# Patient Record
Sex: Male | Born: 1940 | Race: Black or African American | Hispanic: No | Marital: Single | Smoking: Former smoker
Health system: Southern US, Community
[De-identification: ages and names within clinical notes are randomized; demographics above are authoritative.]

## PROBLEM LIST (undated history)

## (undated) DIAGNOSIS — K219 Gastro-esophageal reflux disease without esophagitis: Secondary | ICD-10-CM

## (undated) DIAGNOSIS — G459 Transient cerebral ischemic attack, unspecified: Secondary | ICD-10-CM

## (undated) DIAGNOSIS — R519 Headache, unspecified: Secondary | ICD-10-CM

## (undated) DIAGNOSIS — R531 Weakness: Secondary | ICD-10-CM

## (undated) DIAGNOSIS — R413 Other amnesia: Secondary | ICD-10-CM

## (undated) DIAGNOSIS — C20 Malignant neoplasm of rectum: Secondary | ICD-10-CM

## (undated) DIAGNOSIS — R51 Headache: Secondary | ICD-10-CM

## (undated) DIAGNOSIS — I10 Essential (primary) hypertension: Secondary | ICD-10-CM

## (undated) DIAGNOSIS — I2510 Atherosclerotic heart disease of native coronary artery without angina pectoris: Secondary | ICD-10-CM

## (undated) DIAGNOSIS — M1990 Unspecified osteoarthritis, unspecified site: Secondary | ICD-10-CM

## (undated) HISTORY — DX: Malignant neoplasm of rectum: C20

## (undated) HISTORY — DX: Weakness: R53.1

## (undated) HISTORY — DX: Other amnesia: R41.3

## (undated) HISTORY — DX: Headache: R51

## (undated) HISTORY — PX: OTHER SURGICAL HISTORY: SHX169

## (undated) HISTORY — DX: Headache, unspecified: R51.9

## (undated) HISTORY — DX: Atherosclerotic heart disease of native coronary artery without angina pectoris: I25.10

---

## 1998-07-13 ENCOUNTER — Emergency Department (HOSPITAL_COMMUNITY): Admission: EM | Admit: 1998-07-13 | Discharge: 1998-07-13 | Payer: Self-pay | Admitting: Emergency Medicine

## 1998-07-13 ENCOUNTER — Encounter: Payer: Self-pay | Admitting: Emergency Medicine

## 1998-07-18 ENCOUNTER — Emergency Department (HOSPITAL_COMMUNITY): Admission: EM | Admit: 1998-07-18 | Discharge: 1998-07-18 | Payer: Self-pay | Admitting: Emergency Medicine

## 1998-07-18 ENCOUNTER — Encounter: Payer: Self-pay | Admitting: Emergency Medicine

## 2007-07-31 ENCOUNTER — Encounter: Admission: RE | Admit: 2007-07-31 | Discharge: 2007-07-31 | Payer: Self-pay | Admitting: Gastroenterology

## 2007-08-23 ENCOUNTER — Ambulatory Visit (HOSPITAL_COMMUNITY): Admission: RE | Admit: 2007-08-23 | Discharge: 2007-08-23 | Payer: Self-pay | Admitting: General Surgery

## 2007-08-23 ENCOUNTER — Encounter (INDEPENDENT_AMBULATORY_CARE_PROVIDER_SITE_OTHER): Payer: Self-pay | Admitting: General Surgery

## 2008-04-03 ENCOUNTER — Emergency Department (HOSPITAL_COMMUNITY): Admission: EM | Admit: 2008-04-03 | Discharge: 2008-04-04 | Payer: Self-pay | Admitting: Emergency Medicine

## 2008-11-04 ENCOUNTER — Encounter: Admission: RE | Admit: 2008-11-04 | Discharge: 2008-11-04 | Payer: Self-pay | Admitting: Family Medicine

## 2010-03-06 ENCOUNTER — Emergency Department (HOSPITAL_COMMUNITY): Admission: EM | Admit: 2010-03-06 | Discharge: 2010-03-06 | Payer: Self-pay | Admitting: Emergency Medicine

## 2010-07-03 ENCOUNTER — Encounter: Payer: Self-pay | Admitting: Gastroenterology

## 2010-08-25 LAB — URINALYSIS, ROUTINE W REFLEX MICROSCOPIC
Bilirubin Urine: NEGATIVE
Hgb urine dipstick: NEGATIVE
Ketones, ur: NEGATIVE mg/dL
Nitrite: NEGATIVE
Urobilinogen, UA: 0.2 mg/dL (ref 0.0–1.0)

## 2010-08-25 LAB — CBC
HCT: 41.1 % (ref 39.0–52.0)
MCH: 29 pg (ref 26.0–34.0)
MCHC: 32.5 g/dL (ref 30.0–36.0)
MCV: 89.1 fL (ref 78.0–100.0)
RDW: 15.4 % (ref 11.5–15.5)

## 2010-08-25 LAB — DIFFERENTIAL
Basophils Absolute: 0 10*3/uL (ref 0.0–0.1)
Basophils Relative: 0 % (ref 0–1)
Eosinophils Relative: 4 % (ref 0–5)
Monocytes Absolute: 1 10*3/uL (ref 0.1–1.0)
Monocytes Relative: 15 % — ABNORMAL HIGH (ref 3–12)

## 2010-08-25 LAB — BASIC METABOLIC PANEL
BUN: 5 mg/dL — ABNORMAL LOW (ref 6–23)
CO2: 28 mEq/L (ref 19–32)
Chloride: 105 mEq/L (ref 96–112)
Glucose, Bld: 120 mg/dL — ABNORMAL HIGH (ref 70–99)
Potassium: 4 mEq/L (ref 3.5–5.1)

## 2010-08-25 LAB — POCT CARDIAC MARKERS: CKMB, poc: 1.8 ng/mL (ref 1.0–8.0)

## 2010-10-25 NOTE — Op Note (Signed)
NAME:  Andre [name], Andre [name]                 ACCOUNT NO.:  [id number]   MEDICAL RECORD NO.:  [id number]          PATIENT TYPE:  AMB   LOCATION:  SDS                          FACILITY:  [facility]   PHYSICIAN:  Cherylynn Ridges, M.D.    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   PROCEDURE:  1. Transanal excision of polypectomy site and retained polyp.  2. Rigid sigmoidoscopy.   SURGEON:  Dr. Lindie Spruce.   ANESTHESIA:  General endotracheal was done in the jackknife prone  position.   COMPLICATIONS:  None.   CONDITION:  Stable.   INDICATIONS FOR OPERATION:  The patient is a 70 year old gentleman who  on routine colonoscopy was found to have an anorectal polyp which was  excised and subsequent pathology demonstrated adenocarcinoma, who comes  in now for re-excision of that area.   FINDINGS:  The patient had slightly pigmented mucosa anteriorly and a  small 3 mm polyp just beyond or proximal to the area of pigmentation,  likely retained polyp from previous excision.   OPERATION:  The patient was taken to the operating room, placed on the  table in supine position.  After adequate endotracheal anesthetic was  administered, he was placed in the jackknife prone position.  The  gluteal cheeks were spread apart and he was prepped and draped in usual  sterile manner using Betadine.   An anal speculum was inserted into the anus and we inspected the area  circumferentially, where the anterior mucosal polyp was noted and also  the pigmentation from previous injection.  We grabbed the area of the  polyp with a Allis clamp and then just beyond it placed a 3-0 chromic  Vicryl stitch which  was used to reapproximate the mucosa after we  excised the area with electrocautery.  Hemostasis was obtained with  electrocautery and also a running locking stitch of 3-0 Vicryl and also  intermittent figure-of-eight simple stitches of 3-0 chromic.  Once the  mucosa was removed and the polyp was noted, we marked the proximal  margin using a Vicryl stitch.  We inspected  for adequate hemostasis.  There was good hemostasis noted.  We placed  dibucaine ointment soaked Gelfoam into the anorectal area with 4x4 gauze  and an ABD pad.  All counts were correct.  The patient was taken to  recovery room in stable condition.      Cherylynn Ridges, M.D.  Electronically Signed     JOW/MEDQ  D:  08/23/2007  T:  08/24/2007  Job:  [id number]

## 2011-02-26 ENCOUNTER — Emergency Department (INDEPENDENT_AMBULATORY_CARE_PROVIDER_SITE_OTHER): Payer: Medicare Other

## 2011-02-26 ENCOUNTER — Emergency Department (HOSPITAL_BASED_OUTPATIENT_CLINIC_OR_DEPARTMENT_OTHER): Payer: Medicare Other

## 2011-02-26 ENCOUNTER — Emergency Department (HOSPITAL_BASED_OUTPATIENT_CLINIC_OR_DEPARTMENT_OTHER)
Admission: EM | Admit: 2011-02-26 | Discharge: 2011-02-26 | Disposition: A | Discharge status: Home / Self Care | Payer: Medicare Other | Attending: Emergency Medicine | Admitting: Emergency Medicine

## 2011-02-26 ENCOUNTER — Encounter: Payer: Self-pay | Admitting: *Deleted

## 2011-02-26 DIAGNOSIS — IMO0002 Reserved for concepts with insufficient information to code with codable children: Secondary | ICD-10-CM

## 2011-02-26 DIAGNOSIS — Z9181 History of falling: Secondary | ICD-10-CM

## 2011-02-26 DIAGNOSIS — W19XXXA Unspecified fall, initial encounter: Secondary | ICD-10-CM

## 2011-02-26 DIAGNOSIS — E119 Type 2 diabetes mellitus without complications: Secondary | ICD-10-CM | POA: Insufficient documentation

## 2011-02-26 DIAGNOSIS — M25569 Pain in unspecified knee: Secondary | ICD-10-CM

## 2011-02-26 DIAGNOSIS — S0003XA Contusion of scalp, initial encounter: Secondary | ICD-10-CM | POA: Insufficient documentation

## 2011-02-26 DIAGNOSIS — F17200 Nicotine dependence, unspecified, uncomplicated: Secondary | ICD-10-CM | POA: Insufficient documentation

## 2011-02-26 DIAGNOSIS — T07XXXA Unspecified multiple injuries, initial encounter: Secondary | ICD-10-CM

## 2011-02-26 DIAGNOSIS — Z8739 Personal history of other diseases of the musculoskeletal system and connective tissue: Secondary | ICD-10-CM | POA: Insufficient documentation

## 2011-02-26 DIAGNOSIS — I679 Cerebrovascular disease, unspecified: Secondary | ICD-10-CM

## 2011-02-26 DIAGNOSIS — G319 Degenerative disease of nervous system, unspecified: Secondary | ICD-10-CM | POA: Insufficient documentation

## 2011-02-26 DIAGNOSIS — I10 Essential (primary) hypertension: Secondary | ICD-10-CM | POA: Insufficient documentation

## 2011-02-26 DIAGNOSIS — S1093XA Contusion of unspecified part of neck, initial encounter: Secondary | ICD-10-CM | POA: Insufficient documentation

## 2011-02-26 DIAGNOSIS — J329 Chronic sinusitis, unspecified: Secondary | ICD-10-CM

## 2011-02-26 HISTORY — DX: Unspecified osteoarthritis, unspecified site: M19.90

## 2011-02-26 HISTORY — DX: Essential (primary) hypertension: I10

## 2011-02-26 MED ORDER — OXYCODONE-ACETAMINOPHEN 5-325 MG PO TABS
2.0000 | ORAL_TABLET | Freq: Once | ORAL | Status: AC
  Administered 2011-02-26: 2 via ORAL
  Filled 2011-02-26: qty 2

## 2011-02-26 MED ORDER — CEPHALEXIN 500 MG PO CAPS: 500.0000 mg | ORAL_CAPSULE | Freq: Four times a day (QID) | ORAL | Status: AC

## 2011-02-26 NOTE — ED Notes (Signed)
Patient fell down a hill yesterday, no loc, hit mouth & R knee, lip swollen, forehead pain

## 2011-02-26 NOTE — ED Provider Notes (Signed)
History     CSN: [id number] Arrival date & time: 02/26/2011  7:33 AM   Chief Complaint  Patient presents with  . Fall     (Include location/radiation/quality/duration/timing/severity/associated sxs/prior treatment) HPI  Andre [name] arises a 70 year old man who fell outside yesterday. He has abrasion and contusion to his nasal bridge and his upper lip. He is also still complaining of some pain in his right knee. He denies loss of consciousness but was drinking alcohol does prior to this event. He complains chiefly of pain in his upper lip. He denies any loss of teeth. He states there was some bleeding of his upper lip yesterday and through the night. His last tetanus shot was approximately one year ago. He currently does not have a headache or lateralized deficits. He has been walking on his knees. Past Medical History  Diagnosis Date  . Arthritis   . Hypertension   . Diabetes mellitus     Reviewed History reviewed. No pertinent past surgical history.  No family history on file.  History  Substance Use Topics  . Smoking status: Current Everyday Smoker  . Smokeless tobacco: Not on file  . Alcohol Use: Yes     the patient was recently released from police custody. He had not used alcohol for 18 months prior to this event and began smoking cigarettes again yesterday.  Review of Systems  All other systems reviewed and are negative.    Allergies  Review of patient's allergies indicates no known allergies.  Home Medications  No current outpatient prescriptions on file.  Physical Exam    BP 139/72  Pulse 91  Temp(Src) 98.1 F (36.7 C) (Oral)  Resp 19  SpO2 96%  Physical Exam Well-developed well-nourished male sitting on the bed who does not appear to be in any acute distress the above vital signs were reviewed and are within normal limits HEENT there is no trauma noted to the scalp or deformity of the skull noted. There is an abrasion over the bridge of the nose. The  external nares are patent. The nasal mucosa is examined and there is no evidence of nasal bone fracture and no septal hematoma. The upper lip is diffusely swollen and tender with palpation. There are 2-3 abrasions which appear to have been consistent with teeth marks on the mucosa. There is no laceration noted underlying these times I attempted to explore it with a Q-tip. The patient has very poor dentition but no appearance of recently lost teeth. The patient has an abrasion is somewhat tender over the right forehead. Eyes pupils are equal round react to light with extraocular movements intact Neck is nontender over the cervical spine he has full active range of movement trachea is midline and carotid pulses are 2+ bilaterally. Chest wall no signs of trauma no crepitus palpated Lungs are clear to auscultation Abdomen is soft nontender no signs of trauma are noted Back exam there is no tenderness to palpation over the cervical thoracic or lumbar spine Extremities there is an abrasion over the right knee with mild tenderness. He is full active range of motion of the right knee. No ligamentous laxity is noted. Dorsal patellas pulses are present and equal bilaterally Neurologically patient is alert and oriented x3 with equal strength throughout. ED Course  Procedures  Results for orders placed during the hospital encounter of 03/06/10  BASIC METABOLIC PANEL      Component Value Range   Sodium 138  135 - 145 (mEq/L)   Potassium 4.0  3.5 - 5.1 (mEq/L)   Chloride 105  96 - 112 (mEq/L)   CO2 28  19 - 32 (mEq/L)   Glucose, Bld 120 (*) 70 - 99 (mg/dL)   BUN 5 (*) 6 - 23 (mg/dL)   Creatinine, Ser 0.45  0.4 - 1.5 (mg/dL)   Calcium 9.5  8.4 - 40.9 (mg/dL)   GFR calc non Af Amer >60  >60 (mL/min)   GFR calc Af Amer    >60 (mL/min)   Value: >60            The eGFR has been calculated     using the MDRD equation.     This calculation has not been     validated in all clinical     situations.      eGFR's persistently     <60 mL/min signify     possible Chronic Kidney Disease.  CBC      Component Value Range   WBC 6.3  4.0 - 10.5 (K/uL)   RBC 4.62  4.22 - 5.81 (MIL/uL)   Hemoglobin 13.4  13.0 - 17.0 (g/dL)   HCT 81.1  91.4 - 78.2 (%)   MCV 89.1  78.0 - 100.0 (fL)   MCH 29.0  26.0 - 34.0 (pg)   MCHC 32.5  30.0 - 36.0 (g/dL)   RDW 95.6  21.3 - 08.6 (%)   Platelets 236  150 - 400 (K/uL)  DIFFERENTIAL      Component Value Range   Neutrophils Relative 38 (*) 43 - 77 (%)   Neutro Abs 2.4  1.7 - 7.7 (K/uL)   Lymphocytes Relative 42  12 - 46 (%)   Lymphs Abs 2.7  0.7 - 4.0 (K/uL)   Monocytes Relative 15 (*) 3 - 12 (%)   Monocytes Absolute 1.0  0.1 - 1.0 (K/uL)   Eosinophils Relative 4  0 - 5 (%)   Eosinophils Absolute 0.3  0.0 - 0.7 (K/uL)   Basophils Relative 0  0 - 1 (%)   Basophils Absolute 0.0  0.0 - 0.1 (K/uL)  URINALYSIS, ROUTINE W REFLEX MICROSCOPIC      Component Value Range   Color, Urine YELLOW  YELLOW    Appearance CLEAR  CLEAR    Specific Gravity, Urine >1.030 (*) 1.005 - 1.030    pH 5.5  5.0 - 8.0    Glucose, UA NEGATIVE  NEGATIVE (mg/dL)   Hgb urine dipstick NEGATIVE  NEGATIVE    Bilirubin Urine NEGATIVE  NEGATIVE    Ketones, ur NEGATIVE  NEGATIVE (mg/dL)   Protein, ur NEGATIVE  NEGATIVE (mg/dL)   Urobilinogen, UA 0.2  0.0 - 1.0 (mg/dL)   Nitrite NEGATIVE  NEGATIVE    Leukocytes, UA    NEGATIVE    Value: NEGATIVE MICROSCOPIC NOT DONE ON URINES WITH NEGATIVE PROTEIN, BLOOD, LEUKOCYTES, NITRITE, OR GLUCOSE <1000 mg/dL.  POCT CARDIAC MARKERS      Component Value Range   Myoglobin, poc 75.0  12 - 200 (ng/mL)   CKMB, poc 1.8  1.0 - 8.0 (ng/mL)   Troponin i, poc <0.05  0.00 - 0.09 (ng/mL)   Comment       Value:            TROPONIN VALUES IN THE RANGE     OF 0.00-0.09 ng/mL SHOW     NO INDICATION OF     MYOCARDIAL INJURY.                PERSISTENTLY INCREASED TROPONIN  VALUES IN THE RANGE OF 0.10-0.24     ng/mL CAN BE SEEN IN:           -UNSTABLE ANGINA            -CONGESTIVE HEART FAILURE           -MYOCARDITIS           -CHEST TRAUMA           -ARRYHTHMIAS           -LATE PRESENTING MI           -COPD       CLINICAL FOLLOW-UP RECOMMENDED.                TROPONIN VALUES >=0.25 ng/mL     INDICATE POSSIBLE MYOCARDIAL     ISCHEMIA. SERIAL TESTING     RECOMMENDED.   No results found.   No diagnosis found.   MDM  Patient is having a CT of the head and maxillofacial bones x-Lavere Shinsky of the right knee.    Ct Head Wo Contrast  02/26/2011  *RADIOLOGY REPORT*  Clinical Data:  The patient fell last night, striking face and head on ground.  No loss of consciousness.  Abrasions, lacerations of nose, face, forehead.  History of hypertension.  CT HEAD WITHOUT CONTRAST CT MAXILLOFACIAL WITHOUT CONTRAST  Technique:  Multidetector CT imaging of the head and maxillofacial structures were performed using the standard protocol without intravenous contrast. Multiplanar CT image reconstructions of the maxillofacial structures were also generated.  Comparison:  04/04/2008  CT HEAD  Findings: There is mild central cortical atrophy.  Periventricular white matter changes are consistent with small vessel disease. Small lacunar infarct is identified within the pons, consistent with chronic process. There is no evidence for hemorrhage, mass lesion, or acute infarction.  Bone windows are unremarkable.  IMPRESSION:  1.  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 3.  No evidence for acute intracranial abnormality.  CT MAXILLOFACIAL  Findings:   The nasal bones, zygomatic arches, temporal mandibular joints are intact.  The mandible, orbits, globes, pterygoid plates are intact.  The visualized portion of the cervical spine is intact but does show significant degenerative change. There is mild mucoperiosteal thickening of the ethmoid sinuses.  IMPRESSION:  1.  No evidence for acute facial fracture. 2.  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 3.   Mild, chronic sinusitis.  Original Report Authenticated By: Patterson Hammersmith, M.D.   Dg Knee Complete 4 Views Right  02/26/2011  *RADIOLOGY REPORT*  Clinical Data: Larey Seat down yesterday, hit right knee  RIGHT KNEE - COMPLETE 4+ VIEW  Comparison: None.  Findings: No fracture or dislocation.  Mild degenerative change primarily involving the medial compartment and patellofemoral joint with joint space loss, subchondral sclerosis and osteophytosis.  No evidence of chondrocalcinosis.  No definite joint effusion. Enthesopathic change of the quadriceps tendon insertion site. Vascular calcifications.  IMPRESSION: 1.  No fracture or dislocation. 2.  Mild degenerative change of the knee.  Original Report Authenticated By: Waynard Reeds, M.D.   Ct Maxillofacial Wo Cm  02/26/2011  *RADIOLOGY REPORT*  Clinical Data:  The patient fell last night, striking face and head on ground.  No loss of consciousness.  Abrasions, lacerations of nose, face, forehead.  History of hypertension.  CT HEAD WITHOUT CONTRAST CT MAXILLOFACIAL WITHOUT CONTRAST  Technique:  Multidetector CT imaging of the head and maxillofacial structures were performed using the standard protocol without intravenous contrast. Multiplanar  CT image reconstructions of the maxillofacial structures were also generated.  Comparison:  04/04/2008  CT HEAD  Findings: There is mild central cortical atrophy.  Periventricular white matter changes are consistent with small vessel disease. Small lacunar infarct is identified within the pons, consistent with chronic process. There is no evidence for hemorrhage, mass lesion, or acute infarction.  Bone windows are unremarkable.  IMPRESSION:  1.  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 3.  No evidence for acute intracranial abnormality.  CT MAXILLOFACIAL  Findings:   The nasal bones, zygomatic arches, temporal mandibular joints are intact.  The mandible, orbits, globes, pterygoid plates are intact.  The  visualized portion of the cervical spine is intact but does show significant degenerative change. There is mild mucoperiosteal thickening of the ethmoid sinuses.  IMPRESSION:  1.  No evidence for acute facial fracture. 2.  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 3.  Mild, chronic sinusitis.  Original Report Authenticated By: Patterson Hammersmith, M.D.     Hilario Quarry, MD 02/26/11 [phone]

## 2011-03-06 LAB — COMPREHENSIVE METABOLIC PANEL
ALT: 50
AST: 47 — ABNORMAL HIGH
Alkaline Phosphatase: 65
CO2: 31
Calcium: 9.5
Chloride: 99
GFR calc Af Amer: >60
GFR calc non Af Amer: >60
Glucose, Bld: 103 — ABNORMAL HIGH
Sodium: 138
Total Bilirubin: 0.8

## 2011-03-06 LAB — CBC
Hemoglobin: 14.9
MCHC: 34.2
RBC: 4.5
WBC: 6.4

## 2011-03-06 LAB — DIFFERENTIAL
Basophils Absolute: 0.1
Basophils Relative: 1
Eosinophils Absolute: 0.1
Eosinophils Relative: 2
Lymphs Abs: 2.2
Neutrophils Relative %: 46

## 2011-03-16 ENCOUNTER — Encounter (HOSPITAL_BASED_OUTPATIENT_CLINIC_OR_DEPARTMENT_OTHER): Payer: Self-pay | Admitting: Family Medicine

## 2011-03-16 ENCOUNTER — Emergency Department (HOSPITAL_BASED_OUTPATIENT_CLINIC_OR_DEPARTMENT_OTHER)
Admission: EM | Admit: 2011-03-16 | Discharge: 2011-03-16 | Disposition: A | Discharge status: Home / Self Care | Payer: Self-pay | Attending: Emergency Medicine | Admitting: Emergency Medicine

## 2011-03-16 DIAGNOSIS — Z79899 Other long term (current) drug therapy: Secondary | ICD-10-CM | POA: Insufficient documentation

## 2011-03-16 DIAGNOSIS — I10 Essential (primary) hypertension: Secondary | ICD-10-CM | POA: Insufficient documentation

## 2011-03-16 DIAGNOSIS — Z8739 Personal history of other diseases of the musculoskeletal system and connective tissue: Secondary | ICD-10-CM | POA: Insufficient documentation

## 2011-03-16 DIAGNOSIS — M542 Cervicalgia: Secondary | ICD-10-CM | POA: Insufficient documentation

## 2011-03-16 DIAGNOSIS — E119 Type 2 diabetes mellitus without complications: Secondary | ICD-10-CM | POA: Insufficient documentation

## 2011-03-16 MED ORDER — HYDROCODONE-ACETAMINOPHEN 5-325 MG PO TABS
1.0000 | ORAL_TABLET | Freq: Once | ORAL | Status: AC
  Administered 2011-03-16: 1 via ORAL
  Filled 2011-03-16: qty 1

## 2011-03-16 MED ORDER — IBUPROFEN 800 MG PO TABS
800.0000 mg | ORAL_TABLET | Freq: Once | ORAL | Status: AC
  Administered 2011-03-16: 800 mg via ORAL
  Filled 2011-03-16: qty 1

## 2011-03-16 MED ORDER — HYDROCODONE-ACETAMINOPHEN 5-325 MG PO TABS: 1.0000 | ORAL_TABLET | Freq: Three times a day (TID) | ORAL | Status: AC | PRN

## 2011-03-16 MED ORDER — DIAZEPAM 5 MG PO TABS
5.0000 mg | ORAL_TABLET | Freq: Once | ORAL | Status: AC
  Administered 2011-03-16: 5 mg via ORAL
  Filled 2011-03-16: qty 1

## 2011-03-16 MED ORDER — DIAZEPAM 5 MG PO TABS: 5.0000 mg | ORAL_TABLET | Freq: Every evening | ORAL | Status: AC | PRN

## 2011-03-16 MED ORDER — IBUPROFEN 800 MG PO TABS: 800.0000 mg | ORAL_TABLET | Freq: Three times a day (TID) | ORAL | Status: AC

## 2011-03-16 NOTE — ED Notes (Addendum)
Pt sts he fell over a stump 2 wks ago and was evaluated afterwards. [name] bilateral neck and shoulder "soreness that won't go away". Pt also c/o bilateral eye discomfort.

## 2011-03-16 NOTE — ED Provider Notes (Signed)
History     CSN: [id number] Arrival date & time: 03/16/2011  8:13 AM  Chief Complaint  Patient presents with  . Neck Pain    (Consider location/radiation/quality/duration/timing/severity/associated sxs/prior treatment) HPI  The patient presents with neck discomfort. If symptoms began approximately 2 weeks ago after a mechanical fall for which he was evaluated in this emergency department with negative imaging. He notes that since the fall he has had persistent discomfort about his entire posterior neck. Discomfort described as a soreness and tightness, not relieved by anything, seemingly worse at night. He denies lightheadedness, nausea, chest pain, shortness of breath, visual changes, extremity numbness weakness or tingling, or any gait changes. He notes that the discomfort is most prominent at night, interfering with his capacity to sleep. Past Medical History  Diagnosis Date  . Arthritis   . Hypertension   . Diabetes mellitus     History reviewed. No pertinent past surgical history.  No family history on file.  History  Substance Use Topics  . Smoking status: Current Everyday Smoker    Types: Cigarettes  . Smokeless tobacco: Not on file  . Alcohol Use: Yes      Review of Systems  Constitutional: Negative for fever and chills.  HENT: Positive for neck stiffness. Negative for sore throat.   Eyes: Negative for visual disturbance.  Respiratory: Negative for shortness of breath.   Cardiovascular: Negative for chest pain.  Gastrointestinal: Negative for nausea and abdominal pain.  Genitourinary: Negative for dysuria.  Musculoskeletal: Negative for myalgias.  Neurological: Negative for headaches.  Psychiatric/Behavioral: Negative.     Allergies  Review of patient's allergies indicates no known allergies.  Home Medications   Current Outpatient Rx  Name Route Sig Dispense Refill  . CARBAMAZEPINE 200 MG PO TABS Oral Take 200 mg by mouth 3 (three) times daily.      .  CHLORTHALIDONE 25 MG PO TABS Oral Take 25 mg by mouth daily.      Marland Kitchen DOCUSATE SODIUM 100 MG PO CAPS Oral Take 100 mg by mouth 2 (two) times daily.      Marland Kitchen GLIPIZIDE 10 MG PO TABS Oral Take 10 mg by mouth 2 (two) times daily before a meal.      . GLUCOSAMINE HCL 750 MG PO TABS Oral Take by mouth.      Marland Kitchen LISINOPRIL 10 MG PO TABS Oral Take 10 mg by mouth daily.      Marland Kitchen METFORMIN HCL 500 MG PO TABS Oral Take 500 mg by mouth 2 (two) times daily with a meal.      . METOPROLOL SUCCINATE 25 MG PO TB24 Oral Take 25 mg by mouth daily.        BP 146/68  Pulse 58  Temp(Src) 97.7 F (36.5 C) (Oral)  Resp 16  Ht 5\' 11"  (1.803 m)  Wt 212 lb (96.163 kg)  BMI 29.57 kg/m2  SpO2 100%  Physical Exam  Constitutional: He is oriented to person, place, and time. He appears well-developed and well-nourished.  HENT:  Head: Normocephalic and atraumatic.  Eyes: Conjunctivae are normal. Pupils are equal, round, and reactive to light.  Neck: Normal range of motion. Neck supple. No thyromegaly present.       No appreciable deformity. Range of motion is appropriate. No lymphadenopathy.  Cardiovascular: Normal rate and regular rhythm.   Pulmonary/Chest: No respiratory distress.  Abdominal: Soft. There is no tenderness.  Musculoskeletal: He exhibits no edema.  Neurological: He is alert and oriented to person, place, and time.  Skin: Skin is warm and dry.  Psychiatric: He has a normal mood and affect.    ED Course  Procedures (including critical care time)  Labs Reviewed - No data to display No results found.   No diagnosis found.    MDM  This elderly gentleman presents with ongoing neck discomfort, which seemingly began following a fall. The patient's physical exam and complaints are most consistent with a musculoskeletal etiology. Absent focal findings on his tire CAT scans, or any restriction to range of motion, or any palpable defect, muscle strain is the most likely cause of his symptoms. He will  receive oral medications and be reassessed. Follow up care was discussed with the patient, and he will be provided information for local clinics.   The patient notes that he is feeling appreciably better. He'll be discharged home with prescriptions for analgesia.     Gerhard Munch, MD 03/16/11 [phone]

## 2011-04-17 ENCOUNTER — Emergency Department (HOSPITAL_COMMUNITY): Payer: Medicare Other

## 2011-04-17 ENCOUNTER — Encounter (HOSPITAL_COMMUNITY): Payer: Self-pay | Admitting: *Deleted

## 2011-04-17 ENCOUNTER — Encounter (HOSPITAL_COMMUNITY): Payer: Self-pay | Admitting: Emergency Medicine

## 2011-04-17 ENCOUNTER — Emergency Department (HOSPITAL_COMMUNITY)
Admission: EM | Admit: 2011-04-17 | Discharge: 2011-04-17 | Disposition: A | Discharge status: Home / Self Care | Payer: Medicare Other | Attending: Emergency Medicine | Admitting: Emergency Medicine

## 2011-04-17 ENCOUNTER — Emergency Department (INDEPENDENT_AMBULATORY_CARE_PROVIDER_SITE_OTHER)
Admission: EM | Admit: 2011-04-17 | Discharge: 2011-04-17 | Disposition: A | Discharge status: Hospice | Payer: Medicare Other | Source: Home / Self Care | Attending: Emergency Medicine | Admitting: Emergency Medicine

## 2011-04-17 ENCOUNTER — Other Ambulatory Visit: Payer: Self-pay

## 2011-04-17 DIAGNOSIS — R079 Chest pain, unspecified: Secondary | ICD-10-CM | POA: Insufficient documentation

## 2011-04-17 DIAGNOSIS — R05 Cough: Secondary | ICD-10-CM | POA: Insufficient documentation

## 2011-04-17 DIAGNOSIS — I10 Essential (primary) hypertension: Secondary | ICD-10-CM | POA: Insufficient documentation

## 2011-04-17 DIAGNOSIS — R059 Cough, unspecified: Secondary | ICD-10-CM | POA: Insufficient documentation

## 2011-04-17 DIAGNOSIS — M542 Cervicalgia: Secondary | ICD-10-CM

## 2011-04-17 DIAGNOSIS — E119 Type 2 diabetes mellitus without complications: Secondary | ICD-10-CM | POA: Insufficient documentation

## 2011-04-17 MED ORDER — CYCLOBENZAPRINE HCL 10 MG PO TABS: 10.0000 mg | ORAL_TABLET | Freq: Three times a day (TID) | ORAL | Status: DC | PRN

## 2011-04-17 MED ORDER — IBUPROFEN 800 MG PO TABS: 800.0000 mg | ORAL_TABLET | Freq: Three times a day (TID) | ORAL | Status: AC

## 2011-04-17 MED ORDER — CYCLOBENZAPRINE HCL 10 MG PO TABS: 10.0000 mg | ORAL_TABLET | Freq: Two times a day (BID) | ORAL | Status: AC | PRN

## 2011-04-17 MED ORDER — IBUPROFEN 800 MG PO TABS: 800.0000 mg | ORAL_TABLET | Freq: Three times a day (TID) | ORAL | Status: DC

## 2011-04-17 NOTE — ED Notes (Signed)
Pt on stretcher, nad noted, abc intact, resp e/u on stretcher

## 2011-04-17 NOTE — ED Provider Notes (Addendum)
History     CSN: [id number] Arrival date & time: 04/17/2011  8:21 AM   First MD Initiated Contact with Patient 04/17/11 [phone]      Chief Complaint  Patient presents with  . Chest Pain    (Consider location/radiation/quality/duration/timing/severity/associated sxs/prior treatment) Patient is a 70 y.o. male presenting with chest pain. The history is provided by the patient. History Limited By: POOR HISTORIAN-  Chest Pain The chest pain began more  than 1 month ago. Chest pain occurs constantly. The pain is associated with coughing. At its most intense, the pain is at 5/10. The pain is currently at 5/10. The severity of the pain is moderate. The quality of the pain is described as aching and dull. The pain does not radiate. Primary symptoms include cough and palpitations. Pertinent negatives for primary symptoms include no fever, no shortness of breath, no nausea, no vomiting and no dizziness.  The palpitations did not occur with dizziness or shortness of breath.  Pertinent negatives for associated symptoms include no numbness and no weakness. He tried nothing for the symptoms. Risk factors include smoking/tobacco exposure, alcohol intake and lack of exercise.  His family medical history is significant for CAD in family.     Past Medical History  Diagnosis Date  . Arthritis   . Hypertension   . Diabetes mellitus     History reviewed. No pertinent past surgical history.  Family History  Problem Relation Age of Onset  . Hypertension Other     History  Substance Use Topics  . Smoking status: Current Everyday Smoker    Types: Cigarettes  . Smokeless tobacco: Not on file  . Alcohol Use: Yes     heavy drinker, last alcohol was yesterday am      Review of Systems  Constitutional: Negative for fever and unexpected weight change.  Respiratory: Positive for cough. Negative for shortness of breath.   Cardiovascular: Positive for chest pain and palpitations. Negative for leg  swelling.  Gastrointestinal: Negative for nausea and vomiting.  Skin: Negative for pallor.  Neurological: Negative for dizziness, weakness and numbness.    Allergies  Review of patient's allergies indicates no known allergies.  Home Medications   Current Outpatient Rx  Name Route Sig Dispense Refill  . GLUCOSAMINE HCL 750 MG PO TABS Oral Take by mouth.      Marland Kitchen LISINOPRIL 10 MG PO TABS Oral Take 10 mg by mouth daily.      Marland Kitchen METOPROLOL SUCCINATE 25 MG PO TB24 Oral Take 25 mg by mouth daily.      Marland Kitchen CARBAMAZEPINE 200 MG PO TABS Oral Take 200 mg by mouth 3 (three) times daily.      . CHLORTHALIDONE 25 MG PO TABS Oral Take 25 mg by mouth daily.      Marland Kitchen DOCUSATE SODIUM 100 MG PO CAPS Oral Take 100 mg by mouth 2 (two) times daily.      Marland Kitchen GLIPIZIDE 10 MG PO TABS Oral Take 10 mg by mouth 2 (two) times daily before a meal.      . METFORMIN HCL 500 MG PO TABS Oral Take 500 mg by mouth 2 (two) times daily with a meal.        BP 146/88  Pulse 80  Temp(Src) 97 F (36.1 C) (Oral)  Resp 18  SpO2 98%  Physical Exam  Constitutional: He appears well-developed. No distress.  Neck: Normal range of motion. No JVD present. No tracheal deviation present. No thyromegaly present.  Cardiovascular: Intact distal pulses.  An irregular rhythm present. Exam reveals no gallop and no friction rub.   No murmur heard.      IRREGULAR RHYTHM  Pulmonary/Chest: Effort normal. No respiratory distress. He has no wheezes. He has no rales.  Lymphadenopathy:    He has no cervical adenopathy.  Skin: Skin is warm. He is not diaphoretic.    ED Course  Procedures (including critical care time)  Labs Reviewed - No data to display No results found.   1. Chest pain       MDM  Patient with clinical and electrocardiographic PVC's- and symptomatic- needs further evaluation in the ED        [facility] Tibor Lemmons 04/17/11 0905  Freada Bergeron Rossie Scarfone 04/17/11 1456

## 2011-04-17 NOTE — ED Notes (Signed)
Patient is resting comfortably. 

## 2011-04-17 NOTE — ED Provider Notes (Signed)
History     CSN: [id number] Arrival date & time: 04/17/2011 10:26 AM   First MD Initiated Contact with Patient 04/17/11 1307      Chief Complaint  Patient presents with  . Chest Pain    (Consider location/radiation/quality/duration/timing/severity/associated sxs/prior treatment) Chest Pain Primary symptoms include cough. Pertinent negatives for primary symptoms include no fever, no shortness of breath, no wheezing, no palpitations, no abdominal pain, no nausea and no vomiting.  Pertinent negatives for associated symptoms include no diaphoresis, no numbness and no weakness.   Patient is a poor historian. He states that he was at a party outside several weeks ago, and had had several drinks. He fell, striking his right shoulder and right neck and suffering abrasions to his face. He states that since this time, he has had some pain in his right lateral neck as well as in his chest. The pain is not constant, and is described as an aching sensation. He has not noticed any particular aggravating or alleviating factors. He does state that he has had some coughing recently, and has been coughing up a large amount of sputum. He denies any fevers at home, nausea, vomiting, diarrhea, abdominal pain. He additionally denies any associated diaphoresis or dyspnea. He is a smoker. He denies any illicit drug use. He does have a history of hypertension, but denies any history of cardiac or pulmonary medical problems.   Seen at urgent care this morning, and directed to come to the ED for further evaluation and treatment. An EKG conducted there showed sinus rhythm with possible PACs.  Past Medical History  Diagnosis Date  . Arthritis   . Hypertension   . Diabetes mellitus     History reviewed. No pertinent past surgical history.  Family History  Problem Relation Age of Onset  . Hypertension Other     History  Substance Use Topics  . Smoking status: Current Everyday Smoker    Types: Cigarettes  .  Smokeless tobacco: Not on file  . Alcohol Use: Yes     heavy drinker, last alcohol was yesterday am   is a    Review of Systems  Constitutional: Negative for fever, chills, diaphoresis and activity change.  HENT: Positive for neck pain. Negative for ear pain, nosebleeds, rhinorrhea, sneezing, neck stiffness, sinus pressure and tinnitus.   Eyes: Negative for discharge and visual disturbance.  Respiratory: Positive for cough. Negative for chest tightness, shortness of breath, wheezing and stridor.   Cardiovascular: Positive for chest pain. Negative for palpitations and leg swelling.  Gastrointestinal: Negative for nausea, vomiting, abdominal pain and diarrhea.  Musculoskeletal: Negative for myalgias.  Skin: Negative for rash.  Neurological: Negative for speech difficulty, weakness and numbness.  Hematological: Negative.   Psychiatric/Behavioral: Negative.     Allergies  Review of patient's allergies indicates no known allergies.  Home Medications   Current Outpatient Rx  Name Route Sig Dispense Refill  . CHLORTHALIDONE 25 MG PO TABS Oral Take 25 mg by mouth daily.     Marland Kitchen DOCUSATE SODIUM 100 MG PO CAPS Oral Take 100 mg by mouth 2 (two) times daily.     Marland Kitchen GLUCOSAMINE HCL 750 MG PO TABS Oral Take 2 tablets by mouth 2 (two) times daily.     Marland Kitchen LISINOPRIL 10 MG PO TABS Oral Take 10 mg by mouth at bedtime.     Marland Kitchen METOPROLOL SUCCINATE 25 MG PO TB24 Oral Take 25 mg by mouth daily.     Marland Kitchen CARBAMAZEPINE 200 MG PO TABS Oral  Take 200 mg by mouth 2 (two) times daily.      Marland Kitchen GLIPIZIDE 10 MG PO TABS Oral Take 10 mg by mouth 2 (two) times daily before a meal.     . METFORMIN HCL 500 MG PO TABS Oral Take 500 mg by mouth 2 (two) times daily with a meal.       BP 134/81  Pulse 75  Temp(Src) 97.4 F (36.3 C) (Oral)  Resp 18  SpO2 96%  Physical Exam  Constitutional: He is oriented to person, place, and time. He appears well-developed and well-nourished. No distress.  HENT:  Head: Normocephalic  and atraumatic.  Right Ear: External ear normal.  Left Ear: External ear normal.  Nose: Nose normal.  Mouth/Throat: Oropharynx is clear and moist. No oropharyngeal exudate.  Eyes: Conjunctivae are normal. Pupils are equal, round, and reactive to light.  Neck: Normal range of motion. Neck supple. No tracheal deviation present. No thyromegaly present.       Soft tissue tenderness over the lateral neck bilaterally. Tenderness to palpation over the midline of C-spine. No palpable crepitus, step-off, deformity. The pain is moderate.  Cardiovascular: Normal rate and regular rhythm.  Exam reveals no gallop and no friction rub.   No murmur heard. Pulmonary/Chest: Effort normal and breath sounds normal. No respiratory distress. He has no wheezes. He exhibits no tenderness.  Abdominal: Soft. Bowel sounds are normal. He exhibits no distension. There is no tenderness.  Musculoskeletal: Normal range of motion.  Neurological: He is alert and oriented to person, place, and time. No cranial nerve deficit.  Skin: Skin is warm and dry. No rash noted. He is not diaphoretic. No erythema.  Psychiatric: He has a normal mood and affect.    ED Course  Procedures (including critical care time)   Dg Chest 2 View  04/17/2011  *RADIOLOGY REPORT*  Clinical Data: Chest pain, cough, recent fall  CHEST - 2 VIEW  Comparison: 03/06/2010  Findings: Borderline enlargement of cardiac silhouette. Mildly tortuous thoracic aorta. Pulmonary vascularity normal. Minimal subsegmental atelectasis versus scarring left base. Lungs clear. No pleural effusion or pneumothorax. Multilevel endplate spur formation thoracic spine.  IMPRESSION: Minimal left base atelectasis versus scarring. Borderline enlargement of cardiac silhouette. Otherwise negative exam.  Original Report Authenticated By: Lollie Marrow, M.D.   Dg Cervical Spine Complete  04/17/2011  *RADIOLOGY REPORT*  Clinical Data: Neck pain, fell several weeks ago  CERVICAL SPINE -  COMPLETE 4+ VIEW  Comparison: None Correlation:  CT cervical spine 04/04/2008  Findings: Prevertebral soft tissues normal thickness. Disc space narrowing with predominately anterior spur formation C4- C5, C5-C6, C6-C7. Multilevel facet degenerative changes. Uncovertebral spurs encroach upon left C3-C4, C4-C5, and C5-C6 neural foramina as well as right C6-C7 neural foramen. Vertebral body heights maintained without fracture or subluxation. C1-C2 alignment grossly normal for mild head turn. Odontoid process intact.  IMPRESSION: Degenerative disc and facet disease changes of the cervical spine as above. No definite acute bony abnormalities.  Original Report Authenticated By: Lollie Marrow, M.D.     1. Neck pain   2. Chest pain       MDM  Dr. Effie Shy also saw and assessed the patient. Imaging does not show any acute findings such as fracture; it is possible that he has a ligamentous strain that is causing him to continue to have pain in his neck. He will be discharged with analgesics and muscle relaxers for this. His ECG did not appear worrisome for acute findings such as ACS but  did show a few PVCs. He denies feeling any palpitations with this. He was advised and given resources to obtain a PCP for further evaluation and management. He verbalized understanding and agreed to plan.      Grant Fontana, [place] 04/17/11 2236

## 2011-04-17 NOTE — ED Notes (Signed)
Pt sent here from [facility] c/o neck pain and CP x 4-5 weeks; pt sts fell approx 4 weeks ago; pain worse with movment; pt sts recent URI; pt recently released from prison

## 2011-04-17 NOTE — ED Notes (Signed)
Pt here for neck pain, chest pain onset 4-5 weeks ago. Pt is a poor historian.  Fell 4-5 weeks ago while intoxicated, scraped his face and was seen by a provider at another facility.  Pt has continued to have pain in his neck with movement.  States that he has intermittent heaviness in his chest.  He keeps stating he thinks it is from having a cold.  Pt was in prison up to 6 weeks ago.  Has only been taking his blood pressure medicine and arthritis med.  Was supposed to be on diabetes med, but is not taking them.

## 2011-04-17 NOTE — ED Provider Notes (Signed)
Medical screening examination/treatment/procedure(s) were conducted as a shared visit with non-physician practitioner(s) and myself.  I personally evaluated the patient during the encounter. Patient alert and calm. He has right-sided neck pain radiating to the right subclavicular area, persistent for 2 weeks and worse with some movements. He has no radicular symptoms in his arms or legs. He ambulates easily. He injured his back in the fall 4 weeks ago. He has no substernal chest pain, dyspnea, or weakness. On exam, he has near normal range of motion of the neck and upper back. Neurologic exam is nonfocal.  Flint Melter, MD 04/17/11 1506

## 2011-04-17 NOTE — ED Notes (Signed)
nad ntoed, abc itnact pt denies needs, catherine pa has seen pt and awaiting furthur ordrs.

## 2011-04-17 NOTE — ED Notes (Signed)
EKG performed on pt per the request of RN Michail Jewels.

## 2011-04-18 NOTE — ED Provider Notes (Signed)
Medical screening examination/treatment/procedure(s) were conducted as a shared visit with non-physician practitioner(s) and myself.  I personally evaluated the patient during the encounter. He has tenderness to right lateral neck and somewhat decreased range of motion due to pain in this region. Neurologic exam is grossly nonfocal.  Flint Melter, MD 04/18/11 1001

## 2011-05-15 ENCOUNTER — Encounter (HOSPITAL_COMMUNITY): Payer: Self-pay

## 2011-05-15 ENCOUNTER — Emergency Department (HOSPITAL_COMMUNITY): Payer: Medicare Other

## 2011-05-15 ENCOUNTER — Emergency Department (HOSPITAL_COMMUNITY)
Admission: EM | Admit: 2011-05-15 | Discharge: 2011-05-15 | Disposition: A | Discharge status: Home / Self Care | Payer: Medicare Other | Attending: Emergency Medicine | Admitting: Emergency Medicine

## 2011-05-15 ENCOUNTER — Emergency Department (INDEPENDENT_AMBULATORY_CARE_PROVIDER_SITE_OTHER)
Admission: EM | Admit: 2011-05-15 | Discharge: 2011-05-15 | Disposition: A | Discharge status: Nursing Home (Includes ICF) | Payer: Medicare Other | Source: Home / Self Care | Attending: Family Medicine | Admitting: Family Medicine

## 2011-05-15 ENCOUNTER — Other Ambulatory Visit: Payer: Self-pay

## 2011-05-15 DIAGNOSIS — I10 Essential (primary) hypertension: Secondary | ICD-10-CM | POA: Insufficient documentation

## 2011-05-15 DIAGNOSIS — R05 Cough: Secondary | ICD-10-CM | POA: Insufficient documentation

## 2011-05-15 DIAGNOSIS — R059 Cough, unspecified: Secondary | ICD-10-CM | POA: Insufficient documentation

## 2011-05-15 DIAGNOSIS — F17200 Nicotine dependence, unspecified, uncomplicated: Secondary | ICD-10-CM | POA: Insufficient documentation

## 2011-05-15 DIAGNOSIS — R079 Chest pain, unspecified: Secondary | ICD-10-CM

## 2011-05-15 DIAGNOSIS — R072 Precordial pain: Secondary | ICD-10-CM | POA: Insufficient documentation

## 2011-05-15 DIAGNOSIS — E119 Type 2 diabetes mellitus without complications: Secondary | ICD-10-CM | POA: Insufficient documentation

## 2011-05-15 DIAGNOSIS — J189 Pneumonia, unspecified organism: Secondary | ICD-10-CM | POA: Insufficient documentation

## 2011-05-15 LAB — BASIC METABOLIC PANEL WITH GFR
BUN: 9 mg/dL (ref 6–23)
CO2: 32 meq/L (ref 19–32)
Calcium: 9.9 mg/dL (ref 8.4–10.5)
Chloride: 94 meq/L — ABNORMAL LOW (ref 96–112)
Creatinine, Ser: 0.84 mg/dL (ref 0.50–1.35)
GFR calc Af Amer: >90 mL/min
GFR calc non Af Amer: 87 mL/min — ABNORMAL LOW
Glucose, Bld: 137 mg/dL — ABNORMAL HIGH (ref 70–99)
Potassium: 3.5 meq/L (ref 3.5–5.1)
Sodium: 137 meq/L (ref 135–145)

## 2011-05-15 LAB — CBC
Hemoglobin: 17.3 g/dL — ABNORMAL HIGH (ref 13.0–17.0)
MCH: 29.7 pg (ref 26.0–34.0)
MCHC: 33.5 g/dL (ref 30.0–36.0)
Platelets: 251 10*3/uL (ref 150–400)

## 2011-05-15 LAB — CARDIAC PANEL(CRET KIN+CKTOT+MB+TROPI)
CK, MB: 3.4 ng/mL (ref 0.3–4.0)
Relative Index: 1.5 (ref 0.0–2.5)
Total CK: 225 U/L (ref 7–232)
Troponin I: <0.3 ng/mL

## 2011-05-15 LAB — DIFFERENTIAL
Basophils Relative: 0 % (ref 0–1)
Eosinophils Absolute: 0.1 10*3/uL (ref 0.0–0.7)
Monocytes Relative: 10 % (ref 3–12)
Neutrophils Relative %: 41 % — ABNORMAL LOW (ref 43–77)

## 2011-05-15 LAB — POCT I-STAT TROPONIN I: Troponin i, poc: 0 ng/mL (ref 0.00–0.08)

## 2011-05-15 MED ORDER — LISINOPRIL 10 MG PO TABS: 10.0000 mg | ORAL_TABLET | Freq: Every day | ORAL | Status: DC

## 2011-05-15 MED ORDER — CHLORTHALIDONE 25 MG PO TABS: 25.0000 mg | ORAL_TABLET | Freq: Every day | ORAL | Status: DC

## 2011-05-15 MED ORDER — MOXIFLOXACIN HCL 400 MG PO TABS: 400.0000 mg | ORAL_TABLET | Freq: Every day | ORAL | Status: DC

## 2011-05-15 MED ORDER — SODIUM CHLORIDE 0.9 % IV SOLN
Freq: Once | INTRAVENOUS | Status: AC
  Administered 2011-05-15: 250 mL via INTRAVENOUS

## 2011-05-15 MED ORDER — MOXIFLOXACIN HCL IN NACL 400 MG/250ML IV SOLN
400.0000 mg | Freq: Once | INTRAVENOUS | Status: AC
  Administered 2011-05-15: 400 mg via INTRAVENOUS
  Filled 2011-05-15: qty 250

## 2011-05-15 NOTE — ED Notes (Signed)
Pt ambulated to and from restroom without difficulty.   

## 2011-05-15 NOTE — Consult Note (Signed)
Andre [name] is an 70 y.o. male.    PCP: [facility] for acute issues (has seen 3 different providers, last visit April 2011) Chief Complaint:  Substernal chest pain HPI:   70 year old gentleman with a past medical history significant for hypertension  and tobacco use presents with 4 days of  Intermittent substernal  chest pain.  He describes his chest pain as non radiating,  moderate in severity,  exacerbated by nothing relieved by nothing. Associated symptoms include intermittent frontal headache, and cough productive of yellow sputum. He denies associated  shortness of breath, diaphoresis, palpitations, nausea, pain in jaw or left arm.  He admits to sick contacts in the form of 3 grandchildren with cough, congestion, and headache.    He initially presented to urgent care where an EKG was obtained and was significant for T-wave inversions in the inferior leads V1 to V4,  prolonged QTC 491 and no ST elevation. He was then sent to the ED where a chest x-ray was obtained  revealed questionable left lower lobe atelectasis versus infiltrate. The patient was treated with one dose of IV Avelox for possible pneumonia. The ED physician called the family medicine inpatient team for consultation and to set up outpatient follow up.   Past Medical History  Diagnosis Date  . Arthritis   . Hypertension     History reviewed. No pertinent past surgical history.  Family History  Problem Relation Age of Onset  . Hypertension Other    Social History:  reports that he has quit smoking. His smoking use included Cigarettes. He has never used smokeless tobacco. He reports that he drinks alcohol. He reports that he does not use illicit drugs.  Allergies: No Known Allergies  Medications Prior to Admission  Medication Dose Route Frequency Provider Last Rate Last Dose  . 0.9 %  sodium chloride infusion   Intravenous Once Dayton Bailiff, MD 20 mL/hr at 05/15/11 1519 250 mL at 05/15/11 1519  . moxifloxacin  (AVELOX) IVPB 400 mg  400 mg Intravenous Once Fayrene Helper, PA   400 mg at 05/15/11 1520   No current outpatient prescriptions on file as of 05/15/2011.    Results for orders placed during the hospital encounter of 05/15/11 (from the past 48 hour(s))  CARDIAC PANEL(CRET KIN+CKTOT+MB+TROPI)     Status: Normal   Collection Time   05/15/11  1:29 PM      Component Value Range Comment   Total CK 225  7 - 232 (U/L)    CK, MB 3.4  0.3 - 4.0 (ng/mL)    Troponin I <0.30  <0.30 (ng/mL)    Relative Index 1.5  0.0 - 2.5    CBC     Status: Abnormal   Collection Time   05/15/11  1:32 PM      Component Value Range Comment   WBC 7.5  4.0 - 10.5 (K/uL)    RBC 5.83 (*) 4.22 - 5.81 (MIL/uL)    Hemoglobin 17.3 (*) 13.0 - 17.0 (g/dL)    HCT 11.9  14.7 - 82.9 (%)    MCV 88.5  78.0 - 100.0 (fL)    MCH 29.7  26.0 - 34.0 (pg)    MCHC 33.5  30.0 - 36.0 (g/dL)    RDW 56.2  13.0 - 86.5 (%)    Platelets 251  150 - 400 (K/uL)   DIFFERENTIAL     Status: Abnormal   Collection Time   05/15/11  1:32 PM      Component  Value Range Comment   Neutrophils Relative 41 (*) 43 - 77 (%)    Neutro Abs 3.1  1.7 - 7.7 (K/uL)    Lymphocytes Relative 48 (*) 12 - 46 (%)    Lymphs Abs 3.6  0.7 - 4.0 (K/uL)    Monocytes Relative 10  3 - 12 (%)    Monocytes Absolute 0.8  0.1 - 1.0 (K/uL)    Eosinophils Relative 1  0 - 5 (%)    Eosinophils Absolute 0.1  0.0 - 0.7 (K/uL)    Basophils Relative 0  0 - 1 (%)    Basophils Absolute 0.0  0.0 - 0.1 (K/uL)   BASIC METABOLIC PANEL     Status: Abnormal   Collection Time   05/15/11  1:32 PM      Component Value Range Comment   Sodium 137  135 - 145 (mEq/L)    Potassium 3.5  3.5 - 5.1 (mEq/L)    Chloride 94 (*) 96 - 112 (mEq/L)    CO2 32  19 - 32 (mEq/L)    Glucose, Bld 137 (*) 70 - 99 (mg/dL)    BUN 9  6 - 23 (mg/dL)    Creatinine, Ser 2.13  0.50 - 1.35 (mg/dL)    Calcium 9.9  8.4 - 10.5 (mg/dL)    GFR calc non Af Amer 87 (*) >90 (mL/min)    GFR calc Af Amer >90  >90 (mL/min)   POCT  I-STAT TROPONIN I     Status: Normal   Collection Time   05/15/11  3:07 PM      Component Value Range Comment   Troponin i, poc 0.00  0.00 - 0.08 (ng/mL)    Comment 3             Chest x-ray  2 View 05/15/2011 :  Compared to the chest x-ray to obtain one month prior there is no new infiltrate are peribronchial thickening. The left heart border is well-defined. Left hemidiaphragm hemidiaphragm is sharp.  EKG 05/15/11:  Sinus rhythm with rate of 88. Normal axis. T wave inversions in anterior leads V1 through V2. No ST elevation. Slightly prolonged QTC of 491 (up from 467).  Relatively unchanged compared to EKG obtained on 04/17/2011.Marland Kitchen  Review of Systems  Constitutional: Negative for fever, chills, weight loss, malaise/fatigue and diaphoresis.  HENT: Positive for congestion and sore throat. Negative for ear pain.   Eyes: Positive for blurred vision and redness.  Respiratory: Positive for cough and sputum production. Negative for hemoptysis, shortness of breath and wheezing.   Neurological: Positive for headaches. Negative for weakness.    Blood pressure 145/91, pulse 90, temperature 98.2 F (36.8 C), temperature source Oral, resp. rate 12, height 5\' 11"  (1.803 m), weight 212 lb (96.163 kg), SpO2 95.00%. Physical Exam  General appearance: alert, cooperative and no distress Head: Normocephalic, without obvious abnormality, atraumatic Eyes: positive findings: sclera icterus. PERRLA, EOMI. Normal fundus.  Ears: normal TM's and external ear canals both ears Nose: Nares normal. Septum midline. Mucosa normal. No drainage or sinus tenderness., turbinates swollen Throat: lips, mucosa, and tongue normal; teeth and gums normal Neck: no adenopathy, no carotid bruit, no JVD and supple, symmetrical, trachea midline Lungs: diminished breath sounds diffuse. no wheezing, rhonchi, or rales.  Heart: regular rate and rhythm, S1, S2 normal, no murmur, click, rub or gallop Abdomen: soft, non-tender; bowel sounds  normal; no masses,  no organomegaly and abdomen full.  Extremities: extremities normal, atraumatic, no cyanosis or edema Pulses: 2+ and  symmetric Skin:  tinea versicoolor on chest and back.Otherwise skin is intact without lesions  except for a small (5x5 mm)  laceration on his  upper lip.   Assessment/Plan  70 year old male presents with pleuritic chest pain.   1. Chest pain A:    History and physical exam findings consistent with viral illness. Patient has risk factors for ACS but negative troponins and stable EKG findings compared to previous EKGs.  The patient's  chest x-ray is also stable compared to one month ago. P: - discharge to home. - discontinue Avelox. - follow up with [facility] in one to 2 days.  2.  Hypertension A:  Hypertensive with  normal creatinine.  He is out of his home blood pressure medications for 2 months now.  P:  Restart lisinopril and chlorthalidone.  Sam Wunschel 05/15/2011, 5:01 PM

## 2011-05-15 NOTE — ED Notes (Signed)
Patient is resting comfortably. 

## 2011-05-15 NOTE — ED Notes (Signed)
Patient denies pain and is resting comfortably.  

## 2011-05-15 NOTE — ED Notes (Signed)
4 days ago. Generalized chest pain intermittent pressure, occasional dizziness,  Productive yellow cough. Skin is w/d, resp. E/u

## 2011-05-15 NOTE — ED Notes (Signed)
Pt presented to the ED with c/o midsternal chest pain that started 4 days ago off and on. Denies SOB. States that he does not have a primary MD and has run out of his BP meds 12days.

## 2011-05-15 NOTE — ED Provider Notes (Signed)
History     CSN: [id number] Arrival date & time: 05/15/2011  9:02 AM   First MD Initiated Contact with Patient 05/15/11 0919      Chief Complaint  Patient presents with  . Chest Pain    4-5 day hx of substernal chest pain.  Denies diaphoresis, n/v.  Pain goes and comes.     (Consider location/radiation/quality/duration/timing/severity/associated sxs/prior treatment) HPI Comments: Andre [name] presents for evaluation of 4 day hx of central, substernal chest pressure, sometimes with exertion. He also reports blurred vision, and shortness of breath. He has hx of HTN, without known CAD, and has not had his antihypertensive medication in 10 days. He also has reported hx of "borderline diabetes." He smokes, consumes alcohol, and has + family hx of CAD. He was recently released from prison in September.   Patient is a 70 y.o. male presenting with chest pain. The history is provided by the patient.  Chest Pain The chest pain began 3 - 5 days ago. Chest pain occurs intermittently. The chest pain is unchanged. The pain is associated with exertion. The quality of the pain is described as aching, dull, pressure-like and heavy. The pain does not radiate. Chest pain is worsened by exertion. Primary symptoms include shortness of breath and dizziness. Pertinent negatives for primary symptoms include no syncope, no cough, no palpitations, no nausea and no vomiting.  Dizziness does not occur with nausea, vomiting or weakness.  Pertinent negatives for associated symptoms include no numbness and no weakness. Risk factors include alcohol intake, being elderly, lack of exercise, smoking/tobacco exposure and male gender.  His past medical history is significant for hypertension.  His family medical history is significant for CAD in family.  Procedure history is negative for cardiac catheterization.     Past Medical History  Diagnosis Date  . Arthritis   . Hypertension   . Diabetes mellitus     History  reviewed. No pertinent past surgical history.  Family History  Problem Relation Age of Onset  . Hypertension Other     History  Substance Use Topics  . Smoking status: Current Everyday Smoker    Types: Cigarettes  . Smokeless tobacco: Not on file  . Alcohol Use: Yes     heavy drinker, last alcohol was yesterday am      Review of Systems  Constitutional: Negative.   HENT: Negative.   Eyes: Positive for visual disturbance.  Respiratory: Positive for shortness of breath. Negative for cough.   Cardiovascular: Positive for chest pain. Negative for palpitations and syncope.  Gastrointestinal: Negative.  Negative for nausea and vomiting.  Genitourinary: Negative.   Musculoskeletal: Negative.   Skin: Negative.   Neurological: Positive for dizziness. Negative for weakness and numbness.    Allergies  Review of patient's allergies indicates no known allergies.  Home Medications   Current Outpatient Rx  Name Route Sig Dispense Refill  . CARBAMAZEPINE 200 MG PO TABS Oral Take 200 mg by mouth 2 (two) times daily.      . CHLORTHALIDONE 25 MG PO TABS Oral Take 25 mg by mouth daily.     Marland Kitchen DOCUSATE SODIUM 100 MG PO CAPS Oral Take 100 mg by mouth 2 (two) times daily.     Marland Kitchen GLIPIZIDE 10 MG PO TABS Oral Take 10 mg by mouth 2 (two) times daily before a meal.     . GLUCOSAMINE HCL 750 MG PO TABS Oral Take 2 tablets by mouth 2 (two) times daily.     Marland Kitchen LISINOPRIL 10 MG  PO TABS Oral Take 10 mg by mouth at bedtime.     Marland Kitchen METFORMIN HCL 500 MG PO TABS Oral Take 500 mg by mouth 2 (two) times daily with a meal.     . METOPROLOL SUCCINATE ER 25 MG PO TB24 Oral Take 25 mg by mouth daily.       BP 132/90  Pulse 72  Temp(Src) 98.1 F (36.7 C) (Oral)  Resp 18  SpO2 100%  Physical Exam  Constitutional: He is oriented to person, place, and time. He appears well-developed and well-nourished.  HENT:  Head: Normocephalic and atraumatic.  Eyes: EOM are normal.  Neck: Normal range of motion.    Cardiovascular: Normal rate and normal heart sounds.  An irregular rhythm present.  Pulmonary/Chest: Effort normal.  Neurological: He is alert and oriented to person, place, and time.  Skin: Skin is warm and dry.    ED Course  Procedures (including critical care time)  Labs Reviewed - No data to display No results found.   No diagnosis found.    MDM  ECG: sinus rhythm, rate 83, TWI in lateral leads, prolonged QT; transfer to ED        Richardo Priest, MD 05/15/11 [id number]

## 2011-05-15 NOTE — ED Provider Notes (Signed)
History    this is a 70 year old gentleman is presenting to the ED with chief complaints of intermittent chest pain for the past 4 days. Patient states he has been having sharp, substernal chest discomfort lasting several minutes intermittently. He denies exertional component. He denies headache, fever, shortness of breath, nausea, vomiting, diaphoresis. He has been having productive cough but does admits to smoking cigarettes which he recent resume in the past few months. Does admit to consuming alcohol, and has family history of heart problems. He was initially seen at the urgent care for this complaint and was sent to ED for further evaluation.  Pt has no prior cardiac workup.    CSN: [id number] Arrival date & time: 05/15/2011 10:07 AM   First MD Initiated Contact with Patient 05/15/11 1122      Chief Complaint  Patient presents with  . Chest Pain    chest pain for 4 days, sent from Urgent Care    (Consider location/radiation/quality/duration/timing/severity/associated sxs/prior treatment) HPI  Past Medical History  Diagnosis Date  . Arthritis   . Hypertension   . Diabetes mellitus     History reviewed. No pertinent past surgical history.  Family History  Problem Relation Age of Onset  . Hypertension Other     History  Substance Use Topics  . Smoking status: Former Smoker    Types: Cigarettes  . Smokeless tobacco: Never Used  . Alcohol Use: Yes     heavy drinker, last alcohol was yesterday am      Review of Systems  All other systems reviewed and are negative.    Allergies  Review of patient's allergies indicates no known allergies.  Home Medications   Current Outpatient Rx  Name Route Sig Dispense Refill  . ATENOLOL 25 MG PO TABS Oral Take 25 mg by mouth daily.        BP 158/83  Pulse 83  Temp(Src) 98.2 F (36.8 C) (Oral)  Resp 14  Ht 5\' 11"  (1.803 m)  Wt 212 lb (96.163 kg)  BMI 29.57 kg/m2  SpO2 99%  Physical Exam  Nursing note and vitals  reviewed. Constitutional: He is oriented to person, place, and time.       Awake, alert, nontoxic appearance  HENT:  Head: Atraumatic.  Eyes: Right eye exhibits no discharge. Left eye exhibits no discharge.  Neck: Neck supple.  Cardiovascular: Normal heart sounds.  An irregular rhythm present. Exam reveals no gallop and no friction rub.   No murmur heard. Pulmonary/Chest: Effort normal. No respiratory distress. He has no wheezes. He has no rales. He exhibits no tenderness.  Abdominal: Soft. Bowel sounds are normal. There is no tenderness. There is no rebound.  Musculoskeletal: He exhibits no tenderness.       Baseline ROM, no obvious new focal weakness  Neurological: He is alert and oriented to person, place, and time.       Mental status and motor strength appears baseline for patient and situation  Skin: No rash noted.  Psychiatric: He has a normal mood and affect.    ED Course  Procedures (including critical care time)  Labs Reviewed - No data to display No results found.   No diagnosis found.   Date: 05/15/2011  Rate: 88  Rhythm: sinus arrhythmia  QRS Axis: normal  Intervals: QT prolonged  ST/T Wave abnormalities: nonspecific T wave changes  Conduction Disutrbances:PAC  Narrative Interpretation: nonspecific ST and T wave abnormality.  Sinus rhythm w/ PAC  Old EKG Reviewed: unchanged  MDM  Elderly gentleman with chief complaints of atypical chest pain with several cardiac risk factors. I didn't appreciate a significant changes in his EKG. However, he has no prior cardiac workup and would likely benefit from further evaluation. Chest and workup initiated. Patient is currently chest pain-free.     2:17 PM X-ray demonstrate an area of opacity concerning for pneumonia. This is consistent with the location of his chest pain, and he admits to having productive sputum.  Avelox IV given. I will obtain a delta troponin, and will call for admission.  Is a hemoglobin of 17.3  and hematocrit of 51.6.  He is a smoker   3:17 PM Status with family practice in a request for them to see the patient in ED. Patient will be further evaluated by Dr. Juanna Cao as pt may need further management outpatient for his pna and further cardiac work up.  Discuss care with my attending, who agrees with plan.   Fayrene Helper, PA 05/15/11 [phone]

## 2011-05-15 NOTE — ED Notes (Signed)
Noted pt. To have irregular heart beat on exam.  Denies chest pain at present.  States pain comes and goes.  Pt. Warm and dry no SOB .

## 2011-05-15 NOTE — ED Notes (Signed)
4-5 day hx of substernal chest pain.  Denies diaphoresis, n/v.  Pain goes and comes. No radiation of pain.  Feels SOB at times.  Has coughing episodes at times with white production.  No cardiac hx.

## 2011-05-15 NOTE — ED Provider Notes (Signed)
Medical screening examination/treatment/procedure(s) were conducted as a shared visit with non-physician practitioner(s) and myself.  I personally evaluated the patient during the encounter  70 year old male history of hypertension borderline diabetes presents with intermittent chest pain for the past 4 days. Has intermittent sharp chest pain with associated productive cough. Seen at urgent care and sent here for further evaluation. He recently left a prison and has no primary care physician  Slightly reproducible pain on palpation of the left parasternal region. Lungs are clear. Heart was regular rate and rhythm.  Possible pneumonia on his chest x-ray. This may be the cause of his chest pain however given the lack of primary care physician and no recent cardiac workup I feel we should discuss the case with the unassigned team who will evaluate the patient. They feel he is safe for discharge is fine as long as they have close followup.  Dayton Bailiff, MD 05/15/11 [phone]

## 2011-05-18 ENCOUNTER — Ambulatory Visit (INDEPENDENT_AMBULATORY_CARE_PROVIDER_SITE_OTHER): Payer: Medicare Other

## 2011-05-18 DIAGNOSIS — R059 Cough, unspecified: Secondary | ICD-10-CM

## 2011-05-18 DIAGNOSIS — I491 Atrial premature depolarization: Secondary | ICD-10-CM

## 2011-05-18 DIAGNOSIS — B360 Pityriasis versicolor: Secondary | ICD-10-CM

## 2011-05-18 DIAGNOSIS — R05 Cough: Secondary | ICD-10-CM

## 2011-05-23 ENCOUNTER — Ambulatory Visit (INDEPENDENT_AMBULATORY_CARE_PROVIDER_SITE_OTHER): Payer: Medicare Other

## 2011-05-23 DIAGNOSIS — J40 Bronchitis, not specified as acute or chronic: Secondary | ICD-10-CM

## 2011-05-23 DIAGNOSIS — Z79899 Other long term (current) drug therapy: Secondary | ICD-10-CM

## 2011-05-23 DIAGNOSIS — R079 Chest pain, unspecified: Secondary | ICD-10-CM

## 2011-05-23 DIAGNOSIS — M542 Cervicalgia: Secondary | ICD-10-CM

## 2011-05-29 ENCOUNTER — Ambulatory Visit (INDEPENDENT_AMBULATORY_CARE_PROVIDER_SITE_OTHER): Payer: Medicare Other

## 2011-05-29 DIAGNOSIS — R079 Chest pain, unspecified: Secondary | ICD-10-CM

## 2011-06-02 ENCOUNTER — Ambulatory Visit (INDEPENDENT_AMBULATORY_CARE_PROVIDER_SITE_OTHER): Payer: Medicare Other | Admitting: Cardiovascular Disease

## 2011-06-02 ENCOUNTER — Encounter: Payer: Self-pay | Admitting: Cardiovascular Disease

## 2011-06-02 DIAGNOSIS — R079 Chest pain, unspecified: Secondary | ICD-10-CM | POA: Insufficient documentation

## 2011-06-02 DIAGNOSIS — R002 Palpitations: Secondary | ICD-10-CM | POA: Insufficient documentation

## 2011-06-02 NOTE — Assessment & Plan Note (Signed)
The patient has symptoms consistent with abrupt onset of palpitations. He may be having transient dysrhythmias. He has very frequent premature supraventricular beats on his 12-lead EKG. Recommend a Holter monitor for further evaluation. Note he feels very poorly after taking atenolol and have asked him to discontinue this medication.

## 2011-06-02 NOTE — Progress Notes (Signed)
HPI:  This is a 70 year old gentleman presenting for initial evaluation of chest pain.  The patient has no history of cardiac disease. Over the past 3 months he has been experiencing feelings of a "rolling sensation" in his chest. These come on abruptly and generally occur at rest. He complains of associated shortness of breath and lightheadedness. Episodes last about 2 minutes and then resolve on their own. He denies any exertional symptoms. He was evaluated in the emergency department and those records were reviewed. He ruled out for myocardial infarction. He was also seen at [facility]. The patient was started on atenolol 25 mg daily. He's been taking this for about 3 weeks and states that he feels that after he takes the medicine. He feels very weak and tired. He generally feels better after a few hours. He denies syncope, orthopnea, or PND. He complains of occasional leg swelling at nighttime.  Outpatient Encounter Prescriptions as of 06/02/2011  Medication Sig Dispense Refill  . atenolol (TENORMIN) 25 MG tablet Take 25 mg by mouth daily.        Marland Kitchen lisinopril (PRINIVIL) 10 MG tablet Take 1 tablet (10 mg total) by mouth daily.  30 tablet  0  . DISCONTD: chlorthalidone (HYGROTON) 25 MG tablet Take 1 tablet (25 mg total) by mouth daily.  30 tablet  0    Review of patient's allergies indicates no known allergies.  Past Medical History  Diagnosis Date  . Arthritis   . Hypertension   . Diabetes mellitus     No past surgical history on file.  History   Social History  . Marital Status: Single    Spouse Name: N/A    Number of Children: N/A  . Years of Education: N/A   Occupational History  . Not on file.   Social History Main Topics  . Smoking status: Former Smoker    Types: Cigarettes  . Smokeless tobacco: Never Used  . Alcohol Use: Yes     heavy drinker, last alcohol was yesterday am  . Drug Use: No  . Sexually Active:    Other Topics Concern  . Not on file     Social History Narrative  . No narrative on file    Family History  Problem Relation Age of Onset  . Hypertension Other     ROS: General: no fevers/chills/night sweats Eyes: no blurry vision, diplopia, or amaurosis ENT: no sore throat or hearing loss Resp: no cough, wheezing, or hemoptysis CV: no edema or palpitations GI: no abdominal pain, nausea, vomiting, diarrhea, or constipation GU: no dysuria, frequency, or hematuria Skin: no rash Neuro: no headache, numbness, tingling, or weakness of extremities Musculoskeletal: no joint pain or swelling Heme: no bleeding, DVT, or easy bruising Endo: no polydipsia or polyuria  BP 139/84  Pulse 66  Ht 5\' 11"  (1.803 m)  Wt 93.895 kg (207 lb)  BMI 28.87 kg/m2  PHYSICAL EXAM: Pt is alert and oriented, WD, WN, in no distress. HEENT: normal Neck: JVP normal. Carotid upstrokes normal without bruits. No thyromegaly. Lungs: equal expansion, clear bilaterally CV: Apex is discrete and nondisplaced, RRR without murmur or gallop Abd: soft, NT, +BS, no bruit, no hepatosplenomegaly Back: no CVA tenderness Ext: no C/C/E        Femoral pulses 2+= without bruits        DP/PT pulses intact and = Skin: warm and dry without rash Neuro: CNII-XII intact  Strength intact = bilaterally  EKG:  Sinus rhythm with marked sinus arrhythmia at 79 beats per minute, frequent PACs. No significant ST abnormality but there are nonspecific changes noted.  ASSESSMENT AND PLAN:

## 2011-06-02 NOTE — Patient Instructions (Signed)
Your physician recommends that you schedule a follow-up appointment in: 4-6 weeks.  Your physician has recommended you make the following change in your medication: Stop atenolol.   Your physician has recommended that you wear a holter monitor. Holter monitors are medical devices that record the heart's electrical activity. Doctors most often use these monitors to diagnose arrhythmias. Arrhythmias are problems with the speed or rhythm of the heartbeat. The monitor is a small, portable device. You can wear one while you do your normal daily activities. This is usually used to diagnose what is causing palpitations/syncope (passing out).   Your physician has requested that you have a stress echocardiogram. For further information please visit [email or web address] Please follow instruction sheet as given.

## 2011-06-02 NOTE — Assessment & Plan Note (Signed)
The patient has chest pain with typical and atypical features. He has known cardiac risk factors of hypertension and diabetes. I have recommended that we proceed with an exercise stress echocardiogram to rule out significant ischemic heart disease.

## 2011-06-04 ENCOUNTER — Emergency Department (INDEPENDENT_AMBULATORY_CARE_PROVIDER_SITE_OTHER): Payer: Medicare Other

## 2011-06-04 ENCOUNTER — Emergency Department (INDEPENDENT_AMBULATORY_CARE_PROVIDER_SITE_OTHER)
Admission: EM | Admit: 2011-06-04 | Discharge: 2011-06-04 | Disposition: A | Discharge status: Nursing Home (Includes ICF) | Payer: Medicare Other | Source: Home / Self Care

## 2011-06-04 ENCOUNTER — Other Ambulatory Visit: Payer: Self-pay

## 2011-06-04 ENCOUNTER — Emergency Department (HOSPITAL_COMMUNITY)
Admission: EM | Admit: 2011-06-04 | Discharge: 2011-06-04 | Disposition: A | Discharge status: Home / Self Care | Payer: Medicare Other | Attending: Emergency Medicine | Admitting: Emergency Medicine

## 2011-06-04 ENCOUNTER — Encounter (HOSPITAL_COMMUNITY): Payer: Self-pay | Admitting: *Deleted

## 2011-06-04 ENCOUNTER — Emergency Department (HOSPITAL_COMMUNITY): Payer: Medicare Other

## 2011-06-04 DIAGNOSIS — E119 Type 2 diabetes mellitus without complications: Secondary | ICD-10-CM | POA: Insufficient documentation

## 2011-06-04 DIAGNOSIS — I10 Essential (primary) hypertension: Secondary | ICD-10-CM | POA: Insufficient documentation

## 2011-06-04 DIAGNOSIS — K5900 Constipation, unspecified: Secondary | ICD-10-CM | POA: Insufficient documentation

## 2011-06-04 DIAGNOSIS — R079 Chest pain, unspecified: Secondary | ICD-10-CM | POA: Insufficient documentation

## 2011-06-04 DIAGNOSIS — R109 Unspecified abdominal pain: Secondary | ICD-10-CM | POA: Insufficient documentation

## 2011-06-04 DIAGNOSIS — Z79899 Other long term (current) drug therapy: Secondary | ICD-10-CM | POA: Insufficient documentation

## 2011-06-04 DIAGNOSIS — R160 Hepatomegaly, not elsewhere classified: Secondary | ICD-10-CM

## 2011-06-04 DIAGNOSIS — R011 Cardiac murmur, unspecified: Secondary | ICD-10-CM | POA: Insufficient documentation

## 2011-06-04 DIAGNOSIS — Z9889 Other specified postprocedural states: Secondary | ICD-10-CM | POA: Insufficient documentation

## 2011-06-04 DIAGNOSIS — R1011 Right upper quadrant pain: Secondary | ICD-10-CM

## 2011-06-04 DIAGNOSIS — R0789 Other chest pain: Secondary | ICD-10-CM

## 2011-06-04 DIAGNOSIS — M129 Arthropathy, unspecified: Secondary | ICD-10-CM | POA: Insufficient documentation

## 2011-06-04 DIAGNOSIS — Z85048 Personal history of other malignant neoplasm of rectum, rectosigmoid junction, and anus: Secondary | ICD-10-CM | POA: Insufficient documentation

## 2011-06-04 DIAGNOSIS — Z7901 Long term (current) use of anticoagulants: Secondary | ICD-10-CM | POA: Insufficient documentation

## 2011-06-04 LAB — CBC
Hemoglobin: 16.9 g/dL (ref 13.0–17.0)
MCHC: 34.6 g/dL (ref 30.0–36.0)
WBC: 7.2 10*3/uL (ref 4.0–10.5)

## 2011-06-04 LAB — URINALYSIS, ROUTINE W REFLEX MICROSCOPIC
Glucose, UA: NEGATIVE mg/dL
Hgb urine dipstick: NEGATIVE
Ketones, ur: NEGATIVE mg/dL
Protein, ur: NEGATIVE mg/dL

## 2011-06-04 LAB — COMPREHENSIVE METABOLIC PANEL
AST: 51 U/L — ABNORMAL HIGH (ref 0–37)
Albumin: 4.5 g/dL (ref 3.5–5.2)
Alkaline Phosphatase: 119 U/L — ABNORMAL HIGH (ref 39–117)
BUN: 11 mg/dL (ref 6–23)
Chloride: 92 mEq/L — ABNORMAL LOW (ref 96–112)
Potassium: 4.1 mEq/L (ref 3.5–5.1)
Total Bilirubin: 0.5 mg/dL (ref 0.3–1.2)

## 2011-06-04 LAB — DIFFERENTIAL
Basophils Absolute: 0 10*3/uL (ref 0.0–0.1)
Basophils Relative: 0 % (ref 0–1)
Monocytes Relative: 8 % (ref 3–12)
Neutro Abs: 2.7 10*3/uL (ref 1.7–7.7)
Neutrophils Relative %: 38 % — ABNORMAL LOW (ref 43–77)

## 2011-06-04 LAB — URINE MICROSCOPIC-ADD ON

## 2011-06-04 MED ORDER — IOHEXOL 300 MG/ML  SOLN
112.0000 mL | Freq: Once | INTRAMUSCULAR | Status: AC | PRN
  Administered 2011-06-04: 112 mL via INTRAVENOUS

## 2011-06-04 MED ORDER — SODIUM CHLORIDE 0.9 % IV SOLN
INTRAVENOUS | Status: DC
  Administered 2011-06-04: 15:00:00 via INTRAVENOUS

## 2011-06-04 NOTE — ED Provider Notes (Signed)
Medical screening examination/treatment/procedure(s) were performed by non-physician practitioner and as supervising physician I was immediately available for consultation/collaboration.  Hillery Hunter, MD 06/04/11 [phone]

## 2011-06-04 NOTE — ED Provider Notes (Signed)
History     CSN: [id number]  Arrival date & time 06/04/11  4696   None     Chief Complaint  Patient presents with  . Chest Pain    (Consider location/radiation/quality/duration/timing/severity/associated sxs/prior treatment) HPI Comments: Andre [name] presents with c/o Rt sided chest pain x 2 months. He states the pain is intermittent but occurs daily and he has the pain currently. It is achey pain sometimes sharp. Pain is always Rt chest w/o radiation, dyspnea or diaphoresis. No symptoms with exertion - occurs at rest.  He was seen in the ED on 05-15-11 for same. CXR showed patchy density at Lt base, possible pneumonia and pt was treated for pneumonia. He also saw cardiologist on 06-02-11 and has Stress Echo and Holter Monitor scheduled. He states the pain that he has today is the same pain he has been getting the last 2 months - no change. He denies fever, congestion, cough, dyspnea, N/V or indigestion. He states he does have some pain Rt upper abdomen also and admits to chronic constipation. Last "good" BM was yesterday. He has felt bloated for one month.   Patient is a 70 y.o. male presenting with chest pain.  Chest Pain Primary symptoms include abdominal pain. Pertinent negatives for primary symptoms include no fever, no shortness of breath, no cough, no nausea and no vomiting.     Past Medical History  Diagnosis Date  . Arthritis   . Hypertension   . Diabetes mellitus   . Rectal cancer     Past Surgical History  Procedure Date  . Surgical excision of rectal cancer     Family History  Problem Relation Age of Onset  . Hypertension Other     History  Substance Use Topics  . Smoking status: Former Smoker    Types: Cigarettes  . Smokeless tobacco: Never Used  . Alcohol Use: Yes     heavy drinker, last alcohol was yesterday       Review of Systems  Constitutional: Negative for fever and chills.  HENT: Negative for ear pain, congestion, sore throat and rhinorrhea.     Respiratory: Negative for cough, chest tightness and shortness of breath.   Cardiovascular: Positive for chest pain. Negative for leg swelling.  Gastrointestinal: Positive for abdominal pain, constipation and abdominal distention. Negative for nausea, vomiting and diarrhea.    Allergies  Review of patient's allergies indicates no known allergies.  Home Medications   Current Outpatient Rx  Name Route Sig Dispense Refill  . LISINOPRIL 10 MG PO TABS Oral Take 1 tablet (10 mg total) by mouth daily. 30 tablet 0  . METHOCARBAMOL 750 MG PO TABS Oral Take 750 mg by mouth 3 (three) times daily as needed.      . OXAPROZIN 600 MG PO TABS Oral Take 600 mg by mouth 2 times daily at 12 noon and 4 pm.      . TRAMADOL HCL 50 MG PO TABS Oral Take 50 mg by mouth every 6 (six) hours as needed. Maximum dose= 8 tablets per day       BP 151/84  Pulse 75  Temp(Src) 98.4 F (36.9 C) (Oral)  Resp 16  SpO2 96%  Physical Exam  Nursing note and vitals reviewed. Constitutional: He appears well-developed and well-nourished. No distress.  HENT:  Head: Normocephalic and atraumatic.  Right Ear: Tympanic membrane, external ear and ear canal normal.  Left Ear: Tympanic membrane, external ear and ear canal normal.  Nose: Nose normal.  Mouth/Throat: Uvula is  midline, oropharynx is clear and moist and mucous membranes are normal. No oropharyngeal exudate, posterior oropharyngeal edema or posterior oropharyngeal erythema.  Neck: Neck supple.  Cardiovascular: Normal rate, regular rhythm and normal heart sounds.   Pulmonary/Chest: Effort normal and breath sounds normal. No respiratory distress. He exhibits no tenderness.  Abdominal: Soft. Bowel sounds are normal. He exhibits distension. He exhibits no mass. There is hepatomegaly. There is tenderness in the right lower quadrant. There is guarding. There is no rebound and negative Murphy's sign.  Lymphadenopathy:    He has no cervical adenopathy.  Neurological: He is  alert.  Skin: Skin is warm and dry.  Psychiatric: He has a normal mood and affect.    ED Course  Procedures (including critical care time)  Labs Reviewed - No data to display Dg Chest 2 View  06/04/2011  *RADIOLOGY REPORT*  Clinical Data: Pneumonia, chest pain  CHEST - 2 VIEW  Comparison: 05/15/2011  Findings: Stable minimal linear scarring or atelectasis at the left lung base.  Lungs otherwise clear.  Heart size upper limits normal. Mildly tortuous thoracic aorta.  No effusion.  Spondylitic changes in the thoracic spine.  IMPRESSION:  1.  No acute disease  Original Report Authenticated By: Thora Lance III, M.D.     1. Abdominal pain, RUQ   2. Hepatomegaly   3. Atypical chest pain       MDM  EKG NSR, rate 70, no changes compared to 05-15-11. CXR neg. Patchy density seen 05-15-11 resolved. Pt transferred to [facility] for evaluation of RUQ abd pain with hepatomegaly.         Melody Comas, [place] 06/04/11 1051

## 2011-06-04 NOTE — ED Provider Notes (Signed)
History     CSN: [id number]  Arrival date & time 06/04/11  1102   First MD Initiated Contact with Patient 06/04/11 1202      Chief Complaint  Patient presents with  . Chest Pain    right  . Abdominal Pain    cramping/distention    (Consider location/radiation/quality/duration/timing/severity/associated sxs/prior treatment) The history is provided by the patient.   patient is a 70 year old male seen in [facility]  urgent care and referred here for chest pain and abdominal pain. Patient reports right-sided chest pain on and off for several days. More on than off. He is also reporting left lower quadrant and right upper quadrant abdominal pain. Pain is described as dull and sharp and a questionable mass in the left lower quad. She rates the pain currently a 2/10. The chest pain is currently rated as a 3/10. Patient admits to drinking 4 or 5 days a week . The abdominal discomfort  has been present for 2 months.  Patient denies nausea vomiting or diarrhea but admits to some constipation at times. Past medical history is significant for history of hypertension diabetes mellitus and rectal cancer.  Past Medical History  Diagnosis Date  . Arthritis   . Hypertension   . Diabetes mellitus   . Rectal cancer     Past Surgical History  Procedure Date  . Surgical excision of rectal cancer     Family History  Problem Relation Age of Onset  . Hypertension Other     History  Substance Use Topics  . Smoking status: Former Smoker    Types: Cigarettes  . Smokeless tobacco: Never Used  . Alcohol Use: Yes     heavy drinker, last alcohol was yesterday       Review of Systems  Constitutional: Negative for fever.  HENT: Negative for congestion and neck pain.   Eyes: Negative for pain.  Respiratory: Negative for cough and shortness of breath.   Cardiovascular: Positive for chest pain.  Gastrointestinal: Positive for abdominal pain and constipation. Negative for nausea, vomiting and  diarrhea.  Genitourinary: Negative for dysuria.  Musculoskeletal: Negative for back pain.  Skin: Negative for rash.  Neurological: Negative for headaches.  Hematological: Does not bruise/bleed easily.  Psychiatric/Behavioral: Negative for confusion.    Allergies  Review of patient's allergies indicates no known allergies.  Home Medications   Current Outpatient Rx  Name Route Sig Dispense Refill  . ASPIRIN EC 81 MG PO TBEC Oral Take 81 mg by mouth daily.      Marland Kitchen CARBOXYMETHYLCELLULOSE SODIUM 0.5 % OP SOLN Both Eyes Place 1 drop into both eyes 2 (two) times daily as needed. For dry eyes     . IBUPROFEN 200 MG PO TABS Oral Take 200 mg by mouth every 6 (six) hours as needed. For pain     . LISINOPRIL 10 MG PO TABS Oral Take 1 tablet (10 mg total) by mouth daily. 30 tablet 0  . METHOCARBAMOL 750 MG PO TABS Oral Take 750 mg by mouth 3 (three) times daily as needed. For neck pain/spasms    . OXAPROZIN 600 MG PO TABS Oral Take 600 mg by mouth 2 times daily at 12 noon and 4 pm.      . SYSTANE BALANCE OP Both Eyes Place 1 drop into both eyes 2 (two) times daily.      . TRAMADOL HCL 50 MG PO TABS Oral Take 50 mg by mouth every 6 (six) hours as needed. For pain. Maximum dose= 8  tablets per day      BP 138/88  Pulse 69  Temp(Src) 97.6 F (36.4 C) (Oral)  Resp 18  SpO2 98%  Physical Exam  Nursing note and vitals reviewed. Constitutional: He is oriented to person, place, and time. He appears well-developed and well-nourished.  HENT:  Head: Normocephalic and atraumatic.  Mouth/Throat: Oropharynx is clear and moist.  Eyes: Conjunctivae and EOM are normal. Pupils are equal, round, and reactive to light.  Neck: Normal range of motion. Neck supple.  Cardiovascular: Normal rate and regular rhythm.   Murmur heard. Pulmonary/Chest: Effort normal and breath sounds normal.  Abdominal: Soft. Bowel sounds are normal. There is no tenderness.  Musculoskeletal: Normal range of motion. He exhibits no  edema and no tenderness.  Neurological: He is alert and oriented to person, place, and time. No cranial nerve deficit. He exhibits normal muscle tone. Coordination normal.  Skin: Skin is warm. No rash noted.    ED Course  Procedures (including critical care time)  Labs Reviewed  DIFFERENTIAL - Abnormal; Notable for the following:    Neutrophils Relative 38 (*)    Lymphocytes Relative 52 (*)    All other components within normal limits  COMPREHENSIVE METABOLIC PANEL - Abnormal; Notable for the following:    Sodium 133 (*)    Chloride 92 (*)    Glucose, Bld 100 (*)    Total Protein 9.2 (*)    AST 51 (*)    Alkaline Phosphatase 119 (*)    GFR calc non Af Amer 86 (*)    All other components within normal limits  URINALYSIS, ROUTINE W REFLEX MICROSCOPIC - Abnormal; Notable for the following:    Leukocytes, UA TRACE (*)    All other components within normal limits  CBC  APTT  PROTIME-INR  URINE MICROSCOPIC-ADD ON  POCT I-STAT TROPONIN I  I-STAT TROPONIN I   Dg Chest 2 View  06/04/2011  *RADIOLOGY REPORT*  Clinical Data: Pneumonia, chest pain  CHEST - 2 VIEW  Comparison: 05/15/2011  Findings: Stable minimal linear scarring or atelectasis at the left lung base.  Lungs otherwise clear.  Heart size upper limits normal. Mildly tortuous thoracic aorta.  No effusion.  Spondylitic changes in the thoracic spine.  IMPRESSION:  1.  No acute disease  Original Report Authenticated By: Osa Craver, M.D.   Ct Abdomen Pelvis W Contrast  06/04/2011  *RADIOLOGY REPORT*  Clinical Data: Abdominal pain on the left. History of rectal carcinoma 2 years ago.  Palpable abnormality.  CT ABDOMEN AND PELVIS WITH CONTRAST  Technique:  Multidetector CT imaging of the abdomen and pelvis was performed following the standard protocol during bolus administration of intravenous contrast.  Contrast: OMNIPAQUE IOHEXOL 300 MG/ML IV SOLN  Comparison: CT abdomen and pelvis 11/04/2008.  Findings: The lung  bases are clear.  No pleural or pericardial effusion.  Cardiomegaly is noted.  The liver is diffusely low attenuating consistent with fatty infiltration. A small hyperattenuating focus in the left lobe on image 22 measuring 0.8 cm is unchanged and compatible with a vascular anomaly.  No other focal liver lesion.  The gallbladder, adrenal glands, spleen, pancreas and kidneys appear normal. No lymphadenopathy or fluid is identified.  The stomach, small and large bowel and appendix are unremarkable.  Bones demonstrate an unchanged small sclerotic lesion in the right ilium.  Lower lumbar degenerative change is also identified.  IMPRESSION:  1.  No acute finding or finding to explain the patient's symptoms. 2.  Diffuse fatty  infiltration of the liver.  Original Report Authenticated By: Bernadene Bell. Maricela Curet, M.D.   Results for orders placed during the hospital encounter of 06/04/11  CBC      Component Value Range   WBC 7.2  4.0 - 10.5 (K/uL)   RBC 5.57  4.22 - 5.81 (MIL/uL)   Hemoglobin 16.9  13.0 - 17.0 (g/dL)   HCT 16.1  09.6 - 04.5 (%)   MCV 87.6  78.0 - 100.0 (fL)   MCH 30.3  26.0 - 34.0 (pg)   MCHC 34.6  30.0 - 36.0 (g/dL)   RDW 40.9  81.1 - 91.4 (%)   Platelets 273  150 - 400 (K/uL)  DIFFERENTIAL      Component Value Range   Neutrophils Relative 38 (*) 43 - 77 (%)   Neutro Abs 2.7  1.7 - 7.7 (K/uL)   Lymphocytes Relative 52 (*) 12 - 46 (%)   Lymphs Abs 3.8  0.7 - 4.0 (K/uL)   Monocytes Relative 8  3 - 12 (%)   Monocytes Absolute 0.6  0.1 - 1.0 (K/uL)   Eosinophils Relative 1  0 - 5 (%)   Eosinophils Absolute 0.1  0.0 - 0.7 (K/uL)   Basophils Relative 0  0 - 1 (%)   Basophils Absolute 0.0  0.0 - 0.1 (K/uL)  COMPREHENSIVE METABOLIC PANEL      Component Value Range   Sodium 133 (*) 135 - 145 (mEq/L)   Potassium 4.1  3.5 - 5.1 (mEq/L)   Chloride 92 (*) 96 - 112 (mEq/L)   CO2 29  19 - 32 (mEq/L)   Glucose, Bld 100 (*) 70 - 99 (mg/dL)   BUN 11  6 - 23 (mg/dL)   Creatinine, Ser 7.82  0.50 -  1.35 (mg/dL)   Calcium 95.6  8.4 - 10.5 (mg/dL)   Total Protein 9.2 (*) 6.0 - 8.3 (g/dL)   Albumin 4.5  3.5 - 5.2 (g/dL)   AST 51 (*) 0 - 37 (U/L)   ALT 44  0 - 53 (U/L)   Alkaline Phosphatase 119 (*) 39 - 117 (U/L)   Total Bilirubin 0.5  0.3 - 1.2 (mg/dL)   GFR calc non Af Amer 86 (*) >90 (mL/min)   GFR calc Af Amer >90  >90 (mL/min)  URINALYSIS, ROUTINE W REFLEX MICROSCOPIC      Component Value Range   Color, Urine YELLOW  YELLOW    APPearance CLEAR  CLEAR    Specific Gravity, Urine 1.019  1.005 - 1.030    pH 6.0  5.0 - 8.0    Glucose, UA NEGATIVE  NEGATIVE (mg/dL)   Hgb urine dipstick NEGATIVE  NEGATIVE    Bilirubin Urine NEGATIVE  NEGATIVE    Ketones, ur NEGATIVE  NEGATIVE (mg/dL)   Protein, ur NEGATIVE  NEGATIVE (mg/dL)   Urobilinogen, UA 0.2  0.0 - 1.0 (mg/dL)   Nitrite NEGATIVE  NEGATIVE    Leukocytes, UA TRACE (*) NEGATIVE   APTT      Component Value Range   aPTT 30  24 - 37 (seconds)  PROTIME-INR      Component Value Range   Prothrombin Time 13.2  11.6 - 15.2 (seconds)   INR 0.98  0.00 - 1.49   URINE MICROSCOPIC-ADD ON      Component Value Range   WBC, UA 0-2  <3 (WBC/hpf)   Urine-Other MUCOUS PRESENT    POCT I-STAT TROPONIN I      Component Value Range   Troponin i, poc 0.00  0.00 - 0.08 (ng/mL)   Comment 3             Date: 06/04/2011  Rate: 77  Rhythm: normal sinus rhythm and sinus arrhythmia  QRS Axis: normal  Intervals: normal  ST/T Wave abnormalities: nonspecific ST/T changes  Conduction Disutrbances:none  Narrative Interpretation:   Old EKG Reviewed: unchanged No change from 05/15/2011   1. Chest pain   2. Abdominal pain       MDM    workup negative for acute cardiac event no acute changes on the EKG troponin was negative. CT scan of abdomen without any significant abnormalities. Patient is okay with the results and feels better does not want any medication. He can followup with primary care DrMarland Kitchen        Shelda Jakes,  MD 06/04/11 [phone]

## 2011-06-04 NOTE — ED Notes (Signed)
Pt is here with right sided chest pain and tenderness over right upper quadrant.  Pt sts that he drinks 4-5 days a week.  Pt states abdominal distention for 2 months.  Pt just started having severe left sided cramping to lateral umbilicus area and mass is palpable .  Pt sts he has never had that before.  Pt reports constipation.  Bowel sounds are present

## 2011-06-04 NOTE — ED Notes (Signed)
Pt with c/o right sided chest pain /upper abd pain / intermittently x 2 months / radiates to right side of neck and head - pt seen by  cardiologist 12/21 has stress echo and holter monitor scheduled - per pt pain onset after taking his medications approx after taking meds "grumbling" feeling occurred 630 this morning - remains with mild discomfort  - denies sob -

## 2011-06-04 NOTE — ED Notes (Signed)
Pt is in CT

## 2011-06-04 NOTE — ED Notes (Signed)
PO contrast finished  CT aware

## 2011-06-07 ENCOUNTER — Ambulatory Visit: Payer: Medicare Other | Admitting: Cardiovascular Disease

## 2011-06-07 ENCOUNTER — Ambulatory Visit (INDEPENDENT_AMBULATORY_CARE_PROVIDER_SITE_OTHER): Payer: Medicare Other

## 2011-06-07 DIAGNOSIS — K5732 Diverticulitis of large intestine without perforation or abscess without bleeding: Secondary | ICD-10-CM

## 2011-06-07 DIAGNOSIS — R1084 Generalized abdominal pain: Secondary | ICD-10-CM

## 2011-06-07 DIAGNOSIS — K5900 Constipation, unspecified: Secondary | ICD-10-CM

## 2011-06-08 ENCOUNTER — Other Ambulatory Visit (HOSPITAL_COMMUNITY): Payer: Medicare Other | Admitting: Radiology

## 2011-06-12 ENCOUNTER — Encounter (INDEPENDENT_AMBULATORY_CARE_PROVIDER_SITE_OTHER): Payer: Medicare Other

## 2011-06-12 DIAGNOSIS — R002 Palpitations: Secondary | ICD-10-CM

## 2011-06-13 HISTORY — PX: CARDIAC CATHETERIZATION: SHX172

## 2011-06-14 ENCOUNTER — Other Ambulatory Visit (HOSPITAL_COMMUNITY): Payer: Medicare Other | Admitting: Radiology

## 2011-06-14 ENCOUNTER — Encounter: Payer: Self-pay | Admitting: Cardiovascular Disease

## 2011-06-15 ENCOUNTER — Ambulatory Visit (HOSPITAL_COMMUNITY): Payer: Medicare Other | Attending: Cardiology | Admitting: Radiology

## 2011-06-15 DIAGNOSIS — R0989 Other specified symptoms and signs involving the circulatory and respiratory systems: Secondary | ICD-10-CM

## 2011-06-15 DIAGNOSIS — R079 Chest pain, unspecified: Secondary | ICD-10-CM | POA: Diagnosis not present

## 2011-06-15 DIAGNOSIS — I10 Essential (primary) hypertension: Secondary | ICD-10-CM | POA: Diagnosis not present

## 2011-06-15 DIAGNOSIS — R002 Palpitations: Secondary | ICD-10-CM | POA: Insufficient documentation

## 2011-06-15 DIAGNOSIS — R0609 Other forms of dyspnea: Secondary | ICD-10-CM | POA: Diagnosis not present

## 2011-06-15 DIAGNOSIS — E119 Type 2 diabetes mellitus without complications: Secondary | ICD-10-CM | POA: Diagnosis not present

## 2011-06-15 DIAGNOSIS — R072 Precordial pain: Secondary | ICD-10-CM | POA: Diagnosis not present

## 2011-06-23 ENCOUNTER — Emergency Department (HOSPITAL_COMMUNITY)
Admission: EM | Admit: 2011-06-23 | Discharge: 2011-06-23 | Disposition: A | Discharge status: Home / Self Care | Payer: Medicare Other | Attending: Emergency Medicine | Admitting: Emergency Medicine

## 2011-06-23 ENCOUNTER — Ambulatory Visit (INDEPENDENT_AMBULATORY_CARE_PROVIDER_SITE_OTHER): Payer: Medicare Other

## 2011-06-23 ENCOUNTER — Encounter (HOSPITAL_COMMUNITY): Payer: Self-pay | Admitting: *Deleted

## 2011-06-23 DIAGNOSIS — R079 Chest pain, unspecified: Secondary | ICD-10-CM

## 2011-06-23 DIAGNOSIS — S058X9A Other injuries of unspecified eye and orbit, initial encounter: Secondary | ICD-10-CM | POA: Diagnosis not present

## 2011-06-23 DIAGNOSIS — S0500XA Injury of conjunctiva and corneal abrasion without foreign body, unspecified eye, initial encounter: Secondary | ICD-10-CM

## 2011-06-23 DIAGNOSIS — H5710 Ocular pain, unspecified eye: Secondary | ICD-10-CM | POA: Insufficient documentation

## 2011-06-23 DIAGNOSIS — E119 Type 2 diabetes mellitus without complications: Secondary | ICD-10-CM | POA: Diagnosis not present

## 2011-06-23 DIAGNOSIS — K219 Gastro-esophageal reflux disease without esophagitis: Secondary | ICD-10-CM

## 2011-06-23 DIAGNOSIS — I10 Essential (primary) hypertension: Secondary | ICD-10-CM

## 2011-06-23 DIAGNOSIS — X58XXXA Exposure to other specified factors, initial encounter: Secondary | ICD-10-CM | POA: Insufficient documentation

## 2011-06-23 MED ORDER — FLUORESCEIN SODIUM 1 MG OP STRP
1.0000 | ORAL_STRIP | Freq: Once | OPHTHALMIC | Status: AC
  Administered 2011-06-23: 1 via OPHTHALMIC
  Filled 2011-06-23: qty 1

## 2011-06-23 MED ORDER — TETRACAINE HCL 0.5 % OP SOLN
2.0000 [drp] | Freq: Once | OPHTHALMIC | Status: AC
  Administered 2011-06-23: 2 [drp] via OPHTHALMIC
  Filled 2011-06-23: qty 2

## 2011-06-23 NOTE — ED Notes (Signed)
Left eye examined by Dr. Radford Pax and patch secured to eye by him.  Corneal abrasion found.

## 2011-06-23 NOTE — ED Notes (Signed)
The pt woke up with an irritation in his lt eye.  He feels like there is something innit.  It was not there when he went to bed.  pain

## 2011-06-23 NOTE — ED Provider Notes (Signed)
History     CSN: [id number]  Arrival date & time 06/23/11  0401   First MD Initiated Contact with Patient 06/23/11 0434      Chief Complaint  Patient presents with  . Foreign Body in Eye    (Consider location/radiation/quality/duration/timing/severity/associated sxs/prior treatment) HPI The pt woke up with an irritation in his lt eye. He feels like there is something in it. It was not there when he went to bed. pain  Past Medical History  Diagnosis Date  . Arthritis   . Hypertension   . Diabetes mellitus   . Rectal cancer     Past Surgical History  Procedure Date  . Surgical excision of rectal cancer     Family History  Problem Relation Age of Onset  . Hypertension Other     History  Substance Use Topics  . Smoking status: Former Smoker    Types: Cigarettes  . Smokeless tobacco: Never Used  . Alcohol Use: Yes     heavy drinker, last alcohol was yesterday       Review of Systems  All other systems reviewed and are negative.    Allergies  Review of patient's allergies indicates no known allergies.  Home Medications   Current Outpatient Rx  Name Route Sig Dispense Refill  . ASPIRIN EC 81 MG PO TBEC Oral Take 81 mg by mouth daily.      Marland Kitchen CARBOXYMETHYLCELLULOSE SODIUM 0.5 % OP SOLN Both Eyes Place 1 drop into both eyes 2 (two) times daily as needed. For dry eyes     . IBUPROFEN 200 MG PO TABS Oral Take 200 mg by mouth every 6 (six) hours as needed. For pain     . LISINOPRIL 10 MG PO TABS Oral Take 1 tablet (10 mg total) by mouth daily. 30 tablet 0  . METHOCARBAMOL 750 MG PO TABS Oral Take 750 mg by mouth 3 (three) times daily as needed. For neck pain/spasms    . OXAPROZIN 600 MG PO TABS Oral Take 600 mg by mouth 2 times daily at 12 noon and 4 pm.      . SYSTANE BALANCE OP Both Eyes Place 1 drop into both eyes 2 (two) times daily.      . TRAMADOL HCL 50 MG PO TABS Oral Take 50 mg by mouth every 6 (six) hours as needed. For pain. Maximum dose= 8 tablets per  day      BP 172/90  Pulse 76  Temp(Src) 97.4 F (36.3 C) (Oral)  Resp 20  SpO2 76%  Physical Exam  Nursing note and vitals reviewed. Constitutional: He is oriented to person, place, and time. He appears well-developed and well-nourished. No distress.  HENT:  Head: Normocephalic and atraumatic.  Eyes: Pupils are equal, round, and reactive to light. No foreign bodies found.  Slit lamp exam:      The left eye shows corneal abrasion.    Neck: Normal range of motion.  Cardiovascular: Normal rate and intact distal pulses.   Pulmonary/Chest: No respiratory distress.  Abdominal: Normal appearance. He exhibits no distension.  Musculoskeletal: Normal range of motion.  Neurological: He is alert and oriented to person, place, and time. No cranial nerve deficit.  Skin: Skin is warm and dry. No rash noted.  Psychiatric: He has a normal mood and affect. His behavior is normal.    ED Course  Procedures (including critical care  Eye patch was applied prior to discharge     Labs Reviewed - No data to display  No results found.   1. Corneal abrasion       MDM          Nelia Shi, MD 06/23/11 0500

## 2011-06-25 ENCOUNTER — Ambulatory Visit (INDEPENDENT_AMBULATORY_CARE_PROVIDER_SITE_OTHER): Payer: Medicare Other

## 2011-06-25 DIAGNOSIS — R079 Chest pain, unspecified: Secondary | ICD-10-CM | POA: Diagnosis not present

## 2011-06-25 DIAGNOSIS — I10 Essential (primary) hypertension: Secondary | ICD-10-CM | POA: Diagnosis not present

## 2011-06-25 DIAGNOSIS — R1084 Generalized abdominal pain: Secondary | ICD-10-CM | POA: Diagnosis not present

## 2011-06-26 ENCOUNTER — Emergency Department (HOSPITAL_COMMUNITY)
Admission: EM | Admit: 2011-06-26 | Discharge: 2011-06-26 | Disposition: A | Discharge status: Home / Self Care | Payer: Medicare Other | Attending: Emergency Medicine | Admitting: Emergency Medicine

## 2011-06-26 ENCOUNTER — Encounter (HOSPITAL_COMMUNITY): Payer: Self-pay

## 2011-06-26 ENCOUNTER — Emergency Department (HOSPITAL_COMMUNITY): Payer: Medicare Other

## 2011-06-26 ENCOUNTER — Other Ambulatory Visit: Payer: Self-pay

## 2011-06-26 DIAGNOSIS — E119 Type 2 diabetes mellitus without complications: Secondary | ICD-10-CM | POA: Insufficient documentation

## 2011-06-26 DIAGNOSIS — I10 Essential (primary) hypertension: Secondary | ICD-10-CM | POA: Insufficient documentation

## 2011-06-26 DIAGNOSIS — I517 Cardiomegaly: Secondary | ICD-10-CM | POA: Diagnosis not present

## 2011-06-26 DIAGNOSIS — Z7982 Long term (current) use of aspirin: Secondary | ICD-10-CM | POA: Insufficient documentation

## 2011-06-26 DIAGNOSIS — R079 Chest pain, unspecified: Secondary | ICD-10-CM | POA: Insufficient documentation

## 2011-06-26 DIAGNOSIS — Z85048 Personal history of other malignant neoplasm of rectum, rectosigmoid junction, and anus: Secondary | ICD-10-CM | POA: Insufficient documentation

## 2011-06-26 DIAGNOSIS — Z79899 Other long term (current) drug therapy: Secondary | ICD-10-CM | POA: Insufficient documentation

## 2011-06-26 DIAGNOSIS — M129 Arthropathy, unspecified: Secondary | ICD-10-CM | POA: Insufficient documentation

## 2011-06-26 DIAGNOSIS — R0602 Shortness of breath: Secondary | ICD-10-CM | POA: Insufficient documentation

## 2011-06-26 LAB — CBC
Hemoglobin: 14 g/dL (ref 13.0–17.0)
MCH: 29.1 pg (ref 26.0–34.0)
MCV: 88.6 fL (ref 78.0–100.0)
RBC: 4.81 MIL/uL (ref 4.22–5.81)

## 2011-06-26 LAB — COMPREHENSIVE METABOLIC PANEL
ALT: 23 U/L (ref 0–53)
CO2: 26 mEq/L (ref 19–32)
Calcium: 9.3 mg/dL (ref 8.4–10.5)
Creatinine, Ser: 0.82 mg/dL (ref 0.50–1.35)
GFR calc Af Amer: >90 mL/min (ref >90)
GFR calc non Af Amer: 88 mL/min — ABNORMAL LOW (ref >90)
Glucose, Bld: 107 mg/dL — ABNORMAL HIGH (ref 70–99)
Sodium: 136 mEq/L (ref 135–145)

## 2011-06-26 LAB — APTT: aPTT: 28 seconds (ref 24–37)

## 2011-06-26 LAB — POCT I-STAT TROPONIN I: Troponin i, poc: 0 ng/mL (ref 0.00–0.08)

## 2011-06-26 MED ORDER — SUCRALFATE 1 G PO TABS: 1.0000 g | ORAL_TABLET | Freq: Four times a day (QID) | ORAL | Status: DC

## 2011-06-26 MED ORDER — ASPIRIN 81 MG PO CHEW
324.0000 mg | CHEWABLE_TABLET | Freq: Once | ORAL | Status: AC
  Administered 2011-06-26: 243 mg via ORAL
  Filled 2011-06-26: qty 3

## 2011-06-26 MED ORDER — GI COCKTAIL ~~LOC~~
30.0000 mL | Freq: Once | ORAL | Status: AC
  Administered 2011-06-26: 30 mL via ORAL
  Filled 2011-06-26: qty 30

## 2011-06-26 MED ORDER — SODIUM CHLORIDE 0.9 % IV SOLN
20.0000 mL | INTRAVENOUS | Status: DC
  Administered 2011-06-26: 1000 mL via INTRAVENOUS

## 2011-06-26 NOTE — ED Notes (Signed)
Patient states that last night he had developed some substernal chest pain. The pain subsided enough for him to go to sleep. When he woke up this morning he stated that the pain had returned. He states that the pain is intermittent in nature and he describes it as "running around in his chest". Pt states that he was recently seen by his cardiologist but cannot remember his name at this time. He states that 2 weeks ago he had a stress test completed but states that he was never informed of the results. Pt states that he went to his pcp office yesterday and had an ekg obtained and was told that there were no changes. Pt presents for the persistent pain. Breath sounds are clear and bowel sounds are present. Pt denies any other s/s. Pain does not increase with deep breathing or palpation. Iv started and protocols initiated.

## 2011-06-26 NOTE — ED Provider Notes (Signed)
History     CSN: [id number]  Arrival date & time 06/26/11  0848   First MD Initiated Contact with Patient 06/26/11 [phone]      Chief Complaint  Patient presents with  . Chest Pain  . Shortness of Breath    (Consider location/radiation/quality/duration/timing/severity/associated sxs/prior treatment) Patient is a 71 y.o. male presenting with chest pain and shortness of breath. The history is provided by the patient.  Chest Pain Chest pain occurs intermittently (comes and goes.  Lasts for hours.). The chest pain is unchanged. The severity of the pain is moderate. The pain does not radiate. Primary symptoms include shortness of breath. Pertinent negatives for primary symptoms include no fever, no cough, no wheezing, no nausea and no vomiting.    Shortness of Breath  Associated symptoms include chest pain and shortness of breath. Pertinent negatives include no fever, no cough and no wheezing.   Pt started with chest pain on Saturday night.  He went to the urgent care on Sunday and was released.  He is not sure what they said was the cause.  He was given a prescription for tramadol.  Pt states he stomach and chest are "gunting".  No increase with movement.  Fried foods sometimes upset him.  Some burning in his chest.  Pt had a stress test two weeks ago for this problem.   He was told everything was oK. Past Medical History  Diagnosis Date  . Arthritis   . Hypertension   . Diabetes mellitus   . Rectal cancer     Past Surgical History  Procedure Date  . Surgical excision of rectal cancer     Family History  Problem Relation Age of Onset  . Hypertension Other     History  Substance Use Topics  . Smoking status: Former Smoker    Types: Cigarettes  . Smokeless tobacco: Never Used  . Alcohol Use: Yes     heavy drinker, last alcohol was yesterday       Review of Systems  Constitutional: Negative for fever.  Respiratory: Positive for shortness of breath. Negative for cough and  wheezing.   Cardiovascular: Positive for chest pain.  Gastrointestinal: Negative for nausea, vomiting and diarrhea.    Allergies  Review of patient's allergies indicates no known allergies.  Home Medications   Current Outpatient Rx  Name Route Sig Dispense Refill  . ASPIRIN EC 81 MG PO TBEC Oral Take 81 mg by mouth daily.      Marland Kitchen CIPROFLOXACIN HCL 0.3 % OP SOLN Left Eye Place 1 drop into the left eye See admin instructions. Administer 1 drop, every 2 hours, while awake, for 2 days (last day 06-26-11). Then 1 drop, every 4 hours, while awake, for the next 5 days.    Marland Kitchen LISINOPRIL 10 MG PO TABS Oral Take 1 tablet (10 mg total) by mouth daily. 30 tablet 0  . METHOCARBAMOL 750 MG PO TABS Oral Take 750 mg by mouth 3 (three) times daily as needed. For neck pain/spasms    . METOPROLOL TARTRATE 25 MG PO TABS Oral Take 12.5 mg by mouth 2 (two) times daily.    Marland Kitchen OMEPRAZOLE 40 MG PO CPDR Oral Take 40 mg by mouth daily.    Marland Kitchen OVER THE COUNTER MEDICATION Oral Take 5 mLs by mouth every morning. OTC Powder stool softner. Mixes with 5ml of water.    . TRAMADOL HCL 50 MG PO TABS Oral Take 50 mg by mouth every 4 (four) hours as needed. For  pain. Maximum dose= 8 tablets per day    . CARBOXYMETHYLCELLULOSE SODIUM 0.5 % OP SOLN Both Eyes Place 1 drop into both eyes 2 (two) times daily as needed. For dry eyes     . IBUPROFEN 200 MG PO TABS Oral Take 200 mg by mouth every 6 (six) hours as needed. For pain     . OXAPROZIN 600 MG PO TABS Oral Take 600 mg by mouth 2 times daily at 12 noon and 4 pm.      . SYSTANE BALANCE OP Both Eyes Place 1 drop into both eyes 2 (two) times daily.        BP 163/93  Pulse 58  Temp(Src) 97.4 F (36.3 C) (Oral)  Resp 20  SpO2 98%  Physical Exam  Nursing note and vitals reviewed. Constitutional: He appears well-developed and well-nourished. No distress.  HENT:  Head: Normocephalic and atraumatic.  Right Ear: External ear normal.  Left Ear: External ear normal.  Eyes:  Conjunctivae are normal. Right eye exhibits no discharge. Left eye exhibits no discharge. No scleral icterus.  Neck: Neck supple. No tracheal deviation present.  Cardiovascular: Normal rate, regular rhythm and intact distal pulses.   Pulmonary/Chest: Effort normal and breath sounds normal. No stridor. No respiratory distress. He has no wheezes. He has no rales.  Abdominal: Soft. Bowel sounds are normal. He exhibits no distension. There is no tenderness. There is no rebound and no guarding.  Musculoskeletal: He exhibits no edema and no tenderness.  Neurological: He is alert. He has normal strength. No sensory deficit. Cranial nerve deficit:  no gross defecits noted. He exhibits normal muscle tone. He displays no seizure activity. Coordination normal.  Skin: Skin is warm and dry. No rash noted.  Psychiatric: He has a normal mood and affect.    ED Course  Procedures (including critical care time)  Date: 06/26/2011  Rate: 57  Rhythm: sinus bradycardia  QRS Axis: normal  Intervals: normal  ST/T Wave abnormalities: normal  Conduction Disutrbances:none  Narrative Interpretation: lvh  Old EKG Reviewed: unchanged  Labs Reviewed  COMPREHENSIVE METABOLIC PANEL - Abnormal; Notable for the following:    Glucose, Bld 107 (*)    GFR calc non Af Amer 88 (*)    All other components within normal limits  CBC  PROTIME-INR  APTT  POCT I-STAT TROPONIN I  I-STAT TROPONIN I  I-STAT TROPONIN I  I-STAT TROPONIN I   Dg Chest Portable 1 View  06/26/2011  *RADIOLOGY REPORT*  Clinical Data: Chest pain, shortness of breath.  PORTABLE CHEST - 1 VIEW  Comparison: 06/04/2011.  Findings: There is cardiomegaly.  Minimal left basilar density, atelectasis versus developing infiltrate.  Recommend clinical correlation.  Right lung is clear.  No effusions.  No acute bony abnormality.  IMPRESSION: Stable cardiomegaly.  Left basilar atelectasis or developing infiltrate.  Recommend clinical correlation.  Original Report  Authenticated By: Cyndie Chime, M.D.     Diagnosis: Chest pain   MDM  Old records have been reviewed. Patient has been seen by cardiology. He had a stress echo test that was normal and did not show any signs of ischemia.  Although patient states he noticed the symptoms last night it has been ongoing for an extended period of time.  I doubt acute coronary syndrome. Symptoms are not suggestive of a pulmonary embolism. I doubt aortic dissection. Patient otherwise appears comfortable. He thinks it could be related to acid reflux. I will try adding additional antacids and have him followup with his  doctor this week.        Celene Kras, MD 06/26/11 1058

## 2011-06-26 NOTE — ED Notes (Signed)
Pt states that last night he developed some left sided/substernal chest pain. It had resolved enough for him to sleep but he woke up again with it this morning. Pt states that the pain is a steady ache that he describes as "crawling though him". He states that he feels slightly short of breath. No pain with inspiration or palpation. Alert and oriented. Pt took 81mg  asa pta.

## 2011-07-06 ENCOUNTER — Ambulatory Visit (INDEPENDENT_AMBULATORY_CARE_PROVIDER_SITE_OTHER): Payer: Medicare Other | Admitting: Cardiovascular Disease

## 2011-07-06 ENCOUNTER — Encounter: Payer: Self-pay | Admitting: Cardiovascular Disease

## 2011-07-06 ENCOUNTER — Encounter (INDEPENDENT_AMBULATORY_CARE_PROVIDER_SITE_OTHER): Payer: Medicare Other | Admitting: Physician Assistant

## 2011-07-06 VITALS — BP 160/100 | HR 64 | Ht 71.0 in | Wt 210.1 lb

## 2011-07-06 DIAGNOSIS — R1084 Generalized abdominal pain: Secondary | ICD-10-CM

## 2011-07-06 DIAGNOSIS — R072 Precordial pain: Secondary | ICD-10-CM | POA: Diagnosis not present

## 2011-07-06 DIAGNOSIS — I10 Essential (primary) hypertension: Secondary | ICD-10-CM

## 2011-07-06 DIAGNOSIS — R079 Chest pain, unspecified: Secondary | ICD-10-CM

## 2011-07-06 DIAGNOSIS — I749 Embolism and thrombosis of unspecified artery: Secondary | ICD-10-CM

## 2011-07-06 DIAGNOSIS — G44209 Tension-type headache, unspecified, not intractable: Secondary | ICD-10-CM | POA: Diagnosis not present

## 2011-07-06 LAB — CBC WITH DIFFERENTIAL/PLATELET
Eosinophils Relative: 1.1 % (ref 0.0–5.0)
HCT: 45.6 % (ref 39.0–52.0)
Lymphs Abs: 3.1 10*3/uL (ref 0.7–4.0)
Monocytes Relative: 9.1 % (ref 3.0–12.0)
Neutrophils Relative %: 44 % (ref 43.0–77.0)
Platelets: 233 10*3/uL (ref 150.0–400.0)
WBC: 7 10*3/uL (ref 4.5–10.5)

## 2011-07-06 LAB — PROTIME-INR
INR: 1 ratio (ref 0.8–1.0)
Prothrombin Time: 11.4 s (ref 10.2–12.4)

## 2011-07-06 MED ORDER — AMLODIPINE BESYLATE 5 MG PO TABS: 5.0000 mg | ORAL_TABLET | Freq: Every day | ORAL | Status: DC

## 2011-07-06 NOTE — Patient Instructions (Addendum)
Your physician has requested that you have a cardiac catheterization. Cardiac catheterization is used to diagnose and/or treat various heart conditions. Doctors may recommend this procedure for a number of different reasons. The most common reason is to evaluate chest pain. Chest pain can be a symptom of coronary artery disease (CAD), and cardiac catheterization can show whether plaque is narrowing or blocking your heart's arteries. This procedure is also used to evaluate the valves, as well as measure the blood flow and oxygen levels in different parts of your heart. For further information please visit [email or web address] Please follow instruction sheet, as given.  Your physician recommends that you schedule a follow-up appointment in: 3 WEEKS  Your physician has recommended you make the following change in your medication: START Amlodipine 5mg  take one by mouth daily

## 2011-07-06 NOTE — Progress Notes (Signed)
This encounter was created in error - please disregard.  This encounter was created in error - please disregard.

## 2011-07-06 NOTE — Progress Notes (Signed)
HPI:  71 year-old gentleman returns for follow-up evaluation. Please see my office note from 06/02/11 for details. The patient was evaluated for chest pain and had a stress echo showing poor exercise tolerance but no inducible wall motion abnormalities. Since that evaluation, he has had multiple visits to the Emergency Dept and Urgent Care for chest pain. He continues to have episodic central and left-sided chest pains, unrelated to exertion. The pain is both sharp and dull, nonradiating. He complains of associated dyspnea, headache. No nausea or vomiting. He also complains of abdominal discomfort, unrelated to PO intake.  No facility-administered encounter medications on file as of 07/06/2011.   Outpatient Encounter Prescriptions as of 07/06/2011  Medication Sig Dispense Refill  . Amoxicill-Clarithro-Lansopraz (PREVPAC PO) Take 4 tablets by mouth 2 (two) times daily. For 14 days. Patient started course 7 days ago.      . aspirin EC 81 MG tablet Take 81 mg by mouth daily.        . carboxymethylcellulose (REFRESH PLUS) 0.5 % SOLN Place 1 drop into both eyes 2 (two) times daily as needed. For dry eyes. Morning and afternoon.      . ciprofloxacin (CILOXAN) 0.3 % ophthalmic solution Place 1 drop into the left eye See admin instructions. Administer 1 drop, every 2 hours, while awake, for 2 days (last day 06-26-11). Then 1 drop, every 4 hours, while awake, for the next 5 days.      . ibuprofen (ADVIL,MOTRIN) 200 MG tablet Take 200 mg by mouth every 6 (six) hours as needed. For pain       . lisinopril (PRINIVIL) 10 MG tablet Take 1 tablet (10 mg total) by mouth daily.  30 tablet  0  . methocarbamol (ROBAXIN) 750 MG tablet Take 750 mg by mouth 3 (three) times daily as needed. For neck pain/spasms      . metoprolol tartrate (LOPRESSOR) 25 MG tablet Take 12.5 mg by mouth 2 (two) times daily.      . omeprazole (PRILOSEC) 40 MG capsule Take 40 mg by mouth daily.      . OVER THE COUNTER MEDICATION Take 5 mLs by mouth  every morning. OTC Powder stool softner. Mixes with 5ml of water.      . polyethylene glycol (MIRALAX / GLYCOLAX) packet Take 17 g by mouth daily. Afternoon.      . Propylene Glycol (SYSTANE BALANCE OP) Place 1 drop into the right eye every morning.       . sucralfate (CARAFATE) 1 G tablet Take 1 tablet (1 g total) by mouth 4 (four) times daily.  30 tablet  0  . traMADol (ULTRAM) 50 MG tablet Take 50 mg by mouth every 4 (four) hours as needed. For pain. Maximum dose= 8 tablets per day      . amLODipine (NORVASC) 5 MG tablet Take 1 tablet (5 mg total) by mouth daily.  30 tablet  6    No Known Allergies  Past Medical History  Diagnosis Date  . Arthritis   . Hypertension   . Diabetes mellitus   . Rectal cancer     ROS: Negative except as per HPI  BP 160/100  Pulse 64  Ht 5' 11" (1.803 m)  Wt 95.31 kg (210 lb 1.9 oz)  BMI 29.31 kg/m2  PHYSICAL EXAM: Pt is alert and oriented, NAD HEENT: normal Neck: JVP - normal, carotids 2+= without bruits Lungs: CTA bilaterally CV: RRR without murmur or gallop Abd: soft, NT, Positive BS, no hepatomegaly Ext: no C/C/E,   distal pulses intact and equal Skin: warm/dry no rash  ASSESSMENT AND PLAN:  

## 2011-07-07 ENCOUNTER — Ambulatory Visit: Payer: Medicare Other | Admitting: Cardiovascular Disease

## 2011-07-07 LAB — BASIC METABOLIC PANEL
BUN: 7 mg/dL (ref 6–23)
GFR: 126.73 mL/min (ref >60.00)
Potassium: 3.6 mEq/L (ref 3.5–5.1)

## 2011-07-10 ENCOUNTER — Inpatient Hospital Stay (HOSPITAL_BASED_OUTPATIENT_CLINIC_OR_DEPARTMENT_OTHER)
Admission: RE | Admit: 2011-07-10 | Discharge: 2011-07-10 | Disposition: A | Discharge status: Hospice | Payer: Medicare Other | Source: Ambulatory Visit | Attending: Cardiovascular Disease | Admitting: Cardiovascular Disease

## 2011-07-10 ENCOUNTER — Encounter (HOSPITAL_BASED_OUTPATIENT_CLINIC_OR_DEPARTMENT_OTHER)
Admission: RE | Disposition: A | Discharge status: Hospice | Payer: Self-pay | Source: Ambulatory Visit | Attending: Cardiovascular Disease

## 2011-07-10 DIAGNOSIS — R079 Chest pain, unspecified: Secondary | ICD-10-CM | POA: Diagnosis not present

## 2011-07-10 DIAGNOSIS — I2510 Atherosclerotic heart disease of native coronary artery without angina pectoris: Secondary | ICD-10-CM

## 2011-07-10 DIAGNOSIS — I10 Essential (primary) hypertension: Secondary | ICD-10-CM | POA: Insufficient documentation

## 2011-07-10 SURGERY — JV LEFT HEART CATHETERIZATION WITH CORONARY ANGIOGRAM
Anesthesia: Moderate Sedation

## 2011-07-10 MED ORDER — ONDANSETRON HCL 4 MG/2ML IJ SOLN: 4.0000 mg | Freq: Four times a day (QID) | INTRAMUSCULAR | Status: DC | PRN

## 2011-07-10 MED ORDER — ACETAMINOPHEN 325 MG PO TABS: 650.0000 mg | ORAL_TABLET | ORAL | Status: DC | PRN

## 2011-07-10 MED ORDER — SODIUM CHLORIDE 0.9 % IV SOLN: 1.0000 mL/kg/h | INTRAVENOUS | Status: DC

## 2011-07-10 MED ORDER — SODIUM CHLORIDE 0.9 % IV SOLN
INTRAVENOUS | Status: DC
  Administered 2011-07-10: 09:00:00 via INTRAVENOUS

## 2011-07-10 NOTE — Interval H&P Note (Signed)
History and Physical Interval Note:  07/10/2011 10:14 AM  Andre [name]  has presented today for surgery, with the diagnosis of chest pain  The various methods of treatment have been discussed with the patient and family. After consideration of risks, benefits and other options for treatment, the patient has consented to  Procedure(s): JV LEFT HEART CATHETERIZATION WITH CORONARY ANGIOGRAM as a surgical intervention .  The patients' history has been reviewed, patient examined, no change in status, stable for surgery.  I have reviewed the patients' chart and labs.  Questions were answered to the patient's satisfaction.     [name]

## 2011-07-10 NOTE — Progress Notes (Signed)
Bedrest begins @ 1050.  Dr. Excell Seltzer discussed results with patient and girlfriend.

## 2011-07-10 NOTE — Assessment & Plan Note (Signed)
The patient has continued chest pain with a background of multiple cardiac risk factors. I have recommended cardiac catheterization for definitive evaluation. I feel that considering his frequent medical visits for the same complaint, a definitive evaluation is most prudent and clearly indicated. The risks, indication, and alternatives to cardiac catheterization and possible PCI have been reviewed in detail with the patient.

## 2011-07-10 NOTE — Op Note (Signed)
Cardiac Catheterization Procedure Note  Name: Andre [name] MRN: [id number] DOB: August 03, 1940  Procedure: Left Heart Cath, Selective Coronary Angiography, LV angiography  Indication: 71 year old gentleman presenting for cardiac catheterization. He has multiple cardiac risk factors and has had recurrent chest pain. He was referred for cardiac cath for definitive evaluation.   Procedural details: The right groin was prepped, draped, and anesthetized with 1% lidocaine. Using modified Seldinger technique, a 4 French sheath was introduced into the right femoral artery. Standard Judkins catheters were used for coronary angiography and left ventriculography. Catheter exchanges were performed over a guidewire. There were no immediate procedural complications. The patient was transferred to the post catheterization recovery area for further monitoring.  Procedural Findings: Hemodynamics:  AO 127/68 with a mean of 90 LV 120/12   Coronary angiography: Coronary dominance: right  Left mainstem: The left main stem is widely patent. There is calcification along the superior aspect. The left main trifurcates into the LAD, intermediate branch, and left circumflex.  Left anterior descending (LAD): The LAD is widely patent throughout. The vessel wraps around the left ventricular apex. It supplies a very small first and second diagonal branch. There are diffuse luminal irregularities without significant stenosis present.  Left circumflex (LCx): The circumflex is patent throughout. There is a moderate caliber intermediate branch that divides into twin vessels. There are no significant stenoses. The AV groove circumflex supplies an obtuse marginal branch without significant stenosis.  Right coronary artery (RCA): The RCA has mild calcification. There is 30-40% proximal stenosis present. The vessel is dominant and supplies a right PDA branch without significant stenosis. There are minor luminal irregularities in the  mid and distal RCA.  Left ventriculography: There is mild global LV dysfunction. The left ventricular ejection fraction is estimated at 45-50%.  Final Conclusions:   1. Widely patent coronary arteries with minimal diffuse coronary artery disease 2. Mild global LV systolic dysfunction  Recommendations: Continue medical therapy and treatment of hypertension. The patient's echocardiogram was reviewed and is LV ejection fraction was normal by echo.  [name] 07/10/2011, 10:41 AM

## 2011-07-10 NOTE — Assessment & Plan Note (Signed)
The patient's HTN is not controlled. I have recommended starting amlodipine 5 mg daily. Will reassess when he presents for cath.

## 2011-07-10 NOTE — H&P (View-Only) (Signed)
HPI:  71 year-old gentleman returns for follow-up evaluation. Please see my office note from 06/02/11 for details. The patient was evaluated for chest pain and had a stress echo showing poor exercise tolerance but no inducible wall motion abnormalities. Since that evaluation, he has had multiple visits to the Emergency Dept and Urgent Care for chest pain. He continues to have episodic central and left-sided chest pains, unrelated to exertion. The pain is both sharp and dull, nonradiating. He complains of associated dyspnea, headache. No nausea or vomiting. He also complains of abdominal discomfort, unrelated to PO intake.  No facility-administered encounter medications on file as of 07/06/2011.   Outpatient Encounter Prescriptions as of 07/06/2011  Medication Sig Dispense Refill  . Amoxicill-Clarithro-Lansopraz (PREVPAC PO) Take 4 tablets by mouth 2 (two) times daily. For 14 days. Patient started course 7 days ago.      Marland Kitchen aspirin EC 81 MG tablet Take 81 mg by mouth daily.        . carboxymethylcellulose (REFRESH PLUS) 0.5 % SOLN Place 1 drop into both eyes 2 (two) times daily as needed. For dry eyes. Morning and afternoon.      . ciprofloxacin (CILOXAN) 0.3 % ophthalmic solution Place 1 drop into the left eye See admin instructions. Administer 1 drop, every 2 hours, while awake, for 2 days (last day 06-26-11). Then 1 drop, every 4 hours, while awake, for the next 5 days.      Marland Kitchen ibuprofen (ADVIL,MOTRIN) 200 MG tablet Take 200 mg by mouth every 6 (six) hours as needed. For pain       . lisinopril (PRINIVIL) 10 MG tablet Take 1 tablet (10 mg total) by mouth daily.  30 tablet  0  . methocarbamol (ROBAXIN) 750 MG tablet Take 750 mg by mouth 3 (three) times daily as needed. For neck pain/spasms      . metoprolol tartrate (LOPRESSOR) 25 MG tablet Take 12.5 mg by mouth 2 (two) times daily.      Marland Kitchen omeprazole (PRILOSEC) 40 MG capsule Take 40 mg by mouth daily.      Marland Kitchen OVER THE COUNTER MEDICATION Take 5 mLs by mouth  every morning. OTC Powder stool softner. Mixes with 5ml of water.      . polyethylene glycol (MIRALAX / GLYCOLAX) packet Take 17 g by mouth daily. Afternoon.      Marland Kitchen Propylene Glycol (SYSTANE BALANCE OP) Place 1 drop into the right eye every morning.       . sucralfate (CARAFATE) 1 G tablet Take 1 tablet (1 g total) by mouth 4 (four) times daily.  30 tablet  0  . traMADol (ULTRAM) 50 MG tablet Take 50 mg by mouth every 4 (four) hours as needed. For pain. Maximum dose= 8 tablets per day      . amLODipine (NORVASC) 5 MG tablet Take 1 tablet (5 mg total) by mouth daily.  30 tablet  6    No Known Allergies  Past Medical History  Diagnosis Date  . Arthritis   . Hypertension   . Diabetes mellitus   . Rectal cancer     ROS: Negative except as per HPI  BP 160/100  Pulse 64  Ht 5\' 11"  (1.803 m)  Wt 95.31 kg (210 lb 1.9 oz)  BMI 29.31 kg/m2  PHYSICAL EXAM: Pt is alert and oriented, NAD HEENT: normal Neck: JVP - normal, carotids 2+= without bruits Lungs: CTA bilaterally CV: RRR without murmur or gallop Abd: soft, NT, Positive BS, no hepatomegaly Ext: no C/C/E,  distal pulses intact and equal Skin: warm/dry no rash  ASSESSMENT AND PLAN:

## 2011-07-19 ENCOUNTER — Ambulatory Visit (INDEPENDENT_AMBULATORY_CARE_PROVIDER_SITE_OTHER): Payer: Medicare Other | Admitting: Emergency Medicine

## 2011-07-19 VITALS — BP 121/75 | HR 83 | Temp 98.0°F | Resp 18 | Ht 70.0 in | Wt 202.0 lb

## 2011-07-19 DIAGNOSIS — K219 Gastro-esophageal reflux disease without esophagitis: Secondary | ICD-10-CM

## 2011-07-19 DIAGNOSIS — K5900 Constipation, unspecified: Secondary | ICD-10-CM | POA: Diagnosis not present

## 2011-07-19 DIAGNOSIS — R51 Headache: Secondary | ICD-10-CM

## 2011-07-19 DIAGNOSIS — R1084 Generalized abdominal pain: Secondary | ICD-10-CM | POA: Diagnosis not present

## 2011-07-19 MED ORDER — TRAMADOL HCL 50 MG PO TABS: 50.0000 mg | ORAL_TABLET | ORAL | Status: DC | PRN

## 2011-07-19 NOTE — Progress Notes (Signed)
  Subjective:    Patient ID: Andre [name], male    DOB: 31-Dec-1940, 71 y.o.   MRN: [id number]  HPI patient enters with persistent headache. The m does give him some relief from his headaches but he has to take this for relief. History of trauma to his forehead approximately 5 months ago. His other problem has been persistent abdominal pain to try to get an appointment to see Dr. Audley Hose but was unable to do so. Dr. Haywood Pao office was called and appointment was made for him to be seen on Monday at 3:15.   Review of Systems overall done well no other specific complaints no chest pain at the present time. He did undergo cardiac catheterization because of his persistent dyspepsia and recurrent visits to the emergency room with chest. Dr. Excell Seltzer called me and said his cath was essentially normal     Objective:   Physical Exam physical exam reveals patient alert and cooperative in no distress. 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 His chest was clear. His neurological exam was without focal signs the        Assessment & Plan:  Due to the patient's persistent abdominal discomfort but he is going to be seen by Dr. Elnoria Howard for his evaluation. His Ultram was refilled and help with his headaches. Because it has a history of trauma to his head will schedule a CT of his head to rule out a subdural

## 2011-07-24 DIAGNOSIS — R198 Other specified symptoms and signs involving the digestive system and abdomen: Secondary | ICD-10-CM | POA: Diagnosis not present

## 2011-07-24 DIAGNOSIS — K5900 Constipation, unspecified: Secondary | ICD-10-CM | POA: Diagnosis not present

## 2011-07-24 DIAGNOSIS — R1084 Generalized abdominal pain: Secondary | ICD-10-CM | POA: Diagnosis not present

## 2011-07-25 ENCOUNTER — Ambulatory Visit
Admission: RE | Admit: 2011-07-25 | Discharge: 2011-07-25 | Disposition: A | Discharge status: Home / Self Care | Payer: Medicare Other | Source: Ambulatory Visit | Attending: Emergency Medicine | Admitting: Emergency Medicine

## 2011-07-25 ENCOUNTER — Other Ambulatory Visit: Payer: Self-pay | Admitting: Gastroenterology

## 2011-07-25 DIAGNOSIS — R1084 Generalized abdominal pain: Secondary | ICD-10-CM

## 2011-07-25 DIAGNOSIS — R51 Headache: Secondary | ICD-10-CM | POA: Diagnosis not present

## 2011-07-27 ENCOUNTER — Ambulatory Visit
Admission: RE | Admit: 2011-07-27 | Discharge: 2011-07-27 | Disposition: A | Discharge status: Home / Self Care | Payer: Medicare Other | Source: Ambulatory Visit | Attending: Gastroenterology | Admitting: Gastroenterology

## 2011-07-27 DIAGNOSIS — R1084 Generalized abdominal pain: Secondary | ICD-10-CM

## 2011-07-27 DIAGNOSIS — K7689 Other specified diseases of liver: Secondary | ICD-10-CM | POA: Diagnosis not present

## 2011-07-27 DIAGNOSIS — R109 Unspecified abdominal pain: Secondary | ICD-10-CM | POA: Diagnosis not present

## 2011-07-27 DIAGNOSIS — I517 Cardiomegaly: Secondary | ICD-10-CM | POA: Diagnosis not present

## 2011-07-27 MED ORDER — IOHEXOL 300 MG/ML  SOLN
100.0000 mL | Freq: Once | INTRAMUSCULAR | Status: AC | PRN
  Administered 2011-07-27: 100 mL via INTRAVENOUS

## 2011-07-31 ENCOUNTER — Ambulatory Visit: Payer: Medicare Other | Admitting: Cardiovascular Disease

## 2011-08-08 ENCOUNTER — Other Ambulatory Visit: Payer: Self-pay

## 2011-08-08 ENCOUNTER — Emergency Department (HOSPITAL_COMMUNITY): Payer: Medicare Other

## 2011-08-08 ENCOUNTER — Emergency Department (HOSPITAL_COMMUNITY)
Admission: EM | Admit: 2011-08-08 | Discharge: 2011-08-09 | Disposition: A | Discharge status: Home / Self Care | Payer: Medicare Other | Attending: Emergency Medicine | Admitting: Emergency Medicine

## 2011-08-08 ENCOUNTER — Encounter (HOSPITAL_COMMUNITY): Payer: Self-pay | Admitting: *Deleted

## 2011-08-08 DIAGNOSIS — K8689 Other specified diseases of pancreas: Secondary | ICD-10-CM | POA: Diagnosis not present

## 2011-08-08 DIAGNOSIS — K828 Other specified diseases of gallbladder: Secondary | ICD-10-CM | POA: Diagnosis not present

## 2011-08-08 DIAGNOSIS — R142 Eructation: Secondary | ICD-10-CM | POA: Insufficient documentation

## 2011-08-08 DIAGNOSIS — Z7982 Long term (current) use of aspirin: Secondary | ICD-10-CM | POA: Diagnosis not present

## 2011-08-08 DIAGNOSIS — R1084 Generalized abdominal pain: Secondary | ICD-10-CM | POA: Diagnosis not present

## 2011-08-08 DIAGNOSIS — J984 Other disorders of lung: Secondary | ICD-10-CM | POA: Diagnosis not present

## 2011-08-08 DIAGNOSIS — R141 Gas pain: Secondary | ICD-10-CM | POA: Diagnosis not present

## 2011-08-08 DIAGNOSIS — I10 Essential (primary) hypertension: Secondary | ICD-10-CM | POA: Diagnosis not present

## 2011-08-08 DIAGNOSIS — K5900 Constipation, unspecified: Secondary | ICD-10-CM | POA: Insufficient documentation

## 2011-08-08 DIAGNOSIS — C20 Malignant neoplasm of rectum: Secondary | ICD-10-CM | POA: Diagnosis not present

## 2011-08-08 DIAGNOSIS — R079 Chest pain, unspecified: Secondary | ICD-10-CM | POA: Insufficient documentation

## 2011-08-08 DIAGNOSIS — J9819 Other pulmonary collapse: Secondary | ICD-10-CM | POA: Diagnosis not present

## 2011-08-08 DIAGNOSIS — K5730 Diverticulosis of large intestine without perforation or abscess without bleeding: Secondary | ICD-10-CM | POA: Diagnosis not present

## 2011-08-08 DIAGNOSIS — R109 Unspecified abdominal pain: Secondary | ICD-10-CM | POA: Diagnosis not present

## 2011-08-08 DIAGNOSIS — M47812 Spondylosis without myelopathy or radiculopathy, cervical region: Secondary | ICD-10-CM | POA: Diagnosis not present

## 2011-08-08 DIAGNOSIS — E876 Hypokalemia: Secondary | ICD-10-CM | POA: Diagnosis not present

## 2011-08-08 DIAGNOSIS — K7689 Other specified diseases of liver: Secondary | ICD-10-CM | POA: Diagnosis not present

## 2011-08-08 DIAGNOSIS — I712 Thoracic aortic aneurysm, without rupture: Secondary | ICD-10-CM | POA: Diagnosis not present

## 2011-08-08 DIAGNOSIS — Z85048 Personal history of other malignant neoplasm of rectum, rectosigmoid junction, and anus: Secondary | ICD-10-CM

## 2011-08-08 DIAGNOSIS — R110 Nausea: Secondary | ICD-10-CM | POA: Diagnosis not present

## 2011-08-08 LAB — COMPREHENSIVE METABOLIC PANEL
AST: 31 U/L (ref 0–37)
Albumin: 3.7 g/dL (ref 3.5–5.2)
Chloride: 97 mEq/L (ref 96–112)
Creatinine, Ser: 0.77 mg/dL (ref 0.50–1.35)
Potassium: 3 mEq/L — ABNORMAL LOW (ref 3.5–5.1)
Total Bilirubin: 1.5 mg/dL — ABNORMAL HIGH (ref 0.3–1.2)
Total Protein: 7.3 g/dL (ref 6.0–8.3)

## 2011-08-08 LAB — LIPASE, BLOOD: Lipase: 15 U/L (ref 11–59)

## 2011-08-08 LAB — URINALYSIS, ROUTINE W REFLEX MICROSCOPIC
Ketones, ur: 15 mg/dL — AB
Nitrite: NEGATIVE
Specific Gravity, Urine: 1.023 (ref 1.005–1.030)
pH: 7.5 (ref 5.0–8.0)

## 2011-08-08 LAB — POCT I-STAT TROPONIN I: Troponin i, poc: 0 ng/mL (ref 0.00–0.08)

## 2011-08-08 LAB — CBC
HCT: 43.2 % (ref 39.0–52.0)
MCHC: 33.6 g/dL (ref 30.0–36.0)
MCV: 86.1 fL (ref 78.0–100.0)
RDW: 16 % — ABNORMAL HIGH (ref 11.5–15.5)

## 2011-08-08 LAB — D-DIMER, QUANTITATIVE: D-Dimer, Quant: 0.46 ug/mL-FEU (ref 0.00–0.48)

## 2011-08-08 LAB — URINE MICROSCOPIC-ADD ON

## 2011-08-08 LAB — LACTIC ACID, PLASMA: Lactic Acid, Venous: 1.4 mmol/L (ref 0.5–2.2)

## 2011-08-08 MED ORDER — ASPIRIN EC 325 MG PO TBEC
DELAYED_RELEASE_TABLET | ORAL | Status: AC
  Administered 2011-08-08: 20:00:00
  Filled 2011-08-08: qty 1

## 2011-08-08 MED ORDER — IOHEXOL 300 MG/ML  SOLN
100.0000 mL | Freq: Once | INTRAMUSCULAR | Status: AC | PRN
  Administered 2011-08-08: 100 mL via INTRAVENOUS

## 2011-08-08 MED ORDER — ASPIRIN 325 MG PO TABS: 325.0000 mg | ORAL_TABLET | ORAL | Status: DC

## 2011-08-08 MED ORDER — ASPIRIN EC 325 MG PO TBEC
DELAYED_RELEASE_TABLET | ORAL | Status: AC
  Filled 2011-08-08: qty 1

## 2011-08-08 NOTE — ED Provider Notes (Signed)
History     CSN: [id number]  Arrival date & time 08/08/11  6213   First MD Initiated Contact with Patient 08/08/11 2011      Chief Complaint  Patient presents with  . Chest Pain  . Abdominal Pain  . Constipation    (Consider location/radiation/quality/duration/timing/severity/associated sxs/prior treatment) HPI Comments: Patient reports that he has had generalized abdominal pain radiating into his chest for the past three days.  He reports that he did feel constipated.  Last BM was 45 minutes ago while in the ED.  He reports that his bowel movement was a normal bowel movement.  He denies any blood in his stool.  He denies any vomiting.  Denies any fever/chills.  He reports that he had been constipated, but was recently prescribed a stool softener by his PCP, which has been helping.   He reports that he has never had any abdominal surgeries.  However, according to chart he has had a surgical excision of rectal cancer.  Patient is a poor historian.  He reports that he was diagnosed with rectal cancer three years ago.  He reports that he has seen Dr. Elnoria Howard for his rectal cancer in the past.  The history is provided by the patient.    Past Medical History  Diagnosis Date  . Arthritis   . Hypertension   . Diabetes mellitus   . Rectal cancer     Past Surgical History  Procedure Date  . Surgical excision of rectal cancer     Family History  Problem Relation Age of Onset  . Hypertension Other     History  Substance Use Topics  . Smoking status: Former Smoker    Types: Cigarettes  . Smokeless tobacco: Never Used  . Alcohol Use: Yes     heavy drinker, last alcohol was yesterday       Review of Systems  Constitutional: Negative for fever and chills.  Respiratory: Negative for shortness of breath.   Cardiovascular: Positive for chest pain.  Gastrointestinal: Positive for constipation and abdominal distention. Negative for nausea, vomiting and blood in stool.  Skin: Negative  for rash.  Neurological: Negative for dizziness, syncope and light-headedness.    Allergies  Review of patient's allergies indicates no known allergies.  Home Medications   Current Outpatient Rx  Name Route Sig Dispense Refill  . AMLODIPINE BESYLATE 5 MG PO TABS Oral Take 5 mg by mouth every morning.    . ASPIRIN EC 81 MG PO TBEC Oral Take 81 mg by mouth daily.      Marland Kitchen CARBOXYMETHYLCELLULOSE SODIUM 0.5 % OP SOLN Both Eyes Place 1 drop into both eyes 2 (two) times daily as needed. For dry eyes. Morning and afternoon.    Marland Kitchen LISINOPRIL 10 MG PO TABS Oral Take 10 mg by mouth every morning.    Marland Kitchen SYSTANE BALANCE OP Both Eyes Place 1 drop into both eyes 2 (two) times daily.     . TRAMADOL HCL 50 MG PO TABS Oral Take 1 tablet (50 mg total) by mouth every 4 (four) hours as needed. For pain. Maximum dose= 8 tablets per day 30 tablet 2    BP 147/85  Pulse 79  Temp 97.5 F (36.4 C)  Resp 18  Ht 5\' 11"  (1.803 m)  Wt 200 lb (90.719 kg)  BMI 27.89 kg/m2  SpO2 95%  Physical Exam  Nursing note and vitals reviewed. Constitutional: He is oriented to person, place, and time. He appears well-developed and well-nourished. No distress.  HENT:  Head: Normocephalic and atraumatic.  Neck: Normal range of motion. Neck supple.  Cardiovascular: Normal rate, regular rhythm and normal heart sounds.   Pulmonary/Chest: Effort normal and breath sounds normal.  Abdominal: Soft. Bowel sounds are normal. He exhibits distension. He exhibits no mass. There is no tenderness. There is no rebound and no guarding.  Genitourinary: Rectum normal. Rectal exam shows no fissure, no mass, no tenderness and anal tone normal.  Musculoskeletal: Normal range of motion.  Neurological: He is alert and oriented to person, place, and time.  Skin: Skin is warm and dry. No rash noted. He is not diaphoretic.  Psychiatric: He has a normal mood and affect.    ED Course  Procedures (including critical care time)  Labs Reviewed  CBC  - Abnormal; Notable for the following:    RDW 16.0 (*)    All other components within normal limits  COMPREHENSIVE METABOLIC PANEL - Abnormal; Notable for the following:    Potassium 3.0 (*)    Glucose, Bld 139 (*)    Total Bilirubin 1.5 (*)    All other components within normal limits  PRO B NATRIURETIC PEPTIDE  LIPASE, BLOOD  LACTIC ACID, PLASMA  TROPONIN I  D-DIMER, QUANTITATIVE  POCT I-STAT TROPONIN I  BASIC METABOLIC PANEL  PROCALCITONIN  URINE CULTURE  URINALYSIS, ROUTINE W REFLEX MICROSCOPIC   Dg Chest 2 View  08/08/2011  *RADIOLOGY REPORT*  Clinical Data: Chest pain  CHEST - 2 VIEW  Comparison: 06/26/2011; 06/04/2011; 05/25/2011; 03/06/2010; 08/21/2007  Findings: Grossly unchanged enlarged cardiac silhouette and mediastinal contours.  Grossly unchanged left basilar heterogeneous opacities favored to represent atelectasis or scar.  No new focal airspace opacities.  No pleural effusion or pneumothorax.  Thoracic spine degenerative change.  No acute osseous abnormalities.  IMPRESSION:  Chronic left basilar atelectasis or scar.  No definite acute cardiopulmonary disease.  Original Report Authenticated By: Waynard Reeds, M.D.   Dg Abd Acute W/chest  08/08/2011  *RADIOLOGY REPORT*  Clinical Data: Evaluate for small bowel obstruction, pneumonia, free air  ACUTE ABDOMEN SERIES (ABDOMEN 2 VIEW & CHEST 1 VIEW)  Comparison: Chest radiograph - 08/08/2011; 05/15/2011; CT abdomen pelvis - 07/27/2011; 06/04/2011  Findings: Grossly unchanged enlarged cardiac silhouette and mediastinal contours.  Grossly unchanged left basilar heterogeneous opacities favored to represent atelectasis or scar.  There is mild gaseous distension of several loops of small bowel with index loop within the left upper abdominal quadrant measuring approximately 4 cm in diameter.  The upright radiograph demonstrates several air fluid levels with the mid hemiabdomen. There is a paucity of distal colonic gas.  No  pneumoperitoneum, pneumatosis or portal venous gas.  Multilevel thoracolumbar spine degenerative change.  IMPRESSION: 1.  Findings concerning for developing small bowel obstruction.  2.  Chronic left basilar atelectasis/scar without definite acute cardiopulmonary disease.  Original Report Authenticated By: Waynard Reeds, M.D.     No diagnosis found.  10:00 PM Reassessed patient.  Patient is not having any pain at this time.  1:22 AM Patient reports that he is not having any pain at this time.  Discussed patient with Dr. Wendelyn Breslow with [facility].  He reports that the patient does not meet admission criteria and can have additional imaging done outpatient.  2:00 AM  Discussed patient with Oncologist on call.  He recommends having the patient call [facility] in the morning and scheduling an appointment.  MDM  Patient comes in today with generalized abdominal pain and constipation.  He  did have a bowel movement while in the ED.  Initial acute abdominal series demonstrated findings concerning for SBO.  Patient not vomiting.  Therefore, CT abdomen and pelvis with contrast was ordered to rule out SBO.  CT results demonstrated liver nodules, which are concerning for liver mets.  Radiologist is recommending follow up with MRI.  Discussed with patient and daughter the option of being discharged and following up outpatient or being admitted for further evaluation.  Both patient and daughter report that they would rather have the patient admitted for further evaluation.  Discussed with Dr. Wendelyn Breslow with [facility].  He states that patient does not meet criteria for admission.  Discussed with Oncologist on call.  He states that patient can be followed up outpatient at [facility] for further evaluation.  Patient given instructions to call tomorrow morning and make an appointment.  Patient is in agreement with the plan.  Patient stable at time of discharge.        Pascal Lux [place],  PA-C 08/09/11 0231  Magnus Sinning, PA-C 08/09/11 [phone]

## 2011-08-08 NOTE — ED Provider Notes (Signed)
71yo M, confusing historian.  States he is here for right sided chest pain radiating into his right neck, assoc with SOB since yesterday.  Also states his abd is "swollen" and he has not had a BM in 2 days.  Feels nauseated.  Denies rectal pain.  States he went to his PMD 2 months ago for same, unclear what his dx was.  VSS, A&O, poor historian, CTA, RRR, +abd distended/tympanitic/decreased bowel sounds, neuro non-focal.  AXR, EKG, labs, UA/UC ordered.   I saw this patient for screening purposes.  Pt is stable, but further workup and evaluation is needed beyond triage capabilities. Lancelot Alyea Allison [place], DO 08/08/11 2131

## 2011-08-08 NOTE — ED Provider Notes (Signed)
Patient presents emergent complaints of some abdominal discomfort and constipation. He denies any acute symptoms at this time. His x-ray suggests the possibility of a bowel obstruction however he has not had any vomiting. He denies any prior surgeries. On exam his abdomen does appear to be distended but is not tender or tympanitic. He feels this is not any different than his usual appearance however. At this point we will proceed with abdominal pelvic CT to evaluate further  Medical screening examination/treatment/procedure(s) were conducted as a shared visit with non-physician practitioner(s) and myself.  I personally evaluated the patient during the encounter   Celene Kras, MD 08/10/11 1013

## 2011-08-08 NOTE — ED Notes (Signed)
Complaining of chest pain states it feels like indigestion. Rates the pain as 6/10. Radiates from chest to right side of neck to ear

## 2011-08-08 NOTE — ED Notes (Signed)
Pt. Resting on stretcher. Aware of need for urine sample. Urinal at bedside. Pt. Unable to go at this time. No needs voiced.

## 2011-08-08 NOTE — ED Notes (Signed)
Pt st's she has had chest pain, abd pain, shortness of breath and constipation.  St's had been taking a med for acid reflux but ran out of med.

## 2011-08-08 NOTE — ED Notes (Signed)
Pt here with c/o chest pain off and on and constipation x 3 months. States he has not had a good bowel movement  For three months but just had a bm on arrival to room.Skin warm and dry,resp even and unlabored. Color pink. sr on the monitor.

## 2011-08-08 NOTE — ED Notes (Signed)
CT notified patient finished drinking contrast. 

## 2011-08-08 NOTE — ED Notes (Signed)
Patient reports he has had chest pain, abd pain, shortness of breath and pain into his neck since yesterday.  He also reports being constipated.  Patient has noted swollen abd.  Patient reports intermittent nausea.

## 2011-08-09 ENCOUNTER — Encounter (HOSPITAL_COMMUNITY): Payer: Self-pay | Admitting: *Deleted

## 2011-08-09 ENCOUNTER — Emergency Department (HOSPITAL_COMMUNITY): Payer: Medicare Other

## 2011-08-09 ENCOUNTER — Other Ambulatory Visit: Payer: Self-pay | Admitting: Gastroenterology

## 2011-08-09 ENCOUNTER — Emergency Department (HOSPITAL_COMMUNITY)
Admission: EM | Admit: 2011-08-09 | Discharge: 2011-08-09 | Disposition: A | Discharge status: Home / Self Care | Payer: Medicare Other | Attending: Emergency Medicine | Admitting: Emergency Medicine

## 2011-08-09 DIAGNOSIS — I712 Thoracic aortic aneurysm, without rupture, unspecified: Secondary | ICD-10-CM | POA: Insufficient documentation

## 2011-08-09 DIAGNOSIS — R141 Gas pain: Secondary | ICD-10-CM | POA: Insufficient documentation

## 2011-08-09 DIAGNOSIS — R110 Nausea: Secondary | ICD-10-CM | POA: Insufficient documentation

## 2011-08-09 DIAGNOSIS — Z87891 Personal history of nicotine dependence: Secondary | ICD-10-CM | POA: Insufficient documentation

## 2011-08-09 DIAGNOSIS — E119 Type 2 diabetes mellitus without complications: Secondary | ICD-10-CM | POA: Insufficient documentation

## 2011-08-09 DIAGNOSIS — R142 Eructation: Secondary | ICD-10-CM | POA: Insufficient documentation

## 2011-08-09 DIAGNOSIS — E876 Hypokalemia: Secondary | ICD-10-CM | POA: Insufficient documentation

## 2011-08-09 DIAGNOSIS — Z7982 Long term (current) use of aspirin: Secondary | ICD-10-CM | POA: Insufficient documentation

## 2011-08-09 DIAGNOSIS — R1084 Generalized abdominal pain: Secondary | ICD-10-CM | POA: Diagnosis not present

## 2011-08-09 DIAGNOSIS — I10 Essential (primary) hypertension: Secondary | ICD-10-CM | POA: Insufficient documentation

## 2011-08-09 DIAGNOSIS — M129 Arthropathy, unspecified: Secondary | ICD-10-CM | POA: Insufficient documentation

## 2011-08-09 DIAGNOSIS — R109 Unspecified abdominal pain: Secondary | ICD-10-CM

## 2011-08-09 DIAGNOSIS — K5900 Constipation, unspecified: Secondary | ICD-10-CM | POA: Insufficient documentation

## 2011-08-09 DIAGNOSIS — M542 Cervicalgia: Secondary | ICD-10-CM | POA: Insufficient documentation

## 2011-08-09 DIAGNOSIS — R079 Chest pain, unspecified: Secondary | ICD-10-CM | POA: Insufficient documentation

## 2011-08-09 DIAGNOSIS — Z85048 Personal history of other malignant neoplasm of rectum, rectosigmoid junction, and anus: Secondary | ICD-10-CM

## 2011-08-09 DIAGNOSIS — M47812 Spondylosis without myelopathy or radiculopathy, cervical region: Secondary | ICD-10-CM | POA: Diagnosis not present

## 2011-08-09 DIAGNOSIS — J9819 Other pulmonary collapse: Secondary | ICD-10-CM | POA: Diagnosis not present

## 2011-08-09 DIAGNOSIS — F1010 Alcohol abuse, uncomplicated: Secondary | ICD-10-CM | POA: Insufficient documentation

## 2011-08-09 DIAGNOSIS — R198 Other specified symptoms and signs involving the digestive system and abdomen: Secondary | ICD-10-CM | POA: Diagnosis not present

## 2011-08-09 MED ORDER — POTASSIUM CHLORIDE 20 MEQ/15ML (10%) PO LIQD
40.0000 meq | Freq: Once | ORAL | Status: AC
  Administered 2011-08-09: 40 meq via ORAL
  Filled 2011-08-09: qty 30

## 2011-08-09 MED ORDER — METOCLOPRAMIDE HCL 10 MG PO TABS
10.0000 mg | ORAL_TABLET | Freq: Once | ORAL | Status: AC
  Administered 2011-08-09: 10 mg via ORAL
  Filled 2011-08-09: qty 1

## 2011-08-09 MED ORDER — METOCLOPRAMIDE HCL 10 MG PO TABS: 10.0000 mg | ORAL_TABLET | Freq: Three times a day (TID) | ORAL | Status: DC | PRN

## 2011-08-09 NOTE — Discharge Instructions (Signed)
Please follow-up as we discussed. Use the reglan as needed. Return to the ER for new concerns or worsening symptoms.     Thoracic Aortic Aneurysm An aneurysm is the enlargement (dilatation), bulging or ballooning out of part of the wall of a vein or artery. An Aortic Aneurysm is a bulging in the largest artery of the body. This artery supplies blood from the heart to the rest of the body. The first part of the aorta is called the thoracic aorta. It leaves the heart, ascends (rises), arches, and descends (goes down) through the chest until it reaches the diaphragm (the muscular partition between the chest and abdomen (belly). The second part of the aorta is then called the abdominal aorta after it has passed the diaphragm and continues down through the abdomen. The abdominal aorta ends where it splits to form the two iliac arteries that go to the legs. Aortic aneurysms can develop anywhere along the length of the aorta. A thoracic aortic aneurysm (TAA) occurs in the first part of the aorta, between the heart and the diaphragm. The major importance of an aneurysm is that it can rupture or tear (dissect), causing death unless diagnosed and treated promptly. CAUSES  Most thoracic aortic aneurysms are related to arteriosclerosis. The arteriosclerosis can weaken the aortic wall. The pressure of the blood being pumped through the aorta causes it to balloon out at the site of weakness. Therefore, elevated blood pressure (hypertension) is associated with aneurysm. Other risk factors include:  Age over 81.   Tobacco use.   Male sex.   Family history of aneurysm.  Additional causes of thoracic aortic aneurysms include:  Genetics (passed by birth).   Injury: After physical trauma to the aorta.   Inflammation of blood vessels.   Hardening of the arteries.   Infection.  SYMPTOMS  Many aneurysms do not cause problems. A small, unchanging or slowly changing aneurysm may produce no symptoms until it  suddenly ruptures or dissects (separation of the layers of the aortic wall) without warning. It may then cause death. The symptoms (problems) of a developing aneurysm will partly depend on its size and rate of growth. Thoracic aortic aneurysms may cause pain in the:  Chest.   Back.   Sides.   Abdomen.  The pain most often has a deep quality as if it is boring into the person. It may cause:  Heart failure.   Heart attack.   Hoarseness.   Cough.   Shortness of breath.   Swallowing problems.  DIAGNOSIS  A thoracic aortic aneurysm may be suspected based on your symptoms. It may also be detected by x-ray or CT studies done for unrelated reasons.  Several different imaging studies can be used to confirm a TAA:  An echocardiogram is an ultrasound test to examine the heart. It can also examine the first parts of the aorta. Sometimes, this test is done by putting you to sleep and inserting a flexible telescope through your mouth into your esophagus, which is next to the aorta; excellent pictures of the aorta can be obtained. This is called a transesophageal echocardiogram (TEE).   CT scanning of the chest is accurate at showing the exact size and shape of the aneurysm.   MRI scanning is accurate, and is used for certain types of TAA.   An aortic angiogram shows the source of the major blood vessels arising from the aorta. It reveals the size and extent of any aneurysm. It can also show a clot clinging to  the wall of the aneurysm (mural thrombus). The angiogram may give information about a tear of the aorta.  TREATMENT  Treatment for a thoracic aortic aneurysm depends on:  Location.   Size.   Other factors.   Rate of growth.   Underlying cause.  Medical treatment is used for smaller or complicated aneurysms, or those that do not cause symptoms. These include:  Stopping smoking.   Blood pressure control.   Control of cholesterol.  Surgical treatment is used for aneurysms  that cause symptoms, or for those that are large or growing in size. The surgical technique depends on the location of the aneurysm. HOME CARE INSTRUCTIONS   If you smoke, stop. If you do not, do not start.   Take all medications as prescribed.   Your caregiver will tell you when to have your aneurysm rechecked, either by echocardiogram or CT scan. Be sure to keep this and all follow-up appointments.  SEEK MEDICAL CARE IF:   You develop mild pain in your chest, upper back, sides, or abdomen.   You develop cough, hoarseness or trouble swallowing.  SEEK IMMEDIATE MEDICAL CARE IF:   You develop severe chest or abdominal pain, or severe pain moving (radiating) to your back.   You suddenly develop cold or blue toes or feet.   You suddenly develop lightheadedness or fainting spells.   You develop trouble breathing.  Document Released: 05/29/2005 Document Revised: 02/08/2011 Document Reviewed: 04/17/2007 [facility] Patient Information 2012 [place], [place].      Hypokalemia Hypokalemia means a low potassium level in the blood.Potassium is an electrolyte that helps regulate the amount of fluid in the body. It also stimulates muscle contraction and maintains a stable acid-base balance.Most of the body's potassium is inside of cells, and only a very small amount is in the blood. Because the amount in the blood is so small, minor changes can have big effects. PREPARATION FOR TEST Testing for potassium requires taking a blood sample taken by needle from a vein in the arm. The skin is cleaned thoroughly before the sample is drawn. There is no other special preparation needed. NORMAL VALUES Potassium levels below 3.5 mEq/L are abnormally low. Levels above 5.1 mEq/L are abnormally high. Ranges for normal findings may vary among different laboratories and hospitals. You should always check with your doctor after having lab work or other tests done to discuss the meaning of your test results and  whether your values are considered within normal limits. MEANING OF TEST  Your caregiver will go over the test results with you and discuss the importance and meaning of your results, as well as treatment options and the need for additional tests, if necessary. A potassium level is frequently part of a routine medical exam. It is usually included as part of a whole "panel" of tests for several blood salts (such as Sodium and Chloride). It may be done as part of follow-up when a low potassium level was found in the past or other blood salts are suspected of being out of balance. A low potassium level might be suspected if you have one or more of the following:  Symptoms of weakness.   Abnormal heart rhythms.   High blood pressure and are taking medication to control this, especially water pills (diuretics).   Kidney disease that can affect your potassium level .   Diabetes requiring the use of insulin. The potassium may fall after taking insulin, especially if the diabetes had been out of control for a while.  A condition requiring the use of cortisone-type medication or certain types of antibiotics.   Vomiting and/or diarrhea for more than a day or two.   A stomach or intestinal condition that may not permit appropriate absorption of potassium.   Fainting episodes.   Mental confusion.  OBTAINING TEST RESULTS It is your responsibility to obtain your test results. Ask the lab or department performing the test when and how you will get your results.  Please contact your caregiver directly if you have not received the results within one week. At that time, ask if there is anything different or new you should be doing in relation to the results. TREATMENT Hypokalemia can be treated with potassium supplements taken by mouth and/or adjustments in your current medications. A diet high in potassium is also helpful. Foods with high potassium content are:  Peas, lentils, lima beans, nuts, and  dried fruit.   Whole grain and bran cereals and breads.   Fresh fruit, vegetables (bananas, cantaloupe, grapefruit, oranges, tomatoes, honeydew melons, potatoes).   Orange and tomato juices.   Meats. If potassium supplement has been prescribed for you today or your medications have been adjusted, see your personal caregiver in time02 for a re-check.  SEEK MEDICAL CARE IF:  There is a feeling of worsening weakness.   You experience repeated chest palpitations.   You are diabetic and having difficulty keeping your blood sugars in the normal range.   You are experiencing vomiting and/or diarrhea.   You are having difficulty with any of your regular medications.  SEEK IMMEDIATE MEDICAL CARE IF:  You experience chest pain, shortness of breath, or episodes of dizziness.   You have been having vomiting or diarrhea for more than 2 days.   You have a fainting episode.  MAKE SURE YOU:   Understand these instructions.   Will watch your condition.   Will get help right away if you are not doing well or get worse.  Document Released: 05/29/2005 Document Revised: 02/08/2011 Document Reviewed: 05/09/2008 [facility] Patient Information 2012 [place], [place].       Abdominal Pain Abdominal pain can be caused by many things. Your caregiver decides the seriousness of your pain by an examination and possibly blood tests and X-rays. Many cases can be observed and treated at home. Most abdominal pain is not caused by a disease and will probably improve without treatment. However, in many cases, more time must pass before a clear cause of the pain can be found. Before that point, it may not be known if you need more testing, or if hospitalization or surgery is needed. HOME CARE INSTRUCTIONS   Do not take laxatives unless directed by your caregiver.   Take pain medicine only as directed by your caregiver.   Only take over-the-counter or prescription medicines for pain, discomfort, or fever as  directed by your caregiver.   Try a clear liquid diet (broth, tea, or water) for as long as directed by your caregiver. Slowly move to a bland diet as tolerated.  SEEK IMMEDIATE MEDICAL CARE IF:   The pain does not go away.   You have a fever.   You keep throwing up (vomiting).   The pain is felt only in portions of the abdomen. Pain in the right side could possibly be appendicitis. In an adult, pain in the left lower portion of the abdomen could be colitis or diverticulitis.   You pass bloody or black tarry stools.  MAKE SURE YOU:   Understand these instructions.  Will watch your condition.   Will get help right away if you are not doing well or get worse.  Document Released: 03/08/2005 Document Revised: 02/08/2011 Document Reviewed: 01/15/2008 [facility] Patient Information 2012 [place], [place].

## 2011-08-09 NOTE — ED Notes (Signed)
Was here last night for same, constipation and upper gi pain. Pain returned when he got home, has not taken any meds for it. No acute distress noted attriage.

## 2011-08-09 NOTE — Discharge Instructions (Signed)
Continue taking your stool softeners as prescribed by your primary care physician.  Return to the Emergency Department if symptoms change or worsen, you develop fevers, or blood in your stool. It is important for you to follow up with Dr. Elnoria Howard and the [facility].  Call the [facility] in the morning and make an appointment.  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  You need to be evaluated as soon as possible.  Abdominal Pain Abdominal pain can be caused by many things. Your caregiver decides the seriousness of your pain by an examination and possibly blood tests and X-rays. Many cases can be observed and treated at home. Most abdominal pain is not caused by a disease and will probably improve without treatment. However, in many cases, more time must pass before a clear cause of the pain can be found. Before that point, it may not be known if you need more testing, or if hospitalization or surgery is needed. HOME CARE INSTRUCTIONS   Do not take laxatives unless directed by your caregiver.   Take pain medicine only as directed by your caregiver.   Only take over-the-counter or prescription medicines for pain, discomfort, or fever as directed by your caregiver.   Try a clear liquid diet (broth, tea, or water) for as long as directed by your caregiver. Slowly move to a bland diet as tolerated.  SEEK IMMEDIATE MEDICAL CARE IF:   The pain does not go away.   You have a fever.   You keep throwing up (vomiting).   The pain is felt only in portions of the abdomen. Pain in the right side could possibly be appendicitis. In an adult, pain in the left lower portion of the abdomen could be colitis or diverticulitis.   You pass bloody or black tarry stools.  MAKE SURE YOU:   Understand these instructions.   Will watch your condition.   Will  get help right away if you are not doing well or get worse.  Document Released: 03/08/2005 Document Revised: 02/08/2011 Document Reviewed: 01/15/2008 [facility] Patient Information 2012 [place], [place].Abdominal Pain Abdominal pain can be caused by many things. Your caregiver decides the seriousness of your pain by an examination and possibly blood tests and X-rays. Many cases can be observed and treated at home. Most abdominal pain is not caused by a disease and will probably improve without treatment. However, in many cases, more time must pass before a clear cause of the pain can be found. Before that point, it may not be known if you need more testing, or if hospitalization or surgery is needed. HOME CARE INSTRUCTIONS   Do not take laxatives unless directed by your caregiver.   Take pain medicine only as directed by your caregiver.   Only take over-the-counter or prescription medicines for pain, discomfort, or fever as directed by your caregiver.   Try a clear liquid diet (broth, tea, or water) for as long as directed by your caregiver. Slowly move to a bland diet as tolerated.  SEEK IMMEDIATE MEDICAL CARE IF:   The pain does not go away.   You have a fever.   You keep throwing up (vomiting).   The pain is felt only in portions of the abdomen. Pain in the right side could possibly be appendicitis. In an adult, pain in the left lower portion  of the abdomen could be colitis or diverticulitis.   You pass bloody or black tarry stools.  MAKE SURE YOU:   Understand these instructions.   Will watch your condition.   Will get help right away if you are not doing well or get worse.  Document Released: 03/08/2005 Document Revised: 02/08/2011 Document Reviewed: 01/15/2008 [facility] Patient Information 2012 [place], [place].

## 2011-08-09 NOTE — ED Notes (Signed)
Pt denies CP & abd pain at this time, pt on cardiac monitor, vitals within normal limits, will continue to monitor, NAD

## 2011-08-09 NOTE — ED Provider Notes (Signed)
History     CSN: [id number]  Arrival date & time 08/09/11  1191   First MD Initiated Contact with Patient 08/09/11 1008      Chief Complaint  Patient presents with  . Abdominal Pain    (Consider location/radiation/quality/duration/timing/severity/associated sxs/prior treatment) Patient is a 71 y.o. male presenting with abdominal pain. The history is provided by the patient, a relative and medical records.  Abdominal Pain The primary symptoms of the illness include abdominal pain and nausea. The primary symptoms of the illness do not include fever, shortness of breath, vomiting, diarrhea or dysuria.  Additional symptoms associated with the illness include constipation. Symptoms associated with the illness do not include chills, hematuria or back pain.  Pt with hx rectal cancer with surgical excision approx 3 years ago presents to ED with a c/c of right-sided abdominal pain that radiates upward into his chest and right neck. Assoc constipation, though last BM was yesterday and normal for him. Has been taking a stool softener per PMD recommendations. There is assoc decreased appetite and intermittent nausea. There has been no fever, chills, HA, SOB, vomiting.  Was seen in ED for same last night, at which time he had a workup including CBC, CMP, lipase, BNP, D-Dimer, Lactic Acid, Procalcitonin, and urinalysis as well as a chest x-ray, abdominal plain films, and an abdominal CT scan with contrast. Possible metastatic liver lesions seen on abd CT scan, was d/c home with advice to f/u with GI for possible abd MRI. Daughter reports they spoke with Dr Elnoria Howard this morning, who recommended a flex sig on Monday with possible MRI afterward. Due to pt continued pain, they elected to return to ED for further evaluation.  Past Medical History  Diagnosis Date  . Arthritis   . Hypertension   . Diabetes mellitus   . Rectal cancer     Past Surgical History  Procedure Date  . Surgical excision of rectal  cancer     Family History  Problem Relation Age of Onset  . Hypertension Other     History  Substance Use Topics  . Smoking status: Former Smoker    Types: Cigarettes  . Smokeless tobacco: Never Used  . Alcohol Use: Yes     heavy drinker, last alcohol was yesterday       Review of Systems  Constitutional: Negative for fever and chills.  HENT: Positive for neck pain. Negative for congestion, sore throat and neck stiffness.   Eyes: Negative for pain and visual disturbance.  Respiratory: Negative for cough, chest tightness and shortness of breath.   Cardiovascular: Positive for chest pain.  Gastrointestinal: Positive for nausea, abdominal pain and constipation. Negative for vomiting, diarrhea and blood in stool.  Genitourinary: Negative for dysuria, hematuria and flank pain.  Musculoskeletal: Negative for back pain and gait problem.  Skin: Negative for rash and wound.  Neurological: Negative for dizziness, syncope, weakness and headaches.  Psychiatric/Behavioral: Negative for confusion.    Allergies  Review of patient's allergies indicates no known allergies.  Home Medications   Current Outpatient Rx  Name Route Sig Dispense Refill  . AMLODIPINE BESYLATE 5 MG PO TABS Oral Take 5 mg by mouth every morning.    . ASPIRIN EC 81 MG PO TBEC Oral Take 81 mg by mouth daily.      Marland Kitchen CARBOXYMETHYLCELLULOSE SODIUM 0.5 % OP SOLN Both Eyes Place 1 drop into both eyes 2 (two) times daily as needed. For dry eyes. Morning and afternoon.    Marland Kitchen LISINOPRIL 10  MG PO TABS Oral Take 10 mg by mouth every morning.    Marland Kitchen SYSTANE BALANCE OP Both Eyes Place 1 drop into both eyes 2 (two) times daily.     . TRAMADOL HCL 50 MG PO TABS Oral Take 1 tablet (50 mg total) by mouth every 4 (four) hours as needed. For pain. Maximum dose= 8 tablets per day 30 tablet 2    BP 127/75  Pulse 74  Temp(Src) 97.6 F (36.4 C) (Oral)  Resp 18  SpO2 98%  Physical Exam  Nursing note and vitals  reviewed. Constitutional: He is oriented to person, place, and time. He appears well-developed and well-nourished. No distress.  HENT:  Head: Normocephalic and atraumatic.  Right Ear: External ear normal.  Left Ear: External ear normal.  Mouth/Throat: Oropharynx is clear and moist.  Eyes: Pupils are equal, round, and reactive to light.  Neck: Normal range of motion. Neck supple.       MIld TTP posterior neck  Cardiovascular: Normal rate, regular rhythm, normal heart sounds and intact distal pulses.   Pulmonary/Chest: Effort normal and breath sounds normal. No respiratory distress. He has no wheezes. He exhibits no tenderness.  Abdominal: Soft. Bowel sounds are normal. He exhibits distension. He exhibits no mass. There is no tenderness. There is no rebound and no guarding.       Diastasis Recti present  Musculoskeletal: He exhibits no edema and no tenderness.  Neurological: He is alert and oriented to person, place, and time. No cranial nerve deficit.  Skin: Skin is warm and dry. No rash noted. He is not diaphoretic.  Psychiatric: He has a normal mood and affect.    ED Course  Procedures (including critical care time)  Labs Reviewed - No data to display Dg Chest 2 View  08/08/2011  *RADIOLOGY REPORT*  Clinical Data: Chest pain  CHEST - 2 VIEW  Comparison: 06/26/2011; 06/04/2011; 05/25/2011; 03/06/2010; 08/21/2007  Findings: Grossly unchanged enlarged cardiac silhouette and mediastinal contours.  Grossly unchanged left basilar heterogeneous opacities favored to represent atelectasis or scar.  No new focal airspace opacities.  No pleural effusion or pneumothorax.  Thoracic spine degenerative change.  No acute osseous abnormalities.  IMPRESSION:  Chronic left basilar atelectasis or scar.  No definite acute cardiopulmonary disease.  Original Report Authenticated By: Waynard Reeds, M.D.   Ct Chest Wo Contrast  08/09/2011  *RADIOLOGY REPORT*  Clinical Data: Neck, chest and abdominal pain with  recent worsening.  CT CHEST WITHOUT CONTRAST  Technique:  Multidetector CT imaging of the chest was performed following the standard protocol without IV contrast.  Comparison: Chest radiograph 08/08/2011.  CT chest 11/04/2008.  Findings: No pathologically enlarged mediastinal or axillary lymph nodes.  Hilar regions are difficult to definitively evaluate without IV contrast.  Ascending aorta measures 4.1 cm.  Pulmonary arteries are enlarged.  Coronary artery calcification.  Heart is at the upper limits of normal in size.  No pericardial effusion.  Mild paraseptal emphysema.  Mild dependent atelectasis in the lower lobes, right greater than left.  No pleural fluid.  Adherent debris is seen in the left mainstem bronchus.  Incidental imaging of the upper abdomen shows no acute findings. No worrisome lytic or sclerotic lesions.  Degenerative changes are seen in the spine.  IMPRESSION:  1.  No findings to explain the patient's given symptoms. 2.  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  Original Report Authenticated By: Reyes Ivan, M.D.   Ct Cervical Spine  Wo Contrast  08/09/2011  *RADIOLOGY REPORT*  Clinical Data: 71 year old male with pain radiating down the right to the chest.  CT CERVICAL SPINE WITHOUT CONTRAST  Technique:  Multidetector CT imaging of the cervical spine was performed. Multiplanar CT image reconstructions were also generated.  Comparison: 04/04/2008.  Findings: Calcified atherosclerosis at the skull base.  Stable visualized noncontrast brain parenchyma.  Dominant distal right vertebral artery. Visualized orbit soft tissues are within normal limits.  Visualized paranasal sinuses and mastoids are clear. Visualized paraspinal soft tissues are within normal limits. Stable visible scarring at the lung apices.  Overall stable cervical vertebral height and alignment. Visualized skull base is intact.  No atlanto-occipital dissociation.  Cervicothoracic junction alignment is within normal limits. Bilateral posterior element alignment is within normal limits. Chronic bulky cervical endplate spurring, maximal at C4-C5. Chronic cervical facet hypertrophy.  Chronic cervical disc space loss.  No acute cervical fracture.  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  IMPRESSION: 1. No acute fracture or listhesis identified in the cervical spine. Ligamentous injury is not excluded. 2.  Advanced chronic cervical degenerative changes including chronic mild to moderate spinal stenosis from C3-C4 to C6-C7.  Original Report Authenticated By: Harley Hallmark, M.D.   Ct Abdomen Pelvis W Contrast  08/09/2011  *RADIOLOGY REPORT*  Clinical Data: Chest pain and constipation.  CT ABDOMEN AND PELVIS WITH CONTRAST  Technique:  Multidetector CT imaging of the abdomen and pelvis was performed following the standard protocol during bolus administration of intravenous contrast.  Contrast:  100 ml Omnipaque-300  Comparison: 07/27/2011  Findings: Lung bases are clear.  No evidence of free air.  Decreased attenuation of the liver is consistent with hepatic steatosis.  There are scattered hyperdense nodular lesions throughout the liver.  These findings could be related to areas of fat sparing but there is also concern for underlying hepatic lesions.  There is mild distention of the gallbladder.  Portal venous system appears to be patent.  Mild dilatation of pancreatic duct which appears unchanged.  No gross abnormality to the spleen, adrenal glands or kidneys.  Normal appearance of the appendix.  Diverticula involving the left colon without acute inflammatory changes.  There is fluid throughout the colon. There is mild wall thickening at the junction of the rectum and sigmoid colon.  No gross abnormality to the prostate, seminal vesicles or urinary bladder.  Stable small sclerotic density in the right iliac bone. No acute  bony abnormality.  Small cyst in the right kidney lower pole.  IMPRESSION:  Scattered dense nodules throughout the liver.  The background liver is low density and related to hepatic steatosis. Reportedly, the patient has a history of rectal cancer and these findings raise concern for hepatic lesions.  Recommend further evaluation of the liver with MRI (with and without contrast) to differentiate liver lesions from fat sparing.  Cannot exclude wall thickening at the junction of the sigmoid colon and rectum.  This could be better evaluated with sigmoidoscopy.  These results were called by telephone on 08/09/2011   at  12:37 a.m. to  [facility], who verbally acknowledged these results.  Original Report Authenticated By: Richarda Overlie, M.D.   Dg Abd Acute W/chest  08/08/2011  *RADIOLOGY REPORT*  Clinical Data: Evaluate for small bowel obstruction, pneumonia, free air  ACUTE ABDOMEN SERIES (ABDOMEN 2 VIEW & CHEST 1 VIEW)  Comparison: Chest radiograph - 08/08/2011; 05/15/2011; CT abdomen pelvis - 07/27/2011; 06/04/2011  Findings: Grossly unchanged enlarged cardiac  silhouette and mediastinal contours.  Grossly unchanged left basilar heterogeneous opacities favored to represent atelectasis or scar.  There is mild gaseous distension of several loops of small bowel with index loop within the left upper abdominal quadrant measuring approximately 4 cm in diameter.  The upright radiograph demonstrates several air fluid levels with the mid hemiabdomen. There is a paucity of distal colonic gas.  No pneumoperitoneum, pneumatosis or portal venous gas.  Multilevel thoracolumbar spine degenerative change.  IMPRESSION: 1.  Findings concerning for developing small bowel obstruction.  2.  Chronic left basilar atelectasis/scar without definite acute cardiopulmonary disease.  Original Report Authenticated By: Waynard Reeds, M.D.     1. Ascending aortic aneurysm   2. Abdominal pain   3. Nausea   4. Hypokalemia       MDM   Clinical Course:  11:00 AM Continued abd pain in pt with questionable rectal Ca mets to liver. Hypokalemia on labs last night without supplementation, will order in ED. Reglan also ordered given hx DM as pain/nausea may be related to gastroparesis.   11:45 AM As pain is also in chest and neck (with labs overnight neg troponin, BNP, d-dimer), will further eval these with imaging without contrast, per discussion with attending MD.  3:29 PM Discussed imaging results with pt and daughter, who agree to continue f/u with Dr Elnoria Howard and will also see vascular surgeon regarding aortic aneurysm. Pt presentation and vital signs not c/w dissection at this time. Reglan improved nausea significantly, will d/c home with rx and precautions to be aware of possible sedation.     Medications given in ED: Reglan 10mg ; KCl     Labs Reviewed: From last night's visit- CBC, CMP, lipase, d-dimer, BNP, Troponin, Procalcitonin, Lactic Acid, urinalysis    Imaging Reviewed: CT scan abd/pelvis, CXR, Abd series from last night; CT C-spine and CT chest without contrast from today's visit with results as above    Consultant Called: none    Disposition: home    Prescriptions: Reglan 10mg  PO q8h prn nausea      Shaaron Adler, PA-C 08/09/11 1536

## 2011-08-09 NOTE — ED Notes (Signed)
Pt in CT.

## 2011-08-09 NOTE — ED Provider Notes (Addendum)
Patient relates for the past 3 months he's had abdominal pain that radiates up into his chest and into his neck. He states it's gotten worse over the past 3 days and is actually seeing the ER yesterday where he had a CT of his abdomen and pelvis which did not show any acute findings. He states his pain is worse in his chest and he also states he has pain in his neck and it hurts when he moves his head. He also sometimes gets numbness in his left arm. He also gets headaches. He was seen by Dr. home today and is scheduled to finish a GI evaluation. He states the pain is not related to anything he does nothing makes it feel better. Patient also had negative troponin, d-dimer, BNP yesterday  Patient has some mild tenderness of his neck. His abdomen is soft and nontender to palpation. Chest is nontender to palpation.  I discussed with Amedeo Gory his PA about further evaluation of his neck and chest discomfort since his abdominal complaints have been thoroughly addressed.  Medical screening examination/treatment/procedure(s) were conducted as a shared visit with non-physician practitioner(s) and myself.  I personally evaluated the patient during the encounter Devoria Albe, MD, Franz Dell, MD 08/09/11 1152  Ward Givens, MD 08/09/11 [phone]

## 2011-08-09 NOTE — ED Notes (Signed)
NAD noted at d/c. Pt and family member verbalized understanding of d/c inst.

## 2011-08-09 NOTE — ED Provider Notes (Signed)
See prior note   Ward Givens, MD 08/09/11 1610

## 2011-08-09 NOTE — ED Notes (Signed)
Pt was received to [facility] with c/o abdominal pain. Pt was seen here in the ED yesterday and was discharged this morning for the same problem but came back because pain is not better. Pt is A/A/Ox4, skin warm and dry, respiration is even and unlabored. Family is at the bedside

## 2011-08-10 ENCOUNTER — Encounter (HOSPITAL_COMMUNITY): Payer: Self-pay | Admitting: Emergency Medicine

## 2011-08-10 ENCOUNTER — Other Ambulatory Visit: Payer: Self-pay

## 2011-08-10 ENCOUNTER — Emergency Department (HOSPITAL_COMMUNITY)
Admission: EM | Admit: 2011-08-10 | Discharge: 2011-08-10 | Disposition: A | Discharge status: Home Health | Payer: Medicare Other | Attending: Emergency Medicine | Admitting: Emergency Medicine

## 2011-08-10 DIAGNOSIS — R109 Unspecified abdominal pain: Secondary | ICD-10-CM | POA: Insufficient documentation

## 2011-08-10 DIAGNOSIS — K7689 Other specified diseases of liver: Secondary | ICD-10-CM | POA: Diagnosis not present

## 2011-08-10 DIAGNOSIS — M542 Cervicalgia: Secondary | ICD-10-CM | POA: Diagnosis not present

## 2011-08-10 DIAGNOSIS — E119 Type 2 diabetes mellitus without complications: Secondary | ICD-10-CM | POA: Insufficient documentation

## 2011-08-10 DIAGNOSIS — I10 Essential (primary) hypertension: Secondary | ICD-10-CM | POA: Diagnosis not present

## 2011-08-10 DIAGNOSIS — I712 Thoracic aortic aneurysm, without rupture, unspecified: Secondary | ICD-10-CM | POA: Insufficient documentation

## 2011-08-10 DIAGNOSIS — R079 Chest pain, unspecified: Secondary | ICD-10-CM | POA: Insufficient documentation

## 2011-08-10 DIAGNOSIS — I2510 Atherosclerotic heart disease of native coronary artery without angina pectoris: Secondary | ICD-10-CM | POA: Insufficient documentation

## 2011-08-10 DIAGNOSIS — I2789 Other specified pulmonary heart diseases: Secondary | ICD-10-CM | POA: Diagnosis not present

## 2011-08-10 LAB — CBC
MCHC: 33.9 g/dL (ref 30.0–36.0)
RDW: 15.9 % — ABNORMAL HIGH (ref 11.5–15.5)

## 2011-08-10 LAB — POCT I-STAT TROPONIN I: Troponin i, poc: 0 ng/mL (ref 0.00–0.08)

## 2011-08-10 LAB — COMPREHENSIVE METABOLIC PANEL
ALT: 21 U/L (ref 0–53)
Albumin: 3.4 g/dL — ABNORMAL LOW (ref 3.5–5.2)
Alkaline Phosphatase: 104 U/L (ref 39–117)
Potassium: 3.1 mEq/L — ABNORMAL LOW (ref 3.5–5.1)
Sodium: 135 mEq/L (ref 135–145)
Total Protein: 7.3 g/dL (ref 6.0–8.3)

## 2011-08-10 LAB — URINALYSIS, ROUTINE W REFLEX MICROSCOPIC
Nitrite: NEGATIVE
Specific Gravity, Urine: 1.021 (ref 1.005–1.030)
pH: 6 (ref 5.0–8.0)

## 2011-08-10 LAB — URINE MICROSCOPIC-ADD ON

## 2011-08-10 LAB — URINE CULTURE

## 2011-08-10 LAB — LIPASE, BLOOD: Lipase: 18 U/L (ref 11–59)

## 2011-08-10 MED ORDER — OXYCODONE-ACETAMINOPHEN 5-325 MG PO TABS: 1.0000 | ORAL_TABLET | Freq: Four times a day (QID) | ORAL | Status: AC | PRN

## 2011-08-10 MED ORDER — OXYCODONE-ACETAMINOPHEN 5-325 MG PO TABS
1.0000 | ORAL_TABLET | Freq: Once | ORAL | Status: AC
  Administered 2011-08-10: 1 via ORAL
  Filled 2011-08-10: qty 1

## 2011-08-10 NOTE — ED Notes (Signed)
Pt was seen yesterday for same CP.  Pt states that a spot was found on lung and on liver but doesn't know which lung.  Daughter sitting with pt and talking for pt.  Pt stated pain was 8/10.  Daughter said it a 10/10.  "tell her its a 10, I dont want to sit here all day again and them not do anything for you".  I advised daughter that I had asked Pt and needed to record his answer.  Pt states the pain starts in rt chest and radiates up to rt eye also feels as if his left arm feels like it is numb.  Saw cardiologist 3 weeks ago and no cardiac issues were found. Pt has appt with cancer md on Monday.

## 2011-08-10 NOTE — ED Notes (Signed)
Was here yesteday for same cp  States has had it for 3 months worse the last few days has appointment on Monday cannot wait some sob found spots on liver  And lung

## 2011-08-10 NOTE — ED Provider Notes (Signed)
History     CSN: [id number]  Arrival date & time 08/10/11  0865   First MD Initiated Contact with Patient 08/10/11 [id number]      No chief complaint on file.   (Consider location/radiation/quality/duration/timing/severity/associated sxs/prior treatment) HPI Patient presents with ongoing complaints of abdominal pain radiating into his right chest and neck. He has been seen multiple times recently in the emergency department for similar complaints. He states this morning he felt that his stomach was "gurgling" more than it had been before. He denies any change in the pain. He states the chest pain and abdominal pain have been constant over the last several weeks. He did start taking GoLYTELY for her a bowel prep and is to have a colonoscopy performed by Dr. Elnoria Howard of gastroenterology in 3 days. He states that the increase in his stomach gurgling started after taking the first drink of GoLYTELY this morning. He has not had any blood in his stool and his last bowel movement was 2 days ago. He denies any vomiting or shortness of breath.  Past Medical History  Diagnosis Date  . Arthritis   . Hypertension   . Diabetes mellitus   . Rectal cancer     Past Surgical History  Procedure Date  . Surgical excision of rectal cancer     Family History  Problem Relation Age of Onset  . Hypertension Other     History  Substance Use Topics  . Smoking status: Former Smoker    Types: Cigarettes  . Smokeless tobacco: Never Used  . Alcohol Use: Yes     heavy drinker, last alcohol was yesterday       Review of Systems ROS reviewed and otherwise negative except for mentioned in HPI  Allergies  Review of patient's allergies indicates no known allergies.  Home Medications   Current Outpatient Rx  Name Route Sig Dispense Refill  . AMLODIPINE BESYLATE 5 MG PO TABS Oral Take 5 mg by mouth every morning.    . ASPIRIN EC 81 MG PO TBEC Oral Take 81 mg by mouth daily.      Marland Kitchen  CARBOXYMETHYLCELLUL-GLYCERIN 0.5-0.9 % OP SOLN Ophthalmic Apply to eye.    Marland Kitchen CARBOXYMETHYLCELLULOSE SODIUM 0.5 % OP SOLN Both Eyes Place 1 drop into both eyes 2 (two) times daily as needed. For dry eyes. Morning and afternoon.    . CHLORTHALIDONE 25 MG PO TABS Oral Take 25 mg by mouth daily.    Marland Kitchen CIPROFLOXACIN HCL 0.3 % OP SOLN Left Eye Place 1 drop into the left eye every 2 (two) hours. Administer 1 drop, every 2 hours, while awake, for 2 days. Then 1 drop, every 4 hours, while awake, for the next 5 days.    . DEXLANSOPRAZOLE 60 MG PO CPDR Oral Take 60 mg by mouth daily.    Marland Kitchen KETOCONAZOLE 2 % EX CREA Topical Apply 1 application topically daily.     Marland Kitchen LISINOPRIL 10 MG PO TABS Oral Take 10 mg by mouth every morning.    . LUBIPROSTONE 24 MCG PO CAPS Oral Take 24 mcg by mouth 2 (two) times daily with a meal.    . METOCLOPRAMIDE HCL 10 MG PO TABS Oral Take 1 tablet (10 mg total) by mouth every 8 (eight) hours as needed (for nausea). 12 tablet 0  . SYSTANE BALANCE OP Both Eyes Place 1 drop into both eyes 2 (two) times daily.     . TRAMADOL HCL 50 MG PO TABS Oral Take 50 mg by mouth  every 4 (four) hours as needed. For pain. Maximum dose= 8 tablets per day      BP 127/78  Pulse 70  Temp(Src) 98.5 F (36.9 C) (Oral)  Resp 12  SpO2 95% Vitals reviewed Physical Exam Physical Examination: General appearance - alert, well appearing, and in no distress Mental status - alert, oriented to person, place, and time Eyes - pupils equal and reactive, extraocular eye movements intact Mouth - mucous membranes moist, pharynx normal without lesions Chest - clear to auscultation, no wheezes, rales or rhonchi, symmetric air entry Heart - normal rate, regular rhythm, normal S1, S2, no murmurs, rubs, clicks or gallops Abdomen - soft, nontender, nondistended, no masses or organomegaly, NABS Neurological - alert, oriented, normal speech, no focal findings Musculoskeletal - no joint tenderness, deformity or  swelling Extremities - peripheral pulses normal, no pedal edema, no clubbing or cyanosis Skin - normal coloration and turgor, no rashes  ED Course  Procedures (including critical care time)  Date: 08/10/2011  Rate: 89  Rhythm: normal sinus rhythm, premature atrial contractions  QRS Axis: normal  Intervals: normal  ST/T Wave abnormalities: nonspecific st and t wave changes  Conduction Disutrbances:none  Narrative Interpretation:   Old EKG Reviewed: unchanged from prior of 08/08/11    Labs Reviewed  CBC - Abnormal; Notable for the following:    RDW 15.9 (*)    All other components within normal limits  COMPREHENSIVE METABOLIC PANEL - Abnormal; Notable for the following:    Potassium 3.1 (*)    Glucose, Bld 106 (*)    BUN 5 (*)    Albumin 3.4 (*)    Total Bilirubin 1.4 (*)    GFR calc non Af Amer 89 (*)    All other components within normal limits  URINALYSIS, ROUTINE W REFLEX MICROSCOPIC - Abnormal; Notable for the following:    Bilirubin Urine SMALL (*)    Ketones, ur 15 (*)    Leukocytes, UA SMALL (*)    All other components within normal limits  URINE MICROSCOPIC-ADD ON - Abnormal; Notable for the following:    Squamous Epithelial / LPF FEW (*)    All other components within normal limits  LIPASE, BLOOD  POCT I-STAT TROPONIN I   Dg Chest 2 View  08/08/2011  *RADIOLOGY REPORT*  Clinical Data: Chest pain  CHEST - 2 VIEW  Comparison: 06/26/2011; 06/04/2011; 05/25/2011; 03/06/2010; 08/21/2007  Findings: Grossly unchanged enlarged cardiac silhouette and mediastinal contours.  Grossly unchanged left basilar heterogeneous opacities favored to represent atelectasis or scar.  No new focal airspace opacities.  No pleural effusion or pneumothorax.  Thoracic spine degenerative change.  No acute osseous abnormalities.  IMPRESSION:  Chronic left basilar atelectasis or scar.  No definite acute cardiopulmonary disease.  Original Report Authenticated By: Waynard Reeds, M.D.   Ct Chest Wo  Contrast  08/09/2011  *RADIOLOGY REPORT*  Clinical Data: Neck, chest and abdominal pain with recent worsening.  CT CHEST WITHOUT CONTRAST  Technique:  Multidetector CT imaging of the chest was performed following the standard protocol without IV contrast.  Comparison: Chest radiograph 08/08/2011.  CT chest 11/04/2008.  Findings: No pathologically enlarged mediastinal or axillary lymph nodes.  Hilar regions are difficult to definitively evaluate without IV contrast.  Ascending aorta measures 4.1 cm.  Pulmonary arteries are enlarged.  Coronary artery calcification.  Heart is at the upper limits of normal in size.  No pericardial effusion.  Mild paraseptal emphysema.  Mild dependent atelectasis in the lower lobes, right greater than  left.  No pleural fluid.  Adherent debris is seen in the left mainstem bronchus.  Incidental imaging of the upper abdomen shows no acute findings. No worrisome lytic or sclerotic lesions.  Degenerative changes are seen in the spine.  IMPRESSION:  1.  No findings to explain the patient's given symptoms. 2.  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  Original Report Authenticated By: Reyes Ivan, M.D.   Ct Cervical Spine Wo Contrast  08/09/2011  *RADIOLOGY REPORT*  Clinical Data: 71 year old male with pain radiating down the right to the chest.  CT CERVICAL SPINE WITHOUT CONTRAST  Technique:  Multidetector CT imaging of the cervical spine was performed. Multiplanar CT image reconstructions were also generated.  Comparison: 04/04/2008.  Findings: Calcified atherosclerosis at the skull base.  Stable visualized noncontrast brain parenchyma.  Dominant distal right vertebral artery. Visualized orbit soft tissues are within normal limits.  Visualized paranasal sinuses and mastoids are clear. Visualized paraspinal soft tissues are within normal limits. Stable visible scarring at the lung apices.  Overall stable cervical vertebral  height and alignment. Visualized skull base is intact.  No atlanto-occipital dissociation. Cervicothoracic junction alignment is within normal limits. Bilateral posterior element alignment is within normal limits. Chronic bulky cervical endplate spurring, maximal at C4-C5. Chronic cervical facet hypertrophy.  Chronic cervical disc space loss.  No acute cervical fracture.  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  IMPRESSION: 1. No acute fracture or listhesis identified in the cervical spine. Ligamentous injury is not excluded. 2.  Advanced chronic cervical degenerative changes including chronic mild to moderate spinal stenosis from C3-C4 to C6-C7.  Original Report Authenticated By: Harley Hallmark, M.D.   Ct Abdomen Pelvis W Contrast  08/09/2011  *RADIOLOGY REPORT*  Clinical Data: Chest pain and constipation.  CT ABDOMEN AND PELVIS WITH CONTRAST  Technique:  Multidetector CT imaging of the abdomen and pelvis was performed following the standard protocol during bolus administration of intravenous contrast.  Contrast:  100 ml Omnipaque-300  Comparison: 07/27/2011  Findings: Lung bases are clear.  No evidence of free air.  Decreased attenuation of the liver is consistent with hepatic steatosis.  There are scattered hyperdense nodular lesions throughout the liver.  These findings could be related to areas of fat sparing but there is also concern for underlying hepatic lesions.  There is mild distention of the gallbladder.  Portal venous system appears to be patent.  Mild dilatation of pancreatic duct which appears unchanged.  No gross abnormality to the spleen, adrenal glands or kidneys.  Normal appearance of the appendix.  Diverticula involving the left colon without acute inflammatory changes.  There is fluid throughout the colon. There is mild wall thickening at the junction of the rectum and sigmoid colon.  No gross abnormality to the prostate, seminal  vesicles or urinary bladder.  Stable small sclerotic density in the right iliac bone. No acute bony abnormality.  Small cyst in the right kidney lower pole.  IMPRESSION:  Scattered dense nodules throughout the liver.  The background liver is low density and related to hepatic steatosis. Reportedly, the patient has a history of rectal cancer and these findings raise concern for hepatic lesions.  Recommend further evaluation of the liver with MRI (with and without contrast) to differentiate liver lesions from fat sparing.  Cannot exclude wall thickening at the junction of the sigmoid colon and rectum.  This could be better evaluated with sigmoidoscopy.  These results  were called by telephone on 08/09/2011   at  12:37 a.m. to  [facility], who verbally acknowledged these results.  Original Report Authenticated By: Richarda Overlie, M.D.   Dg Abd Acute W/chest  08/08/2011  *RADIOLOGY REPORT*  Clinical Data: Evaluate for small bowel obstruction, pneumonia, free air  ACUTE ABDOMEN SERIES (ABDOMEN 2 VIEW & CHEST 1 VIEW)  Comparison: Chest radiograph - 08/08/2011; 05/15/2011; CT abdomen pelvis - 07/27/2011; 06/04/2011  Findings: Grossly unchanged enlarged cardiac silhouette and mediastinal contours.  Grossly unchanged left basilar heterogeneous opacities favored to represent atelectasis or scar.  There is mild gaseous distension of several loops of small bowel with index loop within the left upper abdominal quadrant measuring approximately 4 cm in diameter.  The upright radiograph demonstrates several air fluid levels with the mid hemiabdomen. There is a paucity of distal colonic gas.  No pneumoperitoneum, pneumatosis or portal venous gas.  Multilevel thoracolumbar spine degenerative change.  IMPRESSION: 1.  Findings concerning for developing small bowel obstruction.  2.  Chronic left basilar atelectasis/scar without definite acute cardiopulmonary disease.  Original Report Authenticated By: Waynard Reeds, M.D.      1. Abdominal pain       MDM  Per chart review, pt has had workup including labs, CT scan and he is to f/u with Dr. Elnoria Howard on Monday.  His ekg is unchanged today as are his labs with only mild hypokalemia.  Abdominal exam is benign with active bowel sounds. Upon talking with patient and family it seems that his primary concern was increased stomach gurgling and this is likely due to his initiation of bowel prep today.  He will continue bowel prep and f/u as directed with Dr. Elnoria Howard as previously planned.  He and family at bedside are agreeable with this plan.         Ethelda Chick, MD 08/10/11 1158

## 2011-08-10 NOTE — ED Provider Notes (Signed)
Medical screening examination/treatment/procedure(s) were conducted as a shared visit with non-physician practitioner(s) and myself.  I personally evaluated the patient during the encounter   Celene Kras, MD 08/10/11 1021

## 2011-08-10 NOTE — Discharge Instructions (Signed)
Return to the ED with any concerns including worsening abdominal pain, vomiting and not able to keep down liquids, difficulty breathing, worsening chest pain, fainting, decreased level of alertness or lethargy, or any other alarming symptoms.

## 2011-08-14 DIAGNOSIS — R198 Other specified symptoms and signs involving the digestive system and abdomen: Secondary | ICD-10-CM | POA: Diagnosis not present

## 2011-08-14 DIAGNOSIS — K649 Unspecified hemorrhoids: Secondary | ICD-10-CM | POA: Diagnosis not present

## 2011-08-14 DIAGNOSIS — R933 Abnormal findings on diagnostic imaging of other parts of digestive tract: Secondary | ICD-10-CM | POA: Diagnosis not present

## 2011-08-16 ENCOUNTER — Ambulatory Visit
Admission: RE | Admit: 2011-08-16 | Discharge: 2011-08-16 | Disposition: A | Discharge status: Home / Self Care | Payer: Medicare Other | Source: Ambulatory Visit | Attending: Gastroenterology | Admitting: Gastroenterology

## 2011-08-16 DIAGNOSIS — Z85048 Personal history of other malignant neoplasm of rectum, rectosigmoid junction, and anus: Secondary | ICD-10-CM

## 2011-08-16 DIAGNOSIS — R109 Unspecified abdominal pain: Secondary | ICD-10-CM

## 2011-08-16 DIAGNOSIS — K7689 Other specified diseases of liver: Secondary | ICD-10-CM | POA: Diagnosis not present

## 2011-08-16 MED ORDER — GADOXETATE DISODIUM 0.25 MMOL/ML IV SOLN: 9.0000 mL | Freq: Once | INTRAVENOUS | Status: AC | PRN

## 2011-08-21 ENCOUNTER — Ambulatory Visit: Payer: Medicare Other

## 2011-08-22 ENCOUNTER — Ambulatory Visit
Admission: RE | Admit: 2011-08-22 | Discharge: 2011-08-22 | Disposition: A | Discharge status: Home / Self Care | Payer: Medicare Other | Source: Ambulatory Visit | Attending: Family Medicine | Admitting: Family Medicine

## 2011-08-22 ENCOUNTER — Ambulatory Visit (INDEPENDENT_AMBULATORY_CARE_PROVIDER_SITE_OTHER): Payer: Medicare Other | Admitting: Family Medicine

## 2011-08-22 VITALS — BP 154/87 | HR 86 | Temp 98.6°F | Resp 16 | Ht 69.75 in | Wt 199.4 lb

## 2011-08-22 DIAGNOSIS — R51 Headache: Secondary | ICD-10-CM

## 2011-08-22 DIAGNOSIS — R519 Headache, unspecified: Secondary | ICD-10-CM

## 2011-08-22 DIAGNOSIS — K219 Gastro-esophageal reflux disease without esophagitis: Secondary | ICD-10-CM

## 2011-08-22 DIAGNOSIS — Z043 Encounter for examination and observation following other accident: Secondary | ICD-10-CM | POA: Diagnosis not present

## 2011-08-22 MED ORDER — SUCRALFATE 1 GM/10ML PO SUSP: 1.0000 g | Freq: Four times a day (QID) | ORAL | Status: DC

## 2011-08-22 MED ORDER — TOPIRAMATE 50 MG PO TABS: ORAL_TABLET | ORAL | Status: DC

## 2011-08-22 NOTE — Patient Instructions (Signed)
Come back Friday.

## 2011-08-22 NOTE — Progress Notes (Signed)
71 yo retired man who fell about 5 months and hit head on the ground. Had loss of consciousness and abrasion on face.  No CT done. Sought medical care in [place], but was told he did not have a serious problem.  He has had intermittent headaches since but they've gotten worse in past couple weeks.  Some are sharp and some are dull, last 30 to 45 minutes.  No weakness.  Associated with blurry vision (eye doctor noted cataracts and prescribed eye drops).  No dizziness.    Also patient complains of stomach "grumbling" for a couple months.  Ran out of Dexilant.  Tried Pepcid AC which helped a little.  Notes some constipation.  Took MOM and hot coffee last night.  Colonoscopy done 2 weeks ago.  He told the GI doc about the epigastric problems and was given some sample medicine which didn't help.  (Endoscopy was not performed) -[facility]. Patient also notes significant reflux with inability to lie flat at night and waterbrash that wakes him up.    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  Recommend MRI.

## 2011-08-25 ENCOUNTER — Ambulatory Visit (INDEPENDENT_AMBULATORY_CARE_PROVIDER_SITE_OTHER): Payer: Medicare Other | Admitting: Family Medicine

## 2011-08-25 VITALS — BP 99/61 | HR 90 | Temp 98.2°F | Resp 16 | Ht 70.0 in | Wt 199.0 lb

## 2011-08-25 DIAGNOSIS — R51 Headache: Secondary | ICD-10-CM | POA: Diagnosis not present

## 2011-08-25 DIAGNOSIS — G44309 Post-traumatic headache, unspecified, not intractable: Secondary | ICD-10-CM

## 2011-08-25 NOTE — Progress Notes (Signed)
71 yo retired man who fell about 5 months and hit head on the ground. Had loss of consciousness and abrasion on face.  Sought medical care in [place], but was told he did not have a serious problem. He has had intermittent headaches since but they've gotten worse in past couple weeks. Some are sharp and some are dull, last 30 to 45 minutes. No weakness. Associated with blurry vision (eye doctor noted cataracts and prescribed eye drops). No dizziness.  CT scan done earlier this week showed small pontine scar.  Patient started on topamax and is sleeping well.  Headaches continue and are mild and dull, and are intermittent.  Overall, no new sx.  Pain when turning head to left.  O:  Alert and in no distress. Vision is appropriate and friendly.  Cranial nerves III through XII intact  Patient is strong grasps full range of motion both arms with good strength.  Negative Romberg with good strength in legs.  Chest clear heart regular with occasional skipped beat no murmur  Assessment: Stable, mild headaches continue I think it's reasonable to give the Topamax another week.  Plan recheck one week and continue the Topamax.

## 2011-08-30 ENCOUNTER — Ambulatory Visit (INDEPENDENT_AMBULATORY_CARE_PROVIDER_SITE_OTHER): Payer: Medicare Other | Admitting: Cardiovascular Disease

## 2011-08-30 ENCOUNTER — Encounter: Payer: Self-pay | Admitting: Cardiovascular Disease

## 2011-08-30 DIAGNOSIS — R079 Chest pain, unspecified: Secondary | ICD-10-CM | POA: Diagnosis not present

## 2011-08-30 DIAGNOSIS — I10 Essential (primary) hypertension: Secondary | ICD-10-CM | POA: Diagnosis not present

## 2011-08-30 NOTE — Progress Notes (Signed)
   HPI:  71 year old gentleman presenting for followup evaluation.  The patient has a history of chest pain with typical and atypical features. His pain has improved. He still has some "indigestion." He underwent cardiac catheterization in January demonstrating widely patent coronary arteries. He's been having difficulty with headaches recently and has undergone evaluation for this. His headaches are improved after starting Topamax. He denies orthopnea, PND, palpitations, or edema.  Outpatient Encounter Prescriptions as of 08/30/2011  Medication Sig Dispense Refill  . amLODipine (NORVASC) 5 MG tablet Take 5 mg by mouth every morning.      Marland Kitchen aspirin EC 81 MG tablet Take 81 mg by mouth daily.        . Carboxymethylcellul-Glycerin (OPTIVE) 0.5-0.9 % SOLN Apply to eye.      . carboxymethylcellulose (REFRESH PLUS) 0.5 % SOLN Place 1 drop into both eyes 2 (two) times daily as needed. For dry eyes. Morning and afternoon.      Marland Kitchen dexlansoprazole (DEXILANT) 60 MG capsule Take 60 mg by mouth daily.      Marland Kitchen ketoconazole (NIZORAL) 2 % cream Apply 1 application topically daily.       Marland Kitchen lisinopril (PRINIVIL,ZESTRIL) 10 MG tablet Take 10 mg by mouth every morning.      . lubiprostone (AMITIZA) 24 MCG capsule Take 24 mcg by mouth 2 (two) times daily with a meal.      . Propylene Glycol (SYSTANE BALANCE OP) Place 1 drop into both eyes 2 (two) times daily.       . sucralfate (CARAFATE) 1 GM/10ML suspension Take 10 mLs (1 g total) by mouth 4 (four) times daily.  420 mL  0  . topiramate (TOPAMAX) 50 MG tablet 1 qhs  30 tablet  0  . traMADol (ULTRAM) 50 MG tablet Take 50 mg by mouth every 4 (four) hours as needed. For pain. Maximum dose= 8 tablets per day      . DISCONTD: chlorthalidone (HYGROTON) 25 MG tablet Take 25 mg by mouth daily.      Marland Kitchen DISCONTD: ciprofloxacin (CILOXAN) 0.3 % ophthalmic solution Place 1 drop into the left eye every 2 (two) hours. Administer 1 drop, every 2 hours, while awake, for 2 days. Then 1  drop, every 4 hours, while awake, for the next 5 days.        No Known Allergies  Past Medical History  Diagnosis Date  . Arthritis   . Hypertension   . Diabetes mellitus   . Rectal cancer     ROS: Negative except as per HPI  BP 120/82  Pulse 84  Ht 5\' 11"  (1.803 m)  Wt 92.987 kg (205 lb)  BMI 28.59 kg/m2  PHYSICAL EXAM: Pt is alert and oriented, NAD HEENT: normal Neck: JVP - normal, carotids 2+= without bruits Lungs: CTA bilaterally CV: RRR without murmur or gallop Abd: soft, NT, Positive BS, no hepatomegaly Ext: no C/C/E, distal pulses intact and equal Skin: warm/dry no rash  ASSESSMENT AND PLAN:

## 2011-08-30 NOTE — Patient Instructions (Signed)
Your physician recommends that you schedule a follow-up appointment as needed.   Your physician recommends that you continue on your current medications as directed. Please refer to the Current Medication list given to you today.  

## 2011-08-30 NOTE — Assessment & Plan Note (Signed)
The patient has had noncardiac chest pain. He was again reassured about his coronary status. I will be happy to see him back in the future if problems arise.

## 2011-08-30 NOTE — Assessment & Plan Note (Signed)
Blood pressure is well-controlled on amlodipine and lisinopril. Continue the same.

## 2011-09-01 ENCOUNTER — Ambulatory Visit: Payer: Medicare Other

## 2011-09-04 ENCOUNTER — Encounter: Payer: Self-pay | Admitting: Family Medicine

## 2011-09-04 ENCOUNTER — Ambulatory Visit (INDEPENDENT_AMBULATORY_CARE_PROVIDER_SITE_OTHER): Payer: Medicare Other | Admitting: Family Medicine

## 2011-09-04 VITALS — BP 145/80 | HR 84 | Temp 98.2°F | Resp 16 | Ht 70.0 in | Wt 196.6 lb

## 2011-09-04 DIAGNOSIS — G44309 Post-traumatic headache, unspecified, not intractable: Secondary | ICD-10-CM | POA: Diagnosis not present

## 2011-09-04 DIAGNOSIS — S0990XS Unspecified injury of head, sequela: Secondary | ICD-10-CM

## 2011-09-04 DIAGNOSIS — IMO0001 Reserved for inherently not codable concepts without codable children: Secondary | ICD-10-CM | POA: Diagnosis not present

## 2011-09-04 MED ORDER — TRAMADOL HCL 50 MG PO TABS: 50.0000 mg | ORAL_TABLET | Freq: Three times a day (TID) | ORAL | Status: DC | PRN

## 2011-09-04 MED ORDER — TOPIRAMATE 100 MG PO TABS: 100.0000 mg | ORAL_TABLET | Freq: Every day | ORAL | Status: DC

## 2011-09-04 NOTE — Progress Notes (Signed)
71 year old man is here for recheck on his headaches. He's still having about 5/10 in severity headaches daily. He says the tramadol is controlling them when he needs to take them once or twice a day. He says overall the headaches are better on the Topamax but he still does have daily headaches. He's noticed no new weakness or numbness or unsteadiness of gait. No change in vision.  He says that his stomach problems are related by the sucralfate.  Objective: No acute distress  Cranial nerves III through XII are intact  Romberg is negative  Motor exam shows good symmetric strength of the legs and arms with good graft as well.  Assessment stable with chronic daily headaches plan plan: Recheck in 2 weeks  Increase Topamax to 100 mg daily and refill the tramadol.

## 2011-09-18 ENCOUNTER — Encounter: Payer: Medicare Other | Admitting: Physician Assistant

## 2011-09-19 ENCOUNTER — Ambulatory Visit (INDEPENDENT_AMBULATORY_CARE_PROVIDER_SITE_OTHER): Payer: Medicare Other | Admitting: Family Medicine

## 2011-09-19 VITALS — BP 143/84 | HR 73 | Temp 97.9°F | Resp 16 | Ht 70.0 in | Wt 188.0 lb

## 2011-09-19 DIAGNOSIS — G44309 Post-traumatic headache, unspecified, not intractable: Secondary | ICD-10-CM

## 2011-09-19 DIAGNOSIS — J329 Chronic sinusitis, unspecified: Secondary | ICD-10-CM | POA: Diagnosis not present

## 2011-09-19 DIAGNOSIS — S0990XS Unspecified injury of head, sequela: Secondary | ICD-10-CM

## 2011-09-19 DIAGNOSIS — IMO0001 Reserved for inherently not codable concepts without codable children: Secondary | ICD-10-CM

## 2011-09-19 MED ORDER — AMOXICILLIN 875 MG PO TABS: 875.0000 mg | ORAL_TABLET | Freq: Two times a day (BID) | ORAL | Status: AC

## 2011-09-19 NOTE — Patient Instructions (Signed)

## 2011-09-19 NOTE — Progress Notes (Signed)
71 year old man is here for recheck on his headaches. He's still having about 6/10 in severity headaches daily. He says the tramadol is controlling them when he needs to take them once or twice a day. He says overall the headaches are better on the Topamax but he still does have daily headaches. He's noticed no new weakness or numbness or unsteadiness of gait. No change in vision  He says that pain is tolerable.  He has developed some sinus congestion about 2 weeks.  O:  Alert  HEENT:  Mucopurulent discharge both nasal passages, ears normal, poor dentition but clear pharynx Neck:  Supple, no adenopathy Chest: clear  A:  Stable re: headaches, suspect sinusitis  P:  amoxiciliin headaches

## 2011-10-23 ENCOUNTER — Ambulatory Visit (INDEPENDENT_AMBULATORY_CARE_PROVIDER_SITE_OTHER): Payer: Medicare Other | Admitting: Internal Medicine

## 2011-10-23 VITALS — BP 131/79 | HR 78 | Temp 97.5°F | Resp 16 | Ht 70.0 in | Wt 193.0 lb

## 2011-10-23 DIAGNOSIS — K219 Gastro-esophageal reflux disease without esophagitis: Secondary | ICD-10-CM

## 2011-10-23 DIAGNOSIS — R51 Headache: Secondary | ICD-10-CM

## 2011-10-23 DIAGNOSIS — K760 Fatty (change of) liver, not elsewhere classified: Secondary | ICD-10-CM

## 2011-10-23 DIAGNOSIS — C20 Malignant neoplasm of rectum: Secondary | ICD-10-CM

## 2011-10-23 DIAGNOSIS — F17200 Nicotine dependence, unspecified, uncomplicated: Secondary | ICD-10-CM | POA: Insufficient documentation

## 2011-10-23 DIAGNOSIS — R109 Unspecified abdominal pain: Secondary | ICD-10-CM | POA: Insufficient documentation

## 2011-10-23 DIAGNOSIS — E876 Hypokalemia: Secondary | ICD-10-CM

## 2011-10-23 DIAGNOSIS — I10 Essential (primary) hypertension: Secondary | ICD-10-CM | POA: Diagnosis not present

## 2011-10-23 DIAGNOSIS — M4802 Spinal stenosis, cervical region: Secondary | ICD-10-CM | POA: Diagnosis not present

## 2011-10-23 DIAGNOSIS — F1011 Alcohol abuse, in remission: Secondary | ICD-10-CM

## 2011-10-23 DIAGNOSIS — A048 Other specified bacterial intestinal infections: Secondary | ICD-10-CM

## 2011-10-23 DIAGNOSIS — G44309 Post-traumatic headache, unspecified, not intractable: Secondary | ICD-10-CM

## 2011-10-23 LAB — POCT CBC
Granulocyte percent: 37.7 %G (ref 37–80)
HCT, POC: 47.3 % (ref 43.5–53.7)
MID (cbc): 0.5 (ref 0–0.9)
MPV: 6.3 fL (ref 0–99.8)
POC Granulocyte: 2 (ref 2–6.9)
POC LYMPH PERCENT: 53.6 %L — AB (ref 10–50)
POC MID %: 8.7 %M (ref 0–12)
Platelet Count, POC: 315 10*3/uL (ref 142–424)
RDW, POC: 16.1 %

## 2011-10-23 LAB — COMPREHENSIVE METABOLIC PANEL
ALT: 26 U/L (ref 0–53)
AST: 35 U/L (ref 0–37)
Albumin: 4.6 g/dL (ref 3.5–5.2)
Alkaline Phosphatase: 127 U/L — ABNORMAL HIGH (ref 39–117)
Potassium: 3.5 mEq/L (ref 3.5–5.3)
Sodium: 136 mEq/L (ref 135–145)
Total Bilirubin: 0.7 mg/dL (ref 0.3–1.2)
Total Protein: 7.6 g/dL (ref 6.0–8.3)

## 2011-10-23 LAB — LIPID PANEL
LDL Cholesterol: 74 mg/dL (ref 0–99)
Total CHOL/HDL Ratio: 3.1 Ratio
VLDL: 24 mg/dL (ref 0–40)

## 2011-10-23 MED ORDER — TRAMADOL HCL 50 MG PO TABS: 50.0000 mg | ORAL_TABLET | Freq: Three times a day (TID) | ORAL | Status: DC | PRN

## 2011-10-23 NOTE — Progress Notes (Signed)
Subjective:    Patient ID: Andre [name], male    DOB: 06/29/1940, 71 y.o.   MRN: [id number]  HPIPresents today for followup of headaches He has a daily right frontal headache that is on-and-off all day long and will occasionally wake him at night. There no specific vision changes noted with this. He does not complain of dizziness. He does not get nauseated. It is a sharp piercing pain that has not changed since he was last evaluated by Dr. Milus Glazier. In retrospect his daily headaches have not changed while on 100 mg of Topamax. He gets relief from Ultram and is taking at least 2 doses daily. These headaches started after a head injury around November 2012 which produced 4 hours of amnesia/confusion.  His last lab work in February of 2013 revealed a potassium of 3.0.  His problem list was updated today due to his multiple problems and multiple recent issues Patient Active Problem List  Diagnoses  . Chest pain-Dr. Cooper/full workup negative  . Palpitations  . Essential hypertension, benign  . Headache-As above  . Rectal cancer-dr hung-next colonoscopy fall 2013  . Abdominal pain, recurrent-Dr Leonia Reader  . GERD (gastroesophageal reflux disease)-Stable on meds  . Nicotine addiction  . Alcohol abuse, in remission-Drinking once a week  . Cervical stenosis of spinal canal-Continues with neck pain/wakes at night with neck pain/occasional paresthesias in hands but no constant symptoms and no change in grip strength  . Steatosis of liver-Past history of hepatitis B/no current therapy/no recent lipid profile  . H. pylori infection-Status post Prevpac December 2012       Review of Systems  Constitutional: Negative for appetite change, fatigue and unexpected weight change.  HENT: Positive for neck pain and neck stiffness.   Genitourinary: Negative for frequency and difficulty urinating.  Neurological: Negative for dizziness, tremors, syncope, speech difficulty, weakness and light-headedness.   Psychiatric/Behavioral: Negative for confusion and decreased concentration.       His sleep pattern is somewhat irregular as he right leg some of headaches or he wakes to urinate and he doesn't have a set bedtime       Objective:   Physical Exam Blood pressure 131/79 and Pupils equal round reactive to light and accommodation/extraocular movements conjugate/ Neck has restricted range of motion due to pain chiefly in the right paracervical area Cranial nerves II through XII are intact Upper extremity motor and sensory intact Gait intact Romberg negative DTRs symmetrical       Results for orders placed in visit on 10/23/11  POCT CBC      Component Value Range   WBC 5.2  4.6 - 10.2 (K/uL)   Lymph, poc 2.8  0.6 - 3.4    POC LYMPH PERCENT 53.6 (*) 10 - 50 (%L)   MID (cbc) 0.5  0 - 0.9    POC MID % 8.7  0 - 12 (%M)   POC Granulocyte 2.0  2 - 6.9    Granulocyte percent 37.7  37 - 80 (%G)   RBC 5.06  4.69 - 6.13 (M/uL)   Hemoglobin 15.5  14.1 - 18.1 (g/dL)   HCT, POC 78.2  95.6 - 53.7 (%)   MCV 93.5  80 - 97 (fL)   MCH, POC 30.6  27 - 31.2 (pg)   MCHC 32.8  31.8 - 35.4 (g/dL)   RDW, POC 21.3     Platelet Count, POC 315  142 - 424 (K/uL)   MPV 6.3  0 - 99.8 (fL)  Assessment & Plan:  Problem #1 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

## 2011-10-26 ENCOUNTER — Telehealth: Payer: Self-pay

## 2011-10-26 DIAGNOSIS — G44309 Post-traumatic headache, unspecified, not intractable: Secondary | ICD-10-CM

## 2011-10-26 NOTE — Telephone Encounter (Signed)
Pt requesting refill of his tramadol he says that is the only thing that helps his head [phone]

## 2011-10-27 NOTE — Telephone Encounter (Signed)
Patient was just given 60 tabs on the 13th, with a sig of max 8 tabs per day. Why is patient needing a refill already?

## 2011-10-28 NOTE — Telephone Encounter (Signed)
PT STATES HE HAS ABOUT 4 OR 5 MORE LEFT. STATES HE IS TAKING IT 4 TIMES A DAY.  TOLD HIM WE MAY NOT CALL IN ANY MORE FOR A COUPLE MORE DAYS.

## 2011-10-28 NOTE — Telephone Encounter (Signed)
Dr. Merla Riches, do you want to refill?

## 2011-10-29 MED ORDER — TRAMADOL HCL 50 MG PO TABS: 50.0000 mg | ORAL_TABLET | Freq: Three times a day (TID) | ORAL | Status: DC | PRN

## 2011-10-29 NOTE — Telephone Encounter (Signed)
Patient notified

## 2011-10-29 NOTE — Telephone Encounter (Signed)
Spoke to patient. He would like a refill called in May 20th that Dr. Merla Riches agreed to. Thanks.

## 2011-10-29 NOTE — Telephone Encounter (Signed)
rx sent

## 2011-10-29 NOTE — Telephone Encounter (Addendum)
He has been referred to neurology for evaluation of these headaches His medication was the last for 7 days We could give him one refill but not until May 20

## 2011-10-29 NOTE — Telephone Encounter (Signed)
Addended by: Fernande Bras on: 10/29/2011 11:04 AM   Modules accepted: Orders

## 2011-10-31 ENCOUNTER — Encounter: Payer: Self-pay | Admitting: Internal Medicine

## 2011-11-01 ENCOUNTER — Ambulatory Visit (INDEPENDENT_AMBULATORY_CARE_PROVIDER_SITE_OTHER): Payer: Medicare Other | Admitting: Family Medicine

## 2011-11-01 ENCOUNTER — Encounter: Payer: Self-pay | Admitting: Cardiovascular Disease

## 2011-11-01 ENCOUNTER — Encounter: Payer: Self-pay | Admitting: Family Medicine

## 2011-11-01 VITALS — BP 155/83 | HR 73 | Temp 98.2°F | Resp 16 | Ht 71.0 in | Wt 197.0 lb

## 2011-11-01 DIAGNOSIS — G44309 Post-traumatic headache, unspecified, not intractable: Secondary | ICD-10-CM

## 2011-11-01 DIAGNOSIS — G44209 Tension-type headache, unspecified, not intractable: Secondary | ICD-10-CM | POA: Diagnosis not present

## 2011-11-01 MED ORDER — TOPIRAMATE 100 MG PO TABS: ORAL_TABLET | ORAL | Status: DC

## 2011-11-01 NOTE — Progress Notes (Signed)
71 yo retired man who fell about 7 months and hit head on the ground. Had loss of consciousness and abrasion on face. Sought medical care in [place], but was told he did not have a serious problem. He has had intermittent headaches since but they've gotten worse in past couple weeks. They are not as sharp and most are dull, last 15 to 20 minutes. No weakness. Associated with blurry vision (eye doctor noted cataracts and prescribed eye drops). No dizziness.  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 Patient ran out of topamax and is sleeping well. Headaches continue and are mild and dull, and are intermittent. Overall, no new sx. Pain worse when turning head to left.   O: Alert and in no distress. Vision is appropriate and friendly.  Cranial nerves III through XII intact  Patient is strong grasps full range of motion both arms with good strength.  Negative Romberg with good strength in legs.  Chest clear heart regular with occasional skipped beat no murmur  Head is nontender.  Assessment: Stable, mild headaches continue I think it's reasonable to give the Topamax a refill  Plan refill the Topamax 100 qhs and recheck in a month.  Tramadol for acute pain that does not resolve promptly.

## 2011-11-01 NOTE — Patient Instructions (Signed)

## 2011-11-28 DIAGNOSIS — R5381 Other malaise: Secondary | ICD-10-CM | POA: Diagnosis not present

## 2011-11-28 DIAGNOSIS — R5383 Other fatigue: Secondary | ICD-10-CM | POA: Diagnosis not present

## 2011-11-28 DIAGNOSIS — R51 Headache: Secondary | ICD-10-CM | POA: Diagnosis not present

## 2011-12-04 ENCOUNTER — Other Ambulatory Visit: Payer: Self-pay | Admitting: Neurology

## 2011-12-04 DIAGNOSIS — R531 Weakness: Secondary | ICD-10-CM

## 2011-12-04 DIAGNOSIS — G44009 Cluster headache syndrome, unspecified, not intractable: Secondary | ICD-10-CM

## 2011-12-11 ENCOUNTER — Ambulatory Visit: Payer: Medicare Other

## 2011-12-11 ENCOUNTER — Ambulatory Visit (INDEPENDENT_AMBULATORY_CARE_PROVIDER_SITE_OTHER): Payer: Medicare Other | Admitting: Family Medicine

## 2011-12-11 VITALS — HR 74 | Temp 98.2°F | Resp 17 | Ht 71.0 in | Wt 187.0 lb

## 2011-12-11 DIAGNOSIS — K219 Gastro-esophageal reflux disease without esophagitis: Secondary | ICD-10-CM

## 2011-12-11 DIAGNOSIS — R079 Chest pain, unspecified: Secondary | ICD-10-CM | POA: Diagnosis not present

## 2011-12-11 DIAGNOSIS — G44309 Post-traumatic headache, unspecified, not intractable: Secondary | ICD-10-CM

## 2011-12-11 DIAGNOSIS — S0990XS Unspecified injury of head, sequela: Secondary | ICD-10-CM

## 2011-12-11 DIAGNOSIS — I10 Essential (primary) hypertension: Secondary | ICD-10-CM

## 2011-12-11 DIAGNOSIS — R51 Headache: Secondary | ICD-10-CM | POA: Diagnosis not present

## 2011-12-11 LAB — POCT CBC
HCT, POC: 45.7 % (ref 43.5–53.7)
Hemoglobin: 14 g/dL — AB (ref 14.1–18.1)
Lymph, poc: 2.3 (ref 0.6–3.4)
MCH, POC: 30.4 pg (ref 27–31.2)
MCHC: 30.6 g/dL — AB (ref 31.8–35.4)
MPV: 6.6 fL (ref 0–99.8)
RBC: 4.61 M/uL — AB (ref 4.69–6.13)
WBC: 5.3 10*3/uL (ref 4.6–10.2)

## 2011-12-11 MED ORDER — LISINOPRIL 10 MG PO TABS: 10.0000 mg | ORAL_TABLET | Freq: Every morning | ORAL | Status: DC

## 2011-12-11 MED ORDER — TRAMADOL HCL 50 MG PO TABS: 50.0000 mg | ORAL_TABLET | Freq: Three times a day (TID) | ORAL | Status: DC | PRN

## 2011-12-11 MED ORDER — TOPIRAMATE 100 MG PO TABS: ORAL_TABLET | ORAL | Status: DC

## 2011-12-11 MED ORDER — PANTOPRAZOLE SODIUM 40 MG PO TBEC: DELAYED_RELEASE_TABLET | ORAL | Status: DC

## 2011-12-11 NOTE — Progress Notes (Signed)
Subjective: Andre [name] continues to have some problems. His head continues to hurt primarily on the right side of his head.. he fell and hit the right side of his hand almost 9 months ago. He is continued to be a problem since then. When he takes the tramadol he gets some pain relief. The pain comes and goes. He does have an appointment to see a hand Dr. in a few weeks. He is out of the tramadol.  He also has been having more problems with reflux and chest pain from that. He gets some shortness of breath but that is with exertion, not from the pain. He does not taste any food coming up into his mouth. The symptoms are worse when he eats fried or fatty foods during the day time. Otherwise he has no GI complaints, with normal bowel functions. There is no radiation of the pain into his arms. He takes his blood pressure medicine faithfully and the pressure is good today. He does continue to smoke some.  Objective: Alert oriented male in no acute distress. TMs normal. Throat clear. Neck supple without nodes or thyromegaly. Chest is clear to auscultation. Heart regular without murmurs gallops arrhythmias. Abdomen soft without masses or tenderness. Extremities without edema.  EKG had no acute changes  [facility] reading (PRIMARY) by  Dr. Alwyn Ren Prominent pulmonary markings but no acute infiltrates or mass lesions.  Results for orders placed in visit on 12/11/11  POCT CBC      Component Value Range   WBC 5.3  4.6 - 10.2 K/uL   Lymph, poc 2.3  0.6 - 3.4   POC LYMPH PERCENT 42.8  10 - 50 %L   MID (cbc) 0.3  0 - 0.9   POC MID % 6.0  0 - 12 %M   POC Granulocyte 2.7  2 - 6.9   Granulocyte percent 51.2  37 - 80 %G   RBC 4.61 (*) 4.69 - 6.13 M/uL   Hemoglobin 14.0 (*) 14.1 - 18.1 g/dL   HCT, POC 40.9  81.1 - 53.7 %   MCV 99.2 (*) 80 - 97 fL   MCH, POC 30.4  27 - 31.2 pg   MCHC 30.6 (*) 31.8 - 35.4 g/dL   RDW, POC 91.4     Platelet Count, POC 231  142 - 424 K/uL   MPV 6.6  0 - 99.8 fL    Assessment: Posttraumatic headache, persistent. GERD Probable COPD on chest x-ray Hypertension controlled Chest pain   Plan: Refill the tramadol Try some pantoprazole for his GERD  Keep his point with his head Dr. It is medication not helping at all we will need to refer him for a GI workup also for this persisting GERD.Marland Kitchen

## 2011-12-11 NOTE — Patient Instructions (Signed)
Take the new medicine in addition to your previous medicines for the reflux (pantoprazole)  Keep your appointment with the head doctor. I am giving you some more pain pills to use in the meanwhile.  Continue your other medicines, and return in about 3 months unless you're having problems before that

## 2011-12-17 ENCOUNTER — Other Ambulatory Visit: Payer: Self-pay | Admitting: *Deleted

## 2011-12-17 MED ORDER — AMOXICILLIN 500 MG PO CAPS: 1000.0000 mg | ORAL_CAPSULE | Freq: Two times a day (BID) | ORAL | Status: AC

## 2011-12-17 MED ORDER — LANSOPRAZOLE 30 MG PO CPDR: 30.0000 mg | DELAYED_RELEASE_CAPSULE | Freq: Two times a day (BID) | ORAL | Status: DC

## 2011-12-17 MED ORDER — CLARITHROMYCIN 500 MG PO TABS: 500.0000 mg | ORAL_TABLET | Freq: Two times a day (BID) | ORAL | Status: AC

## 2011-12-18 ENCOUNTER — Other Ambulatory Visit: Payer: Self-pay | Admitting: Physician Assistant

## 2011-12-18 NOTE — Progress Notes (Signed)
Filled out prior auth form for pt to get prevacid covered for + H. Pylori and faxed to ins.

## 2011-12-19 NOTE — Progress Notes (Signed)
Received approval for prevacid and faxed approval to pharmacy.

## 2011-12-27 ENCOUNTER — Ambulatory Visit
Admission: RE | Admit: 2011-12-27 | Discharge: 2011-12-27 | Disposition: A | Discharge status: Home / Self Care | Payer: Medicare Other | Source: Ambulatory Visit | Attending: Neurology | Admitting: Neurology

## 2011-12-27 DIAGNOSIS — R531 Weakness: Secondary | ICD-10-CM

## 2011-12-27 DIAGNOSIS — R5381 Other malaise: Secondary | ICD-10-CM | POA: Diagnosis not present

## 2011-12-27 DIAGNOSIS — R51 Headache: Secondary | ICD-10-CM | POA: Diagnosis not present

## 2011-12-27 DIAGNOSIS — G44009 Cluster headache syndrome, unspecified, not intractable: Secondary | ICD-10-CM

## 2011-12-27 DIAGNOSIS — R29818 Other symptoms and signs involving the nervous system: Secondary | ICD-10-CM | POA: Diagnosis not present

## 2011-12-27 MED ORDER — GADOBENATE DIMEGLUMINE 529 MG/ML IV SOLN
18.0000 mL | Freq: Once | INTRAVENOUS | Status: AC | PRN
  Administered 2011-12-27: 18 mL via INTRAVENOUS

## 2012-01-01 DIAGNOSIS — R51 Headache: Secondary | ICD-10-CM | POA: Diagnosis not present

## 2012-01-01 DIAGNOSIS — R5383 Other fatigue: Secondary | ICD-10-CM | POA: Diagnosis not present

## 2012-01-01 DIAGNOSIS — R5381 Other malaise: Secondary | ICD-10-CM | POA: Diagnosis not present

## 2012-01-02 ENCOUNTER — Other Ambulatory Visit: Payer: Self-pay | Admitting: Physician Assistant

## 2012-01-25 ENCOUNTER — Other Ambulatory Visit: Payer: Self-pay | Admitting: Physician Assistant

## 2012-02-01 DIAGNOSIS — E119 Type 2 diabetes mellitus without complications: Secondary | ICD-10-CM | POA: Diagnosis not present

## 2012-02-01 DIAGNOSIS — H25019 Cortical age-related cataract, unspecified eye: Secondary | ICD-10-CM | POA: Diagnosis not present

## 2012-02-01 DIAGNOSIS — H2510 Age-related nuclear cataract, unspecified eye: Secondary | ICD-10-CM | POA: Diagnosis not present

## 2012-02-02 ENCOUNTER — Other Ambulatory Visit: Payer: Self-pay | Admitting: Cardiovascular Disease

## 2012-02-15 NOTE — Progress Notes (Signed)
This encounter was created in error - please disregard.

## 2012-02-18 ENCOUNTER — Other Ambulatory Visit: Payer: Self-pay | Admitting: Physician Assistant

## 2012-02-27 NOTE — Progress Notes (Signed)
This encounter was created in error - please disregard.

## 2012-03-15 ENCOUNTER — Other Ambulatory Visit: Payer: Self-pay | Admitting: Physician Assistant

## 2012-03-18 ENCOUNTER — Ambulatory Visit: Payer: Medicare Other

## 2012-03-18 ENCOUNTER — Ambulatory Visit: Payer: Medicare Other | Admitting: Family Medicine

## 2012-03-18 VITALS — BP 118/70 | HR 75 | Temp 97.4°F | Resp 16 | Ht 71.0 in | Wt 186.0 lb

## 2012-03-18 DIAGNOSIS — R079 Chest pain, unspecified: Secondary | ICD-10-CM

## 2012-03-18 DIAGNOSIS — K219 Gastro-esophageal reflux disease without esophagitis: Secondary | ICD-10-CM | POA: Diagnosis not present

## 2012-03-18 DIAGNOSIS — R519 Headache, unspecified: Secondary | ICD-10-CM

## 2012-03-18 DIAGNOSIS — R51 Headache: Secondary | ICD-10-CM | POA: Diagnosis not present

## 2012-03-18 MED ORDER — PANTOPRAZOLE SODIUM 40 MG PO TBEC: DELAYED_RELEASE_TABLET | ORAL | Status: DC

## 2012-03-18 MED ORDER — TRAMADOL HCL 50 MG PO TABS: ORAL_TABLET | ORAL | Status: DC

## 2012-03-18 NOTE — Progress Notes (Signed)
Subjective: Patient been having problems in the last couple of weeks with a intermittent substernal chest pain. This usually happens when he is at work the rest. He's been treated for GERD and reflux. He just has a little bit of discomfort there this morning.  Frequent HA still.  Out of Tramadol  Objective: No acute distress. Throat clear. Neck supple without nodes or thyromegaly. Chest clear process. Heart regular without murmurs gallops or arrhythmias. Chest wall nontender. And soft without masses tenderness. Review of the old x-ray there was a little area of right lung medial adjacent to the lower border of the heart and then a little bothersome to me. Decided to repeat CXR.  [facility] reading (PRIMARY) by  Dr. Alwyn Ren No change from before..  Assessment: GERD Chronic headaches  Plan: Resume PPI RF tramadol

## 2012-03-18 NOTE — Patient Instructions (Signed)
Take 2 Tylenol every 6-8 hour if needed for mild pain Take one tramadol twice daily only when needed for severe pain of the head.  Resumed taking the protonix (pansoprasole)  one daily for the acid reflux.

## 2012-03-25 DIAGNOSIS — H25019 Cortical age-related cataract, unspecified eye: Secondary | ICD-10-CM | POA: Diagnosis not present

## 2012-03-25 DIAGNOSIS — H2510 Age-related nuclear cataract, unspecified eye: Secondary | ICD-10-CM | POA: Diagnosis not present

## 2012-03-25 DIAGNOSIS — E119 Type 2 diabetes mellitus without complications: Secondary | ICD-10-CM | POA: Diagnosis not present

## 2012-04-19 ENCOUNTER — Other Ambulatory Visit: Payer: Self-pay | Admitting: Family Medicine

## 2012-05-14 DIAGNOSIS — R51 Headache: Secondary | ICD-10-CM | POA: Diagnosis not present

## 2012-06-02 ENCOUNTER — Other Ambulatory Visit: Payer: Self-pay | Admitting: Physician Assistant

## 2012-06-13 ENCOUNTER — Other Ambulatory Visit: Payer: Self-pay | Admitting: Family Medicine

## 2012-07-02 ENCOUNTER — Other Ambulatory Visit: Payer: Self-pay | Admitting: Physician Assistant

## 2012-07-18 ENCOUNTER — Ambulatory Visit: Payer: Medicare Other

## 2012-07-18 ENCOUNTER — Emergency Department (HOSPITAL_COMMUNITY)
Admission: EM | Admit: 2012-07-18 | Discharge: 2012-07-18 | Disposition: A | Discharge status: Home / Self Care | Payer: Medicare Other | Attending: Emergency Medicine | Admitting: Emergency Medicine

## 2012-07-18 ENCOUNTER — Ambulatory Visit (INDEPENDENT_AMBULATORY_CARE_PROVIDER_SITE_OTHER): Payer: Medicare Other | Admitting: Emergency Medicine

## 2012-07-18 ENCOUNTER — Encounter (HOSPITAL_COMMUNITY): Payer: Self-pay | Admitting: *Deleted

## 2012-07-18 VITALS — BP 150/82 | HR 77 | Temp 98.3°F | Resp 16 | Ht 70.0 in | Wt 201.4 lb

## 2012-07-18 DIAGNOSIS — K219 Gastro-esophageal reflux disease without esophagitis: Secondary | ICD-10-CM

## 2012-07-18 DIAGNOSIS — Z79899 Other long term (current) drug therapy: Secondary | ICD-10-CM | POA: Insufficient documentation

## 2012-07-18 DIAGNOSIS — I10 Essential (primary) hypertension: Secondary | ICD-10-CM | POA: Diagnosis not present

## 2012-07-18 DIAGNOSIS — Z8739 Personal history of other diseases of the musculoskeletal system and connective tissue: Secondary | ICD-10-CM | POA: Insufficient documentation

## 2012-07-18 DIAGNOSIS — R079 Chest pain, unspecified: Secondary | ICD-10-CM | POA: Diagnosis not present

## 2012-07-18 DIAGNOSIS — R072 Precordial pain: Secondary | ICD-10-CM | POA: Diagnosis not present

## 2012-07-18 DIAGNOSIS — Z7982 Long term (current) use of aspirin: Secondary | ICD-10-CM | POA: Diagnosis not present

## 2012-07-18 DIAGNOSIS — F17200 Nicotine dependence, unspecified, uncomplicated: Secondary | ICD-10-CM | POA: Diagnosis not present

## 2012-07-18 DIAGNOSIS — Z85048 Personal history of other malignant neoplasm of rectum, rectosigmoid junction, and anus: Secondary | ICD-10-CM | POA: Diagnosis not present

## 2012-07-18 LAB — TROPONIN I: Troponin I: <0.3 ng/mL (ref <0.30)

## 2012-07-18 MED ORDER — IBUPROFEN 200 MG PO TABS
400.0000 mg | ORAL_TABLET | Freq: Once | ORAL | Status: AC
  Administered 2012-07-18: 400 mg via ORAL
  Filled 2012-07-18: qty 2

## 2012-07-18 MED ORDER — TRAMADOL HCL 50 MG PO TABS: 50.0000 mg | ORAL_TABLET | Freq: Four times a day (QID) | ORAL | Status: DC | PRN

## 2012-07-18 MED ORDER — HYDROCODONE-ACETAMINOPHEN 5-325 MG PO TABS
1.0000 | ORAL_TABLET | Freq: Once | ORAL | Status: AC
  Administered 2012-07-18: 1 via ORAL
  Filled 2012-07-18: qty 1

## 2012-07-18 NOTE — Progress Notes (Signed)
  Subjective:    Patient ID: Andre [name], male    DOB: 10/23/1940, 72 y.o.   MRN: [id number]  HPI Pt presents to clinic today with chest pains- he describes it as "moving". He gets a dull pain when watching TV and when walking across the room. He has noted some pain going up into his neck. He describes his pain mostly on the right side of his chest. He states that he thinks he is beginning to become SOB. Pt is a smoker- about 1/2 pack a day. He states he's been having chest pain for the last 3 days, about 8 times each day. Each episode lasts about 10-15 minutes. Pt states right now he is having pain at about a 6 on a scale of 1-10. Patient did a catheterization one year ago and the findings were normal he also does have a history of reflux disease.   Review of Systems     Objective:   Physical Exam patient is alert and cooperative does not appear in any distress. His neck is supple. His chest is clear. Heart regular rate without murmurs. Abdomen soft nontender. Extremities without edema  EKG normal sinus rhythm T wave inversion V1 to the 2 flat T. in lead 3 no change from previous [facility] reading (PRIMARY) by  Dr.Naeem Quillin there are chronic changes present bilaterally questionable increase in the upper mediastinal shadow in the arch of the aorta please      Assessment & Plan:  Patient here with atypical chest pain but significant risk for coronary disease. He did have a catheterization one year ago by Dr. Excell Seltzer. He also has a history of gastro-esophageal reflux disease. We'll go ahead and check a chest x-ray. His vital signs are currently normal. I do feel like he should probably have enzymes done to be absolutely sure that the pain is not cardiac. Need to get the opinion of the radiologist to see if they CT scan is indicated .

## 2012-07-18 NOTE — ED Notes (Addendum)
Per EMS- pt was seen at [facility] today for chest pain that started 3 days ago. Pain is intermittent and is in rt chest and occasionally moves across chest. Pt received 324mg  of asprin. Pt had clean heart catherization 1 year ago. Pt also reports headache, nasal congestion and cough with white sputum.

## 2012-07-18 NOTE — ED Provider Notes (Signed)
History     CSN: [id number]  Arrival date & time 07/18/12  0910   First MD Initiated Contact with Patient 07/18/12 [phone]      Chief Complaint  Patient presents with  . Chest Pain    (Consider location/radiation/quality/duration/timing/severity/associated sxs/prior treatment) Patient is a 72 y.o. male presenting with chest pain. The history is provided by the patient.  Chest Pain Pertinent negatives for primary symptoms include no fever, no shortness of breath, no palpitations and no abdominal pain.   pt c/o mid chest pain for past 2-3 days. Constant. Dull. Non radiating. No specific exacerbating or alleviating factors. No relation to activity or exertion. No change whether positioned upright or supine. No pleuritic pain. No associated nv, diaphoresis or sob. Denies any unusual doe or fatigue. Occasional non productive cough. No sore throat, runny nose, body aches, or fever/chills. No leg pain or swelling. No immobility, trauma, recent surgery, or travel. No dvt or pe hx. Denies hx cad or family hx cad. Had cardiac cath 1 yr ago, but unsure of results. ?hx gerd. Denies heartburn. Denies chest wall injury or strain.     Past Medical History  Diagnosis Date  . Arthritis   . Hypertension   . Rectal cancer     Past Surgical History  Procedure Date  . Surgical excision of rectal cancer     Family History  Problem Relation Age of Onset  . Hypertension Other     History  Substance Use Topics  . Smoking status: Current Every Day Smoker -- 0.5 packs/day for 47 years    Types: Cigarettes    Last Attempt to Quit: 09/17/2009  . Smokeless tobacco: Never Used  . Alcohol Use: Yes     Comment: heavy drinker, last alcohol was yesterday       Review of Systems  Constitutional: Negative for fever.  HENT: Negative for neck pain.   Eyes: Negative for redness.  Respiratory: Negative for shortness of breath.   Cardiovascular: Positive for chest pain. Negative for palpitations and leg  swelling.  Gastrointestinal: Negative for abdominal pain.  Genitourinary: Negative for flank pain.  Musculoskeletal: Negative for back pain.  Skin: Negative for rash.  Neurological: Negative for headaches.  Hematological: Does not bruise/bleed easily.  Psychiatric/Behavioral: Negative for confusion.    Allergies  Review of patient's allergies indicates no known allergies.  Home Medications   Current Outpatient Rx  Name  Route  Sig  Dispense  Refill  . AMLODIPINE BESYLATE 5 MG PO TABS      take 1 tablet by mouth once daily   30 tablet   9   . ASPIRIN EC 81 MG PO TBEC   Oral   Take 81 mg by mouth daily.           Marland Kitchen LISINOPRIL 10 MG PO TABS      take 1 tablet by mouth every morning   30 tablet   2   . LUBIPROSTONE 24 MCG PO CAPS   Oral   Take 24 mcg by mouth 2 (two) times daily with a meal.         . METOCLOPRAMIDE HCL 10 MG PO TABS   Oral   Take 1 tablet (10 mg total) by mouth every 8 (eight) hours as needed (for nausea).   12 tablet   0   . PANTOPRAZOLE SODIUM 40 MG PO TBEC      Take one daily for reflux   30 tablet   3   .  SYSTANE BALANCE OP   Both Eyes   Place 1 drop into both eyes 2 (two) times daily.          . SUCRALFATE 1 G PO TABS   Oral   Take 1 g by mouth 4 (four) times daily.         . TOPIRAMATE 100 MG PO TABS      Take at bedtime   100 tablet   2   . TRAMADOL HCL 50 MG PO TABS   Oral   Take 1 tablet (50 mg total) by mouth every 8 (eight) hours as needed. For pain. Maximum dose= 8 tablets per day   60 tablet   0   . TRAMADOL HCL 50 MG PO TABS      TAKE AS DIRECTED. DO NOT EXCEED 2 TABLETS IN 24 HOURS   30 tablet   0   . TRAMADOL HCL 50 MG PO TABS      TAKE AS DIRECTED. DO NOT EXCEED 2 TABLETS IN 24 HOURS   60 tablet   0     BP 131/70  Pulse 60  Temp 97.5 F (36.4 C) (Oral)  Resp 14  SpO2 100%  Physical Exam  Nursing note and vitals reviewed. Constitutional: He is oriented to person, place, and time. He  appears well-developed and well-nourished. No distress.  HENT:  Head: Atraumatic.  Nose: Nose normal.  Mouth/Throat: Oropharynx is clear and moist.  Eyes: Conjunctivae normal are normal. No scleral icterus.  Neck: Neck supple. No JVD present. No tracheal deviation present.  Cardiovascular: Normal rate, regular rhythm, normal heart sounds and intact distal pulses.  Exam reveals no gallop and no friction rub.   No murmur heard. Pulmonary/Chest: Effort normal and breath sounds normal. No accessory muscle usage. No respiratory distress. He exhibits tenderness.  Abdominal: Soft. He exhibits no distension. There is no tenderness.  Musculoskeletal: Normal range of motion. He exhibits no edema and no tenderness.  Neurological: He is alert and oriented to person, place, and time.  Skin: Skin is warm and dry.  Psychiatric: He has a normal mood and affect.    ED Course  Procedures (including critical care time)  Results for orders placed during the hospital encounter of 07/18/12  TROPONIN I      Component Value Range   Troponin I <0.30  <0.30 ng/mL   Dg Chest 2 View  07/18/2012  *RADIOLOGY REPORT*  Clinical Data: Chest pain.  CHEST - 2 VIEW  Comparison: 03/18/2012  Findings: Borderline cardiomegaly with tortuous thoracic aorta. The increased prominence at the level of the aortic arch is probably due to slight patient rotation to the right on the current radiograph as compared to the prior exam. This accentuates the tortuous thoracic aorta as well as the right-sided osteophytes in the thoracic spine.  Pulmonary vascularity is normal and the lungs are clear.  No acute osseous abnormality.  IMPRESSION: No acute disease.  Tortuous thoracic aorta.  Is the patient hypertensive?   Original Report Authenticated By: Francene Boyers, M.D.       MDM  Motrin po. vicodin 1 po.  [name] mid cp for the past 2-3 days, constant.  Reviewed nursing notes and prior charts for additional history.   Reviewed cath  from 1 yr ago, states no significicant cad, widely patent coronaries.  Cardiology notes indicate pt w recurrent non cardiac chest pain.   Date: 07/18/2012  Rate: 57  Rhythm: normal sinus rhythm  QRS Axis: normal  Intervals:  normal  ST/T Wave abnormalities: normal  Conduction Disutrbances:none  Narrative Interpretation:   Old EKG Reviewed: unchanged  Recheck pt comfortable.  Prior neg card cath in past year, after 2-3 days constant symptoms trop neg, pt currently symptom free post meds, appears stable for d/c. Pt states out of pai med.          Suzi Roots, MD 07/18/12 1045

## 2012-08-12 DIAGNOSIS — R5381 Other malaise: Secondary | ICD-10-CM | POA: Diagnosis not present

## 2012-08-12 DIAGNOSIS — R51 Headache: Secondary | ICD-10-CM | POA: Diagnosis not present

## 2012-08-12 DIAGNOSIS — I10 Essential (primary) hypertension: Secondary | ICD-10-CM | POA: Diagnosis not present

## 2012-08-31 ENCOUNTER — Other Ambulatory Visit: Payer: Self-pay | Admitting: Physician Assistant

## 2012-09-13 ENCOUNTER — Other Ambulatory Visit: Payer: Self-pay | Admitting: Physician Assistant

## 2012-09-27 ENCOUNTER — Other Ambulatory Visit: Payer: Self-pay | Admitting: Family Medicine

## 2012-11-01 ENCOUNTER — Other Ambulatory Visit: Payer: Self-pay | Admitting: Physician Assistant

## 2012-12-03 DIAGNOSIS — Z961 Presence of intraocular lens: Secondary | ICD-10-CM | POA: Diagnosis not present

## 2012-12-03 DIAGNOSIS — E119 Type 2 diabetes mellitus without complications: Secondary | ICD-10-CM | POA: Diagnosis not present

## 2012-12-03 DIAGNOSIS — H2510 Age-related nuclear cataract, unspecified eye: Secondary | ICD-10-CM | POA: Diagnosis not present

## 2012-12-06 ENCOUNTER — Other Ambulatory Visit: Payer: Self-pay | Admitting: Cardiovascular Disease

## 2012-12-12 ENCOUNTER — Ambulatory Visit: Payer: Medicare Other | Admitting: Nurse Practitioner

## 2012-12-15 ENCOUNTER — Other Ambulatory Visit: Payer: Self-pay | Admitting: Family Medicine

## 2012-12-16 DIAGNOSIS — Z85038 Personal history of other malignant neoplasm of large intestine: Secondary | ICD-10-CM | POA: Diagnosis not present

## 2012-12-16 DIAGNOSIS — I10 Essential (primary) hypertension: Secondary | ICD-10-CM | POA: Diagnosis not present

## 2012-12-16 DIAGNOSIS — Z86010 Personal history of colonic polyps: Secondary | ICD-10-CM | POA: Diagnosis not present

## 2013-01-02 DIAGNOSIS — Z1211 Encounter for screening for malignant neoplasm of colon: Secondary | ICD-10-CM | POA: Diagnosis not present

## 2013-01-02 DIAGNOSIS — K5730 Diverticulosis of large intestine without perforation or abscess without bleeding: Secondary | ICD-10-CM | POA: Diagnosis not present

## 2013-01-02 DIAGNOSIS — Z85038 Personal history of other malignant neoplasm of large intestine: Secondary | ICD-10-CM | POA: Diagnosis not present

## 2013-01-02 DIAGNOSIS — D126 Benign neoplasm of colon, unspecified: Secondary | ICD-10-CM | POA: Diagnosis not present

## 2013-01-02 DIAGNOSIS — K649 Unspecified hemorrhoids: Secondary | ICD-10-CM | POA: Diagnosis not present

## 2013-01-06 ENCOUNTER — Ambulatory Visit (INDEPENDENT_AMBULATORY_CARE_PROVIDER_SITE_OTHER): Payer: Medicare Other | Admitting: Family Medicine

## 2013-01-06 ENCOUNTER — Encounter: Payer: Self-pay | Admitting: Family Medicine

## 2013-01-06 VITALS — BP 132/80 | HR 66 | Temp 97.5°F | Resp 16 | Ht 70.0 in | Wt 197.0 lb

## 2013-01-06 DIAGNOSIS — R079 Chest pain, unspecified: Secondary | ICD-10-CM

## 2013-01-06 DIAGNOSIS — D7589 Other specified diseases of blood and blood-forming organs: Secondary | ICD-10-CM | POA: Diagnosis not present

## 2013-01-06 DIAGNOSIS — I6350 Cerebral infarction due to unspecified occlusion or stenosis of unspecified cerebral artery: Secondary | ICD-10-CM | POA: Insufficient documentation

## 2013-01-06 DIAGNOSIS — I10 Essential (primary) hypertension: Secondary | ICD-10-CM | POA: Diagnosis not present

## 2013-01-06 DIAGNOSIS — Z79899 Other long term (current) drug therapy: Secondary | ICD-10-CM

## 2013-01-06 DIAGNOSIS — M62838 Other muscle spasm: Secondary | ICD-10-CM

## 2013-01-06 DIAGNOSIS — R6889 Other general symptoms and signs: Secondary | ICD-10-CM

## 2013-01-06 DIAGNOSIS — R51 Headache: Secondary | ICD-10-CM | POA: Diagnosis not present

## 2013-01-06 DIAGNOSIS — K219 Gastro-esophageal reflux disease without esophagitis: Secondary | ICD-10-CM | POA: Diagnosis not present

## 2013-01-06 DIAGNOSIS — S43429A Sprain of unspecified rotator cuff capsule, initial encounter: Secondary | ICD-10-CM

## 2013-01-06 DIAGNOSIS — S43422A Sprain of left rotator cuff capsule, initial encounter: Secondary | ICD-10-CM

## 2013-01-06 DIAGNOSIS — R109 Unspecified abdominal pain: Secondary | ICD-10-CM

## 2013-01-06 DIAGNOSIS — R4182 Altered mental status, unspecified: Secondary | ICD-10-CM

## 2013-01-06 DIAGNOSIS — Z8673 Personal history of transient ischemic attack (TIA), and cerebral infarction without residual deficits: Secondary | ICD-10-CM

## 2013-01-06 LAB — COMPREHENSIVE METABOLIC PANEL
AST: 25 U/L (ref 0–37)
Alkaline Phosphatase: 111 U/L (ref 39–117)
BUN: 7 mg/dL (ref 6–23)
Calcium: 9.5 mg/dL (ref 8.4–10.5)
Creat: 0.76 mg/dL (ref 0.50–1.35)
Total Bilirubin: 0.9 mg/dL (ref 0.3–1.2)

## 2013-01-06 LAB — LIPID PANEL
Cholesterol: 157 mg/dL (ref 0–200)
Total CHOL/HDL Ratio: 3.5 Ratio
Triglycerides: 117 mg/dL (ref <150)
VLDL: 23 mg/dL (ref 0–40)

## 2013-01-06 LAB — POCT CBC
Hemoglobin: 15.6 g/dL (ref 14.1–18.1)
Lymph, poc: 2.5 (ref 0.6–3.4)
MCH, POC: 31.2 pg (ref 27–31.2)
MCHC: 31.2 g/dL — AB (ref 31.8–35.4)
MID (cbc): 0.5 (ref 0–0.9)
MPV: 6.8 fL (ref 0–99.8)
POC LYMPH PERCENT: 45.5 %L (ref 10–50)
POC MID %: 8.7 %M (ref 0–12)
WBC: 5.4 10*3/uL (ref 4.6–10.2)

## 2013-01-06 MED ORDER — METHOCARBAMOL 500 MG PO TABS: 500.0000 mg | ORAL_TABLET | Freq: Four times a day (QID) | ORAL | Status: DC | PRN

## 2013-01-06 MED ORDER — MELOXICAM 7.5 MG PO TABS: 7.5000 mg | ORAL_TABLET | Freq: Two times a day (BID) | ORAL | Status: DC | PRN

## 2013-01-06 MED ORDER — TRAMADOL HCL 50 MG PO TABS: 50.0000 mg | ORAL_TABLET | Freq: Three times a day (TID) | ORAL | Status: DC | PRN

## 2013-01-06 MED ORDER — LISINOPRIL 10 MG PO TABS: ORAL_TABLET | ORAL | Status: DC

## 2013-01-06 MED ORDER — DEXLANSOPRAZOLE 60 MG PO CPDR: 60.0000 mg | DELAYED_RELEASE_CAPSULE | Freq: Every day | ORAL | Status: DC

## 2013-01-06 MED ORDER — AMLODIPINE BESYLATE 5 MG PO TABS: 5.0000 mg | ORAL_TABLET | Freq: Every day | ORAL | Status: DC

## 2013-01-06 NOTE — Progress Notes (Signed)
Subjective:    Patient ID: Andre [name], male    DOB: 1941-03-31, 72 y.o.   MRN: [id number] Chief Complaint  Patient presents with  . Headache    * 1 month  . Heartburn    burning across chest * 1 month  . dry mouth    * 2 months  . Medication Refill    Norvasc, Lisinopril, Protonix    HPI Having central chest pains he attributes to to acid reflux intermittently.  Left shoulder is sore and radiates into left neck - started last mo and constant since then. Also having chronic headaches on top of head which have been intermittent since his CVA. Has been taking asa and drinking vinegar for these with minimal relief.  Teeth also are grinding at times - not painful but very annoying sound. Has not had a dental visit in 25 yrs. Did take his medications this morning but is otherwise fasting today. Has not made a f/u appt with his cardiologist Dr. Excell Seltzer as instructed. Smoking 4 cigarettes/day - has cut down from 1 ppd.  Did see Dr. Elnoria Howard last week for a colonoscopy - he told the nurse about his uncontrolled heartburn but has not addressed this with the physician. Pt reports that he has never had an upper endoscopy.  Saw neurology Dr. Levert Feinstein last yr - he has him on propranolol 40mg  bid.  Past Medical History  Diagnosis Date  . Arthritis   . Hypertension   . Rectal cancer    Current Outpatient Prescriptions on File Prior to Visit  Medication Sig Dispense Refill  . aspirin EC 81 MG tablet Take 81 mg by mouth every 4 (four) hours as needed for pain.       . [DISCONTINUED] chlorthalidone (HYGROTON) 25 MG tablet Take 25 mg by mouth daily.       No current facility-administered medications on file prior to visit.   No Known Allergies   Review of Systems  Constitutional: Negative for fever, chills, activity change, appetite change and fatigue.  HENT: Positive for neck pain, neck stiffness and dental problem. Negative for ear pain, congestion and sore throat.   Eyes: Negative for visual  disturbance.  Respiratory: Negative for cough and shortness of breath.   Cardiovascular: Positive for chest pain. Negative for leg swelling.  Gastrointestinal: Positive for abdominal pain. Negative for nausea, vomiting, diarrhea and constipation.  Endocrine: Positive for polydipsia.  Genitourinary: Negative for dysuria.  Musculoskeletal: Positive for myalgias and arthralgias. Negative for gait problem.  Skin: Negative for rash.  Neurological: Positive for headaches. Negative for dizziness, weakness and numbness.  Psychiatric/Behavioral: Negative for sleep disturbance.      BP 132/80  Pulse 66  Temp(Src) 97.5 F (36.4 C) (Oral)  Resp 16  Ht 5\' 10"  (1.778 m)  Wt 197 lb (89.359 kg)  BMI 28.27 kg/m2  SpO2 97%  Objective:   Physical Exam  Constitutional: He is oriented to person, place, and time. He appears well-developed and well-nourished. No distress.  HENT:  Head: Normocephalic and atraumatic.  Eyes: Conjunctivae are normal. Pupils are equal, round, and reactive to light. No scleral icterus.  Neck: Neck supple. Muscular tenderness present. No spinous process tenderness present. No rigidity. Decreased range of motion present. No thyromegaly present.  Cardiovascular: Normal rate, regular rhythm, normal heart sounds and intact distal pulses.   Pulmonary/Chest: Effort normal and breath sounds normal. No respiratory distress.  Musculoskeletal: He exhibits no edema.       Right shoulder: He exhibits normal  range of motion, no tenderness, no bony tenderness and no spasm.       Left shoulder: He exhibits decreased range of motion, spasm and decreased strength. He exhibits no tenderness and no bony tenderness.       Cervical back: He exhibits decreased range of motion, tenderness and spasm. He exhibits no bony tenderness.  No tenderness over spinous processes Decreased strength in Left rotator cuff on empty can test and int/ext rotation.  Decreased abduction and decreased external rotation  on exam.  Lymphadenopathy:    He has no cervical adenopathy.  Neurological: He is alert and oriented to person, place, and time.  Reflex Scores:      Bicep reflexes are 2+ on the right side and 2+ on the left side.      Brachioradialis reflexes are 2+ on the right side and 2+ on the left side. Skin: Skin is warm and dry. He is not diaphoretic.  Psychiatric: He has a normal mood and affect. His behavior is normal.      Results for orders placed in visit on 01/06/13  GLUCOSE, POCT (MANUAL RESULT ENTRY)      Result Value Range   POC Glucose 100 (*) 70 - 99 mg/dl  POCT CBC      Result Value Range   WBC 5.4  4.6 - 10.2 K/uL   Lymph, poc 2.5  0.6 - 3.4   POC LYMPH PERCENT 45.5  10 - 50 %L   MID (cbc) 0.5  0 - 0.9   POC MID % 8.7  0 - 12 %M   POC Granulocyte 2.5  2 - 6.9   Granulocyte percent 45.8  37 - 80 %G   RBC 5.00  4.69 - 6.13 M/uL   Hemoglobin 15.6  14.1 - 18.1 g/dL   HCT, POC 40.9  81.1 - 53.7 %   MCV 100.1 (*) 80 - 97 fL   MCH, POC 31.2  27 - 31.2 pg   MCHC 31.2 (*) 31.8 - 35.4 g/dL   RDW, POC 91.4     Platelet Count, POC 219  142 - 424 K/uL   MPV 6.8  0 - 99.8 fL    Assessment & Plan:  GERDRefer back to Dr. Elnoria Howard for upper endoscopy - gerd persistent after high dose ppi. Has failed prevacid, protonix, and prilosec.  Try dexilant. Try trx for muscle spasm - if persists in 3 wks, f/u for imaging and PT referral, check glucose to consider course of steriods CP - h/o neg cardiac w/u but due to f/u w/ Dr. Excell Seltzer - make appt H/o CVA - cont on asa 81 (started after small CVA found). Goal LDL <100.\ HTN  Headache - Plan: TSH, POCT glucose (manual entry), POCT CBC  GERD (gastroesophageal reflux disease) - Plan: Ambulatory referral to Gastroenterology  Essential hypertension, benign - Plan: Lipid panel  Abdominal pain, recurrent  Left pontine CVA - Plan: Lipid panel  History of CVA (cerebrovascular accident) without residual deficits  Muscle spasms of neck  Rotator  cuff (capsule) sprain, left, initial encounter  Encounter for long-term (current) use of other medications - Plan: Comprehensive metabolic panel  Other general [id number](308.65)  - Plan: TSH, Vitamin B12, Folate  Chest pain  - Plan: POCT glucose (manual entry)  Macrocytosis without anemia - Plan: Vitamin B12, Folate  Meds ordered this encounter  Medications  . propranolol (INDERAL) 40 MG tablet    Sig: Take 40 mg by mouth 2 (two) times daily.  Marland Kitchen DISCONTD: traMADol (  ULTRAM) 50 MG tablet    Sig: Take 1 tablet (50 mg total) by mouth every 8 (eight) hours as needed for pain.    Dispense:  60 tablet    Refill:  2  . DISCONTD: lisinopril (PRINIVIL,ZESTRIL) 10 MG tablet    Sig: take 1 tablet by mouth every morning    Dispense:  30 tablet    Refill:  3  . DISCONTD: amLODipine (NORVASC) 5 MG tablet    Sig: Take 1 tablet (5 mg total) by mouth daily.    Dispense:  30 tablet    Refill:  3    Patient needs appointment for future refills please call our office at [phone] to schedule an appointment  . DISCONTD: dexlansoprazole (DEXILANT) 60 MG capsule    Sig: Take 1 capsule (60 mg total) by mouth daily.    Dispense:  30 capsule    Refill:  3  . DISCONTD: methocarbamol (ROBAXIN) 500 MG tablet    Sig: Take 1 tablet (500 mg total) by mouth 4 (four) times daily as needed (muscle spasm).    Dispense:  30 tablet    Refill:  1  . DISCONTD: meloxicam (MOBIC) 7.5 MG tablet    Sig: Take 1 tablet (7.5 mg total) by mouth 2 (two) times daily as needed for pain.    Dispense:  30 tablet    Refill:  1

## 2013-01-06 NOTE — Patient Instructions (Addendum)
You need an appointment with Dr. [name] for further evaluation of your heart. Please call their office at [phone] to schedule an appointment. You need to follow-up with Dr. 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 Try the meloxicam every day and the methcarbamol every night before bed.  If you need to take additional of these medications you may. Do not take any other over-the-counter pain medicine other than your enteric-coated aspirin 81mg  and generic tylenol (acetaminophen). If you continue to have shouler/neck pain, please return to clinic for further evaluation to consider whether you need imaging or to see a specialist like a physical therapist.  In the meantime, do 15 minutes of heat three times a day to your neck and your shoulder as well as gentle stretching.  Impingement Syndrome, Rotator Cuff, Bursitis with Rehab Impingement syndrome is a condition that involves inflammation of the tendons of the rotator cuff and the subacromial bursa, that causes pain in the shoulder. The rotator cuff consists of four tendons and muscles that control much of the shoulder and upper arm function. The subacromial bursa is a fluid filled sac that helps reduce friction between the rotator cuff and one of the bones of the shoulder (acromion). Impingement syndrome is usually an overuse injury that causes swelling of the bursa (bursitis), swelling of the tendon (tendonitis), and/or a tear of the tendon (strain). Strains are classified into three categories. Grade 1 strains cause pain, but the tendon is not lengthened. Grade 2 strains include a lengthened ligament, due to the ligament being stretched or partially ruptured. With grade 2 strains there is still function, although the function may be decreased. Grade 3 strains include a complete tear of the tendon or muscle, and function is usually impaired. SYMPTOMS   Pain around the shoulder, often  at the outer portion of the upper arm.  Pain that gets worse with shoulder function, especially when reaching overhead or lifting.  Sometimes, aching when not using the arm.  Pain that wakes you up at night.  Sometimes, tenderness, swelling, warmth, or redness over the affected area.  Loss of strength.  Limited motion of the shoulder, especially reaching behind the back (to the back pocket or to unhook bra) or across your body.  Crackling sound (crepitation) when moving the arm.  Biceps tendon pain and inflammation (in the front of the shoulder). Worse when bending the elbow or lifting. CAUSES  Impingement syndrome is often an overuse injury, in which chronic (repetitive) motions cause the tendons or bursa to become inflamed. A strain occurs when a force is paced on the tendon or muscle that is greater than it can withstand. Common mechanisms of injury include: Stress from sudden increase in duration, frequency, or intensity of training.  Direct hit (trauma) to the shoulder.  Aging, erosion of the tendon with normal use.  Bony bump on shoulder (acromial spur). RISK INCREASES WITH:  Contact sports (football, wrestling, boxing).  Throwing sports (baseball, tennis, volleyball).  Weightlifting and bodybuilding.  Heavy labor.  Previous injury to the rotator cuff, including impingement.  Poor shoulder strength and flexibility.  Failure to warm up properly before activity.  Inadequate protective equipment.  Old age.  Bony bump on shoulder (acromial spur). PREVENTION   Warm up and stretch properly before activity.  Allow for adequate recovery between workouts.  Maintain physical fitness:  Strength, flexibility, and endurance.  Cardiovascular fitness.  Learn and use proper exercise technique. PROGNOSIS  If treated properly, impingement syndrome usually goes away within 6 weeks. Sometimes surgery is required.  RELATED COMPLICATIONS   Longer healing time if not  properly treated, or if not given enough time to heal.  Recurring symptoms, that result in a chronic condition.  Shoulder stiffness, frozen shoulder, or loss of motion.  Rotator cuff tendon tear.  Recurring symptoms, especially if activity is resumed too soon, with overuse, with a direct blow, or when using poor technique. TREATMENT  Treatment first involves the use of ice and medicine, to reduce pain and inflammation. The use of strengthening and stretching exercises may help reduce pain with activity. These exercises may be performed at home or with a therapist. If non-surgical treatment is unsuccessful after more than 6 months, surgery may be advised. After surgery and rehabilitation, activity is usually possible in 3 months.  MEDICATION  If pain medicine is needed, nonsteroidal anti-inflammatory medicines (aspirin and ibuprofen), or other minor pain relievers (acetaminophen), are often advised.  Do not take pain medicine for 7 days before surgery.  Prescription pain relievers may be given, if your caregiver thinks they are needed. Use only as directed and only as much as you need.  Corticosteroid injections may be given by your caregiver. These injections should be reserved for the most serious cases, because they may only be given a certain number of times. HEAT AND COLD  Cold treatment (icing) should be applied for 10 to 15 minutes every 2 to 3 hours for inflammation and pain, and immediately after activity that aggravates your symptoms. Use ice packs or an ice massage.  Heat treatment may be used before performing stretching and strengthening activities prescribed by your caregiver, physical therapist, or athletic trainer. Use a heat pack or a warm water soak. SEEK MEDICAL CARE IF:   Symptoms get worse or do not improve in 4 to 6 weeks, despite treatment.  New, unexplained symptoms develop. (Drugs used in treatment may produce side effects.) EXERCISES  RANGE OF MOTION (ROM) AND  STRETCHING EXERCISES - Impingement Syndrome (Rotator Cuff  Tendinitis, Bursitis) These exercises may help you when beginning to rehabilitate your injury. Your symptoms may go away with or without further involvement from your physician, physical therapist or athletic trainer. While completing these exercises, remember:   Restoring tissue flexibility helps normal motion to return to the joints. This allows healthier, less painful movement and activity.  An effective stretch should be held for at least 30 seconds.  A stretch should never be painful. You should only feel a gentle lengthening or release in the stretched tissue. STRETCH  Flexion, Standing  Stand with good posture. With an underhand grip on your right / left hand, and an overhand grip on the opposite hand, grasp a broomstick or cane so that your hands are a little more than shoulder width apart.  Keeping your right / left elbow straight and shoulder muscles relaxed, push the stick with your opposite hand, to raise your right / left arm in front of your body and then overhead. Raise your arm until you feel a stretch in your right / left shoulder, but before you have increased shoulder pain.  Try to avoid shrugging your right / left shoulder as your arm rises, by keeping your shoulder blade tucked down and toward your mid-back spine. Hold for __________ seconds.  Slowly return to the starting position. Repeat __________ times. Complete this exercise __________ times per day. STRETCH  Abduction, Supine  Lie on your back. With an underhand grip  on your right / left hand and an overhand grip on the opposite hand, grasp a broomstick or cane so that your hands are a little more than shoulder width apart.  Keeping your right / left elbow straight and your shoulder muscles relaxed, push the stick with your opposite hand, to raise your right / left arm out to the side of your body and then overhead. Raise your arm until you feel a stretch in  your right / left shoulder, but before you have increased shoulder pain.  Try to avoid shrugging your right / left shoulder as your arm rises, by keeping your shoulder blade tucked down and toward your mid-back spine. Hold for __________ seconds.  Slowly return to the starting position. Repeat __________ times. Complete this exercise __________ times per day. ROM  Flexion, Active-Assisted  Lie on your back. You may bend your knees for comfort.  Grasp a broomstick or cane so your hands are about shoulder width apart. Your right / left hand should grip the end of the stick, so that your hand is positioned "thumbs-up," as if you were about to shake hands.  Using your healthy arm to lead, raise your right / left arm overhead, until you feel a gentle stretch in your shoulder. Hold for __________ seconds.  Use the stick to assist in returning your right / left arm to its starting position. Repeat __________ times. Complete this exercise __________ times per day.  ROM - Internal Rotation, Supine   Lie on your back on a firm surface. Place your right / left elbow about 60 degrees away from your side. Elevate your elbow with a folded towel, so that the elbow and shoulder are the same height.  Using a broomstick or cane and your strong arm, pull your right / left hand toward your body until you feel a gentle stretch, but no increase in your shoulder pain. Keep your shoulder and elbow in place throughout the exercise.  Hold for __________ seconds. Slowly return to the starting position. Repeat __________ times. Complete this exercise __________ times per day. STRETCH - Internal Rotation  Place your right / left hand behind your back, palm up.  Throw a towel or belt over your opposite shoulder. Grasp the towel with your right / left hand.  While keeping an upright posture, gently pull up on the towel, until you feel a stretch in the front of your right / left shoulder.  Avoid shrugging your right  / left shoulder as your arm rises, by keeping your shoulder blade tucked down and toward your mid-back spine.  Hold for __________ seconds. Release the stretch, by lowering your healthy hand. Repeat __________ times. Complete this exercise __________ times per day. ROM - Internal Rotation   Using an underhand grip, grasp a stick behind your back with both hands.  While standing upright with good posture, slide the stick up your back until you feel a mild stretch in the front of your shoulder.  Hold for __________ seconds. Slowly return to your starting position. Repeat __________ times. Complete this exercise __________ times per day.  STRETCH  Posterior Shoulder Capsule   Stand or sit with good posture. Grasp your right / left elbow and draw it across your chest, keeping it at the same height as your shoulder.  Pull your elbow, so your upper arm comes in closer to your chest. Pull until you feel a gentle stretch in the back of your shoulder.  Hold for __________ seconds. Repeat __________  times. Complete this exercise __________ times per day. STRENGTHENING EXERCISES - Impingement Syndrome (Rotator Cuff Tendinitis, Bursitis) These exercises may help you when beginning to rehabilitate your injury. They may resolve your symptoms with or without further involvement from your physician, physical therapist or athletic trainer. While completing these exercises, remember:  Muscles can gain both the endurance and the strength needed for everyday activities through controlled exercises.  Complete these exercises as instructed by your physician, physical therapist or athletic trainer. Increase the resistance and repetitions only as guided.  You may experience muscle soreness or fatigue, but the pain or discomfort you are trying to eliminate should never worsen during these exercises. If this pain does get worse, stop and make sure you are following the directions exactly. If the pain is still  present after adjustments, discontinue the exercise until you can discuss the trouble with your clinician.  During your recovery, avoid activity or exercises which involve actions that place your injured hand or elbow above your head or behind your back or head. These positions stress the tissues which you are trying to heal. STRENGTH - Scapular Depression and Adduction   With good posture, sit on a firm chair. Support your arms in front of you, with pillows, arm rests, or on a table top. Have your elbows in line with the sides of your body.  Gently draw your shoulder blades down and toward your mid-back spine. Gradually increase the tension, without tensing the muscles along the top of your shoulders and the back of your neck.  Hold for __________ seconds. Slowly release the tension and relax your muscles completely before starting the next repetition.  After you have practiced this exercise, remove the arm support and complete the exercise in standing as well as sitting position. Repeat __________ times. Complete this exercise __________ times per day.  STRENGTH - Shoulder Abductors, Isometric  With good posture, stand or sit about 4-6 inches from a wall, with your right / left side facing the wall.  Bend your right / left elbow. Gently press your right / left elbow into the wall. Increase the pressure gradually, until you are pressing as hard as you can, without shrugging your shoulder or increasing any shoulder discomfort.  Hold for __________ seconds.  Release the tension slowly. Relax your shoulder muscles completely before you begin the next repetition. Repeat __________ times. Complete this exercise __________ times per day.  STRENGTH - External Rotators, Isometric  Keep your right / left elbow at your side and bend it 90 degrees.  Step into a door frame so that the outside of your right / left wrist can press against the door frame without your upper arm leaving your  side.  Gently press your right / left wrist into the door frame, as if you were trying to swing the back of your hand away from your stomach. Gradually increase the tension, until you are pressing as hard as you can, without shrugging your shoulder or increasing any shoulder discomfort.  Hold for __________ seconds.  Release the tension slowly. Relax your shoulder muscles completely before you begin the next repetition. Repeat __________ times. Complete this exercise __________ times per day.  STRENGTH - Supraspinatus   Stand or sit with good posture. Grasp a __________ weight, or an exercise band or tubing, so that your hand is "thumbs-up," like you are shaking hands.  Slowly lift your right / left arm in a "V" away from your thigh, diagonally into the space between your side  and straight ahead. Lift your hand to shoulder height or as far as you can, without increasing any shoulder pain. At first, many people do not lift their hands above shoulder height.  Avoid shrugging your right / left shoulder as your arm rises, by keeping your shoulder blade tucked down and toward your mid-back spine.  Hold for __________ seconds. Control the descent of your hand, as you slowly return to your starting position. Repeat __________ times. Complete this exercise __________ times per day.  STRENGTH - External Rotators  Secure a rubber exercise band or tubing to a fixed object (table, pole) so that it is at the same height as your right / left elbow when you are standing or sitting on a firm surface.  Stand or sit so that the secured exercise band is at your uninjured side.  Bend your right / left elbow 90 degrees. Place a folded towel or small pillow under your right / left arm, so that your elbow is a few inches away from your side.  Keeping the tension on the exercise band, pull it away from your body, as if pivoting on your elbow. Be sure to keep your body steady, so that the movement is coming only  from your rotating shoulder.  Hold for __________ seconds. Release the tension in a controlled manner, as you return to the starting position. Repeat __________ times. Complete this exercise __________ times per day.  STRENGTH - Internal Rotators   Secure a rubber exercise band or tubing to a fixed object (table, pole) so that it is at the same height as your right / left elbow when you are standing or sitting on a firm surface.  Stand or sit so that the secured exercise band is at your right / left side.  Bend your elbow 90 degrees. Place a folded towel or small pillow under your right / left arm so that your elbow is a few inches away from your side.  Keeping the tension on the exercise band, pull it across your body, toward your stomach. Be sure to keep your body steady, so that the movement is coming only from your rotating shoulder.  Hold for __________ seconds. Release the tension in a controlled manner, as you return to the starting position. Repeat __________ times. Complete this exercise __________ times per day.  STRENGTH - Scapular Protractors, Standing   Stand arms length away from a wall. Place your hands on the wall, keeping your elbows straight.  Begin by dropping your shoulder blades down and toward your mid-back spine.  To strengthen your protractors, keep your shoulder blades down, but slide them forward on your rib cage. It will feel as if you are lifting the back of your rib cage away from the wall. This is a subtle motion and can be challenging to complete. Ask your caregiver for further instruction, if you are not sure you are doing the exercise correctly.  Hold for __________ seconds. Slowly return to the starting position, resting the muscles completely before starting the next repetition. Repeat __________ times. Complete this exercise __________ times per day. STRENGTH - Scapular Protractors, Supine  Lie on your back on a firm surface. Extend your right / left  arm straight into the air while holding a __________ weight in your hand.  Keeping your head and back in place, lift your shoulder off the floor.  Hold for __________ seconds. Slowly return to the starting position, and allow your muscles to relax completely before starting the  next repetition. Repeat __________ times. Complete this exercise __________ times per day. STRENGTH - Scapular Protractors, Quadruped  Get onto your hands and knees, with your shoulders directly over your hands (or as close as you can be, comfortably).  Keeping your elbows locked, lift the back of your rib cage up into your shoulder blades, so your mid-back rounds out. Keep your neck muscles relaxed.  Hold this position for __________ seconds. Slowly return to the starting position and allow your muscles to relax completely before starting the next repetition. Repeat __________ times. Complete this exercise __________ times per day.  STRENGTH - Scapular Retractors  Secure a rubber exercise band or tubing to a fixed object (table, pole), so that it is at the height of your shoulders when you are either standing, or sitting on a firm armless chair.  With a palm down grip, grasp an end of the band in each hand. Straighten your elbows and lift your hands straight in front of you, at shoulder height. Step back, away from the secured end of the band, until it becomes tense.  Squeezing your shoulder blades together, draw your elbows back toward your sides, as you bend them. Keep your upper arms lifted away from your body throughout the exercise.  Hold for __________ seconds. Slowly ease the tension on the band, as you reverse the directions and return to the starting position. Repeat __________ times. Complete this exercise __________ times per day. STRENGTH - Shoulder Extensors   Secure a rubber exercise band or tubing to a fixed object (table, pole) so that it is at the height of your shoulders when you are either  standing, or sitting on a firm armless chair.  With a thumbs-up grip, grasp an end of the band in each hand. Straighten your elbows and lift your hands straight in front of you, at shoulder height. Step back, away from the secured end of the band, until it becomes tense.  Squeezing your shoulder blades together, pull your hands down to the sides of your thighs. Do not allow your hands to go behind you.  Hold for __________ seconds. Slowly ease the tension on the band, as you reverse the directions and return to the starting position. Repeat __________ times. Complete this exercise __________ times per day.  STRENGTH - Scapular Retractors and External Rotators   Secure a rubber exercise band or tubing to a fixed object (table, pole) so that it is at the height as your shoulders, when you are either standing, or sitting on a firm armless chair.  With a palm down grip, grasp an end of the band in each hand. Bend your elbows 90 degrees and lift your elbows to shoulder height, at your sides. Step back, away from the secured end of the band, until it becomes tense.  Squeezing your shoulder blades together, rotate your shoulders so that your upper arms and elbows remain stationary, but your fists travel upward to head height.  Hold for __________ seconds. Slowly ease the tension on the band, as you reverse the directions and return to the starting position. Repeat __________ times. Complete this exercise __________ times per day.  STRENGTH - Scapular Retractors and External Rotators, Rowing   Secure a rubber exercise band or tubing to a fixed object (table, pole) so that it is at the height of your shoulders, when you are either standing, or sitting on a firm armless chair.  With a palm down grip, grasp an end of the band in each hand.  Straighten your elbows and lift your hands straight in front of you, at shoulder height. Step back, away from the secured end of the band, until it becomes  tense.  Step 1: Squeeze your shoulder blades together. Bending your elbows, draw your hands to your chest, as if you are rowing a boat. At the end of this motion, your hands and elbow should be at shoulder height and your elbows should be out to your sides.  Step 2: Rotate your shoulders, to raise your hands above your head. Your forearms should be vertical and your upper arms should be horizontal.  Hold for __________ seconds. Slowly ease the tension on the band, as you reverse the directions and return to the starting position. Repeat __________ times. Complete this exercise __________ times per day.  STRENGTH  Scapular Depressors  Find a sturdy chair without wheels, such as a dining room chair.  Keeping your feet on the floor, and your hands on the chair arms, lift your bottom up from the seat, and lock your elbows.  Keeping your elbows straight, allow gravity to pull your body weight down. Your shoulders will rise toward your ears.  Raise your body against gravity by drawing your shoulder blades down your back, shortening the distance between your shoulders and ears. Although your feet should always maintain contact with the floor, your feet should progressively support less body weight, as you get stronger.  Hold for __________ seconds. In a controlled and slow manner, lower your body weight to begin the next repetition. Repeat __________ times. Complete this exercise __________ times per day.  Document Released: 05/29/2005 Document Revised: 08/21/2011 Document Reviewed: 09/10/2008 [facility] Patient Information 2014 [place], [place].

## 2013-01-15 ENCOUNTER — Encounter (HOSPITAL_COMMUNITY): Payer: Self-pay | Admitting: Emergency Medicine

## 2013-01-15 ENCOUNTER — Emergency Department (HOSPITAL_COMMUNITY): Payer: Medicare Other

## 2013-01-15 ENCOUNTER — Emergency Department (HOSPITAL_COMMUNITY)
Admission: EM | Admit: 2013-01-15 | Discharge: 2013-01-15 | Disposition: A | Discharge status: Home / Self Care | Payer: Medicare Other | Attending: Emergency Medicine | Admitting: Emergency Medicine

## 2013-01-15 DIAGNOSIS — M129 Arthropathy, unspecified: Secondary | ICD-10-CM | POA: Insufficient documentation

## 2013-01-15 DIAGNOSIS — R059 Cough, unspecified: Secondary | ICD-10-CM | POA: Insufficient documentation

## 2013-01-15 DIAGNOSIS — K219 Gastro-esophageal reflux disease without esophagitis: Secondary | ICD-10-CM | POA: Diagnosis not present

## 2013-01-15 DIAGNOSIS — Z791 Long term (current) use of non-steroidal anti-inflammatories (NSAID): Secondary | ICD-10-CM | POA: Diagnosis not present

## 2013-01-15 DIAGNOSIS — Y939 Activity, unspecified: Secondary | ICD-10-CM | POA: Insufficient documentation

## 2013-01-15 DIAGNOSIS — Z7982 Long term (current) use of aspirin: Secondary | ICD-10-CM | POA: Diagnosis not present

## 2013-01-15 DIAGNOSIS — M542 Cervicalgia: Secondary | ICD-10-CM | POA: Diagnosis not present

## 2013-01-15 DIAGNOSIS — S336XXA Sprain of sacroiliac joint, initial encounter: Secondary | ICD-10-CM | POA: Diagnosis not present

## 2013-01-15 DIAGNOSIS — R350 Frequency of micturition: Secondary | ICD-10-CM | POA: Insufficient documentation

## 2013-01-15 DIAGNOSIS — R05 Cough: Secondary | ICD-10-CM | POA: Insufficient documentation

## 2013-01-15 DIAGNOSIS — F17200 Nicotine dependence, unspecified, uncomplicated: Secondary | ICD-10-CM | POA: Insufficient documentation

## 2013-01-15 DIAGNOSIS — R109 Unspecified abdominal pain: Secondary | ICD-10-CM | POA: Insufficient documentation

## 2013-01-15 DIAGNOSIS — I10 Essential (primary) hypertension: Secondary | ICD-10-CM | POA: Insufficient documentation

## 2013-01-15 DIAGNOSIS — R51 Headache: Secondary | ICD-10-CM | POA: Insufficient documentation

## 2013-01-15 DIAGNOSIS — Y929 Unspecified place or not applicable: Secondary | ICD-10-CM | POA: Insufficient documentation

## 2013-01-15 DIAGNOSIS — IMO0002 Reserved for concepts with insufficient information to code with codable children: Secondary | ICD-10-CM | POA: Diagnosis not present

## 2013-01-15 DIAGNOSIS — J029 Acute pharyngitis, unspecified: Secondary | ICD-10-CM | POA: Insufficient documentation

## 2013-01-15 DIAGNOSIS — Z79899 Other long term (current) drug therapy: Secondary | ICD-10-CM | POA: Diagnosis not present

## 2013-01-15 DIAGNOSIS — Z85048 Personal history of other malignant neoplasm of rectum, rectosigmoid junction, and anus: Secondary | ICD-10-CM | POA: Insufficient documentation

## 2013-01-15 DIAGNOSIS — M47817 Spondylosis without myelopathy or radiculopathy, lumbosacral region: Secondary | ICD-10-CM | POA: Diagnosis not present

## 2013-01-15 DIAGNOSIS — S39012A Strain of muscle, fascia and tendon of lower back, initial encounter: Secondary | ICD-10-CM

## 2013-01-15 DIAGNOSIS — X58XXXA Exposure to other specified factors, initial encounter: Secondary | ICD-10-CM | POA: Insufficient documentation

## 2013-01-15 MED ORDER — OXYCODONE-ACETAMINOPHEN 5-325 MG PO TABS
1.0000 | ORAL_TABLET | Freq: Once | ORAL | Status: AC
  Administered 2013-01-15: 1 via ORAL
  Filled 2013-01-15: qty 1

## 2013-01-15 MED ORDER — OXYCODONE-ACETAMINOPHEN 5-325 MG PO TABS: 2.0000 | ORAL_TABLET | ORAL | Status: DC | PRN

## 2013-01-15 MED ORDER — CYCLOBENZAPRINE HCL 5 MG PO TABS: 5.0000 mg | ORAL_TABLET | Freq: Two times a day (BID) | ORAL | Status: DC | PRN

## 2013-01-15 MED ORDER — HYDROMORPHONE HCL PF 1 MG/ML IJ SOLN
0.5000 mg | Freq: Once | INTRAMUSCULAR | Status: AC
  Administered 2013-01-15: 0.5 mg via INTRAMUSCULAR
  Filled 2013-01-15: qty 1

## 2013-01-15 NOTE — ED Notes (Signed)
Pt states he has had pain in his shoulder for two weeks, and now it feels like the pain has radiated down to his back and sides. Pt rates pain 8/10. Pt states his pain gets worse when he lays on his left side and nothing has made the pain better. Pt denies any activity that could have caused the pain. States it started when he got out of bed this morning.

## 2013-01-15 NOTE — ED Provider Notes (Signed)
  Medical screening examination/treatment/procedure(s) were performed by non-physician practitioner and as supervising physician I was immediately available for consultation/collaboration.    Taffy Delconte, MD 01/15/13 1600 

## 2013-01-15 NOTE — ED Provider Notes (Signed)
CSN: [id number]     Arrival date & time 01/15/13  1012 History     First MD Initiated Contact with Patient 01/15/13 1047     Chief Complaint  Patient presents with  . Back Pain   (Consider location/radiation/quality/duration/timing/severity/associated sxs/prior Treatment) Patient is a 72 y.o. male presenting with back pain.  Back Pain Associated symptoms: abdominal pain and headaches   Associated symptoms: no chest pain, no dysuria, no fever and no weakness   Andre [name] is a 72 year old male with PMH of CVA, GERD, HTN, who presents to [facility] this morning with complaints of new onset back pain.  Patient reports that he woke up this morning in no pain but as he turned to get out of bed he felt an intense pain in his lumbar region.  He reports that the pain is not exactly constant but worse with any movement and was also very painful driving (each bump in road).  He reports that the pain is an 8-10/10, does not radiate but occasionally feels it in his abdomen too.  He reports he has never had this type of pain before and denies any history of kidney stones. Patient did recently see his PCP last week where he noted some Left shoulder soreness where he was prescribed meloxicam and methcarbamol and given shoulder exercies, he reprots this has helped his shoulder pain.  He also has a history of GERD and saw Dr. Elnoria Howard (GI) this morning for regular follow up, he does note that he had a recent colonoscopy with Dr. Elnoria Howard which he reports was normal..   Past Medical History  Diagnosis Date  . Arthritis   . Hypertension   . Rectal cancer    Past Surgical History  Procedure Laterality Date  . Surgical excision of rectal cancer     Family History  Problem Relation Age of Onset  . Hypertension Other    History  Substance Use Topics  . Smoking status: Current Every Day Smoker -- 0.50 packs/day for 47 years    Types: Cigarettes    Last Attempt to Quit: 09/17/2009  . Smokeless tobacco: Never Used  .  Alcohol Use: Yes     Comment: heavy drinker, last alcohol was yesterday     Review of Systems  Constitutional: Negative for fever, chills and fatigue.  HENT: Positive for sore throat and neck pain. Negative for hearing loss, sinus pressure and tinnitus.   Eyes: Negative for visual disturbance.  Respiratory: Positive for cough.   Cardiovascular: Negative for chest pain, palpitations and leg swelling.  Gastrointestinal: Positive for abdominal pain. Negative for nausea, vomiting, diarrhea and constipation.  Genitourinary: Positive for frequency and flank pain. Negative for dysuria, hematuria, difficulty urinating and testicular pain.  Musculoskeletal: Positive for back pain. Negative for myalgias, arthralgias and gait problem.  Skin: Negative for rash.  Neurological: Positive for headaches. Negative for dizziness and weakness.    Allergies  Review of patient's allergies indicates no known allergies.  Home Medications   Current Outpatient Rx  Name  Route  Sig  Dispense  Refill  . amLODipine (NORVASC) 5 MG tablet   Oral   Take 5 mg by mouth daily.         Marland Kitchen aspirin EC 81 MG tablet   Oral   Take 81 mg by mouth every 4 (four) hours as needed for pain.          Marland Kitchen dexlansoprazole (DEXILANT) 60 MG capsule   Oral   Take 60 mg  by mouth daily.         Marland Kitchen lisinopril (PRINIVIL,ZESTRIL) 10 MG tablet   Oral   Take 10 mg by mouth daily.         . meloxicam (MOBIC) 7.5 MG tablet   Oral   Take 7.5 mg by mouth 2 (two) times daily.         . methocarbamol (ROBAXIN) 500 MG tablet   Oral   Take 500 mg by mouth 4 (four) times daily as needed (muscle spasms).         . metoCLOPramide (REGLAN) 10 MG tablet   Oral   Take 10 mg by mouth 3 (three) times daily as needed (nausea).         . propranolol (INDERAL) 40 MG tablet   Oral   Take 40 mg by mouth 2 (two) times daily.         . traMADol (ULTRAM) 50 MG tablet   Oral   Take 50 mg by mouth every 6 (six) hours as needed for  pain.          BP 129/75  Pulse 56  Temp(Src) 97.7 F (36.5 C)  Resp 16  SpO2 100% Physical Exam  Constitutional: He is oriented to person, place, and time. He appears well-developed and well-nourished. No distress.  HENT:  Head: Normocephalic and atraumatic.  Eyes: Conjunctivae are normal.  Cardiovascular: Normal rate and regular rhythm.   No murmur heard. Pulmonary/Chest: Effort normal and breath sounds normal. No respiratory distress. He has no wheezes. He has no rales. He exhibits no tenderness.  Abdominal: Soft. Bowel sounds are normal. He exhibits no distension. There is no tenderness. There is no rebound.  Musculoskeletal: He exhibits no edema.       Lumbar back: He exhibits tenderness (Left Lumbar paraspinals > Right). He exhibits no bony tenderness.  Neurological: He is alert and oriented to person, place, and time.  Skin: Skin is warm and dry. He is not diaphoretic.    ED Course   Procedures (including critical care time)  Labs Reviewed - No data to display Dg Lumbar Spine 2-3 Views  01/15/2013   *RADIOLOGY REPORT*  Clinical Data: Low back pain, no injury, limited range of motion  LUMBAR SPINE - 2-3 VIEW  Comparison: CT abdomen pelvis of 08/08/2011  Findings: The lumbar vertebrae remain in normal alignment.  There is anterior osteophyte formation but intervertebral disc spaces appear normal.  There is degenerative change involving the facet joints diffusely.  No compression deformity is seen.  The SI joints are corticated.  The bowel gas pattern is nonspecific.  IMPRESSION: Normal alignment with normal disc spaces.  Anterior osteophytes are noted and there is degenerative change involving the facet joints of the lower lumbar spine.   Original Report Authenticated By: Dwyane Dee, M.D.   1. Back strain, initial encounter     MDM  72 year old man with PMH of GERD, HTN, CVA presents today for evaluation of lumbar back pain that started this morning.  Patient does have a  history of HTN and is a current smoker however patient has had MR abdomen in Mar 2013 without mention of Abdominal Aortic Aneurism.  Also Abd CT in Feb 2013 that shows normal abdominal aorta caliber.  Vital signs are stable.  Patient's straight leg raise was negative b/l.  Will obtain Lumbar Xrays to evaluate for fracture- no Fx, normal alignment, normal disc space.  Obtain EKG- unchanged from previous. -Treat pain with single dose of  percocet and 0.5 mg IM dilaudid.  Will discharge home with follow up with PCP. Prescribe flexeril and Percocet for Back strain.  Carlynn Purl, DO 01/15/13 1314

## 2013-01-15 NOTE — ED Provider Notes (Signed)
10:53 AM Medical Screening Exam. 72 year old man with no history of back pain reports sudden onset low back pain this morning.  Pain is exacerbating by bending and moving but also by bumps on the road when driving in a motor vehicle.  No known injury. Back is nontender, abdomen is nontender.  Pedal pulses are intact.   This patient was triaged as a 4 and placed in fast track.  Given his age and symptoms and no hx back pain, I have asked the nurse to change him to a level 3 and move to main ED.  I have spoken with Dr Jeraldine Loots about the patient and he is in agreement.    Trixie Dredge, PA-C 01/15/13 1055

## 2013-01-15 NOTE — ED Notes (Signed)
Discharge instructions reviewed with pt. Pt verbalized understanding.   

## 2013-01-15 NOTE — ED Provider Notes (Addendum)
Medical screening examination/treatment/procedure(s) were conducted as a shared visit with the resident physician and myself.  I personally evaluated the patient during the encounter  Pt here w/ back pain reproducible w/ palpation and worse w/ rotation of torso and movement, abdomen benign, previous CT/MRI w/ no note of AAA, VSS.  Screening plain films negative. Pt feeling better after pain control.   Date: 01/15/2013  Rate: 53  Rhythm: normal sinus rhythm  QRS Axis: normal  Intervals: PR prolonged  ST/T Wave abnormalities: nonspecific T wave changes  Conduction Disutrbances:first-degree A-V block   Narrative Interpretation: PR slightly more prolonged today in comparison to previous ecg. T wave inversions in V1-V3 not new today. No new ST or T wave changes cw ischemia.   Old EKG Reviewed: changes noted  8:58 AM:  I have discussed the diagnosis/risks/treatment options with the patient and believe the pt to be eligible for discharge home to follow-up with pcp as needed. We also discussed returning to the ED immediately if new or worsening sx occur. We discussed the sx which are most concerning (e.g., worsening pain, fever) that necessitate immediate return. Any new prescriptions provided to the patient are listed below.  Discharge Medication List as of 01/15/2013 12:59 PM    START taking these medications   Details  cyclobenzaprine (FLEXERIL) 5 MG tablet Take 1 tablet (5 mg total) by mouth 2 (two) times daily as needed for muscle spasms., Starting 01/15/2013, Until Discontinued, Print    oxyCODONE-acetaminophen (PERCOCET) 5-325 MG per tablet Take 2 tablets by mouth every 4 (four) hours as needed for pain., Starting 01/15/2013, Until Discontinued, Print         Junius Argyle, MD 01/16/13 4098  Junius Argyle, MD 01/28/13 1043

## 2013-01-15 NOTE — ED Notes (Signed)
Woke up w/ back pain this am no new injury hurts to walk and move

## 2013-01-15 NOTE — ED Notes (Signed)
Per [place], [place].  Patient needs to be moved out of FT and to another room due to acuity.   Sudden onset of back pain with no history of.

## 2013-01-23 ENCOUNTER — Encounter: Payer: Self-pay | Admitting: Family Medicine

## 2013-02-03 ENCOUNTER — Other Ambulatory Visit: Payer: Self-pay | Admitting: Physician Assistant

## 2013-02-11 ENCOUNTER — Ambulatory Visit: Payer: Medicare Other

## 2013-02-11 ENCOUNTER — Ambulatory Visit (INDEPENDENT_AMBULATORY_CARE_PROVIDER_SITE_OTHER): Payer: Medicare Other | Admitting: Internal Medicine

## 2013-02-11 VITALS — BP 130/74 | HR 62 | Temp 98.0°F | Resp 17 | Ht 71.0 in | Wt 198.0 lb

## 2013-02-11 DIAGNOSIS — R05 Cough: Secondary | ICD-10-CM

## 2013-02-11 DIAGNOSIS — R059 Cough, unspecified: Secondary | ICD-10-CM

## 2013-02-11 DIAGNOSIS — R51 Headache: Secondary | ICD-10-CM

## 2013-02-11 DIAGNOSIS — J329 Chronic sinusitis, unspecified: Secondary | ICD-10-CM | POA: Diagnosis not present

## 2013-02-11 DIAGNOSIS — R0602 Shortness of breath: Secondary | ICD-10-CM

## 2013-02-11 MED ORDER — HYDROCODONE-HOMATROPINE 5-1.5 MG/5ML PO SYRP: 5.0000 mL | ORAL_SOLUTION | Freq: Four times a day (QID) | ORAL | Status: DC | PRN

## 2013-02-11 MED ORDER — AMOXICILLIN 875 MG PO TABS: 875.0000 mg | ORAL_TABLET | Freq: Two times a day (BID) | ORAL | Status: DC

## 2013-02-11 NOTE — Progress Notes (Signed)
  Subjective:    Patient ID: Andre [name], male    DOB: 09-Apr-1941, 72 y.o.   MRN: [id number]  HPI Patient comes into our office with a headache that started yesterday around 3:00p.m. While just watching TV. It started on both sides of his temples now it has moved to the top oh his head Headache interfered with sleep Also had to sleep sitting up because of postnasal drainage and a cough that has been present for almost 3 weeks. The cough is occasionally productive Denies fever or night sweats but has some shortness of breath with activity. HX of stroke as noted 2013- he grinds his teeth since his stroke. He has had frequent headaches since his stroke and also frequent headaches prior to that thought secondary to an old injury. Left shoulder pain as noted in last exam is still present and interferes with activity   Patient Active Problem List   Diagnosis Date Noted  . Back strain 01/15/2013  . 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 History of CVA (cerebrovascular accident) without residual deficits 01/06/2013  . Headache 10/23/2011  . Rectal cancer 10/23/2011  . Abdominal pain, recurrent 10/23/2011  . GERD (gastroesophageal reflux disease) 10/23/2011  . Nicotine addiction 10/23/2011  . 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 Cervical stenosis of spinal canal 10/23/2011  . Steatosis of liver 10/23/2011  . H. pylori infection 10/23/2011  . Essential hypertension, benign 07/10/2011  . Chest pain 06/02/2011  . Palpitations 06/02/2011     Review of Systems  Constitutional: Negative for fever and chills.  HENT: Negative for ear pain, facial swelling and neck pain.   Eyes: Negative for visual disturbance.  Respiratory: Positive for shortness of breath.   Cardiovascular: Negative for chest pain.  Gastrointestinal: Negative for nausea and vomiting.  Neurological: Positive for headaches. Negative for dizziness and numbness.       Objective:   Physical Exam BP 130/74  Pulse 62  Temp(Src)  98 F (36.7 C) (Oral)  Resp 17  Ht 5\' 11"  (1.803 m)  Wt 198 lb (89.812 kg)  BMI 27.63 kg/m2  SpO2 98% No acute distress Pupils equal round reactive to light and accommodation/EOMs conjugate Nares boggy with purulent discharge Throat clear Tender in the left trapezius Tender over the deltoid left/pain increases with abduction past 45 Lungs with diminished breath sounds at the bases but no Rales or wheezing Cranial nerves II through XII intact Romberg negative Gait stable Deep tendon reflexes symmetrical Thought content normal/oriented to time person and place      [facility] reading (PRIMARY) by  Dr.Adamariz Gillott= no acute infiltrate in the lungs/no air fluid levels in the sinuses   Assessment & Plan:  Problem #1 acute and chronic headaches Problem #2 sinusitis Problem #3 cough interfering with sleep Problem #4 shoulder pain/frozen shoulder  Meds ordered this encounter  Medications  . amoxicillin (AMOXIL) 875 MG tablet    Sig: Take 1 tablet (875 mg total) by mouth 2 (two) times daily.    Dispense:  20 tablet    Refill:  0  . HYDROcodone-homatropine (HYCODAN) 5-1.5 MG/5ML syrup    Sig: Take 5 mLs by mouth every 6 (six) hours as needed for cough. Or headache    Dispense:  120 mL    Refill:  0   Gave another set of exercises for the shoulder like at his office visit only these were much more simple to see if he could adhear to home PT program

## 2013-02-12 DIAGNOSIS — K5900 Constipation, unspecified: Secondary | ICD-10-CM | POA: Diagnosis not present

## 2013-02-12 DIAGNOSIS — K219 Gastro-esophageal reflux disease without esophagitis: Secondary | ICD-10-CM | POA: Diagnosis not present

## 2013-02-19 ENCOUNTER — Encounter: Payer: Self-pay | Admitting: Family Medicine

## 2013-02-19 DIAGNOSIS — K219 Gastro-esophageal reflux disease without esophagitis: Secondary | ICD-10-CM | POA: Insufficient documentation

## 2013-02-19 DIAGNOSIS — K5900 Constipation, unspecified: Secondary | ICD-10-CM

## 2013-03-12 DIAGNOSIS — K5900 Constipation, unspecified: Secondary | ICD-10-CM | POA: Diagnosis not present

## 2013-04-24 ENCOUNTER — Ambulatory Visit (INDEPENDENT_AMBULATORY_CARE_PROVIDER_SITE_OTHER): Payer: Medicare Other | Admitting: Family Medicine

## 2013-04-24 ENCOUNTER — Ambulatory Visit: Payer: Medicare Other

## 2013-04-24 VITALS — BP 118/72 | HR 65 | Temp 97.8°F | Resp 18 | Ht 71.0 in | Wt 196.0 lb

## 2013-04-24 DIAGNOSIS — R05 Cough: Secondary | ICD-10-CM

## 2013-04-24 DIAGNOSIS — R51 Headache: Secondary | ICD-10-CM

## 2013-04-24 DIAGNOSIS — K219 Gastro-esophageal reflux disease without esophagitis: Secondary | ICD-10-CM | POA: Diagnosis not present

## 2013-04-24 DIAGNOSIS — Z8673 Personal history of transient ischemic attack (TIA), and cerebral infarction without residual deficits: Secondary | ICD-10-CM | POA: Diagnosis not present

## 2013-04-24 DIAGNOSIS — J40 Bronchitis, not specified as acute or chronic: Secondary | ICD-10-CM

## 2013-04-24 MED ORDER — OMEPRAZOLE 20 MG PO CPDR: 20.0000 mg | DELAYED_RELEASE_CAPSULE | Freq: Every day | ORAL | Status: DC

## 2013-04-24 MED ORDER — AZITHROMYCIN 250 MG PO TABS: ORAL_TABLET | ORAL | Status: DC

## 2013-04-24 NOTE — Progress Notes (Addendum)
Subjective:    Patient ID: Andre [name], male    DOB: [birth year], 72 y.o.   MRN: [id number]  This chart was scribed for Andre Staggers, MD by [name], ED Scribe.   HPI  PCP- Dr. [name]  Andre [name] is a 72 y.o. male who presents to [facility] complaining of intermittent headache for the past few years. Patient was seen by Dr. Merla Riches in September for similar complaints. He was noted to have frequent headaches since a CVA in 2013, but also had similar episodes of headache prior to the CVA that was thought to be due to an old injury (suspected post-concussion from November, 2012). He reports that his current symptoms feel similar to his prior episodes, but that it has moved from the right to left side, and has been more frequent over the past couple of months. He reports that he takes Aspirin daily for the headaches. He denies extremity weakness, difficult speech, or acute vision changes. Patient reports that he has not followed up with a neurologist for these headaches.  Patient also complains of a cough with associated white, mucus-like sputum onset about a month ago that has been progressively worsening. He reports associated chest pain and shoulder pain secondary to the cough. He reports taking Mucinex at home for the past 3 weeks. He denies any sick contacts. Patient had a cardiac catheterization in January 2013 widely patent arteries and was diagnosed with non-cardiac chest pain at that time. He denies fever, emesis, congestion, and postnasal drip. He reports a history of GERD that he manages with Tums (takes one dose every 2 days or as needed). He reports a history of HTN that is well-controlled with Lisinopril, propanolol, and Norvasc. Patient denies history of MI or other cardiac or pulmonary problems. He reports a history of alcohol use, but states that he has cut back to approximately 2 drinks per week.   Patient Active Problem List   Diagnosis Date Noted  .  Unspecified constipation 02/19/2013  . Esophageal reflux 02/19/2013  . Back strain 01/15/2013  . 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 History of CVA (cerebrovascular accident) without residual deficits 01/06/2013  . Headache 10/23/2011  . Rectal cancer 10/23/2011  . Abdominal pain, recurrent 10/23/2011  . GERD (gastroesophageal reflux disease) 10/23/2011  . Nicotine addiction 10/23/2011  . 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 Cervical stenosis of spinal canal 10/23/2011  . Steatosis of liver 10/23/2011  . H. pylori infection 10/23/2011  . Essential hypertension, benign 07/10/2011  . Chest pain 06/02/2011  . Palpitations 06/02/2011   Past Medical History  Diagnosis Date  . Arthritis   . Hypertension   . Rectal cancer    Past Surgical History  Procedure Laterality Date  . Surgical excision of rectal cancer     No Known Allergies Prior to Admission medications   Medication Sig Start Date End Date Taking? Authorizing Provider  amLODipine (NORVASC) 5 MG tablet Take 5 mg by mouth daily.   Yes Historical Provider, MD  aspirin EC 81 MG tablet Take 81 mg by mouth every 4 (four) hours as needed for pain.    Yes Historical Provider, MD  lisinopril (PRINIVIL,ZESTRIL) 10 MG tablet Take 10 mg by mouth daily.   Yes Historical Provider, MD  propranolol (INDERAL) 40 MG tablet Take 40 mg by mouth 2 (two) times daily.   Yes Historical Provider, MD  amoxicillin (AMOXIL) 875 MG tablet Take 1 tablet (875 mg total) by mouth 2 (two) times  daily. 02/11/13   Tonye Pearson, MD  cyclobenzaprine (FLEXERIL) 5 MG tablet Take 1 tablet (5 mg total) by mouth 2 (two) times daily as needed for muscle spasms. 01/15/13   Junius Argyle, MD  dexlansoprazole (DEXILANT) 60 MG capsule Take 60 mg by mouth daily.    Historical Provider, MD  HYDROcodone-homatropine (HYCODAN) 5-1.5 MG/5ML syrup Take 5 mLs by mouth every 6 (six) hours as needed for cough. Or headache 02/11/13   Tonye Pearson, MD  lubiprostone  (AMITIZA) 24 MCG capsule Take 24 mcg by mouth 2 (two) times daily with a meal.    Historical Provider, MD  meloxicam (MOBIC) 7.5 MG tablet Take 7.5 mg by mouth 2 (two) times daily.    Historical Provider, MD  methocarbamol (ROBAXIN) 500 MG tablet Take 500 mg by mouth 4 (four) times daily as needed (muscle spasms).    Historical Provider, MD  metoCLOPramide (REGLAN) 10 MG tablet Take 10 mg by mouth 3 (three) times daily as needed (nausea).    Historical Provider, MD   History   Social History  . Marital Status: Single    Spouse Name: N/A    Number of Children: N/A  . Years of Education: N/A   Occupational History  . Not on file.   Social History Main Topics  . Smoking status: Current Every Day Smoker -- 0.50 packs/day for 47 years    Types: Cigarettes    Last Attempt to Quit: 09/17/2009  . Smokeless tobacco: Never Used  . Alcohol Use: Yes     Comment: heavy drinker, last alcohol was yesterday   . Drug Use: No  . Sexual Activity: Not on file   Other Topics Concern  . Not on file   Social History Narrative  . No narrative on file    Review of Systems  Constitutional: Negative for fever.  HENT: Negative for congestion and postnasal drip.   Eyes: Negative for visual disturbance.  Respiratory: Positive for cough.   Cardiovascular: Positive for chest pain.  Musculoskeletal: Positive for arthralgias.  Neurological: Positive for headaches. Negative for speech difficulty and weakness.       Objective:   Physical Exam  Vitals reviewed. Constitutional: He is oriented to person, place, and time. He appears well-developed and well-nourished.  HENT:  Head: Normocephalic and atraumatic.  Right Ear: Tympanic membrane, external ear and ear canal normal.  Left Ear: Tympanic membrane, external ear and ear canal normal.  Nose: No rhinorrhea.  Mouth/Throat: Oropharynx is clear and moist and mucous membranes are normal. No oropharyngeal exudate or posterior oropharyngeal erythema.   Eyes: Conjunctivae are normal. Pupils are equal, round, and reactive to light.  Neck: Neck supple.  Cardiovascular: Normal rate, regular rhythm, normal heart sounds and intact distal pulses.   No murmur heard. Pulmonary/Chest: Effort normal and breath sounds normal. He has no wheezes. He has no rhonchi. He has no rales.  Abdominal: Soft. There is no tenderness.  Lymphadenopathy:    He has no cervical adenopathy.  Neurological: He is alert and oriented to person, place, and time.  Grip strength and shoulder shrug equal bilaterally. Lower extremity strength equal bilaterally. No focal weakness of facial musculature. Negative Romberg. Negative pronator drift. Normal heel-to-toe.   Skin: Skin is warm and dry. No rash noted.  Psychiatric: He has a normal mood and affect. His behavior is normal.    BP 118/72  Pulse 65  Temp(Src) 97.8 F (36.6 C) (Oral)  Resp 18  Ht 5\' 11"  (1.803 m)  Wt 196 lb (88.905 kg)  BMI 27.35 kg/m2  SpO2 99%   [facility] reading (PRIMARY) by  Dr. Neva Seat: CXR: few increased RLL markings without discrete infiltrate.     Assessment & Plan:  8:49 AM- Will order a chest x-ray to rule out pneumonia. Advised patient to follow up with the referred neurologist for his ongoing headaches. Discussed treatment plan with patient at bedside and patient verbalized agreement.  Cherokee Clowers is a 72 y.o. male Cough - Plan: DG Chest 2 View, azithromycin (ZITHROMAX) 250 MG tablet  Headache(784.0) - Plan: Ambulatory referral to Neurology  History of CVA (cerebrovascular accident) - Plan: Ambulatory referral to Neurology  GERD (gastroesophageal reflux disease) - Plan: omeprazole (PRILOSEC) 20 MG capsule  Bronchitis - Plan: azithromycin (ZITHROMAX) 250 MG tablet   Cough - GERD with possible secondary bronchitis. Zpak, trigger avoidance for reflux, and trial of prilosec each day. Cont mucinex, rtc precautions.   HA - chronic.  Refer to neuro for eval with Hx of CVA, and some increase in  frequency but rtc/er precautions if acute changes. nonfocal exam at present.   Meds ordered this encounter  Medications  . omeprazole (PRILOSEC) 20 MG capsule    Sig: Take 1 capsule (20 mg total) by mouth daily.    Dispense:  30 capsule    Refill:  1  . azithromycin (ZITHROMAX) 250 MG tablet    Sig: Take 2 pills by mouth on day 1, then 1 pill by mouth per day on days 2 through 5.    Dispense:  6 each    Refill:  0   Patient Instructions  We will refer you to a neurologist for your headaches, but if any new oar acutely worsening symptoms - be seen in the emergency room right away or call 911.  Start omeprazole for heartburn and avoid known causes as listed below. Continue mucinex, and can start antibiotic for possible bronchitis. Return to the clinic or go to the nearest emergency room if any of your symptoms worsen or new symptoms occur.    Diet for Gastroesophageal Reflux Disease, Adult Reflux is when stomach acid flows up into the esophagus. The esophagus becomes irritated and sore (inflammation). When reflux happens often and is severe, it is called gastroesophageal reflux disease (GERD). What you eat can help ease any discomfort caused by GERD. FOODS OR DRINKS TO AVOID OR LIMIT  Coffee and black tea, with or without caffeine.  Bubbly (carbonated) drinks with caffeine or energy drinks.  Strong spices, such as pepper, cayenne pepper, curry, or chili powder.  Peppermint or spearmint.  Chocolate.  High-fat foods, such as meats, fried food, oils, butter, or nuts.  Fruits and vegetables that cause discomfort. This includes citrus fruits and tomatoes.  Alcohol. If a certain food or drink irritates your GERD, avoid eating or drinking it. THINGS THAT MAY HELP GERD INCLUDE:  Eat meals slowly.  Eat 5 to 6 small meals a day, not 3 large meals.  Do not eat food for a certain amount of time if it causes discomfort.  Wait 3 hours after eating before lying down.  Keep the head of  your bed raised 6 to 9 inches (15 23 centimeters). Put a foam wedge or blocks under the legs of the bed.  Stay active. Weight loss, if needed, may help ease your discomfort.  Wear loose-fitting clothing.  Do not smoke or chew tobacco. Document Released: 11/28/2011 Document Reviewed: 11/28/2011 [facility] Patient Information 2014 [place], [place].   I personally performed  the services described in this documentation, which was scribed in my presence. The recorded information has been reviewed and considered, and addended by me as needed.

## 2013-04-24 NOTE — Patient Instructions (Addendum)
We will refer you to a neurologist for your headaches, but if any new oar acutely worsening symptoms - be seen in the emergency room right away or call 911.  Start omeprazole for heartburn and avoid known causes as listed below. Continue mucinex, and can start antibiotic for possible bronchitis. Return to the clinic or go to the nearest emergency room if any of your symptoms worsen or new symptoms occur.    Diet for Gastroesophageal Reflux Disease, Adult Reflux is when stomach acid flows up into the esophagus. The esophagus becomes irritated and sore (inflammation). When reflux happens often and is severe, it is called gastroesophageal reflux disease (GERD). What you eat can help ease any discomfort caused by GERD. FOODS OR DRINKS TO AVOID OR LIMIT  Coffee and black tea, with or without caffeine.  Bubbly (carbonated) drinks with caffeine or energy drinks.  Strong spices, such as pepper, cayenne pepper, curry, or chili powder.  Peppermint or spearmint.  Chocolate.  High-fat foods, such as meats, fried food, oils, butter, or nuts.  Fruits and vegetables that cause discomfort. This includes citrus fruits and tomatoes.  Alcohol. If a certain food or drink irritates your GERD, avoid eating or drinking it. THINGS THAT MAY HELP GERD INCLUDE:  Eat meals slowly.  Eat 5 to 6 small meals a day, not 3 large meals.  Do not eat food for a certain amount of time if it causes discomfort.  Wait 3 hours after eating before lying down.  Keep the head of your bed raised 6 to 9 inches (15 23 centimeters). Put a foam wedge or blocks under the legs of the bed.  Stay active. Weight loss, if needed, may help ease your discomfort.  Wear loose-fitting clothing.  Do not smoke or chew tobacco. Document Released: 11/28/2011 Document Reviewed: 11/28/2011 [facility] Patient Information 2014 [place], [place].

## 2013-05-09 ENCOUNTER — Ambulatory Visit (INDEPENDENT_AMBULATORY_CARE_PROVIDER_SITE_OTHER): Payer: Medicare Other | Admitting: Emergency Medicine

## 2013-05-09 ENCOUNTER — Other Ambulatory Visit: Payer: Self-pay | Admitting: Emergency Medicine

## 2013-05-09 ENCOUNTER — Other Ambulatory Visit: Payer: Self-pay | Admitting: Family Medicine

## 2013-05-09 VITALS — BP 140/100 | HR 62 | Temp 98.0°F | Resp 16 | Ht 70.0 in | Wt 193.6 lb

## 2013-05-09 DIAGNOSIS — K219 Gastro-esophageal reflux disease without esophagitis: Secondary | ICD-10-CM | POA: Diagnosis not present

## 2013-05-09 DIAGNOSIS — E869 Volume depletion, unspecified: Secondary | ICD-10-CM | POA: Diagnosis not present

## 2013-05-09 DIAGNOSIS — R079 Chest pain, unspecified: Secondary | ICD-10-CM

## 2013-05-09 LAB — COMPREHENSIVE METABOLIC PANEL
ALT: 21 U/L (ref 0–53)
BUN: 6 mg/dL (ref 6–23)
CO2: 29 mEq/L (ref 19–32)
Creat: 0.75 mg/dL (ref 0.50–1.35)
Total Bilirubin: 1 mg/dL (ref 0.3–1.2)

## 2013-05-09 LAB — POCT CBC
Lymph, poc: 2.6 (ref 0.6–3.4)
MCH, POC: 31.3 pg — AB (ref 27–31.2)
MCHC: 31.9 g/dL (ref 31.8–35.4)
MID (cbc): 0.4 (ref 0–0.9)
MPV: 6.9 fL (ref 0–99.8)
POC MID %: 8 %M (ref 0–12)
Platelet Count, POC: 234 10*3/uL (ref 142–424)
WBC: 5.3 10*3/uL (ref 4.6–10.2)

## 2013-05-09 MED ORDER — LANSOPRAZOLE 30 MG PO CPDR: 30.0000 mg | DELAYED_RELEASE_CAPSULE | Freq: Every day | ORAL | Status: DC

## 2013-05-09 MED ORDER — SUCRALFATE 1 G PO TABS: ORAL_TABLET | ORAL | Status: DC

## 2013-05-09 MED ORDER — AMOXICILL-CLARITHRO-LANSOPRAZ PO MISC: Freq: Two times a day (BID) | ORAL | Status: DC

## 2013-05-09 NOTE — Patient Instructions (Signed)
Gastroesophageal Reflux Disease, Adult  Gastroesophageal reflux disease (GERD) happens when acid from your stomach flows up into the esophagus. When acid comes in contact with the esophagus, the acid causes soreness (inflammation) in the esophagus. Over time, GERD may create small holes (ulcers) in the lining of the esophagus.  CAUSES   · Increased body weight. This puts pressure on the stomach, making acid rise from the stomach into the esophagus.  · Smoking. This increases acid production in the stomach.  · Drinking alcohol. This causes decreased pressure in the lower esophageal sphincter (valve or ring of muscle between the esophagus and stomach), allowing acid from the stomach into the esophagus.  · Late evening meals and a full stomach. This increases pressure and acid production in the stomach.  · A malformed lower esophageal sphincter.  Sometimes, no cause is found.  SYMPTOMS   · Burning pain in the lower part of the mid-chest behind the breastbone and in the mid-stomach area. This may occur twice a week or more often.  · Trouble swallowing.  · Sore throat.  · Dry cough.  · Asthma-like symptoms including chest tightness, shortness of breath, or wheezing.  DIAGNOSIS   Your caregiver may be able to diagnose GERD based on your symptoms. In some cases, X-rays and other tests may be done to check for complications or to check the condition of your stomach and esophagus.  TREATMENT   Your caregiver may recommend over-the-counter or prescription medicines to help decrease acid production. Ask your caregiver before starting or adding any new medicines.   HOME CARE INSTRUCTIONS   · Change the factors that you can control. Ask your caregiver for guidance concerning weight loss, quitting smoking, and alcohol consumption.  · Avoid foods and drinks that make your symptoms worse, such as:  · Caffeine or alcoholic drinks.  · Chocolate.  · Peppermint or mint flavorings.  · Garlic and onions.  · Spicy foods.  · Citrus fruits,  such as oranges, lemons, or limes.  · Tomato-based foods such as sauce, chili, salsa, and pizza.  · Fried and fatty foods.  · Avoid lying down for the 3 hours prior to your bedtime or prior to taking a nap.  · Eat small, frequent meals instead of large meals.  · Wear loose-fitting clothing. Do not wear anything tight around your waist that causes pressure on your stomach.  · Raise the head of your bed 6 to 8 inches with wood blocks to help you sleep. Extra pillows will not help.  · Only take over-the-counter or prescription medicines for pain, discomfort, or fever as directed by your caregiver.  · Do not take aspirin, ibuprofen, or other nonsteroidal anti-inflammatory drugs (NSAIDs).  SEEK IMMEDIATE MEDICAL CARE IF:   · You have pain in your arms, neck, jaw, teeth, or back.  · Your pain increases or changes in intensity or duration.  · You develop nausea, vomiting, or sweating (diaphoresis).  · You develop shortness of breath, or you faint.  · Your vomit is green, yellow, black, or looks like coffee grounds or blood.  · Your stool is red, bloody, or black.  These symptoms could be signs of other problems, such as heart disease, gastric bleeding, or esophageal bleeding.  MAKE SURE YOU:   · Understand these instructions.  · Will watch your condition.  · Will get help right away if you are not doing well or get worse.  Document Released: 03/08/2005 Document Revised: 08/21/2011 Document Reviewed: 12/16/2010  [employer]® Patient   Information ©2014 [employer].

## 2013-05-09 NOTE — Progress Notes (Signed)
[facility] [address], [place] [address] [phone]  Date:  05/09/2013   Name:  Andre [name]   DOB:  1941/03/05   MRN:  [id number]  PCP:  [name], MD    Chief Complaint: Follow-up   History of Present Illness:  Andre [name] is a 72 y.o. very pleasant male patient who presents with the following:  History of a cough for past month.  Seen two weeks ago with bronchitis and GERD and a negative chest xray.  Says his cough is better with the medication but is continuing to cough up white mucous.  Has no fever or chills.  Has developed chest pain that he describes as a burning pain across his chest.  Worse with recumbent posture.  Has frequent waterbrash.  No nausea or vomiting or stool change.  No blood in stools or black stools or vomiting blood.  Says pain began after his last visit and is worse.  Little change with prilosec.  Denies other complaint or health concern today.   Patient Active Problem List   Diagnosis Date Noted  . Unspecified constipation 02/19/2013  . Esophageal reflux 02/19/2013  . Back strain 01/15/2013  . 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 History of CVA (cerebrovascular accident) without residual deficits 01/06/2013  . Headache 10/23/2011  . Rectal cancer 10/23/2011  . Abdominal pain, recurrent 10/23/2011  . GERD (gastroesophageal reflux disease) 10/23/2011  . Nicotine addiction 10/23/2011  . 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 Cervical stenosis of spinal canal 10/23/2011  . Steatosis of liver 10/23/2011  . H. pylori infection 10/23/2011  . Essential hypertension, benign 07/10/2011  . Chest pain 06/02/2011  . Palpitations 06/02/2011    Past Medical History  Diagnosis Date  . Arthritis   . Hypertension   . Rectal cancer     Past Surgical History  Procedure Laterality Date  . Surgical excision of rectal cancer      History  Substance Use Topics  . Smoking status: Current Every Day Smoker -- 0.50 packs/day for 47  years    Types: Cigarettes    Last Attempt to Quit: 09/17/2009  . Smokeless tobacco: Never Used  . Alcohol Use: Yes     Comment: heavy drinker, last alcohol was yesterday     Family History  Problem Relation Age of Onset  . Hypertension Other     No Known Allergies  Medication list has been reviewed and updated.  Current Outpatient Prescriptions on File Prior to Visit  Medication Sig Dispense Refill  . amLODipine (NORVASC) 5 MG tablet Take 5 mg by mouth daily.      Marland Kitchen aspirin EC 81 MG tablet Take 81 mg by mouth every 4 (four) hours as needed for pain.       . cyclobenzaprine (FLEXERIL) 5 MG tablet Take 1 tablet (5 mg total) by mouth 2 (two) times daily as needed for muscle spasms.  6 tablet  0  . dexlansoprazole (DEXILANT) 60 MG capsule Take 60 mg by mouth daily.      Marland Kitchen lisinopril (PRINIVIL,ZESTRIL) 10 MG tablet Take 10 mg by mouth daily.      Marland Kitchen lubiprostone (AMITIZA) 24 MCG capsule Take 24 mcg by mouth 2 (two) times daily with a meal.      . meloxicam (MOBIC) 7.5 MG tablet Take 7.5 mg by mouth 2 (two) times daily.      . methocarbamol (ROBAXIN) 500 MG tablet Take 500 mg by mouth 4 (four) times daily as  needed (muscle spasms).      . metoCLOPramide (REGLAN) 10 MG tablet Take 10 mg by mouth 3 (three) times daily as needed (nausea).      Marland Kitchen omeprazole (PRILOSEC) 20 MG capsule Take 1 capsule (20 mg total) by mouth daily.  30 capsule  1  . propranolol (INDERAL) 40 MG tablet Take 40 mg by mouth 2 (two) times daily.      Marland Kitchen amoxicillin (AMOXIL) 875 MG tablet Take 1 tablet (875 mg total) by mouth 2 (two) times daily.  20 tablet  0  . azithromycin (ZITHROMAX) 250 MG tablet Take 2 pills by mouth on day 1, then 1 pill by mouth per day on days 2 through 5.  6 each  0  . HYDROcodone-homatropine (HYCODAN) 5-1.5 MG/5ML syrup Take 5 mLs by mouth every 6 (six) hours as needed for cough. Or headache  120 mL  0  . [DISCONTINUED] chlorthalidone (HYGROTON) 25 MG tablet Take 25 mg by mouth daily.       No  current facility-administered medications on file prior to visit.    Review of Systems:  As per HPI, otherwise negative.    Physical Examination: Filed Vitals:   05/09/13 0816  BP: 140/100  Pulse: 62  Temp: 98 F (36.7 C)  Resp: 16   Filed Vitals:   05/09/13 0816  Height: 5\' 10"  (1.778 m)  Weight: 193 lb 9.6 oz (87.816 kg)   Body mass index is 27.78 kg/(m^2). Ideal Body Weight: Weight in (lb) to have BMI = 25: 173.9  GEN: WDWN, NAD, Non-toxic, A & O x 3 HEENT: Atraumatic, Normocephalic. Neck supple. No masses, No LAD. Ears and Nose: No external deformity. CV: RRR, No M/G/R. No JVD. No thrill. No extra heart sounds. PULM: CTA B, no wheezes, crackles, rhonchi. No retractions. No resp. distress. No accessory muscle use. ABD: S, NT, ND, +BS. No rebound. No HSM. EXTR: No c/c/e NEURO Normal gait.  PSYCH: Normally interactive. Conversant. Not depressed or anxious appearing.  Calm demeanor.    Assessment and Plan: Chest pain GERD Stop prilosec carafate Prevacid  Signed,  Phillips Odor, MD   Results for orders placed in visit on 05/09/13  POCT CBC      Result Value Range   WBC 5.3  4.6 - 10.2 K/uL   Lymph, poc 2.6  0.6 - 3.4   POC LYMPH PERCENT 48.9  10 - 50 %L   MID (cbc) 0.4  0 - 0.9   POC MID % 8.0  0 - 12 %M   POC Granulocyte 2.3  2 - 6.9   Granulocyte percent 43.1  37 - 80 %G   RBC 5.08  4.69 - 6.13 M/uL   Hemoglobin 15.9  14.1 - 18.1 g/dL   HCT, POC 96.0  45.4 - 53.7 %   MCV 98.3 (*) 80 - 97 fL   MCH, POC 31.3 (*) 27 - 31.2 pg   MCHC 31.9  31.8 - 35.4 g/dL   RDW, POC 09.8     Platelet Count, POC 234  142 - 424 K/uL   MPV 6.9  0 - 99.8 fL

## 2013-05-12 ENCOUNTER — Other Ambulatory Visit: Payer: Self-pay | Admitting: Radiology

## 2013-05-12 DIAGNOSIS — A048 Other specified bacterial intestinal infections: Secondary | ICD-10-CM

## 2013-05-12 LAB — H. PYLORI ANTIBODY, IGG: H Pylori IgG: 1.71 {ISR} — ABNORMAL HIGH

## 2013-05-12 NOTE — Telephone Encounter (Signed)
Prev pack not covered by insurance, will you split up and send in? Pended (patient already has prevacid Rx) but not sure of quantity please fill in and submit

## 2013-05-13 MED ORDER — AMOXICILLIN 500 MG PO TABS: 1000.0000 mg | ORAL_TABLET | Freq: Two times a day (BID) | ORAL | Status: DC

## 2013-05-13 MED ORDER — CLARITHROMYCIN ER 500 MG PO TB24: 1000.0000 mg | ORAL_TABLET | Freq: Every day | ORAL | Status: DC

## 2013-05-22 ENCOUNTER — Other Ambulatory Visit: Payer: Self-pay

## 2013-05-22 MED ORDER — LISINOPRIL 10 MG PO TABS: 10.0000 mg | ORAL_TABLET | Freq: Every day | ORAL | Status: DC

## 2013-05-24 ENCOUNTER — Emergency Department (HOSPITAL_COMMUNITY): Payer: Medicare Other

## 2013-05-24 ENCOUNTER — Encounter (HOSPITAL_COMMUNITY): Payer: Self-pay | Admitting: Emergency Medicine

## 2013-05-24 ENCOUNTER — Emergency Department (HOSPITAL_COMMUNITY)
Admission: EM | Admit: 2013-05-24 | Discharge: 2013-05-24 | Disposition: A | Discharge status: Home / Self Care | Payer: Medicare Other | Attending: Emergency Medicine | Admitting: Emergency Medicine

## 2013-05-24 DIAGNOSIS — F17200 Nicotine dependence, unspecified, uncomplicated: Secondary | ICD-10-CM | POA: Insufficient documentation

## 2013-05-24 DIAGNOSIS — R05 Cough: Secondary | ICD-10-CM | POA: Diagnosis not present

## 2013-05-24 DIAGNOSIS — M129 Arthropathy, unspecified: Secondary | ICD-10-CM | POA: Diagnosis not present

## 2013-05-24 DIAGNOSIS — Z85048 Personal history of other malignant neoplasm of rectum, rectosigmoid junction, and anus: Secondary | ICD-10-CM | POA: Diagnosis not present

## 2013-05-24 DIAGNOSIS — R0789 Other chest pain: Secondary | ICD-10-CM | POA: Insufficient documentation

## 2013-05-24 DIAGNOSIS — R51 Headache: Secondary | ICD-10-CM | POA: Diagnosis not present

## 2013-05-24 DIAGNOSIS — I10 Essential (primary) hypertension: Secondary | ICD-10-CM | POA: Diagnosis not present

## 2013-05-24 DIAGNOSIS — Z79899 Other long term (current) drug therapy: Secondary | ICD-10-CM | POA: Insufficient documentation

## 2013-05-24 MED ORDER — HYDROCODONE-ACETAMINOPHEN 5-325 MG PO TABS
1.0000 | ORAL_TABLET | Freq: Once | ORAL | Status: AC
  Administered 2013-05-24: 1 via ORAL
  Filled 2013-05-24: qty 1

## 2013-05-24 MED ORDER — TRAMADOL HCL 50 MG PO TABS: 50.0000 mg | ORAL_TABLET | Freq: Three times a day (TID) | ORAL | Status: DC | PRN

## 2013-05-24 MED ORDER — KETOROLAC TROMETHAMINE 30 MG/ML IJ SOLN
30.0000 mg | Freq: Once | INTRAMUSCULAR | Status: AC
  Administered 2013-05-24: 30 mg via INTRAMUSCULAR
  Filled 2013-05-24: qty 1

## 2013-05-24 MED ORDER — DEXAMETHASONE 6 MG PO TABS
10.0000 mg | ORAL_TABLET | Freq: Once | ORAL | Status: AC
  Administered 2013-05-24: 10 mg via ORAL
  Filled 2013-05-24 (×2): qty 1

## 2013-05-24 NOTE — ED Notes (Signed)
Pt. reports chronic headache for 4 weeks , pain got worse these past several days , denies injury , respirations unlabored.

## 2013-05-24 NOTE — ED Provider Notes (Signed)
CSN: [id number]     Arrival date & time 05/24/13  0541 History   First MD Initiated Contact with Patient 05/24/13 0703     Chief Complaint  Patient presents with  . Headache   (Consider location/radiation/quality/duration/timing/severity/associated sxs/prior Treatment) HPI Patient presents with concerns of headache.  She has a long history of headaches, though with a more severe episode today. This episode began gradually, approximately 2 days ago.  Since onset has been persistent in spite of using Hohmann medication. The pain is focally about the left frontal area, sore, severe. There is mild radiation down the left neck no other radiation. No no no syncope, fall, visual changes, vomiting, nausea. Patient complains of ongoing worsening chest congestion and pressure. No true chest pain, no exertional chest pain. Patient states it is compliant with all medication.  Past Medical History  Diagnosis Date  . Arthritis   . Hypertension   . Rectal cancer    Past Surgical History  Procedure Laterality Date  . Surgical excision of rectal cancer     Family History  Problem Relation Age of Onset  . Hypertension Other    History  Substance Use Topics  . Smoking status: Current Every Day Smoker -- 0.50 packs/day for 47 years    Types: Cigarettes    Last Attempt to Quit: 09/17/2009  . Smokeless tobacco: Never Used  . Alcohol Use: Yes     Comment: heavy drinker, last alcohol was yesterday     Review of Systems  Constitutional:       Per HPI, otherwise negative  HENT:       Per HPI, otherwise negative  Respiratory:       Per HPI, otherwise negative  Cardiovascular:       Per HPI, otherwise negative  Gastrointestinal: Negative for vomiting.  Endocrine:       Negative aside from HPI  Genitourinary:       Neg aside from HPI   Musculoskeletal:       Per HPI, otherwise negative  Skin: Negative.   Neurological: Positive for headaches. Negative for syncope and weakness.     Allergies  Review of patient's allergies indicates no known allergies.  Home Medications   Current Outpatient Rx  Name  Route  Sig  Dispense  Refill  . amLODipine (NORVASC) 5 MG tablet   Oral   Take 5 mg by mouth daily.         Marland Kitchen aspirin EC 81 MG tablet   Oral   Take 81 mg by mouth every 4 (four) hours as needed for moderate pain.          Marland Kitchen dexlansoprazole (DEXILANT) 60 MG capsule   Oral   Take 60 mg by mouth daily.         Marland Kitchen lisinopril (PRINIVIL,ZESTRIL) 10 MG tablet   Oral   Take 1 tablet (10 mg total) by mouth daily.   30 tablet   1   . lubiprostone (AMITIZA) 24 MCG capsule   Oral   Take 24 mcg by mouth every other day.          . meloxicam (MOBIC) 7.5 MG tablet   Oral   Take 7.5 mg by mouth 2 (two) times daily.         . methocarbamol (ROBAXIN) 500 MG tablet   Oral   Take 500 mg by mouth 4 (four) times daily as needed (muscle spasms).         . metoCLOPramide (REGLAN) 10 MG  tablet   Oral   Take 10 mg by mouth 3 (three) times daily as needed (nausea).         . propranolol (INDERAL) 40 MG tablet   Oral   Take 40 mg by mouth 2 (two) times daily.         . sucralfate (CARAFATE) 1 G tablet      1 tab 1hr ac and hs   120 tablet   0   . traMADol (ULTRAM) 50 MG tablet   Oral   Take 50 mg by mouth 3 (three) times daily as needed.           BP 131/81  Pulse 69  Temp(Src) 97.9 F (36.6 C) (Oral)  Resp 12  SpO2 100% Physical Exam  Nursing note and vitals reviewed. Constitutional: He is oriented to person, place, and time. He appears well-developed. No distress.  HENT:  Head: Normocephalic and atraumatic.  Eyes: Conjunctivae and EOM are normal.  Neck: No tracheal deviation present.  Cardiovascular: Normal rate and regular rhythm.   Pulmonary/Chest: Effort normal. No stridor. No respiratory distress.  Abdominal: He exhibits no distension.  Musculoskeletal: He exhibits no edema.  Neurological: He is alert and oriented to person,  place, and time. No cranial nerve deficit. He exhibits normal muscle tone. Coordination normal.  Skin: Skin is warm and dry.  Psychiatric: He has a normal mood and affect.    ED Course  Procedures (including critical care time) Labs Review Labs Reviewed - No data to display Imaging Review No results found.  EKG Interpretation   None      After the initial evaluation I reviewed the patient's chart.  He is a diagnosed history of headaches dating back to stroke several years ago.   10:13 AM Patient appears better. No new complaints. I discussed return precautions, follow up instructions.  MDM  No diagnosis found. This patient with a history of headaches, prior stroke now presents with ongoing headache.  On exam is awake alert, neurovascularly intact, hemodynamically stable.  Patient had some improvement here, was discharged in stable condition to follow up with primary care.    Gerhard Munch, MD 05/24/13 1013

## 2013-05-30 ENCOUNTER — Encounter: Payer: Self-pay | Admitting: Family Medicine

## 2013-05-30 DIAGNOSIS — K5900 Constipation, unspecified: Secondary | ICD-10-CM

## 2013-06-04 DIAGNOSIS — I10 Essential (primary) hypertension: Secondary | ICD-10-CM | POA: Insufficient documentation

## 2013-06-04 DIAGNOSIS — Z85048 Personal history of other malignant neoplasm of rectum, rectosigmoid junction, and anus: Secondary | ICD-10-CM | POA: Insufficient documentation

## 2013-06-04 DIAGNOSIS — R079 Chest pain, unspecified: Secondary | ICD-10-CM | POA: Diagnosis not present

## 2013-06-04 DIAGNOSIS — F17200 Nicotine dependence, unspecified, uncomplicated: Secondary | ICD-10-CM | POA: Diagnosis not present

## 2013-06-04 LAB — COMPREHENSIVE METABOLIC PANEL
ALT: 14 U/L (ref 0–53)
AST: 17 U/L (ref 0–37)
Albumin: 3.7 g/dL (ref 3.5–5.2)
Alkaline Phosphatase: 126 U/L — ABNORMAL HIGH (ref 39–117)
BUN: 6 mg/dL (ref 6–23)
CO2: 27 mEq/L (ref 19–32)
Chloride: 101 mEq/L (ref 96–112)
GFR calc non Af Amer: >90 mL/min (ref >90)
Potassium: 3.3 mEq/L — ABNORMAL LOW (ref 3.5–5.1)
Sodium: 138 mEq/L (ref 135–145)
Total Bilirubin: 0.4 mg/dL (ref 0.3–1.2)
Total Protein: 7.8 g/dL (ref 6.0–8.3)

## 2013-06-04 LAB — POCT I-STAT TROPONIN I

## 2013-06-04 LAB — CBC WITH DIFFERENTIAL/PLATELET
Basophils Absolute: 0 10*3/uL (ref 0.0–0.1)
Basophils Relative: 0 % (ref 0–1)
Eosinophils Absolute: 0.1 10*3/uL (ref 0.0–0.7)
HCT: 45.3 % (ref 39.0–52.0)
Lymphocytes Relative: 50 % — ABNORMAL HIGH (ref 12–46)
MCHC: 35.5 g/dL (ref 30.0–36.0)
Monocytes Absolute: 0.7 10*3/uL (ref 0.1–1.0)
Monocytes Relative: 10 % (ref 3–12)
Neutro Abs: 2.9 10*3/uL (ref 1.7–7.7)
Neutrophils Relative %: 39 % — ABNORMAL LOW (ref 43–77)
Platelets: 231 10*3/uL (ref 150–400)
RDW: 13.5 % (ref 11.5–15.5)
WBC: 7.5 10*3/uL (ref 4.0–10.5)

## 2013-06-04 NOTE — ED Notes (Signed)
[name] generalized chest pain, st's he was here for same on 05/24/13 and dx with GERD.  Pt st's he was given medication for same and has taken it with some relief.

## 2013-06-05 ENCOUNTER — Emergency Department (HOSPITAL_COMMUNITY)
Admission: EM | Admit: 2013-06-05 | Discharge: 2013-06-05 | Discharge status: Against Medical Advice | Payer: Medicare Other | Attending: Emergency Medicine | Admitting: Emergency Medicine

## 2013-06-05 NOTE — ED Notes (Signed)
Name called no answer x 1 

## 2013-06-05 NOTE — ED Notes (Signed)
Called without response 

## 2013-06-05 NOTE — ED Notes (Signed)
Name called x 2, with no answer 

## 2013-06-26 ENCOUNTER — Telehealth: Payer: Self-pay | Admitting: *Deleted

## 2013-06-26 ENCOUNTER — Other Ambulatory Visit: Payer: Self-pay | Admitting: Family Medicine

## 2013-06-26 NOTE — Telephone Encounter (Signed)
Chart reviewed, last visit March 3939   73 year old right-handed [place] American male, fell, with persistent right temporal area headaches, also left arm weakness.  MRI of the brain 12/27/11 with chronic left pontine lacunar ischemic infarction, and chronic SVD. Mild atrophy also noted. MRA of the head normal. MRA of the neck demonstarated right vertebral artery origin is mildly narrowed, may be due to atherosclerosis. Remainder has no stenosis from origin to vertebrobasilar junction. Bil internal carotid arteries have no stenosis, discussed the findings with the patient.   1. Continue Depakote 250 mg every night for headache prevention   2. add on propranolol 40mg  bid.   Please give him a follow up appt with me or Hoyle Sauer if any of [place] has an earlier appt slot.

## 2013-06-30 NOTE — Telephone Encounter (Signed)
Called and left message to call office back for a sooner apt.

## 2013-07-02 NOTE — Telephone Encounter (Signed)
Called and spoke to patient and he has a sooner apt. 07-03-2013

## 2013-07-03 ENCOUNTER — Ambulatory Visit (INDEPENDENT_AMBULATORY_CARE_PROVIDER_SITE_OTHER): Payer: Medicare Other | Admitting: Neurology

## 2013-07-03 ENCOUNTER — Encounter (INDEPENDENT_AMBULATORY_CARE_PROVIDER_SITE_OTHER): Payer: Self-pay

## 2013-07-03 ENCOUNTER — Encounter: Payer: Self-pay | Admitting: Neurology

## 2013-07-03 VITALS — BP 141/70 | HR 62 | Ht 68.75 in | Wt 200.0 lb

## 2013-07-03 DIAGNOSIS — F1011 Alcohol abuse, in remission: Secondary | ICD-10-CM

## 2013-07-03 DIAGNOSIS — R51 Headache: Secondary | ICD-10-CM

## 2013-07-03 MED ORDER — DIVALPROEX SODIUM ER 500 MG PO TB24: 500.0000 mg | ORAL_TABLET | Freq: Every day | ORAL | Status: DC

## 2013-07-03 NOTE — Progress Notes (Signed)
[facility]  PATIENT: Andre [name] DOB: 04-19-1941  Mr. dose Andre [name] returns for follow-up for his headaches. He was initially evaluated in 11/30/11 for headaches with a persistent right temporal headache.  He was drunk, fell in Novemeber of 2012 and had LOC and abrasion to his face on the right,. He has had intermittent headaches since that time lasting 15-20 minutes several times a week. He has been hypertensive for 20 years, is on  B/P meds does not know the name. Also has a hx of GERD and is on meds, does not know the name,    He had a CT scan of the brain in 07/2011, which showed small Left pontine lacunar infarct. MRI of the brain 12/27/11 with chronic left pontine lacunar ischemic infarction, and chronic SVD. Mild atrophy also noted. MRA of the head normal. MRA of the neck demonstarated right vertebral artery origin is mildly narrowed, may be due to atherosclerosis.  Bil internal carotid arteries have no stenosis.    Depakote ER 250mg  qhs has been very effective for his headache prevention, last clinical visit was with Carolyn March 2014, he has run out of medication for about 6 months now, now he has recurrent headaches, mild bilateral frontal pressure headaches,    He denies visual change, no significant gait difficulty   REVIEW OF SYSTEMS: Full 14 system review of systems performed and notable only for headaches, joint pain, chest pain  ALLERGIES: No Known Allergies  HOME MEDICATIONS: Outpatient Prescriptions Prior to Visit  Medication Sig Dispense Refill  . amLODipine (NORVASC) 5 MG tablet Take 5 mg by mouth daily.      Marland Kitchen amoxicillin (AMOXIL) 500 MG capsule Take 500 mg by mouth daily.      Marland Kitchen aspirin EC 81 MG tablet Take 81 mg by mouth every 4 (four) hours as needed for moderate pain.       . clarithromycin (BIAXIN XL) 500 MG 24 hr tablet Take 500 mg by mouth daily.      Marland Kitchen dexlansoprazole (DEXILANT) 60 MG capsule Take 60 mg by mouth daily.      . lansoprazole  (PREVACID) 30 MG capsule Take 30 capsules by mouth daily.      Marland Kitchen lisinopril (PRINIVIL,ZESTRIL) 10 MG tablet Take 1 tablet (10 mg total) by mouth daily.  30 tablet  1  . lubiprostone (AMITIZA) 24 MCG capsule Take 24 mcg by mouth every other day.       . meloxicam (MOBIC) 7.5 MG tablet Take 7.5 mg by mouth 2 (two) times daily.      . methocarbamol (ROBAXIN) 500 MG tablet Take 500 mg by mouth 4 (four) times daily as needed (muscle spasms).      . metoCLOPramide (REGLAN) 10 MG tablet Take 10 mg by mouth 3 (three) times daily as needed (nausea).      Marland Kitchen omeprazole (PRILOSEC) 20 MG capsule 20 capsules daily.      . propranolol (INDERAL) 40 MG tablet Take 40 mg by mouth 2 (two) times daily.      . sucralfate (CARAFATE) 1 G tablet 1 tab 1hr ac and hs  120 tablet  0  . traMADol (ULTRAM) 50 MG tablet Take 1 tablet (50 mg total) by mouth 3 (three) times daily as needed.  30 tablet  0   No facility-administered medications prior to visit.    PAST MEDICAL HISTORY: Past Medical History  Diagnosis Date  . Arthritis   . Hypertension   . Rectal cancer   .  Weakness   . HA (headache)     PAST SURGICAL HISTORY: Past Surgical History  Procedure Laterality Date  . Surgical excision of rectal cancer      FAMILY HISTORY: Family History  Problem Relation Age of Onset  . Hypertension Other     SOCIAL HISTORY:  History   Social History  . Marital Status: Single    Spouse Name: N/A    Number of Children: 61  . Years of Education: high schoo   Occupational History  . Not on file.   Social History Main Topics  . Smoking status: Current Every Day Smoker -- 0.50 packs/day for 47 years    Types: Cigarettes    Last Attempt to Quit: 09/17/2009  . Smokeless tobacco: Never Used  . Alcohol Use: Yes     Comment: heavy drinker, last alcohol was yesterday   . Drug Use: No  . Sexual Activity: Not on file   Other Topics Concern  . Not on file   Social History Narrative   Patient is single, has 11  children   Patient is right handed   Education level is high school   Caffeine consumption is 1 cup daily     PHYSICAL EXAM   Filed Vitals:   07/03/13 1124  BP: 141/70  Pulse: 62  Height: 5' 8.75" (1.746 m)  Weight: 200 lb (90.719 kg)    Not recorded    Body mass index is 29.76 kg/(m^2).   Generalized: In no acute distress  Neck: Supple, no carotid bruits   Cardiac: Regular rate rhythm  Pulmonary: Clear to auscultation bilaterally  Musculoskeletal: No deformity  Neurological examination  Mentation: Alert oriented to time, place, history taking, and causual conversation, depressed looking elderly male   Cranial nerve II-XII: Pupils were equal round reactive to light extraocular movements were full, Visual field were full on confrontational test. Bilateral fundi were sharp.  Facial sensation and strength were normal. Hearing was intact to finger rubbing bilaterally. Uvula tongue midline.  head turning and shoulder shrug and were normal and symmetric.Tongue protrusion into cheek strength was normal.  Motor: normal tone, bulk and strength.  Sensory: Intact to fine touch, pinprick, preserved vibratory sensation, and proprioception at toes.  Coordination: Normal finger to nose, heel-to-shin bilaterally there was no truncal ataxia  Gait: Rising up from seated position without assistance, normal stance, mild difficulty performing tiptoe, heel and tandem walking Romberg signs: Negative  Deep tendon reflexes: Brachioradialis 2/2, biceps 2/2, triceps 2/2, patellar 2/2, Achilles  trace , plantar responses were flexor bilaterally.   DIAGNOSTIC DATA (LABS, IMAGING, TESTING) - I reviewed patient records, labs, notes, testing and imaging myself where available.  Lab Results  Component Value Date   WBC 7.5 06/04/2013   HGB 16.1 06/04/2013   HCT 45.3 06/04/2013   MCV 92.8 06/04/2013   PLT 231 06/04/2013      Component Value Date/Time   NA 138 06/04/2013 2250   K 3.3*  06/04/2013 2250   CL 101 06/04/2013 2250   CO2 27 06/04/2013 2250   GLUCOSE 119* 06/04/2013 2250   BUN 6 06/04/2013 2250   CREATININE 0.69 06/04/2013 2250   CREATININE 0.75 05/09/2013 0853   CALCIUM 9.1 06/04/2013 2250   PROT 7.8 06/04/2013 2250   ALBUMIN 3.7 06/04/2013 2250   AST 17 06/04/2013 2250   ALT 14 06/04/2013 2250   ALKPHOS 126* 06/04/2013 2250   BILITOT 0.4 06/04/2013 2250   GFRNONAA >90 06/04/2013 2250   GFRAA >90 06/04/2013 2250  Lab Results  Component Value Date   CHOL 157 01/06/2013   HDL 45 01/06/2013   LDLCALC 89 01/06/2013   TRIG 117 01/06/2013   CHOLHDL 3.5 01/06/2013   No results found for this basename: HGBA1C   Lab Results  Component Value Date   VITAMINB12 176* 01/06/2013   Lab Results  Component Value Date   TSH 0.800 01/06/2013      ASSESSMENT AND PLAN   73 years old [place] American male, with previous history of head trauma, persistent headaches, did very well with Depakote in the past,  1 refill his Depakote ER 500 milligram every night at headache prevention 2.  RTC with Hoyle Sauer in 6 months     Marcial Pacas, M.D. Ph.D.  [facility] [address] [place], [place] [address] [phone]

## 2013-07-15 ENCOUNTER — Ambulatory Visit: Payer: Medicare Other | Admitting: Neurology

## 2013-07-19 ENCOUNTER — Other Ambulatory Visit: Payer: Self-pay | Admitting: Physician Assistant

## 2013-08-01 ENCOUNTER — Other Ambulatory Visit: Payer: Self-pay | Admitting: Physician Assistant

## 2013-08-10 HISTORY — PX: NM MYOVIEW LTD: HXRAD82

## 2013-08-12 ENCOUNTER — Other Ambulatory Visit: Payer: Self-pay | Admitting: Family Medicine

## 2013-08-21 ENCOUNTER — Emergency Department (HOSPITAL_COMMUNITY): Payer: Medicare Other

## 2013-08-21 ENCOUNTER — Encounter (HOSPITAL_COMMUNITY): Payer: Self-pay | Admitting: Emergency Medicine

## 2013-08-21 ENCOUNTER — Observation Stay (HOSPITAL_COMMUNITY)
Admission: EM | Admit: 2013-08-21 | Discharge: 2013-08-22 | Disposition: A | Discharge status: Home / Self Care | Payer: Medicare Other | Attending: Cardiology | Admitting: Cardiology

## 2013-08-21 DIAGNOSIS — Z85048 Personal history of other malignant neoplasm of rectum, rectosigmoid junction, and anus: Secondary | ICD-10-CM | POA: Insufficient documentation

## 2013-08-21 DIAGNOSIS — I10 Essential (primary) hypertension: Secondary | ICD-10-CM | POA: Diagnosis not present

## 2013-08-21 DIAGNOSIS — M129 Arthropathy, unspecified: Secondary | ICD-10-CM | POA: Insufficient documentation

## 2013-08-21 DIAGNOSIS — Z79899 Other long term (current) drug therapy: Secondary | ICD-10-CM | POA: Insufficient documentation

## 2013-08-21 DIAGNOSIS — F1011 Alcohol abuse, in remission: Secondary | ICD-10-CM | POA: Insufficient documentation

## 2013-08-21 DIAGNOSIS — Z8673 Personal history of transient ischemic attack (TIA), and cerebral infarction without residual deficits: Secondary | ICD-10-CM | POA: Insufficient documentation

## 2013-08-21 DIAGNOSIS — R079 Chest pain, unspecified: Secondary | ICD-10-CM | POA: Diagnosis not present

## 2013-08-21 DIAGNOSIS — K219 Gastro-esophageal reflux disease without esophagitis: Secondary | ICD-10-CM | POA: Insufficient documentation

## 2013-08-21 DIAGNOSIS — I498 Other specified cardiac arrhythmias: Secondary | ICD-10-CM | POA: Insufficient documentation

## 2013-08-21 DIAGNOSIS — Z9889 Other specified postprocedural states: Secondary | ICD-10-CM | POA: Insufficient documentation

## 2013-08-21 DIAGNOSIS — F17200 Nicotine dependence, unspecified, uncomplicated: Secondary | ICD-10-CM | POA: Insufficient documentation

## 2013-08-21 DIAGNOSIS — R51 Headache: Secondary | ICD-10-CM | POA: Insufficient documentation

## 2013-08-21 DIAGNOSIS — R0789 Other chest pain: Principal | ICD-10-CM | POA: Insufficient documentation

## 2013-08-21 DIAGNOSIS — R109 Unspecified abdominal pain: Secondary | ICD-10-CM | POA: Insufficient documentation

## 2013-08-21 HISTORY — DX: Gastro-esophageal reflux disease without esophagitis: K21.9

## 2013-08-21 HISTORY — DX: Transient cerebral ischemic attack, unspecified: G45.9

## 2013-08-21 LAB — BASIC METABOLIC PANEL
BUN: 7 mg/dL (ref 6–23)
CALCIUM: 9.5 mg/dL (ref 8.4–10.5)
CO2: 26 mEq/L (ref 19–32)
Chloride: 98 mEq/L (ref 96–112)
Creatinine, Ser: 0.77 mg/dL (ref 0.50–1.35)
GFR calc Af Amer: >90 mL/min (ref >90)
GFR calc non Af Amer: 89 mL/min — ABNORMAL LOW (ref >90)
Glucose, Bld: 113 mg/dL — ABNORMAL HIGH (ref 70–99)
Potassium: 3.5 mEq/L — ABNORMAL LOW (ref 3.7–5.3)
SODIUM: 138 meq/L (ref 137–147)

## 2013-08-21 LAB — CBC
HCT: 46.1 % (ref 39.0–52.0)
HCT: 44.9 % (ref 39.0–52.0)
HEMOGLOBIN: 15.3 g/dL (ref 13.0–17.0)
Hemoglobin: 15.6 g/dL (ref 13.0–17.0)
MCH: 31.3 pg (ref 26.0–34.0)
MCH: 31.6 pg (ref 26.0–34.0)
MCHC: 33.8 g/dL (ref 30.0–36.0)
MCHC: 34.1 g/dL (ref 30.0–36.0)
MCV: 92.6 fL (ref 78.0–100.0)
MCV: 92.8 fL (ref 78.0–100.0)
PLATELETS: 196 10*3/uL (ref 150–400)
Platelets: 196 10*3/uL (ref 150–400)
RBC: 4.98 MIL/uL (ref 4.22–5.81)
RBC: 4.84 MIL/uL (ref 4.22–5.81)
RDW: 15 % (ref 11.5–15.5)
RDW: 14.9 % (ref 11.5–15.5)
WBC: 6.3 10*3/uL (ref 4.0–10.5)
WBC: 6.3 10*3/uL (ref 4.0–10.5)

## 2013-08-21 LAB — CREATININE, SERUM
Creatinine, Ser: 0.93 mg/dL (ref 0.50–1.35)
GFR calc Af Amer: >90 mL/min (ref >90)
GFR calc non Af Amer: 83 mL/min — ABNORMAL LOW (ref >90)

## 2013-08-21 LAB — TROPONIN I
Troponin I: <0.3 ng/mL (ref <0.30)
Troponin I: <0.3 ng/mL (ref <0.30)

## 2013-08-21 LAB — VALPROIC ACID LEVEL: Valproic Acid Lvl: 27.5 ug/mL — ABNORMAL LOW (ref 50.0–100.0)

## 2013-08-21 MED ORDER — LISINOPRIL 10 MG PO TABS
10.0000 mg | ORAL_TABLET | Freq: Once | ORAL | Status: AC
  Administered 2013-08-21: 10 mg via ORAL
  Filled 2013-08-21: qty 1

## 2013-08-21 MED ORDER — ACETAMINOPHEN 325 MG PO TABS
650.0000 mg | ORAL_TABLET | Freq: Once | ORAL | Status: AC
Start: 2013-08-21 — End: 2013-08-21
  Administered 2013-08-21: 650 mg via ORAL
  Filled 2013-08-21: qty 2

## 2013-08-21 MED ORDER — PANTOPRAZOLE SODIUM 40 MG IV SOLR
40.0000 mg | INTRAVENOUS | Status: AC
  Administered 2013-08-21: 40 mg via INTRAVENOUS
  Filled 2013-08-21: qty 40

## 2013-08-21 MED ORDER — ASPIRIN EC 81 MG PO TBEC
81.0000 mg | DELAYED_RELEASE_TABLET | ORAL | Status: DC | PRN
  Filled 2013-08-21: qty 1

## 2013-08-21 MED ORDER — HEPARIN SODIUM (PORCINE) 5000 UNIT/ML IJ SOLN
5000.0000 [IU] | Freq: Three times a day (TID) | INTRAMUSCULAR | Status: DC
  Filled 2013-08-21 (×6): qty 1

## 2013-08-21 MED ORDER — ASPIRIN 300 MG RE SUPP
300.0000 mg | RECTAL | Status: AC
  Filled 2013-08-21: qty 1

## 2013-08-21 MED ORDER — AMLODIPINE BESYLATE 5 MG PO TABS
5.0000 mg | ORAL_TABLET | Freq: Once | ORAL | Status: AC
  Administered 2013-08-21: 5 mg via ORAL
  Filled 2013-08-21: qty 1

## 2013-08-21 MED ORDER — ASPIRIN EC 81 MG PO TBEC
81.0000 mg | DELAYED_RELEASE_TABLET | Freq: Every day | ORAL | Status: DC
Start: 2013-08-22 — End: 2013-08-22
  Administered 2013-08-22: 81 mg via ORAL
  Filled 2013-08-21: qty 1

## 2013-08-21 MED ORDER — ALUM & MAG HYDROXIDE-SIMETH 200-200-20 MG/5ML PO SUSP
30.0000 mL | ORAL | Status: DC | PRN
  Administered 2013-08-21: 30 mL via ORAL
  Filled 2013-08-21: qty 30

## 2013-08-21 MED ORDER — LISINOPRIL 20 MG PO TABS
20.0000 mg | ORAL_TABLET | Freq: Every day | ORAL | Status: DC
  Administered 2013-08-22: 20 mg via ORAL
  Filled 2013-08-21: qty 1

## 2013-08-21 MED ORDER — DIVALPROEX SODIUM ER 500 MG PO TB24
500.0000 mg | ORAL_TABLET | Freq: Every day | ORAL | Status: DC
  Administered 2013-08-21: 500 mg via ORAL
  Filled 2013-08-21 (×3): qty 1

## 2013-08-21 MED ORDER — GI COCKTAIL ~~LOC~~
30.0000 mL | Freq: Once | ORAL | Status: AC
  Administered 2013-08-21: 30 mL via ORAL
  Filled 2013-08-21: qty 30

## 2013-08-21 MED ORDER — PANTOPRAZOLE SODIUM 40 MG IV SOLR
40.0000 mg | INTRAVENOUS | Status: DC
  Filled 2013-08-21 (×2): qty 40

## 2013-08-21 MED ORDER — KETOROLAC TROMETHAMINE 30 MG/ML IJ SOLN
30.0000 mg | Freq: Once | INTRAMUSCULAR | Status: AC
  Administered 2013-08-21: 30 mg via INTRAVENOUS
  Filled 2013-08-21: qty 1

## 2013-08-21 MED ORDER — AMLODIPINE BESYLATE 10 MG PO TABS
10.0000 mg | ORAL_TABLET | Freq: Every day | ORAL | Status: DC
  Administered 2013-08-21 – 2013-08-22 (×2): 10 mg via ORAL
  Filled 2013-08-21 (×2): qty 1

## 2013-08-21 MED ORDER — AMLODIPINE BESYLATE 10 MG PO TABS: 10.0000 mg | ORAL_TABLET | Freq: Every day | ORAL | Status: DC

## 2013-08-21 MED ORDER — NITROGLYCERIN 0.4 MG SL SUBL: 0.4000 mg | SUBLINGUAL_TABLET | SUBLINGUAL | Status: DC | PRN

## 2013-08-21 MED ORDER — ASPIRIN 81 MG PO CHEW
324.0000 mg | CHEWABLE_TABLET | ORAL | Status: AC
  Administered 2013-08-21: 324 mg via ORAL
  Filled 2013-08-21: qty 4

## 2013-08-21 NOTE — ED Provider Notes (Signed)
Medical screening examination/treatment/procedure(s) were conducted as a shared visit with non-physician practitioner(s) and myself.  I personally evaluated the patient during the encounter.   EKG Interpretation None      Normal sinus rhythm with a rate of 50, T wave inversions noted in the anterior lateral leads V1 through V5 which is new from prior  Patient presents with chest pain.  Patient relates pain to "indigestion." He does have some relation to food. Patient also has a history of coronary artery disease and has new T wave inversions in the anterior lateral leads. His exam is reassuring. Plan to workup including troponin and chest x-ray. Will be given a GI cocktail. Given cardiac risk factors, will discuss with cardiology for disposition pending workup.  Merryl Hacker, MD 08/22/13 [phone]

## 2013-08-21 NOTE — H&P (Addendum)
Physician History and Physical    Andre [name] MRN: [id number] DOB/AGE: November 12, 1940 73 y.o. Admit date: 08/21/2013  Primary Cardiologist: Burt Knack  HPI: 73 yo with history of chronic daily headaches, HTN, smoking, and noncardiac chest pain presented to the ER with chest pain.  He has had episodes of chest pain daily on and off for over a week.  The pain is achy and spreads across both sides of his chest.  It can last minutes to hours.  There is no particular trigger typically.  It does not seem to be exertional.  He decided to come to the ER this morning because he had very severe chest pain.  He says that he ate fried chicken and beans at midnight, and woke up around 4 am with very severe pain across his chest (burning).  He came to the ER and the pain gradually has subsided.  Initial cardiac enzymes negative.  However, he is noted to have new anterior T wave inversions by ECG.    Patient continues to have his chronic headache.  He is seen by neurology and is on Depakote for the headache.  His BP is very high today.  He says that he did take his BP meds before coming to the ER today. He continues to smoke as well. Of note, he had LHC for chest pain in 1/13.  This showed no significant cardiac disease.   Review of systems complete and found to be negative unless listed above   PMH: 1. Chronic daily headaches 2. Active smoker 3. H/o rectal cancer 4. OA 5. Chest pain: LHC in 1/13 with no significant CAD.   Family History  Problem Relation Age of Onset  . Hypertension Other     History   Social History  . Marital Status: Single    Spouse Name: N/A    Number of Children: 40  . Years of Education: high schoo   Occupational History  . Not on file.   Social History Main Topics  . Smoking status: Current Every Day Smoker -- 0.50 packs/day for 47 years    Types: Cigarettes    Last Attempt to Quit: 09/17/2009  . Smokeless tobacco: Never Used  . Alcohol Use: Yes     Comment: heavy  drinker, last alcohol was yesterday   . Drug Use: No  . Sexual Activity: Not on file   Other Topics Concern  . Not on file   Social History Narrative   Patient is single, has 11 children   Patient is right handed   Education level is high school   Caffeine consumption is 1 cup daily    Physical Exam: Blood pressure 185/96, pulse 48, temperature 98 F (36.7 C), temperature source Oral, resp. rate 18, height 5\' 9"  (1.753 m), weight 86.183 kg (190 lb), SpO2 100.00%.  General: NAD Neck: No JVD, no thyromegaly or thyroid nodule.  Lungs: Clear to auscultation bilaterally with normal respiratory effort. CV: Nondisplaced PMI.  Heart regular S1/S2, soft S4, no murmur.  No peripheral edema.  No carotid bruit.  Normal pedal pulses.  Abdomen: Soft, nontender, no hepatosplenomegaly, no distention.  Skin: Intact without lesions or rashes.  Neurologic: Alert and oriented x 3.  Psych: Normal affect. Extremities: No clubbing or cyanosis.  HEENT: Normal.   Labs:   Lab Results  Component Value Date   WBC 6.3 08/21/2013   HGB 15.6 08/21/2013   HCT 46.1 08/21/2013   MCV 92.6 08/21/2013   PLT 196 08/21/2013  Recent Labs Lab 08/21/13 0634  NA 138  K 3.5*  CL 98  CO2 26  BUN 7  CREATININE 0.77  CALCIUM 9.5  GLUCOSE 113*   Lab Results  Component Value Date   CKTOTAL 225 05/15/2011   CKMB 3.4 05/15/2011   TROPONINI <0.30 08/21/2013     Radiology:  - CXR: Clear  EKG: NSR with borderline LVH and anterior TW inversions and inferior TW flattening, not seen on prior ECG  ASSESSMENT AND PLAN:  73 yo with history of chronic daily headaches, HTN, smoking, and noncardiac chest pain presented to the ER with chest pain. 1. Chest pain: Patient has a history of noncardiac chest pain with LHC showing no significant CAD in 1/13.  His cardiac enzymes so far are negative.  He has had chest pain (atypical) on and off for > 1 week but had severe episode early this morning after eating fried chicken that  could be GERD.  His initial cardiac enzymes were normal.  However, his ECG shows significant changes from recent prior with anterior T wave inversions.  It is possible this is related to LVH with repolarization abnormality, but as mentioned, it is a clear change.  - I will keep him in observation overnight.  Rule out MI with cardiac enzymes.  If he rules out, plan Lexiscan Cardiolite in the morning.  - Check lipids in the am.  - Will start Protonix in case this is GERD.  2. HTN: BP is running high despite taking his meds.  I will increase amlodipine to 10 and lisinopril to 20 daily and give an extra dose today.  3. Headache: Chronic, unchanged.  4. Smoking: I counseled him to quit.   Signed: Loralie Champagne 08/21/2013, 1:41 PM

## 2013-08-21 NOTE — ED Provider Notes (Signed)
CSN: [id number]     Arrival date & time 08/21/13  4174 History   First MD Initiated Contact with Patient 08/21/13 [phone]     Chief Complaint  Patient presents with  . Chest Pain     (Consider location/radiation/quality/duration/timing/severity/associated sxs/prior Treatment) HPI Comments: Andre [name] is a 73 y.o. male with a past medical history of presenting the Emergency Department with a chief complaint of Chest discomfort for 5 days, increase since 0400 today.  The patient reports intermittent chest discomfort, lasting approximately 10 minutes then switching laterality. He describes the discomfort as "reflux" and burning. He denies radiation into the back or jaw.  Denies palpitations, SOB, or lower extremity swelling.  The patient reports discomfort worsening by food.  States he ate fried chicken last night which worsened the discomfort. He also complains of ongoing headache, currently being evaluated by neurology.  He reports bilateral temporal headache on going for several months.  Reports taking his medication last night. PCP: Ruben Reason, MD    Patient is a 73 y.o. male presenting with chest pain. The history is provided by the patient and medical records.  Chest Pain Associated symptoms: no abdominal pain, no cough, no diaphoresis, no fever, no nausea, no palpitations, no shortness of breath and not vomiting     Past Medical History  Diagnosis Date  . Arthritis   . Hypertension   . Rectal cancer   . Weakness   . HA (headache)   . TIA (transient ischemic attack)   . Chest pain   . GERD (gastroesophageal reflux disease)    Past Surgical History  Procedure Laterality Date  . Surgical excision of rectal cancer     Family History  Problem Relation Age of Onset  . Hypertension Other    History  Substance Use Topics  . Smoking status: Current Some Day Smoker -- 0.50 packs/day for 47 years    Types: Cigarettes  . Smokeless tobacco: Never Used  . Alcohol Use: Yes   Comment: heavy drinker, last alcohol was yesterday     Review of Systems  Constitutional: Negative for fever, chills and diaphoresis.  Respiratory: Negative for cough and shortness of breath.   Cardiovascular: Positive for chest pain. Negative for palpitations and leg swelling.  Gastrointestinal: Negative for nausea, vomiting, abdominal pain, diarrhea, constipation and blood in stool.      Allergies  Review of patient's allergies indicates no known allergies.  Home Medications   No current outpatient prescriptions on file. BP 174/85  Pulse 50  Temp(Src) 98.2 F (36.8 C) (Oral)  Resp 18  Ht 5\' 9"  (1.753 m)  Wt 190 lb (86.183 kg)  BMI 28.05 kg/m2  SpO2 100% Physical Exam  Nursing note and vitals reviewed. Constitutional: He is oriented to person, place, and time. He appears well-developed and well-nourished. No distress.  HENT:  Head: Normocephalic and atraumatic.  Eyes: EOM are normal. Pupils are equal, round, and reactive to light. No scleral icterus.  Neck: Neck supple.  Cardiovascular: Regular rhythm and normal heart sounds.  Bradycardia present.   No murmur heard. Pulses:      Radial pulses are 2+ on the right side, and 2+ on the left side.  No lower extremity edema  Pulmonary/Chest: Effort normal and breath sounds normal. He has no wheezes. He has no rales. He exhibits no tenderness.  Abdominal: Soft. Bowel sounds are normal. There is no tenderness. There is no rebound and no guarding.  Musculoskeletal: Normal range of motion. He exhibits no edema.  Neurological:  He is alert and oriented to person, place, and time.  Skin: Skin is warm and dry. No rash noted. He is not diaphoretic.  Psychiatric: He has a normal mood and affect. His behavior is normal.    ED Course  Procedures (including critical care time) Labs Review Labs Reviewed  BASIC METABOLIC PANEL - Abnormal; Notable for the following:    Potassium 3.5 (*)    Glucose, Bld 113 (*)    GFR calc non Af Amer  89 (*)    All other components within normal limits  VALPROIC ACID LEVEL - Abnormal; Notable for the following:    Valproic Acid Lvl 27.5 (*)    All other components within normal limits  CBC  TROPONIN I  TROPONIN I  TROPONIN I  TROPONIN I  TROPONIN I  TSH  CBC  CREATININE, SERUM   Imaging Review Dg Chest 2 View  08/21/2013   CLINICAL DATA Chest pain, dyspnea, cough.  EXAM CHEST  2 VIEW  COMPARISON Prior radiograph from 05/24/2013  FINDINGS Mild cardiomegaly is stable as compared to prior study. Tortuosity of the intrathoracic aorta again noted.  The lungs are normally inflated. No airspace consolidation, pleural effusion, or pulmonary edema is identified. There is no pneumothorax.  No acute osseous abnormality identified.  IMPRESSION No acute cardiopulmonary process identified.  SIGNATURE  Electronically Signed   By: Jeannine Boga M.D.   On: 08/21/2013 06:57     Date: 08/21/2013  Rate: 50   Rhythm: sinus bradycardia  QRS Axis: normal  Intervals: normal  ST/T Wave abnormalities: nonspecific ST/T changes  Conduction Disutrbances:none  Narrative Interpretation:   Old EKG Reviewed: changes noted ST&T wave abnormality, consider anterolateral ischemia.  Minimal voltage criteria for LVH, may ne normal variant    MDM   Final diagnoses:  Hypertension  Chest pain   Pt complains of CP intermittent and bilateral, new T wave inversions in V1-V5.  He also complains of a headache, currently being evaluated by Marcial Pacas, M.D. (neurology) as an out-pt, see notes from 07/03/2013. EMR shows minimal CAD on heart cath 07/10/2011.  Discussed patient history and condition with Dr. Dina Rich.  After her evaluation of the patient advises discomfort more gastric in nature delta troponin and consult to cardiology for recommendations 0745 Pt reports chest discomfort and epigastric discomfort has subsided.  Complains of headache, tylenol for headache.  Awaiting second troponin before giving an NSAID  at this time. EMR shows pt is on depakote for ongoing headache. Depakote level, low.  Troponin x2 negative. [place] to Cardiology.  They will come to evaluated in the ED. 1245 Re-paged cardiology  Cardiologist to admit the patient for observation. Cardiology to admit the pateint.  Meds given in ED:  Medications  lisinopril (PRINIVIL,ZESTRIL) tablet 20 mg (not administered)  divalproex (DEPAKOTE ER) 24 hr tablet 500 mg (not administered)  amLODipine (NORVASC) tablet 10 mg (not administered)  aspirin EC tablet 81 mg (not administered)  amLODipine (NORVASC) tablet 5 mg (not administered)  lisinopril (PRINIVIL,ZESTRIL) tablet 10 mg (not administered)  aspirin chewable tablet 324 mg (not administered)    Or  aspirin suppository 300 mg (not administered)  aspirin EC tablet 81 mg (not administered)  nitroGLYCERIN (NITROSTAT) SL tablet 0.4 mg (not administered)  heparin injection 5,000 Units (not administered)  pantoprazole (PROTONIX) injection 40 mg (not administered)  gi cocktail (Maalox,Lidocaine,Donnatal) (30 mLs Oral Given 08/21/13 0723)  pantoprazole (PROTONIX) injection 40 mg (40 mg Intravenous Given 08/21/13 0723)  acetaminophen (TYLENOL) tablet 650 mg (  650 mg Oral Given 08/21/13 0826)  ketorolac (TORADOL) 30 MG/ML injection 30 mg (30 mg Intravenous Given 08/21/13 1048)    Current Discharge Medication List          Lorrine Kin, PA-C 08/21/13 1652

## 2013-08-21 NOTE — ED Notes (Signed)
Patient transported to X-ray 

## 2013-08-21 NOTE — ED Notes (Signed)
Offered pt a blanket and water, he stated that he didn't need them and was fine, pt was sleeping when I entered the room. No acute distress noted.

## 2013-08-21 NOTE — ED Notes (Signed)
[name] CP and HA

## 2013-08-21 NOTE — ED Notes (Signed)
Pt reports CP x 5 days worsening last night. Pt reports that the pain feels like acid reflux. Pt also reports H/A

## 2013-08-22 ENCOUNTER — Observation Stay (HOSPITAL_COMMUNITY): Payer: Medicare Other

## 2013-08-22 ENCOUNTER — Encounter (HOSPITAL_COMMUNITY): Payer: Medicare Other

## 2013-08-22 DIAGNOSIS — R109 Unspecified abdominal pain: Secondary | ICD-10-CM

## 2013-08-22 DIAGNOSIS — R079 Chest pain, unspecified: Secondary | ICD-10-CM | POA: Diagnosis not present

## 2013-08-22 DIAGNOSIS — F1011 Alcohol abuse, in remission: Secondary | ICD-10-CM

## 2013-08-22 LAB — LIPID PANEL
Cholesterol: 143 mg/dL (ref 0–200)
HDL: 37 mg/dL — ABNORMAL LOW (ref >39)
LDL CALC: 83 mg/dL (ref 0–99)
TRIGLYCERIDES: 114 mg/dL (ref <150)
Total CHOL/HDL Ratio: 3.9 RATIO
VLDL: 23 mg/dL (ref 0–40)

## 2013-08-22 LAB — BASIC METABOLIC PANEL
BUN: 6 mg/dL (ref 6–23)
CO2: 27 meq/L (ref 19–32)
CREATININE: 0.8 mg/dL (ref 0.50–1.35)
Calcium: 8.8 mg/dL (ref 8.4–10.5)
Chloride: 98 mEq/L (ref 96–112)
GFR calc non Af Amer: 88 mL/min — ABNORMAL LOW (ref >90)
Glucose, Bld: 103 mg/dL — ABNORMAL HIGH (ref 70–99)
Potassium: 3.6 mEq/L — ABNORMAL LOW (ref 3.7–5.3)
Sodium: 138 mEq/L (ref 137–147)

## 2013-08-22 LAB — TROPONIN I: Troponin I: <0.3 ng/mL (ref <0.30)

## 2013-08-22 LAB — TSH: TSH: 1.286 u[IU]/mL (ref 0.350–4.500)

## 2013-08-22 MED ORDER — LISINOPRIL 20 MG PO TABS: 20.0000 mg | ORAL_TABLET | Freq: Every day | ORAL | Status: DC

## 2013-08-22 MED ORDER — REGADENOSON 0.4 MG/5ML IV SOLN
INTRAVENOUS | Status: AC
  Filled 2013-08-22: qty 5

## 2013-08-22 MED ORDER — AMLODIPINE BESYLATE 10 MG PO TABS: 10.0000 mg | ORAL_TABLET | Freq: Every day | ORAL | Status: DC

## 2013-08-22 MED ORDER — TECHNETIUM TC 99M SESTAMIBI GENERIC - CARDIOLITE
10.0000 | Freq: Once | INTRAVENOUS | Status: AC | PRN
  Administered 2013-08-22: 10 via INTRAVENOUS

## 2013-08-22 MED ORDER — REGADENOSON 0.4 MG/5ML IV SOLN
0.4000 mg | Freq: Once | INTRAVENOUS | Status: AC
  Administered 2013-08-22: 0.4 mg via INTRAVENOUS
  Filled 2013-08-22: qty 5

## 2013-08-22 MED ORDER — ACETAMINOPHEN 325 MG PO TABS
650.0000 mg | ORAL_TABLET | Freq: Four times a day (QID) | ORAL | Status: DC | PRN
  Administered 2013-08-22 (×2): 650 mg via ORAL
  Filled 2013-08-22 (×2): qty 2

## 2013-08-22 MED ORDER — TECHNETIUM TC 99M SESTAMIBI GENERIC - CARDIOLITE
30.0000 | Freq: Once | INTRAVENOUS | Status: AC | PRN
  Administered 2013-08-22: 30 via INTRAVENOUS

## 2013-08-22 MED ORDER — OMEPRAZOLE 20 MG PO CPDR: 20.0000 mg | DELAYED_RELEASE_CAPSULE | Freq: Every day | ORAL | Status: DC

## 2013-08-22 MED ORDER — POTASSIUM CHLORIDE CRYS ER 20 MEQ PO TBCR
20.0000 meq | EXTENDED_RELEASE_TABLET | Freq: Once | ORAL | Status: AC
  Administered 2013-08-22: 20 meq via ORAL
  Filled 2013-08-22: qty 1

## 2013-08-22 MED ORDER — PANTOPRAZOLE SODIUM 40 MG PO TBEC
40.0000 mg | DELAYED_RELEASE_TABLET | Freq: Every day | ORAL | Status: DC
  Administered 2013-08-22: 40 mg via ORAL
  Filled 2013-08-22: qty 1

## 2013-08-22 NOTE — Discharge Instructions (Signed)
Chest Pain (Nonspecific) °Chest pain has many causes. Your pain could be caused by something serious, such as a heart attack or a blood clot in the lungs. It could also be caused by something less serious, such as a chest bruise or a virus. Follow up with your doctor. More lab tests or other studies may be needed to find the cause of your pain. Most of the time, nonspecific chest pain will improve within 2 to 3 days of rest and mild pain medicine. °HOME CARE °· For chest bruises, you may put ice on the sore area for 15-20 minutes, 03-04 times a day. Do this only if it makes you feel better. °· Put ice in a plastic bag. °· Place a towel between the skin and the bag. °· Rest for the next 2 to 3 days. °· Go back to work if the pain improves. °· See your doctor if the pain lasts longer than 1 to 2 weeks. °· Only take medicine as told by your doctor. °· Quit smoking if you smoke. °GET HELP RIGHT AWAY IF:  °· There is more pain or pain that spreads to the arm, neck, jaw, back, or belly (abdomen). °· You have shortness of breath. °· You cough more than usual or cough up blood. °· You have very bad back or belly pain, feel sick to your stomach (nauseous), or throw up (vomit). °· You have very bad weakness. °· You pass out (faint). °· You have a fever. °Any of these problems may be serious and may be an emergency. Do not wait to see if the problems will go away. Get medical help right away. Call your local emergency services 911 in [place] Do not drive yourself to the hospital. °MAKE SURE YOU:  °· Understand these instructions. °· Will watch this condition. °· Will get help right away if you or your child is not doing well or gets worse. °Document Released: 11/15/2007 Document Revised: 08/21/2011 Document Reviewed: 11/15/2007 °[employer]® Patient Information ©2014 [employer]. ° °

## 2013-08-22 NOTE — ED Provider Notes (Signed)
Medical screening examination/treatment/procedure(s) were conducted as a shared visit with non-physician practitioner(s) and myself.  I personally evaluated the patient during the encounter.   EKG Interpretation None      See separate note  Merryl Hacker, MD 08/22/13 [phone]

## 2013-08-22 NOTE — Progress Notes (Signed)
UR completed 

## 2013-08-22 NOTE — Discharge Summary (Signed)
Discharge Summary   Patient ID: Andre [name] MRN: [id number], DOB/AGE: 72/07/1940 72 y.o. Admit date: 08/21/2013 D/C date:     08/22/2013  Primary Cardiologist: Dr. Burt Knack   This discharge summary was authored by Lorretta Harp PA-C. It was refreshed by Melina Copa PA-C once the MD's discharge medication regimen was established.   Active Problems:   Chest pain  Admission Dates: 08/21/13-08/22/13 Discharge Diagnosis: chest pain, non cardiac- likely GI related.   HPI: 73 yo with history of chronic daily headaches, HTN, ongoing tobacco abuse, and noncardiac chest pain followed by Dr. Burt Knack who was admitted to [facility] hospital on 08/21/13 for chest pain. He has had episodes of chest pain daily on and off for over a week. The pain is achy and spreads across both sides of his chest and lasts minutes to hours. It does not seem to be exertional. It became more severe and "burning" yesterday around 4am after a dinner of fried chicken and beans at midnight. , He had a LHC for chest pain in 1/13 which showed no significant cardiac disease.   Hospital Course: He presented to the ED and the pain gradually subsided. He was noted to have new anterior T wave inversions by ECG, but ruled out with negative serial enzymes. With new ECG changes is was elected to proceed with Lexiscan myvoue the next morning, which returned normal. His BP was noted to be elevated despite taking his medications, so his amlodipine was increased to 10 and lisinopril to 20 daily and he was given an extra dose on 08/22/13 which did seem to help. In the setting of a normal stress test, negative cardiac markers, and atypical chest pain worse after a late meal of fried chicken and beans, it is most likely related to indigestion and he was started on PPI for GERD. Of note, the patient continued to have his chronic headache. He is seen by Dr. Krista Blue with neurology and is on Depakote for the headache; the levels for Depakote were noted to be a little low. He  will follow up with neurology, hopefully next week .   The patient has had an uncomplicated hospital course and is recovering well. He has been seen by Dr. Harrington Challenger today and deemed stable for discharge home. He has been instructed to follow up with his PCP and neurology as soon as possible next week for medication adjustment. Smoking cessation was disscussed in length.   Discharge Vitals: Blood pressure 139/72, pulse 75, temperature 97.6 F (36.4 C), temperature source Oral, resp. rate 16, height 5\' 9"  (1.753 m), weight 190 lb (86.183 kg), SpO2 100.00%.  Labs: Lab Results  Component Value Date   WBC 6.3 08/21/2013   HGB 15.3 08/21/2013   HCT 44.9 08/21/2013   MCV 92.8 08/21/2013   PLT 196 08/21/2013     Recent Labs Lab 08/22/13 0410  NA 138  K 3.6*  CL 98  CO2 27  BUN 6  CREATININE 0.80  CALCIUM 8.8  GLUCOSE 103*    Recent Labs  08/21/13 0908 08/21/13 1918 08/21/13 2238 08/22/13 0410  TROPONINI <0.30 <0.30 <0.30 <0.30   Lab Results  Component Value Date   CHOL 143 08/22/2013   HDL 37* 08/22/2013   LDLCALC 83 08/22/2013   TRIG 114 08/22/2013     Diagnostic Studies/Procedures   Dg Chest 2 View  08/21/2013   CLINICAL DATA Chest pain, dyspnea, cough.  EXAM CHEST  2 VIEW  COMPARISON Prior radiograph from 05/24/2013  FINDINGS Mild cardiomegaly is  stable as compared to prior study. Tortuosity of the intrathoracic aorta again noted.  The lungs are normally inflated. No airspace consolidation, pleural effusion, or pulmonary edema is identified. There is no pneumothorax.  No acute osseous abnormality identified.  IMPRESSION No acute cardiopulmonary process identified.    Nuc stress test - official read pending, per verbal discussion with Dr. Meda Coffee, was normal. See epic for report.  Discharge Medications     Medication List         amLODipine 10 MG tablet  Commonly known as:  NORVASC  Take 1 tablet (10 mg total) by mouth daily.     aspirin EC 81 MG tablet  Take 81 mg by  mouth every 4 (four) hours as needed for moderate pain.     divalproex 500 MG 24 hr tablet  Commonly known as:  DEPAKOTE ER  Take 1 tablet (500 mg total) by mouth at bedtime.     lisinopril 20 MG tablet  Commonly known as:  PRINIVIL,ZESTRIL  Take 1 tablet (20 mg total) by mouth daily.     omeprazole 20 MG capsule  Commonly known as:  PRILOSEC  Take 1 capsule (20 mg total) by mouth daily.        Disposition   The patient will be discharged in stable condition to home. Discharge Orders   Future Appointments Provider Department Dept Phone   12/31/2013 9:30 AM Dennie Bible, NP [facility] [phone]   Future Orders Complete By Expires   Diet - low sodium heart healthy  As directed    Increase activity slowly  As directed      Follow-up Information   Follow up with Robyn Haber, MD. (please follow up with your family Dr ASAP)    Specialty:  [facility] Medicine   Contact information:   [place] [address] [phone]       Follow up with Marcial Pacas, MD On 08/22/2013. (Please follow up about your headaches and Depakote management. Please call them to get in next week. )    Specialty:  Neurology   Contact information:   [place] [address] [phone]         Duration of Discharge Encounter: Greater than 30 minutes including physician and PA time.  Signed, Lorretta Harp PA-C  Addended 5:41 PM to include refresh for discharge med list. Melina Copa PA-C  Attending Note:   The patient was seen and examined.  Agree with assessment and plan as noted above.  Changes made to the above note as needed.  Myoview study is normal.  OK to go home. Follow up as above.   Thayer Headings, Brooke Bonito., MD, [facility] 08/22/2013, 7:03 PM

## 2013-08-22 NOTE — Progress Notes (Signed)
Patient: Andre [name] / Admit Date: 08/21/2013 / Date of Encounter: 08/22/2013, 11:57 AM  Subjective  Intermittent CP x 1 week. No SOB. Tolerated Lexiscan nuc well. - Objective   Telemetry: MD to review when patient seen back on floor  Physical Exam: Blood pressure 152/84, pulse 54, temperature 97.7 F (36.5 C), temperature source Oral, resp. rate 18, height 5\' 9"  (1.753 m), weight 190 lb (86.183 kg), SpO2 98.00%. General: Well developed, well nourished AAM in no acute distress. Head: Normocephalic, atraumatic, sclera non-icteric, no xanthomas, nares are without discharge. Neck: JVP not elevated. Lungs: Clear bilaterally to auscultation without wheezes, rales, or rhonchi. Breathing is unlabored. Heart: RRR S1 S2 without murmurs, rubs, or gallops.  Abdomen: Soft, non-tender, non-distended with normoactive bowel sounds. No rebound/guarding. Extremities: No clubbing or cyanosis. No edema. Distal pedal pulses are 2+ and equal bilaterally. Neuro: Alert and oriented X 3. Moves all extremities spontaneously. Psych:  Responds to questions appropriately with a normal affect.  No intake or output data in the 24 hours ending 08/22/13 1157  Inpatient Medications:  . amLODipine  10 mg Oral Daily  . aspirin EC  81 mg Oral Daily  . divalproex  500 mg Oral QHS  . heparin  5,000 Units Subcutaneous 3 times per day  . lisinopril  20 mg Oral Daily  . pantoprazole (PROTONIX) IV  40 mg Intravenous Q24H  . regadenoson       Infusions:    Labs:  Recent Labs  08/21/13 0634 08/21/13 1918 08/22/13 0410  NA 138  --  138  K 3.5*  --  3.6*  CL 98  --  98  CO2 26  --  27  GLUCOSE 113*  --  103*  BUN 7  --  6  CREATININE 0.77 0.93 0.80  CALCIUM 9.5  --  8.8   No results found for this basename: AST, ALT, ALKPHOS, BILITOT, PROT, ALBUMIN,  in the last 72 hours  Recent Labs  08/21/13 0634 08/21/13 1918  WBC 6.3 6.3  HGB 15.6 15.3  HCT 46.1 44.9  MCV 92.6 92.8  PLT 196 196    Recent  Labs  08/21/13 0908 08/21/13 1918 08/21/13 2238 08/22/13 0410  TROPONINI <0.30 <0.30 <0.30 <0.30   Radiology/Studies:  Dg Chest 2 View  08/21/2013   CLINICAL DATA Chest pain, dyspnea, cough.  EXAM CHEST  2 VIEW  COMPARISON Prior radiograph from 05/24/2013  FINDINGS Mild cardiomegaly is stable as compared to prior study. Tortuosity of the intrathoracic aorta again noted.  The lungs are normally inflated. No airspace consolidation, pleural effusion, or pulmonary edema is identified. There is no pneumothorax.  No acute osseous abnormality identified.  IMPRESSION No acute cardiopulmonary process identified.  SIGNATURE  Electronically Signed   By: Andre [name] M.D.   On: 08/21/2013 06:57     Assessment and Plan  1. Chest pain, atypical 2. Chronic headaches, on Depakote 3. HTN 4. Tobacco abuse  BP running higher this AM but meds just increased yesterday. Follow. Await nuc results. If negative, continue PPI and f/u PCP. If abnormal may need cath. MD to review telemetry when patient gets back to floor (seen by PA in nuc dept).  Signed, Andre Copa PA-C   Attending Note:   The patient was seen and examined.  Agree with assessment and plan as noted above.  Changes made to the above note as needed.  Pt is doing well.  Should be able to go home if Andre [name] is negative   Andre [name]  Andre [name]., MD, [facility] 08/22/2013, 7:02 PM

## 2013-08-25 ENCOUNTER — Encounter (INDEPENDENT_AMBULATORY_CARE_PROVIDER_SITE_OTHER): Payer: Self-pay

## 2013-08-25 ENCOUNTER — Ambulatory Visit (INDEPENDENT_AMBULATORY_CARE_PROVIDER_SITE_OTHER): Payer: Medicare Other | Admitting: Neurology

## 2013-08-25 ENCOUNTER — Telehealth: Payer: Self-pay | Admitting: *Deleted

## 2013-08-25 ENCOUNTER — Encounter: Payer: Self-pay | Admitting: Neurology

## 2013-08-25 VITALS — BP 121/74 | HR 73 | Ht 68.0 in | Wt 204.0 lb

## 2013-08-25 DIAGNOSIS — R52 Pain, unspecified: Secondary | ICD-10-CM | POA: Diagnosis not present

## 2013-08-25 DIAGNOSIS — R51 Headache: Secondary | ICD-10-CM

## 2013-08-25 DIAGNOSIS — Z8673 Personal history of transient ischemic attack (TIA), and cerebral infarction without residual deficits: Secondary | ICD-10-CM

## 2013-08-25 MED ORDER — NORTRIPTYLINE HCL 25 MG PO CAPS: 25.0000 mg | ORAL_CAPSULE | Freq: Every day | ORAL | Status: DC
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
## 2013-08-25 NOTE — Telephone Encounter (Signed)
Spoke with patient and he has sched/confirmed sooner appt with Dr Pamelia Hoit f/u

## 2013-08-26 LAB — THYROID PANEL WITH TSH
FREE THYROXINE INDEX: 2.1 (ref 1.2–4.9)
T3 UPTAKE RATIO: 32 % (ref 24–39)
T4 TOTAL: 6.5 ug/dL (ref 4.5–12.0)
TSH: 1.13 u[IU]/mL (ref 0.450–4.500)

## 2013-08-26 LAB — C-REACTIVE PROTEIN: CRP: 3.2 mg/L (ref 0.0–4.9)

## 2013-08-26 LAB — SEDIMENTATION RATE: Sed Rate: 7 mm/hr (ref 0–30)

## 2013-08-27 NOTE — Progress Notes (Signed)
Quick Note:  Called to share normal labs, patient verbalized understanding ______

## 2013-08-28 ENCOUNTER — Encounter: Payer: Self-pay | Admitting: Family Medicine

## 2013-08-28 ENCOUNTER — Ambulatory Visit (INDEPENDENT_AMBULATORY_CARE_PROVIDER_SITE_OTHER): Payer: Medicare Other | Admitting: Family Medicine

## 2013-08-28 VITALS — BP 118/70 | HR 74 | Temp 98.0°F | Resp 16 | Ht 69.5 in | Wt 204.2 lb

## 2013-08-28 DIAGNOSIS — I10 Essential (primary) hypertension: Secondary | ICD-10-CM

## 2013-08-28 DIAGNOSIS — R7309 Other abnormal glucose: Secondary | ICD-10-CM

## 2013-08-28 DIAGNOSIS — R51 Headache: Secondary | ICD-10-CM | POA: Diagnosis not present

## 2013-08-28 DIAGNOSIS — R739 Hyperglycemia, unspecified: Secondary | ICD-10-CM

## 2013-08-28 DIAGNOSIS — R519 Headache, unspecified: Secondary | ICD-10-CM

## 2013-08-28 LAB — CBC WITH DIFFERENTIAL/PLATELET
Basophils Absolute: 0 10*3/uL (ref 0.0–0.1)
Basophils Relative: 0 % (ref 0–1)
Eosinophils Absolute: 0.1 10*3/uL (ref 0.0–0.7)
Eosinophils Relative: 2 % (ref 0–5)
HCT: 47.4 % (ref 39.0–52.0)
Hemoglobin: 16.3 g/dL (ref 13.0–17.0)
Lymphocytes Relative: 51 % — ABNORMAL HIGH (ref 12–46)
Lymphs Abs: 3.1 10*3/uL (ref 0.7–4.0)
MCH: 30.6 pg (ref 26.0–34.0)
MCHC: 34.4 g/dL (ref 30.0–36.0)
MCV: 89.1 fL (ref 78.0–100.0)
Monocytes Absolute: 0.6 10*3/uL (ref 0.1–1.0)
Monocytes Relative: 10 % (ref 3–12)
Neutro Abs: 2.3 10*3/uL (ref 1.7–7.7)
Neutrophils Relative %: 37 % — ABNORMAL LOW (ref 43–77)
Platelets: 181 10*3/uL (ref 150–400)
RBC: 5.32 MIL/uL (ref 4.22–5.81)
RDW: 15 % (ref 11.5–15.5)
WBC: 6.1 10*3/uL (ref 4.0–10.5)

## 2013-08-28 LAB — COMPLETE METABOLIC PANEL WITH GFR
ALT: 18 U/L (ref 0–53)
AST: 22 U/L (ref 0–37)
Albumin: 4.2 g/dL (ref 3.5–5.2)
Alkaline Phosphatase: 120 U/L — ABNORMAL HIGH (ref 39–117)
BUN: 4 mg/dL — ABNORMAL LOW (ref 6–23)
CO2: 30 mEq/L (ref 19–32)
Calcium: 8.9 mg/dL (ref 8.4–10.5)
Chloride: 101 mEq/L (ref 96–112)
Creat: 0.7 mg/dL (ref 0.50–1.35)
GFR, Est African American: >89 mL/min
GFR, Est Non African American: >89 mL/min
Glucose, Bld: 91 mg/dL (ref 70–99)
Potassium: 3.8 mEq/L (ref 3.5–5.3)
Sodium: 139 mEq/L (ref 135–145)
Total Bilirubin: 0.9 mg/dL (ref 0.2–1.2)
Total Protein: 7.2 g/dL (ref 6.0–8.3)

## 2013-08-28 LAB — POCT GLYCOSYLATED HEMOGLOBIN (HGB A1C): Hemoglobin A1C: 6.3

## 2013-08-28 LAB — POCT SEDIMENTATION RATE: POCT SED RATE: 3 mm/hr (ref 0–22)

## 2013-08-28 NOTE — Progress Notes (Signed)
Subjective:  This chart was scribed for Robyn Haber, MD by Donato Schultz, Medical Scribe. This patient was seen in Room 25 and the patient's care was started at 11:44 AM.   Patient ID: Andre [name], male    DOB: [birth year], 73 y.o.   MRN: [id number]  HPI HPI Comments: Andre [name] is a 73 y.o. male who presents to the [facility] for a follow-up after being hospitalized for chest pain and headache.  The patient states that he had an x-ray of his heart that did not reveal any significant findings.  He states that he was in the hospital from Thursday morning to Friday afternoon.  The patient states that he is still experiencing intermittent left-sided headaches but they are becoming increasingly steady.  He states that he was prescribed Depakote and advised to take Aspirin to treat his headaches.  The patient denies abdominal pain and heartburn as associated symptoms.  The patient states that he has an appointment for a scan of his head tomorrow morning.  The patient states that he has very poor vision and needs reading glasses to see.  He denies using any eye drops regularly.  He states that his optometrist is Dr. Katy Fitch.  The patient denies having a history of hyperglycemia.  The patient states that he does yard work and walks to keep busy.  The patient states that he can do those activities with no problems.  He states that he is separated from his wife and lives alone.  The patient states that his children will visit regularly and he will visit with friends.  He states that he goes to the club to dance sometimes.  Past Medical History  Diagnosis Date   Arthritis    Hypertension    Rectal cancer    Weakness    HA (headache)    TIA (transient ischemic attack)    Chest pain    GERD (gastroesophageal reflux disease)    Past Surgical History  Procedure Laterality Date   Surgical excision of rectal cancer     Family History  Problem Relation Age of Onset    Hypertension Mother    Cancer Mother    History   Social History   Marital Status: Single    Spouse Name: N/A    Number of Children: 66   Years of Education: 71   Occupational History        retired   Social History Main Topics   Smoking status: Current Some Day Smoker -- 0.50 packs/day for 47 years    Types: Cigarettes   Smokeless tobacco: Never Used   Alcohol Use: 0.0 oz/week     Comment: heavy drinker, last alcohol was yesterday    Drug Use: No   Sexual Activity: Not on file   Other Topics Concern   Not on file   Social History Narrative   Patient is single, retired   Patient is right handed   Education level is high school   Caffeine consumption is 1 cup daily   No Known Allergies  Review of Systems  Gastrointestinal: Negative for abdominal pain.  Neurological: Positive for headaches.  All other systems reviewed and are negative.     Objective:  Physical Exam  Nursing note and vitals reviewed. Constitutional: He is oriented to person, place, and time. He appears well-developed and well-nourished. No distress.  HENT:  Head: Normocephalic and atraumatic.  Eyes: EOM are normal.  Neck: Neck supple. No tracheal deviation present.  Cardiovascular: Normal rate.   Pulmonary/Chest: Effort normal. No respiratory distress.  Musculoskeletal: Normal range of motion.  Neurological: He is alert and oriented to person, place, and time.  Skin: Skin is warm and dry.  Psychiatric: He has a normal mood and affect. His behavior is normal.   Results for orders placed in visit on 08/28/13  POCT GLYCOSYLATED HEMOGLOBIN (HGB A1C)      Result Value Ref Range   Hemoglobin A1C 6.3       BP 118/70   Pulse 74   Temp(Src) 98 F (36.7 C) (Oral)   Resp 16   Ht 5' 9.5" (1.765 m)   Wt 204 lb 3.2 oz (92.625 kg)   BMI 29.73 kg/m2   SpO2 98%  Assessment & Plan:    I personally performed the services described in this documentation, which was scribed in my presence. The  recorded information has been reviewed and is accurate.  Unspecified essential hypertension - Plan: POCT SEDIMENTATION RATE, COMPLETE METABOLIC PANEL WITH GFR, CBC with Differential  Frequent headaches - Plan: POCT SEDIMENTATION RATE, COMPLETE METABOLIC PANEL WITH GFR, CBC with Differential  Elevated blood sugar - Plan: POCT glycosylated hemoglobin (Hb A1C)  Signed, Robyn Haber, MD

## 2013-08-29 ENCOUNTER — Ambulatory Visit
Admission: RE | Admit: 2013-08-29 | Discharge: 2013-08-29 | Disposition: A | Discharge status: Home / Self Care | Payer: Medicare Other | Source: Ambulatory Visit | Attending: Neurology | Admitting: Neurology

## 2013-08-29 DIAGNOSIS — R51 Headache: Secondary | ICD-10-CM

## 2013-08-29 DIAGNOSIS — Z8673 Personal history of transient ischemic attack (TIA), and cerebral infarction without residual deficits: Secondary | ICD-10-CM | POA: Diagnosis not present

## 2013-08-30 ENCOUNTER — Other Ambulatory Visit: Payer: Self-pay | Admitting: Neurology

## 2013-09-03 ENCOUNTER — Telehealth: Payer: Self-pay | Admitting: *Deleted

## 2013-09-03 NOTE — Progress Notes (Signed)
Quick Note:  Please call patient, MRI brain showed old stroke, no change compared to previous scan in 2013. ______

## 2013-09-03 NOTE — Progress Notes (Signed)
Quick Note:  Patient returned call and I shared MR Brain results per Dr Rhea Belton findings , verbalized understanding but said that he is still having headaches, comes and goes. The medication prescribed    Demographics Andre [name] 73 year old male  [place] [address] [phone] [facility])  Comm Pref: None [place] [address] [phone] (M)  Advanced Directives 08/21/13 07/10/11 07/10/11  Advance Directive Patient does not have advance directive, Patient would not like information Patient does not have advance directive Patient does not have advance directive  Problem ListChronicEssential hypertension, benign  Headache  Rectal cancer  GERD (gastroesophageal reflux disease)  Nicotine addiction  Alcohol abuse, in remission  Cervical stenosis of spinal canal  Left pontine CVA  Constipation  Health MaintenanceHoldSoonDueLate   Topic Due Last Communication   Tetanus/tdap 12/27/1960   Zostavax 12/27/2001   Pneumococcal Polysaccharide Vaccine Age 32 And Over 12/28/2006   Influenza Vaccine 01/10/2013   Colonoscopy 01/03/2023  Reminders and ResultsDone  None  Care Team and CommunicationsReferring Provider  No referring provider set  PCPs Type  Posey Boyer, MD General  Other Patient Care Team Members Relationship  None  Recipients of Past Communications  Posey Boyer, MD 08/21/2013  Posey Boyer, MD 06/05/2013  Posey Boyer, MD 05/24/2013  Posey Boyer, MD 05/24/2013  Posey Boyer, MD 01/15/2013  Posey Boyer, MD 01/15/2013  Posey Boyer, MD 07/18/2012  Beryle Beams, MD 08/10/2011  Posey Boyer, MD 08/10/2011  Posey Boyer, MD 08/09/2011  Beryle Beams, MD 08/09/2011  Mal Misty, MD 08/09/2011  Beryle Beams, MD 08/09/2011  Posey Boyer, MD 08/09/2011  Posey Boyer, MD 06/26/2011  Posey Boyer, MD 06/23/2011  Posey Boyer, MD 06/04/2011  Posey Boyer, MD 06/04/2011  Per Patient No Pcp 05/15/2011  Per Patient No  Pcp 05/15/2011  My Last Outpatient Progress NoteStatus Last Edited Encounter Date  Signed Wed Aug 27, 2013 8:25 AM EDT 08/25/2013  Quick Note:  Called to share normal labs, patient verbalized understanding ______   Date of Birth07/16/1942AllergiesMark as: Review Complete  No Known Allergies   Last Reviewed by Nickolas Madrid, CMA on 08/28/2013 at 11:09 AM: Review CompleteMedicationsLong-Term amLODipine (NORVASC) 10 MG tablet   aspirin EC 81 MG tablet   divalproex (DEPAKOTE ER) 500 MG 24 hr tablet   lisinopril (PRINIVIL,ZESTRIL) 20 MG tablet   meloxicam (MOBIC) 7.5 MG tablet   nortriptyline (PAMELOR) 25 MG capsule   omeprazole (PRILOSEC) 20 MG capsule   propranolol (INDERAL) 40 MG tablet   traMADol (ULTRAM) 50 MG tablet   Immunizations/Injections  None  Significant History/Details  Smoking: Current Some Day Smoker, .5 ppd, 23.5 pack-years  Smokeless Tobacco: Never Used  Alcohol: 0.0 oz alcohol/week  No open orders  Preferred Language: English  Specialty CommentsEditShow AllReportNo comments regarding your specialty  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, 8 boys, 3 girls             shared MRI brain results with patient per Dr Rhea Belton findings, he verbalized understanding but wanted to let physician aware that he is still having headaches, it comes and goes, medication given is not working         Demographics Andre [name] 73 year old male  [place] [address] [phone] [facility])  [place]: None [place]  [place] [address] [phone] (M)  Advanced Directives 08/21/13 07/10/11  07/10/11  Advance Directive Patient does not have advance directive, Patient would not like information Patient does not have advance directive Patient does not have advance directive  Problem ListChronicEssential hypertension, benign  Headache  Rectal cancer  GERD (gastroesophageal reflux disease)  Nicotine addiction   Alcohol abuse, in remission  Cervical stenosis of spinal canal  Left pontine CVA  Constipation  Health MaintenanceHoldSoonDueLate   Topic Due Last Communication   Tetanus/tdap 12/27/1960   Zostavax 12/27/2001   Pneumococcal Polysaccharide Vaccine Age 3 And Over 12/28/2006   Influenza Vaccine 01/10/2013   Colonoscopy 01/03/2023  Reminders and ResultsDone  None  Care Team and CommunicationsReferring Provider  No referring provider set  PCPs Type  Posey Boyer, MD General  Other Patient Care Team Members Relationship  None  Recipients of Past Communications  Posey Boyer, MD 08/21/2013  Posey Boyer, MD 06/05/2013  Posey Boyer, MD 05/24/2013  Posey Boyer, MD 05/24/2013  Posey Boyer, MD 01/15/2013  Posey Boyer, MD 01/15/2013  Posey Boyer, MD 07/18/2012  Beryle Beams, MD 08/10/2011  Posey Boyer, MD 08/10/2011  Posey Boyer, MD 08/09/2011  Beryle Beams, MD 08/09/2011  Mal Misty, MD 08/09/2011  Beryle Beams, MD 08/09/2011  Posey Boyer, MD 08/09/2011  Posey Boyer, MD 06/26/2011  Posey Boyer, MD 06/23/2011  Posey Boyer, MD 06/04/2011  Posey Boyer, MD 06/04/2011  Per Patient No Pcp 05/15/2011  Per Patient No Pcp 05/15/2011  My Last Outpatient Progress NoteStatus Last Edited Encounter Date  Signed Wed Aug 27, 2013 8:25 AM EDT 08/25/2013  Quick Note:  Called to share normal labs, patient verbalized understanding ______   Date of BirthJuly 07, 1942AllergiesMark as: Review Complete  No Known Allergies   Last Reviewed by Nickolas Madrid, CMA on 08/28/2013 at 11:09 AM: Review CompleteMedicationsLong-Term amLODipine (NORVASC) 10 MG tablet   aspirin EC 81 MG tablet   divalproex (DEPAKOTE ER) 500 MG 24 hr tablet   lisinopril (PRINIVIL,ZESTRIL) 20 MG tablet   meloxicam (MOBIC) 7.5 MG tablet   nortriptyline (PAMELOR) 25 MG capsule   omeprazole (PRILOSEC) 20 MG capsule   propranolol (INDERAL) 40 MG tablet   traMADol (ULTRAM) 50 MG tablet    Immunizations/Injections  None  Significant History/Details  Smoking: Current Some Day Smoker, .5 ppd, 23.5 pack-years  Smokeless Tobacco: Never Used  Alcohol: 0.0 oz alcohol/week  No open orders  Preferred Language: English  Specialty CommentsEditShow AllReportNo comments regarding your specialty  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

## 2013-09-04 NOTE — Telephone Encounter (Signed)
Message error

## 2013-09-25 ENCOUNTER — Other Ambulatory Visit: Payer: Self-pay

## 2013-09-25 MED ORDER — AMLODIPINE BESYLATE 10 MG PO TABS: 10.0000 mg | ORAL_TABLET | Freq: Every day | ORAL | Status: DC

## 2013-09-25 MED ORDER — LISINOPRIL 20 MG PO TABS: 20.0000 mg | ORAL_TABLET | Freq: Every day | ORAL | Status: DC

## 2013-10-07 ENCOUNTER — Telehealth: Payer: Self-pay

## 2013-10-07 ENCOUNTER — Other Ambulatory Visit: Payer: Self-pay | Admitting: Family Medicine

## 2013-10-07 NOTE — Telephone Encounter (Signed)
Patient called requesting a refill on Tramadol 50 mg. Patient also states he is having migraines 3 to 4 times a day. [phone]

## 2013-10-07 NOTE — Telephone Encounter (Signed)
Please ask patient to come to discuss thiis situation

## 2013-10-08 NOTE — Telephone Encounter (Signed)
Patient is coming into the clinic tomorrow around 5pm.

## 2013-11-21 ENCOUNTER — Other Ambulatory Visit: Payer: Self-pay | Admitting: Cardiovascular Disease

## 2013-11-28 ENCOUNTER — Other Ambulatory Visit: Payer: Self-pay | Admitting: Cardiovascular Disease

## 2013-12-23 ENCOUNTER — Other Ambulatory Visit: Payer: Self-pay | Admitting: Cardiovascular Disease

## 2013-12-26 ENCOUNTER — Other Ambulatory Visit: Payer: Self-pay | Admitting: Cardiovascular Disease

## 2013-12-30 ENCOUNTER — Other Ambulatory Visit: Payer: Self-pay | Admitting: Cardiovascular Disease

## 2013-12-31 ENCOUNTER — Telehealth: Payer: Self-pay | Admitting: Nurse Practitioner

## 2013-12-31 ENCOUNTER — Ambulatory Visit: Payer: Medicare Other | Admitting: Nurse Practitioner

## 2013-12-31 NOTE — Telephone Encounter (Signed)
No show for scheduled appt 

## 2014-01-14 ENCOUNTER — Encounter (HOSPITAL_COMMUNITY): Payer: Self-pay | Admitting: Emergency Medicine

## 2014-01-14 ENCOUNTER — Telehealth: Payer: Self-pay | Admitting: *Deleted

## 2014-01-14 ENCOUNTER — Emergency Department (HOSPITAL_COMMUNITY): Payer: Medicare Other

## 2014-01-14 ENCOUNTER — Emergency Department (HOSPITAL_COMMUNITY)
Admission: EM | Admit: 2014-01-14 | Discharge: 2014-01-14 | Disposition: A | Discharge status: Home / Self Care | Payer: Medicare Other | Attending: Emergency Medicine | Admitting: Emergency Medicine

## 2014-01-14 DIAGNOSIS — R0789 Other chest pain: Secondary | ICD-10-CM | POA: Insufficient documentation

## 2014-01-14 DIAGNOSIS — K219 Gastro-esophageal reflux disease without esophagitis: Secondary | ICD-10-CM | POA: Insufficient documentation

## 2014-01-14 DIAGNOSIS — Z8673 Personal history of transient ischemic attack (TIA), and cerebral infarction without residual deficits: Secondary | ICD-10-CM | POA: Diagnosis not present

## 2014-01-14 DIAGNOSIS — Z7982 Long term (current) use of aspirin: Secondary | ICD-10-CM | POA: Insufficient documentation

## 2014-01-14 DIAGNOSIS — R079 Chest pain, unspecified: Secondary | ICD-10-CM | POA: Insufficient documentation

## 2014-01-14 DIAGNOSIS — Z79899 Other long term (current) drug therapy: Secondary | ICD-10-CM | POA: Diagnosis not present

## 2014-01-14 DIAGNOSIS — F17200 Nicotine dependence, unspecified, uncomplicated: Secondary | ICD-10-CM | POA: Insufficient documentation

## 2014-01-14 DIAGNOSIS — I10 Essential (primary) hypertension: Secondary | ICD-10-CM | POA: Diagnosis not present

## 2014-01-14 DIAGNOSIS — Z85048 Personal history of other malignant neoplasm of rectum, rectosigmoid junction, and anus: Secondary | ICD-10-CM | POA: Diagnosis not present

## 2014-01-14 DIAGNOSIS — M129 Arthropathy, unspecified: Secondary | ICD-10-CM | POA: Diagnosis not present

## 2014-01-14 LAB — TROPONIN I

## 2014-01-14 LAB — BASIC METABOLIC PANEL
Anion gap: 16 — ABNORMAL HIGH (ref 5–15)
BUN: 4 mg/dL — ABNORMAL LOW (ref 6–23)
CO2: 21 mEq/L (ref 19–32)
Calcium: 9.2 mg/dL (ref 8.4–10.5)
Chloride: 103 mEq/L (ref 96–112)
Creatinine, Ser: 0.67 mg/dL (ref 0.50–1.35)
GFR calc Af Amer: >90 mL/min (ref >90)
GFR calc non Af Amer: >90 mL/min (ref >90)
Glucose, Bld: 107 mg/dL — ABNORMAL HIGH (ref 70–99)
Potassium: 3.9 mEq/L (ref 3.7–5.3)
Sodium: 140 mEq/L (ref 137–147)

## 2014-01-14 LAB — CBC
HCT: 50.7 % (ref 39.0–52.0)
Hemoglobin: 17.3 g/dL — ABNORMAL HIGH (ref 13.0–17.0)
MCH: 31.7 pg (ref 26.0–34.0)
MCHC: 34.1 g/dL (ref 30.0–36.0)
MCV: 93 fL (ref 78.0–100.0)
Platelets: 218 10*3/uL (ref 150–400)
RBC: 5.45 MIL/uL (ref 4.22–5.81)
RDW: 14.9 % (ref 11.5–15.5)
WBC: 4.8 10*3/uL (ref 4.0–10.5)

## 2014-01-14 LAB — PRO B NATRIURETIC PEPTIDE: PRO B NATRI PEPTIDE: 34.8 pg/mL (ref 0–125)

## 2014-01-14 MED ORDER — FAMOTIDINE 20 MG PO TABS: 20.0000 mg | ORAL_TABLET | Freq: Two times a day (BID) | ORAL | Status: DC

## 2014-01-14 MED ORDER — GI COCKTAIL ~~LOC~~
30.0000 mL | Freq: Once | ORAL | Status: AC
  Administered 2014-01-14: 30 mL via ORAL
  Filled 2014-01-14: qty 30

## 2014-01-14 MED ORDER — NITROGLYCERIN 0.4 MG SL SUBL: 0.4000 mg | SUBLINGUAL_TABLET | SUBLINGUAL | Status: DC | PRN

## 2014-01-14 MED ORDER — ASPIRIN 325 MG PO TABS: 325.0000 mg | ORAL_TABLET | ORAL | Status: DC

## 2014-01-14 NOTE — ED Provider Notes (Signed)
CSN: [id number]     Arrival date & time 01/14/14  0615 History   None    Chief Complaint  Patient presents with  . Chest Pain     (Consider location/radiation/quality/duration/timing/severity/associated sxs/prior Treatment) HPI Comments: 73 year old male with a history of hypertension, rectal cancer and acid reflux disease. He was admitted in March of this year and evaluated with a left C. scan stress test because of chest pain, this was read as low risk for acute coronary syndrome with no ischemic findings and no reversible ischemia with a normal ejection fraction. He was noted have acid reflux and intermittent noncardiac chest pain. He states that over the last several days he has had chest pain described as a burning sensation in the middle of his chest which seems to get worse at night and when he wakes up in the morning it is particularly bad. This burning sensation radiates up into his upper chest. He denies coughing fevers chills shortness of breath back pain or swelling of his legs. He took his medications for acid reflux including Prilosec last night but states it did not help.  The history is provided by the patient.    Past Medical History  Diagnosis Date  . Arthritis   . Hypertension   . Rectal cancer   . Weakness   . HA (headache)   . TIA (transient ischemic attack)   . Chest pain   . GERD (gastroesophageal reflux disease)    Past Surgical History  Procedure Laterality Date  . Surgical excision of rectal cancer     Family History  Problem Relation Age of Onset  . Hypertension Mother   . Cancer Mother    History  Substance Use Topics  . Smoking status: Current Some Day Smoker -- 0.50 packs/day for 47 years    Types: Cigarettes  . Smokeless tobacco: Never Used  . Alcohol Use: 0.0 oz/week     Comment: heavy drinker, last alcohol was yesterday     Review of Systems  All other systems reviewed and are negative.     Allergies  Review of patient's allergies  indicates no known allergies.  Home Medications   Prior to Admission medications   Medication Sig Start Date End Date Taking? Authorizing Provider  amLODipine (NORVASC) 10 MG tablet take 1 tablet by mouth once daily 12/30/13  Yes Sherren Mocha, MD  aspirin EC 81 MG tablet Take 81 mg by mouth daily.    Yes Historical Provider, MD  lisinopril (PRINIVIL,ZESTRIL) 20 MG tablet take 1 tablet by mouth once daily   Yes Sherren Mocha, MD  famotidine (PEPCID) 20 MG tablet Take 1 tablet (20 mg total) by mouth 2 (two) times daily. 01/14/14   Johnna Acosta, MD   BP 133/74  Pulse 66  Temp(Src) 97.6 F (36.4 C) (Oral)  Resp 13  SpO2 96% Physical Exam  Nursing note and vitals reviewed. Constitutional: He appears well-developed and well-nourished. No distress.  HENT:  Head: Normocephalic and atraumatic.  Mouth/Throat: Oropharynx is clear and moist. No oropharyngeal exudate.  Eyes: Conjunctivae and EOM are normal. Pupils are equal, round, and reactive to light. Right eye exhibits no discharge. Left eye exhibits no discharge. No scleral icterus.  Neck: Normal range of motion. Neck supple. No JVD present. No thyromegaly present.  Cardiovascular: Normal rate, regular rhythm, normal heart sounds and intact distal pulses.  Exam reveals no gallop and no friction rub.   No murmur heard. Pulmonary/Chest: Effort normal and breath sounds normal. No respiratory  distress. He has no wheezes. He has no rales.  Abdominal: Soft. Bowel sounds are normal. He exhibits no distension and no mass. There is no tenderness.  Musculoskeletal: Normal range of motion. He exhibits no edema and no tenderness.  Lymphadenopathy:    He has no cervical adenopathy.  Neurological: He is alert. Coordination normal.  Skin: Skin is warm and dry. No rash noted. No erythema.  Psychiatric: He has a normal mood and affect. His behavior is normal.    ED Course  Procedures (including critical care time) Labs Review Labs Reviewed  CBC -  Abnormal; Notable for the following:    Hemoglobin 17.3 (*)    All other components within normal limits  BASIC METABOLIC PANEL - Abnormal; Notable for the following:    Glucose, Bld 107 (*)    BUN 4 (*)    Anion gap 16 (*)    All other components within normal limits  PRO B NATRIURETIC PEPTIDE  TROPONIN I    Imaging Review Dg Chest Port 1 View  01/14/2014   CLINICAL DATA:  Chest pain.  EXAM: PORTABLE CHEST - 1 VIEW  COMPARISON:  08/21/2013  FINDINGS: Mild to moderate cardiomegaly which is chronic. Unchanged aortic tortuosity.  No edema, consolidation, effusion, or pneumothorax.  No osseous findings to explain chest pain.  IMPRESSION: No active disease.   Electronically Signed   By: Jorje Guild M.D.   On: 01/14/2014 06:54     EKG Interpretation   Date/Time:  Wednesday January 14 2014 06:18:22 EDT Ventricular Rate:  71 PR Interval:  194 QRS Duration: 92 QT Interval:  392 QTC Calculation: 425 R Axis:   -38 Text Interpretation:  Normal sinus rhythm with sinus arrhythmia Left axis  deviation Nonspecific ST and T wave abnormality Abnormal ECG since last  tracing no significant change Confirmed by Tome Wilson  MD, Jordane Hisle ([id number]) on  01/14/2014 6:41:39 AM      MDM   Final diagnoses:  Other chest pain  Gastroesophageal reflux disease, esophagitis presence not specified    The patient has an EKG which is nonischemic, it is unchanged since his last EKG, he had a normal stress test just 4 months ago. Will give a GI cocktail, check labs but doubt cardiac source of the patient's chest pain. It is positional and burning in quality, anticipate acid reflux as source.  Labs normal, d/w pt - stable for d/c - likely GERD, meds as below  Meds given in ED:  Medications  gi cocktail (Maalox,Lidocaine,Donnatal) (30 mLs Oral Given 01/14/14 0733)    Discharge Medication List as of 01/14/2014  7:29 AM    START taking these medications   Details  famotidine (PEPCID) 20 MG tablet Take 1 tablet (20  mg total) by mouth 2 (two) times daily., Starting 01/14/2014, Until Discontinued, Print            Johnna Acosta, MD 01/14/14 2053

## 2014-01-14 NOTE — Discharge Instructions (Signed)
Take pepcid twice daily for next 10 days in addition to your Prilosec - follow up with your doctor in next 48 hours if symptoms persist.  Please call your doctor for a followup appointment within 24-48 hours. When you talk to your doctor please let them know that you were seen in the emergency department and have them acquire all of your records so that they can discuss the findings with you and formulate a treatment plan to fully care for your new and ongoing problems.

## 2014-01-14 NOTE — ED Notes (Addendum)
Pt to ED from home c/o chest pressure to L side of chest since 8pm, reports pressure increased while in bed asleep this morning about 330am. Pressure associated with shortness of breath and lightheadedness. Also c/o headache with blurred vision

## 2014-01-14 NOTE — Telephone Encounter (Signed)
Pt called and wanted Dr. Linna Darner to know he went into ER about 3 am this morning with chest pains.   ER told him to follow up with Dr. Linna Darner in 3 days.   I told him Dr. Lemmie Evens would be on duty Sat 8th .

## 2014-01-14 NOTE — ED Notes (Signed)
Patient reports he consumes alcohol on the regular.

## 2014-01-14 NOTE — ED Notes (Signed)
MD at bedside. 

## 2014-01-20 ENCOUNTER — Ambulatory Visit (INDEPENDENT_AMBULATORY_CARE_PROVIDER_SITE_OTHER): Payer: Medicare Other | Admitting: Family Medicine

## 2014-01-20 VITALS — BP 128/80 | HR 77 | Temp 97.8°F | Resp 16 | Ht 69.75 in | Wt 193.4 lb

## 2014-01-20 DIAGNOSIS — R7303 Prediabetes: Secondary | ICD-10-CM

## 2014-01-20 DIAGNOSIS — K219 Gastro-esophageal reflux disease without esophagitis: Secondary | ICD-10-CM

## 2014-01-20 DIAGNOSIS — I10 Essential (primary) hypertension: Secondary | ICD-10-CM | POA: Diagnosis not present

## 2014-01-20 DIAGNOSIS — R7309 Other abnormal glucose: Secondary | ICD-10-CM

## 2014-01-20 MED ORDER — FAMOTIDINE 20 MG PO TABS: 20.0000 mg | ORAL_TABLET | Freq: Two times a day (BID) | ORAL | Status: DC

## 2014-01-20 MED ORDER — LISINOPRIL 20 MG PO TABS: ORAL_TABLET | ORAL | Status: DC

## 2014-01-20 MED ORDER — OMEPRAZOLE 40 MG PO CPDR: 40.0000 mg | DELAYED_RELEASE_CAPSULE | Freq: Every day | ORAL | Status: DC

## 2014-01-20 MED ORDER — FAMOTIDINE 20 MG PO TABS
20.0000 mg | ORAL_TABLET | Freq: Two times a day (BID) | ORAL | Status: DC
Start: 2014-01-20 — End: 2014-01-20

## 2014-01-20 MED ORDER — AMLODIPINE BESYLATE 10 MG PO TABS: ORAL_TABLET | ORAL | Status: DC

## 2014-01-20 NOTE — Progress Notes (Signed)
Subjective:    Patient ID: Andre [name], male    DOB: 1940-12-05, 73 y.o.   MRN: [id number] Chief Complaint  Patient presents with  . Follow-up    Was recently in the hospital for chest pains  . Medication Refill    lisinopril 20mg  and amlodipine besylate 10mg     HPI   Pt was seen in the ER last wk for CP - fortunately pt had a nml stress test 4 mos prior, EKG was unchanged, and trop and bnp were neg for acute coronary inj.  CP was substernal, burning, and attributed to GERD after improvement with a GI cocktail so pt was instructed to start pepcid bid which he reports has helped some though still occ having the burning substernal CP occ, worse at night.  Used to be on prilosec but was taken off 4 mos ago as was having a lot of non-specific HAs/lightheadedness sxs so medications were minimized Checks BP at home and runs normal - "good" - about 80s on the bottom, can't remember systolic.   Past Medical History  Diagnosis Date  . Arthritis   . Hypertension   . Rectal cancer   . Weakness   . HA (headache)   . TIA (transient ischemic attack)   . Chest pain   . GERD (gastroesophageal reflux disease)    Current Outpatient Prescriptions on File Prior to Visit  Medication Sig Dispense Refill  . aspirin EC 81 MG tablet Take 81 mg by mouth daily.       . [DISCONTINUED] chlorthalidone (HYGROTON) 25 MG tablet Take 25 mg by mouth daily.       No current facility-administered medications on file prior to visit.   No Known Allergies   Review of Systems  Constitutional: Negative for fever, chills, activity change, appetite change and unexpected weight change.  Respiratory: Negative for chest tightness, shortness of breath and wheezing.   Cardiovascular: Positive for chest pain. Negative for palpitations and leg swelling.  Endocrine: Negative for polydipsia, polyphagia and polyuria.  Genitourinary: Negative for frequency (nocturia of 1x/night only).  Neurological: Negative for dizziness,  syncope, weakness, light-headedness and headaches.      BP 128/80  Pulse 77  Temp(Src) 97.8 F (36.6 C) (Oral)  Resp 16  Ht 5' 9.75" (1.772 m)  Wt 193 lb 6.4 oz (87.726 kg)  BMI 27.94 kg/m2  SpO2 96% Objective:   Physical Exam  Constitutional: He is oriented to person, place, and time. He appears well-developed and well-nourished. No distress.  HENT:  Head: Normocephalic and atraumatic.  Eyes: Conjunctivae are normal. Pupils are equal, round, and reactive to light. No scleral icterus.  Neck: Normal range of motion. Neck supple. No thyromegaly present.  Cardiovascular: Normal rate, regular rhythm, normal heart sounds and intact distal pulses.   Pulmonary/Chest: Effort normal and breath sounds normal. No respiratory distress. He exhibits no tenderness and no bony tenderness.  Abdominal: Soft. Bowel sounds are normal. He exhibits distension. He exhibits no mass. There is no tenderness.  Musculoskeletal: He exhibits no edema.  Lymphadenopathy:    He has no cervical adenopathy.  Neurological: He is alert and oriented to person, place, and time.  Skin: Skin is warm and dry. He is not diaphoretic.  Psychiatric: He has a normal mood and affect. His behavior is normal.      Assessment & Plan:   Gastroesophageal reflux disease, esophagitis presence not specified - no longer on ppi but sxs have improved w/ famotidine bid so cont and restart ppi  as well.  Reviewed GERD diet.  Essential hypertension, benign - bp under good control, bmp 5d prior at ER nml, f/u for med refills w/in 6 mos and fasting labs.  Prediabetes - recommend repeating a1c today as was 6.3 4 mos prior, pt declines labs - recheck at f/u   Meds ordered this encounter  Medications  . amLODipine (NORVASC) 10 MG tablet    Sig: take 1 tablet by mouth once daily    Dispense:  90 tablet    Refill:  1  . lisinopril (PRINIVIL,ZESTRIL) 20 MG tablet    Sig: take 1 tablet by mouth once daily    Dispense:  90 tablet    Refill:   1  . DISCONTD: famotidine (PEPCID) 20 MG tablet    Sig: Take 1 tablet (20 mg total) by mouth 2 (two) times daily.    Dispense:  180 tablet    Refill:  1  . omeprazole (PRILOSEC) 40 MG capsule    Sig: Take 1 capsule (40 mg total) by mouth daily.    Dispense:  90 capsule    Refill:  1  . famotidine (PEPCID) 20 MG tablet    Sig: Take 1 tablet (20 mg total) by mouth 2 (two) times daily.    Dispense:  180 tablet    Refill:  1     Delman Cheadle, MD MPH

## 2014-01-20 NOTE — Patient Instructions (Signed)
We want to see you back in the office before your medications expire.  You will need blood work on that day so take your medications on an empty stomach with water or black coffee only that morning.  Food Choices for Gastroesophageal Reflux Disease When you have gastroesophageal reflux disease (GERD), the foods you eat and your eating habits are very important. Choosing the right foods can help ease the discomfort of GERD. WHAT GENERAL GUIDELINES DO I NEED TO FOLLOW?  Choose fruits, vegetables, whole grains, low-fat dairy products, and low-fat meat, fish, and poultry.  Limit fats such as oils, salad dressings, butter, nuts, and avocado.  Keep a food diary to identify foods that cause symptoms.  Avoid foods that cause reflux. These may be different for different people.  Eat frequent small meals instead of three large meals each day.  Eat your meals slowly, in a relaxed setting.  Limit fried foods.  Cook foods using methods other than frying.  Avoid drinking alcohol.  Avoid drinking large amounts of liquids with your meals.  Avoid bending over or lying down until 2-3 hours after eating. WHAT FOODS ARE NOT RECOMMENDED? The following are some foods and drinks that may worsen your symptoms: Vegetables Tomatoes. Tomato juice. Tomato and spaghetti sauce. Chili peppers. Onion and garlic. Horseradish. Fruits Oranges, grapefruit, and lemon (fruit and juice). Meats High-fat meats, fish, and poultry. This includes hot dogs, ribs, ham, sausage, salami, and bacon. Dairy Whole milk and chocolate milk. Sour cream. Cream. Butter. Ice cream. Cream cheese.  Beverages Coffee and tea, with or without caffeine. Carbonated beverages or energy drinks. Condiments Hot sauce. Barbecue sauce.  Sweets/Desserts Chocolate and cocoa. Donuts. Peppermint and spearmint. Fats and Oils High-fat foods, including [place] fries and potato chips. Other Vinegar. Strong spices, such as black pepper, white pepper,  red pepper, cayenne, curry powder, cloves, ginger, and chili powder. The items listed above may not be a complete list of foods and beverages to avoid. Contact your dietitian for more information. Document Released: 05/29/2005 Document Revised: 06/03/2013 Document Reviewed: 04/02/2013 [facility] Patient Information 2015 [place], [place]. This information is not intended to replace advice given to you by your health care provider. Make sure you discuss any questions you have with your health care provider. Gastroesophageal Reflux Disease, Adult Gastroesophageal reflux disease (GERD) happens when acid from your stomach flows up into the esophagus. When acid comes in contact with the esophagus, the acid causes soreness (inflammation) in the esophagus. Over time, GERD may create small holes (ulcers) in the lining of the esophagus. CAUSES   Increased body weight. This puts pressure on the stomach, making acid rise from the stomach into the esophagus.  Smoking. This increases acid production in the stomach.  Drinking alcohol. This causes decreased pressure in the lower esophageal sphincter (valve or ring of muscle between the esophagus and stomach), allowing acid from the stomach into the esophagus.  Late evening meals and a full stomach. This increases pressure and acid production in the stomach.  A malformed lower esophageal sphincter. Sometimes, no cause is found. SYMPTOMS   Burning pain in the lower part of the mid-chest behind the breastbone and in the mid-stomach area. This may occur twice a week or more often.  Trouble swallowing.  Sore throat.  Dry cough.  Asthma-like symptoms including chest tightness, shortness of breath, or wheezing. DIAGNOSIS  Your caregiver may be able to diagnose GERD based on your symptoms. In some cases, X-rays and other tests may be done to check for  complications or to check the condition of your stomach and esophagus. TREATMENT  Your caregiver may recommend  over-the-counter or prescription medicines to help decrease acid production. Ask your caregiver before starting or adding any new medicines.  HOME CARE INSTRUCTIONS   Change the factors that you can control. Ask your caregiver for guidance concerning weight loss, quitting smoking, and alcohol consumption.  Avoid foods and drinks that make your symptoms worse, such as:  Caffeine or alcoholic drinks.  Chocolate.  Peppermint or mint flavorings.  Garlic and onions.  Spicy foods.  Citrus fruits, such as oranges, lemons, or limes.  Tomato-based foods such as sauce, chili, salsa, and pizza.  Fried and fatty foods.  Avoid lying down for the 3 hours prior to your bedtime or prior to taking a nap.  Eat small, frequent meals instead of large meals.  Wear loose-fitting clothing. Do not wear anything tight around your waist that causes pressure on your stomach.  Raise the head of your bed 6 to 8 inches with wood blocks to help you sleep. Extra pillows will not help.  Only take over-the-counter or prescription medicines for pain, discomfort, or fever as directed by your caregiver.  Do not take aspirin, ibuprofen, or other nonsteroidal anti-inflammatory drugs (NSAIDs). SEEK IMMEDIATE MEDICAL CARE IF:   You have pain in your arms, neck, jaw, teeth, or back.  Your pain increases or changes in intensity or duration.  You develop nausea, vomiting, or sweating (diaphoresis).  You develop shortness of breath, or you faint.  Your vomit is green, yellow, black, or looks like coffee grounds or blood.  Your stool is red, bloody, or black. These symptoms could be signs of other problems, such as heart disease, gastric bleeding, or esophageal bleeding. MAKE SURE YOU:   Understand these instructions.  Will watch your condition.  Will get help right away if you are not doing well or get worse. Document Released: 03/08/2005 Document Revised: 08/21/2011 Document Reviewed:  12/16/2010 [facility] Patient Information 2015 [place], [place]. This information is not intended to replace advice given to you by your health care provider. Make sure you discuss any questions you have with your health care provider.

## 2014-01-21 DIAGNOSIS — K645 Perianal venous thrombosis: Secondary | ICD-10-CM | POA: Diagnosis not present

## 2014-02-11 ENCOUNTER — Encounter: Payer: Self-pay | Admitting: Family Medicine

## 2014-02-11 DIAGNOSIS — K649 Unspecified hemorrhoids: Secondary | ICD-10-CM

## 2014-02-18 DIAGNOSIS — K645 Perianal venous thrombosis: Secondary | ICD-10-CM | POA: Diagnosis not present

## 2014-02-23 ENCOUNTER — Ambulatory Visit (INDEPENDENT_AMBULATORY_CARE_PROVIDER_SITE_OTHER): Payer: Medicare Other | Admitting: Emergency Medicine

## 2014-02-23 ENCOUNTER — Ambulatory Visit (INDEPENDENT_AMBULATORY_CARE_PROVIDER_SITE_OTHER): Payer: Medicare Other

## 2014-02-23 VITALS — BP 138/74 | HR 82 | Temp 98.3°F | Resp 16 | Ht 69.75 in | Wt 194.4 lb

## 2014-02-23 DIAGNOSIS — R0789 Other chest pain: Secondary | ICD-10-CM

## 2014-02-23 DIAGNOSIS — E538 Deficiency of other specified B group vitamins: Secondary | ICD-10-CM

## 2014-02-23 DIAGNOSIS — R739 Hyperglycemia, unspecified: Secondary | ICD-10-CM

## 2014-02-23 DIAGNOSIS — I6350 Cerebral infarction due to unspecified occlusion or stenosis of unspecified cerebral artery: Secondary | ICD-10-CM | POA: Diagnosis not present

## 2014-02-23 DIAGNOSIS — R7309 Other abnormal glucose: Secondary | ICD-10-CM

## 2014-02-23 DIAGNOSIS — I639 Cerebral infarction, unspecified: Secondary | ICD-10-CM

## 2014-02-23 LAB — GLUCOSE, POCT (MANUAL RESULT ENTRY): POC Glucose: 112 mg/dl — AB (ref 70–99)

## 2014-02-23 LAB — POCT CBC
GRANULOCYTE PERCENT: 54.4 % (ref 37–80)
HCT, POC: 51 % (ref 43.5–53.7)
Hemoglobin: 16.4 g/dL (ref 14.1–18.1)
LYMPH, POC: 2.4 (ref 0.6–3.4)
MCH, POC: 30.8 pg (ref 27–31.2)
MCHC: 32.2 g/dL (ref 31.8–35.4)
MCV: 95.6 fL (ref 80–97)
MID (CBC): 0.2 (ref 0–0.9)
MPV: 5.7 fL (ref 0–99.8)
PLATELET COUNT, POC: 206 10*3/uL (ref 142–424)
POC GRANULOCYTE: 3 (ref 2–6.9)
POC LYMPH %: 42.6 % (ref 10–50)
POC MID %: 3 %M (ref 0–12)
RBC: 5.33 M/uL (ref 4.69–6.13)
RDW, POC: 17 %
WBC: 5.6 10*3/uL (ref 4.6–10.2)

## 2014-02-23 LAB — VITAMIN B12: Vitamin B-12: 258 pg/mL (ref 211–911)

## 2014-02-23 LAB — POCT GLYCOSYLATED HEMOGLOBIN (HGB A1C): Hemoglobin A1C: 6

## 2014-02-23 MED ORDER — OMEPRAZOLE 40 MG PO CPDR: 40.0000 mg | DELAYED_RELEASE_CAPSULE | Freq: Every day | ORAL | Status: DC

## 2014-02-23 NOTE — Progress Notes (Signed)
   Subjective:    Patient ID: Andre [name], male    DOB: 04-Oct-1940, 73 y.o.   MRN: [id number]  HPI patient enters with a chief complaint of chest discomfort. This is associated with phlegm in his throat. He has reflux symptoms and when seen here last visit was placed on Prilosec 40 mg one a day which he has not been taking. He is up-to-date on colonoscopy and has had a history of rectal cancer. He was recently hospitalized for evaluation of chest pain. He has had a stress Cardiolite this year which was negative. He has a history of previous CVAs. In March he had an MRI of his brain by Dr. Krista Blue and is did not reveal any changes. From previous.    Review of Systems     Objective:   Physical Exam patient is alert and cooperative he does not appear in any distress. His neck was supple chest clear heart regular rate no murmurs. Abdomen is slightly distended but no masses and no tenderness. Neurological cranial nerves are intact. There is no focal motor weakness. [facility] reading (PRIMARY) by  Dr. Everlene Farrier the cardiac shadow is mildly enlarged. There are arthritic changes of the thoracic spine. No pneumonic infiltrate is seen no pneumothorax. Results for orders placed in visit on 02/23/14  POCT CBC      Result Value Ref Range   WBC 5.6  4.6 - 10.2 K/uL   Lymph, poc 2.4  0.6 - 3.4   POC LYMPH PERCENT 42.6  10 - 50 %L   MID (cbc) 0.2  0 - 0.9   POC MID % 3.0  0 - 12 %M   POC Granulocyte 3.0  2 - 6.9   Granulocyte percent 54.4  37 - 80 %G   RBC 5.33  4.69 - 6.13 M/uL   Hemoglobin 16.4  14.1 - 18.1 g/dL   HCT, POC 51.0  43.5 - 53.7 %   MCV 95.6  80 - 97 fL   MCH, POC 30.8  27 - 31.2 pg   MCHC 32.2  31.8 - 35.4 g/dL   RDW, POC 17.0     Platelet Count, POC 206  142 - 424 K/uL   MPV 5.7  0 - 99.8 fL  GLUCOSE, POCT (MANUAL RESULT ENTRY)      Result Value Ref Range   POC Glucose 112 (*) 70 - 99 mg/dl  POCT GLYCOSYLATED HEMOGLOBIN (HGB A1C)      Result Value Ref Range   Hemoglobin A1C 6.0            Assessment & Plan:  Patient did not take the Depakote or nortriptyline prescribed by Dr. Krista Blue. He has not been taking his Prilosec for reflux. He has been taking Aleve for his headaches. I have made him an appointment to see Dr. Krista Blue. He will resume Prilosec. He will stop Aleve. Recheck 1 month. Patient refused to take a B12 shot. Patient refused to take a flu shot.

## 2014-02-23 NOTE — Patient Instructions (Signed)
Please resume your baby aspirin one a day. Please stop the Aleve. Take Prilosec once a day for reflux. We will make you an appointment to see Dr. Krista Blue  Esophagitis Esophagitis is inflammation of the esophagus. It can involve swelling, soreness, and pain in the esophagus. This condition can make it difficult and painful to swallow. CAUSES  Most causes of esophagitis are not serious. Many different factors can cause esophagitis, including:  Gastroesophageal reflux disease (GERD). This is when acid from your stomach flows up into the esophagus.  Recurrent vomiting.  An allergic-type reaction.  Certain medicines, especially those that come in large pills.  Ingestion of harmful chemicals, such as household cleaning products.  Heavy alcohol use.  An infection of the esophagus.  Radiation treatment for cancer.  Certain diseases such as sarcoidosis, Crohn's disease, and scleroderma. These diseases may cause recurrent esophagitis. SYMPTOMS   Trouble swallowing.  Painful swallowing.  Chest pain.  Difficulty breathing.  Nausea.  Vomiting.  Abdominal pain. DIAGNOSIS  Your caregiver will take your history and do a physical exam. Depending upon what your caregiver finds, certain tests may also be done, including:  Barium X-ray. You will drink a solution that coats the esophagus, and X-rays will be taken.  Endoscopy. A lighted tube is put down the esophagus so your caregiver can examine the area.  Allergy tests. These can sometimes be arranged through follow-up visits. TREATMENT  Treatment will depend on the cause of your esophagitis. In some cases, steroids or other medicines may be given to help relieve your symptoms or to treat the underlying cause of your condition. Medicines that may be recommended include:  Viscous lidocaine, to soothe the esophagus.  Antacids.  Acid reducers.  Proton pump inhibitors.  Antiviral medicines for certain viral infections of the  esophagus.  Antifungal medicines for certain fungal infections of the esophagus.  Antibiotic medicines, depending on the cause of the esophagitis. HOME CARE INSTRUCTIONS   Avoid foods and drinks that seem to make your symptoms worse.  Eat small, frequent meals instead of large meals.  Avoid eating for the 3 hours prior to your bedtime.  If you have trouble taking pills, use a pill splitter to decrease the size and likelihood of the pill getting stuck or injuring the esophagus on the way down. Drinking water after taking a pill also helps.  Stop smoking if you smoke.  Maintain a healthy weight.  Wear loose-fitting clothing. Do not wear anything tight around your waist that causes pressure on your stomach.  Raise the head of your bed 6 to 8 inches with wood blocks to help you sleep. Extra pillows will not help.  Only take over-the-counter or prescription medicines as directed by your caregiver. SEEK IMMEDIATE MEDICAL CARE IF:  You have severe chest pain that radiates into your arm, neck, or jaw.  You feel sweaty, dizzy, or lightheaded.  You have shortness of breath.  You vomit blood.  You have difficulty or pain with swallowing.  You have bloody or black, tarry stools.  You have a fever.  You have a burning sensation in the chest more than 3 times a week for more than 2 weeks.  You cannot swallow, drink, or eat.  You drool because you cannot swallow your saliva. MAKE SURE YOU:  Understand these instructions.  Will watch your condition.  Will get help right away if you are not doing well or get worse. Document Released: 07/06/2004 Document Revised: 08/21/2011 Document Reviewed: 01/27/2011 [employer] Patient Information 2015 [place]  [employer]. This information is not intended to replace advice given to you by your health care provider. Make sure you discuss any questions you have with your health care provider.  

## 2014-03-03 DIAGNOSIS — K649 Unspecified hemorrhoids: Secondary | ICD-10-CM

## 2014-03-18 DIAGNOSIS — K645 Perianal venous thrombosis: Secondary | ICD-10-CM | POA: Diagnosis not present

## 2014-04-02 ENCOUNTER — Ambulatory Visit (INDEPENDENT_AMBULATORY_CARE_PROVIDER_SITE_OTHER): Payer: Medicare Other | Admitting: Neurology

## 2014-04-02 ENCOUNTER — Encounter: Payer: Self-pay | Admitting: Neurology

## 2014-04-02 VITALS — BP 133/79 | HR 76 | Ht 68.0 in | Wt 200.0 lb

## 2014-04-02 DIAGNOSIS — I639 Cerebral infarction, unspecified: Secondary | ICD-10-CM | POA: Diagnosis not present

## 2014-04-02 DIAGNOSIS — G44201 Tension-type headache, unspecified, intractable: Secondary | ICD-10-CM

## 2014-04-02 DIAGNOSIS — I10 Essential (primary) hypertension: Secondary | ICD-10-CM

## 2014-04-02 DIAGNOSIS — G44209 Tension-type headache, unspecified, not intractable: Secondary | ICD-10-CM | POA: Diagnosis not present

## 2014-04-02 MED ORDER — INDOMETHACIN 25 MG PO CAPS: 25.0000 mg | ORAL_CAPSULE | Freq: Two times a day (BID) | ORAL | Status: DC

## 2014-04-02 MED ORDER — NORTRIPTYLINE HCL 25 MG PO CAPS: ORAL_CAPSULE | ORAL | Status: DC

## 2014-04-02 NOTE — Progress Notes (Signed)
[facility]  PATIENT: Andre [name] DOB: 1941-03-23  Mr. Andre [name] returns for follow-up for his headaches. He was initially evaluated in 11/30/11 for headaches with a persistent right temporal headache.  He was drunk, fell in Novemeber of 2012 and had LOC and abrasion to his face on the right,. He has had intermittent headaches since that time lasting 15-20 minutes several times a week. He has been hypertensive for 20 years, is on  B/P meds does not know the name. Also has a hx of GERD and is on meds, does not know the name,    He had a CT scan of the brain in 07/2011, which showed small Left pontine lacunar infarct. MRI of the brain 12/27/11 with chronic left pontine lacunar ischemic infarction, and chronic SVD. Mild atrophy also noted. MRA of the head normal. MRA of the neck demonstarated right vertebral artery origin is mildly narrowed, may be due to atherosclerosis.  Bil internal carotid arteries have no stenosis.   Depakote ER 270m qhs has been very effective for his headache prevention, last clinical visit was with Carolyn March 2014, he has run out of medication for about 6 months now, now he has recurrent headaches, mild bilateral frontal pressure headaches,    He denies visual change, no significant gait difficulty  UPDATE March 16th 2015:  Since last visit, he continues to have frequent daily headaches, bilateral frontal, he is taking Depakote ER 500 mg without significant side effect, without much change of his headache either, he continued to drink occasionally, denies recent history of head trauma, denied gait difficulty, mild blurry vision when looking closeup object  UPDATE Oct 22nd 2015:  I have reviewed MRI of the brain in August 29 2013, periventricular small vessel disease, evidence of left and tentorium pontine lacunar infarction, no acute lesions,  He is taking amlodipine, lisinopril, daily aspirin, but no longer taking Depakote, has run out of  refills  He continued to complain almost constant right frontal area headaches, 8 out of 10, took Aleve, aspirin, without helping,  He continued to drink occasionally, smokes daily, lives alone, drive himself to clinic today  REVIEW OF SYSTEMS: Full 14 system review of systems performed and notable only for headaches, sleepiness, chest pain  ALLERGIES: No Known Allergies  HOME MEDICATIONS: Outpatient Prescriptions Prior to Visit  Medication Sig Dispense Refill  . amLODipine (NORVASC) 10 MG tablet take 1 tablet by mouth once daily  90 tablet  1  . aspirin EC 81 MG tablet Take 81 mg by mouth daily.       . famotidine (PEPCID) 20 MG tablet Take 1 tablet (20 mg total) by mouth 2 (two) times daily.  180 tablet  1  . lisinopril (PRINIVIL,ZESTRIL) 20 MG tablet take 1 tablet by mouth once daily  90 tablet  1  . omeprazole (PRILOSEC) 40 MG capsule Take 1 capsule (40 mg total) by mouth daily.  90 capsule  1   No facility-administered medications prior to visit.    PAST MEDICAL HISTORY: Past Medical History  Diagnosis Date  . Arthritis   . Hypertension   . Rectal cancer   . Weakness   . HA (headache)   . TIA (transient ischemic attack)   . Chest pain   . GERD (gastroesophageal reflux disease)     PAST SURGICAL HISTORY: Past Surgical History  Procedure Laterality Date  . Surgical excision of rectal cancer      FAMILY HISTORY: Family History  Problem Relation Age  of Onset  . Hypertension Mother   . Cancer Mother     SOCIAL HISTORY:  History   Social History  . Marital Status: Single    Spouse Name: N/A    Number of Children: 50  . Years of Education: 12   Occupational History  .      retired   Social History Main Topics  . Smoking status: Current Some Day Smoker -- 0.50 packs/day for 47 years    Types: Cigarettes  . Smokeless tobacco: Never Used  . Alcohol Use: 0.0 oz/week     Comment: heavy drinker, last alcohol was yesterday   . Drug Use: No  . Sexual  Activity: Not on file   Other Topics Concern  . Not on file   Social History Narrative   Patient is single, retired   Patient is right handed   Education level is high school   Caffeine consumption is 1 cup daily     PHYSICAL EXAM   Filed Vitals:   04/02/14 0903  BP: 133/79  Pulse: 76  Height: 5' 8"  (1.727 m)  Weight: 200 lb (90.719 kg)    Not recorded    Body mass index is 30.42 kg/(m^2).   Generalized: In no acute distress  Neck: Supple, no carotid bruits   Cardiac: Regular rate rhythm  Pulmonary: Clear to auscultation bilaterally  Musculoskeletal: No deformity  Neurological examination  Mentation: Alert oriented to time, place, history taking, and causual conversation, depressed looking elderly male, Mini-Mental Status Examination is 29 out of 30, missed 1 out of 3 recalls   Cranial nerve II-XII: Pupils were equal round reactive to light extraocular movements were full, Visual field were full on confrontational test. Bilateral fundi were sharp.  Facial sensation and strength were normal. Hearing was intact to finger rubbing bilaterally. Uvula tongue midline.  head turning and shoulder shrug and were normal and symmetric.Tongue protrusion into cheek strength was normal.  Motor: normal tone, bulk and strength.  Sensory: Intact to fine touch Coordination: Normal finger to nose, heel-to-shin bilaterally there was no truncal ataxia  Gait: Rising up from seated position without assistance, normal stance, mild difficulty performing tiptoe, heel and tandem walking Romberg signs: Negative  Deep tendon reflexes: Brachioradialis 2/2, biceps 2/2, triceps 2/2, patellar 2/2, Achilles  trace , plantar responses were flexor bilaterally.   DIAGNOSTIC DATA (LABS, IMAGING, TESTING) - I reviewed patient records, labs, notes, testing and imaging myself where available.  Lab Results  Component Value Date   WBC 5.6 02/23/2014   HGB 16.4 02/23/2014   HCT 51.0 02/23/2014   MCV  95.6 02/23/2014   PLT 218 01/14/2014      Component Value Date/Time   NA 140 01/14/2014 0633   K 3.9 01/14/2014 0633   CL 103 01/14/2014 0633   CO2 21 01/14/2014 0633   GLUCOSE 107* 01/14/2014 0633   BUN 4* 01/14/2014 0633   CREATININE 0.67 01/14/2014 0633   CREATININE 0.70 08/28/2013 1210   CALCIUM 9.2 01/14/2014 0633   PROT 7.2 08/28/2013 1210   ALBUMIN 4.2 08/28/2013 1210   AST 22 08/28/2013 1210   ALT 18 08/28/2013 1210   ALKPHOS 120* 08/28/2013 1210   BILITOT 0.9 08/28/2013 1210   GFRNONAA >90 01/14/2014 0633   GFRNONAA >89 08/28/2013 1210   GFRAA >90 01/14/2014 0633   GFRAA >89 08/28/2013 1210   Lab Results  Component Value Date   CHOL 143 08/22/2013   HDL 37* 08/22/2013   LDLCALC 83 08/22/2013  TRIG 114 08/22/2013   CHOLHDL 3.9 08/22/2013   Lab Results  Component Value Date   HGBA1C 6.0 02/23/2014   Lab Results  Component Value Date   VITAMINB12 258 02/23/2014   Lab Results  Component Value Date   TSH 1.130 08/25/2013      ASSESSMENT AND PLAN   73 years old [place] American male, with previous history of head trauma, vascular risk factor of hypertension, daily alcohol use, smoking, MRI showed small vessel disease, pontine lacunar infarction, presenting with persistent headaches,  1. ESR C-reactive protein to rule out temporal arteritis 2, today he complains of constant right-sided headaches, will try indomethacin 25 mg twice a day 3. Nortriptyline 25 mg, titrating to 50 mg every night as preventive medications, for chronic headaches, 4, return to clinic with nurse practitioner in one month  Marcial Pacas, M.D. Ph.D.  [facility] [address] [place], [place] [address] [phone]

## 2014-04-02 NOTE — Patient Instructions (Signed)
Over counter vitamin B12 1021mcg qday

## 2014-04-03 LAB — THYROID PANEL WITH TSH
Free Thyroxine Index: 2 (ref 1.2–4.9)
T3 Uptake Ratio: 29 % (ref 24–39)
T4, Total: 7 ug/dL (ref 4.5–12.0)
TSH: 1.14 u[IU]/mL (ref 0.450–4.500)

## 2014-04-03 LAB — SEDIMENTATION RATE: SED RATE: 2 mm/h (ref 0–30)

## 2014-04-03 LAB — C-REACTIVE PROTEIN: CRP: 5.1 mg/L — ABNORMAL HIGH (ref 0.0–4.9)

## 2014-04-07 DIAGNOSIS — I10 Essential (primary) hypertension: Secondary | ICD-10-CM | POA: Diagnosis not present

## 2014-04-07 DIAGNOSIS — F17200 Nicotine dependence, unspecified, uncomplicated: Secondary | ICD-10-CM | POA: Diagnosis not present

## 2014-04-07 DIAGNOSIS — R51 Headache: Secondary | ICD-10-CM | POA: Diagnosis not present

## 2014-04-07 DIAGNOSIS — K219 Gastro-esophageal reflux disease without esophagitis: Secondary | ICD-10-CM | POA: Diagnosis not present

## 2014-04-14 NOTE — Progress Notes (Signed)
Quick Note:  Called patient and spoke to him relayed labs and he understood. ______

## 2014-04-20 ENCOUNTER — Ambulatory Visit: Payer: Medicare Other | Admitting: Neurology

## 2014-04-21 ENCOUNTER — Other Ambulatory Visit: Payer: Self-pay | Admitting: Family Medicine

## 2014-05-05 ENCOUNTER — Encounter: Payer: Self-pay | Admitting: Adult Health

## 2014-05-05 ENCOUNTER — Ambulatory Visit (INDEPENDENT_AMBULATORY_CARE_PROVIDER_SITE_OTHER): Payer: Medicare Other | Admitting: Adult Health

## 2014-05-05 ENCOUNTER — Telehealth: Payer: Self-pay | Admitting: Adult Health

## 2014-05-05 VITALS — BP 135/83 | HR 90 | Temp 98.0°F | Ht 69.0 in | Wt 209.0 lb

## 2014-05-05 DIAGNOSIS — G44201 Tension-type headache, unspecified, intractable: Secondary | ICD-10-CM

## 2014-05-05 DIAGNOSIS — I639 Cerebral infarction, unspecified: Secondary | ICD-10-CM | POA: Diagnosis not present

## 2014-05-05 MED ORDER — NORTRIPTYLINE HCL 25 MG PO CAPS: 75.0000 mg | ORAL_CAPSULE | Freq: Every day | ORAL | Status: DC

## 2014-05-05 NOTE — Telephone Encounter (Signed)
I called back.  Spoke with Andre [name].  Verified Rx.  They will proceed with filling meds and will call back if anything further is needed.

## 2014-05-05 NOTE — Progress Notes (Signed)
PATIENT: Andre [name] DOB: [birth year]  REASON FOR VISIT: follow up HISTORY FROM: patient  HISTORY OF PRESENT ILLNESS: Andre [name] is a 73 year old male with a history of headaches. He returns today for follow-up. He is currently taking indomethacin and nortriptyline. He reports that his headaches have improved some. He now gets the headache about 3-4 times a week. He reports that his headaches are normally located in the temporal region. He states that his headaches can last 15 minutes to 1 hour and vary in severity. Denies nausea or vomiting. Denies photophobia and phonophobia.   HISTORY 04/02/14 Krista Blue): Andre [name] returns for follow-up for his headaches. He was initially evaluated in 11/30/11 for headaches with a persistent right temporal headache.He was drunk, fell in November of 2012 and had LOC and abrasion to his face on the right,. He has had intermittent headaches since that time lasting 15-20 minutes several times a week. He has been hypertensive for 20 years, is on  B/P meds does not know the name. Also has a hx of GERD and is on meds, does not know the name,  He had a CT scan of the brain in 07/2011, which showed small Left pontine lacunar infarct. MRI of the brain 12/27/11 with chronic left pontine lacunar ischemic infarction, and chronic SVD. Mild atrophy also noted. MRA of the head normal. MRA of the neck demonstarated right vertebral artery origin is mildly narrowed, may be due to atherosclerosis.  Bil internal carotid arteries have no stenosis. Depakote ER 250mg  qhs has been very effective for his headache prevention, last clinical visit was with Carolyn March 2014, he has run out of medication for about 6 months now, now he has recurrent headaches, mild bilateral frontal pressure headaches,He denies visual change, no significant gait difficulty UPDATE March 16th 2015: Since last visit, he continues to have frequent daily headaches, bilateral frontal, he is taking Depakote ER 500 mg without  significant side effect, without much change of his headache either, he continued to drink occasionally, denies recent history of head trauma, denied gait difficulty, mild blurry vision when looking closeup object UPDATE Oct 22nd 2015: I have reviewed MRI of the brain in August 29 2013, periventricular small vessel disease, evidence of left and tentorium pontine lacunar infarction, no acute lesions,He is taking amlodipine, lisinopril, daily aspirin, but no longer taking Depakote, has run out of refills, He continued to complain almost constant right frontal area headaches, 8 out of 10, took Aleve, aspirin, without helping, He continued to drink occasionally, smokes daily, lives alone, drive himself to clinic today  REVIEW OF SYSTEMS: Out of a complete 14 system review of symptoms, the patient complains only of the following symptoms, and all other reviewed systems are negative.  Blurred vision  headache  ALLERGIES: No Known Allergies  HOME MEDICATIONS: Outpatient Prescriptions Prior to Visit  Medication Sig Dispense Refill  . amLODipine (NORVASC) 10 MG tablet take 1 tablet by mouth once daily 90 tablet 1  . aspirin EC 81 MG tablet Take 81 mg by mouth daily.     . indomethacin (INDOCIN) 25 MG capsule Take 1 capsule (25 mg total) by mouth 2 (two) times daily. 60 capsule 3  . lisinopril (PRINIVIL,ZESTRIL) 20 MG tablet take 1 tablet by mouth once daily 90 tablet 1  . nortriptyline (PAMELOR) 25 MG capsule One po qhs xone week, then 2 tabs po qhs 60 capsule 6  . omeprazole (PRILOSEC) 40 MG capsule Take 1 capsule (40 mg total) by mouth daily.  90 capsule 1   No facility-administered medications prior to visit.    PAST MEDICAL HISTORY: Past Medical History  Diagnosis Date  . Arthritis   . Hypertension   . Rectal cancer   . Weakness   . HA (headache)   . TIA (transient ischemic attack)   . Chest pain   . GERD (gastroesophageal reflux disease)     PAST SURGICAL HISTORY: Past Surgical  History  Procedure Laterality Date  . Surgical excision of rectal cancer      FAMILY HISTORY: Family History  Problem Relation Age of Onset  . Hypertension Mother   . Cancer Mother     SOCIAL HISTORY: History   Social History  . Marital Status: Single    Spouse Name: N/A    Number of Children: 51  . Years of Education: 12   Occupational History  .      retired   Social History Main Topics  . Smoking status: Current Some Day Smoker -- 0.50 packs/day for 47 years    Types: Cigarettes  . Smokeless tobacco: Never Used  . Alcohol Use: 0.0 oz/week     Comment: heavy drinker, last alcohol was yesterday   . Drug Use: No  . Sexual Activity: Not on file   Other Topics Concern  . Not on file   Social History Narrative   Patient is single, retired   Patient is right handed   Education level is high school   Caffeine consumption is 1 cup daily      PHYSICAL EXAM  Filed Vitals:   05/05/14 0916  BP: 135/83  Pulse: 90  Temp: 98 F (36.7 C)  TempSrc: Oral  Height: 5\' 9"  (1.753 m)  Weight: 209 lb (94.802 kg)   Body mass index is 30.85 kg/(m^2). Generalized: Well developed, in no acute distress   Neurological examination  Mentation: Alert oriented to time, place, history taking. Follows all commands speech and language fluent Cranial nerve II-XII: Pupils were equal round reactive to light. Extraocular movements were full, visual field were full on confrontational test. Facial sensation and strength were normal. Uvula tongue midline. Head turning and shoulder shrug  were normal and symmetric. Motor: The motor testing reveals 5 over 5 strength of all 4 extremities. Good symmetric motor tone is noted throughout.  Sensory: Sensory testing is intact to soft touch on all 4 extremities. No evidence of extinction is noted.  Coordination: Cerebellar testing reveals good finger-nose-finger and heel-to-shin bilaterally.  Gait and station: Gait is normal. Tandem gait is normal.  Romberg is negative. No drift is seen.  Reflexes: Deep tendon reflexes are symmetric and normal bilaterally.    DIAGNOSTIC DATA (LABS, IMAGING, TESTING) - I reviewed patient records, labs, notes, testing and imaging myself where available.  Lab Results  Component Value Date   WBC 5.6 02/23/2014   HGB 16.4 02/23/2014   HCT 51.0 02/23/2014   MCV 95.6 02/23/2014   PLT 218 01/14/2014      Component Value Date/Time   NA 140 01/14/2014 0633   K 3.9 01/14/2014 0633   CL 103 01/14/2014 0633   CO2 21 01/14/2014 0633   GLUCOSE 107* 01/14/2014 0633   BUN 4* 01/14/2014 0633   CREATININE 0.67 01/14/2014 0633   CREATININE 0.70 08/28/2013 1210   CALCIUM 9.2 01/14/2014 0633   PROT 7.2 08/28/2013 1210   ALBUMIN 4.2 08/28/2013 1210   AST 22 08/28/2013 1210   ALT 18 08/28/2013 1210   ALKPHOS 120* 08/28/2013 1210  BILITOT 0.9 08/28/2013 1210   GFRNONAA >90 01/14/2014 0633   GFRNONAA >89 08/28/2013 1210   GFRAA >90 01/14/2014 0633   GFRAA >89 08/28/2013 1210   Lab Results  Component Value Date   CHOL 143 08/22/2013   HDL 37* 08/22/2013   LDLCALC 83 08/22/2013   TRIG 114 08/22/2013   CHOLHDL 3.9 08/22/2013   Lab Results  Component Value Date   HGBA1C 6.0 02/23/2014   Lab Results  Component Value Date   VITAMINB12 258 02/23/2014   Lab Results  Component Value Date   TSH 1.140 04/02/2014      ASSESSMENT AND PLAN 73 y.o. year old male  has a past medical history of Arthritis; Hypertension; Rectal cancer; Weakness; HA (headache); TIA (transient ischemic attack); Chest pain; and GERD (gastroesophageal reflux disease). here with:  1. Headache  The patient's headaches have improved with nortriptyline and indomethacin. However he continues to have 3-4 headaches a week. I will increase the nortriptyline to 75 mg at bedtime. If his headaches improved we will stop the indomethacin. I will call the patient in 1 month to check on the status of his headaches. If they have improved we  will stop the indomethacin. If the patient's symptoms worsen or he developed new symptoms he should let us know. Otherwise he will follow-up in 4 months or sooner if needed.   Ward Givens, MSN, NP-C 05/05/2014, 9:52 AM [facility] [address], [place], [place] [address] [phone]  Note: This document was prepared with digital dictation and possible smart phrase technology. Any transcriptional errors that result from this process are unintentional.

## 2014-05-05 NOTE — Patient Instructions (Signed)
Nortriptyline capsules What is this medicine? NORTRIPTYLINE (nor TRIP ti leen) is used to treat depression. This medicine may be used for other purposes; ask your health care provider or pharmacist if you have questions. COMMON BRAND NAME(S): Aventyl, Pamelor What should I tell my health care provider before I take this medicine? They need to know if you have any of these conditions: -an alcohol problem -bipolar disorder or schizophrenia -difficulty passing urine, prostate trouble -glaucoma -heart disease or recent heart attack -liver disease -over active thyroid -seizures -thoughts or plans of suicide or a previous suicide attempt or family history of suicide attempt -an unusual or allergic reaction to nortriptyline, other medicines, foods, dyes, or preservatives -pregnant or trying to get pregnant -breast-feeding How should I use this medicine? Take this medicine by mouth with a glass of water. Follow the directions on the prescription label. Take your doses at regular intervals. Do not take it more often than directed. Do not stop taking this medicine suddenly except upon the advice of your doctor. Stopping this medicine too quickly may cause serious side effects or your condition may worsen. A special MedGuide will be given to you by the pharmacist with each prescription and refill. Be sure to read this information carefully each time. Talk to your pediatrician regarding the use of this medicine in children. Special care may be needed. Overdosage: If you think you have taken too much of this medicine contact a poison control center or emergency room at once. NOTE: This medicine is only for you. Do not share this medicine with others. What if I miss a dose? If you miss a dose, take it as soon as you can. If it is almost time for your next dose, take only that dose. Do not take double or extra doses. What may interact with this medicine? Do not take this medicine with any of the  following medications: -arsenic trioxide -certain medicines medicines for irregular heart beat -cisapride -halofantrine -linezolid -MAOIs like Carbex, Eldepryl, Marplan, Nardil, and Parnate -methylene blue (injected into a vein) -other medicines for mental depression -phenothiazines like perphenazine, thioridazine and chlorpromazine -pimozide -probucol -procarbazine -sparfloxacin -St. John's Wort -ziprasidone This medicine may also interact with any of the following medications: -atropine and related drugs like hyoscyamine, scopolamine, tolterodine and others -barbiturate medicines for inducing sleep or treating seizures, such as phenobarbital -cimetidine -medicines for diabetes -medicines for seizures like carbamazepine or phenytoin -reserpine -thyroid medicine This list may not describe all possible interactions. Give your health care provider a list of all the medicines, herbs, non-prescription drugs, or dietary supplements you use. Also tell them if you smoke, drink alcohol, or use illegal drugs. Some items may interact with your medicine. What should I watch for while using this medicine? Tell your doctor if your symptoms do not get better or if they get worse. Visit your doctor or health care professional for regular checks on your progress. Because it may take several weeks to see the full effects of this medicine, it is important to continue your treatment as prescribed by your doctor. Patients and their families should watch out for new or worsening thoughts of suicide or depression. Also watch out for sudden changes in feelings such as feeling anxious, agitated, panicky, irritable, hostile, aggressive, impulsive, severely restless, overly excited and hyperactive, or not being able to sleep. If this happens, especially at the beginning of treatment or after a change in dose, call your health care professional. You may get drowsy or dizzy. Do not drive,   use machinery, or do  anything that needs mental alertness until you know how this medicine affects you. Do not stand or sit up quickly, especially if you are an older patient. This reduces the risk of dizzy or fainting spells. Alcohol may interfere with the effect of this medicine. Avoid alcoholic drinks. Do not treat yourself for coughs, colds, or allergies without asking your doctor or health care professional for advice. Some ingredients can increase possible side effects. Your mouth may get dry. Chewing sugarless gum or sucking hard candy, and drinking plenty of water may help. Contact your doctor if the problem does not go away or is severe. This medicine may cause dry eyes and blurred vision. If you wear contact lenses you may feel some discomfort. Lubricating drops may help. See your eye doctor if the problem does not go away or is severe. This medicine can cause constipation. Try to have a bowel movement at least every 2 to 3 days. If you do not have a bowel movement for 3 days, call your doctor or health care professional. This medicine can make you more sensitive to the sun. Keep out of the sun. If you cannot avoid being in the sun, wear protective clothing and use sunscreen. Do not use sun lamps or tanning beds/booths. What side effects may I notice from receiving this medicine? Side effects that you should report to your doctor or health care professional as soon as possible: -allergic reactions like skin rash, itching or hives, swelling of the face, lips, or tongue -abnormal production of milk in females -breast enlargement in both males and females -breathing problems -confusion, hallucinations -fever with increased sweating -irregular or fast, pounding heartbeat -muscle stiffness, or spasms -pain or difficulty passing urine, loss of bladder control -seizures -suicidal thoughts or other mood changes -swelling of the testicles -tingling, pain, or numbness in the feet or hands -yellowing of the eyes or  skin Side effects that usually do not require medical attention (report to your doctor or health care professional if they continue or are bothersome): -change in sex drive or performance -diarrhea -nausea, vomiting -weight gain or loss This list may not describe all possible side effects. Call your doctor for medical advice about side effects. You may report side effects to FDA at 1-800-FDA-1088. Where should I keep my medicine? Keep out of the reach of children. Store at room temperature between 15 and 30 degrees C (59 and 86 degrees F). Keep container tightly closed. Throw away any unused medicine after the expiration date. NOTE: This sheet is a summary. It may not cover all possible information. If you have questions about this medicine, talk to your doctor, pharmacist, or health care provider.  2015, [employer]. (2011-10-16 13:57:12)

## 2014-05-05 NOTE — Telephone Encounter (Signed)
Baxter Flattery with [employer] @ [phone], questioning instructions for Rx nortriptyline (PAMELOR) 25 MG capsule.  Please call and advise.

## 2014-05-12 NOTE — Progress Notes (Signed)
I agree above plan. 

## 2014-05-25 ENCOUNTER — Telehealth: Payer: Self-pay | Admitting: *Deleted

## 2014-05-25 NOTE — Telephone Encounter (Signed)
Per patient got flu vaccine about 2 weeks ago at an office near [facility] [place].

## 2014-05-26 ENCOUNTER — Encounter: Payer: Self-pay | Admitting: Cardiovascular Disease

## 2014-05-28 ENCOUNTER — Ambulatory Visit (INDEPENDENT_AMBULATORY_CARE_PROVIDER_SITE_OTHER): Payer: Medicare Other | Admitting: Family Medicine

## 2014-05-28 ENCOUNTER — Ambulatory Visit (INDEPENDENT_AMBULATORY_CARE_PROVIDER_SITE_OTHER): Payer: Medicare Other

## 2014-05-28 VITALS — BP 122/68 | HR 84 | Temp 98.1°F | Resp 18 | Ht 71.0 in | Wt 206.0 lb

## 2014-05-28 DIAGNOSIS — R05 Cough: Secondary | ICD-10-CM

## 2014-05-28 DIAGNOSIS — I639 Cerebral infarction, unspecified: Secondary | ICD-10-CM

## 2014-05-28 DIAGNOSIS — B353 Tinea pedis: Secondary | ICD-10-CM | POA: Diagnosis not present

## 2014-05-28 DIAGNOSIS — R059 Cough, unspecified: Secondary | ICD-10-CM

## 2014-05-28 DIAGNOSIS — R0600 Dyspnea, unspecified: Secondary | ICD-10-CM

## 2014-05-28 DIAGNOSIS — J209 Acute bronchitis, unspecified: Secondary | ICD-10-CM | POA: Diagnosis not present

## 2014-05-28 DIAGNOSIS — S91302A Unspecified open wound, left foot, initial encounter: Secondary | ICD-10-CM | POA: Diagnosis not present

## 2014-05-28 MED ORDER — MUPIROCIN 2 % EX OINT: 1.0000 "application " | TOPICAL_OINTMENT | Freq: Three times a day (TID) | CUTANEOUS | Status: DC

## 2014-05-28 MED ORDER — CLOTRIMAZOLE 1 % EX CREA: 1.0000 "application " | TOPICAL_CREAM | Freq: Two times a day (BID) | CUTANEOUS | Status: DC

## 2014-05-28 MED ORDER — MUPIROCIN 2 % EX OINT: 1.0000 "application " | TOPICAL_OINTMENT | Freq: Two times a day (BID) | CUTANEOUS | Status: DC

## 2014-05-28 MED ORDER — AZITHROMYCIN 250 MG PO TABS: ORAL_TABLET | ORAL | Status: DC

## 2014-05-28 NOTE — Progress Notes (Addendum)
Subjective:  This chart was scribed for [name], MD by Dellis Filbert, ED Scribe at [facility].The patient was seen in exam room 10 and the patient's care was started at 8:59 AM.   Patient ID: Andre [name], male    DOB: 1940-11-17, 73 y.o.   MRN: [id number] Chief Complaint  Patient presents with  . Cough    white mucous x2 weeks   . chest congestion  . spot on toe    left foot x1 mth    HPI HPI Comments: Andre [name] is a 73 y.o. male who presents to [facility] complaining of cough and chest congestion, onset 2 weeks ago. His cough is producing a white sputum. His cough and chest congestion typically bothers him 4-5 am in the morning. He has rhinorrhea as an associated symptom. He denies fever, blood in his sputum and wheezing. Has no history of asthma and bronchitis.   Pt states he does have SOB, onset 2 months ago. Which he typically feels with movement. He describes the pain as burning. He is taking his Prilosec, but not consistently. Pt denies CP, heart burn. He does not see a cardiologist.   Pt also complains of a spot on his left second toe, onset 1 week ago. Pt first noticed blood on his sock. He has not bought new shoes recently. He has put alcohol on it for relief. Pt states he did not know what to put on it. He denies any recent injury to his toes.  Pt does not work, he recently retired. Pt is a smoker, for about 24 pack years. He currently smokes 1/3 pack a day.  Patient Active Problem List   Diagnosis Date Noted  . Hemorrhoids 02/11/2014  . Constipation 05/30/2013  . 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 Headache 10/23/2011  . Rectal cancer 10/23/2011  . GERD (gastroesophageal reflux disease) 10/23/2011  . Nicotine addiction 10/23/2011  . 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 Cervical stenosis of spinal canal 10/23/2011  . Essential hypertension, benign 07/10/2011   Past Medical History  Diagnosis Date  . Arthritis   . Hypertension   . Rectal  cancer   . Weakness   . HA (headache)   . TIA (transient ischemic attack)   . Chest pain   . GERD (gastroesophageal reflux disease)    Past Surgical History  Procedure Laterality Date  . Surgical excision of rectal cancer     No Known Allergies Prior to Admission medications   Medication Sig Start Date End Date Taking? Authorizing Provider  amLODipine (NORVASC) 10 MG tablet take 1 tablet by mouth once daily 01/20/14  Yes Shawnee Knapp, MD  aspirin EC 81 MG tablet Take 81 mg by mouth daily.    Yes Historical Provider, MD  indomethacin (INDOCIN) 25 MG capsule Take 1 capsule (25 mg total) by mouth 2 (two) times daily. 04/02/14  Yes Marcial Pacas, MD  lisinopril (PRINIVIL,ZESTRIL) 20 MG tablet take 1 tablet by mouth once daily 01/20/14  Yes Shawnee Knapp, MD  omeprazole (PRILOSEC) 40 MG capsule Take 1 capsule (40 mg total) by mouth daily. 02/23/14  Yes Darlyne Russian, MD  nortriptyline (PAMELOR) 25 MG capsule Take 3 capsules (75 mg total) by mouth at bedtime. One po qhs xone week, then 2 tabs po qhs Patient not taking: Reported on 05/28/2014 05/05/14   Ward Givens, NP   History   Social History  . Marital Status: Single    Spouse Name: N/A  Number of Children: 11  . Years of Education: 12   Occupational History  .      retired   Social History Main Topics  . Smoking status: Current Some Day Smoker -- 0.50 packs/day for 47 years    Types: Cigarettes  . Smokeless tobacco: Never Used  . Alcohol Use: 0.0 oz/week     Comment: heavy drinker, last alcohol was yesterday   . Drug Use: No  . Sexual Activity: Not on file   Other Topics Concern  . Not on file   Social History Narrative   Patient is single, retired   Patient is right handed   Education level is high school   Caffeine consumption is 1 cup daily   Review of Systems  Constitutional: Negative for fever.  HENT: Positive for congestion and rhinorrhea.   Respiratory: Positive for cough and shortness of breath. Negative for  wheezing.   Cardiovascular: Negative for chest pain.  Skin: Positive for wound.      Objective:  BP 122/68 mmHg  Pulse 84  Temp(Src) 98.1 F (36.7 C) (Oral)  Resp 18  Ht 5\' 11"  (1.803 m)  Wt 206 lb (93.441 kg)  BMI 28.74 kg/m2  SpO2 100%  Physical Exam  Constitutional: He is oriented to person, place, and time. He appears well-developed and well-nourished.  HENT:  Head: Normocephalic and atraumatic.  Right Ear: Tympanic membrane, external ear and ear canal normal.  Left Ear: Tympanic membrane, external ear and ear canal normal.  Nose: No rhinorrhea.  Mouth/Throat: Oropharynx is clear and moist and mucous membranes are normal. No oropharyngeal exudate or posterior oropharyngeal erythema.  Eyes: Conjunctivae and EOM are normal. Pupils are equal, round, and reactive to light.  Neck: Normal range of motion. Neck supple.  Cardiovascular: Normal rate, regular rhythm, normal heart sounds and intact distal pulses.   No murmur heard. Pulmonary/Chest: Effort normal and breath sounds normal. He has no wheezes. He has no rhonchi. He has no rales.  Abdominal: Soft. There is no tenderness.  Musculoskeletal: Normal range of motion.  Lymphadenopathy:    He has no cervical adenopathy.  Neurological: He is alert and oriented to person, place, and time.  Skin: Skin is warm and dry. No rash noted.  Approximately 1 cm abrasion with a rupture blistered and bleeding at the base but no significant erythema or warmth. Wet appearing skin in between the interdigital toes. Slight hammer toe at the second left toe.  Psychiatric: He has a normal mood and affect. His behavior is normal.  Nursing note and vitals reviewed.  [facility] reading (PRIMARY) by  Dr. Carlota Raspberry: CXR: increased bronchitic markings LLL greater than RLL.     Assessment & Plan:   Andre [name] is a 73 y.o. male Cough, Acute bronchitis, unspecified organism - Plan: azithromycin (ZITHROMAX) 250 MG tablet  - Plan: DG Chest 2 View, azithromycin  (ZITHROMAX) 250 MG tablet  -possible acute on chronic bronchitis with tobacco use history.  Afebrile, no wheeze and normal O2 sat. Start Zpak, mucinex if not causing wheeze, and if any wheeze, worsening , or not improved in next week - rtc or ER.   -restart PPI as this may also be component to cough.   Dyspnea - Plan: DG Chest 2 View  - possible COPD, but no wheeze at present. Start zpak, and if sx's persist - rtc for spirometry. If any chest pain/tightness - ER/911 precautions.  Tinea pedis, unspecified laterality - Plan: clotrimazole (LOTRIMIN) 1 % cream BID, foot care  reviewed.    Wound, open, foot, left, initial encounter - Plan: mupirocin ointment (BACTROBAN) 2 %, DISCONTINUED: mupirocin ointment (BACTROBAN) 2 % - suspect friction blister. Apply bactroban, bandage to protect from friction. RTC precautions.    Meds ordered this encounter  Medications  . clotrimazole (LOTRIMIN) 1 % cream    Sig: Apply 1 application topically 2 (two) times daily. Between toes for next 2 weeks.    Dispense:  30 g    Refill:  0  . DISCONTD: mupirocin ointment (BACTROBAN) 2 %    Sig: Place 1 application into the nose 2 (two) times daily.    Dispense:  22 g    Refill:  0  . mupirocin ointment (BACTROBAN) 2 %    Sig: Apply 1 application topically 3 (three) times daily. For 1 week.    Dispense:  22 g    Refill:  0  . azithromycin (ZITHROMAX) 250 MG tablet    Sig: Take 2 pills by mouth on day 1, then 1 pill by mouth per day on days 2 through 5.    Dispense:  6 tablet    Refill:  0   Patient Instructions  Apply mupirocin ointment to wound on toe for next 1 week. Keep covered with bandage or corn/callous pad to keep shoe from rubbing on this area.  Clotrimazole between toes for "athletes foot" let feet breathe throughout day, cotton socks.  Mupirocin to top of toe wound 3 times per day for next week. recheck if this area is not improving.  Start antibiotic for cough, mucinex if needed to bring up phlegm,  but if you start to wheeze, or any worsening of shortness of breath - return to clinic for evaluation. If the cough does not improve in the next week - return for recheck .  Restart omeprazole once per day. If burning in chest does not improve in next week - return for recheck. See foods to avoid below.  Return to the clinic or go to the nearest emergency room if any of your symptoms worsen or new symptoms occur.   Food Choices for Gastroesophageal Reflux Disease When you have gastroesophageal reflux disease (GERD), the foods you eat and your eating habits are very important. Choosing the right foods can help ease the discomfort of GERD. WHAT GENERAL GUIDELINES DO I NEED TO FOLLOW?  Choose fruits, vegetables, whole grains, low-fat dairy products, and low-fat meat, fish, and poultry.  Limit fats such as oils, salad dressings, butter, nuts, and avocado.  Keep a food diary to identify foods that cause symptoms.  Avoid foods that cause reflux. These may be different for different people.  Eat frequent small meals instead of three large meals each day.  Eat your meals slowly, in a relaxed setting.  Limit fried foods.  Cook foods using methods other than frying.  Avoid drinking alcohol.  Avoid drinking large amounts of liquids with your meals.  Avoid bending over or lying down until 2-3 hours after eating. WHAT FOODS ARE NOT RECOMMENDED? The following are some foods and drinks that may worsen your symptoms: Vegetables Tomatoes. Tomato juice. Tomato and spaghetti sauce. Chili peppers. Onion and garlic. Horseradish. Fruits Oranges, grapefruit, and lemon (fruit and juice). Meats High-fat meats, fish, and poultry. This includes hot dogs, ribs, ham, sausage, salami, and bacon. Dairy Whole milk and chocolate milk. Sour cream. Cream. Butter. Ice cream. Cream cheese.  Beverages Coffee and tea, with or without caffeine. Carbonated beverages or energy drinks. Condiments Hot sauce. Barbecue  sauce.  Sweets/Desserts Chocolate and cocoa. Donuts. Peppermint and spearmint. Fats and Oils High-fat foods, including [place] fries and potato chips. Other Vinegar. Strong spices, such as black pepper, white pepper, red pepper, cayenne, curry powder, cloves, ginger, and chili powder. The items listed above may not be a complete list of foods and beverages to avoid. Contact your dietitian for more information. Document Released: 05/29/2005 Document Revised: 06/03/2013 Document Reviewed: 04/02/2013 [facility] Patient Information 2015 [place], [place]. This information is not intended to replace advice given to you by your health care provider. Make sure you discuss any questions you have with your health care provider.     I personally performed the services described in this documentation, which was scribed in my presence. The recorded information has been reviewed and considered, and addended by me as needed.

## 2014-05-28 NOTE — Patient Instructions (Addendum)
Apply mupirocin ointment to wound on toe for next 1 week. Keep covered with bandage or corn/callous pad to keep shoe from rubbing on this area.  Clotrimazole between toes for "athletes foot" let feet breathe throughout day, cotton socks.  Mupirocin to top of toe wound 3 times per day for next week. recheck if this area is not improving.  Start antibiotic for cough, mucinex if needed to bring up phlegm, but if you start to wheeze, or any worsening of shortness of breath - return to clinic for evaluation. If the cough does not improve in the next week - return for recheck .  Restart omeprazole once per day. If burning in chest does not improve in next week - return for recheck. See foods to avoid below.  Return to the clinic or go to the nearest emergency room if any of your symptoms worsen or new symptoms occur.   Food Choices for Gastroesophageal Reflux Disease When you have gastroesophageal reflux disease (GERD), the foods you eat and your eating habits are very important. Choosing the right foods can help ease the discomfort of GERD. WHAT GENERAL GUIDELINES DO I NEED TO FOLLOW?  Choose fruits, vegetables, whole grains, low-fat dairy products, and low-fat meat, fish, and poultry.  Limit fats such as oils, salad dressings, butter, nuts, and avocado.  Keep a food diary to identify foods that cause symptoms.  Avoid foods that cause reflux. These may be different for different people.  Eat frequent small meals instead of three large meals each day.  Eat your meals slowly, in a relaxed setting.  Limit fried foods.  Cook foods using methods other than frying.  Avoid drinking alcohol.  Avoid drinking large amounts of liquids with your meals.  Avoid bending over or lying down until 2-3 hours after eating. WHAT FOODS ARE NOT RECOMMENDED? The following are some foods and drinks that may worsen your symptoms: Vegetables Tomatoes. Tomato juice. Tomato and spaghetti sauce. Chili peppers.  Onion and garlic. Horseradish. Fruits Oranges, grapefruit, and lemon (fruit and juice). Meats High-fat meats, fish, and poultry. This includes hot dogs, ribs, ham, sausage, salami, and bacon. Dairy Whole milk and chocolate milk. Sour cream. Cream. Butter. Ice cream. Cream cheese.  Beverages Coffee and tea, with or without caffeine. Carbonated beverages or energy drinks. Condiments Hot sauce. Barbecue sauce.  Sweets/Desserts Chocolate and cocoa. Donuts. Peppermint and spearmint. Fats and Oils High-fat foods, including [place] fries and potato chips. Other Vinegar. Strong spices, such as black pepper, white pepper, red pepper, cayenne, curry powder, cloves, ginger, and chili powder. The items listed above may not be a complete list of foods and beverages to avoid. Contact your dietitian for more information. Document Released: 05/29/2005 Document Revised: 06/03/2013 Document Reviewed: 04/02/2013 [facility] Patient Information 2015 [place], [place]. This information is not intended to replace advice given to you by your health care provider. Make sure you discuss any questions you have with your health care provider.

## 2014-06-13 ENCOUNTER — Ambulatory Visit (INDEPENDENT_AMBULATORY_CARE_PROVIDER_SITE_OTHER): Payer: Medicare Other | Admitting: Family Medicine

## 2014-06-13 VITALS — BP 138/64 | HR 77 | Temp 97.7°F | Resp 16 | Ht 71.0 in | Wt 204.0 lb

## 2014-06-13 DIAGNOSIS — Z720 Tobacco use: Secondary | ICD-10-CM | POA: Diagnosis not present

## 2014-06-13 DIAGNOSIS — R0789 Other chest pain: Secondary | ICD-10-CM

## 2014-06-13 DIAGNOSIS — F17200 Nicotine dependence, unspecified, uncomplicated: Secondary | ICD-10-CM

## 2014-06-13 DIAGNOSIS — R059 Cough, unspecified: Secondary | ICD-10-CM

## 2014-06-13 DIAGNOSIS — R05 Cough: Secondary | ICD-10-CM | POA: Diagnosis not present

## 2014-06-13 MED ORDER — PREDNISONE 20 MG PO TABS: 20.0000 mg | ORAL_TABLET | Freq: Every day | ORAL | Status: DC

## 2014-06-13 MED ORDER — AMOXICILLIN 875 MG PO TABS: 875.0000 mg | ORAL_TABLET | Freq: Two times a day (BID) | ORAL | Status: DC

## 2014-06-13 MED ORDER — HYDROCODONE-HOMATROPINE 5-1.5 MG/5ML PO SYRP: 5.0000 mL | ORAL_SOLUTION | ORAL | Status: DC | PRN

## 2014-06-13 NOTE — Progress Notes (Signed)
Subjective: Patient was here 2 weeks ago with a cough. He bacteria again today complaining of a cough with coughing up white phlegm. He does not feel particularly sick. He said he didn't do any better after the treatment he was given a couple weeks ago. It turns out he got a Z-Pak then. He does continue to smoke. He usually has a pack of cigarettes last day and a half, but now is only smoking 1 or 2 cigarettes a day he says.  Is not until the end of the exam the he said he didn't sleep last night. He complained of of just a discomfort in his chest. This morning was laying down. He says he has the reflux but takes the medicine, omeprazole.  Objective: TMs are normal. Throat clear. Neck supple without significant nodes. Chest clear to all station. Heart regular without murmurs gallops or arrhythmias.  Assessment: Cough GERD Chest discomfort  Plan: At are written prescriptions for the cough syrup, amoxicillin, and Hycodan before he complained of the chest discomfort. Will check an EKG to make sure that's okay. Certainly his other symptoms did not sound cardiac.  EKG is unchanged from before. There are some nonspecific T-wave and inversions persisting in V2 and 3.  I think the chest discomfort is from the coughing.

## 2014-06-13 NOTE — Patient Instructions (Signed)
Take the prednisone pills 3 daily for 2 days, then 2 daily for 2 days, then 1 daily for 2 days. The bottle says take one daily but that is wrong.  Take the cough syrup 1 teaspoon every 4-6 hours as needed during the evening and nighttime.  Drink plenty of fluids and get enough rest  Take the amoxicillin one pill twice daily  Return if not improving  Stop smoking all cigarettes.

## 2014-06-14 ENCOUNTER — Emergency Department (HOSPITAL_COMMUNITY): Payer: Medicare Other

## 2014-06-14 ENCOUNTER — Emergency Department (HOSPITAL_COMMUNITY)
Admission: EM | Admit: 2014-06-14 | Discharge: 2014-06-14 | Disposition: A | Discharge status: Home / Self Care | Payer: Medicare Other | Attending: Emergency Medicine | Admitting: Emergency Medicine

## 2014-06-14 ENCOUNTER — Encounter (HOSPITAL_COMMUNITY): Payer: Self-pay | Admitting: Emergency Medicine

## 2014-06-14 DIAGNOSIS — K219 Gastro-esophageal reflux disease without esophagitis: Secondary | ICD-10-CM | POA: Insufficient documentation

## 2014-06-14 DIAGNOSIS — Z7982 Long term (current) use of aspirin: Secondary | ICD-10-CM | POA: Diagnosis not present

## 2014-06-14 DIAGNOSIS — Z720 Tobacco use: Secondary | ICD-10-CM | POA: Diagnosis not present

## 2014-06-14 DIAGNOSIS — Z792 Long term (current) use of antibiotics: Secondary | ICD-10-CM | POA: Diagnosis not present

## 2014-06-14 DIAGNOSIS — Z7952 Long term (current) use of systemic steroids: Secondary | ICD-10-CM | POA: Insufficient documentation

## 2014-06-14 DIAGNOSIS — I10 Essential (primary) hypertension: Secondary | ICD-10-CM | POA: Diagnosis not present

## 2014-06-14 DIAGNOSIS — Z79899 Other long term (current) drug therapy: Secondary | ICD-10-CM | POA: Insufficient documentation

## 2014-06-14 DIAGNOSIS — Z8673 Personal history of transient ischemic attack (TIA), and cerebral infarction without residual deficits: Secondary | ICD-10-CM | POA: Insufficient documentation

## 2014-06-14 DIAGNOSIS — Z85048 Personal history of other malignant neoplasm of rectum, rectosigmoid junction, and anus: Secondary | ICD-10-CM | POA: Diagnosis not present

## 2014-06-14 DIAGNOSIS — R0789 Other chest pain: Secondary | ICD-10-CM | POA: Diagnosis not present

## 2014-06-14 DIAGNOSIS — M1990 Unspecified osteoarthritis, unspecified site: Secondary | ICD-10-CM | POA: Insufficient documentation

## 2014-06-14 DIAGNOSIS — R109 Unspecified abdominal pain: Secondary | ICD-10-CM | POA: Diagnosis not present

## 2014-06-14 DIAGNOSIS — R079 Chest pain, unspecified: Secondary | ICD-10-CM

## 2014-06-14 LAB — CBC WITH DIFFERENTIAL/PLATELET
BASOS ABS: 0 10*3/uL (ref 0.0–0.1)
BASOS PCT: 0 % (ref 0–1)
EOS PCT: 3 % (ref 0–5)
Eosinophils Absolute: 0.2 10*3/uL (ref 0.0–0.7)
HCT: 46.3 % (ref 39.0–52.0)
HEMOGLOBIN: 15.6 g/dL (ref 13.0–17.0)
Lymphocytes Relative: 44 % (ref 12–46)
Lymphs Abs: 3.1 10*3/uL (ref 0.7–4.0)
MCH: 31.1 pg (ref 26.0–34.0)
MCHC: 33.7 g/dL (ref 30.0–36.0)
MCV: 92.2 fL (ref 78.0–100.0)
MONO ABS: 0.5 10*3/uL (ref 0.1–1.0)
Monocytes Relative: 7 % (ref 3–12)
NEUTROS ABS: 3.2 10*3/uL (ref 1.7–7.7)
Neutrophils Relative %: 46 % (ref 43–77)
Platelets: 239 10*3/uL (ref 150–400)
RBC: 5.02 MIL/uL (ref 4.22–5.81)
RDW: 13.8 % (ref 11.5–15.5)
WBC: 6.9 10*3/uL (ref 4.0–10.5)

## 2014-06-14 LAB — COMPREHENSIVE METABOLIC PANEL
ALK PHOS: 132 U/L — AB (ref 39–117)
ALT: 38 U/L (ref 0–53)
ANION GAP: 11 (ref 5–15)
AST: 55 U/L — ABNORMAL HIGH (ref 0–37)
Albumin: 4.1 g/dL (ref 3.5–5.2)
BILIRUBIN TOTAL: 0.9 mg/dL (ref 0.3–1.2)
CO2: 23 mmol/L (ref 19–32)
CREATININE: 0.79 mg/dL (ref 0.50–1.35)
Calcium: 9.1 mg/dL (ref 8.4–10.5)
Chloride: 101 mEq/L (ref 96–112)
GFR calc non Af Amer: 88 mL/min — ABNORMAL LOW (ref >90)
GLUCOSE: 118 mg/dL — AB (ref 70–99)
POTASSIUM: 3.5 mmol/L (ref 3.5–5.1)
Sodium: 135 mmol/L (ref 135–145)
Total Protein: 8 g/dL (ref 6.0–8.3)

## 2014-06-14 LAB — LIPASE, BLOOD: Lipase: 22 U/L (ref 11–59)

## 2014-06-14 LAB — TROPONIN I: Troponin I: <0.03 ng/mL (ref <0.031)

## 2014-06-14 MED ORDER — GI COCKTAIL ~~LOC~~
30.0000 mL | Freq: Once | ORAL | Status: AC
  Administered 2014-06-14: 30 mL via ORAL
  Filled 2014-06-14: qty 30

## 2014-06-14 MED ORDER — ALBUTEROL SULFATE HFA 108 (90 BASE) MCG/ACT IN AERS
1.0000 | INHALATION_SPRAY | Freq: Once | RESPIRATORY_TRACT | Status: AC
  Administered 2014-06-14: 2 via RESPIRATORY_TRACT
  Filled 2014-06-14: qty 6.7

## 2014-06-14 NOTE — ED Notes (Signed)
Pt reports midsternal chest pain x3 weeks with sob.  Pt denies n/v/d, dizziness, diaphoresis.  Pt was seen recently for chest pain and was given amoxicillin.  Pulses intact.  Pt alert and oriented.

## 2014-06-14 NOTE — Discharge Instructions (Signed)
Please follow up with your primary care physician in 1-2 days. If you do not have one please call the [place] number listed above. Please continue to use your medications prescribed to you by your primary care doctor. Please use your inhaler 1-2 puffs every four to six hours as needed for cough, shortness of breath or wheezing. Please read all discharge instructions and return precautions.    Chest Pain Observation It is often hard to give a specific diagnosis for the cause of chest pain. Among other possibilities your symptoms might be caused by inadequate oxygen delivery to your heart (angina). Angina that is not treated or evaluated can lead to a heart attack (myocardial infarction) or death. Blood tests, electrocardiograms, and X-rays may have been done to help determine a possible cause of your chest pain. After evaluation and observation, your health care provider has determined that it is unlikely your pain was caused by an unstable condition that requires hospitalization. However, a full evaluation of your pain may need to be completed, with additional diagnostic testing as directed. It is very important to keep your follow-up appointments. Not keeping your follow-up appointments could result in permanent heart damage, disability, or death. If there is any problem keeping your follow-up appointments, you must call your health care provider. HOME CARE INSTRUCTIONS  Due to the slight chance that your pain could be angina, it is important to follow your health care provider's treatment plan and also maintain a healthy lifestyle:  Maintain or work toward achieving a healthy weight.  Stay physically active and exercise regularly.  Decrease your salt intake.  Eat a balanced, healthy diet. Talk to a dietitian to learn about heart-healthy foods.  Increase your fiber intake by including whole grains, vegetables, fruits, and nuts in your diet.  Avoid situations that cause  stress, anger, or depression.  Take medicines as advised by your health care provider. Report any side effects to your health care provider. Do not stop medicines or adjust the dosages on your own.  Quit smoking. Do not use nicotine patches or gum until you check with your health care provider.  Keep your blood pressure, blood sugar, and cholesterol levels within normal limits.  Limit alcohol intake to no more than 1 drink per day for women who are not pregnant and 2 drinks per day for men.  Do not abuse drugs. SEEK IMMEDIATE MEDICAL CARE IF: You have severe chest pain or pressure which may include symptoms such as:  You feel pain or pressure in your arms, neck, jaw, or back.  You have severe back or abdominal pain, feel sick to your stomach (nauseous), or throw up (vomit).  You are sweating profusely.  You are having a fast or irregular heartbeat.  You feel short of breath while at rest.  You notice increasing shortness of breath during rest, sleep, or with activity.  You have chest pain that does not get better after rest or after taking your usual medicine.  You wake from sleep with chest pain.  You are unable to sleep because you cannot breathe.  You develop a frequent cough or you are coughing up blood.  You feel dizzy, faint, or experience extreme fatigue.  You develop severe weakness, dizziness, fainting, or chills. Any of these symptoms may represent a serious problem that is an emergency. Do not wait to see if the symptoms will go away. Call your local emergency services (911 in the [place] Do not drive yourself to the  hospital. MAKE SURE YOU:  Understand these instructions.  Will watch your condition.  Will get help right away if you are not doing well or get worse. Document Released: 07/01/2010 Document Revised: 06/03/2013 Document Reviewed: 11/28/2012 [facility] Patient Information 2015 [place], [place]. This information is not intended to replace advice given to  you by your health care provider. Make sure you discuss any questions you have with your health care provider.

## 2014-06-14 NOTE — ED Provider Notes (Signed)
CSN: [id number]     Arrival date & time 06/14/14  1402 History   First MD Initiated Contact with Patient 06/14/14 1658     Chief Complaint  Patient presents with  . Chest Pain  . Abdominal Pain     (Consider location/radiation/quality/duration/timing/severity/associated sxs/prior Treatment) HPI Comments: Patient is a 74 yo M PMHx significant for HTN, TIA, GERD, tobacco abuse presenting to the ED for constant chest pain for the last 2 weeks. Patient was seen and evaluated yesterday by his PCP for the same symptoms and started on a Z pak and given symptomatic medications. He states these medications have not helped, but only started them this morning. He states the medications are giving him a bad taste in his mouth and burning in his throat. He denies any nausea, vomiting, diarrhea, abdominal pain, shortness of breath, leg swelling. No history of MI. Last echocardiogram 2013: Left ventricular ejection fraction was normal at rest and with stress. There was no echocardiographic evidence for stress-induced ischemia.   Patient is a 74 y.o. male presenting with chest pain and abdominal pain.  Chest Pain Associated symptoms: no diaphoresis, no fever, no palpitations, no shortness of breath and not vomiting   Abdominal Pain Associated symptoms: chest pain   Associated symptoms: no chills, no fever, no shortness of breath and no vomiting     Past Medical History  Diagnosis Date  . Arthritis   . Hypertension   . Rectal cancer   . Weakness   . HA (headache)   . TIA (transient ischemic attack)   . Chest pain   . GERD (gastroesophageal reflux disease)    Past Surgical History  Procedure Laterality Date  . Surgical excision of rectal cancer     Family History  Problem Relation Age of Onset  . Hypertension Mother   . Cancer Mother    History  Substance Use Topics  . Smoking status: Current Some Day Smoker -- 0.50 packs/day for 47 years    Types: Cigarettes  . Smokeless tobacco: Never  Used  . Alcohol Use: 0.0 oz/week     Comment: heavy drinker, last alcohol was yesterday     Review of Systems  Constitutional: Negative for fever, chills and diaphoresis.  Respiratory: Negative for shortness of breath.   Cardiovascular: Positive for chest pain. Negative for palpitations and leg swelling.  Gastrointestinal: Negative for vomiting.  All other systems reviewed and are negative.     Allergies  Review of patient's allergies indicates no known allergies.  Home Medications   Prior to Admission medications   Medication Sig Start Date End Date Taking? Authorizing Provider  amLODipine (NORVASC) 10 MG tablet take 1 tablet by mouth once daily 01/20/14   Shawnee Knapp, MD  amoxicillin (AMOXIL) 875 MG tablet Take 1 tablet (875 mg total) by mouth 2 (two) times daily. 06/13/14   Posey Boyer, MD  aspirin EC 81 MG tablet Take 81 mg by mouth daily.     Historical Provider, MD  azithromycin (ZITHROMAX) 250 MG tablet Take 2 pills by mouth on day 1, then 1 pill by mouth per day on days 2 through 5. 05/28/14   Wendie Agreste, MD  clotrimazole (LOTRIMIN) 1 % cream Apply 1 application topically 2 (two) times daily. Between toes for next 2 weeks. 05/28/14   Wendie Agreste, MD  HYDROcodone-homatropine [facility]) 5-1.5 MG/5ML syrup Take 5 mLs by mouth every 4 (four) hours as needed. 06/13/14   Posey Boyer, MD  lisinopril (PRINIVIL,ZESTRIL)  20 MG tablet take 1 tablet by mouth once daily 01/20/14   Shawnee Knapp, MD  mupirocin ointment (BACTROBAN) 2 % Apply 1 application topically 3 (three) times daily. For 1 week. 05/28/14   Wendie Agreste, MD  omeprazole (PRILOSEC) 40 MG capsule Take 1 capsule (40 mg total) by mouth daily. 02/23/14   Darlyne Russian, MD  predniSONE (DELTASONE) 20 MG tablet Take 1 tablet (20 mg total) by mouth daily with breakfast. 06/13/14   Posey Boyer, MD   BP 150/88 mmHg  Pulse 69  Temp(Src) 97.4 F (36.3 C) (Oral)  Resp 14  SpO2 95% Physical Exam  Constitutional: He is  oriented to person, place, and time. He appears well-developed and well-nourished. No distress.  HENT:  Head: Normocephalic and atraumatic.  Right Ear: External ear normal.  Left Ear: External ear normal.  Nose: Nose normal.  Mouth/Throat: Oropharynx is clear and moist. No oropharyngeal exudate.  Eyes: Conjunctivae are normal.  Neck: Neck supple.  Cardiovascular: Normal rate, regular rhythm, normal heart sounds and intact distal pulses.   Pulmonary/Chest: Effort normal and breath sounds normal. No respiratory distress. He exhibits tenderness. He exhibits no deformity and no swelling.    Abdominal: Soft. Bowel sounds are normal. There is no tenderness.  Musculoskeletal: Normal range of motion. He exhibits no edema.  Neurological: He is alert and oriented to person, place, and time.  Skin: Skin is warm and dry. No rash noted. He is not diaphoretic.  Nursing note and vitals reviewed.   ED Course  Procedures (including critical care time) Medications  gi cocktail (Maalox,Lidocaine,Donnatal) (30 mLs Oral Given 06/14/14 1728)  albuterol (PROVENTIL HFA;VENTOLIN HFA) 108 (90 BASE) MCG/ACT inhaler 1-2 puff (2 puffs Inhalation Given 06/14/14 1905)    Labs Review Labs Reviewed  COMPREHENSIVE METABOLIC PANEL - Abnormal; Notable for the following:    Glucose, Bld 118 (*)    BUN <5 (*)    AST 55 (*)    Alkaline Phosphatase 132 (*)    GFR calc non Af Amer 88 (*)    All other components within normal limits  CBC WITH DIFFERENTIAL  LIPASE, BLOOD  TROPONIN I  Randolm Idol, ED    Imaging Review Dg Chest 2 View  06/14/2014   CLINICAL DATA:  Chest pain for 3 weeks.  EXAM: CHEST  2 VIEW  COMPARISON:  May 28, 2014.  FINDINGS: Stable cardiomediastinal silhouette. No pneumothorax or pleural effusion is noted. Both lungs are clear. The visualized skeletal structures are unremarkable.  IMPRESSION: No active cardiopulmonary disease.   Electronically Signed   By: Sabino Dick M.D.   On: 06/14/2014  17:48     EKG Interpretation   Date/Time:  Sunday June 14 2014 14:15:55 EST Ventricular Rate:  71 PR Interval:  192 QRS Duration: 98 QT Interval:  412 QTC Calculation: 447 R Axis:   -29 Text Interpretation:  Normal sinus rhythm ST \\T \ T wave abnormality,  consider anterior ischemia Abnormal ECG agree. no change from old.  Confirmed by Johnney Killian, MD, Jeannie Done [phone]) on 06/14/2014 6:35:24 PM      MDM   Final diagnoses:  Chest pain, atypical    Filed Vitals:   06/14/14 1900  BP: 150/88  Pulse: 69  Temp:   Resp: 14   Afebrile, NAD, non-toxic appearing, AAOx4. Patient is to be discharged with recommendation to follow up with PCP in regards to today's hospital visit. Chest pain is not likely of cardiac or pulmonary etiology d/t presentation, low suspicion for  PE, VSS, no tracheal deviation, no JVD or new murmur, RRR, breath sounds equal bilaterally, EKG without acute abnormalities, negative troponin, and negative CXR. Pt has been advised to continue on his medications prescribed by his PCP yesterday and return to the ED is CP becomes exertional, associated with diaphoresis or nausea, radiates to left jaw/arm, worsens or becomes concerning in any way. Pt appears reliable for follow up and is agreeable to discharge.   Case has been discussed with and seen by Dr. Johnney Killian who agrees with the above plan to discharge.        Harlow Mares, PA-C 06/15/14 0128  Charlesetta Shanks, MD 06/19/14 [phone]

## 2014-06-14 NOTE — ED Notes (Signed)
[name] mid sternal CP and right side pain x 2 weeks

## 2014-06-14 NOTE — ED Notes (Signed)
Spoke with x-ray sending transport for patient to complete chest x-ray.

## 2014-06-14 NOTE — ED Notes (Signed)
Pt made aware to return if symptoms worsen or if any life threatening symptoms occur.   

## 2014-06-15 LAB — I-STAT TROPONIN, ED: Troponin i, poc: 0 ng/mL (ref 0.00–0.08)

## 2014-06-17 DIAGNOSIS — K645 Perianal venous thrombosis: Secondary | ICD-10-CM | POA: Diagnosis not present

## 2014-07-07 ENCOUNTER — Emergency Department (HOSPITAL_COMMUNITY)
Admission: EM | Admit: 2014-07-07 | Discharge: 2014-07-07 | Disposition: A | Discharge status: Home / Self Care | Payer: Medicare Other | Attending: Emergency Medicine | Admitting: Emergency Medicine

## 2014-07-07 ENCOUNTER — Emergency Department (HOSPITAL_COMMUNITY): Payer: Medicare Other

## 2014-07-07 ENCOUNTER — Encounter (HOSPITAL_COMMUNITY): Payer: Self-pay | Admitting: *Deleted

## 2014-07-07 DIAGNOSIS — R0600 Dyspnea, unspecified: Secondary | ICD-10-CM | POA: Diagnosis not present

## 2014-07-07 DIAGNOSIS — Z79899 Other long term (current) drug therapy: Secondary | ICD-10-CM | POA: Insufficient documentation

## 2014-07-07 DIAGNOSIS — R0602 Shortness of breath: Secondary | ICD-10-CM | POA: Diagnosis not present

## 2014-07-07 DIAGNOSIS — R079 Chest pain, unspecified: Secondary | ICD-10-CM | POA: Diagnosis present

## 2014-07-07 DIAGNOSIS — R0601 Orthopnea: Secondary | ICD-10-CM | POA: Diagnosis not present

## 2014-07-07 DIAGNOSIS — I10 Essential (primary) hypertension: Secondary | ICD-10-CM | POA: Diagnosis not present

## 2014-07-07 DIAGNOSIS — R05 Cough: Secondary | ICD-10-CM | POA: Insufficient documentation

## 2014-07-07 DIAGNOSIS — Z7982 Long term (current) use of aspirin: Secondary | ICD-10-CM | POA: Diagnosis not present

## 2014-07-07 DIAGNOSIS — I517 Cardiomegaly: Secondary | ICD-10-CM | POA: Diagnosis not present

## 2014-07-07 DIAGNOSIS — M1990 Unspecified osteoarthritis, unspecified site: Secondary | ICD-10-CM | POA: Insufficient documentation

## 2014-07-07 DIAGNOSIS — Z8673 Personal history of transient ischemic attack (TIA), and cerebral infarction without residual deficits: Secondary | ICD-10-CM | POA: Diagnosis not present

## 2014-07-07 DIAGNOSIS — Z792 Long term (current) use of antibiotics: Secondary | ICD-10-CM | POA: Diagnosis not present

## 2014-07-07 DIAGNOSIS — Z85048 Personal history of other malignant neoplasm of rectum, rectosigmoid junction, and anus: Secondary | ICD-10-CM | POA: Diagnosis not present

## 2014-07-07 DIAGNOSIS — Z7952 Long term (current) use of systemic steroids: Secondary | ICD-10-CM | POA: Insufficient documentation

## 2014-07-07 DIAGNOSIS — Z720 Tobacco use: Secondary | ICD-10-CM | POA: Diagnosis not present

## 2014-07-07 DIAGNOSIS — K219 Gastro-esophageal reflux disease without esophagitis: Secondary | ICD-10-CM | POA: Insufficient documentation

## 2014-07-07 DIAGNOSIS — F17200 Nicotine dependence, unspecified, uncomplicated: Secondary | ICD-10-CM | POA: Diagnosis not present

## 2014-07-07 LAB — COMPREHENSIVE METABOLIC PANEL
ALK PHOS: 106 U/L (ref 39–117)
ALT: 38 U/L (ref 0–53)
AST: 46 U/L — ABNORMAL HIGH (ref 0–37)
Albumin: 3.9 g/dL (ref 3.5–5.2)
Anion gap: 9 (ref 5–15)
BILIRUBIN TOTAL: 1 mg/dL (ref 0.3–1.2)
CO2: 23 mmol/L (ref 19–32)
Calcium: 9.4 mg/dL (ref 8.4–10.5)
Chloride: 104 mmol/L (ref 96–112)
Creatinine, Ser: 0.83 mg/dL (ref 0.50–1.35)
GFR calc Af Amer: >90 mL/min (ref >90)
GFR calc non Af Amer: 86 mL/min — ABNORMAL LOW (ref >90)
GLUCOSE: 119 mg/dL — AB (ref 70–99)
Potassium: 3.9 mmol/L (ref 3.5–5.1)
Sodium: 136 mmol/L (ref 135–145)
Total Protein: 7.3 g/dL (ref 6.0–8.3)

## 2014-07-07 LAB — CBC WITH DIFFERENTIAL/PLATELET
BASOS ABS: 0 10*3/uL (ref 0.0–0.1)
BASOS PCT: 0 % (ref 0–1)
EOS PCT: 2 % (ref 0–5)
Eosinophils Absolute: 0.1 10*3/uL (ref 0.0–0.7)
HCT: 45.9 % (ref 39.0–52.0)
HEMOGLOBIN: 15.4 g/dL (ref 13.0–17.0)
LYMPHS ABS: 2.9 10*3/uL (ref 0.7–4.0)
LYMPHS PCT: 44 % (ref 12–46)
MCH: 30.4 pg (ref 26.0–34.0)
MCHC: 33.6 g/dL (ref 30.0–36.0)
MCV: 90.7 fL (ref 78.0–100.0)
Monocytes Absolute: 0.6 10*3/uL (ref 0.1–1.0)
Monocytes Relative: 8 % (ref 3–12)
NEUTROS PCT: 46 % (ref 43–77)
Neutro Abs: 3.1 10*3/uL (ref 1.7–7.7)
PLATELETS: 201 10*3/uL (ref 150–400)
RBC: 5.06 MIL/uL (ref 4.22–5.81)
RDW: 14 % (ref 11.5–15.5)
WBC: 6.7 10*3/uL (ref 4.0–10.5)

## 2014-07-07 LAB — URINALYSIS, ROUTINE W REFLEX MICROSCOPIC
Bilirubin Urine: NEGATIVE
GLUCOSE, UA: NEGATIVE mg/dL
HGB URINE DIPSTICK: NEGATIVE
Ketones, ur: NEGATIVE mg/dL
LEUKOCYTES UA: NEGATIVE
NITRITE: NEGATIVE
Protein, ur: NEGATIVE mg/dL
SPECIFIC GRAVITY, URINE: 1.012 (ref 1.005–1.030)
UROBILINOGEN UA: 0.2 mg/dL (ref 0.0–1.0)
pH: 7 (ref 5.0–8.0)

## 2014-07-07 LAB — TROPONIN I: Troponin I: <0.03 ng/mL (ref <0.031)

## 2014-07-07 LAB — BRAIN NATRIURETIC PEPTIDE: B Natriuretic Peptide: 16.6 pg/mL (ref 0.0–100.0)

## 2014-07-07 NOTE — Discharge Instructions (Signed)
Read the information below.  You may return to the Emergency Department at any time for worsening condition or any new symptoms that concern you.  If you develop worsening chest pain, shortness of breath, fever, you pass out, or become weak or dizzy, return to the ER for a recheck.      Shortness of Breath Shortness of breath means you have trouble breathing. It could also mean that you have a medical problem. You should get immediate medical care for shortness of breath. CAUSES   Not enough oxygen in the air such as with high altitudes or a smoke-filled room.  Certain lung diseases, infections, or problems.  Heart disease or conditions, such as angina or heart failure.  Low red blood cells (anemia).  Poor physical fitness, which can cause shortness of breath when you exercise.  Chest or back injuries or stiffness.  Being overweight.  Smoking.  Anxiety, which can make you feel like you are not getting enough air. DIAGNOSIS  Serious medical problems can often be found during your physical exam. Tests may also be done to determine why you are having shortness of breath. Tests may include:  Chest X-rays.  Lung function tests.  Blood tests.  An electrocardiogram (ECG).  An ambulatory electrocardiogram. An ambulatory ECG records your heartbeat patterns over a 24-hour period.  Exercise testing.  A transthoracic echocardiogram (TTE). During echocardiography, sound waves are used to evaluate how blood flows through your heart.  A transesophageal echocardiogram (TEE).  Imaging scans. Your health care provider may not be able to find a cause for your shortness of breath after your exam. In this case, it is important to have a follow-up exam with your health care provider as directed.  TREATMENT  Treatment for shortness of breath depends on the cause of your symptoms and can vary greatly. HOME CARE INSTRUCTIONS   Do not smoke. Smoking is a common cause of shortness of breath. If  you smoke, ask for help to quit.  Avoid being around chemicals or things that may bother your breathing, such as paint fumes and dust.  Rest as needed. Slowly resume your usual activities.  If medicines were prescribed, take them as directed for the full length of time directed. This includes oxygen and any inhaled medicines.  Keep all follow-up appointments as directed by your health care provider. SEEK MEDICAL CARE IF:   Your condition does not improve in the time expected.  You have a hard time doing your normal activities even with rest.  You have any new symptoms. SEEK IMMEDIATE MEDICAL CARE IF:   Your shortness of breath gets worse.  You feel light-headed, faint, or develop a cough not controlled with medicines.  You start coughing up blood.  You have pain with breathing.  You have chest pain or pain in your arms, shoulders, or abdomen.  You have a fever.  You are unable to walk up stairs or exercise the way you normally do. MAKE SURE YOU:  Understand these instructions.  Will watch your condition.  Will get help right away if you are not doing well or get worse. Document Released: 02/21/2001 Document Revised: 06/03/2013 Document Reviewed: 08/14/2011 [facility] Patient Information 2015 [place], [place]. This information is not intended to replace advice given to you by your health care provider. Make sure you discuss any questions you have with your health care provider.

## 2014-07-07 NOTE — ED Provider Notes (Signed)
CSN: [id number]     Arrival date & time 07/07/14  0754 History   First MD Initiated Contact with Patient 07/07/14 0800     Chief Complaint  Patient presents with  . Chest Pain  . Cough     (Consider location/radiation/quality/duration/timing/severity/associated sxs/prior Treatment) The history is provided by the patient and medical records.     Pt presents with 4 weeks of cough productive of white sputum, orthopnea, SOB only at night, chest discomfort with cough, sensation of "something moving around in my chest" at night."  Is urinating more than usual.  Was seen by PCP early this month and given z-pak without improvement.  Notes orthopnea has been ongoing x 3 months.  Denies fevers, abdominal pain, N/V/D, leg swelling.  No known heart or lung problems.  He is a smoker.    Past Medical History  Diagnosis Date  . Arthritis   . Hypertension   . Rectal cancer   . Weakness   . HA (headache)   . TIA (transient ischemic attack)   . Chest pain   . GERD (gastroesophageal reflux disease)    Past Surgical History  Procedure Laterality Date  . Surgical excision of rectal cancer     Family History  Problem Relation Age of Onset  . Hypertension Mother   . Cancer Mother    History  Substance Use Topics  . Smoking status: Current Some Day Smoker -- 0.50 packs/day for 47 years    Types: Cigarettes  . Smokeless tobacco: Never Used  . Alcohol Use: 0.0 oz/week     Comment: heavy drinker, last alcohol was yesterday     Review of Systems  All other systems reviewed and are negative.     Allergies  Review of patient's allergies indicates no known allergies.  Home Medications   Prior to Admission medications   Medication Sig Start Date End Date Taking? Authorizing Provider  amLODipine (NORVASC) 10 MG tablet take 1 tablet by mouth once daily 01/20/14   Shawnee Knapp, MD  amoxicillin (AMOXIL) 875 MG tablet Take 1 tablet (875 mg total) by mouth 2 (two) times daily. 06/13/14   Posey Boyer, MD  aspirin EC 81 MG tablet Take 81 mg by mouth daily.     Historical Provider, MD  azithromycin (ZITHROMAX) 250 MG tablet Take 2 pills by mouth on day 1, then 1 pill by mouth per day on days 2 through 5. 05/28/14   Wendie Agreste, MD  clotrimazole (LOTRIMIN) 1 % cream Apply 1 application topically 2 (two) times daily. Between toes for next 2 weeks. 05/28/14   Wendie Agreste, MD  HYDROcodone-homatropine [facility]) 5-1.5 MG/5ML syrup Take 5 mLs by mouth every 4 (four) hours as needed. 06/13/14   Posey Boyer, MD  lisinopril (PRINIVIL,ZESTRIL) 20 MG tablet take 1 tablet by mouth once daily 01/20/14   Shawnee Knapp, MD  mupirocin ointment (BACTROBAN) 2 % Apply 1 application topically 3 (three) times daily. For 1 week. 05/28/14   Wendie Agreste, MD  omeprazole (PRILOSEC) 40 MG capsule Take 1 capsule (40 mg total) by mouth daily. 02/23/14   Darlyne Russian, MD  predniSONE (DELTASONE) 20 MG tablet Take 1 tablet (20 mg total) by mouth daily with breakfast. 06/13/14   Posey Boyer, MD   BP 143/75 mmHg  Pulse 80  Temp(Src) 97.8 F (36.6 C) (Oral)  Resp 18  SpO2 100% Physical Exam  Constitutional: He appears well-developed and well-nourished. No distress.  HENT:  Head: Normocephalic and atraumatic.  Neck: Neck supple.  Cardiovascular: Normal rate and regular rhythm.   Pulmonary/Chest: Effort normal and breath sounds normal. No respiratory distress. He has no wheezes. He has no rales.  Abdominal: Soft. He exhibits no distension. There is no tenderness. There is no rebound and no guarding.  Musculoskeletal: He exhibits no edema.  Neurological: He is alert. He exhibits normal muscle tone.  Skin: He is not diaphoretic.  Nursing note and vitals reviewed.   ED Course  Procedures (including critical care time) Labs Review Labs Reviewed  COMPREHENSIVE METABOLIC PANEL - Abnormal; Notable for the following:    Glucose, Bld 119 (*)    BUN <5 (*)    AST 46 (*)    GFR calc non Af Amer 86 (*)     All other components within normal limits  CBC WITH DIFFERENTIAL/PLATELET  TROPONIN I  BRAIN NATRIURETIC PEPTIDE  URINALYSIS, ROUTINE W REFLEX MICROSCOPIC    Imaging Review Dg Chest 2 View  07/07/2014   CLINICAL DATA:  Generalized chest pain beginning in abdomen and radiating to upper anterior chest region, sharp at times, occasional shortness of breath, hospitalized 3 weeks ago for same complaint without resolution, hypertension, smoker, history rectal cancer and GERD  EXAM: CHEST  2 VIEW  COMPARISON:  06/14/2014  FINDINGS: Enlargement of cardiac silhouette.  Elongation of the aorta.  Mediastinal contours and pulmonary vascularity normal.  Lungs clear.  No pleural effusion or pneumothorax.  Scattered endplate spur formation thoracic spine.  Glenohumeral degenerative changes.  IMPRESSION: Enlargement of cardiac silhouette.  No acute abnormalities.   Electronically Signed   By: Lavonia Dana M.D.   On: 07/07/2014 09:08     EKG Interpretation None        Date: 07/07/2014  Rate: 84  Rhythm: normal sinus rhythm  QRS Axis: normal  Intervals: normal  ST/T Wave abnormalities: nonspecific ST/T changes  Conduction Disutrbances:none  Narrative Interpretation:   Old EKG Reviewed: unchanged    9:28 AM Discussed pt with Dr Reather Converse.  MDM   Final diagnoses:  Orthopnea  Dyspnea    Afebrile nontoxic patient with 4 week hx of cough productive of white sputum, SOB, orthopnea, chest discomfort- symptoms occuring only at night.  CXR shows enlarged heart.  Labs very reassuring, troponin and BNP negative.  CBC, CMP unremarkable.  UA negative.  Suspect this may be very early heart failure vs sleep apnea, possibly even GERD.  Discussed all results and plan with patient and his son (with patient's permission).  Bedside cardiac [place] also performed by Dr Reather Converse.  Please see his note for further details.  Plan for d/c home with PCP and cardiology follow up.  Discussed result, findings, treatment, and follow up   with patient.  Pt given return precautions.  Pt verbalizes understanding and agrees with plan.        Clayton Bibles, PA-C 07/07/14 1333  Mariea Clonts, MD 07/07/14 [phone]

## 2014-07-07 NOTE — ED Notes (Signed)
Pt reports having chest congestion, productive cough with white sputum and difficulty sleeping. Was here two weeks ago for same, no distress noted.

## 2014-07-08 ENCOUNTER — Ambulatory Visit (INDEPENDENT_AMBULATORY_CARE_PROVIDER_SITE_OTHER): Payer: Medicare Other | Admitting: Cardiovascular Disease

## 2014-07-08 ENCOUNTER — Encounter: Payer: Self-pay | Admitting: Cardiovascular Disease

## 2014-07-08 VITALS — BP 120/68 | HR 96 | Ht 71.0 in | Wt 204.8 lb

## 2014-07-08 DIAGNOSIS — J449 Chronic obstructive pulmonary disease, unspecified: Secondary | ICD-10-CM

## 2014-07-08 DIAGNOSIS — R0600 Dyspnea, unspecified: Secondary | ICD-10-CM | POA: Diagnosis not present

## 2014-07-08 NOTE — Patient Instructions (Addendum)
Your physician wants you to follow-up in:   Andre [name] will receive a reminder letter in the mail two months in advance. If you don't receive a letter, please call our office to schedule the follow-up appointment. Your physician recommends that you continue on your current medications as directed. Please refer to the Current Medication list given to you today.   Andre [name]    You have been referred to PULMONARY  Your physician has recommended that you have a pulmonary function test. Pulmonary Function Tests are a group of tests that measure how well air moves in and out of your lungs.

## 2014-07-08 NOTE — Progress Notes (Signed)
Patient ID: Andre [name], male   DOB: 1940/08/26, 74 y.o.   MRN: [id number]     74 y.o. referred by Dr Linna Darner for dyspnea  He is a patient of Dr Burt Knack.  He had normal cath in 2013.  Seen for chest pain by Dr Aundra Dubin in 3/15 and r/o.  Hospital myovue was normal with no ischemia  EF 55%  In ER 1/24 with cough and dyspnea 4 weeks of cough productive of white sputum, orthopnea, SOB only at night, chest discomfort with cough, sensation of "something moving around in my chest" at night." Is urinating more than usual. Was seen by PCP early this month and given z-pak without improvement. Notes orthopnea has been ongoing x 3 months. Denies fevers, abdominal pain, N/V/D, leg swelling. No known heart or lung problems. He is a smoker.   CXR NAD CE  R/O normal BNP  Still with bronchitis and phlegm  Discussed his 53 pack year smoking history and need to quit.  Needs outpatient pulmonary referral and PFTls with DLCO and bronchodilators   ROS: Denies fever, malais, weight loss, blurry vision, decreased visual acuity, cough, sputum, SOB, hemoptysis, pleuritic pain, palpitaitons, heartburn, abdominal pain, melena, lower extremity edema, claudication, or rash.  All other systems reviewed and negative   General: Affect appropriate Healthy:  appears stated age 74: normal Neck supple with no adenopathy JVP normal no bruits no thyromegaly Lungs distant  no wheezing and good diaphragmatic motion Heart:  S1/S2 no murmur,rub, gallop or click PMI normal Abdomen: benighn, BS positve, no tenderness, no AAA no bruit.  No HSM or HJR Distal pulses intact with no bruits No edema Neuro non-focal Skin warm and dry No muscular weakness  Medications Current Outpatient Prescriptions  Medication Sig Dispense Refill  . amLODipine (NORVASC) 10 MG tablet take 1 tablet by mouth once daily 90 tablet 1  . amoxicillin (AMOXIL) 875 MG tablet Take 1 tablet (875 mg total) by mouth 2 (two) times daily. 14 tablet 0  . aspirin  EC 81 MG tablet Take 81 mg by mouth daily.     Marland Kitchen azithromycin (ZITHROMAX) 250 MG tablet Take 2 pills by mouth on day 1, then 1 pill by mouth per day on days 2 through 5. 6 tablet 0  . clotrimazole (LOTRIMIN) 1 % cream Apply 1 application topically 2 (two) times daily. Between toes for next 2 weeks. 30 g 0  . HYDROcodone-homatropine (HYCODAN) 5-1.5 MG/5ML syrup Take 5 mLs by mouth every 4 (four) hours as needed. 120 mL 0  . lisinopril (PRINIVIL,ZESTRIL) 20 MG tablet take 1 tablet by mouth once daily 90 tablet 1  . mupirocin ointment (BACTROBAN) 2 % Apply 1 application topically 3 (three) times daily. For 1 week. 22 g 0  . omeprazole (PRILOSEC) 40 MG capsule Take 1 capsule (40 mg total) by mouth daily. 90 capsule 1  . predniSONE (DELTASONE) 20 MG tablet Take 1 tablet (20 mg total) by mouth daily with breakfast. 12 tablet 0  . [DISCONTINUED] chlorthalidone (HYGROTON) 25 MG tablet Take 25 mg by mouth daily.     No current facility-administered medications for this visit.    Allergies Review of patient's allergies indicates no known allergies.  Family History: Family History  Problem Relation Age of Onset  . Hypertension Mother   . Cancer Mother     Social History: History   Social History  . Marital Status: Single    Spouse Name: N/A    Number of Children: 51  . Years of  Education: 12   Occupational History  .      retired   Social History Main Topics  . Smoking status: Current Some Day Smoker -- 0.50 packs/day for 47 years    Types: Cigarettes  . Smokeless tobacco: Never Used  . Alcohol Use: 0.0 oz/week     Comment: heavy drinker, last alcohol was yesterday   . Drug Use: No  . Sexual Activity: Not on file   Other Topics Concern  . Not on file   Social History Narrative   Patient is single, retired   Patient is right handed   Education level is high school   Caffeine consumption is 1 cup daily    Past Surgical History  Procedure Laterality Date  . Surgical  excision of rectal cancer      Past Medical History  Diagnosis Date  . Arthritis   . Hypertension   . Rectal cancer   . Weakness   . HA (headache)   . TIA (transient ischemic attack)   . Chest pain   . GERD (gastroesophageal reflux disease)     Electrocardiogram: 07/07/14 SR rate 86 normal   Assessment and Plan

## 2014-07-08 NOTE — Assessment & Plan Note (Signed)
Well controlled.  Continue current medications and low sodium Dash type diet.    

## 2014-07-08 NOTE — Assessment & Plan Note (Signed)
Not cardiac related with no CHF on CXR, normal BNP normal EF on echo and no CAD.  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  Suggested he take mucinex over counter.  Will get PFTls with DLCO and refer to pulmonary for further f/u

## 2014-07-15 ENCOUNTER — Emergency Department (HOSPITAL_COMMUNITY)
Admission: EM | Admit: 2014-07-15 | Discharge: 2014-07-15 | Disposition: A | Discharge status: Home / Self Care | Payer: Medicare Other | Attending: Emergency Medicine | Admitting: Emergency Medicine

## 2014-07-15 ENCOUNTER — Other Ambulatory Visit: Payer: Self-pay | Admitting: Family Medicine

## 2014-07-15 ENCOUNTER — Encounter (HOSPITAL_COMMUNITY): Payer: Self-pay | Admitting: Emergency Medicine

## 2014-07-15 ENCOUNTER — Emergency Department (HOSPITAL_COMMUNITY): Payer: Medicare Other

## 2014-07-15 DIAGNOSIS — Z7982 Long term (current) use of aspirin: Secondary | ICD-10-CM | POA: Insufficient documentation

## 2014-07-15 DIAGNOSIS — Z79899 Other long term (current) drug therapy: Secondary | ICD-10-CM | POA: Diagnosis not present

## 2014-07-15 DIAGNOSIS — R05 Cough: Secondary | ICD-10-CM | POA: Insufficient documentation

## 2014-07-15 DIAGNOSIS — R0600 Dyspnea, unspecified: Secondary | ICD-10-CM

## 2014-07-15 DIAGNOSIS — Z720 Tobacco use: Secondary | ICD-10-CM | POA: Insufficient documentation

## 2014-07-15 DIAGNOSIS — R0602 Shortness of breath: Secondary | ICD-10-CM | POA: Diagnosis not present

## 2014-07-15 DIAGNOSIS — R0789 Other chest pain: Secondary | ICD-10-CM | POA: Insufficient documentation

## 2014-07-15 DIAGNOSIS — K219 Gastro-esophageal reflux disease without esophagitis: Secondary | ICD-10-CM | POA: Insufficient documentation

## 2014-07-15 DIAGNOSIS — M1990 Unspecified osteoarthritis, unspecified site: Secondary | ICD-10-CM | POA: Diagnosis not present

## 2014-07-15 DIAGNOSIS — Z792 Long term (current) use of antibiotics: Secondary | ICD-10-CM | POA: Diagnosis not present

## 2014-07-15 DIAGNOSIS — I10 Essential (primary) hypertension: Secondary | ICD-10-CM | POA: Diagnosis not present

## 2014-07-15 DIAGNOSIS — Z85048 Personal history of other malignant neoplasm of rectum, rectosigmoid junction, and anus: Secondary | ICD-10-CM | POA: Diagnosis not present

## 2014-07-15 DIAGNOSIS — R079 Chest pain, unspecified: Secondary | ICD-10-CM | POA: Diagnosis present

## 2014-07-15 DIAGNOSIS — R0609 Other forms of dyspnea: Secondary | ICD-10-CM | POA: Insufficient documentation

## 2014-07-15 DIAGNOSIS — Z8673 Personal history of transient ischemic attack (TIA), and cerebral infarction without residual deficits: Secondary | ICD-10-CM | POA: Diagnosis not present

## 2014-07-15 LAB — BASIC METABOLIC PANEL
Anion gap: 7 (ref 5–15)
BUN: 5 mg/dL — ABNORMAL LOW (ref 6–23)
CO2: 24 mmol/L (ref 19–32)
CREATININE: 0.78 mg/dL (ref 0.50–1.35)
Calcium: 9.1 mg/dL (ref 8.4–10.5)
Chloride: 105 mmol/L (ref 96–112)
GFR calc Af Amer: >90 mL/min (ref >90)
GFR calc non Af Amer: 88 mL/min — ABNORMAL LOW (ref >90)
Glucose, Bld: 126 mg/dL — ABNORMAL HIGH (ref 70–99)
Potassium: 4.3 mmol/L (ref 3.5–5.1)
Sodium: 136 mmol/L (ref 135–145)

## 2014-07-15 LAB — I-STAT TROPONIN, ED: TROPONIN I, POC: 0 ng/mL (ref 0.00–0.08)

## 2014-07-15 LAB — CBC
HEMATOCRIT: 43.4 % (ref 39.0–52.0)
Hemoglobin: 14.7 g/dL (ref 13.0–17.0)
MCH: 30.6 pg (ref 26.0–34.0)
MCHC: 33.9 g/dL (ref 30.0–36.0)
MCV: 90.4 fL (ref 78.0–100.0)
PLATELETS: 241 10*3/uL (ref 150–400)
RBC: 4.8 MIL/uL (ref 4.22–5.81)
RDW: 13.7 % (ref 11.5–15.5)
WBC: 6.1 10*3/uL (ref 4.0–10.5)

## 2014-07-15 LAB — BRAIN NATRIURETIC PEPTIDE: B Natriuretic Peptide: 13.2 pg/mL (ref 0.0–100.0)

## 2014-07-15 MED ORDER — PANTOPRAZOLE SODIUM 20 MG PO TBEC: 40.0000 mg | DELAYED_RELEASE_TABLET | Freq: Every day | ORAL | Status: DC

## 2014-07-15 MED ORDER — PANTOPRAZOLE SODIUM 40 MG IV SOLR
40.0000 mg | Freq: Once | INTRAVENOUS | Status: AC
  Administered 2014-07-15: 40 mg via INTRAVENOUS
  Filled 2014-07-15: qty 40

## 2014-07-15 MED ORDER — SUCRALFATE 1 G PO TABS: 1.0000 g | ORAL_TABLET | Freq: Four times a day (QID) | ORAL | Status: DC

## 2014-07-15 MED ORDER — GI COCKTAIL ~~LOC~~
30.0000 mL | Freq: Once | ORAL | Status: AC
  Administered 2014-07-15: 30 mL via ORAL
  Filled 2014-07-15: qty 30

## 2014-07-15 NOTE — ED Provider Notes (Signed)
CSN: [id number]     Arrival date & time 07/15/14  0245 History  This chart was scribed for Kalman Drape, MD by Delphia Grates, ED Scribe. This patient was seen in room D31C/D31C and the patient's care was started at 3:06 AM.     Chief Complaint  Patient presents with  . Chest Pain    The history is provided by the patient. No language interpreter was used.     HPI Comments: Andre [name] is a 74 y.o. male, with history of TIA, HTN, GERD, and chest pain who presents to the Emergency Department complaining of intermittent, burning, mid to upper chest pain that has been ongoing for the past month. Patient was seen by cardiologist, Dr. Verl Blalock, and was informed his pain was not cardiac in nature. He reports the chest pain resolved for a week, but later returned. Patient states his chest pain feels similar to GERD, ans states he has been on medication for GERD for the past 2.5 weeks. There is associated SOB and productive cough of white sputum that occurs when lying down. He reports he is unable to sleep due to the increased chest pain and discomfort. He denies fever, chills, nausea, diaphoresis.   Past Medical History  Diagnosis Date  . Arthritis   . Hypertension   . Rectal cancer   . Weakness   . HA (headache)   . TIA (transient ischemic attack)   . Chest pain   . GERD (gastroesophageal reflux disease)    Past Surgical History  Procedure Laterality Date  . Surgical excision of rectal cancer     Family History  Problem Relation Age of Onset  . Hypertension Mother   . Cancer Mother    History  Substance Use Topics  . Smoking status: Current Some Day Smoker -- 0.50 packs/day for 47 years    Types: Cigarettes  . Smokeless tobacco: Never Used  . Alcohol Use: 0.0 oz/week     Comment: heavy drinker, last alcohol was yesterday     Review of Systems  Constitutional: Negative for fever, chills and diaphoresis.  Respiratory: Positive for cough and shortness of breath.   Cardiovascular:  Positive for chest pain.  Gastrointestinal: Negative for nausea.      Allergies  Review of patient's allergies indicates no known allergies.  Home Medications   Prior to Admission medications   Medication Sig Start Date End Date Taking? Authorizing Provider  amLODipine (NORVASC) 10 MG tablet take 1 tablet by mouth once daily 01/20/14   Shawnee Knapp, MD  amoxicillin (AMOXIL) 875 MG tablet Take 1 tablet (875 mg total) by mouth 2 (two) times daily. 06/13/14   Posey Boyer, MD  aspirin EC 81 MG tablet Take 81 mg by mouth daily.     Historical Provider, MD  clotrimazole (LOTRIMIN) 1 % cream Apply 1 application topically 2 (two) times daily. Between toes for next 2 weeks. 05/28/14   Wendie Agreste, MD  HYDROcodone-homatropine [facility]) 5-1.5 MG/5ML syrup Take 5 mLs by mouth every 4 (four) hours as needed. 06/13/14   Posey Boyer, MD  lisinopril (PRINIVIL,ZESTRIL) 20 MG tablet take 1 tablet by mouth once daily 01/20/14   Shawnee Knapp, MD  mupirocin ointment (BACTROBAN) 2 % Apply 1 application topically 3 (three) times daily. For 1 week. 05/28/14   Wendie Agreste, MD  omeprazole (PRILOSEC) 40 MG capsule Take 1 capsule (40 mg total) by mouth daily. 02/23/14   Darlyne Russian, MD   Triage Vitals:  BP 138/76 mmHg  Pulse 84  Temp(Src) 98.5 F (36.9 C) (Oral)  Resp 16  Ht 5\' 11"  (1.803 m)  Wt 205 lb (92.987 kg)  BMI 28.60 kg/m2  SpO2 99%  Physical Exam  Constitutional: He is oriented to person, place, and time. He appears well-developed and well-nourished. No distress.  HENT:  Head: Normocephalic and atraumatic.  Eyes: Conjunctivae and EOM are normal.  Neck: Neck supple. No tracheal deviation present.  Cardiovascular: Normal rate, regular rhythm and normal heart sounds.  Exam reveals no gallop and no friction rub.   No murmur heard. Pulmonary/Chest: Effort normal and breath sounds normal. No respiratory distress.  Abdominal: Soft. Bowel sounds are normal.  Musculoskeletal: Normal range of  motion.  Neurological: He is alert and oriented to person, place, and time.  Skin: Skin is warm and dry.  Psychiatric: He has a normal mood and affect. His behavior is normal.  Nursing note and vitals reviewed.   ED Course  Procedures (including critical care time)  DIAGNOSTIC STUDIES: Oxygen Saturation is 99% on room air, normal by my interpretation.    COORDINATION OF CARE: At [phone] Discussed treatment plan with patient which includes CXR and GI cocktail. Patient agrees.   Labs Review Labs Reviewed  BASIC METABOLIC PANEL - Abnormal; Notable for the following:    Glucose, Bld 126 (*)    BUN 5 (*)    GFR calc non Af Amer 88 (*)    All other components within normal limits  CBC  BRAIN NATRIURETIC PEPTIDE  I-STAT TROPOININ, ED    Imaging Review Dg Chest 2 View  07/15/2014   CLINICAL DATA:  Pain across the chest with shortness of breath.  EXAM: CHEST  2 VIEW  COMPARISON:  07/07/2014  FINDINGS: The stable cardiomegaly and tortuosity of the thoracic aorta. Borderline hyperinflation. No consolidation, pleural effusion, or pneumothorax. Pulmonary vasculature is normal. There is stable degenerative change throughout the thoracic spine.  IMPRESSION: No acute pulmonary process.  Stable cardiomegaly.   Electronically Signed   By: Jeb Levering M.D.   On: 07/15/2014 03:24     EKG Interpretation   Date/Time:  Wednesday July 15 2014 02:49:14 EST Ventricular Rate:  81 PR Interval:  194 QRS Duration: 92 QT Interval:  386 QTC Calculation: 448 R Axis:   4 Text Interpretation:  Normal sinus rhythm with sinus arrhythmia  Nonspecific T wave abnormality Abnormal ECG No significant change since  last tracing Confirmed by Dominick Zertuche  MD, Constance Whittle ([id number]) on 07/15/2014 2:56:37 AM      MDM   Final diagnoses:  Atypical chest pain  Gastroesophageal reflux disease, esophagitis presence not specified  Dyspnea, paroxysmal nocturnal    74 year old male with chest discomfort.  This is his third ED  visit.  He is also been seen by cardiology in the clinic.  He describes a burning pressure at the top of his chest associated with cough and shortness of breath only occurs at night when laying down.  His prior workups have not shown any ischemia or infection.  Symptoms started after a course of antibiotics and steroids.  Suspect GERD with some mild esophagitis.  We'll start on Carafate and Protonix.  We'll also refer to gastroenterology for further workup.  EKG and labs today, reassuring.  Patient given suggestions to help curb symptoms at night with elevation of the head of the bed, small frequent meals, not within 2 hours of laying down and use of new prescriptions.  I personally performed the services described in  this documentation, which was scribed in my presence. The recorded information has been reviewed and is accurate.     Kalman Drape, MD 07/15/14 [phone]

## 2014-07-15 NOTE — Discharge Instructions (Signed)
Your workup today again has not shown a specific issue with your heart.  It is thought that acid, anterior esophagus is causing your symptoms at night.  Try to keep the head of your bed elevated to help keep the acid from rising in your esophagus.  Follow the other instructions below.  Take medications as prescribed.  You received 2 new prescriptions today.  Follow-up with your doctor and with gastroenterology.    Chest Pain (Nonspecific) It is often hard to give a specific diagnosis for the cause of chest pain. There is always a chance that your pain could be related to something serious, such as a heart attack or a blood clot in the lungs. You need to follow up with your health care provider for further evaluation. CAUSES   Heartburn.  Pneumonia or bronchitis.  Anxiety or stress.  Inflammation around your heart (pericarditis) or lung (pleuritis or pleurisy).  A blood clot in the lung.  A collapsed lung (pneumothorax). It can develop suddenly on its own (spontaneous pneumothorax) or from trauma to the chest.  Shingles infection (herpes zoster virus). The chest wall is composed of bones, muscles, and cartilage. Any of these can be the source of the pain.  The bones can be bruised by injury.  The muscles or cartilage can be strained by coughing or overwork.  The cartilage can be affected by inflammation and become sore (costochondritis). DIAGNOSIS  Lab tests or other studies may be needed to find the cause of your pain. Your health care provider may have you take a test called an ambulatory electrocardiogram (ECG). An ECG records your heartbeat patterns over a 24-hour period. You may also have other tests, such as:  Transthoracic echocardiogram (TTE). During echocardiography, sound waves are used to evaluate how blood flows through your heart.  Transesophageal echocardiogram (TEE).  Cardiac monitoring. This allows your health care provider to monitor your heart rate and rhythm in  real time.  Holter monitor. This is a portable device that records your heartbeat and can help diagnose heart arrhythmias. It allows your health care provider to track your heart activity for several days, if needed.  Stress tests by exercise or by giving medicine that makes the heart beat faster. TREATMENT   Treatment depends on what may be causing your chest pain. Treatment may include:  Acid blockers for heartburn.  Anti-inflammatory medicine.  Pain medicine for inflammatory conditions.  Antibiotics if an infection is present.  You may be advised to change lifestyle habits. This includes stopping smoking and avoiding alcohol, caffeine, and chocolate.  You may be advised to keep your head raised (elevated) when sleeping. This reduces the chance of acid going backward from your stomach into your esophagus. Most of the time, nonspecific chest pain will improve within 2-3 days with rest and mild pain medicine.  HOME CARE INSTRUCTIONS   If antibiotics were prescribed, take them as directed. Finish them even if you start to feel better.  For the next few days, avoid physical activities that bring on chest pain. Continue physical activities as directed.  Do not use any tobacco products, including cigarettes, chewing tobacco, or electronic cigarettes.  Avoid drinking alcohol.  Only take medicine as directed by your health care provider.  Follow your health care provider's suggestions for further testing if your chest pain does not go away.  Keep any follow-up appointments you made. If you do not go to an appointment, you could develop lasting (chronic) problems with pain. If there is any problem  keeping an appointment, call to reschedule. SEEK MEDICAL CARE IF:   Your chest pain does not go away, even after treatment.  You have a rash with blisters on your chest.  You have a fever. SEEK IMMEDIATE MEDICAL CARE IF:   You have increased chest pain or pain that spreads to your arm,  neck, jaw, back, or abdomen.  You have shortness of breath.  You have an increasing cough, or you cough up blood.  You have severe back or abdominal pain.  You feel nauseous or vomit.  You have severe weakness.  You faint.  You have chills. This is an emergency. Do not wait to see if the pain will go away. Get medical help at once. Call your local emergency services (911 in [place] Do not drive yourself to the hospital. MAKE SURE YOU:   Understand these instructions.  Will watch your condition.  Will get help right away if you are not doing well or get worse. Document Released: 03/08/2005 Document Revised: 06/03/2013 Document Reviewed: 01/02/2008 [facility] Patient Information 2015 [place], [place]. This information is not intended to replace advice given to you by your health care provider. Make sure you discuss any questions you have with your health care provider.  Food Choices for Gastroesophageal Reflux Disease When you have gastroesophageal reflux disease (GERD), the foods you eat and your eating habits are very important. Choosing the right foods can help ease your discomfort.  WHAT GUIDELINES DO I NEED TO FOLLOW?   Choose fruits, vegetables, whole grains, and low-fat dairy products.   Choose low-fat meat, fish, and poultry.  Limit fats such as oils, salad dressings, butter, nuts, and avocado.   Keep a food diary. This helps you identify foods that cause symptoms.   Avoid foods that cause symptoms. These may be different for everyone.   Eat small meals often instead of 3 large meals a day.   Eat your meals slowly, in a place where you are relaxed.   Limit fried foods.   Cook foods using methods other than frying.   Avoid drinking alcohol.   Avoid drinking large amounts of liquids with your meals.   Avoid bending over or lying down until 2-3 hours after eating.  WHAT FOODS ARE NOT RECOMMENDED?  These are some foods and drinks that may make your  symptoms worse: Vegetables Tomatoes. Tomato juice. Tomato and spaghetti sauce. Chili peppers. Onion and garlic. Horseradish. Fruits Oranges, grapefruit, and lemon (fruit and juice). Meats High-fat meats, fish, and poultry. This includes hot dogs, ribs, ham, sausage, salami, and bacon. Dairy Whole milk and chocolate milk. Sour cream. Cream. Butter. Ice cream. Cream cheese.  Drinks Coffee and tea. Bubbly (carbonated) drinks or energy drinks. Condiments Hot sauce. Barbecue sauce.  Sweets/Desserts Chocolate and cocoa. Donuts. Peppermint and spearmint. Fats and Oils High-fat foods. This includes [place] fries and potato chips. Other Vinegar. Strong spices. This includes black pepper, white pepper, red pepper, cayenne, curry powder, cloves, ginger, and chili powder. The items listed above may not be a complete list of foods and drinks to avoid. Contact your dietitian for more information. Document Released: 11/28/2011 Document Revised: 06/03/2013 Document Reviewed: 04/02/2013 [facility] Patient Information 2015 [place], [place]. This information is not intended to replace advice given to you by your health care provider. Make sure you discuss any questions you have with your health care provider.  Gastroesophageal Reflux Disease, Adult Gastroesophageal reflux disease (GERD) happens when acid from your stomach flows up into the esophagus. When acid comes in  contact with the esophagus, the acid causes soreness (inflammation) in the esophagus. Over time, GERD may create small holes (ulcers) in the lining of the esophagus. CAUSES   Increased body weight. This puts pressure on the stomach, making acid rise from the stomach into the esophagus.  Smoking. This increases acid production in the stomach.  Drinking alcohol. This causes decreased pressure in the lower esophageal sphincter (valve or ring of muscle between the esophagus and stomach), allowing acid from the stomach into the esophagus.  Late  evening meals and a full stomach. This increases pressure and acid production in the stomach.  A malformed lower esophageal sphincter. Sometimes, no cause is found. SYMPTOMS   Burning pain in the lower part of the mid-chest behind the breastbone and in the mid-stomach area. This may occur twice a week or more often.  Trouble swallowing.  Sore throat.  Dry cough.  Asthma-like symptoms including chest tightness, shortness of breath, or wheezing. DIAGNOSIS  Your caregiver may be able to diagnose GERD based on your symptoms. In some cases, X-rays and other tests may be done to check for complications or to check the condition of your stomach and esophagus. TREATMENT  Your caregiver may recommend over-the-counter or prescription medicines to help decrease acid production. Ask your caregiver before starting or adding any new medicines.  HOME CARE INSTRUCTIONS   Change the factors that you can control. Ask your caregiver for guidance concerning weight loss, quitting smoking, and alcohol consumption.  Avoid foods and drinks that make your symptoms worse, such as:  Caffeine or alcoholic drinks.  Chocolate.  Peppermint or mint flavorings.  Garlic and onions.  Spicy foods.  Citrus fruits, such as oranges, lemons, or limes.  Tomato-based foods such as sauce, chili, salsa, and pizza.  Fried and fatty foods.  Avoid lying down for the 3 hours prior to your bedtime or prior to taking a nap.  Eat small, frequent meals instead of large meals.  Wear loose-fitting clothing. Do not wear anything tight around your waist that causes pressure on your stomach.  Raise the head of your bed 6 to 8 inches with wood blocks to help you sleep. Extra pillows will not help.  Only take over-the-counter or prescription medicines for pain, discomfort, or fever as directed by your caregiver.  Do not take aspirin, ibuprofen, or other nonsteroidal anti-inflammatory drugs (NSAIDs). SEEK IMMEDIATE MEDICAL  CARE IF:   You have pain in your arms, neck, jaw, teeth, or back.  Your pain increases or changes in intensity or duration.  You develop nausea, vomiting, or sweating (diaphoresis).  You develop shortness of breath, or you faint.  Your vomit is green, yellow, black, or looks like coffee grounds or blood.  Your stool is red, bloody, or black. These symptoms could be signs of other problems, such as heart disease, gastric bleeding, or esophageal bleeding. MAKE SURE YOU:   Understand these instructions.  Will watch your condition.  Will get help right away if you are not doing well or get worse. Document Released: 03/08/2005 Document Revised: 08/21/2011 Document Reviewed: 12/16/2010 [facility] Patient Information 2015 [place], [place]. This information is not intended to replace advice given to you by your health care provider. Make sure you discuss any questions you have with your health care provider.   Heartburn Heartburn is a painful, burning sensation in the chest. It may feel worse in certain positions, such as lying down or bending over. It is caused by stomach acid backing up into the tube that  carries food from the mouth down to the stomach (lower esophagus).  CAUSES   Large meals.  Certain foods and drinks.  Exercise.  Increased acid production.  Being overweight or obese.  Certain medicines. SYMPTOMS   Burning pain in the chest or lower throat.  Bitter taste in the mouth.  Coughing. DIAGNOSIS  If the usual treatments for heartburn do not improve your symptoms, then tests may be done to see if there is another condition present. Possible tests may include:  X-rays.  Endoscopy. This is when a tube with a light and a camera on the end is used to examine the esophagus and the stomach.  A test to measure the amount of acid in the esophagus (pH test).  A test to see if the esophagus is working properly (esophageal manometry).  Blood, breath, or stool tests to  check for bacteria that cause ulcers. TREATMENT   Your caregiver may tell you to use certain over-the-counter medicines (antacids, acid reducers) for mild heartburn.  Your caregiver may prescribe medicines to decrease the acid in your stomach or protect your stomach lining.  Your caregiver may recommend certain diet changes.  For severe cases, your caregiver may recommend that the head of your bed be elevated on blocks. (Sleeping with more pillows is not an effective treatment as it only changes the position of your head and does not improve the main problem of stomach acid refluxing into the esophagus.) HOME CARE INSTRUCTIONS   Take all medicines as directed by your caregiver.  Raise the head of your bed by putting blocks under the legs if instructed to by your caregiver.  Do not exercise right after eating.  Avoid eating 2 or 3 hours before bed. Do not lie down right after eating.  Eat small meals throughout the day instead of 3 large meals.  Stop smoking if you smoke.  Maintain a healthy weight.  Identify foods and beverages that make your symptoms worse and avoid them. Foods you may want to avoid include:  Peppers.  Chocolate.  High-fat foods, including fried foods.  Spicy foods.  Garlic and onions.  Citrus fruits, including oranges, grapefruit, lemons, and limes.  Food containing tomatoes or tomato products.  Mint.  Carbonated drinks, caffeinated drinks, and alcohol.  Vinegar. SEEK IMMEDIATE MEDICAL CARE IF:  You have severe chest pain that goes down your arm or into your jaw or neck.  You feel sweaty, dizzy, or lightheaded.  You are short of breath.  You vomit blood.  You have difficulty or pain with swallowing.  You have bloody or black, tarry stools.  You have episodes of heartburn more than 3 times a week for more than 2 weeks. MAKE SURE YOU:  Understand these instructions.  Will watch your condition.  Will get help right away if you are  not doing well or get worse. Document Released: 10/15/2008 Document Revised: 08/21/2011 Document Reviewed: 11/13/2010 [facility] Patient Information 2015 [place], [place]. This information is not intended to replace advice given to you by your health care provider. Make sure you discuss any questions you have with your health care provider.

## 2014-07-15 NOTE — ED Notes (Signed)
Pt A&OX4, ambulatory at d/c with steady gait, NAD, declined wheelchair, no further questions/concerns.

## 2014-07-15 NOTE — ED Notes (Signed)
Pt. reports intermittent mid chest pain with SOB and occasional productive cough for 3 weeks , denies nausea or diaphoresis . Pain increases during the night when lying on bed.

## 2014-07-20 ENCOUNTER — Other Ambulatory Visit: Payer: Self-pay | Admitting: Family Medicine

## 2014-07-20 DIAGNOSIS — F17210 Nicotine dependence, cigarettes, uncomplicated: Secondary | ICD-10-CM | POA: Diagnosis not present

## 2014-07-20 DIAGNOSIS — K219 Gastro-esophageal reflux disease without esophagitis: Secondary | ICD-10-CM | POA: Diagnosis not present

## 2014-07-22 ENCOUNTER — Emergency Department (HOSPITAL_COMMUNITY)
Admission: EM | Admit: 2014-07-22 | Discharge: 2014-07-22 | Disposition: A | Discharge status: Home / Self Care | Payer: Medicare Other | Attending: Emergency Medicine | Admitting: Emergency Medicine

## 2014-07-22 ENCOUNTER — Emergency Department (HOSPITAL_COMMUNITY): Payer: Medicare Other

## 2014-07-22 ENCOUNTER — Encounter (HOSPITAL_COMMUNITY): Payer: Self-pay | Admitting: Neurology

## 2014-07-22 DIAGNOSIS — R079 Chest pain, unspecified: Secondary | ICD-10-CM

## 2014-07-22 DIAGNOSIS — R51 Headache: Secondary | ICD-10-CM | POA: Diagnosis not present

## 2014-07-22 DIAGNOSIS — I10 Essential (primary) hypertension: Secondary | ICD-10-CM | POA: Diagnosis not present

## 2014-07-22 DIAGNOSIS — Z792 Long term (current) use of antibiotics: Secondary | ICD-10-CM | POA: Insufficient documentation

## 2014-07-22 DIAGNOSIS — Z8673 Personal history of transient ischemic attack (TIA), and cerebral infarction without residual deficits: Secondary | ICD-10-CM | POA: Diagnosis not present

## 2014-07-22 DIAGNOSIS — R0789 Other chest pain: Secondary | ICD-10-CM | POA: Diagnosis not present

## 2014-07-22 DIAGNOSIS — K219 Gastro-esophageal reflux disease without esophagitis: Secondary | ICD-10-CM | POA: Insufficient documentation

## 2014-07-22 DIAGNOSIS — Z79899 Other long term (current) drug therapy: Secondary | ICD-10-CM | POA: Diagnosis not present

## 2014-07-22 DIAGNOSIS — Z85048 Personal history of other malignant neoplasm of rectum, rectosigmoid junction, and anus: Secondary | ICD-10-CM | POA: Insufficient documentation

## 2014-07-22 DIAGNOSIS — Z7982 Long term (current) use of aspirin: Secondary | ICD-10-CM | POA: Insufficient documentation

## 2014-07-22 DIAGNOSIS — Z87891 Personal history of nicotine dependence: Secondary | ICD-10-CM | POA: Insufficient documentation

## 2014-07-22 DIAGNOSIS — R05 Cough: Secondary | ICD-10-CM | POA: Diagnosis not present

## 2014-07-22 DIAGNOSIS — M1990 Unspecified osteoarthritis, unspecified site: Secondary | ICD-10-CM | POA: Diagnosis not present

## 2014-07-22 LAB — CBC
HCT: 47.9 % (ref 39.0–52.0)
Hemoglobin: 16.1 g/dL (ref 13.0–17.0)
MCH: 31.2 pg (ref 26.0–34.0)
MCHC: 33.6 g/dL (ref 30.0–36.0)
MCV: 92.8 fL (ref 78.0–100.0)
Platelets: 232 10*3/uL (ref 150–400)
RBC: 5.16 MIL/uL (ref 4.22–5.81)
RDW: 13.9 % (ref 11.5–15.5)
WBC: 5.9 10*3/uL (ref 4.0–10.5)

## 2014-07-22 LAB — BASIC METABOLIC PANEL
Anion gap: 10 (ref 5–15)
BUN: <5 mg/dL — ABNORMAL LOW (ref 6–23)
CALCIUM: 9.5 mg/dL (ref 8.4–10.5)
CO2: 26 mmol/L (ref 19–32)
CREATININE: 0.84 mg/dL (ref 0.50–1.35)
Chloride: 103 mmol/L (ref 96–112)
GFR, EST NON AFRICAN AMERICAN: 85 mL/min — AB (ref >90)
Glucose, Bld: 121 mg/dL — ABNORMAL HIGH (ref 70–99)
Potassium: 3.8 mmol/L (ref 3.5–5.1)
SODIUM: 139 mmol/L (ref 135–145)

## 2014-07-22 LAB — I-STAT TROPONIN, ED
TROPONIN I, POC: 0 ng/mL (ref 0.00–0.08)
Troponin i, poc: 0 ng/mL (ref 0.00–0.08)

## 2014-07-22 MED ORDER — MORPHINE SULFATE 4 MG/ML IJ SOLN
4.0000 mg | Freq: Once | INTRAMUSCULAR | Status: AC
Start: 2014-07-22 — End: 2014-07-22
  Administered 2014-07-22: 4 mg via INTRAVENOUS
  Filled 2014-07-22: qty 1

## 2014-07-22 MED ORDER — ACETAMINOPHEN 325 MG PO TABS
650.0000 mg | ORAL_TABLET | Freq: Once | ORAL | Status: AC
  Administered 2014-07-22: 650 mg via ORAL
  Filled 2014-07-22: qty 2

## 2014-07-22 MED ORDER — GI COCKTAIL ~~LOC~~
30.0000 mL | Freq: Once | ORAL | Status: AC
  Administered 2014-07-22: 30 mL via ORAL
  Filled 2014-07-22: qty 30

## 2014-07-22 MED ORDER — FAMOTIDINE 20 MG PO TABS
20.0000 mg | ORAL_TABLET | Freq: Once | ORAL | Status: AC
  Administered 2014-07-22: 20 mg via ORAL
  Filled 2014-07-22: qty 1

## 2014-07-22 NOTE — Discharge Instructions (Signed)
Cardiologist will follow-up with you closely in the next week. If you have worsening symptoms, symptoms were new exertional yourself or generalized sweating please return to the ER.  If you were given medicines take as directed.  If you are on coumadin or contraceptives realize their levels and effectiveness is altered by many different medicines.  If you have any reaction (rash, tongues swelling, other) to the medicines stop taking and see a physician.   Please follow up as directed and return to the ER or see a physician for new or worsening symptoms.  Thank you. Filed Vitals:   07/22/14 1033 07/22/14 1045 07/22/14 1115 07/22/14 1145  BP: 144/79 145/89 132/83 127/72  Pulse: 67 66 100 60  Temp: 97.7 F (36.5 C)     TempSrc: Oral     Resp: 18 13 14 12   Height:      Weight:      SpO2: 100% 98% 100% 97%

## 2014-07-22 NOTE — ED Provider Notes (Signed)
CSN: [id number]     Arrival date & time 07/22/14  0818 History   First MD Initiated Contact with Patient 07/22/14 [phone]     Chief Complaint  Patient presents with  . Chest Pain     (Consider location/radiation/quality/duration/timing/severity/associated sxs/prior Treatment) HPI Comments: 74 year old male high blood pressure, smoking, alcohol history and stroke presents with anterior chest pain both sharp burning at times, worse with lying flat at night and also improves with movement. Patient denies diaphoresis or exertional symptoms. Patient denies any known heart attack or heart failure. Patient denies recent surgery, hemoptysis, blood clot history, significant source of breath, unilateral leg symptoms. Patient has had this recurrently for 4 months now however worse in the past 2 evenings. Patient initially had improvement with acid medicines however no longer has had improvement. Patient stress test partially 3 years ago which was unremarkable.  Patient is a 74 y.o. male presenting with chest pain. The history is provided by the patient.  Chest Pain Associated symptoms: cough and headache (gradual onsetfrontal)   Associated symptoms: no abdominal pain, no back pain, no fever, no shortness of breath and not vomiting     Past Medical History  Diagnosis Date  . Arthritis   . Hypertension   . Rectal cancer   . Weakness   . HA (headache)   . TIA (transient ischemic attack)   . Chest pain   . GERD (gastroesophageal reflux disease)    Past Surgical History  Procedure Laterality Date  . Surgical excision of rectal cancer     Family History  Problem Relation Age of Onset  . Hypertension Mother   . Cancer Mother    History  Substance Use Topics  . Smoking status: Former Smoker -- 0.50 packs/day for 47 years    Types: Cigarettes  . Smokeless tobacco: Never Used  . Alcohol Use: 0.0 oz/week     Comment: heavy drinker, last alcohol was yesterday     Review of Systems   Constitutional: Negative for fever and chills.  HENT: Negative for congestion.   Eyes: Negative for visual disturbance.  Respiratory: Positive for cough. Negative for shortness of breath.   Cardiovascular: Positive for chest pain. Negative for leg swelling.  Gastrointestinal: Negative for vomiting and abdominal pain.  Genitourinary: Negative for dysuria and flank pain.  Musculoskeletal: Negative for back pain, neck pain and neck stiffness.  Skin: Negative for rash.  Neurological: Positive for headaches (gradual onsetfrontal). Negative for light-headedness.      Allergies  Review of patient's allergies indicates no known allergies.  Home Medications   Prior to Admission medications   Medication Sig Start Date End Date Taking? Authorizing Provider  amLODipine (NORVASC) 10 MG tablet take 1 tablet by mouth once daily 07/16/14  Yes Posey Boyer, MD  aspirin EC 81 MG tablet Take 81 mg by mouth daily.    Yes Historical Provider, MD  clotrimazole (LOTRIMIN) 1 % cream Apply 1 application topically 2 (two) times daily. Between toes for next 2 weeks. 05/28/14  Yes Wendie Agreste, MD  lisinopril (PRINIVIL,ZESTRIL) 20 MG tablet TAKE 1 TABLET BY MOUTH ONCE DAILY.  "OV NEEDED FOR ADDITIONAL REFILLS" 07/20/14  Yes Chelle S Jeffery, PA-C  mupirocin ointment (BACTROBAN) 2 % Apply 1 application topically 3 (three) times daily. For 1 week. 05/28/14  Yes Wendie Agreste, MD  omeprazole (PRILOSEC) 40 MG capsule Take 40 mg by mouth daily. 05/21/14  Yes Historical Provider, MD  pantoprazole (PROTONIX) 20 MG tablet Take 2 tablets (40  mg total) by mouth daily. 07/15/14  Yes Kalman Drape, MD  sucralfate (CARAFATE) 1 G tablet Take 1 tablet (1 g total) by mouth 4 (four) times daily. 07/15/14  Yes Kalman Drape, MD  amLODipine (NORVASC) 10 MG tablet take 1 tablet by mouth once daily Patient not taking: Reported on 07/22/2014 07/20/14   Chelle S Jeffery, PA-C   BP 139/79 mmHg  Pulse 59  Temp(Src) 97.7 F (36.5 C)  (Oral)  Resp 13  Ht 5\' 11"  (1.803 m)  Wt 205 lb (92.987 kg)  BMI 28.60 kg/m2  SpO2 98% Physical Exam  Constitutional: He is oriented to person, place, and time. He appears well-developed and well-nourished.  HENT:  Head: Normocephalic and atraumatic.  Eyes: Right eye exhibits no discharge. Left eye exhibits no discharge.  Neck: Normal range of motion. Neck supple. No tracheal deviation present.  Cardiovascular: Normal rate, regular rhythm and intact distal pulses.   Pulmonary/Chest: Effort normal and breath sounds normal.  Abdominal: Soft. He exhibits no distension. There is no tenderness. There is no guarding.  Musculoskeletal: He exhibits no edema.  Neurological: He is alert and oriented to person, place, and time. No cranial nerve deficit.  Skin: Skin is warm. No rash noted.  Psychiatric: He has a normal mood and affect.  Nursing note and vitals reviewed.   ED Course  Procedures (including critical care time) Labs Review Labs Reviewed  BASIC METABOLIC PANEL - Abnormal; Notable for the following:    Glucose, Bld 121 (*)    BUN <5 (*)    GFR calc non Af Amer 85 (*)    All other components within normal limits  CBC  I-STAT TROPOININ, ED  I-STAT TROPOININ, ED    Imaging Review Dg Chest 2 View  07/22/2014   CLINICAL DATA:  Substernal chest pain for 3 months, history of tobacco use for 50 years  EXAM: CHEST  2 VIEW  COMPARISON:  07/15/2014  FINDINGS: The heart size and mediastinal contours are within normal limits. Both lungs are clear. The visualized skeletal structures are unremarkable.  IMPRESSION: No active cardiopulmonary disease.   Electronically Signed   By: Inez Catalina M.D.   On: 07/22/2014 08:55     EKG Interpretation   Date/Time:  Wednesday July 22 2014 08:21:53 EST Ventricular Rate:  75 PR Interval:  180 QRS Duration: 94 QT Interval:  392 QTC Calculation: 437 R Axis:   -9 Text Interpretation:  Sinus rhythm with occasional Premature ventricular   complexes and Premature atrial complexes Nonspecific ST and T wave  abnormality Abnormal ECG Confirmed by Bucky Grigg  MD, Britanni Yarde ([id number]) on  07/22/2014 10:17:55 AM      MDM   Final diagnoses:  Chest pain, unspecified chest pain type   Patient with known vascular disease presents with atypical chest pain occurring for 3-4 months worsening the past 2 days. Reviewed last stress test overall unremarkable. With patient's age and known vascular history and discussed the case with cardiology for urgent outpatient stress test and appointment. Patient is comfortable with this plan and will return for worsening symptoms. EKG nonspecific ST T-wave changes overall similar to previous EKGs. Second troponin pending chest x-ray reviewed no acute findings. Patient improved in the ER. Cardiology will fall urgently outpatient, delta troponin negative Results and differential diagnosis were discussed with the patient/parent/guardian. Close follow up outpatient was discussed, comfortable with the plan.   Medications  famotidine (PEPCID) tablet 20 mg (20 mg Oral Given 07/22/14 1032)  gi cocktail (Maalox,Lidocaine,Donnatal) (30  mLs Oral Given 07/22/14 1032)  acetaminophen (TYLENOL) tablet 650 mg (650 mg Oral Given 07/22/14 1056)  morphine 4 MG/ML injection 4 mg (4 mg Intravenous Given 07/22/14 1056)    Filed Vitals:   07/22/14 1045 07/22/14 1115 07/22/14 1145 07/22/14 1245  BP: 145/89 132/83 127/72 139/79  Pulse: 66 100 60 59  Temp:      TempSrc:      Resp: 13 14 12 13   Height:      Weight:      SpO2: 98% 100% 97% 98%    Final diagnoses:  Chest pain, unspecified chest pain type       Mariea Clonts, MD 07/22/14 1302

## 2014-07-22 NOTE — ED Notes (Signed)
Patient transported to X-ray 

## 2014-07-22 NOTE — ED Notes (Signed)
Pt reports central cp, no radiation. Has been having for several months but got worse last night. Feels sob. Denies cardiac hx. No n/v. Is a x 4

## 2014-07-29 ENCOUNTER — Ambulatory Visit (INDEPENDENT_AMBULATORY_CARE_PROVIDER_SITE_OTHER): Payer: Medicare Other | Admitting: Internal Medicine

## 2014-07-29 ENCOUNTER — Ambulatory Visit: Payer: Medicare Other | Admitting: Cardiology

## 2014-07-29 DIAGNOSIS — J449 Chronic obstructive pulmonary disease, unspecified: Secondary | ICD-10-CM

## 2014-07-29 LAB — PULMONARY FUNCTION TEST
DL/VA % pred: 93 %
DL/VA: 4.33 ml/min/mmHg/L
DLCO UNC % PRED: 62 %
DLCO unc: 20.91 ml/min/mmHg
FEF 25-75 Post: 1.56 L/sec
FEF 25-75 Pre: 1.81 L/sec
FEF2575-%CHANGE-POST: -13 %
FEF2575-%Pred-Post: 63 %
FEF2575-%Pred-Pre: 73 %
FEV1-%Change-Post: -2 %
FEV1-%Pred-Post: 67 %
FEV1-%Pred-Pre: 69 %
FEV1-PRE: 2.04 L
FEV1-Post: 1.99 L
FEV1FVC-%Change-Post: 1 %
FEV1FVC-%Pred-Pre: 102 %
FEV6-%Change-Post: -2 %
FEV6-%Pred-Post: 67 %
FEV6-%Pred-Pre: 68 %
FEV6-PRE: 2.57 L
FEV6-Post: 2.51 L
FEV6FVC-%Change-Post: 1 %
FEV6FVC-%Pred-Post: 105 %
FEV6FVC-%Pred-Pre: 103 %
FVC-%CHANGE-POST: -3 %
FVC-%Pred-Post: 63 %
FVC-%Pred-Pre: 66 %
FVC-POST: 2.51 L
FVC-Pre: 2.62 L
POST FEV6/FVC RATIO: 100 %
Post FEV1/FVC ratio: 79 %
Pre FEV1/FVC ratio: 78 %
Pre FEV6/FVC Ratio: 98 %
RV % pred: 87 %
RV: 2.23 L
TLC % PRED: 66 %
TLC: 4.84 L

## 2014-07-29 NOTE — Progress Notes (Signed)
PFT done today. 

## 2014-08-04 ENCOUNTER — Other Ambulatory Visit (HOSPITAL_COMMUNITY): Payer: Self-pay | Admitting: Cardiovascular Disease

## 2014-08-05 ENCOUNTER — Other Ambulatory Visit: Payer: Self-pay | Admitting: *Deleted

## 2014-08-05 DIAGNOSIS — J849 Interstitial pulmonary disease, unspecified: Secondary | ICD-10-CM

## 2014-08-10 ENCOUNTER — Encounter (INDEPENDENT_AMBULATORY_CARE_PROVIDER_SITE_OTHER): Payer: Self-pay

## 2014-08-10 ENCOUNTER — Encounter: Payer: Self-pay | Admitting: Internal Medicine

## 2014-08-10 ENCOUNTER — Ambulatory Visit (INDEPENDENT_AMBULATORY_CARE_PROVIDER_SITE_OTHER): Payer: Medicare Other | Admitting: Internal Medicine

## 2014-08-10 VITALS — BP 122/68 | HR 101 | Ht 71.0 in | Wt 210.0 lb

## 2014-08-10 DIAGNOSIS — R0600 Dyspnea, unspecified: Secondary | ICD-10-CM

## 2014-08-10 DIAGNOSIS — I10 Essential (primary) hypertension: Secondary | ICD-10-CM

## 2014-08-10 DIAGNOSIS — R05 Cough: Secondary | ICD-10-CM

## 2014-08-10 DIAGNOSIS — R058 Other specified cough: Secondary | ICD-10-CM | POA: Insufficient documentation

## 2014-08-10 MED ORDER — VALSARTAN 160 MG PO TABS: 160.0000 mg | ORAL_TABLET | Freq: Every day | ORAL | Status: DC

## 2014-08-10 MED ORDER — FAMOTIDINE 20 MG PO TABS: ORAL_TABLET | ORAL | Status: DC

## 2014-08-10 NOTE — Assessment & Plan Note (Addendum)
PFTs  07/29/14  FEV1  1.99 (67%) ratio 79% and dlco 62 corrects to 93 - 08/10/2014  Walked RA x 3 laps @ 185 ft each stopped due to no desat, nl pace   Symptoms are markedly disproportionate to objective findings and not clear this is a lung problem but pt does appear to have difficult airway management issues.  DDX of  difficult airways management all start with A and  include Adherence, Ace Inhibitors, Acid Reflux, Active Sinus Disease, Alpha 1 Antitripsin deficiency, Anxiety masquerading as Airways dz,  ABPA,  allergy(esp in young), Aspiration (esp in elderly), Adverse effects of DPI,  Active smokers, plus two Bs  = Bronchiectasis and Beta blocker use..and one C= CHF  Adherence is always the initial "prime suspect" and is a multilayered concern that requires a "trust but verify" approach in every patient - starting with knowing how to use medications, especially inhalers, correctly, keeping up with refills and understanding the fundamental difference between maintenance and prns vs those medications only taken for a very short course and then stopped and not refilled.  - really doesn't know his meds well  ? Acid (or non-acid) GERD > always difficult to exclude as up to 75% of pts in some series report no assoc GI/ Heartburn symptoms> rec continue max (24h)  acid suppression and diet restrictions/ reviewed    ? ACEi > only way to know is trial off > see hbp

## 2014-08-10 NOTE — Patient Instructions (Addendum)
Stop lisinopril  Start avapro (valsartan) 160 mg daily   GERD (REFLUX)  is an extremely common cause of respiratory symptoms just like yours , many times with no obvious heartburn at all.    It can be treated with medication, but also with lifestyle changes including avoidance of late meals, excessive alcohol, smoking cessation, and avoid fatty foods, chocolate, peppermint, colas, red wine, and acidic juices such as orange juice.  NO MINT OR MENTHOL PRODUCTS SO NO COUGH DROPS  USE SUGARLESS CANDY INSTEAD (Jolley ranchers or Stover's or Life Savers) or even ice chips will also do - the key is to swallow to prevent all throat clearing. NO OIL BASED VITAMINS - use powdered substitutes.   Add pepcid ac 20 mg one at bedtime  Call me with the name of the pill you are taking and I will tell you what to do with it   Please schedule a follow up office visit in 6 weeks, call sooner if needed with all active medicines with you

## 2014-08-10 NOTE — Assessment & Plan Note (Signed)
D/c ACEi 08/10/2014  Unexplained sob / noct cough   ACE inhibitors are problematic in  pts with airway complaints because  even experienced pulmonologists can't always distinguish ace effects from copd/asthma.  By themselves they don't actually cause a problem, much like oxygen can't by itself start a fire, but they certainly serve as a powerful catalyst or enhancer for any "fire"  or inflammatory process in the upper airway, be it caused by an ET  tube or more commonly reflux (especially in the obese or pts with known GERD or who are on biphoshonates).    In the era of ARB near equivalency until we have a better handle on the reversibility of the airway problem, it just makes sense to avoid ACEI  entirely in the short run - it's the only way to tell cause and effect in a pt with very atypical symptoms

## 2014-08-10 NOTE — Progress Notes (Signed)
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 Advice Only    from Dr. Johnsie [name]; CP that goes from RT to LT side x5 months; no cough spits up white mucus mainly at night; SOB w/activity.  every night when lies down has immediate choking> coughing x months Walking at [facility] ok, can't rake leaves  X 5 min or do [employer] s giving out due to sob  Confused with all the meds he's taking for gerd "they don't work" Cp is paroxysmal, more common when lies down p a big meal, some better on gerd rx, migrates across ant chest R to L. No pleuritic component.   No obvious day to day or daytime variabilty or assoc  chest tightness, subjective wheeze overt sinus or hb symptoms. No unusual exp hx or h/o childhood pna/ asthma or knowledge of premature birth.  Sleeping ok without nocturnal  or early am exacerbation  of respiratory  c/o's or need for noct saba. Also denies any obvious fluctuation of symptoms with weather or environmental changes or other aggravating or alleviating factors except as outlined above   Current Medications, Allergies, Complete Past Medical History, Past Surgical History, Family History, and Social History were reviewed in [employer] record.  ROS  The following are not active complaints unless bolded sore throat, dysphagia, dental problems, itching, sneezing,  nasal congestion or excess/ purulent secretions, ear ache,   fever, chills, sweats, unintended wt loss, classically  pleuritic or exertional cp, hemoptysis,  orthopnea pnd or leg swelling, presyncope, palpitations, heartburn, abdominal pain, anorexia, nausea, vomiting, diarrhea  or  change in bowel or urinary habits, change in stools or urine, dysuria,hematuria,  rash, arthralgias, visual complaints, headache, numbness weakness or ataxia or problems with walking or coordination,  change in mood/affect or memory.       Review of Systems     Objective:   Physical Exam  amb hoarse bm nad  Wt Readings from Last 3 Encounters:  08/10/14 210 lb (95.255 kg)  07/22/14 205 lb (92.987 kg)  07/15/14 205 lb (92.987 kg)    Vital signs reviewed  HEENT: nl dentition, turbinates, and orophanx. Nl external ear canals without cough reflex   NECK :  without JVD/Nodes/TM/ nl carotid upstrokes bilaterally   LUNGS: no acc muscle use, clear to A and P bilaterally without cough on insp or exp maneuvers   CV:  RRR  no s3 or murmur or increase in P2, no edema   ABD:  soft and nontender with nl excursion in the supine position. No bruits or organomegaly, bowel sounds nl  MS:  warm without deformities, calf tenderness, cyanosis or clubbing  SKIN: warm and dry without lesions    NEURO:  alert, approp, no deficits      I personally reviewed images and agree with radiology impression as follows:  CXR:  07/22/14  No active cardiopulmonary disease.     Assessment:

## 2014-08-10 NOTE — Assessment & Plan Note (Addendum)
The most common causes of chronic cough in immunocompetent adults include the following: upper airway cough syndrome (UACS), previously referred to as postnasal drip syndrome (PNDS), which is caused by variety of rhinosinus conditions; (2) asthma; (3) GERD; (4) chronic bronchitis from cigarette smoking or other inhaled environmental irritants; (5) nonasthmatic eosinophilic bronchitis; and (6) bronchiectasis.  These conditions, singly or in combination, have accounted for up to 94% of the causes of chronic cough in prospective studies.  Other conditions have constituted no >6% of the causes in prospective studies These have included bronchogenic carcinoma, chronic interstitial pneumonia, sarcoidosis, left ventricular failure, ACEI-induced cough, and aspiration from a condition associated with pharyngeal dysfunction.    Chronic cough is often simultaneously caused by more than one condition. A single cause has been found from 38 to 82% of the time, multiple causes from 18 to 62%. Multiply caused cough has been the result of three diseases up to 42% of the time.      Based on hx and exam, this is most likely:  Classic Upper airway cough syndrome, so named because it's frequently impossible to sort out how much is  CR/sinusitis with freq throat clearing (which can be related to primary GERD)   vs  causing  secondary (" extra esophageal")  GERD from wide swings in gastric pressure that occur with throat clearing, often  promoting self use of mint and menthol lozenges that reduce the lower esophageal sphincter tone and exacerbate the problem further in a cyclical fashion.   These are the same pts (now being labeled as having "irritable larynx syndrome" by some cough centers) who not infrequently have a history of having failed to tolerate ace inhibitors( as is likely the case here) dry powder inhalers or biphosphonates or report having atypical reflux symptoms that don't respond to standard doses of PPI (as is  likely the case here) , and are easily confused as having aecopd or asthma flares by even experienced allergists/ pulmonologists.   The first step is to maximize acid suppression and eliminate acei then return/ regroup in 6 weeks  See instructions for specific recommendations which were reviewed directly with the patient who was given a copy with highlighter outlining the key components.

## 2014-08-11 ENCOUNTER — Telehealth: Payer: Self-pay | Admitting: Internal Medicine

## 2014-08-11 NOTE — Telephone Encounter (Signed)
Will route to MW.

## 2014-08-11 NOTE — Telephone Encounter (Signed)
We knew about that one, it's the one I asked him to stop so make sure he does

## 2014-08-11 NOTE — Telephone Encounter (Signed)
Spoke with pt. He is aware not to take anymore of this medication.

## 2014-08-26 ENCOUNTER — Telehealth: Payer: Self-pay | Admitting: Internal Medicine

## 2014-08-26 NOTE — Telephone Encounter (Signed)
Pt was given Propranolol 40 from urgent care. Wants to know if he should be taking this and Valsartan.  MW - please advise. Thanks.

## 2014-08-26 NOTE — Telephone Encounter (Signed)
Yes ok to take both unless having breathing problems in which case should come back for f/u ov asap with all active meds in hand

## 2014-08-26 NOTE — Telephone Encounter (Signed)
LMTCB x 1 

## 2014-08-27 NOTE — Telephone Encounter (Signed)
Called and spoke to pt. Pt stated he is having a slight increase in SOB, appt made with MW on 3/22 with all meds in hand. Pt verbalized understanding and denied any further questions or concerns at this time.    MW would you still like pt taking both meds? Thanks.

## 2014-08-27 NOTE — Telephone Encounter (Signed)
Should stop propranolol - keep appt with all meds

## 2014-08-27 NOTE — Telephone Encounter (Signed)
Pt aware of rec's per MW- pt only taking Valsartan 160mg  right now. Aware to bring all meds to upcoming appt with MW.  Nothing further needed.

## 2014-09-01 ENCOUNTER — Ambulatory Visit (INDEPENDENT_AMBULATORY_CARE_PROVIDER_SITE_OTHER): Payer: Medicare Other | Admitting: Internal Medicine

## 2014-09-01 ENCOUNTER — Encounter: Payer: Self-pay | Admitting: Internal Medicine

## 2014-09-01 VITALS — BP 148/80 | HR 64 | Ht 71.0 in | Wt 214.8 lb

## 2014-09-01 DIAGNOSIS — I10 Essential (primary) hypertension: Secondary | ICD-10-CM

## 2014-09-01 DIAGNOSIS — R51 Headache: Secondary | ICD-10-CM | POA: Diagnosis not present

## 2014-09-01 DIAGNOSIS — R05 Cough: Secondary | ICD-10-CM

## 2014-09-01 DIAGNOSIS — R058 Other specified cough: Secondary | ICD-10-CM

## 2014-09-01 DIAGNOSIS — R0600 Dyspnea, unspecified: Secondary | ICD-10-CM

## 2014-09-01 DIAGNOSIS — G8929 Other chronic pain: Secondary | ICD-10-CM

## 2014-09-01 MED ORDER — ISOMETHEPTENE-APAP-DICHLORAL 65-325-100 MG PO CAPS: 1.0000 | ORAL_CAPSULE | ORAL | Status: DC | PRN

## 2014-09-01 MED ORDER — PANTOPRAZOLE SODIUM 40 MG PO TBEC: 40.0000 mg | DELAYED_RELEASE_TABLET | Freq: Every day | ORAL | Status: DC

## 2014-09-01 NOTE — Patient Instructions (Addendum)
Pantoprazole (protonix) 40 mg   Take 30-60 min before first meal of the day and Pepcid 20 mg one bedtime until return to office - this is the best way to tell whether stomach acid is contributing to your problem.  GERD (REFLUX)  is an extremely common cause of respiratory symptoms just like yours , many times with no obvious heartburn at all.    It can be treated with medication, but also with lifestyle changes including avoidance of late meals, excessive alcohol, smoking cessation, and avoid fatty foods, chocolate, peppermint, colas, red wine, and acidic juices such as orange juice.  NO MINT OR MENTHOL PRODUCTS SO NO COUGH DROPS  USE SUGARLESS CANDY INSTEAD (Jolley ranchers or Stover's or Life Savers) or even ice chips will also do - the key is to swallow to prevent all throat clearing. NO OIL BASED VITAMINS - use powdered substitutes.   Cut down on coffee and try midrin one every 4 hours as needed for headache   Please remember to go to the lab  department downstairs for your tests - we will call you with the results when they are available.    Please schedule a follow up office visit in 4 weeks, sooner if needed    Add did not go to lab

## 2014-09-01 NOTE — Progress Notes (Signed)
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 Advice Only    from Dr. Johnsie Cancel; CP that goes from RT to LT side x5 months; no cough spits up white mucus mainly at night; SOB w/activity.  every night when lies down has immediate choking> coughing x months Walking at [facility] ok, can't rake leaves  X 5 min or do [employer] s giving out due to sob  Confused with all the meds he's taking for gerd "they don't work" Cp is paroxysmal, more common when lies down p a big meal, some better on gerd rx, migrates across ant chest R to L. No pleuritic component.  rec Stop lisinopril Start avapro (valsartan) 160 mg daily  GERD diet Add pepcid ac 20 mg one at bedtime Call me with the name of the pill you are taking and I will tell you what to do with it > confirmed lisinopril  Please schedule a follow up office visit in 6 weeks, call sooner if needed with all active medicines with you     09/01/2014 f/u ov/Andre [name] re: cough gone, now cc sob  Chief Complaint  Patient presents with  . Acute Visit    Increased SOB and HA x 2 weeks.   cough is completely gone and lies flat ok/ no noct symptoms  Sob only with exertion like raking leaves, last did this ok 6 m ago Andre [name] tends to be in afternoon, never am's or waking him p hs  Not using gerd rx as rec     No obvious day to day or daytime variabilty or assoc cp or  chest tightness, subjective wheeze overt sinus or hb symptoms. No unusual exp hx or h/o childhood pna/ asthma or knowledge of premature birth.  Sleeping ok without nocturnal  or early  am exacerbation  of respiratory  c/o's or need for noct saba. Also denies any obvious fluctuation of symptoms with weather or environmental changes or other aggravating or alleviating factors except as outlined above   Current Medications, Allergies, Complete Past Medical History, Past Surgical History, Family History, and Social History were reviewed in [employer] record.  ROS  The following are not active complaints unless bolded sore throat, dysphagia, dental problems, itching, sneezing,  nasal congestion or excess/ purulent secretions, ear ache,   fever, chills, sweats, unintended wt loss, classically  pleuritic or exertional cp, hemoptysis,  orthopnea pnd or leg swelling, presyncope, palpitations, heartburn, abdominal pain, anorexia, nausea, vomiting, diarrhea  or change in bowel or urinary habits, change in stools or urine, dysuria,hematuria,  rash, arthralgias, visual complaints, headache, numbness weakness or ataxia or problems with walking or coordination,  change in mood/affect or memory.             Objective:   Physical Exam  amb minimally hoarse bm nad   09/01/2014      Wt Readings from Last 3 Encounters:  08/10/14 210 lb (95.255 kg)  07/22/14 205 lb (92.987 kg)  07/15/14 205 lb (92.987 kg)  Vital signs reviewed  HEENT: nl dentition, turbinates, and orophanx. Nl external ear canals without cough reflex   NECK :  without JVD/Nodes/TM/ nl carotid upstrokes bilaterally   LUNGS: no acc muscle use, clear to A and P bilaterally without cough on insp or exp maneuvers   CV:  RRR  no s3 or murmur or increase in P2, no edema   ABD:  soft and nontender with nl excursion in the supine position. No bruits or organomegaly, bowel sounds nl  MS:  warm without deformities, calf tenderness, cyanosis or clubbing  SKIN: warm and dry without lesions    NEURO:  alert, approp, no deficits      I personally reviewed images and agree with radiology  impression as follows:  CXR:  07/22/14  No active cardiopulmonary disease.   Labs ordered: cbc, bmet, tsh, bnp Did not go to lab     Assessment:

## 2014-09-03 ENCOUNTER — Encounter: Payer: Self-pay | Admitting: Adult Health

## 2014-09-03 ENCOUNTER — Ambulatory Visit (INDEPENDENT_AMBULATORY_CARE_PROVIDER_SITE_OTHER): Payer: Medicare Other | Admitting: Adult Health

## 2014-09-03 VITALS — BP 153/88 | HR 71 | Ht 71.0 in | Wt 200.0 lb

## 2014-09-03 DIAGNOSIS — R51 Headache: Secondary | ICD-10-CM

## 2014-09-03 DIAGNOSIS — R519 Headache, unspecified: Secondary | ICD-10-CM

## 2014-09-03 MED ORDER — NORTRIPTYLINE HCL 25 MG PO CAPS: 25.0000 mg | ORAL_CAPSULE | Freq: Every day | ORAL | Status: DC

## 2014-09-03 NOTE — Patient Instructions (Signed)
Restart Nortriptyline 25 mg at bedtime. This may need to be increased in the future.  Midrin discontinued.  Consider getting a pill box in order to remember your medications.  If your headaches do not improve let us know.

## 2014-09-03 NOTE — Progress Notes (Signed)
PATIENT: Andre [name] DOB: [birth year] REASON FOR VISIT: follow up- Migraines HISTORY FROM: patient  HISTORY OF PRESENT ILLNESS: Andre [name] is a 74 year old male with a history of headaches. He returns today for follow-up. He states that he is no longer taking the indomethacin nor the nortriptyline. He states that he is unsure of the reason why he stopped them other than he must have forgotten to get them refilled. The patient did visit Dr.[name] and he ordered Midrin however the patient cannot afford this. The patient states that he has been having a daily headache. It is normally located in the right temporal region. Denies nausea and vomiting. Denies light or noise sensitivity. He states that his headache and last 2-3 hours. He states in the last 2 weeks she's been waking up with headaches. He states that he's been told that he snores. Patient denies excessive daytime sleepiness. He states that he may doze off if he is watching television. Patient continues to have a chronic cough with sputum production. This is been followed by his pulmonologist. He returns today for evaluation.  HISTORY: Andre [name] is a 74 year old male with a history of headaches. He returns today for follow-up. He is currently taking indomethacin and nortriptyline. He reports that his headaches have improved some. He now gets the headache about 3-4 times a week. He reports that his headaches are normally located in the temporal region. He states that his headaches can last 15 minutes to 1 hour and vary in severity. Denies nausea or vomiting. Denies photophobia and phonophobia.   HISTORY 04/02/14 Andre [name]): Andre [name] returns for follow-up for his headaches. He was initially evaluated in 11/30/11 for headaches with a persistent right temporal headache.He was drunk, fell in November of 2012 and had LOC and abrasion to his face on the right,. He has had intermittent headaches since that time lasting 15-20 minutes several times a week. He  has been hypertensive for 20 years, is on B/P meds does not know the name. Also has a hx of GERD and is on meds, does not know the name, He had a CT scan of the brain in 07/2011, which showed small Left pontine lacunar infarct. MRI of the brain 12/27/11 with chronic left pontine lacunar ischemic infarction, and chronic SVD. Mild atrophy also noted. MRA of the head normal. MRA of the neck demonstarated right vertebral artery origin is mildly narrowed, may be due to atherosclerosis. Bil internal carotid arteries have no stenosis. Depakote ER 250mg  qhs has been very effective for his headache prevention, last clinical visit was with Carolyn March 2014, he has run out of medication for about 6 months now, now he has recurrent headaches, mild bilateral frontal pressure headaches,He denies visual change, no significant gait difficulty UPDATE March 16th 2015: Since last visit, he continues to have frequent daily headaches, bilateral frontal, he is taking Depakote ER 500 mg without significant side effect, without much change of his headache either, he continued to drink occasionally, denies recent history of head trauma, denied gait difficulty, mild blurry vision when looking closeup object UPDATE Oct 22nd 2015: I have reviewed MRI of the brain in August 29 2013, periventricular small vessel disease, evidence of left and tentorium pontine lacunar infarction, no acute lesions,He is taking amlodipine, lisinopril, daily aspirin, but no longer taking Depakote, has run out of refills, He continued to complain almost constant right frontal area headaches, 8 out of 10, took Aleve, aspirin, without helping, He continued to drink occasionally, smokes daily, lives  alone, drive himself to clinic today  REVIEW OF SYSTEMS: Out of a complete 14 system review of symptoms, the patient complains only of the following symptoms, and all other reviewed systems are negative.  Double vision, loss of vision, chest pain,  headache  ALLERGIES: No Known Allergies  HOME MEDICATIONS: Outpatient Prescriptions Prior to Visit  Medication Sig Dispense Refill  . aspirin EC 81 MG tablet Take 81 mg by mouth daily.     . clotrimazole (LOTRIMIN) 1 % cream Apply 1 application topically 2 (two) times daily. Between toes for next 2 weeks. 30 g 0  . famotidine (PEPCID) 20 MG tablet One at bedtime    . isometheptene-acetaminophen-dichloralphenazone (MIDRIN) 65-325-100 MG capsule Take 1 capsule by mouth every 4 (four) hours as needed for headache. 30 capsule 0  . omeprazole (PRILOSEC) 40 MG capsule Take 40 mg by mouth daily.  0  . pantoprazole (PROTONIX) 40 MG tablet Take 1 tablet (40 mg total) by mouth daily. Take 30-60 min before first meal of the day 30 tablet 2  . valsartan (DIOVAN) 160 MG tablet Take 1 tablet (160 mg total) by mouth daily. 30 tablet 11   No facility-administered medications prior to visit.    PAST MEDICAL HISTORY: Past Medical History  Diagnosis Date  . Arthritis   . Hypertension   . Rectal cancer   . Weakness   . HA (headache)   . TIA (transient ischemic attack)   . Chest pain   . GERD (gastroesophageal reflux disease)     PAST SURGICAL HISTORY: Past Surgical History  Procedure Laterality Date  . Surgical excision of rectal cancer      FAMILY HISTORY: Family History  Problem Relation Age of Onset  . Hypertension Mother   . Cancer Mother     SOCIAL HISTORY: History   Social History  . Marital Status: Single    Spouse Name: N/A  . Number of Children: 11  . Years of Education: 12   Occupational History  .      retired   Social History Main Topics  . Smoking status: Light Tobacco Smoker -- 0.50 packs/day for 47 years    Types: Cigarettes  . Smokeless tobacco: Never Used  . Alcohol Use: 0.0 oz/week     Comment: heavy drinker, last alcohol was yesterday   . Drug Use: No  . Sexual Activity: Not on file   Other Topics Concern  . Not on file   Social History Narrative    Patient is single, retired   Patient is right handed   Education level is high school   Caffeine consumption is 1 cup daily      PHYSICAL EXAM  Filed Vitals:   09/03/14 1057  BP: 153/88  Pulse: 71  Height: 5\' 11"  (1.803 m)  Weight: 200 lb (90.719 kg)   Body mass index is 27.91 kg/(m^2).  Generalized: Well developed, in no acute distress   Neurological examination  Mentation: Alert oriented to time, place, history taking. Follows all commands speech and language fluent Cranial nerve II-XII: Pupils were equal round reactive to light. Extraocular movements were full, visual field were full on confrontational test. Facial sensation and strength were normal. Uvula tongue midline. Head turning and shoulder shrug  were normal and symmetric. Motor: The motor testing reveals 5 over 5 strength of all 4 extremities. Good symmetric motor tone is noted throughout.  Sensory: Sensory testing is intact to soft touch on all 4 extremities. No evidence of extinction is noted.  Coordination: Cerebellar testing reveals good finger-nose-finger and heel-to-shin bilaterally.  Gait and station: Gait is normal. Tandem gait is normal. Romberg is negative. No drift is seen.  Reflexes: Deep tendon reflexes are symmetric and normal bilaterally.    DIAGNOSTIC DATA (LABS, IMAGING, TESTING) - I reviewed patient records, labs, notes, testing and imaging myself where available.  Lab Results  Component Value Date   WBC 5.9 07/22/2014   HGB 16.1 07/22/2014   HCT 47.9 07/22/2014   MCV 92.8 07/22/2014   PLT 232 07/22/2014      Component Value Date/Time   NA 139 07/22/2014 0835   K 3.8 07/22/2014 0835   CL 103 07/22/2014 0835   CO2 26 07/22/2014 0835   GLUCOSE 121* 07/22/2014 0835   BUN <5* 07/22/2014 0835   CREATININE 0.84 07/22/2014 0835   CREATININE 0.70 08/28/2013 1210   CALCIUM 9.5 07/22/2014 0835   PROT 7.3 07/07/2014 0835   ALBUMIN 3.9 07/07/2014 0835   AST 46* 07/07/2014 0835   ALT 38  07/07/2014 0835   ALKPHOS 106 07/07/2014 0835   BILITOT 1.0 07/07/2014 0835   GFRNONAA 85* 07/22/2014 0835   GFRNONAA >89 08/28/2013 1210   GFRAA >90 07/22/2014 0835   GFRAA >89 08/28/2013 1210   Lab Results  Component Value Date   CHOL 143 08/22/2013   HDL 37* 08/22/2013   LDLCALC 83 08/22/2013   TRIG 114 08/22/2013   CHOLHDL 3.9 08/22/2013   Lab Results  Component Value Date   HGBA1C 6.0 02/23/2014   Lab Results  Component Value Date   VITAMINB12 258 02/23/2014   Lab Results  Component Value Date   TSH 1.140 04/02/2014      ASSESSMENT AND PLAN 74 y.o. year old male  has a past medical history of Arthritis; Hypertension; Rectal cancer; Weakness; HA (headache); TIA (transient ischemic attack); Chest pain; and GERD (gastroesophageal reflux disease). here with:  1. Headaches  The patient continues to have daily headaches. It seems that the patient has some compliance issues with his medications. I have instructed the patient that we will restart nortriptyline 25 mg at bedtime. Since the patient has been off this medication for some time we will restart on a low-dose. I have encouraged the patient to obtain a pillbox to help him organize his medications. I explained that taking his medication consistently is important for controlling his headaches. Patient verbalized understanding. In the future nortriptyline may have to be increased in order to receive maximal benefit. He was instructed to let us know if his headaches do not improve. He will follow-up in 3 months with Dr. Krista [name].  Ward Givens, MSN, NP-C 09/03/2014, 11:07 AM [facility] [address], [place], [place] [address] [phone]  Note: This document was prepared with digital dictation and possible smart phrase technology. Any transcriptional errors that result from this process are unintentional.

## 2014-09-04 NOTE — Progress Notes (Signed)
I have reviewed and agreed above plan. 

## 2014-09-06 NOTE — Assessment & Plan Note (Signed)
D/c ACEi 08/10/2014  Unexplained sob / noct cough   The cough is gone but still sob, bp adequately controlled on present rx

## 2014-09-06 NOTE — Assessment & Plan Note (Signed)
-   trial off acei 08/10/2014 > improved 09/01/14   This could be uacs exac by acei and not just a chronic acei case   Classic Upper airway cough syndrome, so named because it's frequently impossible to sort out how much is  CR/sinusitis with freq throat clearing (which can be related to primary GERD)   vs  causing  secondary (" extra esophageal")  GERD from wide swings in gastric pressure that occur with throat clearing, often  promoting self use of mint and menthol lozenges that reduce the lower esophageal sphincter tone and exacerbate the problem further in a cyclical fashion.   These are the same pts (now being labeled as having "irritable larynx syndrome" by some cough centers) who not infrequently have a history of having failed to tolerate ace inhibitors,  dry powder inhalers or biphosphonates or report having atypical reflux symptoms that don't respond to standard doses of PPI , and are easily confused as having aecopd or asthma flares by even experienced allergists/ pulmonologists.  For now rec stay off acei and rx max gerd rx

## 2014-09-06 NOTE — Assessment & Plan Note (Signed)
rec trial of midrin / cut down on caffeine

## 2014-09-06 NOTE — Assessment & Plan Note (Signed)
PFTs  07/29/14  FEV1  1.99 (67%) ratio 79% and dlco 62 corrects to 93 - 09/01/2014  Walked RA x 3 laps @ 185 ft each stopped due to end of study, nl pace, no desats    Symptoms are markedly disproportionate to objective findings and not clear this is a lung problem but pt does appear to have difficult airway management issues.  Believe this is c/w uacs/ see sep a/p

## 2014-09-10 ENCOUNTER — Encounter (HOSPITAL_COMMUNITY): Payer: Self-pay | Admitting: Emergency Medicine

## 2014-09-10 ENCOUNTER — Emergency Department (HOSPITAL_COMMUNITY)
Admission: EM | Admit: 2014-09-10 | Discharge: 2014-09-10 | Disposition: A | Discharge status: Home / Self Care | Payer: Medicare Other | Attending: Emergency Medicine | Admitting: Emergency Medicine

## 2014-09-10 DIAGNOSIS — Z7982 Long term (current) use of aspirin: Secondary | ICD-10-CM | POA: Insufficient documentation

## 2014-09-10 DIAGNOSIS — Z85048 Personal history of other malignant neoplasm of rectum, rectosigmoid junction, and anus: Secondary | ICD-10-CM | POA: Diagnosis not present

## 2014-09-10 DIAGNOSIS — H5712 Ocular pain, left eye: Secondary | ICD-10-CM | POA: Diagnosis present

## 2014-09-10 DIAGNOSIS — Z720 Tobacco use: Secondary | ICD-10-CM | POA: Insufficient documentation

## 2014-09-10 DIAGNOSIS — K219 Gastro-esophageal reflux disease without esophagitis: Secondary | ICD-10-CM | POA: Diagnosis not present

## 2014-09-10 DIAGNOSIS — Z79899 Other long term (current) drug therapy: Secondary | ICD-10-CM | POA: Diagnosis not present

## 2014-09-10 DIAGNOSIS — H109 Unspecified conjunctivitis: Secondary | ICD-10-CM

## 2014-09-10 DIAGNOSIS — Z8673 Personal history of transient ischemic attack (TIA), and cerebral infarction without residual deficits: Secondary | ICD-10-CM | POA: Insufficient documentation

## 2014-09-10 DIAGNOSIS — I10 Essential (primary) hypertension: Secondary | ICD-10-CM | POA: Insufficient documentation

## 2014-09-10 MED ORDER — FLUORESCEIN SODIUM 1 MG OP STRP
1.0000 | ORAL_STRIP | Freq: Once | OPHTHALMIC | Status: AC
  Administered 2014-09-10: 1 via OPHTHALMIC
  Filled 2014-09-10: qty 1

## 2014-09-10 MED ORDER — POLYMYXIN B-TRIMETHOPRIM 10000-0.1 UNIT/ML-% OP SOLN: 1.0000 [drp] | OPHTHALMIC | Status: DC

## 2014-09-10 MED ORDER — TETRACAINE HCL 0.5 % OP SOLN
1.0000 [drp] | Freq: Once | OPHTHALMIC | Status: AC
  Administered 2014-09-10: 1 [drp] via OPHTHALMIC
  Filled 2014-09-10: qty 2

## 2014-09-10 NOTE — ED Provider Notes (Signed)
CSN: [id number]     Arrival date & time 09/10/14  0941 History  This chart was scribed for non-physician practitioner, Alvina Chou, PA-C, working with Noemi Chapel, MD, by Chester Holstein, ED Scribe. This patient was seen in room TR04C/TR04C and the patient's care was started at 10:11 AM.    Chief Complaint  Patient presents with  . Eye Pain     Patient is a 74 y.o. male presenting with eye pain. The history is provided by the patient. No language interpreter was used.  Eye Pain   HPI Comments: Andre [name] is a 74 y.o. male who presents to the Emergency Department complaining of burning left eye pain with onset this morning. Pt states he worked in his garden yesterday and notes pain feels as though he has something under his eyelid. Pt notes associated blurred vision, redness, and eye watering. Pt denies recent sick contacts. Pt denies any other medical complaints at this time.   Past Medical History  Diagnosis Date  . Arthritis   . Hypertension   . Rectal cancer   . Weakness   . HA (headache)   . TIA (transient ischemic attack)   . Chest pain   . GERD (gastroesophageal reflux disease)    Past Surgical History  Procedure Laterality Date  . Surgical excision of rectal cancer     Family History  Problem Relation Age of Onset  . Hypertension Mother   . Cancer Mother    History  Substance Use Topics  . Smoking status: Light Tobacco Smoker -- 0.50 packs/day for 47 years    Types: Cigarettes  . Smokeless tobacco: Never Used  . Alcohol Use: 0.0 oz/week     Comment: heavy drinker, last alcohol was yesterday     Review of Systems  Eyes: Positive for pain, discharge (watering), redness and visual disturbance. Negative for itching.  All other systems reviewed and are negative.     Allergies  Review of patient's allergies indicates no known allergies.  Home Medications   Prior to Admission medications   Medication Sig Start Date End Date Taking? Authorizing Provider   aspirin EC 81 MG tablet Take 81 mg by mouth daily.     Historical Provider, MD  clotrimazole (LOTRIMIN) 1 % cream Apply 1 application topically 2 (two) times daily. Between toes for next 2 weeks. 05/28/14   Wendie Agreste, MD  famotidine (PEPCID) 20 MG tablet One at bedtime 08/10/14   Tanda Rockers, MD  nortriptyline (PAMELOR) 25 MG capsule Take 1 capsule (25 mg total) by mouth at bedtime. 09/03/14   Megan Spero Curb, NP  omeprazole (PRILOSEC) 40 MG capsule Take 40 mg by mouth daily. 05/21/14   Historical Provider, MD   BP 155/77 mmHg  Pulse 70  Temp(Src) 98.1 F (36.7 C) (Oral)  Resp 18  SpO2 100% Physical Exam  Constitutional: He is oriented to person, place, and time. He appears well-developed and well-nourished. No distress.  HENT:  Head: Normocephalic and atraumatic.  Eyes: Lids are everted and swept, no foreign bodies found. No foreign body present in the left eye. Left conjunctiva is injected.  Slit lamp exam:      The left eye shows no fluorescein uptake.  No corneal abrasion or foreign body noted on fluorescin dye exam.   Neck: Normal range of motion. Neck supple.  Cardiovascular: Normal rate and regular rhythm.  Exam reveals no gallop and no friction rub.   No murmur heard. Pulmonary/Chest: Effort normal and breath sounds normal.  He has no wheezes. He has no rales. He exhibits no tenderness.  Abdominal: Soft. He exhibits no distension. There is no tenderness. There is no rebound.  Musculoskeletal: Normal range of motion.  Neurological: He is alert and oriented to person, place, and time.  Speech is goal-oriented. Moves limbs without ataxia.   Skin: Skin is warm and dry.  Psychiatric: He has a normal mood and affect. His behavior is normal.  Nursing note and vitals reviewed.   ED Course  Procedures (including critical care time) DIAGNOSTIC STUDIES: Oxygen Saturation is 100% on room air, normal by my interpretation.    COORDINATION OF CARE: 10:18 AM Discussed  treatment plan with patient at beside, the patient agrees with the plan and has no further questions at this time.   Labs Review Labs Reviewed - No data to display  Imaging Review No results found.   EKG Interpretation None      MDM   Final diagnoses:  Conjunctivitis of left eye   Unchanged visual acuity. No corneal abrasion noted on fluorescin dye exam. Vitals stable and patient afebrile.   I personally performed the services described in this documentation, which was scribed in my presence. The recorded information has been reviewed and is accurate.     Alvina Chou, PA-C 09/10/14 1525  Noemi Chapel, MD 09/10/14 1556

## 2014-09-10 NOTE — ED Provider Notes (Signed)
74 year old male who woke up this morning feeling like he had something in his left eye. This is mild, persistent, on exam he is a totally normal-appearing globe, no foreign bodies, no abrasions, no periorbital swelling or redness. There is no rash over the face or the nose, no signs of zoster, no redness  Foreign body sensation, no indication for further workup or treatment, patient instructed not to rub his eye, follow-up as needed.  Medical screening examination/treatment/procedure(s) were conducted as a shared visit with non-physician practitioner(s) and myself.  I personally evaluated the patient during the encounter.  Clinical Impression:   Final diagnoses:  Conjunctivitis of left eye         Noemi Chapel, MD 09/10/14 1556

## 2014-09-10 NOTE — ED Notes (Signed)
Patient states he worked in garden yesterday.  Patient states woke up this morning and "it feels like something is under the top eyelid.  It hurts."   Patient denies other symptoms.

## 2014-09-10 NOTE — Discharge Instructions (Signed)
Use polytrim drops as directed until symptoms resolve. Refer to attached documents for more information.

## 2014-09-21 ENCOUNTER — Ambulatory Visit: Payer: Medicare Other | Admitting: Internal Medicine

## 2014-09-29 ENCOUNTER — Ambulatory Visit: Payer: PRIVATE HEALTH INSURANCE | Admitting: Internal Medicine

## 2014-11-04 ENCOUNTER — Emergency Department (HOSPITAL_COMMUNITY): Payer: Medicare Other

## 2014-11-04 ENCOUNTER — Encounter (HOSPITAL_COMMUNITY): Payer: Self-pay | Admitting: *Deleted

## 2014-11-04 ENCOUNTER — Emergency Department (HOSPITAL_COMMUNITY)
Admission: EM | Admit: 2014-11-04 | Discharge: 2014-11-04 | Disposition: A | Discharge status: Home / Self Care | Payer: Medicare Other | Attending: Emergency Medicine | Admitting: Emergency Medicine

## 2014-11-04 DIAGNOSIS — Z85048 Personal history of other malignant neoplasm of rectum, rectosigmoid junction, and anus: Secondary | ICD-10-CM | POA: Diagnosis not present

## 2014-11-04 DIAGNOSIS — Z720 Tobacco use: Secondary | ICD-10-CM | POA: Insufficient documentation

## 2014-11-04 DIAGNOSIS — Z7982 Long term (current) use of aspirin: Secondary | ICD-10-CM | POA: Diagnosis not present

## 2014-11-04 DIAGNOSIS — R51 Headache: Secondary | ICD-10-CM | POA: Insufficient documentation

## 2014-11-04 DIAGNOSIS — I10 Essential (primary) hypertension: Secondary | ICD-10-CM | POA: Insufficient documentation

## 2014-11-04 DIAGNOSIS — R0789 Other chest pain: Secondary | ICD-10-CM | POA: Diagnosis not present

## 2014-11-04 DIAGNOSIS — K219 Gastro-esophageal reflux disease without esophagitis: Secondary | ICD-10-CM | POA: Diagnosis not present

## 2014-11-04 DIAGNOSIS — Z79899 Other long term (current) drug therapy: Secondary | ICD-10-CM | POA: Insufficient documentation

## 2014-11-04 DIAGNOSIS — R059 Cough, unspecified: Secondary | ICD-10-CM

## 2014-11-04 DIAGNOSIS — R0602 Shortness of breath: Secondary | ICD-10-CM | POA: Diagnosis not present

## 2014-11-04 DIAGNOSIS — M1990 Unspecified osteoarthritis, unspecified site: Secondary | ICD-10-CM | POA: Diagnosis not present

## 2014-11-04 DIAGNOSIS — R519 Headache, unspecified: Secondary | ICD-10-CM

## 2014-11-04 DIAGNOSIS — R079 Chest pain, unspecified: Secondary | ICD-10-CM | POA: Diagnosis not present

## 2014-11-04 DIAGNOSIS — Z8673 Personal history of transient ischemic attack (TIA), and cerebral infarction without residual deficits: Secondary | ICD-10-CM | POA: Insufficient documentation

## 2014-11-04 DIAGNOSIS — R05 Cough: Secondary | ICD-10-CM

## 2014-11-04 LAB — CBC
HCT: 46.5 % (ref 39.0–52.0)
Hemoglobin: 15.1 g/dL (ref 13.0–17.0)
MCH: 30.3 pg (ref 26.0–34.0)
MCHC: 32.5 g/dL (ref 30.0–36.0)
MCV: 93.2 fL (ref 78.0–100.0)
Platelets: 207 10*3/uL (ref 150–400)
RBC: 4.99 MIL/uL (ref 4.22–5.81)
RDW: 15 % (ref 11.5–15.5)
WBC: 5.6 10*3/uL (ref 4.0–10.5)

## 2014-11-04 LAB — BASIC METABOLIC PANEL
Anion gap: 7 (ref 5–15)
BUN: <5 mg/dL — ABNORMAL LOW (ref 6–20)
CO2: 24 mmol/L (ref 22–32)
Calcium: 8.9 mg/dL (ref 8.9–10.3)
Chloride: 106 mmol/L (ref 101–111)
Creatinine, Ser: 0.68 mg/dL (ref 0.61–1.24)
GFR calc Af Amer: >60 mL/min (ref >60)
GFR calc non Af Amer: >60 mL/min (ref >60)
Glucose, Bld: 142 mg/dL — ABNORMAL HIGH (ref 65–99)
Potassium: 3.5 mmol/L (ref 3.5–5.1)
Sodium: 137 mmol/L (ref 135–145)

## 2014-11-04 LAB — I-STAT TROPONIN, ED: Troponin i, poc: 0 ng/mL (ref 0.00–0.08)

## 2014-11-04 MED ORDER — GUAIFENESIN 100 MG/5ML PO SYRP: 100.0000 mg | ORAL_SOLUTION | ORAL | Status: DC | PRN

## 2014-11-04 MED ORDER — ASPIRIN 81 MG PO CHEW
324.0000 mg | CHEWABLE_TABLET | Freq: Once | ORAL | Status: AC
  Administered 2014-11-04: 324 mg via ORAL
  Filled 2014-11-04: qty 4

## 2014-11-04 MED ORDER — MORPHINE SULFATE 4 MG/ML IJ SOLN
4.0000 mg | Freq: Once | INTRAMUSCULAR | Status: AC
  Administered 2014-11-04: 4 mg via INTRAVENOUS
  Filled 2014-11-04: qty 1

## 2014-11-04 MED ORDER — ONDANSETRON HCL 4 MG/2ML IJ SOLN
4.0000 mg | Freq: Once | INTRAMUSCULAR | Status: AC
  Administered 2014-11-04: 4 mg via INTRAVENOUS
  Filled 2014-11-04: qty 2

## 2014-11-04 NOTE — ED Notes (Signed)
To x-ray

## 2014-11-04 NOTE — ED Notes (Signed)
Patient presents stating he has had chest pain for 2 days - states it stated after coughing and coughing up yellow sputum (has been noticing yellow sputum for about 2 weeks) and stated his head has been hurting for about 1 month

## 2014-11-04 NOTE — Discharge Instructions (Signed)
Please read and follow all provided instructions.  Your diagnoses today include:  1. Chest pain, unspecified chest pain type   2. Cough   3. Chronic nonintractable headache, unspecified headache type     Tests performed today include:  An EKG of your heart  A chest x-ray  Cardiac enzymes - a blood test for heart muscle damage  Blood counts and electrolytes  Vital signs. See below for your results today.   Medications prescribed:   None  Take any prescribed medications only as directed.  Follow-up instructions: Please follow-up with your primary care provider as soon as you can for further evaluation of your symptoms.   Return instructions:  SEEK IMMEDIATE MEDICAL ATTENTION IF:  You have severe chest pain, especially if the pain is crushing or pressure-like and spreads to the arms, back, neck, or jaw, or if you have sweating, nausea (feeling sick to your stomach), or shortness of breath. THIS IS AN EMERGENCY. Don't wait to see if the pain will go away. Get medical help at once. Call 911 or 0 (operator). DO NOT drive yourself to the hospital.   Your chest pain gets worse and does not go away with rest.   You have an attack of chest pain lasting longer than usual, despite rest and treatment with the medications your caregiver has prescribed.   You wake from sleep with chest pain or shortness of breath.  You feel dizzy or faint.  You have chest pain not typical of your usual pain for which you originally saw your caregiver.   You have any other emergent concerns regarding your health.  Additional Information: Chest pain comes from many different causes. Your caregiver has diagnosed you as having chest pain that is not specific for one problem, but does not require admission.  You are at low risk for an acute heart condition or other serious illness.   Your vital signs today were: BP 181/90 mmHg   Pulse 72   Temp(Src) 97.8 F (36.6 C) (Oral)   Resp 16   Ht '5\' 10"'$  (1.778  m)   Wt 218 lb (98.884 kg)   BMI 31.28 kg/m2   SpO2 99% If your blood pressure (BP) was elevated above 135/85 this visit, please have this repeated by your doctor within one month. --------------

## 2014-11-04 NOTE — ED Notes (Signed)
NAD at this time. Pt is stable and going home.  

## 2014-11-04 NOTE — ED Provider Notes (Signed)
CSN: [id number]     Arrival date & time 11/04/14  5188 History   First MD Initiated Contact with Patient 11/04/14 0645     Chief Complaint  Patient presents with  . Chest Pain  . Headache     (Consider location/radiation/quality/duration/timing/severity/associated sxs/prior Treatment) HPI Comments: Patient with h/o chronic cough, atypical CP, previous CVA -- presents with complaint of chest pain and headache. Patient began having a left sided pain in his chest described as sharp and as a pressure. This has been associated with coughing productive of yellow sputum. No fevers. Patient states that he is short of breath. No hemoptysis. No palpitations or diaphoresis. Pain has come and go since 6 PM yesterday. Pain will last for 7 or 8 minutes when it comes on. Patient denies current chest pain. No treatments prior to arrival. The onset of this condition was acute. The course is constant. Aggravating factors: none. Alleviating factors: none.   Patient has been seen by cardiology and then by pulmonology (most recently 3/16) for atypical CP and cough. They d/c ACEi and also treated with PPI. Cough and DOE is an ongoing problem for him.   Patient also complains about headache. Headache is same as previous headaches. Patient is under the care of neurologist for this. He was recently restarted on nortriptyline as a headache prophylaxis. Headache is right-sided. Patient denies signs of stroke including: facial droop, slurred speech, aphasia, weakness/numbness in extremities, imbalance/trouble walking. No treatments for this prior to arrival this is been ongoing for one month.  Patient is a 74 y.o. male presenting with chest pain and headaches. The history is provided by the patient and medical records.  Chest Pain Associated symptoms: cough, headache and shortness of breath   Associated symptoms: no abdominal pain, no back pain, no diaphoresis, no fever, no nausea, no numbness, no palpitations, not vomiting  and no weakness   Headache Associated symptoms: cough   Associated symptoms: no abdominal pain, no back pain, no congestion, no fever, no nausea, no neck pain, no neck stiffness, no numbness, no photophobia, no sinus pressure, no vomiting and no weakness     Past Medical History  Diagnosis Date  . Arthritis   . Hypertension   . Rectal cancer   . Weakness   . HA (headache)   . TIA (transient ischemic attack)   . Chest pain   . GERD (gastroesophageal reflux disease)    Past Surgical History  Procedure Laterality Date  . Surgical excision of rectal cancer     Family History  Problem Relation Age of Onset  . Hypertension Mother   . Cancer Mother    History  Substance Use Topics  . Smoking status: Light Tobacco Smoker -- 0.50 packs/day for 47 years    Types: Cigarettes  . Smokeless tobacco: Never Used  . Alcohol Use: 0.0 oz/week     Comment: heavy drinker, last alcohol was yesterday     Review of Systems  Constitutional: Negative for fever and diaphoresis.  HENT: Negative for congestion, dental problem, rhinorrhea and sinus pressure.   Eyes: Negative for photophobia, discharge, redness and visual disturbance.  Respiratory: Positive for cough and shortness of breath.   Cardiovascular: Positive for chest pain. Negative for palpitations and leg swelling.  Gastrointestinal: Negative for nausea, vomiting and abdominal pain.  Genitourinary: Negative for dysuria.  Musculoskeletal: Negative for back pain, gait problem, neck pain and neck stiffness.  Skin: Negative for rash.  Neurological: Positive for headaches. Negative for syncope, speech difficulty, weakness,  light-headedness and numbness.  Psychiatric/Behavioral: Negative for confusion. The patient is not nervous/anxious.       Allergies  Review of patient's allergies indicates no known allergies.  Home Medications   Prior to Admission medications   Medication Sig Start Date End Date Taking? Authorizing Provider   aspirin EC 81 MG tablet Take 81 mg by mouth daily.     Historical Provider, MD  clotrimazole (LOTRIMIN) 1 % cream Apply 1 application topically 2 (two) times daily. Between toes for next 2 weeks. 05/28/14   Wendie Agreste, MD  famotidine (PEPCID) 20 MG tablet One at bedtime 08/10/14   Tanda Rockers, MD  nortriptyline (PAMELOR) 25 MG capsule Take 1 capsule (25 mg total) by mouth at bedtime. 09/03/14   Ward Givens, NP  omeprazole (PRILOSEC) 40 MG capsule Take 40 mg by mouth daily. 05/21/14   Historical Provider, MD  trimethoprim-polymyxin b (POLYTRIM) ophthalmic solution Place 1 drop into the left eye every 4 (four) hours. 09/10/14   Kaitlyn Szekalski, PA-C   BP 181/90 mmHg  Pulse 72  Temp(Src) 97.8 F (36.6 C) (Oral)  Resp 16  Ht '5\' 10"'$  (1.778 m)  Wt 218 lb (98.884 kg)  BMI 31.28 kg/m2  SpO2 99% Physical Exam  Constitutional: He is oriented to person, place, and time. He appears well-developed and well-nourished.  HENT:  Head: Normocephalic and atraumatic.  Right Ear: Tympanic membrane, external ear and ear canal normal.  Left Ear: Tympanic membrane, external ear and ear canal normal.  Nose: Nose normal.  Mouth/Throat: Uvula is midline, oropharynx is clear and moist and mucous membranes are normal. Mucous membranes are not dry.  Eyes: Conjunctivae, EOM and lids are normal. Pupils are equal, round, and reactive to light.  Neck: Trachea normal and normal range of motion. Neck supple. Normal carotid pulses and no JVD present. No muscular tenderness present. Carotid bruit is not present. No tracheal deviation present.  Cardiovascular: Normal rate, regular rhythm, S1 normal, S2 normal, normal heart sounds and intact distal pulses.  Exam reveals no distant heart sounds and no decreased pulses.   No murmur heard. Pulmonary/Chest: Effort normal and breath sounds normal. No respiratory distress. He has no wheezes. He exhibits no tenderness.  Abdominal: Soft. Normal aorta and bowel sounds are  normal. There is no tenderness. There is no rebound and no guarding.  Musculoskeletal: Normal range of motion. He exhibits no edema.       Cervical back: He exhibits normal range of motion, no tenderness and no bony tenderness.  Neurological: He is alert and oriented to person, place, and time. He has normal strength and normal reflexes. No cranial nerve deficit or sensory deficit. He exhibits normal muscle tone. He displays a negative Romberg sign. Coordination and gait normal. GCS eye subscore is 4. GCS verbal subscore is 5. GCS motor subscore is 6.  Skin: Skin is warm and dry. He is not diaphoretic. No cyanosis. No pallor.  Psychiatric: He has a normal mood and affect.  Nursing note and vitals reviewed.   ED Course  Procedures (including critical care time) Labs Review Labs Reviewed  BASIC METABOLIC PANEL - Abnormal; Notable for the following:    Glucose, Bld 142 (*)    BUN <5 (*)    All other components within normal limits  CBC  I-STAT TROPOININ, ED    Imaging Review Dg Chest 2 View  11/04/2014   CLINICAL DATA:  Mid chest pain and shortness of breath with headache and congestion.  EXAM: CHEST  2 VIEW  COMPARISON:  07/22/2014  FINDINGS: Lungs are adequately inflated without focal consolidation or effusion. There is mild stable cardiomegaly. There are degenerative changes of the spine.  IMPRESSION: No active cardiopulmonary disease.  Mild stable cardiomegaly.   Electronically Signed   By: Marin Olp M.D.   On: 11/04/2014 07:43     EKG Interpretation   Date/Time:  Wednesday Nov 04 2014 06:45:22 EDT Ventricular Rate:  74 PR Interval:  189 QRS Duration: 97 QT Interval:  447 QTC Calculation: 496 R Axis:   -9 Text Interpretation:  Sinus rhythm Borderline T abnormalities, anterior  leads Borderline prolonged QT interval Confirmed by DELO  MD, DOUGLAS  ([id number]) on 11/04/2014 8:43:46 AM       7:30 AM Patient seen and examined. Work-up initiated. Medications ordered. EKG  reviewed.   Vital signs reviewed and are as follows: BP 181/90 mmHg  Pulse 72  Temp(Src) 97.8 F (36.6 C) (Oral)  Resp 16  Ht '5\' 10"'$  (1.778 m)  Wt 218 lb (98.884 kg)  BMI 31.28 kg/m2  SpO2 99%  8:02 AM Pt updated. HA improved. No CP. We discussed reassuring EKG and labs.   8:33 AM Patient discussed with Dr. Stark Jock who has seen. Agrees patient is stable for discharge to home.   Patient was counseled to return with severe chest pain, especially if the pain is crushing or pressure-like and spreads to the arms, back, neck, or jaw, or if they have sweating, nausea, or shortness of breath with the pain. They were encouraged to call 911 with these symptoms.   They were also told to return if their chest pain gets worse and does not go away with rest, they have an attack of chest pain lasting longer than usual despite rest and treatment with the medications their caregiver has prescribed, if they wake from sleep with chest pain or shortness of breath, if they feel dizzy or faint, if they have chest pain not typical of their usual pain, or if they have any other emergent concerns regarding their health.  Patient counseled to return if they have weakness in their arms or legs, slurred speech, trouble walking or talking, confusion, trouble with their balance, or if they have any other concerns. Patient verbalizes understanding and agrees with plan.   The patient verbalized understanding and agreed.     MDM   Final diagnoses:  Chest pain, unspecified chest pain type  Cough  Chronic nonintractable headache, unspecified headache type   Chest pain/cough: previous neg myoview. EKG today is unchanged. Troponin is negative. Chest pain continues to be atypical. No concerning history for aortic dissection or aneurysm, PE. In fact, patient is asymptomatic from a chest pain standpoint during ED stay here. Workup completed. Do not feel the patient needs admission for further evaluation given his past  history, similar symptoms, and reassuring workup today.  Headache: Patient is under the care of neurology for this. No neurological deficits. This is not a new headache. No thunderclap or other concerning signs of an acute intracranial bleed. Symptoms have been ongoing for one month. Encourage patient to continue nortriptyline and to follow-up as needed.  No dangerous or life-threatening conditions suspected or identified by history, physical exam, and by work-up. No indications for hospitalization identified.       Carlisle Cater, PA-C 11/04/14 6803  Veryl Speak, MD 11/04/14 0930

## 2014-11-06 ENCOUNTER — Encounter: Payer: Self-pay | Admitting: Internal Medicine

## 2014-11-06 ENCOUNTER — Ambulatory Visit (INDEPENDENT_AMBULATORY_CARE_PROVIDER_SITE_OTHER): Payer: Medicare Other | Admitting: Internal Medicine

## 2014-11-06 ENCOUNTER — Other Ambulatory Visit (INDEPENDENT_AMBULATORY_CARE_PROVIDER_SITE_OTHER): Payer: Medicare Other

## 2014-11-06 VITALS — BP 160/90 | HR 84 | Ht 70.0 in | Wt 211.0 lb

## 2014-11-06 DIAGNOSIS — J449 Chronic obstructive pulmonary disease, unspecified: Secondary | ICD-10-CM | POA: Diagnosis not present

## 2014-11-06 DIAGNOSIS — I10 Essential (primary) hypertension: Secondary | ICD-10-CM | POA: Diagnosis not present

## 2014-11-06 DIAGNOSIS — R0600 Dyspnea, unspecified: Secondary | ICD-10-CM | POA: Diagnosis not present

## 2014-11-06 LAB — CBC WITH DIFFERENTIAL/PLATELET
Basophils Absolute: 0 10*3/uL (ref 0.0–0.1)
Basophils Relative: 0.5 % (ref 0.0–3.0)
EOS ABS: 0.1 10*3/uL (ref 0.0–0.7)
Eosinophils Relative: 1.7 % (ref 0.0–5.0)
HEMATOCRIT: 46.9 % (ref 39.0–52.0)
HEMOGLOBIN: 15.6 g/dL (ref 13.0–17.0)
LYMPHS ABS: 3.7 10*3/uL (ref 0.7–4.0)
Lymphocytes Relative: 42.7 % (ref 12.0–46.0)
MCHC: 33.2 g/dL (ref 30.0–36.0)
MCV: 92.8 fl (ref 78.0–100.0)
Monocytes Absolute: 0.9 10*3/uL (ref 0.1–1.0)
Monocytes Relative: 10.4 % (ref 3.0–12.0)
NEUTROS PCT: 44.7 % (ref 43.0–77.0)
Neutro Abs: 3.8 10*3/uL (ref 1.4–7.7)
Platelets: 271 10*3/uL (ref 150.0–400.0)
RBC: 5.05 Mil/uL (ref 4.22–5.81)
RDW: 15.4 % (ref 11.5–15.5)
WBC: 8.6 10*3/uL (ref 4.0–10.5)

## 2014-11-06 LAB — BASIC METABOLIC PANEL
BUN: 7 mg/dL (ref 6–23)
CALCIUM: 9.5 mg/dL (ref 8.4–10.5)
CO2: 28 mEq/L (ref 19–32)
CREATININE: 0.79 mg/dL (ref 0.40–1.50)
Chloride: 103 mEq/L (ref 96–112)
GFR: 123.69 mL/min (ref >60.00)
Glucose, Bld: 108 mg/dL — ABNORMAL HIGH (ref 70–99)
POTASSIUM: 3.7 meq/L (ref 3.5–5.1)
SODIUM: 137 meq/L (ref 135–145)

## 2014-11-06 LAB — BRAIN NATRIURETIC PEPTIDE: Pro B Natriuretic peptide (BNP): 23 pg/mL (ref 0.0–100.0)

## 2014-11-06 LAB — TSH: TSH: 1.03 u[IU]/mL (ref 0.35–4.50)

## 2014-11-06 LAB — SEDIMENTATION RATE: Sed Rate: 4 mm/hr (ref 0–22)

## 2014-11-06 MED ORDER — VALSARTAN-HYDROCHLOROTHIAZIDE 160-25 MG PO TABS: 1.0000 | ORAL_TABLET | Freq: Every day | ORAL | Status: DC

## 2014-11-06 MED ORDER — PANTOPRAZOLE SODIUM 40 MG PO TBEC: 40.0000 mg | DELAYED_RELEASE_TABLET | Freq: Every day | ORAL | Status: DC

## 2014-11-06 MED ORDER — FAMOTIDINE 20 MG PO TABS: ORAL_TABLET | ORAL | Status: DC

## 2014-11-06 NOTE — Progress Notes (Signed)
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 Advice Only    from Andre. Johnsie [name]; CP that goes from RT to LT side x5 months; no cough spits up white mucus mainly at night; SOB w/activity.  every night when lies down has immediate choking> coughing x months Walking at [facility] ok, can't rake leaves  X 5 min or do [employer] s giving out due to sob  Confused with all the meds he's taking for gerd "they don't work" Cp is paroxysmal, more common when lies down p a big meal, some better on gerd rx, migrates across ant chest R to L. No pleuritic component.  rec Stop lisinopril Start avapro (valsartan) 160 mg daily  GERD diet Add pepcid ac 20 mg one at bedtime Call me with the name of the pill you are taking and I will tell you what to do with it > confirmed lisinopril  Please schedule a follow up office visit in 6 weeks, call sooner if needed with all active medicines with you     09/01/2014 f/u ov/Andre [name] re: cough gone, now cc sob  Chief Complaint  Patient presents with  . Acute Visit    Increased SOB and HA x 2 weeks.   cough is completely gone and lies flat ok/ no noct symptoms  Sob only with exertion like raking leaves, last did this ok 6 m ago [name] tends to be in afternoon, never am's or waking him p hs  Not using gerd rx as rec rec Pantoprazole (protonix) 40 mg   Take 30-60 min before first meal of the day and Pepcid 20 mg one bedtime until return to office - this is the best way to tell whether stomach acid is contributing to your problem. GERD   Cut down on coffee and  try midrin one every 4 hours as needed for headache  Please remember to go to the lab  department downstairs for your tests - we will call you with the results when they are available. Please schedule a follow up office visit in 4 weeks, sooner if needed    Add did not go to lab     11/04/14 to ER with CP/ r/o MI    11/06/2014 acute  ov/Andre [name] re: new noct pattern nightly / cp and sob x since last ov never took gerd rx  Chief Complaint  Patient presents with  . Follow-up    [name] increased SOB, HA and cough since his last visit. He also c/o CP- wakes up him early every am and then he can not get back to sleep.   [address] doe every time he tries, just about every night cough/ choking around 2 am / very easily confused with meds/ not sure really what he's taking Cp component is minimal and "like a stinging sensation" mid sternum just as common at rest as with exertion    No obvious day to day or daytime variabilty or assoc excess/purulent sputum or   chest tightness, subjective wheeze  overt sinus or hb symptoms. No unusual exp hx or h/o childhood pna/ asthma or knowledge of premature birth.  Sleeping ok without nocturnal  or early am exacerbation  of respiratory  c/o's or need for noct saba. Also denies any obvious fluctuation of symptoms with weather or environmental changes or other aggravating or alleviating factors except as outlined above   Current Medications, Allergies, Complete Past Medical History, Past Surgical History, Family History, and Social History were reviewed in [employer] record.  ROS  The following are not active complaints unless bolded sore throat, dysphagia, dental problems, itching, sneezing,  nasal congestion or excess/ purulent secretions, ear ache,   fever, chills, sweats, unintended wt loss, classically  pleuritic or exertional cp, hemoptysis,  orthopnea pnd or leg swelling, presyncope, palpitations, heartburn, abdominal pain, anorexia,  nausea, vomiting, diarrhea  or change in bowel or urinary habits, change in stools or urine, dysuria,hematuria,  rash, arthralgias, visual complaints, headache, numbness weakness or ataxia or problems with walking or coordination,  change in mood/affect or memory.             Objective:   Physical Exam  amb minimally hoarse bm nad   11/06/14           211 Wt Readings from Last 3 Encounters:  08/10/14 210 lb (95.255 kg)  07/22/14 205 lb (92.987 kg)  07/15/14 205 lb (92.987 kg)    Vital signs reviewed  HEENT: nl dentition, turbinates, and orophanx. Nl external ear canals without cough reflex   NECK :  without JVD/Nodes/TM/ nl carotid upstrokes bilaterally   LUNGS: no acc muscle use, clear to A and P bilaterally without cough on insp or exp maneuvers   CV:  RRR  no s3 or murmur or increase in P2, no edema   ABD:  soft and nontender with nl excursion in the supine position. No bruits or organomegaly, bowel sounds nl  MS:  warm without deformities, calf tenderness, cyanosis or clubbing  SKIN: warm and dry without lesions    NEURO:  alert, approp, no deficits      I personally reviewed images and agree with radiology impression as follows:  CXR:  11/04/14  No active cardiopulmonary disease. Mild stable cardiomegaly.    Labs ordered/ reviewed:    Lab 11/04/14 0710 11/06/14 1520  NA 137 137  K 3.5 3.7  CL 106 103  CO2 24 28  BUN <5* 7  CREATININE 0.68 0.79  GLUCOSE 142* 108*     Lab 11/04/14 0710 11/06/14 1520  HGB 15.1 15.6  HCT 46.5 46.9  WBC 5.6 8.6  PLT 207 271.0     Lab Results  Component Value Date   TSH 1.03 11/06/2014     Lab Results  Component Value Date   PROBNP 23.0 11/06/2014     Lab Results  Component Value Date   ESRSEDRATE 4 11/06/2014   POCTSEDRATE 3 08/28/2013    ekg p walking 3 laps with "chest stinging" no ischemic changes      Assessment:       Outpatient Encounter Prescriptions as of 11/06/2014  Medication Sig  .  aspirin EC 81 MG tablet Take 81 mg by mouth daily.   Marland Kitchen guaifenesin (ROBITUSSIN) 100 MG/5ML syrup Take 5-10 mLs (100-200 mg total) by mouth every 4 (four) hours as needed for cough.  . nortriptyline (PAMELOR) 25 MG capsule Take 1 capsule (25 mg total) by mouth at bedtime.  . [DISCONTINUED] valsartan (DIOVAN) 160 MG tablet Take  160 mg by mouth daily.  . famotidine (PEPCID) 20 MG tablet One at bedtime  . pantoprazole (PROTONIX) 40 MG tablet Take 1 tablet (40 mg total) by mouth daily. Take 30-60 min before first meal of the day  . valsartan-hydrochlorothiazide (DIOVAN HCT) 160-25 MG per tablet Take 1 tablet by mouth daily.  . [DISCONTINUED] pantoprazole (PROTONIX) 40 MG tablet Take 40 mg by mouth daily.  . [DISCONTINUED] trimethoprim-polymyxin b (POLYTRIM) ophthalmic solution Place 1 drop into the left eye every 4 (four) hours.   No facility-administered encounter medications on file as of 11/06/2014.

## 2014-11-06 NOTE — Patient Instructions (Signed)
Nortriptylline Take x 2 at bedtime  Change the valsartan to valsartan-hct 160/25  Pantoprazole (protonix) 40 mg   Take  30-60 min before first meal of the day and Pepcid (famotidine)  20 mg one @  bedtime    GERD (REFLUX)  is an extremely common cause of respiratory symptoms just like yours , many times with no obvious heartburn at all.    It can be treated with medication, but also with lifestyle changes including avoidance of late meals, elevation of the head of your bed (ideally with 6 inch  bed blocks) excessive alcohol, smoking cessation, and avoid fatty foods, chocolate, peppermint, colas, red wine, and acidic juices such as orange juice.  NO MINT OR MENTHOL PRODUCTS SO NO COUGH DROPS  USE SUGARLESS CANDY INSTEAD (Jolley ranchers or Stover's or Life Savers) or even ice chips will also do - the key is to swallow to prevent all throat clearing. NO OIL BASED VITAMINS - use powdered substitutes.  Please remember to go to the lab  department downstairs for your tests - we will call you with the results when they are available.   If you are satisfied with your treatment plan,  let your doctor know and he/she can either refill your medications or you can return here when your prescription runs out.     If in any way you are not 100% satisfied,  please tell us.  If 100% better, tell your friends!  Pulmonary follow up is as needed

## 2014-11-06 NOTE — Progress Notes (Signed)
Quick Note:  Spoke with pt and notified of results per Dr. [name]. Pt verbalized understanding and denied any questions.  ______ 

## 2014-11-08 ENCOUNTER — Encounter: Payer: Self-pay | Admitting: Internal Medicine

## 2014-11-08 DIAGNOSIS — J449 Chronic obstructive pulmonary disease, unspecified: Secondary | ICD-10-CM | POA: Insufficient documentation

## 2014-11-08 NOTE — Assessment & Plan Note (Signed)
PFTs  07/29/14  FEV1  1.99 (67%) ratio 79% and dlco 62 corrects to 93 - 09/01/2014  Walked RA x 3 laps @ 185 ft each stopped due to end of study, nl pace, no desats   - 11/06/2014  Walked RA x 3 laps @ 185 ft each stopped due to end of study, nl pace, no desats, mild chest "stinging"  I had an extended discussion with the patient reviewing all relevant studies completed to date and  lasting 15 to 20 minutes of a 25 minute visit on the following ongoing concerns:  No evidence of a pulmonary mechanism for dyspnea and features are not typical of angina either as he has just as much problem at rest/ noct as with exertion and never followed the original recs for GERD Rx and gerd is still the most likely explanation for his symptoms  Each maintenance medication was reviewed in detail including most importantly the difference between maintenance and as needed and under what circumstances the prns are to be used.  Please see instructions for details which were reviewed in writing and the patient given a copy.

## 2014-11-08 NOTE — Assessment & Plan Note (Addendum)
dc'd acei 08/10/14 due to unexplained sob/cough > not clear this helped and note bp not ideal but don't want to muddy the water as yet and add back any med that could confuse the interpretation of response to emprical trial (see dyspnea)   rec   Change diovan to diovan hct 160//25 and f/u primary care with referral back to cards again if exertional symptoms don't improve with max gerd rx

## 2014-11-08 NOTE — Assessment & Plan Note (Signed)
PFTs  07/29/14  FEV1  1.99 (67%) ratio 79% and dlco 62 corrects to 93 - reports quit smoking 10/21/14    I reviewed the Fletcher curve with the patient that basically indicates  if you quit smoking when your best day FEV1 is still well preserved (as is very clearly  the case here)  it is highly unlikely you will progress to severe disease and informed the patient there was no medication on the market that has proven to alter the curve/ its downward trajectory  or the likelihood of progression of their disease.  Therefore   maintaining abstinence is the most important aspect of his care at this point, not choice of inhalers or for that matter, doctors, so pulmonary f/u is prn

## 2014-12-07 ENCOUNTER — Ambulatory Visit (INDEPENDENT_AMBULATORY_CARE_PROVIDER_SITE_OTHER): Payer: Self-pay | Admitting: Neurology

## 2014-12-07 ENCOUNTER — Encounter: Payer: Self-pay | Admitting: Neurology

## 2014-12-07 ENCOUNTER — Ambulatory Visit: Payer: Self-pay | Admitting: Neurology

## 2014-12-07 VITALS — BP 121/77 | HR 87 | Ht 70.0 in | Wt 206.0 lb

## 2014-12-07 DIAGNOSIS — R51 Headache: Secondary | ICD-10-CM

## 2014-12-07 NOTE — Progress Notes (Signed)
Patient left without be seen.

## 2014-12-24 ENCOUNTER — Ambulatory Visit (INDEPENDENT_AMBULATORY_CARE_PROVIDER_SITE_OTHER): Payer: Medicare Other | Admitting: Neurology

## 2014-12-24 ENCOUNTER — Encounter: Payer: Self-pay | Admitting: Neurology

## 2014-12-24 DIAGNOSIS — G44201 Tension-type headache, unspecified, intractable: Secondary | ICD-10-CM

## 2014-12-24 MED ORDER — NORTRIPTYLINE HCL 25 MG PO CAPS: 25.0000 mg | ORAL_CAPSULE | Freq: Every day | ORAL | Status: DC

## 2014-12-24 NOTE — Progress Notes (Signed)
Chief Complaint  Patient presents with  . Headache    Says he has been taking nortriptyline '25mg'$  each night at bedtime.  He estimates still having five headache days per week.    [facility]  PATIENT: Andre [name] DOB: 02-08-1941  Mr. dose Zwart returns for follow-up for his headaches. He was initially evaluated in 11/30/11 for headaches with a persistent right temporal headache.  He was drunk, fell in Novemeber of 2012 and had LOC and abrasion to his face on the right,. He has had intermittent headaches since that time lasting 15-20 minutes several times a week. He has been hypertensive for 20 years, is on  B/P meds does not know the name. Also has a hx of GERD and is on meds, does not know the name,    He had a CT scan of the brain in 07/2011, which showed small Left pontine lacunar infarct. MRI of the brain 12/27/11 with chronic left pontine lacunar ischemic infarction, and chronic SVD. Mild atrophy also noted. MRA of the head normal. MRA of the neck demonstarated right vertebral artery origin is mildly narrowed, may be due to atherosclerosis.  Bil internal carotid arteries have no stenosis.   Depakote ER '250mg'$  qhs has been very effective for his headache prevention, but after he has run out of medication for about 6 months now, now he has recurrent headaches, mild bilateral frontal pressure headaches,   He denies visual change, no significant gait difficulty  UPDATE March 16th 2015:  Since last visit, he continues to have frequent daily headaches, bilateral frontal, he is taking Depakote ER 500 mg without significant side effect, without much change of his headache either, he continued to drink occasionally, denies recent history of head trauma, denied gait difficulty, mild blurry vision when looking closeup object  UPDATE Oct 22nd 2015:  I have reviewed MRI of the brain in August 29 2013, periventricular small vessel disease, evidence of left and tentorium pontine  lacunar infarction, no acute lesions,  He is taking amlodipine, lisinopril, daily aspirin, but no longer taking Depakote, has run out of refills  He continued to complain almost constant right frontal area headaches, 8 out of 10, took Aleve, aspirin, without helping,  He continued to drink occasionally, smokes daily, lives alone, drive himself to clinic today  UPDATE December 24 2014: He has been taken ASA as needed for his headaches, he gets headaches 2-4 times each week, lasting 30 minutes.  He is now taking nortriptyline 25 mg qhs, which did help his headaches some.  He drives here himself, he drinks occasionally  REVIEW OF SYSTEMS: Full 14 system review of systems performed and notable only for headaches, sleepiness, chest pain  ALLERGIES: No Known Allergies  HOME MEDICATIONS: Outpatient Prescriptions Prior to Visit  Medication Sig Dispense Refill  . aspirin EC 81 MG tablet Take 81 mg by mouth daily.     . famotidine (PEPCID) 20 MG tablet One at bedtime 30 tablet 2  . nortriptyline (PAMELOR) 25 MG capsule Take 1 capsule (25 mg total) by mouth at bedtime. 30 capsule 3  . valsartan-hydrochlorothiazide (DIOVAN HCT) 160-25 MG per tablet Take 1 tablet by mouth daily. 30 tablet 11  . pantoprazole (PROTONIX) 40 MG tablet Take 1 tablet (40 mg total) by mouth daily. Take 30-60 min before first meal of the day 30 tablet 2   No facility-administered medications prior to visit.    PAST MEDICAL HISTORY: Past Medical History  Diagnosis Date  . Arthritis   .  Hypertension   . Rectal cancer   . Weakness   . HA (headache)   . TIA (transient ischemic attack)   . Chest pain   . GERD (gastroesophageal reflux disease)     PAST SURGICAL HISTORY: Past Surgical History  Procedure Laterality Date  . Surgical excision of rectal cancer      FAMILY HISTORY: Family History  Problem Relation Age of Onset  . Hypertension Mother   . Cancer Mother     SOCIAL HISTORY:  History   Social  History  . Marital Status: Single    Spouse Name: N/A  . Number of Children: 11  . Years of Education: 12   Occupational History  .      retired   Social History Main Topics  . Smoking status: Former Smoker -- 0.50 packs/day for 47 years    Types: Cigarettes    Quit date: 10/21/2014  . Smokeless tobacco: Never Used  . Alcohol Use: 0.0 oz/week    0 Standard drinks or equivalent per week     Comment: heavy drinker, last alcohol was yesterday   . Drug Use: No  . Sexual Activity: Not on file   Other Topics Concern  . Not on file   Social History Narrative   Patient is single, retired   Patient is right handed   Education level is high school   Caffeine consumption is 1 cup daily     PHYSICAL EXAM   Filed Vitals:   12/24/14 1012  BP: 117/77  Pulse: 88  Height: '5\' 10"'$  (1.778 m)  Weight: 208 lb (94.348 kg)    Not recorded      Body mass index is 29.84 kg/(m^2).   Generalized: In no acute distress  Neck: Supple, no carotid bruits   Cardiac: Regular rate rhythm  Pulmonary: Clear to auscultation bilaterally  Musculoskeletal: No deformity  Neurological examination  Mentation: Alert oriented to time, place, history taking, and causual conversation, depressed looking elderly male.  Cranial nerve II-XII: Pupils were equal round reactive to light extraocular movements were full, Visual field were full on confrontational test. Bilateral fundi were sharp.  Facial sensation and strength were normal. Hearing was intact to finger rubbing bilaterally. Uvula tongue midline.  head turning and shoulder shrug and were normal and symmetric.Tongue protrusion into cheek strength was normal.  Motor: normal tone, bulk and strength.  Sensory: Intact to fine touch Coordination: Normal finger to nose, heel-to-shin bilaterally there was no truncal ataxia  Gait: Rising up from seated position without assistance, normal stance, mild difficulty performing tiptoe, heel and tandem  walking Romberg signs: Negative  Deep tendon reflexes: Brachioradialis 2/2, biceps 2/2, triceps 2/2, patellar 2/2, Achilles  trace , plantar responses were flexor bilaterally.   DIAGNOSTIC DATA (LABS, IMAGING, TESTING) - I reviewed patient records, labs, notes, testing and imaging myself where available.  Lab Results  Component Value Date   WBC 8.6 11/06/2014   HGB 15.6 11/06/2014   HCT 46.9 11/06/2014   MCV 92.8 11/06/2014   PLT 271.0 11/06/2014      Component Value Date/Time   NA 137 11/06/2014 1520   K 3.7 11/06/2014 1520   CL 103 11/06/2014 1520   CO2 28 11/06/2014 1520   GLUCOSE 108* 11/06/2014 1520   BUN 7 11/06/2014 1520   CREATININE 0.79 11/06/2014 1520   CREATININE 0.70 08/28/2013 1210   CALCIUM 9.5 11/06/2014 1520   PROT 7.3 07/07/2014 0835   ALBUMIN 3.9 07/07/2014 0835   AST 46* 07/07/2014  0835   ALT 38 07/07/2014 0835   ALKPHOS 106 07/07/2014 0835   BILITOT 1.0 07/07/2014 0835   GFRNONAA >60 11/04/2014 0710   GFRNONAA >89 08/28/2013 1210   GFRAA >60 11/04/2014 0710   GFRAA >89 08/28/2013 1210   Lab Results  Component Value Date   CHOL 143 08/22/2013   HDL 37* 08/22/2013   LDLCALC 83 08/22/2013   TRIG 114 08/22/2013   CHOLHDL 3.9 08/22/2013   Lab Results  Component Value Date   HGBA1C 6.0 02/23/2014   Lab Results  Component Value Date   VITAMINB12 258 02/23/2014   Lab Results  Component Value Date   TSH 1.03 11/06/2014      ASSESSMENT AND PLAN   74 years old [place] American male, with previous history of head trauma, vascular risk factor of hypertension, daily alcohol use, smoking, MRI showed small vessel disease, pontine lacunar infarction, presenting with persistent headaches,  1. Tension headaches, improved by nortriptyline, continue 25 mg daily 2, NSAIDs as needed Return to clinic for new issue  Marcial Pacas, M.D. Ph.D.  [facility] [address], [place], [place] [address] [phone]

## 2015-01-25 ENCOUNTER — Ambulatory Visit (INDEPENDENT_AMBULATORY_CARE_PROVIDER_SITE_OTHER): Payer: Medicare Other

## 2015-01-25 ENCOUNTER — Ambulatory Visit (INDEPENDENT_AMBULATORY_CARE_PROVIDER_SITE_OTHER): Payer: Medicare Other | Admitting: Emergency Medicine

## 2015-01-25 ENCOUNTER — Ambulatory Visit: Payer: Medicare Other | Admitting: Internal Medicine

## 2015-01-25 VITALS — BP 116/60 | HR 78 | Temp 98.0°F | Resp 18 | Ht 71.0 in | Wt 206.0 lb

## 2015-01-25 DIAGNOSIS — R0602 Shortness of breath: Secondary | ICD-10-CM | POA: Diagnosis not present

## 2015-01-25 DIAGNOSIS — R059 Cough, unspecified: Secondary | ICD-10-CM

## 2015-01-25 DIAGNOSIS — R0789 Other chest pain: Secondary | ICD-10-CM | POA: Diagnosis not present

## 2015-01-25 DIAGNOSIS — R05 Cough: Secondary | ICD-10-CM

## 2015-01-25 DIAGNOSIS — R0600 Dyspnea, unspecified: Secondary | ICD-10-CM | POA: Diagnosis not present

## 2015-01-25 MED ORDER — PREDNISONE 10 MG PO TABS: ORAL_TABLET | ORAL | Status: DC

## 2015-01-25 NOTE — Progress Notes (Addendum)
Patient ID: Andre [name], male   DOB: 1940-06-15, 74 y.o.   MRN: [id number]    This chart was scribed for Nena Jordan, MD by [facility], medical scribe at [facility].The patient was seen in exam room 10 and the patient's care was started at 8:14 AM.  Chief Complaint:  Chief Complaint  Patient presents with  . Cough    white mucous mostly nights over a mth now  . chest congestion   HPI: Andre [name] is a 74 y.o. male who reports to [facility] today complaining of a cough with associated chest congestion, ongoing for one month. His cough is producing a white phlegm. Still smokes cigarettes but has cut down. No wheezing. FEVI of 67% on pulmonary function test on 07/29/14, has a history of COPD. Normal cath in 2013 and mild veiw of the heart march 2015 which was normal.  Past Medical History  Diagnosis Date  . Arthritis   . Hypertension   . Rectal cancer   . Weakness   . HA (headache)   . TIA (transient ischemic attack)   . Chest pain   . GERD (gastroesophageal reflux disease)    Past Surgical History  Procedure Laterality Date  . Surgical excision of rectal cancer     Social History   Social History  . Marital Status: Single    Spouse Name: N/A  . Number of Children: 11  . Years of Education: 12   Occupational History  .      retired   Social History Main Topics  . Smoking status: Current Some Day Smoker -- 0.50 packs/day for 47 years    Types: Cigarettes    Last Attempt to Quit: 10/21/2014  . Smokeless tobacco: Never Used  . Alcohol Use: 4.8 - 6.0 oz/week    8-10 Standard drinks or equivalent per week     Comment: heavy drinker, last alcohol was yesterday   . Drug Use: No  . Sexual Activity: Not Asked   Other Topics Concern  . None   Social History Narrative   Patient is single, retired   Patient is right handed   Education level is high school   Caffeine consumption is 1 cup daily   Family History  Problem Relation Age of Onset  .  Hypertension Mother   . Cancer Mother    No Known Allergies Prior to Admission medications   Medication Sig Start Date End Date Taking? Authorizing Provider  aspirin EC 81 MG tablet Take 81 mg by mouth daily.    Yes Historical Provider, MD  famotidine (PEPCID) 20 MG tablet One at bedtime 11/06/14  Yes Tanda Rockers, MD  nortriptyline (PAMELOR) 25 MG capsule Take 1 capsule (25 mg total) by mouth at bedtime. 12/24/14  Yes Marcial Pacas, MD  valsartan-hydrochlorothiazide (DIOVAN HCT) 160-25 MG per tablet Take 1 tablet by mouth daily. 11/06/14  Yes Tanda Rockers, MD     ROS: The patient denies fevers, chills, night sweats, unintentional weight loss, chest pain, palpitations, wheezing, dyspnea on exertion, nausea, vomiting, abdominal pain, dysuria, hematuria, melena, numbness, weakness, or tingling.   All other systems have been reviewed and were otherwise negative with the exception of those mentioned in the HPI and as above.    PHYSICAL EXAM: Filed Vitals:   01/25/15 0809  BP: 116/60  Pulse: 78  Temp: 98 F (36.7 C)  Resp: 18   Body mass index is 28.74 kg/(m^2).  General: Alert, no acute distress HEENT:  Normocephalic, atraumatic, oropharynx patent. Eye: Juliette Mangle [facility] Cardiovascular:  Regular rate and rhythm, no rubs murmurs or gallops.  No Carotid bruits, radial pulse intact. No pedal edema.  Respiratory: Decreased breath sounds in the bases. Abdominal: No organomegaly, abdomen is soft and non-tender, positive bowel sounds.  No masses. Musculoskeletal: Gait intact. No edema, tenderness Skin: No rashes. Neurologic: Facial musculature symmetric. Psychiatric: Patient acts appropriately throughout our interaction. Lymphatic: No cervical or submandibular lymphadenopathy  LABS: Results for orders placed or performed in visit on 97/98/92  Basic metabolic panel  Result Value Ref Range   Sodium 137 135 - 145 mEq/L   Potassium 3.7 3.5 - 5.1 mEq/L   Chloride 103 96 - 112 mEq/L   CO2 28  19 - 32 mEq/L   Glucose, Bld 108 (H) 70 - 99 mg/dL   BUN 7 6 - 23 mg/dL   Creatinine, Ser 0.79 0.40 - 1.50 mg/dL   Calcium 9.5 8.4 - 10.5 mg/dL   GFR 123.69 >60.00 mL/min  CBC with Differential/Platelet  Result Value Ref Range   WBC 8.6 4.0 - 10.5 K/uL   RBC 5.05 4.22 - 5.81 Mil/uL   Hemoglobin 15.6 13.0 - 17.0 g/dL   HCT 46.9 39.0 - 52.0 %   MCV 92.8 78.0 - 100.0 fl   MCHC 33.2 30.0 - 36.0 g/dL   RDW 15.4 11.5 - 15.5 %   Platelets 271.0 150.0 - 400.0 K/uL   Neutrophils Relative % 44.7 43.0 - 77.0 %   Lymphocytes Relative 42.7 12.0 - 46.0 %   Monocytes Relative 10.4 3.0 - 12.0 %   Eosinophils Relative 1.7 0.0 - 5.0 %   Basophils Relative 0.5 0.0 - 3.0 %   Neutro Abs 3.8 1.4 - 7.7 K/uL   Lymphs Abs 3.7 0.7 - 4.0 K/uL   Monocytes Absolute 0.9 0.1 - 1.0 K/uL   Eosinophils Absolute 0.1 0.0 - 0.7 K/uL   Basophils Absolute 0.0 0.0 - 0.1 K/uL  Brain natriuretic peptide  Result Value Ref Range   Pro B Natriuretic peptide (BNP) 23.0 0.0 - 100.0 pg/mL  TSH  Result Value Ref Range   TSH 1.03 0.35 - 4.50 uIU/mL  Sedimentation rate  Result Value Ref Range   Sed Rate 4 0 - 22 mm/hr   EKG/XRAY:   Primary read interpreted by Dr. Everlene Farrier at [facility]. Chest x-ray is unchanged from previous. There is no acute pulmonary disease. EKG shows no acute changes. I'm not sure about the accuracy of his pulmonary function test. His FEV1 measured 28% however the results were felt to be unreliable.  ASSESSMENT/PLAN: I think it would be reasonable to give him a short burst of prednisone. I made a repeat referral back to the pulmonary specialist for their evaluation. From his history and physical exam this  does not appear that his chest discomfort is cardiac related. I have also scheduled him for a CT of the chest because of his long history of smoking.  Gross sideeffects, risk and benefits, and alternatives of medications d/w patient. Patient is aware that all medications have potential sideeffects and we are  unable to predict every sideeffect or drug-drug interaction that may occur.I personally performed the services described in this documentation, which was scribed in my presence. The recorded information has been reviewed and is accurate.  Nena Jordan, MD    Arlyss Queen MD 01/25/2015 8:13 AM

## 2015-02-04 ENCOUNTER — Other Ambulatory Visit: Payer: Medicare Other

## 2015-02-05 ENCOUNTER — Ambulatory Visit: Payer: Medicare Other | Admitting: Internal Medicine

## 2015-02-09 ENCOUNTER — Encounter: Payer: Self-pay | Admitting: Internal Medicine

## 2015-02-09 ENCOUNTER — Ambulatory Visit (INDEPENDENT_AMBULATORY_CARE_PROVIDER_SITE_OTHER): Payer: Medicare Other | Admitting: Internal Medicine

## 2015-02-09 VITALS — BP 126/68 | HR 74 | Ht 71.0 in | Wt 205.8 lb

## 2015-02-09 DIAGNOSIS — Z720 Tobacco use: Secondary | ICD-10-CM

## 2015-02-09 DIAGNOSIS — J449 Chronic obstructive pulmonary disease, unspecified: Secondary | ICD-10-CM

## 2015-02-09 DIAGNOSIS — R0600 Dyspnea, unspecified: Secondary | ICD-10-CM | POA: Diagnosis not present

## 2015-02-09 DIAGNOSIS — F17210 Nicotine dependence, cigarettes, uncomplicated: Secondary | ICD-10-CM

## 2015-02-09 NOTE — Patient Instructions (Addendum)
Add  chlortrimeton (chlorpheniramine) 4 mg  X 2 at bedtime to pepcid 20 mg each available at bedtime  Continue Pantoprazole (protonix) 40 mg   Take  30-60 min before first meal of the day    GERD (REFLUX)  is an extremely common cause of respiratory symptoms just like yours , many times with no obvious heartburn at all.    It can be treated with medication, but also with lifestyle changes including elevation of the head of your bed (ideally with 6 inch  bed blocks),  Smoking cessation, avoidance of late meals, excessive alcohol, and avoid fatty foods, chocolate, peppermint, colas, red wine, and acidic juices such as orange juice.  NO MINT OR MENTHOL PRODUCTS SO NO COUGH DROPS  USE SUGARLESS CANDY INSTEAD (Jolley ranchers or Stover's or Life Savers) or even ice chips will also do - the key is to swallow to prevent all throat clearing. NO OIL BASED VITAMINS - use powdered substitutes.    If not better in 2 weeks, return with all active medications in hand

## 2015-02-09 NOTE — Progress Notes (Signed)
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 Advice Only    from Dr. Johnsie Cancel; CP that goes from RT to LT side x5 months; no cough spits up white mucus mainly at night; SOB w/activity.  every night when lies down has immediate choking> coughing x months Walking at [facility] ok, can't rake leaves  X 5 min or do [employer] s giving out due to sob  Confused with all the meds he's taking for gerd "they don't work" Cp is paroxysmal, more common when lies down p a big meal, some better on gerd rx, migrates across ant chest R to L. No pleuritic component.  rec Stop lisinopril Start avapro (valsartan) 160 mg daily  GERD diet Add pepcid ac 20 mg one at bedtime Call me with the name of the pill you are taking and I will tell you what to do with it > confirmed lisinopril  Please schedule a follow up office visit in 6 weeks, call sooner if needed with all active medicines with you     09/01/2014 f/u ov/Debraann Livingstone re: cough gone, now cc sob  Chief Complaint  Patient presents with  . Acute Visit    Increased SOB and HA x 2 weeks.   cough is completely gone and lies flat ok/ no noct symptoms  Sob only with exertion like raking leaves, last did this ok 6 m ago [name] tends to be in afternoon, never am's or waking him p hs  Not using gerd rx as rec rec Pantoprazole (protonix) 40 mg   Take 30-60 min before first meal of the day and Pepcid 20 mg one bedtime until return to office - this is the best way to tell whether stomach acid is contributing to your problem. GERD   Cut down on coffee and try  midrin one every 4 hours as needed for headache  Please remember to go to the lab  department downstairs for your tests - we will call you with the results when they are available. Please schedule a follow up office visit in 4 weeks, sooner if needed    Add did not go to lab     11/04/14 to ER with CP/ r/o MI    11/06/2014 acute  ov/Desa Rech re: new noct pattern nightly / cp and sob x since last ov never took gerd rx  Chief Complaint  Patient presents with  . Follow-up    [name] increased SOB, HA and cough since his last visit. He also c/o CP- wakes up him early every am and then he can not get back to sleep.   31 yards doe every time he tries, just about every night cough/ choking around 2 am / very easily confused with meds/ not sure really what he's taking Cp component is minimal and "like a stinging sensation" mid sternum just as common at rest as with exertion  rec Nortriptylline Take x 2 at bedtime Change the valsartan to valsartan-hct 160/25 Pantoprazole (protonix) 40 mg   Take  30-60  min before first meal of the day and Pepcid (famotidine)  20 mg one @  bedtime   GERD diet    02/09/2015 f/u ov/Dequavious Harshberger re: unexplaind doe / noct sob  Chief Complaint  Patient presents with  . Follow-up    [name] CP and HA on and off x 3 wks. He states not coughing much, but bringing up alot of white sputum.  He describes CP as dull and it is worse after he eats.    improved p last ov but continued to have sleep disruption every night never at hs or when wakes up at 7 am at sual hour, much    worse x 3 weeks Still smoking/ doe x 150 ft. Says brought meds but has more at home - does not have ppi or nortriptylline nor pepcid with him - very easily confused with details of medical care  No obvious day to day or daytime variabilty or assoc excess/purulent sputum or   chest tightness, subjective wheeze overt sinus or hb symptoms. No unusual exp hx or h/o childhood pna/ asthma or knowledge of premature  birth.  Sleeping ok without nocturnal  or early am exacerbation  of respiratory  c/o's or need for noct saba. Also denies any obvious fluctuation of symptoms with weather or environmental changes or other aggravating or alleviating factors except as outlined above   Current Medications, Allergies, Complete Past Medical History, Past Surgical History, Family History, and Social History were reviewed in [employer] record.  ROS  The following are not active complaints unless bolded sore throat, dysphagia, dental problems, itching, sneezing,  nasal congestion or excess/ purulent secretions, ear ache,   fever, chills, sweats, unintended wt loss, classically  pleuritic or exertional cp, hemoptysis,  orthopnea pnd or leg swelling, presyncope, palpitations, heartburn, abdominal pain, anorexia, nausea, vomiting, diarrhea  or change in bowel or urinary habits, change in stools or urine, dysuria,hematuria,  rash, arthralgias, visual complaints, headache, numbness weakness or ataxia or problems with walking or coordination,  change in mood/affect or memory.             Objective:   Physical Exam  amb minimally hoarse bm nad   11/06/14           211 >    02/09/2015   206  Wt Readings from Last 3 Encounters:  08/10/14 210 lb (95.255 kg)  07/22/14 205 lb (92.987 kg)  07/15/14 205 lb (92.987 kg)    Vital signs reviewed  HEENT: nl dentition, turbinates, and orophanx. Nl external ear canals without cough reflex   NECK :  without JVD/Nodes/TM/ nl carotid upstrokes bilaterally   LUNGS: no acc muscle use, clear to A and P bilaterally without cough on insp or exp maneuvers   CV:  RRR  no s3 or murmur or increase in P2, no edema   ABD:  soft and nontender with nl excursion in the supine position. No bruits or organomegaly, bowel sounds nl  MS:  warm without deformities, calf tenderness, cyanosis or clubbing  SKIN: warm and dry without lesions    NEURO:  alert, approp, no  deficits      I personally reviewed images and agree with radiology impression as follows:  CXR:  11/04/14  No active cardiopulmonary disease. Mild stable cardiomegaly.    Labs  reviewed:   Lab 11/04/14 0710 11/06/14 1520  NA 137 137  K 3.5 3.7  CL 106 103  CO2 24 28  BUN <5* 7  CREATININE 0.68 0.79  GLUCOSE 142* 108*     Lab 11/04/14 0710 11/06/14 1520  HGB 15.1 15.6  HCT 46.5 46.9  WBC 5.6 8.6  PLT 207 271.0     Lab Results  Component Value Date   TSH 1.03 11/06/2014     Lab Results  Component Value Date   PROBNP 23.0 11/06/2014     Lab Results  Component Value Date   ESRSEDRATE 4 11/06/2014   POCTSEDRATE 3 08/28/2013    ekg p walking 3 laps with "chest stinging" no ischemic changes      Assessment:

## 2015-02-10 ENCOUNTER — Encounter: Payer: Self-pay | Admitting: Internal Medicine

## 2015-02-10 DIAGNOSIS — F17210 Nicotine dependence, cigarettes, uncomplicated: Secondary | ICD-10-CM | POA: Insufficient documentation

## 2015-02-10 NOTE — Assessment & Plan Note (Signed)
-    PFTs  07/29/14  FEV1  1.99 (67%) ratio 79% and dlco 62 corrects to 93 - Spirometry 02/09/2015 FEV1 2.4 (62%) ratio 76 while symptomatic   No evidence at all supportive diagnosis of COPD or asthma from ladder at this point.

## 2015-02-10 NOTE — Assessment & Plan Note (Signed)

## 2015-02-10 NOTE — Assessment & Plan Note (Addendum)
-   09/01/2014  Walked RA x 3 laps @ 185 ft each stopped due to end of study, nl pace, no desats   - 11/06/2014  Walked RA x 3 laps @ 185 ft each stopped due to end of study, nl pace, no desats, mild chest "stinging"  - max gred rx 11/06/2014 >  ? Followed recs - 02/09/2015  Walked RA x 3 laps @ 185 ft each stopped due to  End of study, nl pace, no sob or desat  But mild chest discomfort > ecg wnl   Symptoms are markedly disproportionate to objective findings and not clear this is a lung problem but pt does appear to have difficult airway management issues. DDX of  difficult airways management all start with A and  include Adherence, Ace Inhibitors, Acid Reflux, Active Sinus Disease, Alpha 1 Antitripsin deficiency, Anxiety masquerading as Airways dz,  ABPA,  allergy(esp in young), Aspiration (esp in elderly), Adverse effects of meds,  Active smokers, A bunch of PE's (a small clot burden can't cause this syndrome unless there is already severe underlying pulm or vascular dz with poor reserve) plus two Bs  = Bronchiectasis and Beta blocker use..and one C= CHF  Adherence is always the initial "prime suspect" and is a multilayered concern that requires a "trust but verify" approach in every patient - starting with knowing how to use medications, especially inhalers, correctly, keeping up with refills and understanding the fundamental difference between maintenance and prns vs those medications only taken for a very short course and then stopped and not refilled.  - Unable to confirm today that he is actually taking the medicines exposed to be because he did not bring all of them with him. If not improving the next step is to return with all medications in hand  Actively smoking, discussed separately  ? Acid (or non-acid) GERD > always difficult to exclude as up to 75% of pts in some series report no assoc GI/ Heartburn symptoms> rec max (24h)  acid suppression and diet restrictions/ reviewed and instructions given  in writing.   ? Anxiety/depression > typically a diagnosis of exclusion but note that he has stopped on his own the nortriptyline and has both headache and unexplained dyspnea and chest discomfort. I suspect this is all anxiety and depression related and I have asked him to restart these and follow-up with his primary care doctor   I had an extended discussion with the patient reviewing all relevant studies completed to date and  lasting 15 to 20 minutes of a 25 minute visit    Each maintenance medication was reviewed in detail including most importantly the difference between maintenance and prns and under what circumstances the prns are to be triggered using an action plan format that is not reflected in the computer generated alphabetically organized AVS.    Please see instructions for details which were reviewed in writing and the patient given a copy highlighting the part that I personally wrote and discussed at today's ov.

## 2015-02-22 ENCOUNTER — Emergency Department (HOSPITAL_COMMUNITY): Payer: Medicare Other

## 2015-02-22 ENCOUNTER — Emergency Department (HOSPITAL_COMMUNITY)
Admission: EM | Admit: 2015-02-22 | Discharge: 2015-02-22 | Disposition: A | Discharge status: Home / Self Care | Payer: Medicare Other | Attending: Emergency Medicine | Admitting: Emergency Medicine

## 2015-02-22 ENCOUNTER — Encounter (HOSPITAL_COMMUNITY): Payer: Self-pay | Admitting: Emergency Medicine

## 2015-02-22 DIAGNOSIS — K219 Gastro-esophageal reflux disease without esophagitis: Secondary | ICD-10-CM | POA: Diagnosis not present

## 2015-02-22 DIAGNOSIS — R05 Cough: Secondary | ICD-10-CM | POA: Diagnosis not present

## 2015-02-22 DIAGNOSIS — Z85048 Personal history of other malignant neoplasm of rectum, rectosigmoid junction, and anus: Secondary | ICD-10-CM | POA: Diagnosis not present

## 2015-02-22 DIAGNOSIS — Z79899 Other long term (current) drug therapy: Secondary | ICD-10-CM | POA: Diagnosis not present

## 2015-02-22 DIAGNOSIS — J984 Other disorders of lung: Secondary | ICD-10-CM | POA: Diagnosis not present

## 2015-02-22 DIAGNOSIS — R918 Other nonspecific abnormal finding of lung field: Secondary | ICD-10-CM | POA: Diagnosis not present

## 2015-02-22 DIAGNOSIS — Z8673 Personal history of transient ischemic attack (TIA), and cerebral infarction without residual deficits: Secondary | ICD-10-CM | POA: Insufficient documentation

## 2015-02-22 DIAGNOSIS — Z8719 Personal history of other diseases of the digestive system: Secondary | ICD-10-CM | POA: Insufficient documentation

## 2015-02-22 DIAGNOSIS — Z720 Tobacco use: Secondary | ICD-10-CM | POA: Diagnosis not present

## 2015-02-22 DIAGNOSIS — R0789 Other chest pain: Secondary | ICD-10-CM | POA: Diagnosis not present

## 2015-02-22 DIAGNOSIS — R079 Chest pain, unspecified: Secondary | ICD-10-CM | POA: Insufficient documentation

## 2015-02-22 DIAGNOSIS — Z7982 Long term (current) use of aspirin: Secondary | ICD-10-CM | POA: Insufficient documentation

## 2015-02-22 DIAGNOSIS — M1990 Unspecified osteoarthritis, unspecified site: Secondary | ICD-10-CM | POA: Diagnosis not present

## 2015-02-22 DIAGNOSIS — I10 Essential (primary) hypertension: Secondary | ICD-10-CM | POA: Diagnosis not present

## 2015-02-22 DIAGNOSIS — Z09 Encounter for follow-up examination after completed treatment for conditions other than malignant neoplasm: Secondary | ICD-10-CM

## 2015-02-22 LAB — CBC
HEMATOCRIT: 45 % (ref 39.0–52.0)
Hemoglobin: 14.9 g/dL (ref 13.0–17.0)
MCH: 31.1 pg (ref 26.0–34.0)
MCHC: 33.1 g/dL (ref 30.0–36.0)
MCV: 93.9 fL (ref 78.0–100.0)
Platelets: 178 10*3/uL (ref 150–400)
RBC: 4.79 MIL/uL (ref 4.22–5.81)
RDW: 15.3 % (ref 11.5–15.5)
WBC: 6.3 10*3/uL (ref 4.0–10.5)

## 2015-02-22 LAB — BASIC METABOLIC PANEL
Anion gap: 11 (ref 5–15)
BUN: 7 mg/dL (ref 6–20)
CHLORIDE: 100 mmol/L — AB (ref 101–111)
CO2: 25 mmol/L (ref 22–32)
Calcium: 9.1 mg/dL (ref 8.9–10.3)
Creatinine, Ser: 0.86 mg/dL (ref 0.61–1.24)
GFR calc Af Amer: >60 mL/min (ref >60)
GFR calc non Af Amer: >60 mL/min (ref >60)
GLUCOSE: 142 mg/dL — AB (ref 65–99)
POTASSIUM: 3.6 mmol/L (ref 3.5–5.1)
Sodium: 136 mmol/L (ref 135–145)

## 2015-02-22 LAB — I-STAT TROPONIN, ED: Troponin i, poc: 0.01 ng/mL (ref 0.00–0.08)

## 2015-02-22 LAB — BRAIN NATRIURETIC PEPTIDE: B NATRIURETIC PEPTIDE 5: 13.5 pg/mL (ref 0.0–100.0)

## 2015-02-22 MED ORDER — FAMOTIDINE 20 MG PO TABS: 20.0000 mg | ORAL_TABLET | Freq: Two times a day (BID) | ORAL | Status: DC

## 2015-02-22 MED ORDER — FAMOTIDINE 20 MG PO TABS
20.0000 mg | ORAL_TABLET | Freq: Once | ORAL | Status: AC
  Administered 2015-02-22: 20 mg via ORAL
  Filled 2015-02-22: qty 1

## 2015-02-22 MED ORDER — GI COCKTAIL ~~LOC~~
30.0000 mL | Freq: Once | ORAL | Status: AC
  Administered 2015-02-22: 30 mL via ORAL
  Filled 2015-02-22: qty 30

## 2015-02-22 NOTE — ED Notes (Signed)
Patient transported to X-ray 

## 2015-02-22 NOTE — ED Notes (Signed)
Patient states dull R chest pain x 1 month.   Patient states he has been seen by a doctor previously, but doesn't remember who.  Patient states "it's like an acid pain moving around in my chest".  Patient states also has headaches.

## 2015-02-22 NOTE — ED Provider Notes (Signed)
CSN: [id number]     Arrival date & time 02/22/15  0710 History   First MD Initiated Contact with Patient 02/22/15 0734     Chief Complaint  Patient presents with  . Chest Pain     (Consider location/radiation/quality/duration/timing/severity/associated sxs/prior Treatment) Patient is a 74 y.o. male presenting with chest pain. The history is provided by the patient and medical records.  Chest Pain  74 year old male with history of hypertension, GERD, arthritis, TIAs, presenting to the ED for chest pain. Patient states pain is localized to his midsternal region and radiates into the right side of his chest. Pain is described as a burning sensation which she states feels like his acid reflux. He denies any shortness of breath, diaphoresis, nausea, or vomiting. Patient does admit to eating spicy food recently including barbecue.  PCP put him on some OTC antacids but no relief with meds. He states he also has a cough with some white phlegm.  No fever, chills, sweats.  No recent sick contacts.  Patient does not have any known cardiac history. No significant family history. Patient is an occasional smoker. Vital signs stable.  Past Medical History  Diagnosis Date  . Arthritis   . Hypertension   . Rectal cancer   . Weakness   . HA (headache)   . TIA (transient ischemic attack)   . Chest pain   . GERD (gastroesophageal reflux disease)    Past Surgical History  Procedure Laterality Date  . Surgical excision of rectal cancer     Family History  Problem Relation Age of Onset  . Hypertension Mother   . Cancer Mother    Social History  Substance Use Topics  . Smoking status: Current Some Day Smoker -- 0.50 packs/day for 47 years    Types: Cigarettes  . Smokeless tobacco: Never Used  . Alcohol Use: 4.8 - 6.0 oz/week    8-10 Standard drinks or equivalent per week     Comment: hasn't had anything to drink in 2 weeks.    Review of Systems  Cardiovascular: Positive for chest pain.  All  other systems reviewed and are negative.     Allergies  Review of patient's allergies indicates no known allergies.  Home Medications   Prior to Admission medications   Medication Sig Start Date End Date Taking? Authorizing Provider  aspirin EC 81 MG tablet Take 81 mg by mouth daily.    Yes Historical Provider, MD  valsartan-hydrochlorothiazide (DIOVAN HCT) 160-25 MG per tablet Take 1 tablet by mouth daily. 11/06/14  Yes Tanda Rockers, MD   BP 122/78 mmHg  Pulse 67  Temp(Src) 97.6 F (36.4 C) (Oral)  Resp 18  Ht '5\' 11"'$  (1.803 m)  Wt 205 lb (92.987 kg)  BMI 28.60 kg/m2  SpO2 97%   Physical Exam  Constitutional: He is oriented to person, place, and time. He appears well-developed and well-nourished. No distress.  HENT:  Head: Normocephalic and atraumatic.  Mouth/Throat: Oropharynx is clear and moist.  Eyes: Conjunctivae and EOM are normal. Pupils are equal, round, and reactive to light.  Neck: Normal range of motion. Neck supple.  Cardiovascular: Normal rate, regular rhythm and normal heart sounds.   Pulmonary/Chest: Effort normal and breath sounds normal. No respiratory distress. He has no wheezes.  Abdominal: Soft. Bowel sounds are normal. There is no tenderness. There is no guarding.  Musculoskeletal: Normal range of motion. He exhibits no edema.  Neurological: He is alert and oriented to person, place, and time.  Skin: Skin  is warm and dry. He is not diaphoretic.  Psychiatric: He has a normal mood and affect.  Nursing note and vitals reviewed.   ED Course  Procedures (including critical care time) Labs Review Labs Reviewed  BASIC METABOLIC PANEL - Abnormal; Notable for the following:    Chloride 100 (*)    Glucose, Bld 142 (*)    All other components within normal limits  CBC  I-STAT TROPOININ, ED    Imaging Review Dg Chest 1 View  02/22/2015   CLINICAL DATA:  Evaluate for nipple shadows.  EXAM: CHEST  1 VIEW  COMPARISON:  02/22/2015.  01/25/2015 .  FINDINGS:  Repeat chest x-ray with nipple markers obtained. Previously identified nodular density in the right lung base represents nipple shadow. Stable cardiomegaly. Mild interstitial prominence again noted. Mild interstitial edema cannot be excluded. No pleural effusion or pneumothorax .  IMPRESSION: 1. Previously identified nodular density in the right lung base on chest x-ray of 02/22/2015 represents nipple shadow.  2. Persistent cardiomegaly with mild pulmonary interstitial prominence. Mild interstitial edema cannot be excluded.   Electronically Signed   By: Marcello Moores  Register   On: 02/22/2015 09:02   Dg Chest 2 View  02/22/2015   CLINICAL DATA:  74 year old male with 1 month history of chest burning and cough  EXAM: CHEST  2 VIEW  COMPARISON:  Prior chest x-ray 01/25/2015  FINDINGS: Borderline cardiomegaly. Mediastinal contours are within normal limits. Diffuse mild interstitial prominence is new compared 01/25/2015. No pleural effusion or pneumothorax. Similar pattern of central bronchitic changes. 9 mm nodular opacity projecting over the right lung base may represent a prominent nipple shadow. Otherwise, no focal airspace consolidation, nodule or mass. No acute osseous abnormality.  IMPRESSION: 1. New diffuse mild interstitial prominence may represent mild interstitial pulmonary edema or an atypical/viral infectious or diffuse inflammatory process. Interstitial edema is favored. 2. Borderline cardiomegaly. 3. Nonspecific 9 mm nodular opacity overlying the right lung base may represent a prominent nipple shadow or pulmonary nodule. Recommend repeat chest x-ray with nipple markers in place.   Electronically Signed   By: Jacqulynn Cadet M.D.   On: 02/22/2015 08:03   I have personally reviewed and evaluated these images and lab results as part of my medical decision-making.   EKG Interpretation   Date/Time:  Monday February 22 2015 07:15:12 EDT Ventricular Rate:  68 PR Interval:  186 QRS Duration: 92 QT  Interval:  406 QTC Calculation: 431 R Axis:   17 Text Interpretation:  Sinus rhythm with Premature atrial complexes  Otherwise normal ECG Sinus rhythm Premature atrial complexes Artifact  Abnormal ekg Confirmed by Carmin Muskrat  MD ([id number]) on 02/22/2015 7:45:38  AM      MDM   Final diagnoses:  Chest pain, unspecified chest pain type  Gastroesophageal reflux disease, esophagitis presence not specified   74 year old male here with chest pain. Patient cancer current pain to his acid reflux symptoms. He does admit to intake of spicy foods recently including barbecue. Patient is afebrile, nontoxic. EKG sinus rhythm without acute ischemic changes. Labwork is reassuring, troponin negative. Initial chest x-ray with questionable mass, however was repeated and this is in fact a nipple shadow.  No acute infiltrate noted, questionable edema, however BNP WNL and patient without hx of CHF.  VS remain stable.  Patient was treated in ED with GI cocktail and dose of pepcid with improvement of symptoms.  Patient has been seen in the ED multiple times for atypical chest pain with negative work-ups.  He  has no cardiac history.  I suspect his symptoms today are GERD related, lower suspicion for ACS, PE, dissection, or other acute cardiac event at this time. She will be discharged home with close PCP follow-up.  Discussed plan with patient, he/she acknowledged understanding and agreed with plan of care.  Return precautions given for new or worsening symptoms.  Case discussed with attending physician, Dr. Vanita Panda, who evaluated patient and agrees with assessment and plan of care.  Larene Pickett, PA-C 02/22/15 [place], MD 02/25/15 [phone]

## 2015-02-22 NOTE — ED Notes (Signed)
Called main lab to see if they had enough blood to run BNP. Shelia with lab will call back to verify

## 2015-02-22 NOTE — Discharge Instructions (Signed)
Take the prescribed medication as directed.  Recommend to decrease spicy/acidic food intake to help reduce reflux symptoms.  See handout for list of foods to avoid. Follow-up with your primary care physician. Return to the ED for new or worsening symptoms.

## 2015-02-25 ENCOUNTER — Ambulatory Visit (INDEPENDENT_AMBULATORY_CARE_PROVIDER_SITE_OTHER): Payer: Medicare Other | Admitting: Family Medicine

## 2015-02-25 VITALS — BP 120/80 | HR 71 | Temp 98.0°F | Resp 16 | Ht 70.5 in | Wt 203.0 lb

## 2015-02-25 DIAGNOSIS — R0789 Other chest pain: Secondary | ICD-10-CM

## 2015-02-25 DIAGNOSIS — K219 Gastro-esophageal reflux disease without esophagitis: Secondary | ICD-10-CM | POA: Diagnosis not present

## 2015-02-25 DIAGNOSIS — I10 Essential (primary) hypertension: Secondary | ICD-10-CM

## 2015-02-25 DIAGNOSIS — N529 Male erectile dysfunction, unspecified: Secondary | ICD-10-CM | POA: Diagnosis not present

## 2015-02-25 DIAGNOSIS — J449 Chronic obstructive pulmonary disease, unspecified: Secondary | ICD-10-CM

## 2015-02-25 NOTE — Patient Instructions (Addendum)
Take over-the-counter acetaminophen (Tylenol) 500 mg 2 pills at breakfast and 2 pills at supper for the next couple of weeks. If the chest pains are doing better, then you can decrease to just using on an as-needed basis. You do not need a prescription for this.  Continue your other medications  Take a daily walk  If abruptly worsen any time please return  Return in 3 or 4 months for a recheck  Get your flu shot somewhere soon.

## 2015-02-25 NOTE — Progress Notes (Signed)
Chest pains Subjective:  Patient ID: Andre [name], male    DOB: Nov 08, 1940  Age: 74 y.o. MRN: [id number]  74 year old plan previously known to me who was in the emergency room 3 days ago. He had taken a Cialis a friend and given him. He said about 36 hours later he was having chest pain in the substernal region up into the right upper chest. He has had that intermittently. It mostly hurts him in the daytime when he is sitting around in his chair watching television he is very inactive, doesn't do much except watch TV or go outbreak and occasional leaf. He does have a stationary bicycle that he rides on occasion. That does not seem to trigger chest pains. He does have a little shortness of breath. At nighttime he gets congested with coughing up white phlegm. He still smokes occasionally, but is not the last couple weeks. He saw the pulmonologist a few weeks ago who added and had a histamine at bedtime. He takes medicine for GERD. The emergency room testing came back normal. He saw a cardiologist about 7 or 8 months ago, and no significant problem came back on the testing. He is retired. Objective:   Pleasant gentleman in no major distress. Points to his right pectoralis areas where he has been having pain. His throat is clear. Neck supple without nodes or thyromegaly. No carotid bruits. Chest clear to auscultation. Heart regular without murmurs gallops or arrhythmias. No chest wall tenderness. Shoulder nontender. His abdomen is soft without masses or tenderness.   Assessment & Plan:   Assessment:  Atypical chest wall pain Right shoulder pain History of GERD History of mild COPD Erectile dysfunction  Plan:  Patient is already on a good regimen with anti histamine for the nighttime congestion, pantoprazole for the GERD, a daily aspirin, blood pressure pill. He knows not to take future erectile dysfunction medications. We'll keep him on a regular Tylenol regimen to try and calm down the chest wall  pains and see how that does.  Patient Instructions  Take over-the-counter acetaminophen (Tylenol) 500 mg 2 pills at breakfast and 2 pills at supper for the next couple of weeks. If the chest pains are doing better, then you can decrease to just using on an as-needed basis. You do not need a prescription for this.  Continue your other medications  Take a daily walk  If abruptly worsen any time please return   Patient declined flu shot here and said he will get from the drugstore. I told him he needs 1 the near future because of the fall flu season.  Letita Prentiss, MD 02/25/2015

## 2015-02-26 DIAGNOSIS — H2511 Age-related nuclear cataract, right eye: Secondary | ICD-10-CM | POA: Diagnosis not present

## 2015-02-26 DIAGNOSIS — H35341 Macular cyst, hole, or pseudohole, right eye: Secondary | ICD-10-CM | POA: Diagnosis not present

## 2015-02-26 DIAGNOSIS — H26492 Other secondary cataract, left eye: Secondary | ICD-10-CM | POA: Diagnosis not present

## 2015-02-26 DIAGNOSIS — H04123 Dry eye syndrome of bilateral lacrimal glands: Secondary | ICD-10-CM | POA: Diagnosis not present

## 2015-02-26 DIAGNOSIS — E119 Type 2 diabetes mellitus without complications: Secondary | ICD-10-CM | POA: Diagnosis not present

## 2015-02-26 DIAGNOSIS — Z961 Presence of intraocular lens: Secondary | ICD-10-CM | POA: Diagnosis not present

## 2015-02-26 DIAGNOSIS — H3531 Nonexudative age-related macular degeneration: Secondary | ICD-10-CM | POA: Diagnosis not present

## 2015-03-16 ENCOUNTER — Encounter (HOSPITAL_COMMUNITY): Payer: Self-pay | Admitting: Emergency Medicine

## 2015-03-16 ENCOUNTER — Emergency Department (HOSPITAL_COMMUNITY): Payer: Medicare Other

## 2015-03-16 ENCOUNTER — Emergency Department (HOSPITAL_COMMUNITY)
Admission: EM | Admit: 2015-03-16 | Discharge: 2015-03-16 | Disposition: A | Discharge status: Home / Self Care | Payer: Medicare Other | Attending: Emergency Medicine | Admitting: Emergency Medicine

## 2015-03-16 DIAGNOSIS — K219 Gastro-esophageal reflux disease without esophagitis: Secondary | ICD-10-CM | POA: Insufficient documentation

## 2015-03-16 DIAGNOSIS — M1990 Unspecified osteoarthritis, unspecified site: Secondary | ICD-10-CM | POA: Insufficient documentation

## 2015-03-16 DIAGNOSIS — R0602 Shortness of breath: Secondary | ICD-10-CM | POA: Diagnosis not present

## 2015-03-16 DIAGNOSIS — Z85048 Personal history of other malignant neoplasm of rectum, rectosigmoid junction, and anus: Secondary | ICD-10-CM | POA: Diagnosis not present

## 2015-03-16 DIAGNOSIS — Z8673 Personal history of transient ischemic attack (TIA), and cerebral infarction without residual deficits: Secondary | ICD-10-CM | POA: Diagnosis not present

## 2015-03-16 DIAGNOSIS — Z720 Tobacco use: Secondary | ICD-10-CM | POA: Insufficient documentation

## 2015-03-16 DIAGNOSIS — I10 Essential (primary) hypertension: Secondary | ICD-10-CM | POA: Diagnosis not present

## 2015-03-16 DIAGNOSIS — Z7982 Long term (current) use of aspirin: Secondary | ICD-10-CM | POA: Insufficient documentation

## 2015-03-16 DIAGNOSIS — Z79899 Other long term (current) drug therapy: Secondary | ICD-10-CM | POA: Diagnosis not present

## 2015-03-16 DIAGNOSIS — R05 Cough: Secondary | ICD-10-CM | POA: Diagnosis not present

## 2015-03-16 DIAGNOSIS — R0789 Other chest pain: Secondary | ICD-10-CM | POA: Diagnosis not present

## 2015-03-16 DIAGNOSIS — R079 Chest pain, unspecified: Secondary | ICD-10-CM | POA: Diagnosis present

## 2015-03-16 LAB — CBC WITH DIFFERENTIAL/PLATELET
BASOS ABS: 0 10*3/uL (ref 0.0–0.1)
BASOS PCT: 0 %
Eosinophils Absolute: 0.5 10*3/uL (ref 0.0–0.7)
Eosinophils Relative: 6 %
HEMATOCRIT: 45.6 % (ref 39.0–52.0)
HEMOGLOBIN: 14.4 g/dL (ref 13.0–17.0)
LYMPHS PCT: 57 %
Lymphs Abs: 4.4 10*3/uL — ABNORMAL HIGH (ref 0.7–4.0)
MCH: 30.4 pg (ref 26.0–34.0)
MCHC: 31.6 g/dL (ref 30.0–36.0)
MCV: 96.2 fL (ref 78.0–100.0)
MONOS PCT: 10 %
Monocytes Absolute: 0.8 10*3/uL (ref 0.1–1.0)
NEUTROS ABS: 2.1 10*3/uL (ref 1.7–7.7)
NEUTROS PCT: 27 %
Platelets: 225 10*3/uL (ref 150–400)
RBC: 4.74 MIL/uL (ref 4.22–5.81)
RDW: 15.6 % — ABNORMAL HIGH (ref 11.5–15.5)
WBC: 7.7 10*3/uL (ref 4.0–10.5)

## 2015-03-16 LAB — I-STAT TROPONIN, ED
TROPONIN I, POC: 0 ng/mL (ref 0.00–0.08)
Troponin i, poc: 0 ng/mL (ref 0.00–0.08)

## 2015-03-16 LAB — BASIC METABOLIC PANEL
ANION GAP: 9 (ref 5–15)
BUN: 8 mg/dL (ref 6–20)
CHLORIDE: 99 mmol/L — AB (ref 101–111)
CO2: 30 mmol/L (ref 22–32)
Calcium: 9.4 mg/dL (ref 8.9–10.3)
Creatinine, Ser: 0.97 mg/dL (ref 0.61–1.24)
GFR calc non Af Amer: >60 mL/min (ref >60)
Glucose, Bld: 120 mg/dL — ABNORMAL HIGH (ref 65–99)
Potassium: 4 mmol/L (ref 3.5–5.1)
Sodium: 138 mmol/L (ref 135–145)

## 2015-03-16 MED ORDER — ALUM & MAG HYDROXIDE-SIMETH 200-200-20 MG/5ML PO SUSP
30.0000 mL | Freq: Once | ORAL | Status: AC
Start: 2015-03-16 — End: 2015-03-16
  Administered 2015-03-16: 30 mL via ORAL
  Filled 2015-03-16: qty 30

## 2015-03-16 MED ORDER — RANITIDINE HCL 150 MG/10ML PO SYRP
300.0000 mg | ORAL_SOLUTION | Freq: Once | ORAL | Status: AC
  Administered 2015-03-16: 300 mg via ORAL
  Filled 2015-03-16: qty 20

## 2015-03-16 NOTE — ED Provider Notes (Signed)
CSN: [id number]     Arrival date & time 03/16/15  0406 History   First MD Initiated Contact with Patient 03/16/15 [phone]     Chief Complaint  Patient presents with  . Chest Pain     (Consider location/radiation/quality/duration/timing/severity/associated sxs/prior Treatment) HPI   Andre [name] is a 74yo male, PMH HTN, TIA, GERD, here with chest pain.  Described as burning, substernal and radiating down to his stomach.  He has SOB with it. Denies vomiting or diaphoresis.  He states this feels like his prior GERD.  No exertional component.  He did not take any medications for this.  This has been going on for weeks and is always worse at night when he lays down to go to sleep.  He has no further complaints.  10 Systems reviewed and are negative for acute change except as noted in the HPI.   Past Medical History  Diagnosis Date  . Arthritis   . Hypertension   . 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[place])   . Weakness   . HA (headache)   . TIA (transient ischemic attack)   . Chest pain   . GERD (gastroesophageal reflux disease)    Past Surgical History  Procedure Laterality Date  . Surgical excision of rectal cancer     Family History  Problem Relation Age of Onset  . Hypertension Mother   . Cancer Mother    Social History  Substance Use Topics  . Smoking status: Current Some Day Smoker -- 0.50 packs/day for 47 years    Types: Cigarettes  . Smokeless tobacco: Never Used  . Alcohol Use: 4.8 - 6.0 oz/week    8-10 Standard drinks or equivalent per week     Comment: hasn't had anything to drink in 2 weeks.    Review of Systems    Allergies  Review of patient's allergies indicates no known allergies.  Home Medications   Prior to Admission medications   Medication Sig Start Date End Date Taking? Authorizing Provider  aspirin EC 81 MG tablet Take 81 mg by mouth daily.     Historical Provider, MD  famotidine (PEPCID) 20 MG tablet Take 1 tablet (20 mg total) by mouth 2 (two) times daily. 02/22/15    Larene Pickett, PA-C  pantoprazole (PROTONIX) 40 MG tablet Take 40 mg by mouth daily.    Historical Provider, MD  valsartan-hydrochlorothiazide (DIOVAN HCT) 160-25 MG per tablet Take 1 tablet by mouth daily. 11/06/14   Tanda Rockers, MD   BP 131/78 mmHg  Pulse 62  Temp(Src) 97.6 F (36.4 C) (Oral)  Resp 15  Ht '5\' 11"'$  (1.803 m)  Wt 208 lb (94.348 kg)  BMI 29.02 kg/m2  SpO2 98% Physical Exam  Constitutional: He is oriented to person, place, and time. Vital signs are normal. He appears well-developed and well-nourished.  Non-toxic appearance. He does not appear ill. No distress.  HENT:  Head: Normocephalic and atraumatic.  Nose: Nose normal.  Mouth/Throat: Oropharynx is clear and moist. No oropharyngeal exudate.  Eyes: Conjunctivae and EOM are normal. Pupils are equal, round, and reactive to light. No scleral icterus.  Neck: Normal range of motion. Neck supple. No tracheal deviation, no edema, no erythema and normal range of motion present. No thyroid mass and no thyromegaly present.  Cardiovascular: Normal rate, regular rhythm, S1 normal, S2 normal, normal heart sounds, intact distal pulses and normal pulses.  Exam reveals no gallop and no friction rub.   No murmur heard. Pulses:      Radial  pulses are 2+ on the right side, and 2+ on the left side.       Dorsalis pedis pulses are 2+ on the right side, and 2+ on the left side.  Pulmonary/Chest: Effort normal and breath sounds normal. No respiratory distress. He has no wheezes. He has no rhonchi. He has no rales.  Abdominal: Soft. Normal appearance and bowel sounds are normal. He exhibits no distension, no ascites and no mass. There is no hepatosplenomegaly. There is no tenderness. There is no rebound, no guarding and no CVA tenderness.  Musculoskeletal: Normal range of motion. He exhibits no edema or tenderness.  Lymphadenopathy:    He has no cervical adenopathy.  Neurological: He is alert and oriented to person, place, and time. He has  normal strength. No cranial nerve deficit or sensory deficit.  Skin: Skin is warm, dry and intact. No petechiae and no rash noted. He is not diaphoretic. No erythema. No pallor.  Psychiatric: He has a normal mood and affect. His behavior is normal. Judgment normal.  Nursing note and vitals reviewed.   ED Course  Procedures (including critical care time) Labs Review Labs Reviewed  CBC WITH DIFFERENTIAL/PLATELET - Abnormal; Notable for the following:    RDW 15.6 (*)    Lymphs Abs 4.4 (*)    All other components within normal limits  BASIC METABOLIC PANEL - Abnormal; Notable for the following:    Chloride 99 (*)    Glucose, Bld 120 (*)    All other components within normal limits  I-STAT TROPOININ, ED    Imaging Review Dg Chest 2 View  03/16/2015   CLINICAL DATA:  Acute onset of centralized chest pain, cough and congestion. Initial encounter.  EXAM: CHEST  2 VIEW  COMPARISON:  Chest radiograph performed 02/22/2015  FINDINGS: The lungs are well-aerated. Pulmonary vascularity is at the upper limits of normal. There is no evidence of focal opacification, pleural effusion or pneumothorax.  The heart is borderline enlarged. No acute osseous abnormalities are seen.  IMPRESSION: Borderline cardiomegaly.  No acute cardiopulmonary process seen.   Electronically Signed   By: Garald Balding M.D.   On: 03/16/2015 05:34   I have personally reviewed and evaluated these images and lab results as part of my medical decision-making.   EKG Interpretation   Date/Time:  Tuesday March 16 2015 04:13:34 EDT Ventricular Rate:  63 PR Interval:  202 QRS Duration: 97 QT Interval:  421 QTC Calculation: 431 R Axis:   37 Text Interpretation:  Sinus rhythm Nonspecific T abnrm, anterolateral  leads No significant change since last tracing Confirmed by Glynn Octave [id number]) on 03/16/2015 5:34:56 AM      MDM   Final diagnoses:  None   Patient presents to the ED for chest pain. He has some risk  factors for ACS, however he has a history of GERD which is mor likely.  Will evaluate him for low risk ACS with 2 troponins and EKGs in the ED.  He was also given ranitidine and zantac and maalox for pain relief.  HEART score is less than 3.  Initial trop is negative and EKG is not changed.    Repeat is pending at 7:40am.  Will sign out to oncoming provider.  If troponin and EKG negative, patient is safe for DC home with instructions prepared by myself.     Everlene Balls, MD 03/16/15 0600

## 2015-03-16 NOTE — ED Notes (Signed)
PT ambulated with baseline gait; VSS; A&Ox3; no signs of distress; respirations even and unlabored; skin warm and dry; no questions upon discharge.  

## 2015-03-16 NOTE — ED Notes (Signed)
Pt reports that his chest has "started burning" indicating the pain is coming from the central chest area.  He reports that he has had "lots" of white mucus and a headache.

## 2015-03-16 NOTE — Discharge Instructions (Signed)
Chest Pain (Nonspecific) Andre [name], you were seen today for chest pain.  Your blood work and EKG were normal for you.  Take zantac as needed for your reflux and see a primary care doctor within 3 days for close follow up.  If symptoms worsen, come back to the ED immediately.  Thank you. It is often hard to give a diagnosis for the cause of chest pain. There is always a chance that your pain could be related to something serious, such as a heart attack or a blood clot in the lungs. You need to follow up with your doctor. HOME CARE  If antibiotic medicine was given, take it as directed by your doctor. Finish the medicine even if you start to feel better.  For the next few days, avoid activities that bring on chest pain. Continue physical activities as told by your doctor.  Do not use any tobacco products. This includes cigarettes, chewing tobacco, and e-cigarettes.  Avoid drinking alcohol.  Only take medicine as told by your doctor.  Follow your doctor's suggestions for more testing if your chest pain does not go away.  Keep all doctor visits you made. GET HELP IF:  Your chest pain does not go away, even after treatment.  You have a rash with blisters on your chest.  You have a fever. GET HELP RIGHT AWAY IF:   You have more pain or pain that spreads to your arm, neck, jaw, back, or belly (abdomen).  You have shortness of breath.  You cough more than usual or cough up blood.  You have very bad back or belly pain.  You feel sick to your stomach (nauseous) or throw up (vomit).  You have very bad weakness.  You pass out (faint).  You have chills. This is an emergency. Do not wait to see if the problems will go away. Call your local emergency services (911 in [place] Do not drive yourself to the hospital. MAKE SURE YOU:   Understand these instructions.  Will watch your condition.  Will get help right away if you are not doing well or get worse. Document Released: 11/15/2007  Document Revised: 06/03/2013 Document Reviewed: 11/15/2007 [facility] Patient Information 2015 [place], [place]. This information is not intended to replace advice given to you by your health care provider. Make sure you discuss any questions you have with your health care provider.

## 2015-03-17 ENCOUNTER — Emergency Department (HOSPITAL_COMMUNITY): Payer: Medicare Other

## 2015-03-17 ENCOUNTER — Emergency Department (HOSPITAL_COMMUNITY)
Admission: EM | Admit: 2015-03-17 | Discharge: 2015-03-17 | Discharge status: Against Medical Advice | Payer: Medicare Other | Attending: Emergency Medicine | Admitting: Emergency Medicine

## 2015-03-17 ENCOUNTER — Encounter (HOSPITAL_COMMUNITY): Payer: Self-pay | Admitting: Emergency Medicine

## 2015-03-17 DIAGNOSIS — Z8673 Personal history of transient ischemic attack (TIA), and cerebral infarction without residual deficits: Secondary | ICD-10-CM | POA: Diagnosis not present

## 2015-03-17 DIAGNOSIS — Z79899 Other long term (current) drug therapy: Secondary | ICD-10-CM | POA: Insufficient documentation

## 2015-03-17 DIAGNOSIS — R0789 Other chest pain: Secondary | ICD-10-CM | POA: Insufficient documentation

## 2015-03-17 DIAGNOSIS — Z7982 Long term (current) use of aspirin: Secondary | ICD-10-CM | POA: Insufficient documentation

## 2015-03-17 DIAGNOSIS — M1990 Unspecified osteoarthritis, unspecified site: Secondary | ICD-10-CM | POA: Insufficient documentation

## 2015-03-17 DIAGNOSIS — I10 Essential (primary) hypertension: Secondary | ICD-10-CM | POA: Diagnosis not present

## 2015-03-17 DIAGNOSIS — Z8719 Personal history of other diseases of the digestive system: Secondary | ICD-10-CM | POA: Insufficient documentation

## 2015-03-17 DIAGNOSIS — Z85048 Personal history of other malignant neoplasm of rectum, rectosigmoid junction, and anus: Secondary | ICD-10-CM | POA: Diagnosis not present

## 2015-03-17 DIAGNOSIS — R0602 Shortness of breath: Secondary | ICD-10-CM | POA: Diagnosis not present

## 2015-03-17 DIAGNOSIS — R05 Cough: Secondary | ICD-10-CM | POA: Diagnosis not present

## 2015-03-17 DIAGNOSIS — Z720 Tobacco use: Secondary | ICD-10-CM | POA: Diagnosis not present

## 2015-03-17 DIAGNOSIS — R079 Chest pain, unspecified: Secondary | ICD-10-CM | POA: Diagnosis present

## 2015-03-17 LAB — BASIC METABOLIC PANEL
ANION GAP: 9 (ref 5–15)
BUN: 7 mg/dL (ref 6–20)
CALCIUM: 9 mg/dL (ref 8.9–10.3)
CO2: 25 mmol/L (ref 22–32)
Chloride: 101 mmol/L (ref 101–111)
Creatinine, Ser: 0.79 mg/dL (ref 0.61–1.24)
GLUCOSE: 123 mg/dL — AB (ref 65–99)
Potassium: 3.9 mmol/L (ref 3.5–5.1)
Sodium: 135 mmol/L (ref 135–145)

## 2015-03-17 LAB — CBC
HCT: 44.6 % (ref 39.0–52.0)
HEMOGLOBIN: 14.8 g/dL (ref 13.0–17.0)
MCH: 31.4 pg (ref 26.0–34.0)
MCHC: 33.2 g/dL (ref 30.0–36.0)
MCV: 94.7 fL (ref 78.0–100.0)
PLATELETS: 210 10*3/uL (ref 150–400)
RBC: 4.71 MIL/uL (ref 4.22–5.81)
RDW: 15.3 % (ref 11.5–15.5)
WBC: 5.9 10*3/uL (ref 4.0–10.5)

## 2015-03-17 LAB — I-STAT TROPONIN, ED: TROPONIN I, POC: 0 ng/mL (ref 0.00–0.08)

## 2015-03-17 NOTE — ED Notes (Signed)
Pt. reports mid chest pain with SOB , productive cough and chest congestion onset yesterday , seen here yesterday for the same complaint , blood tests done , chest x-ray done and was discharged home .

## 2015-03-17 NOTE — ED Provider Notes (Signed)
CSN: [id number]     Arrival date & time 03/17/15  0244 History  By signing my name below, I, [name], attest that this documentation has been prepared under the direction and in the presence of [name], MD. Electronically Signed: [name], ED Scribe. 03/17/2015. 3:58 AM.  Chief Complaint  Patient presents with  . Chest Pain   The history is provided by the patient. No language interpreter was used.     HPI Comments: Andre [name] is a 74 y.o. male with hx HTN, TIA, and GERD who presents to the Emergency Department complaining of gradual onset, intermittent, "crawling," right sided chest pain x 2 weeks, gradually worsening. The pain lasts anywhere between 10-20 minutes. Pt believes that the pain is associated with eating because the pain comes on shortly after eating. Pt also complains of productive cough with white phlegm x 2-3 days. Pt was seen in ED 1 day ago for same symptoms. Pt had cardiac workup done in the ED with no acute findings. He states that he was pain free when he went to bed tonight but woke up with pain again, prompting him to come to the ED. He does admit to eating some candy prior to going to bed. Denies nausea, vomiting, fever, chills, or any other associated symptoms. Pt's last stress test was in March 2015. Pt is current every day smoker. No hx MIs.    Past Medical History  Diagnosis Date  . Arthritis   . Hypertension   . 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[place])   . Weakness   . HA (headache)   . TIA (transient ischemic attack)   . Chest pain   . GERD (gastroesophageal reflux disease)    Past Surgical History  Procedure Laterality Date  . Surgical excision of rectal cancer     Family History  Problem Relation Age of Onset  . Hypertension Mother   . Cancer Mother    Social History  Substance Use Topics  . Smoking status: Current Some Day Smoker -- 0.00 packs/day for 47 years    Types: Cigarettes  . Smokeless tobacco: Never Used  . Alcohol Use: Yes    Review  of Systems  Constitutional: Negative for fever and chills.  Respiratory: Positive for cough.   Cardiovascular: Positive for chest pain.  Gastrointestinal: Negative for nausea and vomiting.    ROS 10 Systems reviewed and are negative for acute change except as noted in the HPI.     Allergies  Review of patient's allergies indicates no known allergies.  Home Medications   Prior to Admission medications   Medication Sig Start Date End Date Taking? Authorizing Provider  aspirin EC 81 MG tablet Take 81 mg by mouth daily.    Yes Historical Provider, MD  valsartan-hydrochlorothiazide (DIOVAN HCT) 160-25 MG per tablet Take 1 tablet by mouth daily. 11/06/14  Yes Tanda Rockers, MD   Triage Vitals: BP 130/68 mmHg  Pulse 58  Temp(Src) 98.6 F (37 C) (Oral)  Resp 15  SpO2 99%   Physical Exam  Constitutional: He is oriented to person, place, and time. He appears well-developed and well-nourished. No distress.  HENT:  Head: Normocephalic and atraumatic.  Eyes: Conjunctivae and EOM are normal.  Neck: Neck supple. No JVD present. No tracheal deviation present.  Cardiovascular: Normal rate, regular rhythm and normal heart sounds.   Pulmonary/Chest: Effort normal and breath sounds normal. No respiratory distress. He has no wheezes. He has no rhonchi. He has no rales.  Lungs are clear to  auscultation bilaterally 2+ and equal raidal pulse bilaterally  Musculoskeletal: Normal range of motion. He exhibits no edema.  No pitting edema No unilateral leg swelling  Neurological: He is alert and oriented to person, place, and time.  Skin: Skin is warm and dry.  Psychiatric: He has a normal mood and affect. His behavior is normal.  Nursing note and vitals reviewed.   ED Course  Procedures (including critical care time)  DIAGNOSTIC STUDIES: Oxygen Saturation is 99% on RA, normal by my interpretation.    COORDINATION OF CARE: 3:56 AM-Discussed treatment plan which includes continuation with  GERD medication with pt at bedside and pt agreed to plan.   Labs Review Labs Reviewed  BASIC METABOLIC PANEL - Abnormal; Notable for the following:    Glucose, Bld 123 (*)    All other components within normal limits  CBC  I-STAT TROPOININ, ED  I-STAT TROPOININ, ED    Imaging Review Dg Chest 2 View  03/17/2015   CLINICAL DATA:  Acute onset of shortness of breath, cough, congestion and upper mid chest pain. Initial encounter.  EXAM: CHEST  2 VIEW  COMPARISON:  Chest radiograph performed 03/16/2015  FINDINGS: The lungs are well-aerated. Mild peribronchial thickening is noted. There is no evidence of focal opacification, pleural effusion or pneumothorax.  The heart is mildly enlarged. No acute osseous abnormalities are seen.  IMPRESSION: Mild peribronchial thickening noted.  Mild cardiomegaly.   Electronically Signed   By: Garald Balding M.D.   On: 03/17/2015 03:13   Dg Chest 2 View  03/16/2015   CLINICAL DATA:  Acute onset of centralized chest pain, cough and congestion. Initial encounter.  EXAM: CHEST  2 VIEW  COMPARISON:  Chest radiograph performed 02/22/2015  FINDINGS: The lungs are well-aerated. Pulmonary vascularity is at the upper limits of normal. There is no evidence of focal opacification, pleural effusion or pneumothorax.  The heart is borderline enlarged. No acute osseous abnormalities are seen.  IMPRESSION: Borderline cardiomegaly.  No acute cardiopulmonary process seen.   Electronically Signed   By: Garald Balding M.D.   On: 03/16/2015 05:34   I have personally reviewed and evaluated these images and lab results as part of my medical decision-making.   EKG Interpretation   Date/Time:  Wednesday March 17 2015 02:51:12 EDT Ventricular Rate:  63 PR Interval:  198 QRS Duration: 90 QT Interval:  422 QTC Calculation: 431 R Axis:   34 Text Interpretation:  Normal sinus rhythm with sinus arrhythmia  Nonspecific ST and T wave abnormality Abnormal ECG has not changed  Confirmed by  Lesha Jager, MD, Jaman Aro ([id number]) on 03/17/2015 3:52:11 AM      MDM   Final diagnoses:  Atypical chest pain    Pt comes in with atypical chest pain. He has some R sided pain that is moving towards his throat. The pain is worse after food intake. He has no known CAD hx. He has been having this pain for 2 weeks. Pain is post prandial and has atypical location and characteristics. Will get trops x 2.  7:45 AM Pt wanted to leave AMA. Patient wants to leave against medical advice. Patient understands that his/her actions will lead to inadequate medical workup, and that he/she is at risk of complications of missed diagnosis, which includes morbidity and mortality.  Patient is demonstrating good capacity to make decision. Patient understands that he/she needs to return to the ER immediately if his/her symptoms get worse.  He wants to leave as RN was not responding to the  call button. I asked him to stay, but when i told him the plan - he felt he rather leave. Advised pcp f/u in a week and continued antacids. I think he might need to GI.   [name], MD 03/17/15 [phone]

## 2015-03-17 NOTE — ED Notes (Signed)
Attempted to collect blood, unsuccessful.

## 2015-03-19 ENCOUNTER — Ambulatory Visit (INDEPENDENT_AMBULATORY_CARE_PROVIDER_SITE_OTHER): Payer: Medicare Other | Admitting: Adult Health

## 2015-03-19 ENCOUNTER — Encounter: Payer: Self-pay | Admitting: Adult Health

## 2015-03-19 VITALS — BP 116/72 | HR 67 | Temp 98.6°F | Ht 71.0 in | Wt 205.0 lb

## 2015-03-19 DIAGNOSIS — Z720 Tobacco use: Secondary | ICD-10-CM

## 2015-03-19 DIAGNOSIS — J449 Chronic obstructive pulmonary disease, unspecified: Secondary | ICD-10-CM | POA: Diagnosis not present

## 2015-03-19 DIAGNOSIS — F17210 Nicotine dependence, cigarettes, uncomplicated: Secondary | ICD-10-CM

## 2015-03-19 DIAGNOSIS — R05 Cough: Secondary | ICD-10-CM

## 2015-03-19 DIAGNOSIS — R058 Other specified cough: Secondary | ICD-10-CM

## 2015-03-19 MED ORDER — LEVALBUTEROL HCL 0.63 MG/3ML IN NEBU
0.6300 mg | INHALATION_SOLUTION | Freq: Once | RESPIRATORY_TRACT | Status: AC
  Administered 2015-03-19: 0.63 mg via RESPIRATORY_TRACT

## 2015-03-19 MED ORDER — PANTOPRAZOLE SODIUM 40 MG PO TBEC: 40.0000 mg | DELAYED_RELEASE_TABLET | Freq: Every day | ORAL | Status: DC

## 2015-03-19 MED ORDER — PREDNISONE 10 MG PO TABS: ORAL_TABLET | ORAL | Status: DC

## 2015-03-19 NOTE — Assessment & Plan Note (Signed)
?   Flare with URI , control for GERD triggers.  Plan   Restart Protonix '40mg'$ .daily before meal .  Restart Pepcid '20mg'$  At bedtime  -this is over the counter.  Mucinex DM Twice daily As needed  Cough/congestion  Prednisone taper over next week.  Follow up Dr. [name]  In 4 weeks and As needed   Warm heat to chest wall .  Tylenol '325mg'$  1 tab every 8hr as needed for pain.  Please contact office for sooner follow up if symptoms do not improve or worsen or seek emergency care

## 2015-03-19 NOTE — Progress Notes (Signed)
Chart and office note reviewed in detail  > agree with a/p as outlined    

## 2015-03-19 NOTE — Progress Notes (Signed)
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 Advice Only    from Dr. Johnsie Cancel; CP that goes from RT to LT side x5 months; no cough spits up white mucus mainly at night; SOB w/activity.  every night when lies down has immediate choking> coughing x months Walking at [facility] ok, can't rake leaves  X 5 min or do [employer] s giving out due to sob  Confused with all the meds he's taking for gerd "they don't work" Cp is paroxysmal, more common when lies down p a big meal, some better on gerd rx, migrates across ant chest R to L. No pleuritic component.  rec Stop lisinopril Start avapro (valsartan) 160 mg daily  GERD diet Add pepcid ac 20 mg one at bedtime Call me with the name of the pill you are taking and I will tell you what to do with it > confirmed lisinopril  Please schedule a follow up office visit in 6 weeks, call sooner if needed with all active medicines with you     09/01/2014 f/u ov/[name] re: cough gone, now cc sob  Chief Complaint  Patient presents with  . Acute Visit    Increased SOB and HA x 2 weeks.   cough is completely gone and lies flat ok/ no noct symptoms  Sob only with exertion like raking leaves, last did this ok 6 m ago [name] tends to be in afternoon, never am's or waking him p hs  Not using gerd rx as rec rec Pantoprazole (protonix) 40 mg   Take 30-60 min before first meal of the day and Pepcid 20 mg one bedtime until return to office - this is the best way to tell whether stomach acid is contributing to your problem. GERD   Cut down on coffee and try  midrin one every 4 hours as needed for headache  Please remember to go to the lab  department downstairs for your tests - we will call you with the results when they are available. Please schedule a follow up office visit in 4 weeks, sooner if needed    Add did not go to lab     11/04/14 to ER with CP/ r/o MI    11/06/2014 acute  ov/[name] re: new noct pattern nightly / cp and sob x since last ov never took gerd rx  Chief Complaint  Patient presents with  . Follow-up    [name] increased SOB, HA and cough since his last visit. He also c/o CP- wakes up him early every am and then he can not get back to sleep.   59 yards doe every time he tries, just about every night cough/ choking around 2 am / very easily confused with meds/ not sure really what he's taking Cp component is minimal and "like a stinging sensation" mid sternum just as common at rest as with exertion  rec Nortriptylline Take x 2 at bedtime Change the valsartan to valsartan-hct 160/25 Pantoprazole (protonix) 40 mg   Take  30-60  min before first meal of the day and Pepcid (famotidine)  20 mg one @  bedtime   GERD diet    02/09/2015 f/u ov/[name] re: unexplaind doe / noct sob  Chief Complaint  Patient presents with  . Follow-up    [name] CP and HA on and off x 3 wks. He states not coughing much, but bringing up alot of white sputum.  He describes CP as dull and it is worse after he eats.    improved p last ov but continued to have sleep disruption every night never at hs or when wakes up at 7 am at sual hour, much    worse x 3 weeks Still smoking/ doe x 150 ft. Says brought meds but has more at home - does not have ppi or nortriptylline nor pepcid with him - very easily confused with details of medical care >>  03/19/2015 Acute OV :  Pt presents for an acute office visit  Complains of chest tightness, prod cough with white mucus, and increased SOB x 2 weeks. Denies any sinus pressure/drainage, fever, nausea or vomitting. No  discolored mucus or fever.  Went to ER x 2 this week for chest pain. Cardiac enzymes were neg x 3 . CXR w/  No acute process x 2  CBC and BMET ok.  Previous cardiac workup showed normal card cath in 2013 and reported  myoview in 2015 that normal.  Had not smoked for 3 weeks.  Migratory chest pain and soreness in anterior chest wall. No rash or known trauma.  Drinks etoh daily .  Denies drug use.      Current Medications, Allergies, Complete Past Medical History, Past Surgical History, Family History, and Social History were reviewed in [employer] record.  ROS  The following are not active complaints unless bolded sore throat, dysphagia, dental problems, itching, sneezing,  nasal congestion or excess/ purulent secretions, ear ache,   fever, chills, sweats, unintended wt loss, classically hemoptysis,  orthopnea pnd or leg swelling, presyncope, palpitations, heartburn, abdominal pain, anorexia, nausea, vomiting, diarrhea  or change in bowel or urinary habits, change in stools or urine, dysuria,hematuria,  rash, arthralgias, visual complaints, headache, numbness weakness or ataxia or problems with walking or coordination,  change in mood/affect or memory.             Objective:   Physical Exam  amb minimally hoarse bm nad   11/06/14           211 >    02/09/2015   206> 206 03/19/2015    Vital signs reviewed  HEENT: nl dentition, turbinates, and orophanx. Nl external ear canals without cough reflex   NECK :  without JVD/Nodes/TM/ nl carotid upstrokes bilaterally   LUNGS: no acc muscle use, clear to A and P bilaterally without cough on insp or exp maneuvers Tender along ant cw , no deformity or rash noted    CV:  RRR  no s3 or murmur or increase in P2, no edema   ABD:  soft and nontender with nl excursion in the supine position. No bruits or organomegaly, bowel sounds nl  MS:  warm without deformities, calf tenderness, cyanosis or clubbing  SKIN: warm and  dry without lesions    NEURO:  alert, approp, no deficits      ER cxr 10/4 and 10/5 w/ nad   CXR:  11/04/14  No active cardiopulmonary disease. Mild stable cardiomegaly.    Labs  reviewed:   Lab  11/04/14 0710 11/06/14 1520  NA 137 137  K 3.5 3.7  CL 106 103  CO2 24 28  BUN <5* 7  CREATININE 0.68 0.79  GLUCOSE 142* 108*     Lab 11/04/14 0710 11/06/14 1520  HGB 15.1 15.6  HCT 46.5 46.9  WBC 5.6 8.6  PLT 207 271.0     Lab Results  Component Value Date   TSH 1.03 11/06/2014     Lab Results  Component Value Date   PROBNP 23.0 11/06/2014     Lab Results  Component Value Date   ESRSEDRATE 4 11/06/2014   POCTSEDRATE 3 08/28/2013         Assessment:

## 2015-03-19 NOTE — Patient Instructions (Addendum)
Restart Protonix '40mg'$ .daily before meal .  Restart Pepcid '20mg'$  At bedtime  -this is over the counter.  Mucinex DM Twice daily As needed  Cough/congestion  Prednisone taper over next week.  Follow up Dr. [name]  In 4 weeks and As needed   Warm heat to chest wall .  Tylenol '325mg'$  1 tab every 8hr as needed for pain.  Please contact office for sooner follow up if symptoms do not improve or worsen or seek emergency care

## 2015-03-19 NOTE — Assessment & Plan Note (Signed)
Restart Protonix '40mg'$ .daily before meal .  Restart Pepcid '20mg'$  At bedtime  -this is over the counter.  Mucinex DM Twice daily As needed  Cough/congestion  Prednisone taper over next week.  Follow up Dr. [name]  In 4 weeks and As needed   Warm heat to chest wall .  Tylenol '325mg'$  1 tab every 8hr as needed for pain.  Please contact office for sooner follow up if symptoms do not improve or worsen or seek emergency care

## 2015-03-19 NOTE — Addendum Note (Signed)
Addended by: Osa Craver on: 03/19/2015 10:53 AM   Modules accepted: Orders

## 2015-03-19 NOTE — Assessment & Plan Note (Signed)
Smoking cessation  

## 2015-04-02 ENCOUNTER — Encounter (HOSPITAL_COMMUNITY): Payer: Self-pay | Admitting: Emergency Medicine

## 2015-04-02 ENCOUNTER — Emergency Department (HOSPITAL_COMMUNITY): Payer: Medicare Other

## 2015-04-02 ENCOUNTER — Emergency Department (HOSPITAL_COMMUNITY)
Admission: EM | Admit: 2015-04-02 | Discharge: 2015-04-02 | Disposition: A | Discharge status: Home / Self Care | Payer: Medicare Other | Attending: Emergency Medicine | Admitting: Emergency Medicine

## 2015-04-02 DIAGNOSIS — K219 Gastro-esophageal reflux disease without esophagitis: Secondary | ICD-10-CM | POA: Insufficient documentation

## 2015-04-02 DIAGNOSIS — R072 Precordial pain: Secondary | ICD-10-CM | POA: Diagnosis not present

## 2015-04-02 DIAGNOSIS — R0602 Shortness of breath: Secondary | ICD-10-CM | POA: Diagnosis not present

## 2015-04-02 DIAGNOSIS — Z85048 Personal history of other malignant neoplasm of rectum, rectosigmoid junction, and anus: Secondary | ICD-10-CM | POA: Insufficient documentation

## 2015-04-02 DIAGNOSIS — R079 Chest pain, unspecified: Secondary | ICD-10-CM | POA: Diagnosis not present

## 2015-04-02 DIAGNOSIS — Z8673 Personal history of transient ischemic attack (TIA), and cerebral infarction without residual deficits: Secondary | ICD-10-CM | POA: Insufficient documentation

## 2015-04-02 DIAGNOSIS — I10 Essential (primary) hypertension: Secondary | ICD-10-CM | POA: Insufficient documentation

## 2015-04-02 DIAGNOSIS — Z87891 Personal history of nicotine dependence: Secondary | ICD-10-CM | POA: Insufficient documentation

## 2015-04-02 DIAGNOSIS — Z7982 Long term (current) use of aspirin: Secondary | ICD-10-CM | POA: Diagnosis not present

## 2015-04-02 DIAGNOSIS — M1990 Unspecified osteoarthritis, unspecified site: Secondary | ICD-10-CM | POA: Insufficient documentation

## 2015-04-02 LAB — BASIC METABOLIC PANEL
ANION GAP: 10 (ref 5–15)
BUN: <5 mg/dL — ABNORMAL LOW (ref 6–20)
CALCIUM: 9.5 mg/dL (ref 8.9–10.3)
CO2: 26 mmol/L (ref 22–32)
CREATININE: 0.82 mg/dL (ref 0.61–1.24)
Chloride: 103 mmol/L (ref 101–111)
Glucose, Bld: 129 mg/dL — ABNORMAL HIGH (ref 65–99)
Potassium: 4.3 mmol/L (ref 3.5–5.1)
SODIUM: 139 mmol/L (ref 135–145)

## 2015-04-02 LAB — I-STAT TROPONIN, ED: Troponin i, poc: 0 ng/mL (ref 0.00–0.08)

## 2015-04-02 LAB — CBC WITH DIFFERENTIAL/PLATELET
BASOS ABS: 0 10*3/uL (ref 0.0–0.1)
BASOS PCT: 0 %
EOS ABS: 0.3 10*3/uL (ref 0.0–0.7)
EOS PCT: 4 %
HCT: 45.7 % (ref 39.0–52.0)
Hemoglobin: 15.2 g/dL (ref 13.0–17.0)
Lymphocytes Relative: 52 %
Lymphs Abs: 3.8 10*3/uL (ref 0.7–4.0)
MCH: 31.7 pg (ref 26.0–34.0)
MCHC: 33.3 g/dL (ref 30.0–36.0)
MCV: 95.2 fL (ref 78.0–100.0)
MONO ABS: 0.8 10*3/uL (ref 0.1–1.0)
Monocytes Relative: 11 %
NEUTROS ABS: 2.4 10*3/uL (ref 1.7–7.7)
Neutrophils Relative %: 33 %
PLATELETS: 183 10*3/uL (ref 150–400)
RBC: 4.8 MIL/uL (ref 4.22–5.81)
RDW: 15.4 % (ref 11.5–15.5)
WBC: 7.3 10*3/uL (ref 4.0–10.5)

## 2015-04-02 LAB — TROPONIN I

## 2015-04-02 MED ORDER — ALUM & MAG HYDROXIDE-SIMETH 200-200-20 MG/5ML PO SUSP
30.0000 mL | Freq: Once | ORAL | Status: AC
  Administered 2015-04-02: 30 mL via ORAL
  Filled 2015-04-02: qty 30

## 2015-04-02 MED ORDER — RANITIDINE HCL 150 MG/10ML PO SYRP
300.0000 mg | ORAL_SOLUTION | Freq: Once | ORAL | Status: AC
  Administered 2015-04-02: 300 mg via ORAL
  Filled 2015-04-02: qty 20

## 2015-04-02 NOTE — ED Notes (Signed)
Patient c/o central CP with no radiation that woke him up around 0200 this morning, accompanied with "a little SOB." Patient states it is recurrent and happened about 2 weeks ago, was seen and treated here.  Patient A&O x 4, respirations e/u.

## 2015-04-02 NOTE — ED Notes (Signed)
Pt verbalized understanding of d/c instructions and follow-up care. No further questions/concerns. Pt ambulatory to lobby - refused wheelchair.

## 2015-04-02 NOTE — ED Provider Notes (Signed)
CSN: [id number]     Arrival date & time 04/02/15  0418 History   First MD Initiated Contact with Patient 04/02/15 0544     Chief Complaint  Patient presents with  . Chest Pain     (Consider location/radiation/quality/duration/timing/severity/associated sxs/prior Treatment) HPI   Mr. Tracz is a 74yo male, PMH GERD, TIA, presenting tonight with CP. He states the pain is consistent with GERD.  It is substernal and burning with radiation to his throat.  He has nausea, SOB.  No diaphoresis.  He wants to be sure this is not his heart.  He is taking home GERD medications.  There is no exertional factors.  He denies fever or cough.  He otherwise has no other complaints.  10 Systems reviewed and are negative for acute change except as noted in the HPI.    Past Medical History  Diagnosis Date  . Arthritis   . Hypertension   . 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[place])   . Weakness   . HA (headache)   . TIA (transient ischemic attack)   . Chest pain   . GERD (gastroesophageal reflux disease)    Past Surgical History  Procedure Laterality Date  . Surgical excision of rectal cancer     Family History  Problem Relation Age of Onset  . Hypertension Mother   . Cancer Mother    Social History  Substance Use Topics  . Smoking status: Former Smoker -- 0.00 packs/day for 47 years    Types: Cigarettes    Quit date: 02/26/2015  . Smokeless tobacco: Never Used  . Alcohol Use: 0.0 oz/week    0 Standard drinks or equivalent per week    Review of Systems    Allergies  Review of patient's allergies indicates no known allergies.  Home Medications   Prior to Admission medications   Medication Sig Start Date End Date Taking? Authorizing Provider  acetaminophen (TYLENOL) 325 MG tablet Take 650 mg by mouth every 6 (six) hours as needed.   Yes Historical Provider, MD  aspirin EC 81 MG tablet Take 81 mg by mouth daily.    Yes Historical Provider, MD  pantoprazole (PROTONIX) 40 MG tablet Take 1 tablet (40 mg  total) by mouth daily. 03/19/15 03/18/16 Yes Tammy S Parrett, NP  valsartan-hydrochlorothiazide (DIOVAN HCT) 160-25 MG per tablet Take 1 tablet by mouth daily. 11/06/14  Yes Tanda Rockers, MD  predniSONE (DELTASONE) 10 MG tablet 4 tabs for 2 days, then 3 tabs for 2 days, 2 tabs for 2 days, then 1 tab for 2 days, then stop Patient not taking: Reported on 04/02/2015 03/19/15   Tammy S Parrett, NP   BP 132/79 mmHg  Pulse 62  Temp(Src) 97.7 F (36.5 C) (Oral)  Resp 15  Ht '5\' 11"'$  (1.803 m)  Wt 205 lb (92.987 kg)  BMI 28.60 kg/m2  SpO2 95% Physical Exam  Constitutional: He is oriented to person, place, and time. Vital signs are normal. He appears well-developed and well-nourished.  Non-toxic appearance. He does not appear ill. No distress.  HENT:  Head: Normocephalic and atraumatic.  Nose: Nose normal.  Mouth/Throat: Oropharynx is clear and moist. No oropharyngeal exudate.  Eyes: Conjunctivae and EOM are normal. Pupils are equal, round, and reactive to light. No scleral icterus.  Neck: Normal range of motion. Neck supple. No tracheal deviation, no edema, no erythema and normal range of motion present. No thyroid mass and no thyromegaly present.  Cardiovascular: Normal rate, regular rhythm, S1 normal, S2 normal, normal heart sounds,  intact distal pulses and normal pulses.  Exam reveals no gallop and no friction rub.   No murmur heard. Pulses:      Radial pulses are 2+ on the right side, and 2+ on the left side.       Dorsalis pedis pulses are 2+ on the right side, and 2+ on the left side.  Pulmonary/Chest: Effort normal and breath sounds normal. No respiratory distress. He has no wheezes. He has no rhonchi. He has no rales.  Abdominal: Soft. Normal appearance and bowel sounds are normal. He exhibits no distension, no ascites and no mass. There is no hepatosplenomegaly. There is no tenderness. There is no rebound, no guarding and no CVA tenderness.  Musculoskeletal: Normal range of motion. He  exhibits no edema or tenderness.  Lymphadenopathy:    He has no cervical adenopathy.  Neurological: He is alert and oriented to person, place, and time. He has normal strength. No cranial nerve deficit or sensory deficit.  Skin: Skin is warm, dry and intact. No petechiae and no rash noted. He is not diaphoretic. No erythema. No pallor.  Psychiatric: He has a normal mood and affect. His behavior is normal. Judgment normal.  Nursing note and vitals reviewed.   ED Course  Procedures (including critical care time) Labs Review Labs Reviewed  BASIC METABOLIC PANEL - Abnormal; Notable for the following:    Glucose, Bld 129 (*)    BUN <5 (*)    All other components within normal limits  CBC WITH DIFFERENTIAL/PLATELET  TROPONIN I  Randolm Idol, ED    Imaging Review Dg Chest 2 View  04/02/2015  CLINICAL DATA:  Subacute onset of generalized chest pain and shortness of breath. Initial encounter. EXAM: CHEST  2 VIEW COMPARISON:  Chest radiograph performed 03/17/2015 FINDINGS: The lungs are well-aerated. Mild vascular congestion is noted. There is no evidence of focal opacification, pleural effusion or pneumothorax. The heart is mildly enlarged. No acute osseous abnormalities are seen. Anterior and lateral osteophytes are seen along the thoracic spine. IMPRESSION: Mild vascular congestion and mild cardiomegaly. Lungs remain grossly clear. Electronically Signed   By: Garald Balding M.D.   On: 04/02/2015 05:19   I have personally reviewed and evaluated these images and lab results as part of my medical decision-making.   EKG Interpretation   Date/Time:  Friday April 02 2015 04:27:18 EDT Ventricular Rate:  67 PR Interval:  191 QRS Duration: 95 QT Interval:  427 QTC Calculation: 451 R Axis:   46 Text Interpretation:  Sinus rhythm Ventricular premature complex  Borderline T abnormalities, anterior leads No significant change since  last tracing Confirmed by Glynn Octave [phone])  on 04/02/2015  5:21:11 AM      MDM   Final diagnoses:  None    Patient presents to the ED for evaluation of CP.  He has frequent visits for GERD, however he does have risk factors.  Will obtain 2 sets of troponins and EKGs for evaluation.  Given ranitidine, maalox, and reglan which have improved his symptoms. Will sign out to oncoming provider for repeat trop and EKG at 0730 for dispo.  Everlene Balls, MD 04/02/15 [phone]

## 2015-04-02 NOTE — Discharge Instructions (Signed)

## 2015-04-03 ENCOUNTER — Ambulatory Visit (INDEPENDENT_AMBULATORY_CARE_PROVIDER_SITE_OTHER): Payer: Medicare Other

## 2015-04-03 ENCOUNTER — Telehealth: Payer: Self-pay | Admitting: Radiology

## 2015-04-03 ENCOUNTER — Other Ambulatory Visit: Payer: Self-pay | Admitting: Radiology

## 2015-04-03 ENCOUNTER — Ambulatory Visit (INDEPENDENT_AMBULATORY_CARE_PROVIDER_SITE_OTHER): Payer: Medicare Other | Admitting: Family Medicine

## 2015-04-03 VITALS — BP 132/74 | HR 64 | Temp 97.7°F | Resp 18 | Ht 70.0 in | Wt 204.0 lb

## 2015-04-03 DIAGNOSIS — R05 Cough: Secondary | ICD-10-CM

## 2015-04-03 DIAGNOSIS — R0602 Shortness of breath: Secondary | ICD-10-CM | POA: Diagnosis not present

## 2015-04-03 DIAGNOSIS — R059 Cough, unspecified: Secondary | ICD-10-CM

## 2015-04-03 DIAGNOSIS — R079 Chest pain, unspecified: Secondary | ICD-10-CM

## 2015-04-03 DIAGNOSIS — K219 Gastro-esophageal reflux disease without esophagitis: Secondary | ICD-10-CM | POA: Diagnosis not present

## 2015-04-03 LAB — POCT CBC
GRANULOCYTE PERCENT: 47.7 % (ref 37–80)
HEMATOCRIT: 47.2 % (ref 43.5–53.7)
Hemoglobin: 16.2 g/dL (ref 14.1–18.1)
LYMPH, POC: 3.1 (ref 0.6–3.4)
MCH, POC: 31.9 pg — AB (ref 27–31.2)
MCHC: 34.3 g/dL (ref 31.8–35.4)
MCV: 93.1 fL (ref 80–97)
MID (CBC): 0.6 (ref 0–0.9)
MPV: 5.7 fL (ref 0–99.8)
POC GRANULOCYTE: 3.3 (ref 2–6.9)
POC LYMPH %: 44.3 % (ref 10–50)
POC MID %: 8 %M (ref 0–12)
Platelet Count, POC: 225 10*3/uL (ref 142–424)
RBC: 5.07 M/uL (ref 4.69–6.13)
RDW, POC: 15.8 %
WBC: 6.9 10*3/uL (ref 4.6–10.2)

## 2015-04-03 MED ORDER — SUCRALFATE 1 GM/10ML PO SUSP: 1.0000 g | Freq: Three times a day (TID) | ORAL | Status: DC

## 2015-04-03 MED ORDER — GI COCKTAIL ~~LOC~~
30.0000 mL | Freq: Once | ORAL | Status: AC
  Administered 2015-04-03: 30 mL via ORAL

## 2015-04-03 MED ORDER — SUCRALFATE 1 G PO TABS: 1.0000 g | ORAL_TABLET | Freq: Three times a day (TID) | ORAL | Status: DC

## 2015-04-03 NOTE — Patient Instructions (Signed)
As your symptoms improved in office - including with GI cocktail - your symptoms appear to be due to GERD or your esophagus.  Try sleeping propped up for now, avoid alcohol and tobacco use, continue pantoprazole once per day, add Carafate as discussed 4 times per day, and avoid foods below that make acid reflux (heartburn) worse. I will refer you to a gastroenterologist as discussed.   If worsening cough, shortness of breath, fever, or return of chest pain as you had this morning - return here, emergency room or call 911 as discussed.  Return to the clinic or go to the nearest emergency room if any of your symptoms worsen or new symptoms occur.  Nonspecific Chest Pain  Chest pain can be caused by many different conditions. There is always a chance that your pain could be related to something serious, such as a heart attack or a blood clot in your lungs. Chest pain can also be caused by conditions that are not life-threatening. If you have chest pain, it is very important to follow up with your health care provider. CAUSES  Chest pain can be caused by:  Heartburn.  Pneumonia or bronchitis.  Anxiety or stress.  Inflammation around your heart (pericarditis) or lung (pleuritis or pleurisy).  A blood clot in your lung.  A collapsed lung (pneumothorax). It can develop suddenly on its own (spontaneous pneumothorax) or from trauma to the chest.  Shingles infection (varicella-zoster virus).  Heart attack.  Damage to the bones, muscles, and cartilage that make up your chest wall. This can include:  Bruised bones due to injury.  Strained muscles or cartilage due to frequent or repeated coughing or overwork.  Fracture to one or more ribs.  Sore cartilage due to inflammation (costochondritis). RISK FACTORS  Risk factors for chest pain may include:  Activities that increase your risk for trauma or injury to your chest.  Respiratory infections or conditions that cause frequent  coughing.  Medical conditions or overeating that can cause heartburn.  Heart disease or family history of heart disease.  Conditions or health behaviors that increase your risk of developing a blood clot.  Having had chicken pox (varicella zoster). SIGNS AND SYMPTOMS Chest pain can feel like:  Burning or tingling on the surface of your chest or deep in your chest.  Crushing, pressure, aching, or squeezing pain.  Dull or sharp pain that is worse when you move, cough, or take a deep breath.  Pain that is also felt in your back, neck, shoulder, or arm, or pain that spreads to any of these areas. Your chest pain may come and go, or it may stay constant. DIAGNOSIS Lab tests or other studies may be needed to find the cause of your pain. Your health care provider may have you take a test called an ambulatory ECG (electrocardiogram). An ECG records your heartbeat patterns at the time the test is performed. You may also have other tests, such as:  Transthoracic echocardiogram (TTE). During echocardiography, sound waves are used to create a picture of all of the heart structures and to look at how blood flows through your heart.  Transesophageal echocardiogram (TEE).This is a more advanced imaging test that obtains images from inside your body. It allows your health care provider to see your heart in finer detail.  Cardiac monitoring. This allows your health care provider to monitor your heart rate and rhythm in real time.  Holter monitor. This is a portable device that records your heartbeat and can help  to diagnose abnormal heartbeats. It allows your health care provider to track your heart activity for several days, if needed.  Stress tests. These can be done through exercise or by taking medicine that makes your heart beat more quickly.  Blood tests.  Imaging tests. TREATMENT  Your treatment depends on what is causing your chest pain. Treatment may include:  Medicines. These may  include:  Acid blockers for heartburn.  Anti-inflammatory medicine.  Pain medicine for inflammatory conditions.  Antibiotic medicine, if an infection is present.  Medicines to dissolve blood clots.  Medicines to treat coronary artery disease.  Supportive care for conditions that do not require medicines. This may include:  Resting.  Applying heat or cold packs to injured areas.  Limiting activities until pain decreases. HOME CARE INSTRUCTIONS  If you were prescribed an antibiotic medicine, finish it all even if you start to feel better.  Avoid any activities that bring on chest pain.  Do not use any tobacco products, including cigarettes, chewing tobacco, or electronic cigarettes. If you need help quitting, ask your health care provider.  Do not drink alcohol.  Take medicines only as directed by your health care provider.  Keep all follow-up visits as directed by your health care provider. This is important. This includes any further testing if your chest pain does not go away.  If heartburn is the cause for your chest pain, you may be told to keep your head raised (elevated) while sleeping. This reduces the chance that acid will go from your stomach into your esophagus.  Make lifestyle changes as directed by your health care provider. These may include:  Getting regular exercise. Ask your health care provider to suggest some activities that are safe for you.  Eating a heart-healthy diet. A registered dietitian can help you to learn healthy eating options.  Maintaining a healthy weight.  Managing diabetes, if necessary.  Reducing stress. SEEK MEDICAL CARE IF:  Your chest pain does not go away after treatment.  You have a rash with blisters on your chest.  You have a fever. SEEK IMMEDIATE MEDICAL CARE IF:   Your chest pain is worse.  You have an increasing cough, or you cough up blood.  You have severe abdominal pain.  You have severe weakness.  You  faint.  You have chills.  You have sudden, unexplained chest discomfort.  You have sudden, unexplained discomfort in your arms, back, neck, or jaw.  You have shortness of breath at any time.  You suddenly start to sweat, or your skin gets clammy.  You feel nauseous or you vomit.  You suddenly feel light-headed or dizzy.  Your heart begins to beat quickly, or it feels like it is skipping beats. These symptoms may represent a serious problem that is an emergency. Do not wait to see if the symptoms will go away. Get medical help right away. Call your local emergency services (911 in the [place] Do not drive yourself to the hospital.   This information is not intended to replace advice given to you by your health care provider. Make sure you discuss any questions you have with your health care provider.   Document Released: 03/08/2005 Document Revised: 06/19/2014 Document Reviewed: 01/02/2014 Elsevier Interactive Patient Education 2016 [place] for Gastroesophageal Reflux Disease, Adult When you have gastroesophageal reflux disease (GERD), the foods you eat and your eating habits are very important. Choosing the right foods can help ease the discomfort of GERD. WHAT GENERAL GUIDELINES  DO I NEED TO FOLLOW?  Choose fruits, vegetables, whole grains, low-fat dairy products, and low-fat meat, fish, and poultry.  Limit fats such as oils, salad dressings, butter, nuts, and avocado.  Keep a food diary to identify foods that cause symptoms.  Avoid foods that cause reflux. These may be different for different people.  Eat frequent small meals instead of three large meals each day.  Eat your meals slowly, in a relaxed setting.  Limit fried foods.  Cook foods using methods other than frying.  Avoid drinking alcohol.  Avoid drinking large amounts of liquids with your meals.  Avoid bending over or lying down until 2-3 hours after eating. WHAT FOODS ARE NOT  RECOMMENDED? The following are some foods and drinks that may worsen your symptoms: Vegetables Tomatoes. Tomato juice. Tomato and spaghetti sauce. Chili peppers. Onion and garlic. Horseradish. Fruits Oranges, grapefruit, and lemon (fruit and juice). Meats High-fat meats, fish, and poultry. This includes hot dogs, ribs, ham, sausage, salami, and bacon. Dairy Whole milk and chocolate milk. Sour cream. Cream. Butter. Ice cream. Cream cheese.  Beverages Coffee and tea, with or without caffeine. Carbonated beverages or energy drinks. Condiments Hot sauce. Barbecue sauce.  Sweets/Desserts Chocolate and cocoa. Donuts. Peppermint and spearmint. Fats and Oils High-fat foods, including [place] fries and potato chips. Other Vinegar. Strong spices, such as black pepper, white pepper, red pepper, cayenne, curry powder, cloves, ginger, and chili powder. The items listed above may not be a complete list of foods and beverages to avoid. Contact your dietitian for more information.   This information is not intended to replace advice given to you by your health care provider. Make sure you discuss any questions you have with your health care provider.   Document Released: 05/29/2005 Document Revised: 06/19/2014 Document Reviewed: 04/02/2013 Elsevier Interactive Patient Education [employer].

## 2015-04-03 NOTE — Telephone Encounter (Signed)
Patient is at pharmacy Carafate is not covered by his insurance it is $300, can you recommend an alternative ?

## 2015-04-03 NOTE — Telephone Encounter (Signed)
Changed to pill form of Carafate to see if this better covered by his insurance plan , advised patient (per Dr Carlota Raspberry) this is the best medication for his condition.

## 2015-04-03 NOTE — Progress Notes (Addendum)
Subjective:  This chart was scribed for Andre Parish, MD by Leandra Kern, Medical Scribe. This patient was seen in Room 1 and the patient's care was started at 8:47 AM.   Patient ID: Andre [name], male    DOB: 1941-02-13, 74 y.o.   MRN: [id number]  Chief Complaint  Patient presents with  . Chest Pain    pressure, x 2 weeks  . Cough    HPI HPI Comments: Andre [name] is a 74 y.o. male who presents to [facility] complaining of an intermittent pressure-like chest pain, onset 2 weeks ago. Pt was seen here multiple times for chest pain. He reports that he went to the ED yesterday and had elevated blood pressure. At the ED pt's symptoms were thought to be consistent with GERD. Test result showed Troponin the at was negative X2, normal CPC, BMP was nl except glucose 129, chest X-rays showed mild vascular congestion mild cardiomegaly.  Pt indicates that the pain was the worst it has ever been this morning. Pt describes the pain as a "moving, burning, grunting", and he reports that it radiates to his right shoulder, neck, and head. Pt states that the pain is more severe when he is laying down, and it mildly relives with movement.  He reports that he has associated symptoms of white phlegm cough, slight headache, shortness of breath, and heart burn. He denies the pain to be radiating to the left arm, diaphoresis, nausea, vomiting, dizziness. Pt states that his pain episodes sometimes occur after eating, or while watching TV. Pt is compliant with taking his protonix medications, in addition to the prednisone taper, Gold stage 0 that he was prescribe on Oct 7th. Pt was treated for COPD at that time. He had a norm cardiac cath and a normal myoview in 2015. Pt does not have chest pain currently.  Pt has quit smoking about 2 weeks ago. 4 drinks per week - liquor. Last drink 4 days ago, no recent increased use. Denies problem drinking. No hx of alcohol withdrawal sx's.   Pt's cardiac risk  factors include previous smoking, age, HTN,      Patient Active Problem List   Diagnosis Date Noted  . Erectile dysfunction 02/25/2015  . Atypical chest pain 02/25/2015  . Cigarette smoker 02/10/2015  . COPD GOLD 0 11/08/2014  . Upper airway cough syndrome 08/10/2014  . Dyspnea 07/08/2014  . Hemorrhoids 02/11/2014  . Constipation 05/30/2013  . 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 Headache 10/23/2011  . 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[place]) 10/23/2011  . GERD (gastroesophageal reflux disease) 10/23/2011  . Nicotine addiction 10/23/2011  . 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 Cervical stenosis of spinal canal 10/23/2011  . Essential hypertension, benign 07/10/2011   Past Medical History  Diagnosis Date  . Arthritis   . Hypertension   . 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[place])   . Weakness   . HA (headache)   . TIA (transient ischemic attack)   . Chest pain   . GERD (gastroesophageal reflux disease)    Past Surgical History  Procedure Laterality Date  . Surgical excision of rectal cancer     No Known Allergies Prior to Admission medications   Medication Sig Start Date End Date Taking? Authorizing Provider  acetaminophen (TYLENOL) 325 MG tablet Take 650 mg by mouth every 6 (six) hours as needed.   Yes Historical Provider, MD  aspirin EC 81 MG tablet Take 81 mg by mouth daily.    Yes Historical Provider, MD  pantoprazole (PROTONIX) 40 MG tablet Take 1 tablet (40 mg total) by mouth daily. 03/19/15 03/18/16 Yes Tammy S Parrett, NP  predniSONE (DELTASONE) 10 MG tablet 4 tabs for 2 days, then 3 tabs for 2 days, 2 tabs for 2 days, then 1 tab for 2 days, then stop 03/19/15  Yes Tammy S Parrett, NP  valsartan-hydrochlorothiazide (DIOVAN HCT) 160-25 MG per tablet Take 1 tablet by mouth daily. 11/06/14  Yes Tanda Rockers, MD   Social History   Social History  . Marital Status: Single    Spouse Name: N/A  . Number of Children: 11  . Years of Education: 12   Occupational History  .      retired   Social History  Main Topics  . Smoking status: Former Smoker -- 0.00 packs/day for 47 years    Types: Cigarettes    Quit date: 02/26/2015  . Smokeless tobacco: Never Used  . Alcohol Use: 0.0 oz/week    0 Standard drinks or equivalent per week  . Drug Use: No  . Sexual Activity: Not on file   Other Topics Concern  . Not on file   Social History Narrative   Patient is single, retired   Patient is right handed   Education level is high school   Caffeine consumption is 1 cup daily    Review of Systems  Constitutional: Negative for diaphoresis.  Respiratory: Positive for cough and shortness of breath.   Cardiovascular: Positive for chest pain.  Gastrointestinal: Negative for nausea and vomiting.  Neurological: Positive for headaches. Negative for dizziness.      Objective:   Physical Exam  Constitutional: He is oriented to person, place, and time. He appears well-developed and well-nourished. No distress.  HENT:  Head: Normocephalic and atraumatic.  Eyes: EOM are normal. Pupils are equal, round, and reactive to light.  Neck: Neck supple. No JVD present. Carotid bruit is not present.  Cardiovascular: Normal rate, regular rhythm and normal heart sounds.   No murmur heard. Pulmonary/Chest: Effort normal and breath sounds normal. No respiratory distress. He has no wheezes. He has no rales. He exhibits no tenderness (Unable to reproduce pain with palpation. ).  Musculoskeletal: He exhibits no edema.  Neurological: He is alert and oriented to person, place, and time. No cranial nerve deficit.  Skin: Skin is warm and dry.  Psychiatric: He has a normal mood and affect. His behavior is normal.  Nursing note and vitals reviewed.  Filed Vitals:   04/03/15 0807  BP: 132/74  Pulse: 64  Temp: 97.7 F (36.5 C)  Resp: 18  Height: '5\' 10"'$  (1.778 m)  Weight: 204 lb (92.534 kg)  SpO2: 98%   EKG: SR, rate 55, negative t wave V1-V2, no apparent changes from prior EKG.  [facility] reading (PRIMARY) by  Dr.  Carlota Raspberry: CXR: cardiomegaly, increased vascular markings and possible increased LLL. Markings - minimal change from yesterday.   CXR report:  IMPRESSION: Mild enlargement of cardiac silhouette.  Minimal bronchitic changes without acute infiltrate.  Results for orders placed or performed in visit on 04/03/15  POCT CBC  Result Value Ref Range   WBC 6.9 4.6 - 10.2 K/uL   Lymph, poc 3.1 0.6 - 3.4   POC LYMPH PERCENT 44.3 10 - 50 %L   MID (cbc) 0.6 0 - 0.9   POC MID % 8.0 0 - 12 %M   POC Granulocyte 3.3 2 - 6.9   Granulocyte percent 47.7 37 - 80 %G   RBC 5.07  4.69 - 6.13 M/uL   Hemoglobin 16.2 14.1 - 18.1 g/dL   HCT, POC 47.2 43.5 - 53.7 %   MCV 93.1 80 - 97 fL   MCH, POC 31.9 (A) 27 - 31.2 pg   MCHC 34.3 31.8 - 35.4 g/dL   RDW, POC 15.8 %   Platelet Count, POC 225.0 142 - 424 K/uL   MPV 5.7 0 - 99.8 fL   9:59 AM Less soreness in chest without intervention.  Still feels some burning in upper chest.  Did have relief of symptoms in ER with reglan and other meds given. Possible esophageal source. Will try GI cocktail - 30cc X1.     10:13 AM Further relief of discomfort in chest after GI cocktail. No pain or dyspnea.    Assessment & Plan:   Wasif Simonich is a 74 y.o. male Chest pain, unspecified chest pain type - Plan: EKG 12-Lead, gi cocktail (Maalox,Lidocaine,Donnatal), Ambulatory referral to Gastroenterology, sucralfate (CARAFATE) 1 GM/10ML suspension  Gastroesophageal reflux disease, esophagitis presence not specified - Plan: gi cocktail (Maalox,Lidocaine,Donnatal), Ambulatory referral to Gastroenterology, sucralfate (CARAFATE) 1 GM/10ML suspension  Cough - Plan: POCT CBC, DG Chest 2 View  Shortness of breath - Plan: Pro b natriuretic peptide  Recurrent chest pain, s/p eval in ER yesterday and multiple recent evaluations. Cycled troponin normal yesterday, no changes in EKG apparent today and no apparent new findings on CXR. eval by cardiology earlier this year and by cardiology  notes - no CAD, and echo ok last year. Relief of symptoms with GI cocktail and description appears to be GERD/esophagits or possible esophageal spasm.   -continue Protonix, add Carafate, continue to avoid smoking but also avoid alcohol and other triggers for GERD.   -refer to gastroenterology in next week  -ER/911 chest pain precautions reviewed.   Meds ordered this encounter  Medications  . gi cocktail (Maalox,Lidocaine,Donnatal)    Sig:   . sucralfate (CARAFATE) 1 GM/10ML suspension    Sig: Take 10 mLs (1 g total) by mouth 4 (four) times daily -  with meals and at bedtime.    Dispense:  420 mL    Refill:  0   Patient Instructions  As your symptoms improved in office - including with GI cocktail - your symptoms appear to be due to GERD or your esophagus.  Try sleeping propped up for now, avoid alcohol and tobacco use, continue pantoprazole once per day, add Carafate as discussed 4 times per day, and avoid foods below that make acid reflux (heartburn) worse. I will refer you to a gastroenterologist as discussed.   If worsening cough, shortness of breath, fever, or return of chest pain as you had this morning - return here, emergency room or call 911 as discussed.  Return to the clinic or go to the nearest emergency room if any of your symptoms worsen or new symptoms occur.  Nonspecific Chest Pain  Chest pain can be caused by many different conditions. There is always a chance that your pain could be related to something serious, such as a heart attack or a blood clot in your lungs. Chest pain can also be caused by conditions that are not life-threatening. If you have chest pain, it is very important to follow up with your health care provider. CAUSES  Chest pain can be caused by:  Heartburn.  Pneumonia or bronchitis.  Anxiety or stress.  Inflammation around your heart (pericarditis) or lung (pleuritis or pleurisy).  A blood clot in your lung.  A  collapsed lung (pneumothorax). It  can develop suddenly on its own (spontaneous pneumothorax) or from trauma to the chest.  Shingles infection (varicella-zoster virus).  Heart attack.  Damage to the bones, muscles, and cartilage that make up your chest wall. This can include:  Bruised bones due to injury.  Strained muscles or cartilage due to frequent or repeated coughing or overwork.  Fracture to one or more ribs.  Sore cartilage due to inflammation (costochondritis). RISK FACTORS  Risk factors for chest pain may include:  Activities that increase your risk for trauma or injury to your chest.  Respiratory infections or conditions that cause frequent coughing.  Medical conditions or overeating that can cause heartburn.  Heart disease or family history of heart disease.  Conditions or health behaviors that increase your risk of developing a blood clot.  Having had chicken pox (varicella zoster). SIGNS AND SYMPTOMS Chest pain can feel like:  Burning or tingling on the surface of your chest or deep in your chest.  Crushing, pressure, aching, or squeezing pain.  Dull or sharp pain that is worse when you move, cough, or take a deep breath.  Pain that is also felt in your back, neck, shoulder, or arm, or pain that spreads to any of these areas. Your chest pain may come and go, or it may stay constant. DIAGNOSIS Lab tests or other studies may be needed to find the cause of your pain. Your health care provider may have you take a test called an ambulatory ECG (electrocardiogram). An ECG records your heartbeat patterns at the time the test is performed. You may also have other tests, such as:  Transthoracic echocardiogram (TTE). During echocardiography, sound waves are used to create a picture of all of the heart structures and to look at how blood flows through your heart.  Transesophageal echocardiogram (TEE).This is a more advanced imaging test that obtains images from inside your body. It allows your health  care provider to see your heart in finer detail.  Cardiac monitoring. This allows your health care provider to monitor your heart rate and rhythm in real time.  Holter monitor. This is a portable device that records your heartbeat and can help to diagnose abnormal heartbeats. It allows your health care provider to track your heart activity for several days, if needed.  Stress tests. These can be done through exercise or by taking medicine that makes your heart beat more quickly.  Blood tests.  Imaging tests. TREATMENT  Your treatment depends on what is causing your chest pain. Treatment may include:  Medicines. These may include:  Acid blockers for heartburn.  Anti-inflammatory medicine.  Pain medicine for inflammatory conditions.  Antibiotic medicine, if an infection is present.  Medicines to dissolve blood clots.  Medicines to treat coronary artery disease.  Supportive care for conditions that do not require medicines. This may include:  Resting.  Applying heat or cold packs to injured areas.  Limiting activities until pain decreases. HOME CARE INSTRUCTIONS  If you were prescribed an antibiotic medicine, finish it all even if you start to feel better.  Avoid any activities that bring on chest pain.  Do not use any tobacco products, including cigarettes, chewing tobacco, or electronic cigarettes. If you need help quitting, ask your health care provider.  Do not drink alcohol.  Take medicines only as directed by your health care provider.  Keep all follow-up visits as directed by your health care provider. This is important. This includes any further testing if your chest  pain does not go away.  If heartburn is the cause for your chest pain, you may be told to keep your head raised (elevated) while sleeping. This reduces the chance that acid will go from your stomach into your esophagus.  Make lifestyle changes as directed by your health care provider. These may  include:  Getting regular exercise. Ask your health care provider to suggest some activities that are safe for you.  Eating a heart-healthy diet. A registered dietitian can help you to learn healthy eating options.  Maintaining a healthy weight.  Managing diabetes, if necessary.  Reducing stress. SEEK MEDICAL CARE IF:  Your chest pain does not go away after treatment.  You have a rash with blisters on your chest.  You have a fever. SEEK IMMEDIATE MEDICAL CARE IF:   Your chest pain is worse.  You have an increasing cough, or you cough up blood.  You have severe abdominal pain.  You have severe weakness.  You faint.  You have chills.  You have sudden, unexplained chest discomfort.  You have sudden, unexplained discomfort in your arms, back, neck, or jaw.  You have shortness of breath at any time.  You suddenly start to sweat, or your skin gets clammy.  You feel nauseous or you vomit.  You suddenly feel light-headed or dizzy.  Your heart begins to beat quickly, or it feels like it is skipping beats. These symptoms may represent a serious problem that is an emergency. Do not wait to see if the symptoms will go away. Get medical help right away. Call your local emergency services (911 in the [place] Do not drive yourself to the hospital.   This information is not intended to replace advice given to you by your health care provider. Make sure you discuss any questions you have with your health care provider.   Document Released: 03/08/2005 Document Revised: 06/19/2014 Document Reviewed: 01/02/2014 Elsevier Interactive Patient Education 2016 [place] for Gastroesophageal Reflux Disease, Adult When you have gastroesophageal reflux disease (GERD), the foods you eat and your eating habits are very important. Choosing the right foods can help ease the discomfort of GERD. WHAT GENERAL GUIDELINES DO I NEED TO FOLLOW?  Choose fruits, vegetables, whole  grains, low-fat dairy products, and low-fat meat, fish, and poultry.  Limit fats such as oils, salad dressings, butter, nuts, and avocado.  Keep a food diary to identify foods that cause symptoms.  Avoid foods that cause reflux. These may be different for different people.  Eat frequent small meals instead of three large meals each day.  Eat your meals slowly, in a relaxed setting.  Limit fried foods.  Cook foods using methods other than frying.  Avoid drinking alcohol.  Avoid drinking large amounts of liquids with your meals.  Avoid bending over or lying down until 2-3 hours after eating. WHAT FOODS ARE NOT RECOMMENDED? The following are some foods and drinks that may worsen your symptoms: Vegetables Tomatoes. Tomato juice. Tomato and spaghetti sauce. Chili peppers. Onion and garlic. Horseradish. Fruits Oranges, grapefruit, and lemon (fruit and juice). Meats High-fat meats, fish, and poultry. This includes hot dogs, ribs, ham, sausage, salami, and bacon. Dairy Whole milk and chocolate milk. Sour cream. Cream. Butter. Ice cream. Cream cheese.  Beverages Coffee and tea, with or without caffeine. Carbonated beverages or energy drinks. Condiments Hot sauce. Barbecue sauce.  Sweets/Desserts Chocolate and cocoa. Donuts. Peppermint and spearmint. Fats and Oils High-fat foods, including [place] fries and potato chips. Other  Vinegar. Strong spices, such as black pepper, white pepper, red pepper, cayenne, curry powder, cloves, ginger, and chili powder. The items listed above may not be a complete list of foods and beverages to avoid. Contact your dietitian for more information.   This information is not intended to replace advice given to you by your health care provider. Make sure you discuss any questions you have with your health care provider.   Document Released: 05/29/2005 Document Revised: 06/19/2014 Document Reviewed: 04/02/2013 Elsevier Interactive Patient Education [employer]         By signing my name below, I, Rawaa Al Rifaie, attest that this documentation has been prepared under the direction and in the presence of Andre Parish, MD.  Leandra Kern, Medical Scribe. 04/03/2015.  9:08 AM. I personally performed the services described in this documentation, which was scribed in my presence. The recorded information has been reviewed and considered, and addended by me as needed.

## 2015-04-04 NOTE — Telephone Encounter (Signed)
I have discussed with patient/ sent in tablets, he is to call back if this is not more cost effective

## 2015-04-04 NOTE — Telephone Encounter (Signed)
There is the suspension and tablet - either one if less expensive. Otherwise I do not know of an alternative. He could temporarily increase his protonix to twice per day until seen by gastroenterology.

## 2015-04-05 ENCOUNTER — Encounter (HOSPITAL_COMMUNITY): Payer: Self-pay | Admitting: Cardiology

## 2015-04-05 ENCOUNTER — Emergency Department (HOSPITAL_COMMUNITY)
Admission: EM | Admit: 2015-04-05 | Discharge: 2015-04-05 | Disposition: A | Discharge status: Home / Self Care | Payer: Medicare Other | Attending: Emergency Medicine | Admitting: Emergency Medicine

## 2015-04-05 ENCOUNTER — Emergency Department (HOSPITAL_COMMUNITY): Payer: Medicare Other

## 2015-04-05 DIAGNOSIS — M1990 Unspecified osteoarthritis, unspecified site: Secondary | ICD-10-CM | POA: Insufficient documentation

## 2015-04-05 DIAGNOSIS — Z87891 Personal history of nicotine dependence: Secondary | ICD-10-CM | POA: Diagnosis not present

## 2015-04-05 DIAGNOSIS — R0789 Other chest pain: Secondary | ICD-10-CM | POA: Diagnosis not present

## 2015-04-05 DIAGNOSIS — Z7982 Long term (current) use of aspirin: Secondary | ICD-10-CM | POA: Insufficient documentation

## 2015-04-05 DIAGNOSIS — I10 Essential (primary) hypertension: Secondary | ICD-10-CM | POA: Insufficient documentation

## 2015-04-05 DIAGNOSIS — R05 Cough: Secondary | ICD-10-CM | POA: Diagnosis not present

## 2015-04-05 DIAGNOSIS — K219 Gastro-esophageal reflux disease without esophagitis: Secondary | ICD-10-CM | POA: Diagnosis not present

## 2015-04-05 DIAGNOSIS — R079 Chest pain, unspecified: Secondary | ICD-10-CM | POA: Diagnosis present

## 2015-04-05 DIAGNOSIS — Z8673 Personal history of transient ischemic attack (TIA), and cerebral infarction without residual deficits: Secondary | ICD-10-CM | POA: Diagnosis not present

## 2015-04-05 DIAGNOSIS — Z85048 Personal history of other malignant neoplasm of rectum, rectosigmoid junction, and anus: Secondary | ICD-10-CM | POA: Diagnosis not present

## 2015-04-05 DIAGNOSIS — Z79899 Other long term (current) drug therapy: Secondary | ICD-10-CM | POA: Diagnosis not present

## 2015-04-05 LAB — COMPREHENSIVE METABOLIC PANEL
ALT: 47 U/L (ref 17–63)
AST: 53 U/L — ABNORMAL HIGH (ref 15–41)
Albumin: 4 g/dL (ref 3.5–5.0)
Alkaline Phosphatase: 74 U/L (ref 38–126)
Anion gap: 11 (ref 5–15)
BUN: 11 mg/dL (ref 6–20)
CHLORIDE: 99 mmol/L — AB (ref 101–111)
CO2: 25 mmol/L (ref 22–32)
Calcium: 9.3 mg/dL (ref 8.9–10.3)
Creatinine, Ser: 0.94 mg/dL (ref 0.61–1.24)
Glucose, Bld: 101 mg/dL — ABNORMAL HIGH (ref 65–99)
POTASSIUM: 4.2 mmol/L (ref 3.5–5.1)
Sodium: 135 mmol/L (ref 135–145)
Total Bilirubin: 1.6 mg/dL — ABNORMAL HIGH (ref 0.3–1.2)
Total Protein: 7.2 g/dL (ref 6.5–8.1)

## 2015-04-05 LAB — CBC
HCT: 48.2 % (ref 39.0–52.0)
Hemoglobin: 16.5 g/dL (ref 13.0–17.0)
MCH: 32 pg (ref 26.0–34.0)
MCHC: 34.2 g/dL (ref 30.0–36.0)
MCV: 93.6 fL (ref 78.0–100.0)
PLATELETS: 198 10*3/uL (ref 150–400)
RBC: 5.15 MIL/uL (ref 4.22–5.81)
RDW: 15.1 % (ref 11.5–15.5)
WBC: 8.8 10*3/uL (ref 4.0–10.5)

## 2015-04-05 LAB — LIPASE, BLOOD: LIPASE: 25 U/L (ref 11–51)

## 2015-04-05 LAB — TROPONIN I

## 2015-04-05 MED ORDER — HYDROCODONE-ACETAMINOPHEN 5-325 MG PO TABS: 1.0000 | ORAL_TABLET | Freq: Four times a day (QID) | ORAL | Status: DC | PRN

## 2015-04-05 MED ORDER — FENTANYL CITRATE (PF) 100 MCG/2ML IJ SOLN
50.0000 ug | Freq: Once | INTRAMUSCULAR | Status: AC
  Administered 2015-04-05: 50 ug via INTRAVENOUS
  Filled 2015-04-05: qty 2

## 2015-04-05 MED ORDER — PREDNISONE 20 MG PO TABS: 60.0000 mg | ORAL_TABLET | Freq: Every day | ORAL | Status: DC

## 2015-04-05 MED ORDER — PANTOPRAZOLE SODIUM 40 MG PO TBEC: 40.0000 mg | DELAYED_RELEASE_TABLET | Freq: Every day | ORAL | Status: DC

## 2015-04-05 MED ORDER — PREDNISONE 20 MG PO TABS
60.0000 mg | ORAL_TABLET | ORAL | Status: AC
  Administered 2015-04-05: 60 mg via ORAL
  Filled 2015-04-05: qty 3

## 2015-04-05 NOTE — ED Notes (Signed)
Reports chest pain that started across the top of his chest last night. Reports some SOB with the pain.

## 2015-04-05 NOTE — ED Provider Notes (Signed)
CSN: [id number]     Arrival date & time 04/05/15  1008 History   First MD Initiated Contact with Patient 04/05/15 1111     Chief Complaint  Patient presents with  . Chest Pain     (Consider location/radiation/quality/duration/timing/severity/associated sxs/prior Treatment) HPI Patient was a concern of ongoing chest pain. Pain is generally on the right side, with radiation inferiorly, and across the abdomen. Pain is present for at least one month, and is persistent, with no clear alleviating, exacerbating factors. Patient has been seen here and other facilities since onset, he notes he is currently taking sucralfate, with no change in his condition. No new fever, chills, vomiting, diarrhea, headache.   Past Medical History  Diagnosis Date  . Arthritis   . Hypertension   . 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[place])   . Weakness   . HA (headache)   . TIA (transient ischemic attack)   . Chest pain   . GERD (gastroesophageal reflux disease)    Past Surgical History  Procedure Laterality Date  . Surgical excision of rectal cancer     Family History  Problem Relation Age of Onset  . Hypertension Mother   . Cancer Mother    Social History  Substance Use Topics  . Smoking status: Former Smoker -- 0.00 packs/day for 47 years    Types: Cigarettes    Quit date: 02/26/2015  . Smokeless tobacco: Never Used  . Alcohol Use: 0.0 oz/week    0 Standard drinks or equivalent per week    Review of Systems  Constitutional:       Per HPI, otherwise negative  HENT:       Per HPI, otherwise negative  Respiratory:       Per HPI, otherwise negative  Cardiovascular:       Per HPI, otherwise negative  Gastrointestinal: Negative for vomiting.  Endocrine:       Negative aside from HPI  Genitourinary:       Neg aside from HPI   Musculoskeletal:       Per HPI, otherwise negative  Skin: Negative.   Neurological: Negative for syncope.      Allergies  Review of patient's allergies indicates no known  allergies.  Home Medications   Prior to Admission medications   Medication Sig Start Date End Date Taking? Authorizing Provider  acetaminophen (TYLENOL) 325 MG tablet Take 650 mg by mouth every 6 (six) hours as needed.    Historical Provider, MD  aspirin EC 81 MG tablet Take 81 mg by mouth daily.     Historical Provider, MD  pantoprazole (PROTONIX) 40 MG tablet Take 1 tablet (40 mg total) by mouth daily. 03/19/15 03/18/16  Tammy S Parrett, NP  predniSONE (DELTASONE) 10 MG tablet 4 tabs for 2 days, then 3 tabs for 2 days, 2 tabs for 2 days, then 1 tab for 2 days, then stop 03/19/15   Tammy S Parrett, NP  sucralfate (CARAFATE) 1 G tablet Take 1 tablet (1 g total) by mouth 3 (three) times daily. 04/03/15   Wendie Agreste, MD  valsartan-hydrochlorothiazide (DIOVAN HCT) 160-25 MG per tablet Take 1 tablet by mouth daily. 11/06/14   Tanda Rockers, MD   BP 115/65 mmHg  Pulse 74  Temp(Src) 98.2 F (36.8 C)  Resp 15  Ht '5\' 11"'$  (1.803 m)  Wt 205 lb (92.987 kg)  BMI 28.60 kg/m2  SpO2 94% Physical Exam  Constitutional: He is oriented to person, place, and time. He appears well-developed. No distress.  HENT:  Head: Normocephalic and atraumatic.  Eyes: Conjunctivae and EOM are normal.  Cardiovascular: Normal rate and regular rhythm.   Pulmonary/Chest: Effort normal. No stridor. No respiratory distress.  Abdominal: He exhibits no distension.  Musculoskeletal: He exhibits no edema.  Neurological: He is alert and oriented to person, place, and time.  Skin: Skin is warm and dry.  Psychiatric: He has a normal mood and affect.  Nursing note and vitals reviewed.   ED Course  Procedures (including critical care time) Labs Review Labs Reviewed  CBC  COMPREHENSIVE METABOLIC PANEL  LIPASE, BLOOD  TROPONIN I    Imaging Review Dg Chest 2 View  04/05/2015  CLINICAL DATA:  Chest pain for 3 days. Cough and congestion for 2 weeks. EXAM: CHEST  2 VIEW COMPARISON:  Chest x-ray dated 04/03/2015 and  chest x-ray dated 02/22/2015. FINDINGS: Cardiomediastinal silhouette is stable in size and configuration. Heart size remains upper normal. The mild peribronchial thickening, versus mild pulmonary vascular congestion, described on recent exams is stable. Lungs otherwise clear. No evidence of consolidating pneumonia. No pleural effusion. No pneumothorax. Lungs are at least mildly hyperexpanded suggesting COPD. IMPRESSION: 1. Stable chest x-ray. The mild peribronchial thickening versus mild pulmonary vascular congestion described on recent exams is unchanged, favor some degree of chronic bronchitic changes. 2. Lungs at least somewhat hyperexpanded suggesting underlying COPD. Electronically Signed   By: Franki Cabot M.D.   On: 04/05/2015 11:28   I have personally reviewed and evaluated these images and lab results as part of my medical decision-making.   EKG Interpretation   Date/Time:  Monday April 05 2015 10:23:13 EDT Ventricular Rate:  92 PR Interval:  168 QRS Duration: 84 QT Interval:  378 QTC Calculation: 467 R Axis:   -39 Text Interpretation:  Sinus rhythm with frequent Premature ventricular  complexes Left axis deviation Nonspecific ST abnormality Abnormal ECG  Sinus rhythm Premature ventricular complexes ST-t wave abnormality  Abnormal ekg Confirmed by Carmin Muskrat  MD [phone]) on 04/05/2015  10:27:13 AM     Chart review notes 6 emergency department visits in 6 months, for evaluation here 3 days ago for similar concerns.   Cardiac 90 sinus normal Pulse ox 100% room air normal  2:54 PM Patient in no distress. We discussed all findings again, his recent evaluations for chest pain. Patient has scheduled follow-up with his physician next week.   MDM   Patient presents with ongoing chest pain with no evidence for respiratory compromise, no evidence for infection, no evidence for pulmonary embolism. Patient's vital signs are stable, EKG is nonischemic, labs  unremarkable. Patient has had persistent chest pain, and given today's, and prior reassuring evaluation, patient will follow-up with cardiology in the clinic, pulmonology in the clinic.    Carmin Muskrat, MD 04/05/15 1455

## 2015-04-05 NOTE — Discharge Instructions (Signed)
As discussed, your evaluation today has been largely reassuring.  But, it is important that you monitor your condition carefully, and do not hesitate to return to the ED if you develop new, or concerning changes in your condition. ? ?Otherwise, please follow-up with your physician for appropriate ongoing care. ? ?

## 2015-04-06 LAB — PRO B NATRIURETIC PEPTIDE: PRO B NATRI PEPTIDE: 15.1 pg/mL (ref <126)

## 2015-04-08 ENCOUNTER — Ambulatory Visit (INDEPENDENT_AMBULATORY_CARE_PROVIDER_SITE_OTHER): Payer: Medicare Other | Admitting: Internal Medicine

## 2015-04-08 ENCOUNTER — Encounter: Payer: Self-pay | Admitting: Internal Medicine

## 2015-04-08 VITALS — BP 110/60 | HR 65 | Ht 71.0 in | Wt 201.4 lb

## 2015-04-08 DIAGNOSIS — R05 Cough: Secondary | ICD-10-CM

## 2015-04-08 DIAGNOSIS — R058 Other specified cough: Secondary | ICD-10-CM

## 2015-04-08 DIAGNOSIS — R0789 Other chest pain: Secondary | ICD-10-CM

## 2015-04-08 NOTE — Assessment & Plan Note (Signed)
I suspect this is musculoskeletal pain from coughing paroxysms related to upper airway cough syndrome.

## 2015-04-08 NOTE — Patient Instructions (Addendum)
Stop carafate and prednisone  Pantoprazole (protonix) 40 mg   Take  30-60 min before first meal of the day and Pepcid (famotidine)  20 mg one @  bedtime plus chlorpheniramine 4 mg 1-2 at bedtime until return to office - this is the best way to tell whether stomach acid is contributing to your problem.    GERD (REFLUX)  is an extremely common cause of respiratory symptoms just like yours , many times with no obvious heartburn at all.    It can be treated with medication, but also with lifestyle changes including elevation of the head of your bed (ideally with 6 inch  bed blocks),  Smoking cessation, avoidance of late meals, excessive alcohol, and avoid fatty foods, chocolate, peppermint, colas, red wine, and acidic juices such as orange juice.  NO MINT OR MENTHOL PRODUCTS SO NO COUGH DROPS  USE SUGARLESS CANDY INSTEAD (Jolley ranchers or Stover's or Life Savers) or even ice chips will also do - the key is to swallow to prevent all throat clearing. NO OIL BASED VITAMINS - use powdered substitutes.  Please see patient coordinator before you leave today  to schedule for diagnostic esophagram  Please schedule a follow up office visit in 4 weeks, sooner if needed with all active  meds in hand

## 2015-04-08 NOTE — Progress Notes (Signed)
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 Advice Only    from Dr. Johnsie [name]; CP that goes from RT to LT side x5 months; no cough spits up white mucus mainly at night; SOB w/activity.  every night when lies down has immediate choking> coughing x months Walking at [facility] ok, can't rake leaves  X 5 min or do [employer] s giving out due to sob  Confused with all the meds he's taking for gerd "they don't work" Cp is paroxysmal, more common when lies down p a big meal, some better on gerd rx, migrates across ant chest R to L. No pleuritic component.  rec Stop lisinopril Start avapro (valsartan) 160 mg daily  GERD diet Add pepcid ac 20 mg one at bedtime Call me with the name of the pill you are taking and I will tell you what to do with it > confirmed lisinopril  Please schedule a follow up office visit in 6 weeks, call sooner if needed with all active medicines with you     09/01/2014 f/u ov/Andre [name] re: cough gone, now cc sob  Chief Complaint  Patient presents with  . Acute Visit    Increased SOB and HA x 2 weeks.   cough is completely gone and lies flat ok/ no noct symptoms  Sob only with exertion like raking leaves, last did this ok 6 m ago Andre [name] tends to be in afternoon, never am's or waking him p hs  Not using gerd rx as rec rec Pantoprazole (protonix) 40 mg   Take 30-60 min before first meal of the day and Pepcid 20 mg one bedtime until return to office - this is the best way to tell whether stomach acid is contributing to your problem. GERD   Cut down on coffee and  try midrin one every 4 hours as needed for headache  Please remember to go to the lab  department downstairs for your tests - we will call you with the results when they are available. Please schedule a follow up office visit in 4 weeks, sooner if needed    Add did not go to lab     11/04/14 to ER with CP/ r/o MI    11/06/2014 acute  ov/Andre [name] re: new noct pattern nightly / cp and sob x since last ov never took gerd rx  Chief Complaint  Patient presents with  . Follow-up    [name] increased SOB, HA and cough since his last visit. He also c/o CP- wakes up him early every am and then he can not get back to sleep.   42 yards doe every time he tries, just about every night cough/ choking around 2 am / very easily confused with meds/ not sure really what he's taking Cp component is minimal and "like a stinging sensation" mid sternum just as common at rest as with exertion  rec Nortriptylline Take x 2 at bedtime Change the valsartan to valsartan-hct 160/25 Pantoprazole (protonix) 40 mg   Take  30-60 min before first meal of the day and Pepcid (famotidine)  20 mg one @  bedtime   GERD diet    02/09/2015 f/u ov/Andre [name] re: unexplaind doe / noct sob  Chief Complaint  Patient presents with  . Follow-up    [name] CP and HA on and off x 3 wks. He states not coughing much, but bringing up alot of white sputum.  He describes CP as dull and it is worse after he eats.    improved p last ov but continued to have sleep disruption every night never at hs or when wakes up at 7 am at usual hour, much    worse x 3 weeks Still smoking/ doe x 150 ft. Says brought meds but has more at home - does not have ppi or nortriptylline nor pepcid with him - very easily confused with details of medical care >> Add  chlortrimeton (chlorpheniramine) 4 mg  X 2 at bedtime to pepcid 20 mg each available at bedtime Continue Pantoprazole (protonix) 40 mg   Take  30-60 min before first meal of the day   GERD   If not better in 2  weeks, return with all active medications in hand      03/19/2015 Acute OV :  Pt presents for an acute office visit  Complains of chest tightness, prod cough with white mucus, and increased SOB x 2 weeks. Denies any sinus pressure/drainage, fever, nausea or vomitting. No discolored mucus or fever.  Went to ER x 2 this week for chest pain. Cardiac enzymes were neg x 3 . CXR w/  No acute process x 2  CBC and BMET ok.  Previous cardiac workup showed normal card cath in 2013 and reported  myoview in 2015 that normal.  Had not smoked for 3 weeks.  Migratory chest pain and soreness in anterior chest wall. No rash or known trauma.  Drinks etoh daily .  Denies drug use.  Restart Protonix '40mg'$ .daily before meal .  Restart Pepcid '20mg'$  At bedtime  -this is over the counter.  Mucinex DM Twice daily As needed  Cough/congestion  Prednisone taper over next week.  Follow up Andre [name]  In 4 weeks and As needed   Warm heat to chest wall .  Tylenol '325mg'$  1 tab every 8hr as needed for pain.     04/08/2015   Extended transition of care/ f/u ov/Andre [name] re: unexplained sob/ abd and chest pain > 1 year/ multple er eval's neg  Chief Complaint  Patient presents with  . Follow-up    Seen in ED twice in past 8 days - c/o chest and stomach discomfort - Spitting up alot of white mucus - sob at times with exertion - PT denies cough   Brought all meds but no h1 and h2 in bag, no amitrytilline  Cough is worse at hs "but it's not really a cough/ mucus just comes flying out"   Pain is migratory from top of R chest to pelvic area made worse by coughing Very confuses again with details of care but clear the  carafate nor the prednisone helping so far  No obvious day to day or daytime variability or assoc  chest tightness, subjective wheeze or overt sinus or hb symptoms. No unusual exp hx or h/o childhood pna/ asthma or knowledge of premature birth.   Also denies any obvious fluctuation of symptoms with weather or  environmental changes or other aggravating or alleviating factors except as outlined above  Current Medications, Allergies, Complete Past Medical History, Past Surgical History, Family History, and Social History were reviewed in [employer] record.  ROS  The following are not active complaints unless bolded sore throat, dysphagia, dental problems, itching, sneezing,  nasal congestion or excess/ purulent secretions, ear ache,   fever, chills, sweats, unintended wt loss, classically pleuritic or exertional cp, hemoptysis,  orthopnea pnd or leg swelling, presyncope, palpitations, abdominal pain, anorexia, nausea, vomiting, diarrhea  or change in bowel or bladder habits, change in stools or urine, dysuria,hematuria,  rash, arthralgias, visual complaints, headache, numbness, weakness or ataxia or problems with walking or coordination,  change in mood/affect or memory.              Objective:   Physical Exam  amb  hoarse bm nad / flat affect   11/06/14           211 >    02/09/2015   206> 206 03/19/2015 > 04/08/2015  201    Vital signs reviewed  HEENT: nl dentition, turbinates, and orophanx. Nl external ear canals without cough reflex   NECK :  without JVD/Nodes/TM/ nl carotid upstrokes bilaterally   LUNGS: no acc muscle use, clear to A and P bilaterally without cough on insp or exp maneuvers Tender along ant cw , no deformity or rash noted    CV:  RRR  no s3 or murmur or increase in P2, no edema   ABD:  soft and nontender with nl excursion in the supine position. No bruits or organomegaly, bowel sounds nl  MS:  warm without deformities, calf tenderness, cyanosis or clubbing  SKIN: warm and dry without lesions    NEURO:  alert, approp, no deficits       I personally reviewed images and agree with radiology impression as follows:  CXR:  04/05/15 1. Stable chest x-ray. The mild peribronchial thickening versus mild pulmonary vascular congestion described  on recent exams is unchanged, favor some degree of chronic bronchitic changes. 2. Lungs at least somewhat hyperexpanded suggesting underlying COPD.    Labs reviewed:      Chemistry      Component Value Date/Time   NA 135 04/05/2015 1136   K 4.2 04/05/2015 1136   CL 99* 04/05/2015 1136   CO2 25 04/05/2015 1136   BUN 11 04/05/2015 1136   CREATININE 0.94 04/05/2015 1136   CREATININE 0.70 08/28/2013 1210      Component Value Date/Time   CALCIUM 9.3 04/05/2015 1136   ALKPHOS 74 04/05/2015 1136   AST 53* 04/05/2015 1136   ALT 47 04/05/2015 1136   BILITOT 1.6* 04/05/2015 1136        Lab Results  Component Value Date   WBC 8.8 04/05/2015   HGB 16.5 04/05/2015   HCT 48.2 04/05/2015   MCV 93.6 04/05/2015   PLT 198 04/05/2015     Lab Results  Component Value Date   DDIMER 0.46 08/08/2011      Lab Results  Component Value Date   TSH 1.03 11/06/2014     Lab Results  Component Value Date   PROBNP 15.10 04/03/2015       Lab Results  Component Value Date   ESRSEDRATE 4 11/06/2014   ESRSEDRATE 2 04/02/2014   ESRSEDRATE 7 08/25/2013                   Assessment:

## 2015-04-08 NOTE — Assessment & Plan Note (Addendum)
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   These are the same pts (now being labeled as having "irritable larynx syndrome" by some cough centers) who not infrequently have a history of having failed to tolerate ace inhibitors,  dry powder inhalers or biphosphonates or report having atypical reflux symptoms that don't respond to standard doses of PPI , and are easily confused as having aecopd or asthma flares by even experienced allergists/ pulmonologists.   To my knowledge he has not stayed on any medications long enough to prove or disprove this diagnosis and no alternative diagnosis has evolved over the last year. Chart review indicates we have not yet done a diagnostic esophagram which I believe is the next logical step in the workup.  I had an extended discussion with the patient reviewing all relevant studies completed to date and  lasting 25 minutes of a 40 minute acute extended visit    1) reviewed:  Unlike when you get a prescription for eyeglasses, it's not possible to always walk out of this or any medical office with a perfect prescription that is immediately effective  based on any test that we offer here.    On the contrary, it may take several weeks for the full impact of changes recommened today - hopefully you will respond well.  If not, then we'll adjust your medication on your next visit accordingly, knowing more then than we  can possibly know now.    If we cannot verify that he is  taking   medications correctly that we recommended here, there is no real point returning to this clinic   2) Each maintenance medication was reviewed in detail including most importantly the difference between maintenance and prns and under what circumstances the prns are to be triggered using an action plan format that is not reflected in the computer generated alphabetically organized AVS.    Please see instructions for details which were reviewed in writing and the patient given a copy highlighting the part that I personally wrote and discussed at today's ov.

## 2015-04-15 ENCOUNTER — Ambulatory Visit (HOSPITAL_COMMUNITY)
Admission: RE | Admit: 2015-04-15 | Discharge: 2015-04-15 | Disposition: A | Discharge status: Home / Self Care | Payer: Medicare Other | Source: Ambulatory Visit | Attending: Internal Medicine | Admitting: Internal Medicine

## 2015-04-15 DIAGNOSIS — K228 Other specified diseases of esophagus: Secondary | ICD-10-CM | POA: Insufficient documentation

## 2015-04-15 DIAGNOSIS — R05 Cough: Secondary | ICD-10-CM | POA: Insufficient documentation

## 2015-04-15 DIAGNOSIS — K219 Gastro-esophageal reflux disease without esophagitis: Secondary | ICD-10-CM | POA: Diagnosis not present

## 2015-04-15 DIAGNOSIS — R058 Other specified cough: Secondary | ICD-10-CM

## 2015-04-15 NOTE — Progress Notes (Signed)
Quick Note:  Contacted pt with results per MW Pt expressed understanding, no further concerns. ______

## 2015-04-19 ENCOUNTER — Ambulatory Visit: Payer: Medicare Other | Admitting: Internal Medicine

## 2015-04-20 ENCOUNTER — Ambulatory Visit (INDEPENDENT_AMBULATORY_CARE_PROVIDER_SITE_OTHER): Payer: Medicare Other | Admitting: Family Medicine

## 2015-04-20 VITALS — BP 124/64 | HR 79 | Temp 98.2°F | Resp 16 | Ht 71.0 in | Wt 200.8 lb

## 2015-04-20 DIAGNOSIS — R0789 Other chest pain: Secondary | ICD-10-CM

## 2015-04-20 DIAGNOSIS — R05 Cough: Secondary | ICD-10-CM | POA: Diagnosis not present

## 2015-04-20 DIAGNOSIS — R058 Other specified cough: Secondary | ICD-10-CM

## 2015-04-20 DIAGNOSIS — R059 Cough, unspecified: Secondary | ICD-10-CM

## 2015-04-20 MED ORDER — PANTOPRAZOLE SODIUM 40 MG PO TBEC: 40.0000 mg | DELAYED_RELEASE_TABLET | Freq: Every day | ORAL | Status: DC

## 2015-04-20 NOTE — Patient Instructions (Addendum)
Your increased cough may be due to coming off one of the acid blockers or other causes of cough as discussed by Dr. [name]. I have included his instructions again below. Stop the peppermint.  Restart pantoprazole once per day, pepcid at bedtime (this is over the counter), chlorpheniramine 93m1-2 pills at bedtime (also over the counter).    Tylenol for soreness in chest if needed. Avoid nsiads like ibuprofen or alleve for now.   Instructions from Dr. WMelvyn Novas  Pantoprazole (protonix) 40 mg Take 30-60 min before first meal of the day and Pepcid (famotidine) 20 mg one @ bedtime plus chlorpheniramine 4 mg 1-2 at bedtime until return to office - this is the best way to tell whether stomach acid is contributing to your problem.   GERD (REFLUX) is an extremely common cause of respiratory symptoms just like yours , many times with no obvious heartburn at all.   It can be treated with medication, but also with lifestyle changes including elevation of the head of your bed (ideally with 6 inch bed blocks), Smoking cessation, avoidance of late meals, excessive alcohol, and avoid fatty foods, chocolate, peppermint, colas, red wine, and acidic juices such as orange juice.  NO MINT OR MENTHOL PRODUCTS SO NO COUGH DROPS  USE SUGARLESS CANDY INSTEAD (Jolley ranchers or Stover's or Life Savers) or even ice chips will also do - the key is to swallow to prevent all throat clearing. NO OIL BASED VITAMINS - use powdered substitutes.  Keep follow up with Dr. WMelvyn Novaslater this month,   Return to the clinic or go to the nearest emergency room if any of your symptoms worsen or new symptoms occur.  Chest Wall Pain Chest wall pain is pain in or around the bones and muscles of your chest. Sometimes, an injury causes this pain. Sometimes, the cause may not be known. This pain may take several weeks or longer to get better. HOME CARE INSTRUCTIONS  Pay attention to any changes in your symptoms. Take these actions to help  with your pain:   Rest as told by your health care provider.   Avoid activities that cause pain. These include any activities that use your chest muscles or your abdominal and side muscles to lift heavy items.   If directed, apply ice to the painful area:  Put ice in a plastic bag.  Place a towel between your skin and the bag.  Leave the ice on for 20 minutes, 2-3 times per day.  Take over-the-counter and prescription medicines only as told by your health care provider.  Do not use tobacco products, including cigarettes, chewing tobacco, and e-cigarettes. If you need help quitting, ask your health care provider.  Keep all follow-up visits as told by your health care provider. This is important. SEEK MEDICAL CARE IF:  You have a fever.  Your chest pain becomes worse.  You have new symptoms. SEEK IMMEDIATE MEDICAL CARE IF:  You have nausea or vomiting.  You feel sweaty or light-headed.  You have a cough with phlegm (sputum) or you cough up blood.  You develop shortness of breath.   This information is not intended to replace advice given to you by your health care provider. Make sure you discuss any questions you have with your health care provider.   Document Released: 05/29/2005 Document Revised: 02/17/2015 Document Reviewed: 08/24/2014 Elsevier Interactive Patient Education 2016 Elsevier Inc[employer]  Cough, Adult Coughing is a reflex that clears your throat and your airways. Coughing helps to heal  and protect your lungs. It is normal to cough occasionally, but a cough that happens with other symptoms or lasts a long time may be a sign of a condition that needs treatment. A cough may last only 2-3 weeks (acute), or it may last longer than 8 weeks (chronic). CAUSES Coughing is commonly caused by:  Breathing in substances that irritate your lungs.  A viral or bacterial respiratory infection.  Allergies.  Asthma.  Postnasal drip.  Smoking.  Acid backing up from the  stomach into the esophagus (gastroesophageal reflux).  Certain medicines.  Chronic lung problems, including COPD (or rarely, lung cancer).  Other medical conditions such as heart failure. HOME CARE INSTRUCTIONS  Pay attention to any changes in your symptoms. Take these actions to help with your discomfort:  Take medicines only as told by your health care provider.  If you were prescribed an antibiotic medicine, take it as told by your health care provider. Do not stop taking the antibiotic even if you start to feel better.  Talk with your health care provider before you take a cough suppressant medicine.  Drink enough fluid to keep your urine clear or pale yellow.  If the air is dry, use a cold steam vaporizer or humidifier in your bedroom or your home to help loosen secretions.  Avoid anything that causes you to cough at work or at home.  If your cough is worse at night, try sleeping in a semi-upright position.  Avoid cigarette smoke. If you smoke, quit smoking. If you need help quitting, ask your health care provider.  Avoid caffeine.  Avoid alcohol.  Rest as needed. SEEK MEDICAL CARE IF:   You have new symptoms.  You cough up pus.  Your cough does not get better after 2-3 weeks, or your cough gets worse.  You cannot control your cough with suppressant medicines and you are losing sleep.  You develop pain that is getting worse or pain that is not controlled with pain medicines.  You have a fever.  You have unexplained weight loss.  You have night sweats. SEEK IMMEDIATE MEDICAL CARE IF:  You cough up blood.  You have difficulty breathing.  Your heartbeat is very fast.   This information is not intended to replace advice given to you by your health care provider. Make sure you discuss any questions you have with your health care provider.   Document Released: 11/25/2010 Document Revised: 02/17/2015 Document Reviewed: 08/05/2014 Elsevier Interactive Patient  Education [employer].

## 2015-04-20 NOTE — Progress Notes (Addendum)
Subjective:  This chart was scribed for Andre [name], by [name], at [facility].  This patient was seen in room  14  and the patient's care was started at 8:32 AM.   Chief Complaint  Patient presents with  . Cough  . Sore Throat     Patient ID: Andre [name], male    DOB: Oct 09, 1940, 74 y.o.   MRN: [id number]  HPI  HPI Comments: Andre [name] is a 74 y.o. male who presents to the [facility] complaining of a cough with associated symptoms of worsening burning chest pain (since seeing Dr. [name]), slight headache, and congestion onset 1 month ago. He states that he has more of a burning sensation now in his chest and has been bringing up white phlegm (more than usual).  Patient states that he has already been seen for his complaints and went to the ED 10/24.  He states that he was able to get the Carafate pill which he took. He ran out of his pantoprazole two weeks ago and states that he is now out of it and does not have a prescription for it (he is not 100% sure if this is the medication that he has run out of. ) Patient is no longer smoking or eating late meals.  He has increased the number of pillows he is now using which he was advised to do. He has been complaint with using pepsids daily.  Denies fever/chills, SOB.   ------- Seen by me October 22nd with cough SOB and chest pain. Has recurrent chest pain thought to be GERD or esophagitis. He was perscribed Carafate  and continued Protonix - also referred to Gi He was seen 2 days later in the Ed with chest pain.  He had a reassuring exam. EKG and labs unremarkable in the ER.  Was sent to pulmonology on 10/27 and was seen by Dr.[name].  Thought to have a component of upper airway cough syndrome.  Thought to have musculoskeletal pain to due coughing related to upper airway coughing syndrome. 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 No evidence of mass, ulceration or  structure.  No significant reflux.     Patient Active Problem List   Diagnosis Date Noted  . Erectile dysfunction 02/25/2015  . Atypical chest pain 02/25/2015  . Cigarette smoker 02/10/2015  . COPD GOLD 0 11/08/2014  . Upper airway cough syndrome 08/10/2014  . Dyspnea 07/08/2014  . Hemorrhoids 02/11/2014  . Constipation 05/30/2013  . 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 Headache 10/23/2011  . 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[place]) 10/23/2011  . GERD (gastroesophageal reflux disease) 10/23/2011  . Nicotine addiction 10/23/2011  . 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 Cervical stenosis of spinal canal 10/23/2011  . Essential hypertension, benign 07/10/2011   Past Medical History  Diagnosis Date  . Arthritis   . Hypertension   . 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[place])   . Weakness   . HA (headache)   . TIA (transient ischemic attack)   . Chest pain   . GERD (gastroesophageal reflux disease)    Past Surgical History  Procedure Laterality Date  . Surgical excision of rectal cancer     No Known Allergies Prior to Admission medications   Medication Sig Start Date End Date Taking? Authorizing Provider  aspirin EC 81 MG tablet Take 81 mg by mouth daily.    Yes Historical Provider, [name]  HYDROcodone-acetaminophen (NORCO/VICODIN) 5-325 MG tablet Take 1 tablet  by mouth every 6 (six) hours as needed for severe pain. 04/05/15  Yes Carmin Muskrat, [name]  pantoprazole (PROTONIX) 40 MG tablet Take 1 tablet (40 mg total) by mouth daily. 04/05/15 04/04/16 Yes Carmin Muskrat, [name]  valsartan-hydrochlorothiazide (DIOVAN HCT) 160-25 MG per tablet Take 1 tablet by mouth daily. 11/06/14  Yes Tanda Rockers, [name]   Social History   Social History  . Marital Status: Single    Spouse Name: N/A  . Number of Children: 11  . Years of Education: 12   Occupational History  .      retired   Social History Main Topics  . Smoking status: Former Smoker -- 0.00 packs/day for 47 years    Types: Cigarettes    Quit date: 02/26/2015  .  Smokeless tobacco: Never Used  . Alcohol Use: 0.0 oz/week    0 Standard drinks or equivalent per week  . Drug Use: No  . Sexual Activity: Not on file   Other Topics Concern  . Not on file   Social History Narrative   Patient is single, retired   Patient is right handed   Education level is high school   Caffeine consumption is 1 cup daily        Review of Systems  Constitutional: Negative for fever and chills.  HENT: Positive for congestion.   Eyes: Negative for pain, redness and itching.  Respiratory: Positive for cough. Negative for choking and shortness of breath.   Gastrointestinal: Negative for nausea and vomiting.  Musculoskeletal: Negative for neck pain and neck stiffness.       Objective:   Physical Exam  Constitutional: He is oriented to person, place, and time. He appears well-developed and well-nourished.  HENT:  Head: Normocephalic and atraumatic.  Right Ear: Tympanic membrane, external ear and ear canal normal.  Left Ear: Tympanic membrane, external ear and ear canal normal.  Nose: No rhinorrhea.  Mouth/Throat: Oropharynx is clear and moist and mucous membranes are normal. No oropharyngeal exudate or posterior oropharyngeal erythema.  Eyes: Conjunctivae are normal. Pupils are equal, round, and reactive to light.  Neck: Neck supple.  Cardiovascular: Normal rate, regular rhythm, normal heart sounds and intact distal pulses.   No murmur heard. Pulmonary/Chest: Effort normal and breath sounds normal. He has no wheezes. He has no rhonchi. He has no rales.  Cannot reproduce the chest pain.   Abdominal: Soft. There is no tenderness.  Lymphadenopathy:    He has no cervical adenopathy.  Neurological: He is alert and oriented to person, place, and time.  Skin: Skin is warm and dry. No rash noted.  Psychiatric: He has a normal mood and affect. His behavior is normal.  Vitals reviewed.   Filed Vitals:   04/20/15 0824  BP: 124/64  Pulse: 79  Temp: 98.2 F  (36.8 C)  TempSrc: Oral  Resp: 16  Height: '5\' 11"'$  (1.803 m)  Weight: 200 lb 12.8 oz (91.082 kg)  SpO2: 97%   EKG: sinus rhythm.  Non specific t waves, v1 - v2.  No changes from previous.  No acute findings.      Assessment & Plan:   Nakota Elsen is a 74 y.o. male Burning in the chest - Plan: EKG 12-Lead  Cough - Plan: EKG 12-Lead  Upper airway cough syndrome  Persistent, recurrent cough with prior chest wall pain, now more burning type pain and off pantoprazole possibly. This was prescribed in ER few weeks ago, but not sure why he's been out for 2 weeks.  Still appears to be upper airway cough syndrome, has had workup by pulmonary. EKG stable and lungs clear on exam. Afebrile, vitals stable, exam reassuring.   -Refilled protonix.   -reinforced instructions from pulmonary - pasted again in AVS.  -Stop peppermint.   -follow up as planned with Dr. [name], rtc/ER precautions prior if worsening.   Meds ordered this encounter  Medications  . pantoprazole (PROTONIX) 40 MG tablet    Sig: Take 1 tablet (40 mg total) by mouth daily.    Dispense:  30 tablet    Refill:  0   Patient Instructions  Your increased cough may be due to coming off one of the acid blockers or other causes of cough as discussed by Dr. [name]. I have included his instructions again below. Stop the peppermint.  Restart pantoprazole once per day, pepcid at bedtime (this is over the counter), chlorpheniramine 60m1-2 pills at bedtime (also over the counter).    Tylenol for soreness in chest if needed. Avoid nsiads like ibuprofen or alleve for now.   Instructions from Dr. WMelvyn Novas  Pantoprazole (protonix) 40 mg Take 30-60 min before first meal of the day and Pepcid (famotidine) 20 mg one @ bedtime plus chlorpheniramine 4 mg 1-2 at bedtime until return to office - this is the best way to tell whether stomach acid is contributing to your problem.   GERD (REFLUX) is an extremely common cause of respiratory symptoms just  like yours , many times with no obvious heartburn at all.   It can be treated with medication, but also with lifestyle changes including elevation of the head of your bed (ideally with 6 inch bed blocks), Smoking cessation, avoidance of late meals, excessive alcohol, and avoid fatty foods, chocolate, peppermint, colas, red wine, and acidic juices such as orange juice.  NO MINT OR MENTHOL PRODUCTS SO NO COUGH DROPS  USE SUGARLESS CANDY INSTEAD (Jolley ranchers or Stover's or Life Savers) or even ice chips will also do - the key is to swallow to prevent all throat clearing. NO OIL BASED VITAMINS - use powdered substitutes.  Keep follow up with Dr. WMelvyn Novaslater this month,   Return to the clinic or go to the nearest emergency room if any of your symptoms worsen or new symptoms occur.  Chest Wall Pain Chest wall pain is pain in or around the bones and muscles of your chest. Sometimes, an injury causes this pain. Sometimes, the cause may not be known. This pain may take several weeks or longer to get better. HOME CARE INSTRUCTIONS  Pay attention to any changes in your symptoms. Take these actions to help with your pain:   Rest as told by your health care provider.   Avoid activities that cause pain. These include any activities that use your chest muscles or your abdominal and side muscles to lift heavy items.   If directed, apply ice to the painful area:  Put ice in a plastic bag.  Place a towel between your skin and the bag.  Leave the ice on for 20 minutes, 2-3 times per day.  Take over-the-counter and prescription medicines only as told by your health care provider.  Do not use tobacco products, including cigarettes, chewing tobacco, and e-cigarettes. If you need help quitting, ask your health care provider.  Keep all follow-up visits as told by your health care provider. This is important. SEEK MEDICAL CARE IF:  You have a fever.  Your chest pain becomes worse.  You have  new  symptoms. SEEK IMMEDIATE MEDICAL CARE IF:  You have nausea or vomiting.  You feel sweaty or light-headed.  You have a cough with phlegm (sputum) or you cough up blood.  You develop shortness of breath.   This information is not intended to replace advice given to you by your health care provider. Make sure you discuss any questions you have with your health care provider.   Document Released: 05/29/2005 Document Revised: 02/17/2015 Document Reviewed: 08/24/2014 Elsevier Interactive Patient Education 2016 Elsevier Inc.  Cough, Adult Coughing is a reflex that clears your throat and your airways. Coughing helps to heal and protect your lungs. It is normal to cough occasionally, but a cough that happens with other symptoms or lasts a long time may be a sign of a condition that needs treatment. A cough may last only 2-3 weeks (acute), or it may last longer than 8 weeks (chronic). CAUSES Coughing is commonly caused by:  Breathing in substances that irritate your lungs.  A viral or bacterial respiratory infection.  Allergies.  Asthma.  Postnasal drip.  Smoking.  Acid backing up from the stomach into the esophagus (gastroesophageal reflux).  Certain medicines.  Chronic lung problems, including COPD (or rarely, lung cancer).  Other medical conditions such as heart failure. HOME CARE INSTRUCTIONS  Pay attention to any changes in your symptoms. Take these actions to help with your discomfort:  Take medicines only as told by your health care provider.  If you were prescribed an antibiotic medicine, take it as told by your health care provider. Do not stop taking the antibiotic even if you start to feel better.  Talk with your health care provider before you take a cough suppressant medicine.  Drink enough fluid to keep your urine clear or pale yellow.  If the air is dry, use a cold steam vaporizer or humidifier in your bedroom or your home to help loosen  secretions.  Avoid anything that causes you to cough at work or at home.  If your cough is worse at night, try sleeping in a semi-upright position.  Avoid cigarette smoke. If you smoke, quit smoking. If you need help quitting, ask your health care provider.  Avoid caffeine.  Avoid alcohol.  Rest as needed. SEEK MEDICAL CARE IF:   You have new symptoms.  You cough up pus.  Your cough does not get better after 2-3 weeks, or your cough gets worse.  You cannot control your cough with suppressant medicines and you are losing sleep.  You develop pain that is getting worse or pain that is not controlled with pain medicines.  You have a fever.  You have unexplained weight loss.  You have night sweats. SEEK IMMEDIATE MEDICAL CARE IF:  You cough up blood.  You have difficulty breathing.  Your heartbeat is very fast.   This information is not intended to replace advice given to you by your health care provider. Make sure you discuss any questions you have with your health care provider.   Document Released: 11/25/2010 Document Revised: 02/17/2015 Document Reviewed: 08/05/2014 Elsevier Interactive Patient Education [employer].          I personally performed the services described in this documentation, which was scribed in my presence. The recorded information has been reviewed and considered, and addended by me as needed.

## 2015-05-07 ENCOUNTER — Ambulatory Visit (INDEPENDENT_AMBULATORY_CARE_PROVIDER_SITE_OTHER): Payer: Medicare Other | Admitting: Internal Medicine

## 2015-05-07 ENCOUNTER — Encounter: Payer: Self-pay | Admitting: Internal Medicine

## 2015-05-07 VITALS — BP 120/82 | HR 86 | Ht 71.0 in | Wt 205.0 lb

## 2015-05-07 DIAGNOSIS — R0600 Dyspnea, unspecified: Secondary | ICD-10-CM | POA: Diagnosis not present

## 2015-05-07 DIAGNOSIS — J449 Chronic obstructive pulmonary disease, unspecified: Secondary | ICD-10-CM | POA: Diagnosis not present

## 2015-05-07 DIAGNOSIS — R05 Cough: Secondary | ICD-10-CM | POA: Diagnosis not present

## 2015-05-07 DIAGNOSIS — R058 Other specified cough: Secondary | ICD-10-CM

## 2015-05-07 NOTE — Patient Instructions (Signed)
Best cough medication is mucinex dm 1200 every 12 hours as needed  Pantoprazole (protonix) 40 mg   Take  30-60 min before first meal of the day and Pepcid (famotidine)  20 mg one @  bedtime  And For drainage / throat tickle try take CHLORPHENIRAMINE  4 mg - take one every 4 hours as needed - available over the counter- may cause drowsiness so start with just a bedtime dose or two and see how you tolerate it before trying in daytime    GERD (REFLUX)  is an extremely common cause of respiratory symptoms just like yours , many times with no obvious heartburn at all.    It can be treated with medication, but also with lifestyle changes including elevation of the head of your bed (ideally with 6 inch  bed blocks),  Smoking cessation, avoidance of late meals, excessive alcohol, and avoid fatty foods, chocolate, peppermint, colas, red wine, and acidic juices such as orange juice.  NO MINT OR MENTHOL PRODUCTS SO NO COUGH DROPS  USE SUGARLESS CANDY INSTEAD (Jolley ranchers or Stover's or Life Savers) or even ice chips will also do - the key is to swallow to prevent all throat clearing. NO OIL BASED VITAMINS - use powdered substitutes.  Congratulations on not smoking - keep it up   If not satisfied return to clinic with all active medications in hand including your over the counter medications

## 2015-05-07 NOTE — Progress Notes (Signed)
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 Advice Only    from Andre. Johnsie [name]; CP that goes from RT to LT side x5 months; no cough spits up white mucus mainly at night; SOB w/activity.  every night when lies down has immediate choking> coughing x months Walking at [facility] ok, can't rake leaves  X 5 min or do [employer] s giving out due to sob  Confused with all the meds he's taking for gerd "they don't work" Cp is paroxysmal, more common when lies down p a big meal, some better on gerd rx, migrates across ant chest R to L. No pleuritic component.  rec Stop lisinopril Start avapro (valsartan) 160 mg daily  GERD diet Add pepcid ac 20 mg one at bedtime Call me with the name of the pill you are taking and I will tell you what to do with it > confirmed lisinopril  Please schedule a follow up office visit in 6 weeks, call sooner if needed with all active medicines with you     09/01/2014 f/u ov/Andre [name] re: cough gone, now cc sob  Chief Complaint  Patient presents with  . Acute Visit    Increased SOB and HA x 2 weeks.   cough is completely gone and lies flat ok/ no noct symptoms  Sob only with exertion like raking leaves, last did this ok 6 m ago Andre [name] tends to be in afternoon, never am's or waking him p hs  Not using gerd rx as rec rec Pantoprazole (protonix) 40 mg   Take 30-60 min before first meal of the day and Pepcid 20 mg one bedtime until return to office - this is the best way to tell whether stomach acid is contributing to your problem. GERD   Cut down on coffee and  try midrin one every 4 hours as needed for headache  Please remember to go to the lab  department downstairs for your tests - we will call you with the results when they are available. Please schedule a follow up office visit in 4 weeks, sooner if needed    Add did not go to lab     11/04/14 to ER with CP/ r/o MI    11/06/2014 acute  ov/Andre [name] re: new noct pattern nightly / cp and sob x since last ov never took gerd rx  Chief Complaint  Patient presents with  . Follow-up    [name] increased SOB, HA and cough since his last visit. He also c/o CP- wakes up him early every am and then he can not get back to sleep.   63 yards doe every time he tries, just about every night cough/ choking around 2 am / very easily confused with meds/ not sure really what he's taking Cp component is minimal and "like a stinging sensation" mid sternum just as common at rest as with exertion  rec Nortriptylline Take x 2 at bedtime Change the valsartan to valsartan-hct 160/25 Pantoprazole (protonix) 40 mg   Take  30-60 min before first meal of the day and Pepcid (famotidine)  20 mg one @  bedtime   GERD diet    02/09/2015 f/u ov/Andre [name] re: unexplaind doe / noct sob  Chief Complaint  Patient presents with  . Follow-up    [name] CP and HA on and off x 3 wks. He states not coughing much, but bringing up alot of white sputum.  He describes CP as dull and it is worse after he eats.    improved p last ov but continued to have sleep disruption every night never at hs or when wakes up at 7 am at usual hour, much    worse x 3 weeks Still smoking/ doe x 150 ft. Says brought meds but has more at home - does not have ppi or nortriptylline nor pepcid with him - very easily confused with details of medical care >> Add  chlortrimeton (chlorpheniramine) 4 mg  X 2 at bedtime to pepcid 20 mg each available at bedtime Continue Pantoprazole (protonix) 40 mg   Take  30-60 min before first meal of the day   GERD   If not better in 2  weeks, return with all active medications in hand      03/19/2015 Acute OV :  Pt presents for an acute office visit  Complains of chest tightness, prod cough with white mucus, and increased SOB x 2 weeks. Denies any sinus pressure/drainage, fever, nausea or vomitting. No discolored mucus or fever.  Went to ER x 2 this week for chest pain. Cardiac enzymes were neg x 3 . CXR w/  No acute process x 2  CBC and BMET ok.  Previous cardiac workup showed normal card cath in 2013 and reported  myoview in 2015 that normal.  Had not smoked for 3 weeks.  Migratory chest pain and soreness in anterior chest wall. No rash or known trauma.  Drinks etoh daily .  Denies drug use.  Restart Protonix '40mg'$ .daily before meal .  Restart Pepcid '20mg'$  At bedtime  -this is over the counter.  Mucinex DM Twice daily As needed  Cough/congestion  Prednisone taper over next week.  Follow up Andre. Melvyn [name]  In 4 weeks and As needed   Warm heat to chest wall .  Tylenol '325mg'$  1 tab every 8hr as needed for pain.     04/08/2015   Extended transition of care/ f/u ov/Andre [name] re: unexplained sob/ abd and chest pain > 1 year/ multple er eval's neg  Chief Complaint  Patient presents with  . Follow-up    Seen in ED twice in past 8 days - c/o chest and stomach discomfort - Spitting up alot of white mucus - sob at times with exertion - PT denies cough  Brought all meds but no h1 and h2 in bag, no amitrytilline  Cough is worse at hs "but it's not really a cough/ mucus just comes flying out"   Pain is migratory from top of R chest to pelvic area made worse by coughing Very confused again with details of care but clear the  carafate nor the prednisone helping so far rec Stop carafate and prednisone Pantoprazole (protonix) 40 mg   Take  30-60 min before first meal of the day and Pepcid (famotidine)  20 mg one @  bedtime plus chlorpheniramine 4 mg 1-2 at bedtime until return to office Please see patient coordinator before you leave today   to schedule for diagnostic esophagram> neg gerd/ pos presbyesophagus Please schedule a  follow up office visit in 4 weeks, sooner if needed with all active  meds in hand    05/07/2015  f/u ov/Tieisha Darden re: chronic cough / no meds  Chief Complaint  Patient presents with  . Follow-up    Pt here for 4 week f/u after esophagram. Pt states his CP has slightly improved. [name] prod cough with white mucus at night x 2 weeks. Pt also c/o slight SOB with activity.   cp improved / cough mostly when lying down   No obvious day to day or daytime variability or assoc  chest tightness, subjective wheeze or overt sinus or hb symptoms. No unusual exp hx or h/o childhood pna/ asthma or knowledge of premature birth.   Also denies any obvious fluctuation of symptoms with weather or environmental changes or other aggravating or alleviating factors except as outlined above   Current Medications, Allergies, Complete Past Medical History, Past Surgical History, Family History, and Social History were reviewed in [employer] record.  ROS  The following are not active complaints unless bolded sore throat, dysphagia, dental problems, itching, sneezing,  nasal congestion or excess/ purulent secretions, ear ache,   fever, chills, sweats, unintended wt loss, classically pleuritic or exertional cp, hemoptysis,  orthopnea pnd or leg swelling, presyncope, palpitations, abdominal pain, anorexia, nausea, vomiting, diarrhea  or change in bowel or bladder habits, change in stools or urine, dysuria,hematuria,  rash, arthralgias, visual complaints, headache, numbness, weakness or ataxia or problems with walking or coordination,  change in mood/affect or memory.              Objective:   Physical Exam  amb  bm nad / min congested sounding voluntary  cough   11/06/14    211 >    02/09/2015   206> 206 03/19/2015 > 04/08/2015  201 >  05/07/2015 208    Vital signs reviewed  HEENT: nl dentition, turbinates, and  orophanx. Nl external ear canals without cough reflex   NECK :  without JVD/Nodes/TM/ nl carotid upstrokes bilaterally   LUNGS: no acc muscle use, clear to A and P bilaterally without cough on insp or exp maneuvers   CV:  RRR  no s3 or murmur or increase in P2, no edema   ABD:  soft and nontender with nl excursion in the supine position. No bruits or organomegaly, bowel sounds nl  MS:  warm without deformities, calf tenderness, cyanosis or clubbing  SKIN: warm and dry without lesions    NEURO:  alert, approp, no deficits       I personally reviewed images and agree with radiology impression as follows:  CXR:  04/05/15 1. Stable chest x-ray. The mild peribronchial thickening versus mild pulmonary vascular congestion described on recent exams is unchanged, favor some degree of chronic bronchitic changes. 2. Lungs at least somewhat hyperexpanded suggesting underlying COPD.    Labs reviewed:      Chemistry      Component Value Date/Time   NA 135 04/05/2015 1136   K 4.2 04/05/2015 1136   CL 99* 04/05/2015 1136   CO2 25 04/05/2015 1136   BUN 11 04/05/2015 1136   CREATININE 0.94 04/05/2015 1136   CREATININE 0.70 08/28/2013 1210      Component Value Date/Time   CALCIUM 9.3 04/05/2015 1136   ALKPHOS 74 04/05/2015 1136   AST 53* 04/05/2015 1136   ALT 47 04/05/2015 1136   BILITOT 1.6* 04/05/2015 1136        Lab Results  Component Value Date   WBC 8.8 04/05/2015   HGB 16.5 04/05/2015   HCT 48.2 04/05/2015   MCV 93.6 04/05/2015   PLT 198 04/05/2015     Lab Results  Component Value Date   DDIMER 0.46 08/08/2011      Lab Results  Component Value Date   TSH 1.03 11/06/2014     Lab Results  Component Value Date   PROBNP 15.10 04/03/2015       Lab Results  Component Value Date   ESRSEDRATE 4 11/06/2014   ESRSEDRATE 2 04/02/2014   ESRSEDRATE 7 08/25/2013                   Assessment:

## 2015-05-08 ENCOUNTER — Encounter: Payer: Self-pay | Admitting: Internal Medicine

## 2015-05-08 NOTE — Assessment & Plan Note (Signed)
-    PFTs  07/29/14  FEV1  1.99 (67%) ratio 79% and dlco 62 corrects to 93 - Spirometry 02/09/2015 FEV1 2.4 (62%) ratio 76 while symptomatic   Congratulated on stop smoking/ reinforced maintain off > no rx needed

## 2015-05-08 NOTE — Assessment & Plan Note (Addendum)
-   MRI Brain 08/29/13 > nl sinuses  - trial off acei 08/10/2014 > improved 09/01/14  - restarted hs h2 h1 04/08/2015 >>>  - Dg Es 04/15/2015 >  1. Mild presbyesophagus. 2. No evidence of esophageal mass, ulceration or stricture. 3. No significant reflux   All symptoms except for noct cough better since quit smoking > reinforced  Still not clear what's causing the noct "mucus comes flying out but I don't cough" problem but I doubt it's a lung problem  rec max gerd rx/ rx for pnds then regroup with all meds in hand  I had an extended discussion with the patient reviewing all relevant studies completed to date and  lasting 15 to 20 minutes of a 25 minute visit on the following ongoing concerns:   1)  The standardized cough guidelines published in Chest by Lissa Morales in 2006 are still the best available and consist of a multiple step process (up to 12!) , not a single office visit,  and are intended  to address this problem logically,  with an alogrithm dependent on response to empiric treatment at  each progressive step  to determine a specific diagnosis with  minimal addtional testing needed. Therefore if adherence is an issue or can't be accurately verified,  it's very unlikely the standard evaluation and treatment will be successful here.    Furthermore, response to therapy (other than acute cough suppression, which should only be used short term with avoidance of narcotic containing cough syrups if possible), can be a gradual process for which the patient may perceive immediate benefit.  Unlike going to an eye doctor where the best perscription is almost always the first one and is immediately effective, this is almost never the case in the management of chronic cough syndromes. Therefore the patient needs to commit up front to consistently adhere to recommendations  for up to 6 weeks of therapy directed at the likely underlying problem(s) before the response can be reasonably evaluated.   2)  Each maintenance medication was reviewed in detail including most importantly the difference between maintenance and as needed and under what circumstances the prns are to be used.  Please see instructions for details which were reviewed in writing and the patient given a copy.

## 2015-05-08 NOTE — Assessment & Plan Note (Signed)
-   09/01/2014  Walked RA x 3 laps @ 185 ft each stopped due to end of study, nl pace, no desats   - 11/06/2014  Walked RA x 3 laps @ 185 ft each stopped due to end of study, nl pace, no desats, mild chest "stinging"  - max gred rx 11/06/2014 >  ? Followed recs - 02/09/2015  Walked RA x 3 laps @ 185 ft each stopped due to  End of study, nl pace, no sob or desat  But mild chest discomfort > ecg wnl  Improving

## 2015-06-03 ENCOUNTER — Ambulatory Visit (INDEPENDENT_AMBULATORY_CARE_PROVIDER_SITE_OTHER): Payer: Medicare Other | Admitting: Emergency Medicine

## 2015-06-03 ENCOUNTER — Ambulatory Visit (INDEPENDENT_AMBULATORY_CARE_PROVIDER_SITE_OTHER): Payer: Medicare Other

## 2015-06-03 VITALS — BP 138/80 | HR 73 | Temp 97.7°F | Resp 18 | Ht 70.5 in | Wt 205.0 lb

## 2015-06-03 DIAGNOSIS — J209 Acute bronchitis, unspecified: Secondary | ICD-10-CM

## 2015-06-03 DIAGNOSIS — J0140 Acute pansinusitis, unspecified: Secondary | ICD-10-CM | POA: Diagnosis not present

## 2015-06-03 DIAGNOSIS — R05 Cough: Secondary | ICD-10-CM

## 2015-06-03 DIAGNOSIS — R059 Cough, unspecified: Secondary | ICD-10-CM

## 2015-06-03 MED ORDER — HYDROCOD POLST-CPM POLST ER 10-8 MG/5ML PO SUER: 5.0000 mL | Freq: Two times a day (BID) | ORAL | Status: DC

## 2015-06-03 MED ORDER — AMOXICILLIN-POT CLAVULANATE 875-125 MG PO TABS: 1.0000 | ORAL_TABLET | Freq: Two times a day (BID) | ORAL | Status: DC

## 2015-06-03 NOTE — Patient Instructions (Signed)

## 2015-06-03 NOTE — Progress Notes (Signed)
Subjective:  Patient ID: Andre [name], male    DOB: 1940-10-08  Age: 74 y.o. MRN: [id number]  CC: Cough and Headache   HPI Andre [name] presents  age is 1 week history of history postnasal drainage and cough productive of purulent sputum. He has no fever or chills. He does have some exertional shortness breath. Has no nausea vomiting or sore throat. He's had no stool change or rash. Has no improvement with over-the-counter medication. He has a prior history of pneumonia  History Andre [name] has a past medical history of Arthritis; Hypertension; 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[place]); Weakness; HA (headache); TIA (transient ischemic attack); Chest pain; and GERD (gastroesophageal reflux disease).   He has past surgical history that includes surgical excision of rectal cancer.   His  family history includes Cancer in his mother; Hypertension in his mother.  He   reports that he quit smoking about 3 months ago. His smoking use included Cigarettes. He smoked 0.00 packs per day for 47 years. He has never used smokeless tobacco. He reports that he drinks alcohol. He reports that he does not use illicit drugs.  Outpatient Prescriptions Prior to Visit  Medication Sig Dispense Refill  . aspirin EC 81 MG tablet Take 81 mg by mouth daily.     Marland Kitchen HYDROcodone-acetaminophen (NORCO/VICODIN) 5-325 MG tablet Take 1 tablet by mouth every 6 (six) hours as needed for severe pain. 15 tablet 0  . pantoprazole (PROTONIX) 40 MG tablet Take 1 tablet (40 mg total) by mouth daily. 30 tablet 0  . valsartan-hydrochlorothiazide (DIOVAN HCT) 160-25 MG per tablet Take 1 tablet by mouth daily. 30 tablet 11   No facility-administered medications prior to visit.    Social History   Social History  . Marital Status: Single    Spouse Name: N/A  . Number of Children: 11  . Years of Education: 12   Occupational History  .      retired   Social History Main Topics  . Smoking status: Former Smoker -- 0.00 packs/day for 47 years   Types: Cigarettes    Quit date: 02/26/2015  . Smokeless tobacco: Never Used  . Alcohol Use: 0.0 oz/week    0 Standard drinks or equivalent per week  . Drug Use: No  . Sexual Activity: Not Asked   Other Topics Concern  . None   Social History Narrative   Patient is single, retired   Patient is right handed   Education level is high school   Caffeine consumption is 1 cup daily     Review of Systems  Constitutional: Negative for fever, chills and appetite change.  HENT: Positive for congestion, postnasal drip and rhinorrhea. Negative for ear pain, sinus pressure and sore throat.   Eyes: Negative for pain and redness.  Respiratory: Positive for cough and shortness of breath. Negative for wheezing.   Cardiovascular: Negative for leg swelling.  Gastrointestinal: Negative for nausea, vomiting, abdominal pain, diarrhea, constipation and blood in stool.  Endocrine: Negative for polyuria.  Genitourinary: Negative for dysuria, urgency, frequency and flank pain.  Musculoskeletal: Negative for gait problem.  Skin: Negative for rash.  Neurological: Negative for weakness and headaches.  Psychiatric/Behavioral: Negative for confusion and decreased concentration. The patient is not nervous/anxious.     Objective:  BP 138/80 mmHg  Pulse 73  Temp(Src) 97.7 F (36.5 C) (Oral)  Resp 18  Ht 5' 10.5" (1.791 m)  Wt 205 lb (92.987 kg)  BMI 28.99 kg/m2  SpO2 97%  Physical Exam  Constitutional: He  is oriented to person, place, and time. He appears well-developed and well-nourished. No distress.  HENT:  Head: Normocephalic and atraumatic.  Right Ear: External ear normal.  Left Ear: External ear normal.  Nose: Nose normal.  Eyes: Conjunctivae and EOM are normal. Pupils are equal, round, and reactive to light. No scleral icterus.  Neck: Normal range of motion. Neck supple. No tracheal deviation present.  Cardiovascular: Normal rate, regular rhythm and normal heart sounds.   Pulmonary/Chest:  Effort normal. No respiratory distress. He has no wheezes. He has no rales.  Abdominal: He exhibits no mass. There is no tenderness. There is no rebound and no guarding.  Musculoskeletal: He exhibits no edema.  Lymphadenopathy:    He has no cervical adenopathy.  Neurological: He is alert and oriented to person, place, and time. Coordination normal.  Skin: Skin is warm and dry. No rash noted.  Psychiatric: He has a normal mood and affect. His behavior is normal.      Assessment & Plan:   Andre [name] was seen today for cough and headache.  Diagnoses and all orders for this visit:  Cough -     DG Chest 2 View; Future  Acute bronchitis, unspecified organism  Acute pansinusitis, recurrence not specified  Other orders -     amoxicillin-clavulanate (AUGMENTIN) 875-125 MG tablet; Take 1 tablet by mouth 2 (two) times daily. -     chlorpheniramine-HYDROcodone (TUSSIONEX PENNKINETIC ER) 10-8 MG/5ML SUER; Take 5 mLs by mouth 2 (two) times daily.   I am having Andre [name] start on amoxicillin-clavulanate and chlorpheniramine-HYDROcodone. I am also having him maintain his aspirin EC, valsartan-hydrochlorothiazide, HYDROcodone-acetaminophen, and pantoprazole.  Meds ordered this encounter  Medications  . amoxicillin-clavulanate (AUGMENTIN) 875-125 MG tablet    Sig: Take 1 tablet by mouth 2 (two) times daily.    Dispense:  20 tablet    Refill:  0  . chlorpheniramine-HYDROcodone (TUSSIONEX PENNKINETIC ER) 10-8 MG/5ML SUER    Sig: Take 5 mLs by mouth 2 (two) times daily.    Dispense:  60 mL    Refill:  0    Appropriate red flag conditions were discussed with the patient as well as actions that should be taken.  Patient expressed his understanding.  Follow-up: Return if symptoms worsen or fail to improve.  Roselee Culver, MD  [facility] reading (PRIMARY) by  Dr. Ouida Sills. Unchanged chest.

## 2015-07-10 ENCOUNTER — Encounter (HOSPITAL_COMMUNITY): Payer: Self-pay | Admitting: Emergency Medicine

## 2015-07-10 ENCOUNTER — Emergency Department (HOSPITAL_COMMUNITY)
Admission: EM | Admit: 2015-07-10 | Discharge: 2015-07-10 | Disposition: A | Discharge status: Home / Self Care | Payer: Medicare Other | Attending: Emergency Medicine | Admitting: Emergency Medicine

## 2015-07-10 DIAGNOSIS — X501XXA Overexertion from prolonged static or awkward postures, initial encounter: Secondary | ICD-10-CM | POA: Insufficient documentation

## 2015-07-10 DIAGNOSIS — Z85048 Personal history of other malignant neoplasm of rectum, rectosigmoid junction, and anus: Secondary | ICD-10-CM | POA: Insufficient documentation

## 2015-07-10 DIAGNOSIS — Z87891 Personal history of nicotine dependence: Secondary | ICD-10-CM | POA: Insufficient documentation

## 2015-07-10 DIAGNOSIS — K219 Gastro-esophageal reflux disease without esophagitis: Secondary | ICD-10-CM | POA: Insufficient documentation

## 2015-07-10 DIAGNOSIS — R350 Frequency of micturition: Secondary | ICD-10-CM | POA: Diagnosis not present

## 2015-07-10 DIAGNOSIS — M1990 Unspecified osteoarthritis, unspecified site: Secondary | ICD-10-CM | POA: Insufficient documentation

## 2015-07-10 DIAGNOSIS — S3992XA Unspecified injury of lower back, initial encounter: Secondary | ICD-10-CM | POA: Insufficient documentation

## 2015-07-10 DIAGNOSIS — I10 Essential (primary) hypertension: Secondary | ICD-10-CM | POA: Diagnosis not present

## 2015-07-10 DIAGNOSIS — Z8673 Personal history of transient ischemic attack (TIA), and cerebral infarction without residual deficits: Secondary | ICD-10-CM | POA: Diagnosis not present

## 2015-07-10 DIAGNOSIS — Y9389 Activity, other specified: Secondary | ICD-10-CM | POA: Insufficient documentation

## 2015-07-10 DIAGNOSIS — M7918 Myalgia, other site: Secondary | ICD-10-CM

## 2015-07-10 DIAGNOSIS — S3991XA Unspecified injury of abdomen, initial encounter: Secondary | ICD-10-CM | POA: Diagnosis not present

## 2015-07-10 DIAGNOSIS — Y998 Other external cause status: Secondary | ICD-10-CM | POA: Diagnosis not present

## 2015-07-10 DIAGNOSIS — R109 Unspecified abdominal pain: Secondary | ICD-10-CM | POA: Diagnosis not present

## 2015-07-10 DIAGNOSIS — Z7982 Long term (current) use of aspirin: Secondary | ICD-10-CM | POA: Diagnosis not present

## 2015-07-10 DIAGNOSIS — Y9289 Other specified places as the place of occurrence of the external cause: Secondary | ICD-10-CM | POA: Insufficient documentation

## 2015-07-10 DIAGNOSIS — M791 Myalgia: Secondary | ICD-10-CM | POA: Diagnosis not present

## 2015-07-10 LAB — URINALYSIS, ROUTINE W REFLEX MICROSCOPIC
Bilirubin Urine: NEGATIVE
Glucose, UA: NEGATIVE mg/dL
HGB URINE DIPSTICK: NEGATIVE
Ketones, ur: NEGATIVE mg/dL
LEUKOCYTES UA: NEGATIVE
Nitrite: NEGATIVE
PROTEIN: NEGATIVE mg/dL
SPECIFIC GRAVITY, URINE: 1.017 (ref 1.005–1.030)
pH: 5 (ref 5.0–8.0)

## 2015-07-10 MED ORDER — IBUPROFEN 400 MG PO TABS
600.0000 mg | ORAL_TABLET | Freq: Once | ORAL | Status: AC
  Administered 2015-07-10: 600 mg via ORAL
  Filled 2015-07-10: qty 1

## 2015-07-10 MED ORDER — CYCLOBENZAPRINE HCL 10 MG PO TABS
10.0000 mg | ORAL_TABLET | Freq: Once | ORAL | Status: AC
  Administered 2015-07-10: 10 mg via ORAL
  Filled 2015-07-10: qty 1

## 2015-07-10 MED ORDER — METHOCARBAMOL 500 MG PO TABS: 500.0000 mg | ORAL_TABLET | Freq: Three times a day (TID) | ORAL | Status: DC | PRN

## 2015-07-10 NOTE — ED Notes (Signed)
[name] left flank pain and urinary frequency onset 1 week ago. Recently pt bent over to put air in a tire and the pain became worse.

## 2015-07-10 NOTE — Discharge Instructions (Signed)
Flank Pain °Flank pain refers to pain that is located on the side of the body between the upper abdomen and the back. The pain may occur over a short period of time (acute) or may be long-term or reoccurring (chronic). It may be mild or severe. Flank pain can be caused by many things. °CAUSES  °Some of the more common causes of flank pain include: °· Muscle strains.   °· Muscle spasms.   °· A disease of your spine (vertebral disk disease).   °· A lung infection (pneumonia).   °· Fluid around your lungs (pulmonary edema).   °· A kidney infection.   °· Kidney stones.   °· A very painful skin rash caused by the chickenpox virus (shingles).   °· Gallbladder disease.   °HOME CARE INSTRUCTIONS  °Home care will depend on the cause of your pain. In general, °· Rest as directed by your caregiver. °· Drink enough fluids to keep your urine clear or pale yellow. °· Only take over-the-counter or prescription medicines as directed by your caregiver. Some medicines may help relieve the pain. °· Tell your caregiver about any changes in your pain. °· Follow up with your caregiver as directed. °SEEK IMMEDIATE MEDICAL CARE IF:  °· Your pain is not controlled with medicine.   °· You have new or worsening symptoms. °· Your pain increases.   °· You have abdominal pain.   °· You have shortness of breath.   °· You have persistent nausea or vomiting.   °· You have swelling in your abdomen.   °· You feel faint or pass out.   °· You have blood in your urine. °· You have a fever or persistent symptoms for more than 2-3 days. °· You have a fever and your symptoms suddenly get worse. °MAKE SURE YOU:  °· Understand these instructions. °· Will watch your condition. °· Will get help right away if you are not doing well or get worse. °  °This information is not intended to replace advice given to you by your health care provider. Make sure you discuss any questions you have with your health care provider. °  °Document Released: 07/20/2005 Document  Revised: 02/21/2012 Document Reviewed: 01/11/2012 °Elsevier Interactive Patient Education ©2016 [employer] ° °

## 2015-07-10 NOTE — ED Provider Notes (Signed)
CSN: [id number]     Arrival date & time 07/10/15  1749 History   First MD Initiated Contact with Patient 07/10/15 1806     Chief Complaint  Patient presents with  . Flank Pain  . Urinary Frequency     (Consider location/radiation/quality/duration/timing/severity/associated sxs/prior Treatment) Patient is a 75 y.o. male presenting with back pain. The history is provided by the patient.  Back Pain Location:  Lumbar spine Quality:  Stabbing Radiates to:  Does not radiate Pain severity:  Moderate Pain is:  Same all the time Onset quality:  Sudden Duration: today around 430pm. Timing:  Constant Progression:  Worsening Chronicity:  New Context comment:  Bending over to put air in tires Relieved by:  Being still Worsened by:  Bending and twisting Ineffective treatments:  None tried Associated symptoms: no abdominal pain, no abdominal swelling, no bladder incontinence, no bowel incontinence, no dysuria, no fever, no paresthesias, no pelvic pain, no tingling and no weakness     Past Medical History  Diagnosis Date  . Arthritis   . Hypertension   . 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[place])   . Weakness   . HA (headache)   . TIA (transient ischemic attack)   . Chest pain   . GERD (gastroesophageal reflux disease)    Past Surgical History  Procedure Laterality Date  . Surgical excision of rectal cancer     Family History  Problem Relation Age of Onset  . Hypertension Mother   . Cancer Mother    Social History  Substance Use Topics  . Smoking status: Former Smoker -- 0.00 packs/day for 47 years    Types: Cigarettes    Quit date: 02/26/2015  . Smokeless tobacco: Never Used  . Alcohol Use: 0.0 oz/week    0 Standard drinks or equivalent per week    Review of Systems  Constitutional: Negative for fever and chills.  HENT: Negative.   Eyes: Negative for visual disturbance.  Respiratory: Negative for cough and shortness of breath.   Cardiovascular: Negative.   Gastrointestinal: Negative for  nausea, vomiting, abdominal pain, diarrhea and bowel incontinence.  Genitourinary: Negative for bladder incontinence, dysuria and pelvic pain.  Musculoskeletal: Positive for back pain. Negative for joint swelling.  Skin: Negative for pallor, rash and wound.  Neurological: Negative.  Negative for tingling, weakness and paresthesias.      Allergies  Review of patient's allergies indicates no known allergies.  Home Medications   Prior to Admission medications   Medication Sig Start Date End Date Taking? Authorizing Provider  acetaminophen (TYLENOL) 500 MG tablet Take 1,000 mg by mouth every 6 (six) hours as needed for headache.   Yes Historical Provider, MD  aspirin EC 81 MG tablet Take 81 mg by mouth daily.    Yes Historical Provider, MD  pantoprazole (PROTONIX) 40 MG tablet Take 1 tablet (40 mg total) by mouth daily. 04/20/15 04/19/16 Yes Wendie Agreste, MD  valsartan-hydrochlorothiazide (DIOVAN HCT) 160-25 MG per tablet Take 1 tablet by mouth daily. 11/06/14  Yes Tanda Rockers, MD  chlorpheniramine-HYDROcodone [facility] PENNKINETIC ER) 10-8 MG/5ML SUER Take 5 mLs by mouth 2 (two) times daily. 06/03/15   Roselee Culver, MD  HYDROcodone-acetaminophen (NORCO/VICODIN) 5-325 MG tablet Take 1 tablet by mouth every 6 (six) hours as needed for severe pain. 04/05/15   Carmin Muskrat, MD  methocarbamol (ROBAXIN) 500 MG tablet Take 1 tablet (500 mg total) by mouth every 8 (eight) hours as needed for muscle spasms. 07/10/15   Heriberto Antigua, MD   BP  130/90 mmHg  Pulse 81  Temp(Src) 98 F (36.7 C) (Oral)  Resp 22  SpO2 97% Physical Exam  Constitutional: He is oriented to person, place, and time. He appears well-developed and well-nourished. No distress.  HENT:  Head: Normocephalic and atraumatic.  Eyes: Pupils are equal, round, and reactive to light. No scleral icterus.  Neck: Normal range of motion. Neck supple.  Cardiovascular: Normal rate, regular rhythm, normal heart sounds and intact  distal pulses.   Pulmonary/Chest: Effort normal and breath sounds normal. He has no wheezes. He has no rales.  Abdominal: Soft. He exhibits no distension. There is no tenderness. There is no rebound and no guarding.  Musculoskeletal: He exhibits no edema.  No midline L spine ttp. No flank pain. TTP above iliac crest in the soft tissues worse with twisting. No sciatic.  Neurological: He is alert and oriented to person, place, and time. No cranial nerve deficit. He exhibits normal muscle tone. Coordination normal.  Skin: Skin is warm and dry. No rash noted. He is not diaphoretic. No erythema. No pallor.  Psychiatric: He has a normal mood and affect.  Nursing note and vitals reviewed.   ED Course  Procedures (including critical care time) Labs Review Labs Reviewed  URINALYSIS, ROUTINE W REFLEX MICROSCOPIC (NOT AT [facility])    Imaging Review No results found. I have personally reviewed and evaluated these images and lab results as part of my medical decision-making.   EKG Interpretation None      MDM   Final diagnoses:  Left flank pain  Musculoskeletal pain    Pt is a 75 year old male who presents with acute onset of left-sided flank pain and urinary frequency. The urinary frequency started 1 week ago but the flank pain started when he was bending over to put air in his tires around 4:30pm when he suddenly felt sharp pain. States that movement makes it worse. Denies any dysuria or hematuria. Further history and exam as above notable for stable vital signs and tenderness palpation along the left flank. UA isn't consistent with UTI or kidney stone. Patient likely has musculoskeletal strain. No hx of stones or trauma. No radiculopathy symptoms. No rash. Will give ibuprofen and flexeril.  I have reviewed all labs. Patient stable for discharge home.  I have reviewed all results with the patient. Advised to cont symptomatic management. Advised to f/u with pcp in 2-3 for further evaluation.  Patient agrees to stated plan. All questions answered. Advised to call or return to have any questions, new symptoms, change in symptoms, or symptoms that they do not understand.     Heriberto Antigua, MD 07/11/15 1049  Elnora Morrison, MD 07/11/15 2109

## 2015-07-11 ENCOUNTER — Emergency Department (HOSPITAL_COMMUNITY): Payer: Medicare Other

## 2015-07-11 ENCOUNTER — Encounter (HOSPITAL_COMMUNITY): Payer: Self-pay | Admitting: *Deleted

## 2015-07-11 ENCOUNTER — Emergency Department (HOSPITAL_COMMUNITY)
Admission: EM | Admit: 2015-07-11 | Discharge: 2015-07-11 | Disposition: A | Discharge status: Home / Self Care | Payer: Medicare Other | Attending: Emergency Medicine | Admitting: Emergency Medicine

## 2015-07-11 DIAGNOSIS — Z8673 Personal history of transient ischemic attack (TIA), and cerebral infarction without residual deficits: Secondary | ICD-10-CM | POA: Diagnosis not present

## 2015-07-11 DIAGNOSIS — Z87891 Personal history of nicotine dependence: Secondary | ICD-10-CM | POA: Insufficient documentation

## 2015-07-11 DIAGNOSIS — Z85040 Personal history of malignant carcinoid tumor of rectum: Secondary | ICD-10-CM | POA: Diagnosis not present

## 2015-07-11 DIAGNOSIS — M5442 Lumbago with sciatica, left side: Secondary | ICD-10-CM | POA: Diagnosis not present

## 2015-07-11 DIAGNOSIS — I10 Essential (primary) hypertension: Secondary | ICD-10-CM | POA: Insufficient documentation

## 2015-07-11 DIAGNOSIS — Z79899 Other long term (current) drug therapy: Secondary | ICD-10-CM | POA: Diagnosis not present

## 2015-07-11 DIAGNOSIS — Z7982 Long term (current) use of aspirin: Secondary | ICD-10-CM | POA: Insufficient documentation

## 2015-07-11 DIAGNOSIS — M1990 Unspecified osteoarthritis, unspecified site: Secondary | ICD-10-CM | POA: Diagnosis not present

## 2015-07-11 DIAGNOSIS — M545 Low back pain: Secondary | ICD-10-CM | POA: Diagnosis present

## 2015-07-11 DIAGNOSIS — K219 Gastro-esophageal reflux disease without esophagitis: Secondary | ICD-10-CM | POA: Diagnosis not present

## 2015-07-11 DIAGNOSIS — M5136 Other intervertebral disc degeneration, lumbar region: Secondary | ICD-10-CM | POA: Diagnosis not present

## 2015-07-11 LAB — I-STAT CHEM 8, ED
BUN: 9 mg/dL (ref 6–20)
CALCIUM ION: 1.13 mmol/L (ref 1.13–1.30)
CHLORIDE: 99 mmol/L — AB (ref 101–111)
Creatinine, Ser: 0.9 mg/dL (ref 0.61–1.24)
GLUCOSE: 142 mg/dL — AB (ref 65–99)
HCT: 57 % — ABNORMAL HIGH (ref 39.0–52.0)
Hemoglobin: 19.4 g/dL — ABNORMAL HIGH (ref 13.0–17.0)
Potassium: 3.9 mmol/L (ref 3.5–5.1)
Sodium: 137 mmol/L (ref 135–145)
TCO2: 26 mmol/L (ref 0–100)

## 2015-07-11 MED ORDER — IBUPROFEN 600 MG PO TABS: 600.0000 mg | ORAL_TABLET | Freq: Four times a day (QID) | ORAL | Status: DC | PRN

## 2015-07-11 MED ORDER — KETOROLAC TROMETHAMINE 30 MG/ML IJ SOLN
15.0000 mg | Freq: Once | INTRAMUSCULAR | Status: AC
  Administered 2015-07-11: 15 mg via INTRAVENOUS
  Filled 2015-07-11: qty 1

## 2015-07-11 MED ORDER — HYDROCODONE-ACETAMINOPHEN 5-325 MG PO TABS: 1.0000 | ORAL_TABLET | Freq: Four times a day (QID) | ORAL | Status: DC | PRN

## 2015-07-11 MED ORDER — FENTANYL CITRATE (PF) 100 MCG/2ML IJ SOLN
100.0000 ug | Freq: Once | INTRAMUSCULAR | Status: AC
Start: 2015-07-11 — End: 2015-07-11
  Administered 2015-07-11: 100 ug via INTRAVENOUS
  Filled 2015-07-11: qty 2

## 2015-07-11 NOTE — Discharge Instructions (Signed)
°  Sciatica °Sciatica is pain, weakness, numbness, or tingling along your sciatic nerve. The nerve starts in the lower back and runs down the back of each leg. Nerve damage or certain conditions pinch or put pressure on the sciatic nerve. This causes the pain, weakness, and other discomforts of sciatica. °HOME CARE  °· Only take medicine as told by your doctor. °· Apply ice to the affected area for 20 minutes. Do this 3-4 times a day for the first 48-72 hours. Then try heat in the same way. °· Exercise, stretch, or do your usual activities if these do not make your pain worse. °· Go to physical therapy as told by your doctor. °· Keep all doctor visits as told. °· Do not wear high heels or shoes that are not supportive. °· Get a firm mattress if your mattress is too soft to lessen pain and discomfort. °GET HELP RIGHT AWAY IF:  °· You cannot control when you poop (bowel movement) or pee (urinate). °· You have more weakness in your lower back, lower belly (pelvis), butt (buttocks), or legs. °· You have redness or puffiness (swelling) of your back. °· You have a burning feeling when you pee. °· You have pain that gets worse when you lie down. °· You have pain that wakes you from your sleep. °· Your pain is worse than past pain. °· Your pain lasts longer than 4 weeks. °· You are suddenly losing weight without reason. °MAKE SURE YOU:  °· Understand these instructions. °· Will watch this condition. °· Will get help right away if you are not doing well or get worse. °  °This information is not intended to replace advice given to you by your health care provider. Make sure you discuss any questions you have with your health care provider. °  °Document Released: 03/07/2008 Document Revised: 02/17/2015 Document Reviewed: 10/08/2011 °Elsevier Interactive Patient Education ©2016 [employer] ° ° °

## 2015-07-11 NOTE — ED Notes (Signed)
[name] sharp constant lower left flank pain that radiates to lower mid back x 5  days.  Pt came to ER last night for same.  Pt states pain is worse today.  Pt denies any other medical issues at this time.

## 2015-07-11 NOTE — ED Notes (Signed)
Patient returned from X-ray 

## 2015-07-11 NOTE — ED Notes (Signed)
Patient transported to X-ray 

## 2015-07-11 NOTE — ED Provider Notes (Signed)
CSN: [id number]     Arrival date & time 07/11/15  0831 History   First MD Initiated Contact with Patient 07/11/15 [phone]     Chief Complaint  Patient presents with  . Back Pain      HPI Patient presents with low back pain now is mostly on the left side which started about 4-5 days ago after he then over to put air in attire.  Pain is much worse when he moves about.  He was seen here yesterday and had a urinalysis done which is clear and started on a muscle relaxer.  He states the pain still persists.  He placed a heating pad on it last night with no improvement. Past Medical History  Diagnosis Date  . Arthritis   . Hypertension   . 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[place])   . Weakness   . HA (headache)   . TIA (transient ischemic attack)   . Chest pain   . GERD (gastroesophageal reflux disease)    Past Surgical History  Procedure Laterality Date  . Surgical excision of rectal cancer     Family History  Problem Relation Age of Onset  . Hypertension Mother   . Cancer Mother    Social History  Substance Use Topics  . Smoking status: Former Smoker -- 0.00 packs/day for 47 years    Types: Cigarettes    Quit date: 02/26/2015  . Smokeless tobacco: Never Used  . Alcohol Use: 0.0 oz/week    0 Standard drinks or equivalent per week    Review of Systems  All other systems reviewed and are negative  Allergies  Review of patient's allergies indicates no known allergies.  Home Medications   Prior to Admission medications   Medication Sig Start Date End Date Taking? Authorizing Provider  acetaminophen (TYLENOL) 500 MG tablet Take 1,000 mg by mouth every 6 (six) hours as needed for headache.    Historical Provider, MD  aspirin EC 81 MG tablet Take 81 mg by mouth daily.     Historical Provider, MD  chlorpheniramine-HYDROcodone (TUSSIONEX PENNKINETIC ER) 10-8 MG/5ML SUER Take 5 mLs by mouth 2 (two) times daily. 06/03/15   Roselee Culver, MD  HYDROcodone-acetaminophen (NORCO/VICODIN) 5-325 MG  tablet Take 1-2 tablets by mouth every 6 (six) hours as needed for severe pain. 07/11/15   Leonard Schwartz, MD  ibuprofen (ADVIL,MOTRIN) 600 MG tablet Take 1 tablet (600 mg total) by mouth every 6 (six) hours as needed. 07/11/15   Leonard Schwartz, MD  methocarbamol (ROBAXIN) 500 MG tablet Take 1 tablet (500 mg total) by mouth every 8 (eight) hours as needed for muscle spasms. 07/10/15   Heriberto Antigua, MD  pantoprazole (PROTONIX) 40 MG tablet Take 1 tablet (40 mg total) by mouth daily. 04/20/15 04/19/16  Wendie Agreste, MD  valsartan-hydrochlorothiazide (DIOVAN HCT) 160-25 MG per tablet Take 1 tablet by mouth daily. 11/06/14   Tanda Rockers, MD   BP 106/73 mmHg  Pulse 81  Resp 20  SpO2 97% Physical Exam  Constitutional: He is oriented to person, place, and time. He appears well-developed and well-nourished. No distress.  HENT:  Head: Normocephalic and atraumatic.  Eyes: Pupils are equal, round, and reactive to light.  Neck: Normal range of motion.  Cardiovascular: Normal rate and intact distal pulses.   Pulmonary/Chest: No respiratory distress.  Abdominal: Normal appearance. He exhibits no distension.  Musculoskeletal: Normal range of motion.       Back:  Neurological: He is alert and oriented to person, place, and  time. No cranial nerve deficit.  Skin: Skin is warm and dry. No rash noted.  Psychiatric: He has a normal mood and affect. His behavior is normal.  Nursing note and vitals reviewed.   ED Course  Procedures (including critical care time) Medications  ketorolac (TORADOL) 30 MG/ML injection 15 mg (not administered)  fentaNYL (SUBLIMAZE) injection 100 mcg (100 mcg Intravenous Given 07/11/15 0948)    Labs Review Labs Reviewed  I-STAT CHEM 8, ED - Abnormal; Notable for the following:    Chloride 99 (*)    Glucose, Bld 142 (*)    Hemoglobin 19.4 (*)    HCT 57.0 (*)    All other components within normal limits    Imaging Review Dg Lumbar Spine Complete  07/11/2015  CLINICAL  DATA:  Sharp constant left lower flank pain and low back pain for 5 days. 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 Degenerative facet disease diffusely. No fracture or malalignment. SI joints are symmetric and unremarkable. IMPRESSION: Degenerative disc and facet disease.  No acute findings. Electronically Signed   By: Rolm Baptise M.D.   On: 07/11/2015 09:44   I have personally reviewed and evaluated these images and lab results as part of my medical decision-making.    MDM   Final diagnoses:  Left-sided low back pain with left-sided sciatica       Leonard Schwartz, MD 07/11/15 [phone]

## 2015-07-16 ENCOUNTER — Ambulatory Visit (INDEPENDENT_AMBULATORY_CARE_PROVIDER_SITE_OTHER): Payer: Medicare Other | Admitting: Internal Medicine

## 2015-07-16 VITALS — BP 128/72 | HR 71 | Temp 97.8°F | Resp 17 | Ht 69.5 in | Wt 207.0 lb

## 2015-07-16 DIAGNOSIS — S338XXS Sprain of other parts of lumbar spine and pelvis, sequela: Secondary | ICD-10-CM | POA: Diagnosis not present

## 2015-07-16 DIAGNOSIS — S39012S Strain of muscle, fascia and tendon of lower back, sequela: Secondary | ICD-10-CM

## 2015-07-16 DIAGNOSIS — J0100 Acute maxillary sinusitis, unspecified: Secondary | ICD-10-CM | POA: Diagnosis not present

## 2015-07-16 MED ORDER — PREDNISONE 20 MG PO TABS: ORAL_TABLET | ORAL | Status: DC

## 2015-07-16 MED ORDER — AMOXICILLIN 500 MG PO CAPS: 1000.0000 mg | ORAL_CAPSULE | Freq: Two times a day (BID) | ORAL | Status: AC

## 2015-07-16 MED ORDER — HYDROCOD POLST-CPM POLST ER 10-8 MG/5ML PO SUER: 5.0000 mL | Freq: Two times a day (BID) | ORAL | Status: DC

## 2015-07-16 MED ORDER — CYCLOBENZAPRINE HCL 10 MG PO TABS: 10.0000 mg | ORAL_TABLET | Freq: Every day | ORAL | Status: DC

## 2015-07-16 MED ORDER — MELOXICAM 15 MG PO TABS: 15.0000 mg | ORAL_TABLET | Freq: Every day | ORAL | Status: DC

## 2015-07-16 NOTE — Progress Notes (Signed)
Subjective:  By signing my name below, I, Essence Howell, attest that this documentation has been prepared under the direction and in the presence of Leandrew Koyanagi, MD Electronically Signed: Ladene Artist, ED Scribe 07/16/2015 at 9:32 AM.   Patient ID: Andre [name], male    DOB: 07-Apr-1941, 75 y.o.   MRN: [id number]  Chief Complaint  Patient presents with  . Chest Pain    patient states he pulled a muscle   HPI HPI Comments: Andre [name] is a 75 y.o. male who presents to the [facility] complaining of constant, unchanged left low back pain onset 1 week ago. Pt states that he was bending down to put air in his tire when he pulled a muscle 1 week ago. He was seen in the ED on 07/10/15 and 07/11/15 for the same; discharged with flexeril and ibuprofen without significant relief. He has also tried ice without relief. Pt denies dysuria and left hip pain.   Cough Pt also complains of constant nasal and chest congestion since his visit in December. Pt reports that he has a very productive cough throughout the night and increased nasal congestion as well as sinus pressure during the day. He denies fever or SOB.  Past Medical History  Diagnosis Date  . Arthritis   . Hypertension   . 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[place])   . Weakness   . HA (headache)   . TIA (transient ischemic attack)   . Chest pain   . GERD (gastroesophageal reflux disease)    Current Outpatient Prescriptions on File Prior to Visit  Medication Sig Dispense Refill  . acetaminophen (TYLENOL) 500 MG tablet Take 1,000 mg by mouth every 6 (six) hours as needed for headache.    Marland Kitchen aspirin EC 81 MG tablet Take 81 mg by mouth daily.     . chlorpheniramine-HYDROcodone (TUSSIONEX PENNKINETIC ER) 10-8 MG/5ML SUER Take 5 mLs by mouth 2 (two) times daily. 60 mL 0  . HYDROcodone-acetaminophen (NORCO/VICODIN) 5-325 MG tablet Take 1-2 tablets by mouth every 6 (six) hours as needed for severe pain. 20 tablet 0  . ibuprofen (ADVIL,MOTRIN)  600 MG tablet Take 1 tablet (600 mg total) by mouth every 6 (six) hours as needed. 30 tablet 0  . methocarbamol (ROBAXIN) 500 MG tablet Take 1 tablet (500 mg total) by mouth every 8 (eight) hours as needed for muscle spasms. 7 tablet 0  . pantoprazole (PROTONIX) 40 MG tablet Take 1 tablet (40 mg total) by mouth daily. 30 tablet 0  . valsartan-hydrochlorothiazide (DIOVAN HCT) 160-25 MG per tablet Take 1 tablet by mouth daily. 30 tablet 11  . [DISCONTINUED] chlorthalidone (HYGROTON) 25 MG tablet Take 25 mg by mouth daily.     No current facility-administered medications on file prior to visit.   No Known Allergies  Review of Systems  Constitutional: Negative for fatigue.  HENT: Positive for congestion and sinus pressure.   Respiratory: Positive for cough. Negative for shortness of breath.   Genitourinary: Negative for dysuria.  Musculoskeletal: Positive for back pain. Negative for arthralgias.      Objective:   Physical Exam  Constitutional: He is oriented to person, place, and time. He appears well-developed and well-nourished. No distress.  HENT:  Head: Normocephalic and atraumatic.  Eyes: Conjunctivae and EOM are normal.  Neck: Neck supple.  Cardiovascular: Normal rate.   Pulmonary/Chest: Effort normal. No respiratory distress.  Musculoskeletal: Normal range of motion.  Tenderness to palpation over the L iliac crest but his hip has  a good ROM. No pain with straight leg raise on the L to 90 degrees.   Neurological: He is alert and oriented to person, place, and time.  Skin: Skin is warm and dry.  Psychiatric: He has a normal mood and affect. His behavior is normal.  Nursing note and vitals reviewed.     Assessment & Plan:   Meds ordered this encounter  Medications  . cyclobenzaprine (FLEXERIL) 10 MG tablet    Sig: Take 1 tablet (10 mg total) by mouth at bedtime.    Dispense:  30 tablet    Refill:  0  . meloxicam (MOBIC) 15 MG tablet    Sig: Take 1 tablet (15 mg total) by  mouth daily.    Dispense:  30 tablet    Refill:  0  . amoxicillin (AMOXIL) 500 MG capsule    Sig: Take 2 capsules (1,000 mg total) by mouth 2 (two) times daily.    Dispense:  40 capsule    Refill:  0  . chlorpheniramine-HYDROcodone (TUSSIONEX PENNKINETIC ER) 10-8 MG/5ML SUER    Sig: Take 5 mLs by mouth 2 (two) times daily.    Dispense:  60 mL    Refill:  0   I have completed the patient encounter in its entirety as documented by the scribe, with editing by me where necessary. Robert P. Laney Pastor, M.D.

## 2015-07-26 ENCOUNTER — Ambulatory Visit: Payer: Medicare Other | Attending: Internal Medicine

## 2015-09-07 ENCOUNTER — Ambulatory Visit: Payer: Medicare Other | Admitting: Internal Medicine

## 2015-09-10 ENCOUNTER — Ambulatory Visit: Payer: Medicare Other | Admitting: Adult Health

## 2015-09-13 ENCOUNTER — Ambulatory Visit (INDEPENDENT_AMBULATORY_CARE_PROVIDER_SITE_OTHER): Payer: Medicare Other | Admitting: Emergency Medicine

## 2015-09-13 ENCOUNTER — Ambulatory Visit (INDEPENDENT_AMBULATORY_CARE_PROVIDER_SITE_OTHER): Payer: Medicare Other

## 2015-09-13 VITALS — BP 102/58 | HR 58 | Temp 97.4°F | Resp 16 | Ht 69.5 in | Wt 201.6 lb

## 2015-09-13 DIAGNOSIS — J3489 Other specified disorders of nose and nasal sinuses: Secondary | ICD-10-CM | POA: Diagnosis not present

## 2015-09-13 DIAGNOSIS — R059 Cough, unspecified: Secondary | ICD-10-CM

## 2015-09-13 DIAGNOSIS — R079 Chest pain, unspecified: Secondary | ICD-10-CM

## 2015-09-13 DIAGNOSIS — J209 Acute bronchitis, unspecified: Secondary | ICD-10-CM | POA: Diagnosis not present

## 2015-09-13 DIAGNOSIS — R05 Cough: Secondary | ICD-10-CM

## 2015-09-13 DIAGNOSIS — I517 Cardiomegaly: Secondary | ICD-10-CM

## 2015-09-13 DIAGNOSIS — R0789 Other chest pain: Secondary | ICD-10-CM

## 2015-09-13 LAB — POCT CBC
GRANULOCYTE PERCENT: 40.4 % (ref 37–80)
HCT, POC: 46.3 % (ref 43.5–53.7)
Hemoglobin: 16.1 g/dL (ref 14.1–18.1)
Lymph, poc: 2.9 (ref 0.6–3.4)
MCH: 32.7 pg — AB (ref 27–31.2)
MCHC: 34.7 g/dL (ref 31.8–35.4)
MCV: 94 fL (ref 80–97)
MID (CBC): 0.4 (ref 0–0.9)
MPV: 5.6 fL (ref 0–99.8)
PLATELET COUNT, POC: 255 10*3/uL (ref 142–424)
POC Granulocyte: 2.2 (ref 2–6.9)
POC LYMPH %: 52.9 % — AB (ref 10–50)
POC MID %: 6.7 %M (ref 0–12)
RBC: 4.92 M/uL (ref 4.69–6.13)
RDW, POC: 15 %
WBC: 5.5 10*3/uL (ref 4.6–10.2)

## 2015-09-13 MED ORDER — DOXYCYCLINE HYCLATE 100 MG PO TABS: 100.0000 mg | ORAL_TABLET | Freq: Two times a day (BID) | ORAL | Status: DC

## 2015-09-13 NOTE — Progress Notes (Signed)
Subjective:  This chart was scribed for Arlyss Queen MD, by [name], at [facility].  This patient was seen in room 1 and the patient's care was started at 8:20 AM.   Chief Complaint  Patient presents with  . Chest Pain    x 5 days  . Back Pain    x 5 days  . Shoulder Pain    x 5 days  . Cough    Productive, Yellow  . Nasal Congestion     Patient ID: Andre [name], male    DOB: 03/07/1941, 75 y.o.   MRN: [id number]  HPI HPI Comments: Andre [name] is a 75 y.o. male who presents to the [facility] complaining of a headache and "pinching pain" that radiates to his chest, back and shoulder pain onset five days ago. Patients shoulder and back pain does not worsen with movement and describes it as a "moving" pain that goes from place to place.  He is also complaining of a mild productive cough with nasal congestion onset about a week ago.  He has a "burning sensation" in his chest when he takes deep breaths and states that his cough bothers his mostly when he lays down at night. Denies a fever. Patient lives alone.   Patient has a lung specialist and has a history of atypical chest pain and COPD.  Chest Xray in December was normal. EKG in November: T wave inversion in the V, V2 but otherwise normal.  He had an office visit to the cardiologist 1 year ago. He had a normal cath in 2016. No CHF, echo normal, no coronary artery disease present. Patient was cleared from cardiology.   Patient Active Problem List   Diagnosis Date Noted  . Erectile dysfunction 02/25/2015  . Atypical chest pain 02/25/2015  . Cigarette smoker 02/10/2015  . COPD GOLD 0 11/08/2014  . Upper airway cough syndrome 08/10/2014  . Dyspnea 07/08/2014  . Hemorrhoids 02/11/2014  . Constipation 05/30/2013  . 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 Headache 10/23/2011  . 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[place]) 10/23/2011  . GERD (gastroesophageal reflux disease) 10/23/2011  . Nicotine addiction  10/23/2011  . 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 Cervical stenosis of spinal canal 10/23/2011  . Essential hypertension, benign 07/10/2011   Past Medical History  Diagnosis Date  . Arthritis   . Hypertension   . 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[place])   . Weakness   . HA (headache)   . TIA (transient ischemic attack)   . Chest pain   . GERD (gastroesophageal reflux disease)    Past Surgical History  Procedure Laterality Date  . Surgical excision of rectal cancer     No Known Allergies Prior to Admission medications   Medication Sig Start Date End Date Taking? Authorizing Provider  aspirin EC 81 MG tablet Take 81 mg by mouth daily.    Yes Historical Provider, MD  pantoprazole (PROTONIX) 40 MG tablet Take 1 tablet (40 mg total) by mouth daily. 04/20/15 04/19/16 Yes Wendie Agreste, MD  valsartan-hydrochlorothiazide (DIOVAN HCT) 160-25 MG per tablet Take 1 tablet by mouth daily. 11/06/14  Yes Tanda Rockers, MD  acetaminophen (TYLENOL) 500 MG tablet Take 1,000 mg by mouth every 6 (six) hours as needed for headache. Reported on 09/13/2015    Historical Provider, MD  chlorpheniramine-HYDROcodone (TUSSIONEX PENNKINETIC ER) 10-8 MG/5ML SUER Take 5 mLs by mouth 2 (two) times daily. Patient not taking: Reported on 09/13/2015 07/16/15   Herbie Baltimore  Burtis Junes, MD  cyclobenzaprine (FLEXERIL) 10 MG tablet Take 1 tablet (10 mg total) by mouth at bedtime. Patient not taking: Reported on 09/13/2015 07/16/15   Leandrew Koyanagi, MD  HYDROcodone-acetaminophen (NORCO/VICODIN) 5-325 MG tablet Take 1-2 tablets by mouth every 6 (six) hours as needed for severe pain. Patient not taking: Reported on 09/13/2015 07/11/15   Leonard Schwartz, MD  ibuprofen (ADVIL,MOTRIN) 600 MG tablet Take 1 tablet (600 mg total) by mouth every 6 (six) hours as needed. Patient not taking: Reported on 09/13/2015 07/11/15   Leonard Schwartz, MD  meloxicam (MOBIC) 15 MG tablet Take 1 tablet (15 mg total) by mouth daily. Patient not taking: Reported on 09/13/2015  07/16/15   Leandrew Koyanagi, MD  methocarbamol (ROBAXIN) 500 MG tablet Take 1 tablet (500 mg total) by mouth every 8 (eight) hours as needed for muscle spasms. Patient not taking: Reported on 09/13/2015 07/10/15   Heriberto Antigua, MD  predniSONE (DELTASONE) 20 MG tablet 3/2/1 single daily dose for 3 days Patient not taking: Reported on 09/13/2015 07/16/15   Leandrew Koyanagi, MD   Social History   Social History  . Marital Status: Single    Spouse Name: N/A  . Number of Children: 11  . Years of Education: 12   Occupational History  .      retired   Social History Main Topics  . Smoking status: Former Smoker -- 0.30 packs/day for 47 years    Types: Cigarettes    Quit date: 02/26/2015  . Smokeless tobacco: Never Used  . Alcohol Use: 0.0 oz/week    0 Standard drinks or equivalent per week  . Drug Use: No  . Sexual Activity: Not on file   Other Topics Concern  . Not on file   Social History Narrative   Patient is single, retired   Patient is right handed   Education level is high school   Caffeine consumption is 1 cup daily    Review of Systems  Constitutional: Negative for fever and chills.  HENT: Positive for congestion.   Eyes: Negative for pain, redness and itching.  Respiratory: Positive for cough. Negative for choking and shortness of breath.   Gastrointestinal: Negative for nausea and vomiting.  Musculoskeletal: Positive for myalgias and back pain.  Skin: Negative for color change.  Neurological: Positive for headaches. Negative for syncope and speech difficulty.       Objective:   Physical Exam  Filed Vitals:   09/13/15 0809  BP: 102/58  Pulse: 58  Temp: 97.4 F (36.3 C)  TempSrc: Oral  Resp: 16  Height: 5' 9.5" (1.765 m)  Weight: 201 lb 9.6 oz (91.445 kg)  SpO2: 98%   CONSTITUTIONAL: alert cooperative not ill appearing HEAD: Normocephalic/atraumatic EYES: EOMI/PERRL ENMT: Mucous membranes moist NECK: supple no meningeal signs SPINE/BACK: tender on  the right parascapular muscle and adjacent to the thoracic spine on the right  CV: S1/S2 noted, no murmurs/rubs/gallops noted LUNGS:occasional rhonchi on the right. NEURO: Pt is awake/alert/appropriate, moves all extremitiesx4.  No facial droop.   EXTREMITIES: pulses normal/equal, full ROM SKIN: warm, color normal PSYCH: no abnormalities of mood noted, alert and oriented to situation Results for orders placed or performed in visit on 09/13/15  POCT CBC  Result Value Ref Range   WBC 5.5 4.6 - 10.2 K/uL   Lymph, poc 2.9 0.6 - 3.4   POC LYMPH PERCENT 52.9 (A) 10 - 50 %L   MID (cbc) 0.4 0 - 0.9   POC MID %  6.7 0 - 12 %M   POC Granulocyte 2.2 2 - 6.9   Granulocyte percent 40.4 37 - 80 %G   RBC 4.92 4.69 - 6.13 M/uL   Hemoglobin 16.1 14.1 - 18.1 g/dL   HCT, POC 46.3 43.5 - 53.7 %   MCV 94.0 80 - 97 fL   MCH, POC 32.7 (A) 27 - 31.2 pg   MCHC 34.7 31.8 - 35.4 g/dL   RDW, POC 15.0 %   Platelet Count, POC 255 142 - 424 K/uL   MPV 5.6 0 - 99.8 fL   Dg Sinus 1-2 Views  09/13/2015  CLINICAL DATA:  Sinus pain and pressure. EXAM: PARANASAL SINUSES - 1-2 VIEW COMPARISON:  MRI 08/29/2013. FINDINGS: Paranasal sinuses clear. Orbits intact. Mastoids are clear. No focal bony abnormality. IMPRESSION: Negative exam. Electronically Signed   By: Marcello Moores  Register   On: 09/13/2015 08:47   Dg Chest 2 View  09/13/2015  CLINICAL DATA:  Cough and congestion. EXAM: CHEST  2 VIEW COMPARISON:  06/03/2015.  02/22/2015.  07/15/2014. FINDINGS: Mediastinum hilar structures are normal. Mild right base atelectasis and/or scarring again noted. Cardiomegaly with normal pulmonary vascularity. No pleural effusion or pneumothorax. Degenerative changes thoracic spine . IMPRESSION: 1. Mild right base subsegmental atelectasis and/or scarring again noted. No acute pulmonary disease. 2. Cardiomegaly with normal pulmonary vascularity. Electronically Signed   By: Marcello Moores  Register   On: 09/13/2015 08:46       Assessment & Plan:    Patient will be on Mucinex and doxycycline. He does have mild cardiomegaly on chest x-ray. He had cardiovascular evaluation 1 year ago through Dr. Elmarie Shiley office. Repeat referral made to their office. His examination would say that his discomfort on the right side is all musculoskeletal. He is very tender in the right periscapular muscles and right posterior thoracic area. He can try heat and massage to this area.I personally performed the services described in this documentation, which was scribed in my presence. The recorded information has been reviewed and is accurate. He did have some mild right basilar atelectasis on chest x-ray.  Darlyne Russian, MD

## 2015-09-13 NOTE — Patient Instructions (Addendum)
   IF you received an x-ray today, you will receive an invoice from [facility]. Please contact [facility] at [phone] with questions or concerns regarding your invoice.   IF you received labwork today, you will receive an invoice from [facility]. Please contact [facility] at [phone] with questions or concerns regarding your invoice.   Our billing staff will not be able to assist you with questions regarding bills from these companies.  You will be contacted with the lab results as soon as they are available. The fastest way to get your results is to activate your My Chart account. Instructions are located on the last page of this paperwork. If you have not heard from us regarding the results in 2 weeks, please contact this office.    Acute Bronchitis Bronchitis is inflammation of the airways that extend from the windpipe into the lungs (bronchi). The inflammation often causes mucus to develop. This leads to a cough, which is the most common symptom of bronchitis.  In acute bronchitis, the condition usually develops suddenly and goes away over time, usually in a couple weeks. Smoking, allergies, and asthma can make bronchitis worse. Repeated episodes of bronchitis may cause further lung problems.  CAUSES Acute bronchitis is most often caused by the same virus that causes a cold. The virus can spread from person to person (contagious) through coughing, sneezing, and touching contaminated objects. SIGNS AND SYMPTOMS   Cough.   Fever.   Coughing up mucus.   Body aches.   Chest congestion.   Chills.   Shortness of breath.   Sore throat.  DIAGNOSIS  Acute bronchitis is usually diagnosed through a physical exam. Your health care provider will also ask you questions about your medical history. Tests, such as chest X-rays, are sometimes done to rule out other conditions.  TREATMENT  Acute bronchitis usually goes away in a  couple weeks. Oftentimes, no medical treatment is necessary. Medicines are sometimes given for relief of fever or cough. Antibiotic medicines are usually not needed but may be prescribed in certain situations. In some cases, an inhaler may be recommended to help reduce shortness of breath and control the cough. A cool mist vaporizer may also be used to help thin bronchial secretions and make it easier to clear the chest.  HOME CARE INSTRUCTIONS  Get plenty of rest.   Drink enough fluids to keep your urine clear or pale yellow (unless you have a medical condition that requires fluid restriction). Increasing fluids may help thin your respiratory secretions (sputum) and reduce chest congestion, and it will prevent dehydration.   Take medicines only as directed by your health care provider.  If you were prescribed an antibiotic medicine, finish it all even if you start to feel better.  Avoid smoking and secondhand smoke. Exposure to cigarette smoke or irritating chemicals will make bronchitis worse. If you are a smoker, consider using nicotine gum or skin patches to help control withdrawal symptoms. Quitting smoking will help your lungs heal faster.   Reduce the chances of another bout of acute bronchitis by washing your hands frequently, avoiding people with cold symptoms, and trying not to touch your hands to your mouth, nose, or eyes.   Keep all follow-up visits as directed by your health care provider.  SEEK MEDICAL CARE IF: Your symptoms do not improve after 1 week of treatment.  SEEK IMMEDIATE MEDICAL CARE IF:  You develop an increased fever or chills.   You have chest pain.     You have severe shortness of breath.  You have bloody sputum.   You develop dehydration.  You faint or repeatedly feel like you are going to pass out.  You develop repeated vomiting.  You develop a severe headache. MAKE SURE YOU:   Understand these instructions.  Will watch your  condition.  Will get help right away if you are not doing well or get worse.   This information is not intended to replace advice given to you by your health care provider. Make sure you discuss any questions you have with your health care provider.   Document Released: 07/06/2004 Document Revised: 06/19/2014 Document Reviewed: 11/19/2012 Elsevier Interactive Patient Education 2016 [employer]  

## 2015-09-30 ENCOUNTER — Ambulatory Visit (INDEPENDENT_AMBULATORY_CARE_PROVIDER_SITE_OTHER): Payer: Medicare Other | Admitting: Cardiology

## 2015-09-30 ENCOUNTER — Encounter: Payer: Self-pay | Admitting: Cardiology

## 2015-09-30 VITALS — BP 108/66 | HR 68 | Ht 71.0 in | Wt 197.0 lb

## 2015-09-30 DIAGNOSIS — I10 Essential (primary) hypertension: Secondary | ICD-10-CM | POA: Diagnosis not present

## 2015-09-30 DIAGNOSIS — R0789 Other chest pain: Secondary | ICD-10-CM

## 2015-09-30 DIAGNOSIS — F17210 Nicotine dependence, cigarettes, uncomplicated: Secondary | ICD-10-CM

## 2015-09-30 DIAGNOSIS — R079 Chest pain, unspecified: Secondary | ICD-10-CM | POA: Diagnosis not present

## 2015-09-30 DIAGNOSIS — Z720 Tobacco use: Secondary | ICD-10-CM | POA: Diagnosis not present

## 2015-09-30 NOTE — Assessment & Plan Note (Addendum)
75 year old man with hypertension, mild uncal obesity and chronic smoking with symptoms that are somewhat atypical for angina, but are persistent and recurrent.  Plan: Exercise Myoview Stress Test; he indicated that if this test were to be abnormal, he will be fine potentially going directly to cardiac catheterization for definitive evaluation. We discussed options of stress test versus cardiac catheterization both the alternatives and indications of both.  If necessary would be agreeable to cardiac catheterization. He is on standing dose of aspirin, not currently on statin  Procedure: Left Heart Catheterization with Coronary Angiography and Possible Percutaneous Coronary Intervention  The procedure with Risks/Benefits/Alternatives and Indications was reviewed with the patient.  All questions were answered.    Risks / Complications include, but not limited to: Death, MI, CVA/TIA, VF/VT (with defibrillation), Bradycardia (need for temporary pacer placement), contrast induced nephropathy, bleeding / bruising / hematoma / pseudoaneurysm, vascular or coronary injury (with possible emergent CT or Vascular Surgery), adverse medication reactions, infection.  Additional risks involving the use of radiation with the possibility of radiation burns and cancer were explained in detail.  The patient voice understanding and agree to proceed.

## 2015-09-30 NOTE — Assessment & Plan Note (Signed)
Relatively well-controlled on combination [id number].

## 2015-09-30 NOTE — Patient Instructions (Addendum)
Your physician recommends that you schedule a follow-up appointment in: 1 Month  Your physician has requested that you have en exercise stress myoview. For further information please visit [email or web address]. Please follow instruction sheet, as given.

## 2015-09-30 NOTE — Assessment & Plan Note (Signed)
He has had several episodes of intermittent chest pain evaluated since 2013. Most likely these symptoms are musculoskeletal or GI in nature. We are going to evaluate get again for coronary ischemia. 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 Revealed in the past so were less likely to miss a false negative stress test.  We talked about trying to use over-the-counter PPI/H2 blockers.

## 2015-09-30 NOTE — Assessment & Plan Note (Signed)
Smoking cessation instruction/counseling given:  counseled patient on the dangers of tobacco use, advised patient to stop smoking, and reviewed strategies to maximize success 

## 2015-09-30 NOTE — Progress Notes (Signed)
PATIENT: Andre [name] MRN: [id number] DOB: 05/14/1941 PCP: Ruben Reason, MD  Clinic Note: Chief Complaint  Patient presents with  . New Evaluation    Reestablishing cardiology care  . Chest Pain  . Headache    HPI: Andre [name] is a 75 y.o. male with a PMH below who presents today for Reevaluation by cardiology for chest discomfort. He was previously seen in the past by Dr. Burt Knack, and then Dr. Johnsie Cancel. Was last seen by Dr. Johnsie Cancel in January 2016 in the hospital follow-up, and referred to pulmonary medicine.Marland Kitchen He is essentially had negative cardiac evaluations in the past. He is a smoker with hypertension. 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 He apparently had continued chest pain is 5 and normal exercise echocardiogram stress test in 2013 and was followed by cardiac catheterization showing normal coronary arteries. He suddenly was then evaluated for chest pain again in March 2015 Myoview stress test was negative for ischemia, with normal EF.Marland Kitchen  He is a patient of Dr. Linna Darner, but is referred by Dr. Everlene Farrier for evaluation of chest pain.  Interval History: Andre [name] presents today with a confusing symptom of chest discomfort. Basically he says he has what sounds like migrating chest pain. Would start on one side and migration of the side. Currently he notes pain on the right side of his chest. At worst this is a 4-10. He says sometimes when right side, then will go up underneath the left breast and then back up to the left parasternal area. He says he thinks it may get worse with activity, but is not 100% sure. Usually this happened at rest, and he has not tried to walk with it. She says at worst the pain is 4/10, sort of a burning sensation. It sometimes seems to be associated with shortness of breath, not always. He has tried taking some Alka-Seltzer for with some benefit.  Otherwise, he denies any significant exertional dyspnea, PND, orthopnea or edema.  The remainder of cardiac review of  systems is as follows: Cardiovascular ROS: positive for - chest pain and See description above negative for - dyspnea on exertion, edema, irregular heartbeat, loss of consciousness, murmur, orthopnea, palpitations, paroxysmal nocturnal dyspnea, rapid heart rate, shortness of breath or Syncope/near syncope, TIA / amaurosis fugax  :  Past Medical History  Diagnosis Date  . Arthritis   . Hypertension   . 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[place])   . Weakness   . HA (headache)   . TIA (transient ischemic attack)   . Chest pain   . GERD (gastroesophageal reflux disease)   . Coronary artery disease, non-occlusive January 2013    Cardiac cath for abnormal exercise echo: Only 30-40% proximal RCA. Otherwise normal.    Prior Cardiac Evaluation and Past Surgical History: Past Surgical History  Procedure Laterality Date  . Surgical excision of rectal cancer    . Cardiac catheterization  January 2013    Proximal RCA 30-40%. Otherwise normal.  . Nm myoview ltd  March 2015    LOW RISK. No scar or ischemia. Normal EF (55%). No RWMA    No Known Allergies  Current Outpatient Prescriptions  Medication Sig Dispense Refill  . acetaminophen (TYLENOL) 500 MG tablet Take 1,000 mg by mouth every 6 (six) hours as needed for headache. Reported on 09/13/2015    . aspirin EC 81 MG tablet Take 81 mg by mouth daily.     . valsartan-hydrochlorothiazide (DIOVAN HCT) 160-25 MG per tablet Take 1 tablet by mouth daily. 30 tablet 11  . [  DISCONTINUED] chlorthalidone (HYGROTON) 25 MG tablet Take 25 mg by mouth daily.     No current facility-administered medications for this visit.   Social History   Social History  . Marital Status: Single    Spouse Name: N/A  . Number of Children: 11  . Years of Education: 12   Occupational History  .      retired   Social History Main Topics  . Smoking status: Former Smoker -- 0.30 packs/day for 47 years    Types: Cigarettes    Quit date: 02/26/2015  . Smokeless tobacco: Never Used  .  Alcohol Use: 0.0 oz/week    0 Standard drinks or equivalent per week  . Drug Use: No  . Sexual Activity: Not Asked   Other Topics Concern  . None   Social History Narrative   Patient is single, retired   Patient is right handed   Education level is high school   Caffeine consumption is 1 cup daily   Family History family history includes Cancer in his mother; Hypertension in his mother; Liver disease in his father.    ROS: A comprehensive Review of Systems - was perfromed Review of Systems  Constitutional: Negative for fever, chills and malaise/fatigue.  HENT: Negative for congestion and nosebleeds.   Respiratory: Positive for cough (decreased AM cough -- when wakes up in AM or @ night -  brins up mucous) and sputum production (just white mucous). Negative for shortness of breath (not routinely) and wheezing.   Gastrointestinal: Negative for heartburn, nausea, abdominal pain, diarrhea, constipation (tries to keep regular -- weekly laxative), blood in stool and melena.  Genitourinary: Positive for frequency (more frequently, weak stream). Negative for dysuria and hematuria.  Musculoskeletal: Negative for myalgias, back pain, joint pain and falls.  Neurological: Positive for headaches (~daily.  takes Tylenol). Negative for dizziness, focal weakness, seizures, loss of consciousness and weakness.  Endo/Heme/Allergies: Does not bruise/bleed easily.  Psychiatric/Behavioral: Negative for depression and memory loss. The patient is not nervous/anxious and does not have insomnia.   All other systems reviewed and are negative.   PHYSICAL EXAM BP 108/66 mmHg  Pulse 68  Ht '5\' 11"'$  (1.803 m)  Wt 197 lb (89.359 kg)  BMI 27.49 kg/m2 General appearance: alert, cooperative, appears stated age, no distress, mildly obese and Otherwise well-nourished and well-groomed. Pleasant mood and affect. Neck: no adenopathy, no carotid bruit, no JVD, supple, symmetrical, trachea midline and thyroid not  enlarged, symmetric, no tenderness/mass/nodules Lungs: clear to auscultation bilaterally, normal percussion bilaterally and Nonlabored, good air movement. Heart: regular rate and rhythm, S1, S2 normal, no murmur, click, rub or gallop and normal apical impulse Abdomen: soft, non-tender; bowel sounds normal; no masses,  no organomegaly and Mild truncal obesity. He does have a ventral hernia/diastases recti. Extremities: extremities normal, atraumatic, no cyanosis or edema and no ulcers, gangrene or trophic changes Pulses: 2+ and symmetric Skin: Skin color, texture, turgor normal. No rashes or lesions Neurologic: Alert and oriented X 3, normal strength and tone. Normal symmetric reflexes. Normal coordination and gait Cranial nerves: normal    Adult ECG Report  Rate: 68 ;  Rhythm: normal sinus rhythm and 1 AV block. Nonspecific ST and T-wave changes.  QRS Axis: -13 ;  PR Interval: 202 ;  QRS Duration: 98 ; QTc: 440;  Voltages: Normal  Conduction Disturbances: first-degree A-V block   Other Abnormalities: Nonspecific ST and T wave changes with flat ST segments and LDL 82  Narrative Interpretation: Stable EKG.  Recent  Labs:  Lab Results  Component Value Date   CHOL 143 08/22/2013   HDL 37* 08/22/2013   LDLCALC 83 08/22/2013   TRIG 114 08/22/2013   CHOLHDL 3.9 08/22/2013    ASSESSMENT / PLAN: Severe-year-old gentleman with cardiac risk factors of smoking and hypertension as well as obesity. He has had several evaluations of vascular cardiac history and now presents with consider relatively atypical in nature, but still are somewhat concerning for possible angina given his baseline risk characteristics. At least moderate risk for cardiac etiology..  Problem List Items Addressed This Visit    Essential hypertension, benign (Chronic)    Relatively well-controlled on combination ARB-HCTZ.      Cigarette smoker    Smoking cessation instruction/counseling given:  counseled patient on the  dangers of tobacco use, advised patient to stop smoking, and reviewed strategies to maximize success      Chest pain with moderate risk for cardiac etiology - Primary    75 year old man with hypertension, mild uncal obesity and chronic smoking with symptoms that are somewhat atypical for angina, but are persistent and recurrent.  Plan: Exercise Myoview Stress Test; he indicated that if this test were to be abnormal, he will be fine potentially going directly to cardiac catheterization for definitive evaluation. We discussed options of stress test versus cardiac catheterization both the alternatives and indications of both.  If necessary would be agreeable to cardiac catheterization. He is on standing dose of aspirin, not currently on statin  Procedure: Left Heart Catheterization with Coronary Angiography and Possible Percutaneous Coronary Intervention  The procedure with Risks/Benefits/Alternatives and Indications was reviewed with the patient.  All questions were answered.    Risks / Complications include, but not limited to: Death, MI, CVA/TIA, VF/VT (with defibrillation), Bradycardia (need for temporary pacer placement), contrast induced nephropathy, bleeding / bruising / hematoma / pseudoaneurysm, vascular or coronary injury (with possible emergent CT or Vascular Surgery), adverse medication reactions, infection.  Additional risks involving the use of radiation with the possibility of radiation burns and cancer were explained in detail.  The patient voice understanding and agree to proceed.           Relevant Orders   EKG 12-Lead   Myocardial Perfusion Imaging   Atypical chest pain (Chronic)    He has had several episodes of intermittent chest pain evaluated since 2013. Most likely these symptoms are musculoskeletal or GI in nature. We are going to evaluate get again for coronary ischemia. 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 Revealed in the past so were less likely to miss  a false negative stress test.  We talked about trying to use over-the-counter PPI/H2 blockers.         No orders of the defined types were placed in this encounter.    Followup: Roughly 1 month    Meesha Sek, Leonie Green, M.D., M.S. Interventional Cardiologist   Pager # [phone] Phone # [phone] [address] [place], [place] [address]

## 2015-10-13 ENCOUNTER — Telehealth (HOSPITAL_COMMUNITY): Payer: Self-pay

## 2015-10-13 NOTE — Telephone Encounter (Signed)
Encounter complete. 

## 2015-10-15 ENCOUNTER — Ambulatory Visit (HOSPITAL_COMMUNITY)
Admission: RE | Admit: 2015-10-15 | Discharge: 2015-10-15 | Disposition: A | Discharge status: Home / Self Care | Payer: Medicare Other | Source: Ambulatory Visit | Attending: Cardiovascular Disease | Admitting: Cardiovascular Disease

## 2015-10-15 DIAGNOSIS — Z720 Tobacco use: Secondary | ICD-10-CM | POA: Diagnosis not present

## 2015-10-15 DIAGNOSIS — R079 Chest pain, unspecified: Secondary | ICD-10-CM | POA: Diagnosis not present

## 2015-10-15 DIAGNOSIS — R0609 Other forms of dyspnea: Secondary | ICD-10-CM | POA: Diagnosis not present

## 2015-10-15 DIAGNOSIS — R42 Dizziness and giddiness: Secondary | ICD-10-CM | POA: Insufficient documentation

## 2015-10-15 DIAGNOSIS — R0602 Shortness of breath: Secondary | ICD-10-CM | POA: Diagnosis not present

## 2015-10-15 DIAGNOSIS — I10 Essential (primary) hypertension: Secondary | ICD-10-CM | POA: Diagnosis not present

## 2015-10-15 DIAGNOSIS — R9439 Abnormal result of other cardiovascular function study: Secondary | ICD-10-CM | POA: Insufficient documentation

## 2015-10-15 LAB — MYOCARDIAL PERFUSION IMAGING
CHL CUP MPHR: 147 {beats}/min
CHL RATE OF PERCEIVED EXERTION: 17
CSEPEW: 5.8 METS
Exercise duration (min): 4 min
LV sys vol: 68 mL
LVDIAVOL: 120 mL (ref 62–150)
Peak HR: 131 {beats}/min
Percent HR: 89 %
Rest HR: 68 {beats}/min
SDS: 1
SRS: 4
SSS: 5
TID: 1.12

## 2015-10-15 MED ORDER — TECHNETIUM TC 99M SESTAMIBI GENERIC - CARDIOLITE
10.9000 | Freq: Once | INTRAVENOUS | Status: AC | PRN
  Administered 2015-10-15: 10.9 via INTRAVENOUS

## 2015-10-15 MED ORDER — TECHNETIUM TC 99M SESTAMIBI GENERIC - CARDIOLITE
30.9000 | Freq: Once | INTRAVENOUS | Status: AC | PRN
  Administered 2015-10-15: 30.9 via INTRAVENOUS

## 2015-10-17 ENCOUNTER — Encounter (HOSPITAL_COMMUNITY): Payer: Self-pay

## 2015-10-17 ENCOUNTER — Observation Stay (HOSPITAL_COMMUNITY)
Admission: EM | Admit: 2015-10-17 | Discharge: 2015-10-18 | Disposition: A | Discharge status: Home / Self Care | Payer: Medicare Other | Attending: Internal Medicine | Admitting: Internal Medicine

## 2015-10-17 ENCOUNTER — Emergency Department (HOSPITAL_COMMUNITY): Payer: Medicare Other

## 2015-10-17 DIAGNOSIS — Z79899 Other long term (current) drug therapy: Secondary | ICD-10-CM | POA: Diagnosis not present

## 2015-10-17 DIAGNOSIS — I2510 Atherosclerotic heart disease of native coronary artery without angina pectoris: Secondary | ICD-10-CM | POA: Diagnosis not present

## 2015-10-17 DIAGNOSIS — Z85048 Personal history of other malignant neoplasm of rectum, rectosigmoid junction, and anus: Secondary | ICD-10-CM | POA: Diagnosis not present

## 2015-10-17 DIAGNOSIS — R0789 Other chest pain: Secondary | ICD-10-CM | POA: Diagnosis not present

## 2015-10-17 DIAGNOSIS — R9439 Abnormal result of other cardiovascular function study: Secondary | ICD-10-CM

## 2015-10-17 DIAGNOSIS — Z87891 Personal history of nicotine dependence: Secondary | ICD-10-CM | POA: Diagnosis not present

## 2015-10-17 DIAGNOSIS — R1010 Upper abdominal pain, unspecified: Secondary | ICD-10-CM | POA: Diagnosis not present

## 2015-10-17 DIAGNOSIS — Z8673 Personal history of transient ischemic attack (TIA), and cerebral infarction without residual deficits: Secondary | ICD-10-CM | POA: Diagnosis not present

## 2015-10-17 DIAGNOSIS — R079 Chest pain, unspecified: Secondary | ICD-10-CM | POA: Diagnosis not present

## 2015-10-17 DIAGNOSIS — I200 Unstable angina: Secondary | ICD-10-CM | POA: Diagnosis not present

## 2015-10-17 DIAGNOSIS — K219 Gastro-esophageal reflux disease without esophagitis: Secondary | ICD-10-CM | POA: Diagnosis not present

## 2015-10-17 DIAGNOSIS — I10 Essential (primary) hypertension: Secondary | ICD-10-CM | POA: Diagnosis present

## 2015-10-17 DIAGNOSIS — Z7982 Long term (current) use of aspirin: Secondary | ICD-10-CM | POA: Insufficient documentation

## 2015-10-17 DIAGNOSIS — I249 Acute ischemic heart disease, unspecified: Secondary | ICD-10-CM | POA: Diagnosis present

## 2015-10-17 LAB — BASIC METABOLIC PANEL
Anion gap: 10 (ref 5–15)
BUN: 9 mg/dL (ref 6–20)
CHLORIDE: 100 mmol/L — AB (ref 101–111)
CO2: 29 mmol/L (ref 22–32)
CREATININE: 0.94 mg/dL (ref 0.61–1.24)
Calcium: 9.1 mg/dL (ref 8.9–10.3)
GFR calc non Af Amer: >60 mL/min (ref >60)
Glucose, Bld: 116 mg/dL — ABNORMAL HIGH (ref 65–99)
POTASSIUM: 3.6 mmol/L (ref 3.5–5.1)
SODIUM: 139 mmol/L (ref 135–145)

## 2015-10-17 LAB — T4, FREE: Free T4: 0.72 ng/dL (ref 0.61–1.12)

## 2015-10-17 LAB — LIPID PANEL
Cholesterol: 160 mg/dL (ref 0–200)
HDL: 37 mg/dL — ABNORMAL LOW (ref >40)
LDL CALC: 91 mg/dL (ref 0–99)
Total CHOL/HDL Ratio: 4.3 RATIO
Triglycerides: 162 mg/dL — ABNORMAL HIGH (ref <150)
VLDL: 32 mg/dL (ref 0–40)

## 2015-10-17 LAB — HEPATIC FUNCTION PANEL
ALT: 25 U/L (ref 17–63)
AST: 33 U/L (ref 15–41)
Albumin: 3.8 g/dL (ref 3.5–5.0)
Alkaline Phosphatase: 89 U/L (ref 38–126)
BILIRUBIN INDIRECT: 0.9 mg/dL (ref 0.3–0.9)
Bilirubin, Direct: 0.2 mg/dL (ref 0.1–0.5)
TOTAL PROTEIN: 6.9 g/dL (ref 6.5–8.1)
Total Bilirubin: 1.1 mg/dL (ref 0.3–1.2)

## 2015-10-17 LAB — TROPONIN I: Troponin I: <0.03 ng/mL (ref <0.031)

## 2015-10-17 LAB — CBC
HEMATOCRIT: 48.9 % (ref 39.0–52.0)
Hemoglobin: 15.7 g/dL (ref 13.0–17.0)
MCH: 31.2 pg (ref 26.0–34.0)
MCHC: 32.1 g/dL (ref 30.0–36.0)
MCV: 97 fL (ref 78.0–100.0)
PLATELETS: 212 10*3/uL (ref 150–400)
RBC: 5.04 MIL/uL (ref 4.22–5.81)
RDW: 15 % (ref 11.5–15.5)
WBC: 6.7 10*3/uL (ref 4.0–10.5)

## 2015-10-17 LAB — I-STAT TROPONIN, ED: Troponin i, poc: 0 ng/mL (ref 0.00–0.08)

## 2015-10-17 LAB — TSH: TSH: 1.042 u[IU]/mL (ref 0.350–4.500)

## 2015-10-17 LAB — D-DIMER, QUANTITATIVE: D-Dimer, Quant: 0.33 ug/mL-FEU (ref 0.00–0.50)

## 2015-10-17 MED ORDER — ASPIRIN 81 MG PO CHEW
324.0000 mg | CHEWABLE_TABLET | Freq: Once | ORAL | Status: AC
  Administered 2015-10-17: 324 mg via ORAL
  Filled 2015-10-17: qty 4

## 2015-10-17 MED ORDER — ASPIRIN 81 MG PO CHEW
81.0000 mg | CHEWABLE_TABLET | ORAL | Status: AC
  Administered 2015-10-18: 81 mg via ORAL
  Filled 2015-10-17: qty 1

## 2015-10-17 MED ORDER — SODIUM CHLORIDE 0.9% FLUSH: 3.0000 mL | INTRAVENOUS | Status: DC | PRN

## 2015-10-17 MED ORDER — MORPHINE SULFATE (PF) 2 MG/ML IV SOLN
2.0000 mg | INTRAVENOUS | Status: DC | PRN
  Administered 2015-10-17 – 2015-10-18 (×2): 2 mg via INTRAVENOUS
  Filled 2015-10-17 (×3): qty 1

## 2015-10-17 MED ORDER — TRAMADOL HCL 50 MG PO TABS
50.0000 mg | ORAL_TABLET | Freq: Two times a day (BID) | ORAL | Status: DC | PRN
  Administered 2015-10-17 – 2015-10-18 (×2): 50 mg via ORAL
  Filled 2015-10-17 (×2): qty 1

## 2015-10-17 MED ORDER — MORPHINE SULFATE (PF) 4 MG/ML IV SOLN
4.0000 mg | Freq: Once | INTRAVENOUS | Status: AC
  Administered 2015-10-17: 4 mg via INTRAVENOUS
  Filled 2015-10-17: qty 1

## 2015-10-17 MED ORDER — ASPIRIN EC 81 MG PO TBEC: 81.0000 mg | DELAYED_RELEASE_TABLET | Freq: Every day | ORAL | Status: DC

## 2015-10-17 MED ORDER — ENOXAPARIN SODIUM 40 MG/0.4ML ~~LOC~~ SOLN
40.0000 mg | SUBCUTANEOUS | Status: DC
Start: 2015-10-17 — End: 2015-10-18
  Administered 2015-10-17: 40 mg via SUBCUTANEOUS
  Filled 2015-10-17: qty 0.4

## 2015-10-17 MED ORDER — ACETAMINOPHEN 325 MG PO TABS
650.0000 mg | ORAL_TABLET | ORAL | Status: DC | PRN
  Administered 2015-10-17 – 2015-10-18 (×2): 650 mg via ORAL
  Filled 2015-10-17 (×2): qty 2

## 2015-10-17 MED ORDER — ONDANSETRON HCL 4 MG/2ML IJ SOLN: 4.0000 mg | Freq: Four times a day (QID) | INTRAMUSCULAR | Status: DC | PRN

## 2015-10-17 MED ORDER — SODIUM CHLORIDE 0.9 % WEIGHT BASED INFUSION
1.0000 mL/kg/h | INTRAVENOUS | Status: DC
  Administered 2015-10-17: 1 mL/kg/h via INTRAVENOUS

## 2015-10-17 MED ORDER — METOPROLOL TARTRATE 25 MG PO TABS
25.0000 mg | ORAL_TABLET | Freq: Two times a day (BID) | ORAL | Status: DC
  Administered 2015-10-17 (×2): 25 mg via ORAL
  Filled 2015-10-17 (×2): qty 1

## 2015-10-17 MED ORDER — SODIUM CHLORIDE 0.9% FLUSH
3.0000 mL | Freq: Two times a day (BID) | INTRAVENOUS | Status: DC
  Administered 2015-10-17 (×2): 3 mL via INTRAVENOUS

## 2015-10-17 MED ORDER — ATORVASTATIN CALCIUM 40 MG PO TABS
40.0000 mg | ORAL_TABLET | Freq: Every day | ORAL | Status: DC
  Administered 2015-10-17: 40 mg via ORAL
  Filled 2015-10-17: qty 1

## 2015-10-17 MED ORDER — SODIUM CHLORIDE 0.9% FLUSH: 3.0000 mL | Freq: Two times a day (BID) | INTRAVENOUS | Status: DC

## 2015-10-17 MED ORDER — SODIUM CHLORIDE 0.9 % IV SOLN: 250.0000 mL | INTRAVENOUS | Status: DC | PRN

## 2015-10-17 MED ORDER — NITROGLYCERIN 0.4 MG SL SUBL
0.4000 mg | SUBLINGUAL_TABLET | SUBLINGUAL | Status: DC | PRN
  Administered 2015-10-18: 0.4 mg via SUBLINGUAL
  Filled 2015-10-17: qty 1

## 2015-10-17 MED ORDER — ONDANSETRON HCL 4 MG/2ML IJ SOLN
4.0000 mg | Freq: Once | INTRAMUSCULAR | Status: AC
  Administered 2015-10-17: 4 mg via INTRAVENOUS
  Filled 2015-10-17: qty 2

## 2015-10-17 NOTE — ED Notes (Signed)
[name] CP in L chest, non radiating, pain has been going on for about two weeks, worse today, has been seen at PCP twice. Denies n/v, SOB.

## 2015-10-17 NOTE — ED Notes (Signed)
Attempted report 

## 2015-10-17 NOTE — Progress Notes (Signed)
Patient seen and examined. He presented with chest pain. It is in the left lower rib area. It increases with inspiration and certain movements.It worsens with palpation. ECG shows nonspecific changes.Enzymes are negative. Pain likely musculoskeletal. Check Ddimer given pleuritic component; pain meds. Recent nuclear study abnormal and Dr. Ellyn Hack plan cardiac catheterization.This was discussed with patient. The risks and benefits including myocardial infarction, CVA and death discussed and he agrees to proceed.We will plan tomorrow morning.  Andre [name]

## 2015-10-17 NOTE — H&P (Signed)
[facility] History and Physical  PCP: HOPPER,DAVID, MD Cardiologist: Dr. Glenetta Hew  History of Present Illness (and review of medical records): Andre [name] is a 75 y.o. male who presents for evaluation of chest pain.  He has hx of HTN and tobacco abuse.  He had prior evaluation for chest pain with prior echo and nuclear stress.  He had Cath in 2013 with non-obstructive CAD.  He recently saw Dr. Ellyn Hack on 4/20 to re-establish care.  He had been having chest pain over the past few weeks.  He underwent nuclear stress just this Friday 5/5 with results as below.  Patient states he had been having 4-5/10 left side, sharp chest pain at rest and with exertion.  Pain usually goes away in a few minutes.  He states that he woke up this am around 3 with 10/10 chest pain.  He denied any associated symptoms.  He drove himself to the ED.  Initial evaluation reveals no acute changes on EKG with negative troponin.  He was given ASA.  He is still having chest pain in ED.  He has been ordered morphine for pain.  Review of Systems A comprehensive review of systems was negative except for: Neurological: positive for headaches Further review of systems was negative other than stated in HPI.  Nuclear stress 10/15/2015:  Nuclear stress EF is calculated at 44% but visually appears 50% with no regional wall motion abnormalities.  The left ventricular ejection fraction is moderately decreased (30-44%).  There is a medium defect of moderate severity present in the basal inferior, mid inferior and apical inferior location. The defect is non-reversible. In the setting of normal wall motion in the inferior wall, this is consistent with diaphragmatic attenuation. No ischemia noted.  There is a small defect of moderate severity present in the basal inferolateral and mid inferolateral location. The defect is reversible. There is significant extracardiac uptake of radiotracer in the area of the inferolateral wall. This  defect could represent ischemia but also can be explained by variations in extracardiac uptake between stress and rest imaging. Clinical correlation recommended.  This is an intermediate risk study.   Past Medical History  Diagnosis Date  . Arthritis   . Hypertension   . 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[place])   . Weakness   . HA (headache)   . TIA (transient ischemic attack)   . Chest pain   . GERD (gastroesophageal reflux disease)   . Coronary artery disease, non-occlusive January 2013    Cardiac cath for abnormal exercise echo: Only 30-40% proximal RCA. Otherwise normal.    Past Surgical History  Procedure Laterality Date  . Surgical excision of rectal cancer    . Cardiac catheterization  January 2013    Proximal RCA 30-40%. Otherwise normal.  . Nm myoview ltd  March 2015    LOW RISK. No scar or ischemia. Normal EF (55%). No RWMA  No current facility-administered medications for this encounter.  Current outpatient prescriptions:  .  acetaminophen (TYLENOL) 500 MG tablet, Take 1,000 mg by mouth every 6 (six) hours as needed for headache. Reported on 09/13/2015, Disp: , Rfl:  .  aspirin EC 81 MG tablet, Take 81 mg by mouth daily. , Disp: , Rfl:  .  valsartan-hydrochlorothiazide (DIOVAN HCT) 160-25 MG per tablet, Take 1 tablet by mouth daily., Disp: 30 tablet, Rfl: 11 .  [DISCONTINUED] chlorthalidone (HYGROTON) 25 MG tablet, Take 25 mg by mouth daily., Disp: , Rfl:     No Known Allergies  Social History  Substance Use Topics  . Smoking status: Former Smoker -- 0.30 packs/day for 47 years    Types: Cigarettes    Quit date: 02/26/2015  . Smokeless tobacco: Never Used  . Alcohol Use: 0.0 oz/week    0 Standard drinks or equivalent per week    Family History  Problem Relation Age of Onset  . Hypertension Mother   . Cancer Mother   . Liver disease Father      Objective:  Patient Vitals for the past 8 hrs:  BP Temp Pulse Resp SpO2 Height Weight  10/17/15 0513 172/86 mmHg 97.6 F (36.4 C)  64 17 100 % 5' 11" (1.803 m) 92.987 kg (205 lb)   General appearance: alert, cooperative, appears stated age and no distress Head: Normocephalic, without obvious abnormality, atraumatic Eyes: PERRL, EOM's intact. Neck: no carotid bruit, no JVD and supple, Lungs: clear to auscultation bilaterally Chest wall: no tenderness Heart: regular rate and rhythm, S1, S2 normal Abdomen: soft, non-tender; bowel sounds normal Extremities: extremities normal, atraumatic, no edema Pulses: 2+ and symmetric Neurologic: Grossly normal  Results for orders placed or performed during the hospital encounter of 10/17/15 (from the past 48 hour(s))  Basic metabolic panel     Status: Abnormal   Collection Time: 10/17/15  5:15 AM  Result Value Ref Range   Sodium 139 135 - 145 mmol/L   Potassium 3.6 3.5 - 5.1 mmol/L   Chloride 100 (L) 101 - 111 mmol/L   CO2 29 22 - 32 mmol/L   Glucose, Bld 116 (H) 65 - 99 mg/dL   BUN 9 6 - 20 mg/dL   Creatinine, Ser 0.94 0.61 - 1.24 mg/dL   Calcium 9.1 8.9 - 10.3 mg/dL   GFR calc non Af Amer >60 >60 mL/min   GFR calc Af Amer >60 >60 mL/min    Comment: (NOTE) The eGFR has been calculated using the CKD EPI equation. This calculation has not been validated in all clinical situations. eGFR's persistently <60 mL/min signify possible Chronic Kidney Disease.    Anion gap 10 5 - 15  CBC     Status: None   Collection Time: 10/17/15  5:15 AM  Result Value Ref Range   WBC 6.7 4.0 - 10.5 K/uL   RBC 5.04 4.22 - 5.81 MIL/uL   Hemoglobin 15.7 13.0 - 17.0 g/dL   HCT 48.9 39.0 - 52.0 %   MCV 97.0 78.0 - 100.0 fL   MCH 31.2 26.0 - 34.0 pg   MCHC 32.1 30.0 - 36.0 g/dL   RDW 15.0 11.5 - 15.5 %   Platelets 212 150 - 400 K/uL  I-stat troponin, ED     Status: None   Collection Time: 10/17/15  5:21 AM  Result Value Ref Range   Troponin i, poc 0.00 0.00 - 0.08 ng/mL   Comment 3            Comment: Due to the release kinetics of cTnI, a negative result within the first hours of the  onset of symptoms does not rule out myocardial infarction with certainty. If myocardial infarction is still suspected, repeat the test at appropriate intervals.    Dg Chest 2 View  10/17/2015  CLINICAL DATA:  Chest pain for 2 weeks, worse today. Hypertension. Previous smoker. EXAM: CHEST  2 VIEW COMPARISON:  09/13/2015 FINDINGS: Cardiac enlargement without significant vascular congestion. Scarring or atelectasis in the lung bases similar to previous study. No focal consolidation. Prominent cardiac fat pad. No blunting of costophrenic angles. No pneumothorax.  Mediastinal contours appear intact. Degenerative changes in the spine and shoulders. IMPRESSION: No active cardiopulmonary disease. Electronically Signed   By: Lucienne Capers M.D.   On: 10/17/2015 05:51    ECG:  Sinus rhythm with TWA in anterior leads, unchanged from prior ecgs reviewed.  Same as baseline ecg from stress as well.  Assessment: Chest pain, hx of non-obstructive disease with recent abnormal nuclear stress Hypertension Tobacco abuse   Plan: 1. Cardiology Admission  2. Continuous monitoring on Telemetry. 3. Repeat ekg on admit, prn chest pain or arrythmia 4. Cycle cardiac biomarkers, check lipids, hgba1c, tsh 5. Medical management to include ASA, BB, Statin, NTG prn 6. Per Dr. Allison Quarry note follow stress, likely plan for further ischemic evaluation with Cath 7. Tobacco cessation 8. DVT proph: Enox 9. Full code

## 2015-10-17 NOTE — ED Provider Notes (Signed)
CSN: [id number]     Arrival date & time 10/17/15  0501 History   First MD Initiated Contact with Patient 10/17/15 [phone]     Chief Complaint  Patient presents with  . Chest Pain     (Consider location/radiation/quality/duration/timing/severity/associated sxs/prior Treatment) HPI   Pt has PMH of arthritis, hypertension, rectal cancer, weakness, HA, TIA, CP, GERD, CAD presents with CP. He had a myocardial perfusion test done within the last few days and the results showed some abnormalities including signs of a prior MI and insufficient blood flow to certain areas of the heart. Dr. Darcus Pester plan was to schedule cardiac catheterization.  Patients states that his CP is L, does not radiate, and has been persisting for two weeks. This morning it became worse but has not had any diaphoresis, SOB, coughing, n/V/D, headache or weakness associated with it.  Past Medical History  Diagnosis Date  . Arthritis   . Hypertension   . 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[place])   . Weakness   . HA (headache)   . TIA (transient ischemic attack)   . Chest pain   . GERD (gastroesophageal reflux disease)   . Coronary artery disease, non-occlusive January 2013    Cardiac cath for abnormal exercise echo: Only 30-40% proximal RCA. Otherwise normal.   Past Surgical History  Procedure Laterality Date  . Surgical excision of rectal cancer    . Cardiac catheterization  January 2013    Proximal RCA 30-40%. Otherwise normal.  . Nm myoview ltd  March 2015    LOW RISK. No scar or ischemia. Normal EF (55%). No RWMA   Family History  Problem Relation Age of Onset  . Hypertension Mother   . Cancer Mother   . Liver disease Father    Social History  Substance Use Topics  . Smoking status: Former Smoker -- 0.30 packs/day for 47 years    Types: Cigarettes    Quit date: 02/26/2015  . Smokeless tobacco: Never Used  . Alcohol Use: 0.0 oz/week    0 Standard drinks or equivalent per week    Review of Systems  Review of Systems All  other systems negative except as documented in the HPI. All pertinent positives and negatives as reviewed in the HPI.   Allergies  Review of patient's allergies indicates no known allergies.  Home Medications   Prior to Admission medications   Medication Sig Start Date End Date Taking? Authorizing Provider  acetaminophen (TYLENOL) 500 MG tablet Take 1,000 mg by mouth every 6 (six) hours as needed for headache. Reported on 09/13/2015   Yes Historical Provider, MD  aspirin EC 81 MG tablet Take 81 mg by mouth daily.    Yes Historical Provider, MD  valsartan-hydrochlorothiazide (DIOVAN HCT) 160-25 MG per tablet Take 1 tablet by mouth daily. 11/06/14  Yes Tanda Rockers, MD   BP 172/86 mmHg  Pulse 64  Temp(Src) 97.6 F (36.4 C)  Resp 17  Ht '5\' 11"'$  (1.803 m)  Wt 92.987 kg  BMI 28.60 kg/m2  SpO2 100% Physical Exam  Constitutional: He appears well-developed and well-nourished. No distress.  HENT:  Head: Normocephalic and atraumatic.  Right Ear: Tympanic membrane and ear canal normal.  Left Ear: Tympanic membrane and ear canal normal.  Nose: Nose normal.  Mouth/Throat: Uvula is midline, oropharynx is clear and moist and mucous membranes are normal.  Eyes: Pupils are equal, round, and reactive to light.  Neck: Normal range of motion. Neck supple.  Cardiovascular: Normal rate and regular rhythm.   Pulmonary/Chest:  Effort normal and breath sounds normal. He has no decreased breath sounds. He has no wheezes. He has no rhonchi. He exhibits no tenderness and no bony tenderness.  Abdominal: Soft.  No signs of abdominal distention  Musculoskeletal:  No LE swelling  Neurological: He is alert.  Acting at baseline  Skin: Skin is warm and dry. No rash noted.  Nursing note and vitals reviewed.   ED Course  Procedures (including critical care time) Labs Review Labs Reviewed  [place], ED    Imaging Review Dg Chest 2 View  10/17/2015  CLINICAL DATA:   Chest pain for 2 weeks, worse today. Hypertension. Previous smoker. EXAM: CHEST  2 VIEW COMPARISON:  09/13/2015 FINDINGS: Cardiac enlargement without significant vascular congestion. Scarring or atelectasis in the lung bases similar to previous study. No focal consolidation. Prominent cardiac fat pad. No blunting of costophrenic angles. No pneumothorax. Mediastinal contours appear intact. Degenerative changes in the spine and shoulders. IMPRESSION: No active cardiopulmonary disease. Electronically Signed   By: Lucienne Capers M.D.   On: 10/17/2015 05:51   I have personally reviewed and evaluated these images and lab results as part of my medical decision-making.   EKG Interpretation   Date/Time:  Sunday Oct 17 2015 05:10:08 EDT Ventricular Rate:  64 PR Interval:  207 QRS Duration: 106 QT Interval:  474 QTC Calculation: 489 R Axis:   28 Text Interpretation:  Sinus rhythm Borderline repolarization abnormality  Borderline prolonged QT interval No significant change was found Confirmed  by CAMPOS  MD, KEVIN ([id number]) on 10/17/2015 5:33:45 AM     MDM   Final diagnoses:  Chest pain, unspecified chest pain type    EKG shows no significant changes, negative first Troponin,  Normal CBC and BMP. Chest xray is pending. Patient is high risk for cardiac events, will call cardiology to request evaluation. Suspect they will want to keep him for cardiac catheterization.   5: 51 am- Dr. Elvina Sidle with cardiology has agreed to consult on patient. Arlean Hopping, PA-C has agreed to follow-up on patient.  Medications  morphine 4 MG/ML injection 4 mg (not administered)  ondansetron (ZOFRAN) injection 4 mg (not administered)  aspirin chewable tablet 324 mg (not administered)       Delos Haring, PA-C 10/17/15 [place], MD 10/17/15 [phone]

## 2015-10-18 ENCOUNTER — Encounter (HOSPITAL_COMMUNITY): Payer: Self-pay | Admitting: Interventional Cardiology

## 2015-10-18 ENCOUNTER — Telehealth: Payer: Self-pay | Admitting: *Deleted

## 2015-10-18 ENCOUNTER — Encounter (HOSPITAL_COMMUNITY)
Admission: EM | Disposition: A | Discharge status: Home / Self Care | Payer: Self-pay | Source: Home / Self Care | Attending: Emergency Medicine

## 2015-10-18 DIAGNOSIS — I25110 Atherosclerotic heart disease of native coronary artery with unstable angina pectoris: Secondary | ICD-10-CM

## 2015-10-18 DIAGNOSIS — R079 Chest pain, unspecified: Secondary | ICD-10-CM

## 2015-10-18 DIAGNOSIS — I200 Unstable angina: Secondary | ICD-10-CM

## 2015-10-18 DIAGNOSIS — I10 Essential (primary) hypertension: Secondary | ICD-10-CM | POA: Diagnosis not present

## 2015-10-18 DIAGNOSIS — R9439 Abnormal result of other cardiovascular function study: Secondary | ICD-10-CM | POA: Diagnosis not present

## 2015-10-18 DIAGNOSIS — I2510 Atherosclerotic heart disease of native coronary artery without angina pectoris: Secondary | ICD-10-CM | POA: Diagnosis not present

## 2015-10-18 HISTORY — PX: CARDIAC CATHETERIZATION: SHX172

## 2015-10-18 LAB — BASIC METABOLIC PANEL
ANION GAP: 10 (ref 5–15)
BUN: 6 mg/dL (ref 6–20)
CALCIUM: 8.6 mg/dL — AB (ref 8.9–10.3)
CO2: 24 mmol/L (ref 22–32)
Chloride: 102 mmol/L (ref 101–111)
Creatinine, Ser: 0.75 mg/dL (ref 0.61–1.24)
GLUCOSE: 116 mg/dL — AB (ref 65–99)
POTASSIUM: 3.6 mmol/L (ref 3.5–5.1)
Sodium: 136 mmol/L (ref 135–145)

## 2015-10-18 LAB — CBC
HEMATOCRIT: 46 % (ref 39.0–52.0)
HEMATOCRIT: 45.5 % (ref 39.0–52.0)
HEMOGLOBIN: 14.5 g/dL (ref 13.0–17.0)
Hemoglobin: 14.7 g/dL (ref 13.0–17.0)
MCH: 31.5 pg (ref 26.0–34.0)
MCH: 32.2 pg (ref 26.0–34.0)
MCHC: 31.5 g/dL (ref 30.0–36.0)
MCHC: 32.3 g/dL (ref 30.0–36.0)
MCV: 99.8 fL (ref 78.0–100.0)
MCV: 99.8 fL (ref 78.0–100.0)
PLATELETS: 210 10*3/uL (ref 150–400)
Platelets: 226 10*3/uL (ref 150–400)
RBC: 4.61 MIL/uL (ref 4.22–5.81)
RBC: 4.56 MIL/uL (ref 4.22–5.81)
RDW: 14.8 % (ref 11.5–15.5)
RDW: 14.7 % (ref 11.5–15.5)
WBC: 6.5 10*3/uL (ref 4.0–10.5)
WBC: 7.3 10*3/uL (ref 4.0–10.5)

## 2015-10-18 LAB — CREATININE, SERUM
Creatinine, Ser: 0.85 mg/dL (ref 0.61–1.24)
GFR calc Af Amer: >60 mL/min (ref >60)
GFR calc non Af Amer: >60 mL/min (ref >60)

## 2015-10-18 LAB — PROTIME-INR
INR: 1.09 (ref 0.00–1.49)
PROTHROMBIN TIME: 14.3 s (ref 11.6–15.2)

## 2015-10-18 LAB — HEMOGLOBIN A1C
Hgb A1c MFr Bld: 7.1 % — ABNORMAL HIGH (ref 4.8–5.6)
Mean Plasma Glucose: 157 mg/dL

## 2015-10-18 SURGERY — LEFT HEART CATH AND CORONARY ANGIOGRAPHY
Anesthesia: LOCAL

## 2015-10-18 MED ORDER — ACETAMINOPHEN 325 MG PO TABS: 650.0000 mg | ORAL_TABLET | ORAL | Status: DC | PRN

## 2015-10-18 MED ORDER — METOPROLOL TARTRATE 25 MG PO TABS
25.0000 mg | ORAL_TABLET | Freq: Two times a day (BID) | ORAL | Status: DC
Start: 2015-10-18 — End: 2016-02-08

## 2015-10-18 MED ORDER — FENTANYL CITRATE (PF) 100 MCG/2ML IJ SOLN
INTRAMUSCULAR | Status: DC | PRN
  Administered 2015-10-18: 25 ug via INTRAVENOUS

## 2015-10-18 MED ORDER — HEPARIN (PORCINE) IN NACL 2-0.9 UNIT/ML-% IJ SOLN
INTRAMUSCULAR | Status: AC
  Filled 2015-10-18: qty 1000

## 2015-10-18 MED ORDER — HEPARIN (PORCINE) IN NACL 2-0.9 UNIT/ML-% IJ SOLN
INTRAMUSCULAR | Status: AC
  Filled 2015-10-18: qty 500

## 2015-10-18 MED ORDER — ONDANSETRON HCL 4 MG/2ML IJ SOLN: 4.0000 mg | Freq: Four times a day (QID) | INTRAMUSCULAR | Status: DC | PRN

## 2015-10-18 MED ORDER — LIDOCAINE HCL (PF) 1 % IJ SOLN
INTRAMUSCULAR | Status: AC
  Filled 2015-10-18: qty 30

## 2015-10-18 MED ORDER — HEPARIN SODIUM (PORCINE) 1000 UNIT/ML IJ SOLN
INTRAMUSCULAR | Status: DC | PRN
  Administered 2015-10-18: 4500 [IU] via INTRAVENOUS

## 2015-10-18 MED ORDER — NITROGLYCERIN 1 MG/10 ML FOR IR/CATH LAB
INTRA_ARTERIAL | Status: AC
  Filled 2015-10-18: qty 10

## 2015-10-18 MED ORDER — SODIUM CHLORIDE 0.9% FLUSH: 3.0000 mL | Freq: Two times a day (BID) | INTRAVENOUS | Status: DC

## 2015-10-18 MED ORDER — SODIUM CHLORIDE 0.9 % IV SOLN: 250.0000 mL | INTRAVENOUS | Status: DC | PRN

## 2015-10-18 MED ORDER — NITROGLYCERIN 0.4 MG SL SUBL: 0.4000 mg | SUBLINGUAL_TABLET | SUBLINGUAL | Status: DC | PRN

## 2015-10-18 MED ORDER — VERAPAMIL HCL 2.5 MG/ML IV SOLN
INTRAVENOUS | Status: DC | PRN
  Administered 2015-10-18: 08:00:00 via INTRA_ARTERIAL

## 2015-10-18 MED ORDER — NITROGLYCERIN 1 MG/10 ML FOR IR/CATH LAB
INTRA_ARTERIAL | Status: DC | PRN
  Administered 2015-10-18: 200 ug via INTRA_ARTERIAL

## 2015-10-18 MED ORDER — ATORVASTATIN CALCIUM 40 MG PO TABS: 40.0000 mg | ORAL_TABLET | Freq: Every day | ORAL | Status: DC

## 2015-10-18 MED ORDER — MIDAZOLAM HCL 2 MG/2ML IJ SOLN
INTRAMUSCULAR | Status: AC
  Filled 2015-10-18: qty 2

## 2015-10-18 MED ORDER — SODIUM CHLORIDE 0.9 % WEIGHT BASED INFUSION: 1.0000 mL/kg/h | INTRAVENOUS | Status: AC

## 2015-10-18 MED ORDER — FENTANYL CITRATE (PF) 100 MCG/2ML IJ SOLN
INTRAMUSCULAR | Status: AC
  Filled 2015-10-18: qty 2

## 2015-10-18 MED ORDER — LIDOCAINE HCL (PF) 1 % IJ SOLN
INTRAMUSCULAR | Status: DC | PRN
  Administered 2015-10-18: 2 mL

## 2015-10-18 MED ORDER — IOPAMIDOL (ISOVUE-370) INJECTION 76%
INTRAVENOUS | Status: AC
  Filled 2015-10-18: qty 100

## 2015-10-18 MED ORDER — IOPAMIDOL (ISOVUE-370) INJECTION 76%
INTRAVENOUS | Status: DC | PRN
  Administered 2015-10-18: 50 mL via INTRAVENOUS

## 2015-10-18 MED ORDER — HEPARIN SODIUM (PORCINE) 1000 UNIT/ML IJ SOLN
INTRAMUSCULAR | Status: AC
  Filled 2015-10-18: qty 1

## 2015-10-18 MED ORDER — MIDAZOLAM HCL 2 MG/2ML IJ SOLN
INTRAMUSCULAR | Status: DC | PRN
  Administered 2015-10-18: 2 mg via INTRAVENOUS

## 2015-10-18 MED ORDER — SODIUM CHLORIDE 0.9% FLUSH: 3.0000 mL | INTRAVENOUS | Status: DC | PRN

## 2015-10-18 MED ORDER — VERAPAMIL HCL 2.5 MG/ML IV SOLN
INTRAVENOUS | Status: AC
  Filled 2015-10-18: qty 2

## 2015-10-18 SURGICAL SUPPLY — 11 items
CATH IMPULSE 5F ANG/FL3.5 (CATHETERS) ×1 IMPLANT
CATH INFINITI 5 FR 3DRC (CATHETERS) ×1 IMPLANT
DEVICE RAD COMP TR BAND LRG (VASCULAR PRODUCTS) ×1 IMPLANT
GLIDESHEATH SLEND SS 6F .021 (SHEATH) ×1 IMPLANT
GUIDEWIRE ANGLED .035X150CM (WIRE) ×1 IMPLANT
KIT HEART LEFT (KITS) ×2 IMPLANT
PACK CARDIAC CATHETERIZATION (CUSTOM PROCEDURE TRAY) ×2 IMPLANT
TRANSDUCER W/STOPCOCK (MISCELLANEOUS) ×2 IMPLANT
TUBING CIL FLEX 10 FLL-RA (TUBING) ×2 IMPLANT
WIRE HI TORQ VERSACORE-J 145CM (WIRE) ×1 IMPLANT
WIRE SAFE-T 1.5MM-J .035X260CM (WIRE) ×1 IMPLANT

## 2015-10-18 NOTE — Progress Notes (Signed)
Had episode of increased CP 10/10 (Left side, worse with inspiration).  Morphine '2mg'$  IV given.  2L Oxygen nasal canula applied; EKG obtained.  VSS.  Also, 1 SL ntg given.  Most of the CP resolved after NTG.  Cardiology Fellow Claiborne Billings) notified.  Will continue to monitor.

## 2015-10-18 NOTE — Care Management Obs Status (Signed)
[facility] NOTIFICATION   Patient Details  Name: Mccade Sullenberger MRN: [id number] Date of Birth: [birth year]   Medicare Observation Status Notification Given:  Yes (chest pain)    Bethena Roys, RN 10/18/2015, 12:05 PM

## 2015-10-18 NOTE — Discharge Instructions (Signed)
Radial Site Care °Refer to this sheet in the next few weeks. These instructions provide you with information about caring for yourself after your procedure. Your health care provider may also give you more specific instructions. Your treatment has been planned according to current medical practices, but problems sometimes occur. Call your health care provider if you have any problems or questions after your procedure. °WHAT TO EXPECT AFTER THE PROCEDURE °After your procedure, it is typical to have the following: °· Bruising at the radial site that usually fades within 1-2 weeks. °· Blood collecting in the tissue (hematoma) that may be painful to the touch. It should usually decrease in size and tenderness within 1-2 weeks. °HOME CARE INSTRUCTIONS °· Take medicines only as directed by your health care provider. °· You may shower 24-48 hours after the procedure or as directed by your health care provider. Remove the bandage (dressing) and gently wash the site with plain soap and water. Pat the area dry with a clean towel. Do not rub the site, because this may cause bleeding. °· Do not take baths, swim, or use a hot tub until your health care provider approves. °· Check your insertion site every day for redness, swelling, or drainage. °· Do not apply powder or lotion to the site. °· Do not flex or bend the affected arm for 24 hours or as directed by your health care provider. °· Do not push or pull heavy objects with the affected arm for 24 hours or as directed by your health care provider. °· Do not lift over 10 lb (4.5 kg) for 5 days after your procedure or as directed by your health care provider. °· Ask your health care provider when it is okay to: °¨ Return to work or school. °¨ Resume usual physical activities or sports. °¨ Resume sexual activity. °· Do not drive home if you are discharged the same day as the procedure. Have someone else drive you. °· You may drive 24 hours after the procedure unless otherwise  instructed by your health care provider. °· Do not operate machinery or power tools for 24 hours after the procedure. °· If your procedure was done as an outpatient procedure, which means that you went home the same day as your procedure, a responsible adult should be with you for the first 24 hours after you arrive home. °· Keep all follow-up visits as directed by your health care provider. This is important. °SEEK MEDICAL CARE IF: °· You have a fever. °· You have chills. °· You have increased bleeding from the radial site. Hold pressure on the site. °SEEK IMMEDIATE MEDICAL CARE IF: °· You have unusual pain at the radial site. °· You have redness, warmth, or swelling at the radial site. °· You have drainage (other than a small amount of blood on the dressing) from the radial site. °· The radial site is bleeding, and the bleeding does not stop after 30 minutes of holding steady pressure on the site. °· Your arm or hand becomes pale, cool, tingly, or numb. °  °This information is not intended to replace advice given to you by your health care provider. Make sure you discuss any questions you have with your health care provider. °  °Document Released: 07/01/2010 Document Revised: 06/19/2014 Document Reviewed: 12/15/2013 °Elsevier Interactive Patient Education ©2016 [employer] ° °

## 2015-10-18 NOTE — Interval H&P Note (Signed)
Cath Lab Visit (complete for each Cath Lab visit)  Clinical Evaluation Leading to the Procedure:   ACS: Yes.    Non-ACS:    Anginal Classification: CCS IV  Anti-ischemic medical therapy: Minimal Therapy (1 class of medications)  Non-Invasive Test Results: Low-risk stress test findings: cardiac mortality <1%/year  Prior CABG: No previous CABG      History and Physical Interval Note:  10/18/2015 7:38 AM  Andre [name]  has presented today for surgery, with the diagnosis of unstable angina  The various methods of treatment have been discussed with the patient and family. After consideration of risks, benefits and other options for treatment, the patient has consented to  Procedure(s): Left Heart Cath and Coronary Angiography (N/A) as a surgical intervention .  The patient's history has been reviewed, patient examined, no change in status, stable for surgery.  I have reviewed the patient's chart and labs.  Questions were answered to the patient's satisfaction.     Andre [name] S.

## 2015-10-18 NOTE — Telephone Encounter (Signed)
LEFT MESSAGE TO CALLBACK - IN REGARDS TO TEST RESULTS  APPOINTMENT SCHEDULE FOR NEXT AVAILABLE WHICH IS 10/19/15 Tuesday AT 11:15 AM  AWAITING FOR PATIENT TO RETURN CALL

## 2015-10-18 NOTE — Telephone Encounter (Signed)
-----   Message from Leonie Man, MD sent at 10/15/2015  6:37 PM EDT ----- This is an INTERMEDIATE RISK study with signs of a prior heart attack and an area that is currently not receiving enough blood flow. The pump function is also somewhat reduced. With these results, I think is probably best for [place] to consider a rate in the chest discomfort with a heart catheterization.  I will ask my nurse to contact you in order to get he come in for a clinic evaluation to discuss the results if he would like, otherwise we could simply schedule cardiac catheterization if you would prefer & are comfortable with the procedure, etc.   HARDING, Leonie Green, M.D., M.S. Interventional Cardiologist   Pager # [phone] Phone # [phone] [address]. [place], [place] [address]

## 2015-10-18 NOTE — Telephone Encounter (Signed)
Reviewed chart - noted patient admitted over the weekend and cath today 10/18/15 appointment cancelled for 10/19/15 Spoke to patient He verbalized understanding. Patient states he chest pain if he breathes or moves or bend over  [place]  follow up with primary tomorrow   patient verbalized understanding.

## 2015-10-18 NOTE — Discharge Summary (Signed)
Discharge Summary    Patient ID: Andre [name],  MRN: [id number], DOB/AGE: 74-Sep-1942 74 y.o.  Admit date: 10/17/2015 Discharge date: 10/18/2015  Primary Care Provider: HOPPER,DAVID Primary Cardiologist: Dr. Ellyn Hack  Discharge Diagnoses    Principal Problem:   Chest pain with moderate risk for cardiac etiology Active Problems:   Essential hypertension, benign   Abnormal stress test    History of Present Illness     Andre [name] is a 75 y.o. male with past medical history of HTN and tobacco abuse who presented to [facility] ED on 10/17/2015 for evaluation of chest pain.   Reports having a sharp pain in his left pectoral region which woke him from sleep around 0300. Says the pain usually presents with exertion and is usually resolved within minutes. He has been previously examined by Dr. Ellyn Hack for this pain and a NST was performed on 10/15/2015. This was read as an intermediate-risk study with a moderate defect in the basal inferior, mid-inferior and apical region which was non-reversible. There was also a defect in the basal inferolateral location which was reversible and concerning for ischemia. Dr. Ellyn Hack has recommended a cardiac catheterization based on his symptomology and stress test results.    Hospital Course     Consultants: None   Later in the day on 10/17/2015, he was examined and reported still having pain. It was thought his pain was overall atypical in that it was worse with inspiration and palpation. Cyclic troponin values remained negative and his EKG showed no acute ischemic changes. However, with his recent abnormal nuclear study and continued symptoms, a cardiac catheterization was recommended. The risks and benefits were discussed with the patient and he agreed to proceed.   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 Continued aggressive preventive therapy was recommended.  He tolerated the procedure well and no complications were noted. TR band was removed without evidence of a hematoma. He was last examined by Dr. Acie Fredrickson and deemed stable for discharge. He will continue on ASA '81mg'$  daily, statin therapy, Diovan, and newly started Lopressor for improved BP control. Follow-Up with Dr. Ellyn Hack has been scheduled in 1 month.     _____________  Discharge Vitals:  Blood pressure 145/80, pulse 66, temperature 98.4 F (36.9 C), temperature source Oral, resp. rate 16, height '5\' 11"'$  (1.803 m), weight 205 lb 7.1 oz (93.189 kg), SpO2 94 %.  Filed Weights   10/17/15 0513 10/17/15 0821 10/18/15 0409  Weight: 205 lb (92.987 kg) 203 lb 11.2 oz (92.398 kg) 205 lb 7.1 oz (93.189 kg)    Discharge Physical Exam:  General: Well developed, well nourished male appearing in no acute distress. Head: Normocephalic, atraumatic. Neck: Supple without bruits, JVD not elevated. Lungs: Resp regular and unlabored, no wheezing or rales appreciated. Heart: Regular rhythm, S1, S2, no S3, S4, or murmur; no rub. Abdomen: Soft, non-tender, non-distended with normoactive bowel sounds. No hepatomegaly. No rebound/guarding. No obvious abdominal masses. Extremities: No clubbing, cyanosis, or edema. Distal pedal pulses are 2+ bilaterally. Neuro: Alert and oriented X 3. Moves all extremities spontaneously. Psych: Normal affect.  Labs & Radiologic Studies     CBC  Recent Labs  10/18/15 0529 10/18/15 0942  WBC 6.5 7.3  HGB 14.5 14.7  HCT 46.0 45.5  MCV 99.8 99.8  PLT 226 865   Basic Metabolic Panel  Recent Labs  10/17/15 0515 10/18/15 0529 10/18/15 0942  NA 139 136  --   K 3.6 3.6  --   CL 100* 102  --   CO2 29 24  --   GLUCOSE 116* 116*  --   BUN 9 6  --   CREATININE 0.94 0.75 0.85  CALCIUM 9.1 8.6*  --    Liver Function  Tests  Recent Labs  10/17/15 0842  AST 33  ALT 25  ALKPHOS 89  BILITOT 1.1  PROT 6.9  ALBUMIN 3.8   No results for input(s): LIPASE, AMYLASE in the last 72 hours. Cardiac Enzymes  Recent Labs  10/17/15 0842 10/17/15 1442 10/17/15 2118  TROPONINI <0.03 <0.03 <0.03   BNP Invalid input(s): POCBNP D-Dimer  Recent Labs  10/17/15 1442  DDIMER 0.33   Hemoglobin A1C  Recent Labs  10/17/15 0842  HGBA1C 7.1*   Fasting Lipid Panel  Recent Labs  10/17/15 0842  CHOL 160  HDL 37*  LDLCALC 91  TRIG 162*  CHOLHDL 4.3   Thyroid Function Tests  Recent Labs  10/17/15 0842  TSH 1.042    Dg Chest 2 View: 10/17/2015  CLINICAL DATA:  Chest pain for 2 weeks, worse today. Hypertension. Previous smoker. EXAM: CHEST  2 VIEW COMPARISON:  09/13/2015 FINDINGS: Cardiac enlargement without significant vascular congestion. Scarring or atelectasis in the lung bases similar to previous study. No focal consolidation. Prominent cardiac fat pad. No blunting of costophrenic angles. No pneumothorax. Mediastinal contours appear intact. Degenerative changes in the spine and shoulders. IMPRESSION: No active cardiopulmonary disease. Electronically Signed   By: Lucienne Capers M.D.   On: 10/17/2015 05:51    Diagnostic Studies/Procedures     Cardiac Catheterization: 10/18/2015      Nonobstructive CAD.  Mildly elevated LVEDP.  LV was not injected due to tortuosity in the radial and subclavian.  If cath was needed in the future, would not use right radial approach.  Continue aggressive preventive therapy.   Disposition   Pt is being discharged home today in good condition.  Follow-up Plans & Appointments    Follow-up Information    Follow up with Leonie Man, MD On 11/11/2015.   Specialty:  Cardiology   Why:  Cardiology Follow-Up on 11/11/2015 at 1:30PM.    Contact information:   [place] [phone]      Discharge  Instructions    Diet - low sodium heart healthy    Complete by:  As directed      Discharge instructions    Complete by:  As directed   Following TR band removal and bed rest complete.     Discharge instructions    Complete by:  As directed   PLEASE REMEMBER TO BRING ALL OF YOUR MEDICATIONS TO EACH OF YOUR FOLLOW-UP OFFICE VISITS.  PLEASE ATTEND ALL SCHEDULED FOLLOW-UP APPOINTMENTS.   Activity: Increase activity slowly as tolerated. You may shower, but no soaking baths (or swimming) for 1 week. No driving for 24 hours. No lifting over 5 lbs for 1 week. No sexual activity for 1 week.   You May Return to Work: in 1 week (if applicable)  Wound Care: You may wash cath site gently with soap and water. Keep cath site clean and dry. If you notice pain, swelling, bleeding or pus at your cath site, please call [phone].  Increase activity slowly    Complete by:  As directed            Discharge Medications   Current Discharge Medication List    START taking these medications   Details  atorvastatin (LIPITOR) 40 MG tablet Take 1 tablet (40 mg total) by mouth daily at 6 PM. Qty: 30 tablet, Refills: 6    metoprolol tartrate (LOPRESSOR) 25 MG tablet Take 1 tablet (25 mg total) by mouth 2 (two) times daily. Qty: 60 tablet, Refills: 6    nitroGLYCERIN (NITROSTAT) 0.4 MG SL tablet Place 1 tablet (0.4 mg total) under the tongue every 5 (five) minutes x 3 doses as needed for chest pain. Qty: 25 tablet, Refills: 3      CONTINUE these medications which have NOT CHANGED   Details  acetaminophen (TYLENOL) 500 MG tablet Take 1,000 mg by mouth every 6 (six) hours as needed for headache. Reported on 09/13/2015    aspirin EC 81 MG tablet Take 81 mg by mouth daily.     valsartan-hydrochlorothiazide (DIOVAN HCT) 160-25 MG per tablet Take 1 tablet by mouth daily. Qty: 30 tablet, Refills: 11         Allergies No Known Allergies  Outstanding Labs/Studies   None  Duration of Discharge  Encounter   Greater than 30 minutes including physician time.  Signed, Erma Heritage, PA-C 10/18/2015, 2:08 PM  Attending Note:   The patient was seen and examined.  Agree with assessment and plan as noted above.  Changes made to the above note as needed.  Pt has ruled out for CAD. Cath did not show any significant CAD He still has some atypical upper abdominal pain  I've asked him to see his general medical doctor  OK for DC today    Thayer Headings, Brooke Bonito., MD, [facility] 10/18/2015, 2:28 PM [address]  [place] Pager [phone]

## 2015-10-18 NOTE — Progress Notes (Signed)
Pt states he needs to speak to the Cardiologist regarding planned Cardiac Cath.  He has some questions that have not been answered and is not ready to sign the consent form.

## 2015-10-19 ENCOUNTER — Ambulatory Visit: Payer: Medicare Other | Admitting: Cardiology

## 2015-10-19 ENCOUNTER — Emergency Department (HOSPITAL_COMMUNITY)
Admission: EM | Admit: 2015-10-19 | Discharge: 2015-10-19 | Disposition: A | Discharge status: Home / Self Care | Payer: Medicare Other | Attending: Emergency Medicine | Admitting: Emergency Medicine

## 2015-10-19 ENCOUNTER — Emergency Department (HOSPITAL_COMMUNITY): Payer: Medicare Other

## 2015-10-19 ENCOUNTER — Encounter (HOSPITAL_COMMUNITY): Payer: Self-pay | Admitting: Emergency Medicine

## 2015-10-19 DIAGNOSIS — I2510 Atherosclerotic heart disease of native coronary artery without angina pectoris: Secondary | ICD-10-CM | POA: Insufficient documentation

## 2015-10-19 DIAGNOSIS — R1012 Left upper quadrant pain: Secondary | ICD-10-CM | POA: Diagnosis not present

## 2015-10-19 DIAGNOSIS — Z8719 Personal history of other diseases of the digestive system: Secondary | ICD-10-CM | POA: Insufficient documentation

## 2015-10-19 DIAGNOSIS — Z8673 Personal history of transient ischemic attack (TIA), and cerebral infarction without residual deficits: Secondary | ICD-10-CM | POA: Diagnosis not present

## 2015-10-19 DIAGNOSIS — Z79899 Other long term (current) drug therapy: Secondary | ICD-10-CM | POA: Diagnosis not present

## 2015-10-19 DIAGNOSIS — R079 Chest pain, unspecified: Secondary | ICD-10-CM | POA: Diagnosis not present

## 2015-10-19 DIAGNOSIS — Z7982 Long term (current) use of aspirin: Secondary | ICD-10-CM | POA: Insufficient documentation

## 2015-10-19 DIAGNOSIS — I10 Essential (primary) hypertension: Secondary | ICD-10-CM | POA: Diagnosis not present

## 2015-10-19 DIAGNOSIS — Z85048 Personal history of other malignant neoplasm of rectum, rectosigmoid junction, and anus: Secondary | ICD-10-CM | POA: Diagnosis not present

## 2015-10-19 DIAGNOSIS — Z87891 Personal history of nicotine dependence: Secondary | ICD-10-CM | POA: Insufficient documentation

## 2015-10-19 LAB — CBC WITH DIFFERENTIAL/PLATELET
Basophils Absolute: 0 10*3/uL (ref 0.0–0.1)
Basophils Relative: 0 %
Eosinophils Absolute: 0.2 10*3/uL (ref 0.0–0.7)
Eosinophils Relative: 2 %
HCT: 45.4 % (ref 39.0–52.0)
Hemoglobin: 14.7 g/dL (ref 13.0–17.0)
Lymphocytes Relative: 49 %
Lymphs Abs: 3.8 10*3/uL (ref 0.7–4.0)
MCH: 31.8 pg (ref 26.0–34.0)
MCHC: 32.4 g/dL (ref 30.0–36.0)
MCV: 98.3 fL (ref 78.0–100.0)
Monocytes Absolute: 0.7 10*3/uL (ref 0.1–1.0)
Monocytes Relative: 8 %
Neutro Abs: 3.2 10*3/uL (ref 1.7–7.7)
Neutrophils Relative %: 41 %
Platelets: 213 10*3/uL (ref 150–400)
RBC: 4.62 MIL/uL (ref 4.22–5.81)
RDW: 14.3 % (ref 11.5–15.5)
WBC: 7.8 10*3/uL (ref 4.0–10.5)

## 2015-10-19 LAB — COMPREHENSIVE METABOLIC PANEL WITH GFR
ALT: 23 U/L (ref 17–63)
AST: 42 U/L — ABNORMAL HIGH (ref 15–41)
Albumin: 3.6 g/dL (ref 3.5–5.0)
Alkaline Phosphatase: 72 U/L (ref 38–126)
Anion gap: 11 (ref 5–15)
BUN: 5 mg/dL — ABNORMAL LOW (ref 6–20)
CO2: 27 mmol/L (ref 22–32)
Calcium: 9.2 mg/dL (ref 8.9–10.3)
Chloride: 101 mmol/L (ref 101–111)
Creatinine, Ser: 0.78 mg/dL (ref 0.61–1.24)
GFR calc Af Amer: >60 mL/min
GFR calc non Af Amer: >60 mL/min
Glucose, Bld: 116 mg/dL — ABNORMAL HIGH (ref 65–99)
Potassium: 3.7 mmol/L (ref 3.5–5.1)
Sodium: 139 mmol/L (ref 135–145)
Total Bilirubin: 1.8 mg/dL — ABNORMAL HIGH (ref 0.3–1.2)
Total Protein: 7.1 g/dL (ref 6.5–8.1)

## 2015-10-19 LAB — URINALYSIS, ROUTINE W REFLEX MICROSCOPIC
Bilirubin Urine: NEGATIVE
Glucose, UA: NEGATIVE mg/dL
Hgb urine dipstick: NEGATIVE
Ketones, ur: NEGATIVE mg/dL
Leukocytes, UA: NEGATIVE
Nitrite: NEGATIVE
Protein, ur: NEGATIVE mg/dL
Specific Gravity, Urine: 1.011 (ref 1.005–1.030)
pH: 6 (ref 5.0–8.0)

## 2015-10-19 LAB — LIPASE, BLOOD: Lipase: 18 U/L (ref 11–51)

## 2015-10-19 MED ORDER — PANTOPRAZOLE SODIUM 20 MG PO TBEC: 20.0000 mg | DELAYED_RELEASE_TABLET | Freq: Every day | ORAL | Status: DC

## 2015-10-19 MED ORDER — GI COCKTAIL ~~LOC~~
30.0000 mL | Freq: Once | ORAL | Status: AC
  Administered 2015-10-19: 30 mL via ORAL
  Filled 2015-10-19: qty 30

## 2015-10-19 MED ORDER — HYDROCODONE-ACETAMINOPHEN 5-325 MG PO TABS
1.0000 | ORAL_TABLET | Freq: Once | ORAL | Status: AC
  Administered 2015-10-19: 1 via ORAL
  Filled 2015-10-19: qty 1

## 2015-10-19 MED FILL — Heparin Sodium (Porcine) 2 Unit/ML in Sodium Chloride 0.9%: INTRAMUSCULAR | Qty: 1000 | Status: AC

## 2015-10-19 NOTE — ED Notes (Signed)
Per pt, from home with c/o chest pain on inspiration. Per pt, he was discharged from the hospital yesterday afternoon. Pt states the pain got worse this am.

## 2015-10-19 NOTE — ED Provider Notes (Signed)
CSN: [id number]     Arrival date & time 10/19/15  8242 History   First MD Initiated Contact with Patient 10/19/15 [phone]     Chief Complaint  Patient presents with  . Chest Pain     (Consider location/radiation/quality/duration/timing/severity/associated sxs/prior Treatment) Patient is a 75 y.o. male presenting with abdominal pain.  Abdominal Pain Pain location:  LUQ Pain quality: sharp and shooting   Pain radiates to:  Does not radiate Pain severity:  Mild Onset quality:  Gradual Duration:  3 weeks Timing:  Intermittent Chronicity:  Chronic Context: not alcohol use, not previous surgeries and not recent illness   Relieved by:  None tried Worsened by:  Nothing tried Ineffective treatments:  None tried Associated symptoms: no anorexia, no diarrhea, no dysuria, no fatigue and no fever     Past Medical History  Diagnosis Date  . Arthritis   . Hypertension   . 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[place])   . Weakness   . HA (headache)   . TIA (transient ischemic attack)   . GERD (gastroesophageal reflux disease)   . Coronary artery disease, non-occlusive     a. 06/2011: cath for abnormal exercise echo: Only 30-40% proximal RCA. Otherwise normal. b. 10/2015: cath for abnormal NST and atypical CP --> showed mild 30-40% stenosis in the prox-distal RCA   Past Surgical History  Procedure Laterality Date  . Surgical excision of rectal cancer    . Cardiac catheterization  January 2013    Proximal RCA 30-40%. Otherwise normal.  . Nm myoview ltd  March 2015    LOW RISK. No scar or ischemia. Normal EF (55%). No RWMA  . Cardiac catheterization N/A 10/18/2015    Procedure: Left Heart Cath and Coronary Angiography;  Surgeon: Jettie Booze, MD;  Location: [facility];  Service: Cardiovascular;  Laterality: N/A;   Family History  Problem Relation Age of Onset  . Hypertension Mother   . Cancer Mother   . Liver disease Father    Social History  Substance Use Topics  . Smoking status: Former Smoker  -- 0.30 packs/day for 47 years    Types: Cigarettes    Quit date: 02/26/2015  . Smokeless tobacco: Never Used  . Alcohol Use: 0.0 oz/week    0 Standard drinks or equivalent per week    Review of Systems  Constitutional: Negative for fever and fatigue.  Eyes: Negative for pain.  Gastrointestinal: Positive for abdominal pain. Negative for diarrhea and anorexia.  Endocrine: Positive for polyuria. Negative for polydipsia.  Genitourinary: Negative for dysuria.  All other systems reviewed and are negative.     Allergies  Review of patient's allergies indicates no known allergies.  Home Medications   Prior to Admission medications   Medication Sig Start Date End Date Taking? Authorizing Provider  acetaminophen (TYLENOL) 500 MG tablet Take 1,000 mg by mouth every 6 (six) hours as needed for headache. Reported on 09/13/2015    Historical Provider, MD  aspirin EC 81 MG tablet Take 81 mg by mouth daily.     Historical Provider, MD  atorvastatin (LIPITOR) 40 MG tablet Take 1 tablet (40 mg total) by mouth daily at 6 PM. 10/18/15   Erma Heritage, PA  metoprolol tartrate (LOPRESSOR) 25 MG tablet Take 1 tablet (25 mg total) by mouth 2 (two) times daily. 10/18/15   Erma Heritage, PA  nitroGLYCERIN (NITROSTAT) 0.4 MG SL tablet Place 1 tablet (0.4 mg total) under the tongue every 5 (five) minutes x 3 doses as needed  for chest pain. 10/18/15   Erma Heritage, PA  valsartan-hydrochlorothiazide (DIOVAN HCT) 160-25 MG per tablet Take 1 tablet by mouth daily. 11/06/14   Tanda Rockers, MD   BP 134/81 mmHg  Pulse 57  Temp(Src) 97.6 F (36.4 C) (Oral)  Resp 15  SpO2 99% Physical Exam  Constitutional: He is oriented to person, place, and time. He appears well-developed and well-nourished.  HENT:  Head: Normocephalic and atraumatic.  Neck: Normal range of motion.  Cardiovascular: Normal rate.   Pulmonary/Chest: Effort normal. No respiratory distress. He has no wheezes. He has no rales.   Abdominal: Soft. Bowel sounds are normal. He exhibits no distension. There is tenderness (very slight, with deep palpation in luq).  Musculoskeletal: Normal range of motion. He exhibits no edema or tenderness.  Neurological: He is alert and oriented to person, place, and time. No cranial nerve deficit.  Skin: Skin is warm and dry.  Nursing note and vitals reviewed.   ED Course  Procedures (including critical care time) Labs Review Labs Reviewed - No data to display  Imaging Review No results found. I have personally reviewed and evaluated these images and lab results as part of my medical decision-making.   EKG Interpretation   Date/Time:  Tuesday Oct 19 2015 06:35:56 EDT Ventricular Rate:  65 PR Interval:  202 QRS Duration: 96 QT Interval:  420 QTC Calculation: 436 R Axis:   -4 Text Interpretation:  Normal sinus rhythm Nonspecific ST and T wave  abnormality Abnormal ECG No significant change since last tracing  Confirmed by POLLINA  MD, CHRISTOPHER [phone]) on 10/19/2015 6:47:40 AM      MDM   Final diagnoses:  None   C/o 'lung pain' to triage and to myself, however his pain seems to be located in luq. Pleuritic in nature. No cough, fever or other symptoms. Did have a l heart cath yesterday that was nonobstructive.  Exam as above with minimal findings, low suspicion for intraabdominal findings however 2/2 age, will screen with labs and attempt symptomatic treatment.  Labs without abnormality. Symptoms improved with GI cocktail suspect gastritis v PUD, will continue to follow up with PCP.   New Prescriptions: Discharge Medication List as of 10/19/2015 10:21 AM    START taking these medications   Details  pantoprazole (PROTONIX) 20 MG tablet Take 1 tablet (20 mg total) by mouth daily., Starting 10/19/2015, Until Discontinued, Print         I have personally and contemperaneously reviewed labs and imaging and used in my decision making as above.   A medical screening  exam was performed and I feel the patient has had an appropriate workup for their chief complaint at this time and likelihood of emergent condition existing is low and thus workup can continue on an outpatient basis.. Their vital signs are stable. They have been counseled on decision, discharge, follow up and which symptoms necessitate immediate return to the emergency department.  They verbally stated understanding and agreement with plan and discharged in stable condition.      Merrily Pew, MD 10/20/15 1059

## 2015-10-20 ENCOUNTER — Emergency Department (HOSPITAL_COMMUNITY)
Admission: EM | Admit: 2015-10-20 | Discharge: 2015-10-20 | Disposition: A | Discharge status: Home / Self Care | Payer: Medicare Other | Attending: Emergency Medicine | Admitting: Emergency Medicine

## 2015-10-20 ENCOUNTER — Emergency Department (HOSPITAL_COMMUNITY): Payer: Medicare Other

## 2015-10-20 ENCOUNTER — Encounter (HOSPITAL_COMMUNITY): Payer: Self-pay | Admitting: *Deleted

## 2015-10-20 DIAGNOSIS — Z87891 Personal history of nicotine dependence: Secondary | ICD-10-CM | POA: Insufficient documentation

## 2015-10-20 DIAGNOSIS — J9811 Atelectasis: Secondary | ICD-10-CM

## 2015-10-20 DIAGNOSIS — I10 Essential (primary) hypertension: Secondary | ICD-10-CM | POA: Insufficient documentation

## 2015-10-20 DIAGNOSIS — R091 Pleurisy: Secondary | ICD-10-CM | POA: Insufficient documentation

## 2015-10-20 DIAGNOSIS — M1990 Unspecified osteoarthritis, unspecified site: Secondary | ICD-10-CM | POA: Insufficient documentation

## 2015-10-20 DIAGNOSIS — Z85048 Personal history of other malignant neoplasm of rectum, rectosigmoid junction, and anus: Secondary | ICD-10-CM | POA: Diagnosis not present

## 2015-10-20 DIAGNOSIS — Z79899 Other long term (current) drug therapy: Secondary | ICD-10-CM | POA: Insufficient documentation

## 2015-10-20 DIAGNOSIS — R079 Chest pain, unspecified: Secondary | ICD-10-CM | POA: Diagnosis not present

## 2015-10-20 DIAGNOSIS — K219 Gastro-esophageal reflux disease without esophagitis: Secondary | ICD-10-CM | POA: Diagnosis not present

## 2015-10-20 DIAGNOSIS — Z8673 Personal history of transient ischemic attack (TIA), and cerebral infarction without residual deficits: Secondary | ICD-10-CM | POA: Insufficient documentation

## 2015-10-20 DIAGNOSIS — Z7982 Long term (current) use of aspirin: Secondary | ICD-10-CM | POA: Diagnosis not present

## 2015-10-20 DIAGNOSIS — I2510 Atherosclerotic heart disease of native coronary artery without angina pectoris: Secondary | ICD-10-CM | POA: Insufficient documentation

## 2015-10-20 DIAGNOSIS — Z9889 Other specified postprocedural states: Secondary | ICD-10-CM | POA: Diagnosis not present

## 2015-10-20 LAB — H. PYLORI ANTIBODY, IGG: H Pylori IgG: 1.1 U/mL — ABNORMAL HIGH (ref 0.0–0.8)

## 2015-10-20 MED ORDER — KETOROLAC TROMETHAMINE 30 MG/ML IJ SOLN
30.0000 mg | Freq: Once | INTRAMUSCULAR | Status: AC
  Administered 2015-10-20: 30 mg via INTRAMUSCULAR
  Filled 2015-10-20: qty 1

## 2015-10-20 MED ORDER — IOPAMIDOL (ISOVUE-370) INJECTION 76%
100.0000 mL | Freq: Once | INTRAVENOUS | Status: AC | PRN
  Administered 2015-10-20: 100 mL via INTRAVENOUS

## 2015-10-20 MED ORDER — TRAMADOL HCL 50 MG PO TABS: 50.0000 mg | ORAL_TABLET | Freq: Four times a day (QID) | ORAL | Status: DC | PRN

## 2015-10-20 NOTE — ED Notes (Signed)
PT DISCHARGED. INSTRUCTIONS AND PRESCRIPTION GIVEN. AAOX3. PT IN NO APPARENT DISTRESS OR PAIN. THE OPPORTUNITY TO ASK QUESTIONS WAS PROVIDED. 

## 2015-10-20 NOTE — ED Notes (Signed)
Patient transported to CT 

## 2015-10-20 NOTE — ED Provider Notes (Signed)
CSN: [id number]     Arrival date & time 10/20/15  5361 History   First MD Initiated Contact with Patient 10/20/15 0703     Chief Complaint  Patient presents with  . Chest Pain    Rib pain       HPI Pt state that he has been seen the last 3 days in the ER for left rib pain. Denies recent injury cough fever or other sx  Past Medical History  Diagnosis Date  . Arthritis   . Hypertension   . Weakness   . HA (headache)   . TIA (transient ischemic attack)   . GERD (gastroesophageal reflux disease)   . Coronary artery disease, non-occlusive     a. 06/2011: cath for abnormal exercise echo: Only 30-40% proximal RCA. Otherwise normal. b. 10/2015: cath for abnormal NST and atypical CP --> showed mild 30-40% stenosis in the prox-distal RCA  . Rectal cancer [facility])    Past Surgical History  Procedure Laterality Date  . Surgical excision of rectal cancer    . Cardiac catheterization  January 2013    Proximal RCA 30-40%. Otherwise normal.  . Nm myoview ltd  March 2015    LOW RISK. No scar or ischemia. Normal EF (55%). No RWMA  . Cardiac catheterization N/A 10/18/2015    Procedure: Left Heart Cath and Coronary Angiography;  Surgeon: Jettie Booze, MD;  Location: [facility];  Service: Cardiovascular;  Laterality: N/A;   Family History  Problem Relation Age of Onset  . Hypertension Mother   . Cancer Mother   . Liver disease Father    Social History  Substance Use Topics  . Smoking status: Former Smoker -- 0.30 packs/day for 47 years    Types: Cigarettes    Quit date: 02/26/2015  . Smokeless tobacco: Never Used  . Alcohol Use: 0.0 oz/week    0 Standard drinks or equivalent per week    Review of Systems  All other systems reviewed and are negative.     Allergies  Review of patient's allergies indicates no known allergies.  Home Medications   Prior to Admission medications   Medication Sig Start Date End Date Taking? Authorizing Provider  acetaminophen (TYLENOL)  500 MG tablet Take 500 mg by mouth 2 (two) times daily. Reported on 09/13/2015   Yes Historical Provider, MD  aspirin 325 MG tablet Take 325 mg by mouth daily.   Yes Historical Provider, MD  atorvastatin (LIPITOR) 40 MG tablet Take 1 tablet (40 mg total) by mouth daily at 6 PM. Patient taking differently: Take 40 mg by mouth daily after breakfast.  10/18/15  Yes Erma Heritage, PA  metoprolol tartrate (LOPRESSOR) 25 MG tablet Take 1 tablet (25 mg total) by mouth 2 (two) times daily. 10/18/15  Yes Erma Heritage, PA  nitroGLYCERIN (NITROSTAT) 0.4 MG SL tablet Place 1 tablet (0.4 mg total) under the tongue every 5 (five) minutes x 3 doses as needed for chest pain. 10/18/15  Yes Erma Heritage, PA  pantoprazole (PROTONIX) 20 MG tablet Take 1 tablet (20 mg total) by mouth daily. 10/19/15  Yes Merrily Pew, MD  valsartan-hydrochlorothiazide (DIOVAN HCT) 160-25 MG per tablet Take 1 tablet by mouth daily. 11/06/14  Yes Tanda Rockers, MD  traMADol (ULTRAM) 50 MG tablet Take 1 tablet (50 mg total) by mouth every 6 (six) hours as needed. 10/20/15   Leonard Schwartz, MD   BP 130/86 mmHg  Pulse 62  Temp(Src) 97.9 F (36.6 C) (Oral)  Resp 19  Ht '5\' 11"'$  (1.803 m)  Wt 205 lb (92.987 kg)  BMI 28.60 kg/m2  SpO2 97% Physical Exam  Constitutional: He is oriented to person, place, and time. He appears well-developed and well-nourished. No distress.  HENT:  Head: Normocephalic and atraumatic.  Eyes: Pupils are equal, round, and reactive to light.  Neck: Normal range of motion.  Cardiovascular: Normal rate and intact distal pulses.   Pulmonary/Chest: No respiratory distress.    Abdominal: Soft. Normal appearance and bowel sounds are normal. He exhibits no distension. There is no hepatosplenomegaly. There is no tenderness. There is no CVA tenderness. No hernia.  Musculoskeletal: Normal range of motion.  Neurological: He is alert and oriented to person, place, and time. No cranial nerve deficit.  Skin: Skin is  warm and dry. No rash noted.  Psychiatric: He has a normal mood and affect. His behavior is normal.  Nursing note and vitals reviewed.   ED Course  Procedures (including critical care time) Labs Review Labs Reviewed - No data to display  Imaging Review Dg Chest 2 View  10/19/2015  CLINICAL DATA:  Left side chest pain, possible pneumonia EXAM: CHEST  2 VIEW COMPARISON:  10/17/2015 FINDINGS: Cardiomediastinal silhouette is stable. There is subtle streaky left basilar atelectasis or early infiltrate. No pulmonary edema. Degenerative changes thoracic spine. IMPRESSION: Subtle streaky left basilar atelectasis or infiltrate. No pulmonary edema. Degenerative changes thoracic spine. Electronically Signed   By: Lahoma Crocker M.D.   On: 10/19/2015 07:54   Ct Angio Chest Pe W/cm &/or Wo Cm  10/20/2015  CLINICAL DATA:  Left-sided rib pain. EXAM: CT ANGIOGRAPHY CHEST WITH CONTRAST TECHNIQUE: Multidetector CT imaging of the chest was performed using the standard protocol during bolus administration of intravenous contrast. Multiplanar CT image reconstructions and MIPs were obtained to evaluate the vascular anatomy. CONTRAST:  100 cc Isovue 370 intravenous COMPARISON:  None. FINDINGS: THORACIC INLET/BODY WALL: No acute abnormality. MEDIASTINUM: Cardiomegaly without pericardial effusion. Coronary atherosclerosis. No evidence of pulmonary embolism or acute aortic syndrome. No adenopathy. LUNG WINDOWS: Mild dependent atelectasis. There is no edema, consolidation, effusion, or pneumothorax. UPPER ABDOMEN: No acute findings. OSSEOUS: Bulky endplate spurring with diffuse thoracic ankylosis most compatible with diffuse idiopathic skeletal hyperostosis. Review of the MIP images confirms the above findings. IMPRESSION: 1. Negative for pulmonary embolism or other acute finding. 2. Left basilar opacity on previous chest x-ray is atelectasis. 3. 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  Electronically Signed   By: Monte Fantasia M.D.   On: 10/20/2015 08:21   I have personally reviewed and evaluated these images and lab results as part of my medical decision-making.   EKG Interpretation   Date/Time:  Wednesday Oct 20 2015 07:03:51 EDT Ventricular Rate:  58 PR Interval:  207 QRS Duration: 105 QT Interval:  421 QTC Calculation: 413 R Axis:   23 Text Interpretation:  Sinus rhythm Abnormal T, consider ischemia, anterior  leads No significant change since last tracing Confirmed by Jearlean Demauro  MD,  Eldena Dede ([id number]) on 10/20/2015 7:07:51 AM     Is pleuritic with deep breathing and can be reproducible with palpation.  Had negative delta troponins done the last 2 days.  Low likelihood of ACS. MDM   Final diagnoses:  Atelectasis  Pleurisy        Leonard Schwartz, MD 10/20/15 [phone]

## 2015-10-20 NOTE — Discharge Instructions (Signed)
Pleurisy Pleurisy is an inflammation and swelling of the lining of the lungs (pleura). Because of this inflammation, it hurts to breathe. It can be aggravated by coughing, laughing, or deep breathing. Pleurisy is often caused by an underlying infection or disease.  HOME CARE INSTRUCTIONS  Monitor your pleurisy for any changes. The following actions may help to alleviate any discomfort you are experiencing:  Medicine may help with pain. Only take over-the-counter or prescription medicines for pain, discomfort, or fever as directed by your health care provider.  Only take antibiotic medicine as directed. Make sure to finish it even if you start to feel better. SEEK MEDICAL CARE IF:   Your pain is not controlled with medicine or is increasing.  You have an increase in pus-like (purulent) secretions brought up with coughing. SEEK IMMEDIATE MEDICAL CARE IF:   You have blue or dark lips, fingernails, or toenails.  You are coughing up blood.  You have increased difficulty breathing.  You have continuing pain unrelieved by medicine or pain lasting more than 1 week.  You have pain that radiates into your neck, arms, or jaw.  You develop increased shortness of breath or wheezing.  You develop a fever, rash, vomiting, fainting, or other serious symptoms. MAKE SURE YOU:  Understand these instructions.   Will watch your condition.   Will get help right away if you are not doing well or get worse.    This information is not intended to replace advice given to you by your health care provider. Make sure you discuss any questions you have with your health care provider.   Document Released: 05/29/2005 Document Revised: 01/29/2013 Document Reviewed: 11/10/2012 Elsevier Interactive Patient Education 2016 Elsevier Inc[employer] Atelectasis, Adult Atelectasis is a collapse of the small air sacs in the lungs (alveoli). When this occurs, all or part of a lung collapses and becomes airless. It can be  caused by various things and is a common problem after surgery. The severity of atelectasis will vary depending on the size of the area involved and the underlying cause of the condition. CAUSES  There are multiple causes for atelectasis:   Shallow breathing, particularly if there is an injury to your chest wall or abdomen that makes it painful to take a deep breath. This commonly occurs after surgery.  Obstruction of your airways (bronchi or bronchioles). This may be caused by a buildup of mucus (mucus plug), tumors, blood clots (pulmonary embolus), or inhaled foreign bodies. Mucus plugs occur when the lungs do not expand enough to get rid of mucus.  Outside pressure on the lung. This may be caused by tumors, fluid (pleural effusion), or a leakage of air between the lung and rib cage (pneumothorax).   Infections such as pneumonia.  Scarring in lung tissue left over from previous infection or injury.  Some diseases such as cystic fibrosis. SIGNS AND SYMPTOMS  Often, atelectasis will have no symptoms. When symptoms occur, they include:  Shortness of breath.   Bluish color to your nails, lips, or mouth (cyanosis). DIAGNOSIS  Your health care provider may suspect atelectasis based on symptoms and physical findings. A chest X-ray may be done to confirm the diagnosis. More specialized X-ray exams are sometimes required.  TREATMENT  Treatment will depend on the cause of the atelectasis. Treatment may include:  Purposeful coughing to loosen mucus plugs in the lungs.  Chest physiotherapy. This consists of clapping or percussion on the chest over the lungs to further loosen mucus plugs.  Postural drainage techniques.  This involves positioning your body so your head is lower than your chest. [place] relaxed deep breathing whenever you are sitting down. A good technique is to take a few relaxed deep breaths each time a commercial comes on if you are watching  television.  If you were given a deep breathing device (such as an incentive spirometer) or a mucus clearance device, use this regularly as directed by your health care provider.  Try to cough several times a day as directed by your health care provider.  Perform any chest physiotherapy or postural drainage techniques as directed by your health care provider. If necessary, have someone (such as a family member) assist you with these techniques.  When lying down, lie on the unaffected side to encourage mucus drainage.  Stay physically active as much as possible. SEEK IMMEDIATE MEDICAL CARE IF:   You develop increasing problems with your breathing.   You develop severe chest pain.   You develop severe coughing, or you cough up blood.   You have a fever or persistent symptoms for more than 2-3 days.   You have a fever and your symptoms suddenly get worse.  MAKE SURE YOU:  Understand these instructions.  Will watch your condition.  Will get help right away if you are not doing well or get worse.   This information is not intended to replace advice given to you by your health care provider. Make sure you discuss any questions you have with your health care provider.   Document Released: 05/29/2005 Document Revised: 06/19/2014 Document Reviewed: 12/04/2012 Elsevier Interactive Patient Education [employer].

## 2015-10-20 NOTE — ED Notes (Signed)
Pt state that he has been seen the last 3 days in the ER for left rib pain. Denies recent injury cough fever or other sx

## 2015-10-22 ENCOUNTER — Ambulatory Visit: Payer: Medicare Other | Admitting: Internal Medicine

## 2015-11-03 ENCOUNTER — Ambulatory Visit (INDEPENDENT_AMBULATORY_CARE_PROVIDER_SITE_OTHER): Payer: Medicare Other

## 2015-11-03 ENCOUNTER — Ambulatory Visit (INDEPENDENT_AMBULATORY_CARE_PROVIDER_SITE_OTHER): Payer: Medicare Other | Admitting: Physician Assistant

## 2015-11-03 VITALS — BP 110/66 | HR 60 | Temp 98.1°F | Resp 18 | Ht 71.0 in | Wt 198.0 lb

## 2015-11-03 DIAGNOSIS — R070 Pain in throat: Secondary | ICD-10-CM | POA: Diagnosis not present

## 2015-11-03 DIAGNOSIS — R195 Other fecal abnormalities: Secondary | ICD-10-CM | POA: Diagnosis not present

## 2015-11-03 DIAGNOSIS — K219 Gastro-esophageal reflux disease without esophagitis: Secondary | ICD-10-CM | POA: Diagnosis not present

## 2015-11-03 LAB — COMPLETE METABOLIC PANEL WITH GFR
ALBUMIN: 4.1 g/dL (ref 3.6–5.1)
ALK PHOS: 89 U/L (ref 40–115)
ALT: 16 U/L (ref 9–46)
AST: 27 U/L (ref 10–35)
BILIRUBIN TOTAL: 1.1 mg/dL (ref 0.2–1.2)
BUN: 7 mg/dL (ref 7–25)
CALCIUM: 9.4 mg/dL (ref 8.6–10.3)
CO2: 25 mmol/L (ref 20–31)
CREATININE: 0.81 mg/dL (ref 0.70–1.18)
Chloride: 98 mmol/L (ref 98–110)
GFR, Est African American: >89 mL/min (ref >=60)
GFR, Est Non African American: 88 mL/min (ref >=60)
GLUCOSE: 103 mg/dL — AB (ref 65–99)
Potassium: 3.7 mmol/L (ref 3.5–5.3)
SODIUM: 138 mmol/L (ref 135–146)
TOTAL PROTEIN: 7.2 g/dL (ref 6.1–8.1)

## 2015-11-03 LAB — POCT CBC
Granulocyte percent: 50.8 %G (ref 37–80)
HEMATOCRIT: 43.4 % — AB (ref 43.5–53.7)
Hemoglobin: 14.9 g/dL (ref 14.1–18.1)
LYMPH, POC: 2.6 (ref 0.6–3.4)
MCH, POC: 32.3 pg — AB (ref 27–31.2)
MCHC: 34.4 g/dL (ref 31.8–35.4)
MCV: 93.9 fL (ref 80–97)
MID (CBC): 0.5 (ref 0–0.9)
MPV: 5.5 fL (ref 0–99.8)
PLATELET COUNT, POC: 282 10*3/uL (ref 142–424)
POC Granulocyte: 3.2 (ref 2–6.9)
POC LYMPH %: 41.2 % (ref 10–50)
POC MID %: 8 %M (ref 0–12)
RBC: 4.62 M/uL — AB (ref 4.69–6.13)
RDW, POC: 14.5 %
WBC: 6.3 10*3/uL (ref 4.6–10.2)

## 2015-11-03 LAB — POCT SEDIMENTATION RATE: POCT SED RATE: 20 mm/h (ref 0–22)

## 2015-11-03 NOTE — Progress Notes (Signed)
11/03/2015 9:59 AM   DOB: 03-May-1941 / MRN: [id number]  SUBJECTIVE:  Andre [name] is a 75 y.o. male presenting for dark stools times 2 weeks and pain in the left side of his throat for 1 week now.  Reports his symptoms are worsening and he feels weak.  Was recently seen in the hospital for chest pain and CAD was ruled out via cath.  He has long history of smoking as well as GERD.  He has never seen a GI doctor.  He reports some difficulty swallowing today. He denies abdominal pain, fever, and loss of appetite.  He takes ASA 81 mg daily.  He did have a swallow study performed by Dr. [name] in 04/14/16 and those results are included in this note. Of note he has been taking pepto bismal and black stools started after this.   He has No Known Allergies.   He  has a past medical history of Arthritis; Hypertension; Weakness; HA (headache); TIA (transient ischemic attack); GERD (gastroesophageal reflux disease); Coronary artery disease, non-occlusive; and 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[place]).    He  reports that he quit smoking about 8 months ago. His smoking use included Cigarettes. He has a 14.1 pack-year smoking history. He has never used smokeless tobacco. He reports that he drinks alcohol. He reports that he does not use illicit drugs. He  has no sexual activity history on file. The patient  has past surgical history that includes surgical excision of rectal cancer; Cardiac catheterization (January 2013); NM MYOVIEW LTD (March 2015); and Cardiac catheterization (N/A, 10/18/2015).  His family history includes Cancer in his mother; Hypertension in his mother; Liver disease in his father.  Review of Systems  Constitutional: Positive for malaise/fatigue. Negative for fever.  Cardiovascular: Negative for chest pain.  Gastrointestinal: Negative for nausea.  Skin: Negative for itching and rash.  Neurological: Negative for dizziness and headaches.    Problem list and medications reviewed and updated by myself where necessary,  and exist elsewhere in the encounter.   OBJECTIVE:  BP 110/66 mmHg  Pulse 60  Temp(Src) 98.1 F (36.7 C) (Oral)  Resp 18  Ht '5\' 11"'$  (1.803 m)  Wt 198 lb (89.812 kg)  BMI 27.63 kg/m2  SpO2 100%  Physical Exam  Constitutional: He appears well-developed and well-nourished. No distress.  HENT:  Mouth/Throat: No oropharyngeal exudate.  Cardiovascular: Normal rate and regular rhythm.   Pulmonary/Chest: Effort normal and breath sounds normal.  Abdominal: Soft. Bowel sounds are normal. He exhibits no distension and no mass. There is no tenderness. There is no rebound and no guarding.  Musculoskeletal: Normal range of motion.  Lymphadenopathy:       Head (right side): No submandibular and no tonsillar adenopathy present.       Head (left side): No submandibular and no tonsillar adenopathy present.    He has no cervical adenopathy.  Skin: He is not diaphoretic.  Vitals reviewed.  CLINICAL DATA: 75 year old with atypical gastroesophageal reflux symptoms for approximately 14 months. Intermittent choking and coughing for months.  EXAM: ESOPHOGRAM / BARIUM SWALLOW / BARIUM TABLET STUDY  TECHNIQUE: Combined double contrast and single contrast examination performed using effervescent crystals, thick barium liquid, and thin barium liquid. The patient was observed with fluoroscopy swallowing a 13 mm barium sulphate tablet.  FLUOROSCOPY TIME: Radiation Exposure Index (as provided by the fluoroscopic device): 148.54 uGy*m2  If the device does not provide the exposure index:  Fluoroscopy Time: 1 minute and 12 seconds  Number of Acquired Images: Fluoro store  only.  COMPARISON: Chest radiographs 04/05/2015. Chest CT 08/09/2011.  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 There is a small reducible hiatal hernia. No focal mucosal ulceration, mass or stricture demonstrated. Rapid sequence imaging of the pharynx in the AP and  lateral projections demonstrates no mucosal abnormalities. Moderate cervical spondylosis noted without significant impression on the cervical esophagus.  No gastroesophageal reflux was elicited with the water siphon test. At the conclusion of the study, a 13 mm barium tablet was administered. This passed without delay into the stomach.  IMPRESSION: 1. Mild presbyesophagus. 2. No evidence of esophageal mass, ulceration or stricture. 3. No significant reflux.   Electronically Signed  By: Richardean Sale M.D.  On: 04/15/2015 11:27  Results for orders placed or performed in visit on 11/03/15 (from the past 72 hour(s))  POCT CBC     Status: Abnormal   Collection Time: 11/03/15  9:31 AM  Result Value Ref Range   WBC 6.3 4.6 - 10.2 K/uL   Lymph, poc 2.6 0.6 - 3.4   POC LYMPH PERCENT 41.2 10 - 50 %L   MID (cbc) 0.5 0 - 0.9   POC MID % 8.0 0 - 12 %M   POC Granulocyte 3.2 2 - 6.9   Granulocyte percent 50.8 37 - 80 %G   RBC 4.62 (A) 4.69 - 6.13 M/uL   Hemoglobin 14.9 14.1 - 18.1 g/dL   HCT, POC 43.4 (A) 43.5 - 53.7 %   MCV 93.9 80 - 97 fL   MCH, POC 32.3 (A) 27 - 31.2 pg   MCHC 34.4 31.8 - 35.4 g/dL   RDW, POC 14.5 %   Platelet Count, POC 282 142 - 424 K/uL   MPV 5.5 0 - 99.8 fL   CBC Latest Ref Rng 11/03/2015 10/19/2015 10/18/2015  WBC 4.6 - 10.2 K/uL 6.3 7.8 7.3  Hemoglobin 14.1 - 18.1 g/dL 14.9 14.7 14.7  Hematocrit 43.5 - 53.7 % 43.4(A) 45.4 45.5  Platelets 150 - 400 K/uL - 213 210      Dg Neck Soft Tissue  11/03/2015  CLINICAL DATA:  Throat pain, history of reflux EXAM: NECK SOFT TISSUES - 1+ VIEW COMPARISON:  None. FINDINGS: The airway is widely patent. The epiglottis and aryepiglottic folds are within normal limits. No significant adenoidal or tonsillar prominence is seen. Multilevel degenerative changes of the cervical spine are seen. This indents the posterior aspect of the cervical esophagus. No other focal abnormality is seen. IMPRESSION: No acute airway abnormality  is noted. Degenerative changes of the cervical spine are noted. Electronically Signed   By: Inez Catalina M.D.   On: 11/03/2015 09:11    ASSESSMENT AND PLAN  Andre [name] was seen today for follow-up and adenopathy.  Diagnoses and all orders for this visit:  Throat pain: Negative for masses and lymphadenompathy.  He has a long history of GERD and complains of some new and mild dysphagia, and thinks he may have a mass in his neck.  He needs to see a GI doctor. Will get him to Dr. Earlean Shawl for a more complete evaluation.   -     DG Neck Soft Tissue; Future  Dark stools: Secondary to pepto bismal administration. CBC pointing away from a bleeding ulcer.   -     POCT CBC -     COMPLETE METABOLIC PANEL WITH GFR -     POCT SEDIMENTATION RATE  Gastroesophageal reflux disease, esophagitis presence not specified -     Ambulatory referral  to Gastroenterology    The patient was advised to call or return to clinic if he does not see an improvement in symptoms or to seek the care of the closest emergency department if he worsens with the above plan.   Philis Fendt, MHS, PA-C [facility] 11/03/2015 9:59 AM

## 2015-11-03 NOTE — Patient Instructions (Addendum)
Follow-up Information    Follow up with Leonie Man, MD On 11/11/2015.   Specialty: Cardiology   Why: Cardiology Follow-Up on 11/11/2015 at 1:30PM.    Contact information:   [address] [place] [address] [phone]           IF you received an x-ray today, you will receive an invoice from [facility]. Please contact [facility] at [phone] with questions or concerns regarding your invoice.   IF you received labwork today, you will receive an invoice from [employer]. Please contact [facility] at [phone] with questions or concerns regarding your invoice.   Our billing staff will not be able to assist you with questions regarding bills from these companies.  You will be contacted with the lab results as soon as they are available. The fastest way to get your results is to activate your My Chart account. Instructions are located on the last page of this paperwork. If you have not heard from [place] regarding the results in 2 weeks, please contact this office.

## 2015-11-05 ENCOUNTER — Other Ambulatory Visit: Payer: Self-pay | Admitting: Internal Medicine

## 2015-11-05 MED ORDER — VALSARTAN-HYDROCHLOROTHIAZIDE 160-25 MG PO TABS: 1.0000 | ORAL_TABLET | Freq: Every day | ORAL | Status: DC

## 2015-11-11 ENCOUNTER — Ambulatory Visit: Payer: Medicare Other | Admitting: Cardiology

## 2015-11-11 ENCOUNTER — Encounter: Payer: Self-pay | Admitting: *Deleted

## 2015-12-13 ENCOUNTER — Telehealth: Payer: Self-pay | Admitting: Internal Medicine

## 2015-12-13 NOTE — Telephone Encounter (Signed)
Spoke with pt, advised that at last refill for his diovan he was advised that this needs to be refilled through his PCP.  Pt expressed understanding.  Nothing further needed.

## 2015-12-15 ENCOUNTER — Ambulatory Visit: Payer: Medicare Other

## 2015-12-21 ENCOUNTER — Encounter: Payer: Self-pay | Admitting: Internal Medicine

## 2015-12-21 ENCOUNTER — Ambulatory Visit (INDEPENDENT_AMBULATORY_CARE_PROVIDER_SITE_OTHER): Payer: Medicare Other | Admitting: Internal Medicine

## 2015-12-21 VITALS — BP 140/78 | HR 51 | Ht 71.0 in | Wt 202.8 lb

## 2015-12-21 DIAGNOSIS — I10 Essential (primary) hypertension: Secondary | ICD-10-CM | POA: Diagnosis not present

## 2015-12-21 DIAGNOSIS — J449 Chronic obstructive pulmonary disease, unspecified: Secondary | ICD-10-CM

## 2015-12-21 DIAGNOSIS — R05 Cough: Secondary | ICD-10-CM | POA: Diagnosis not present

## 2015-12-21 DIAGNOSIS — F17210 Nicotine dependence, cigarettes, uncomplicated: Secondary | ICD-10-CM

## 2015-12-21 DIAGNOSIS — Z720 Tobacco use: Secondary | ICD-10-CM

## 2015-12-21 DIAGNOSIS — R058 Other specified cough: Secondary | ICD-10-CM

## 2015-12-21 MED ORDER — FAMOTIDINE 20 MG PO TABS: ORAL_TABLET | ORAL | Status: DC

## 2015-12-21 MED ORDER — PANTOPRAZOLE SODIUM 40 MG PO TBEC: 40.0000 mg | DELAYED_RELEASE_TABLET | Freq: Every day | ORAL | Status: DC

## 2015-12-21 NOTE — Patient Instructions (Addendum)
The key is to stop smoking completely before smoking completely stops you!   Pantoprazole (protonix) 40 mg   Take  30-60 min before first meal of the day and Pepcid (famotidine)  20 mg one @  bedtime until return to office - this is the best way to tell whether stomach acid is contributing to your problem.    GERD (REFLUX)  is an extremely common cause of respiratory symptoms just like yours , many times with no obvious heartburn at all.    It can be treated with medication, but also with lifestyle changes including elevation of the head of your bed (ideally with 6 inch  bed blocks),  Smoking cessation, avoidance of late meals, excessive alcohol, and avoid fatty foods, chocolate, peppermint, colas, red wine, and acidic juices such as orange juice.  NO MINT OR MENTHOL PRODUCTS SO NO COUGH DROPS  USE SUGARLESS CANDY INSTEAD (Jolley ranchers or Stover's or Life Savers) or even ice chips will also do - the key is to swallow to prevent all throat clearing. NO OIL BASED VITAMINS - use powdered substitutes.     If you are satisfied with your treatment plan,  let your doctor know and he/she can either refill your medications or you can return here when your prescription runs out.     If in any way you are not 100% satisfied,  please tell us.  If 100% better, tell your friends!  Pulmonary follow up is as needed - keep in mind that since your heart and lung checked out fine while you were having chest pain that your swallowing tube is the likely source and we've already proven that you have problems with this so may need GI follow up but defer this to your primary care doctor

## 2015-12-21 NOTE — Progress Notes (Signed)
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 Advice Only    from Dr. Johnsie [name]; CP that goes from RT to LT side x5 months; no cough spits up white mucus mainly at night; SOB w/activity.  every night when lies down has immediate choking> coughing x months Walking at [facility] ok, can't rake leaves  X 5 min or do [employer] s giving out due to sob  Confused with all the meds he's taking for gerd "they don't work" Cp is paroxysmal, more common when lies down p a big meal, some better on gerd rx, migrates across ant chest R to L. No pleuritic component.  rec Stop lisinopril Start avapro (valsartan) 160 mg daily  GERD diet Add pepcid ac 20 mg one at bedtime Call me with the name of the pill you are taking and I will tell you what to do with it > confirmed lisinopril  Please schedule a follow up office visit in 6 weeks, call sooner if needed with all active medicines with you     09/01/2014 f/u ov/Andre [name] re: cough gone, now cc sob  Chief Complaint  Patient presents with  . Acute Visit    Increased SOB and HA x 2 weeks.   cough is completely gone and lies flat ok/ no noct symptoms  Sob only with exertion like raking leaves, last did this ok 6 m ago Andre [name] tends to be in afternoon, never am's or waking him p hs  Not using gerd rx as rec rec Pantoprazole (protonix) 40 mg   Take 30-60 min before first meal of the day and Pepcid 20 mg one bedtime until return to office - this is the best way to tell whether stomach acid is contributing to your problem. GERD   Cut down on coffee and try  midrin one every 4 hours as needed for headache  Please remember to go to the lab  department downstairs for your tests - we will call you with the results when they are available. Please schedule a follow up office visit in 4 weeks, sooner if needed    Add did not go to lab     11/04/14 to ER with CP/ r/o MI    11/06/2014 acute  ov/Andre [name] re: new noct pattern nightly / cp and sob x since last ov never took gerd rx  Chief Complaint  Patient presents with  . Follow-up    [name] increased SOB, HA and cough since his last visit. He also c/o CP- wakes up him early every am and then he can not get back to sleep.   11 yards doe every time he tries, just about every night cough/ choking around 2 am / very easily confused with meds/ not sure really what he's taking Cp component is minimal and "like a stinging sensation" mid sternum just as common at rest as with exertion  rec Nortriptylline Take x 2 at bedtime Change the valsartan to valsartan-hct 160/25 Pantoprazole (protonix) 40 mg   Take  30-60  min before first meal of the day and Pepcid (famotidine)  20 mg one @  bedtime   GERD diet    02/09/2015 f/u ov/Andre [name] re: unexplaind doe / noct sob  Chief Complaint  Patient presents with  . Follow-up    [name] CP and HA on and off x 3 wks. He states not coughing much, but bringing up alot of white sputum.  He describes CP as dull and it is worse after he eats.    improved p last ov but continued to have sleep disruption every night never at hs or when wakes up at 7 am at usual hour, much    worse x 3 weeks Still smoking/ doe x 150 ft. Says brought meds but has more at home - does not have ppi or nortriptylline nor pepcid with him - very easily confused with details of medical care >> Add  chlortrimeton (chlorpheniramine) 4 mg  X 2 at bedtime to pepcid 20 mg each available at bedtime Continue Pantoprazole (protonix) 40 mg   Take  30-60 min before first meal of the day   GERD   If not better in 2  weeks, return with all active medications in hand      03/19/2015 Acute OV :  Pt presents for an acute office visit  Complains of chest tightness, prod cough with white mucus, and increased SOB x 2 weeks. Denies any sinus pressure/drainage, fever, nausea or vomitting. No discolored mucus or fever.  Went to ER x 2 this week for chest pain. Cardiac enzymes were neg x 3 . CXR w/  No acute process x 2  CBC and BMET ok.  Previous cardiac workup showed normal [name] cath in 2013 and reported  myoview in 2015 that normal.  Had not smoked for 3 weeks.  Migratory chest pain and soreness in anterior chest wall. No rash or known trauma.  Drinks etoh daily .  Denies drug use.  Restart Protonix '40mg'$ .daily before meal .  Restart Pepcid '20mg'$  At bedtime  -this is over the counter.  Mucinex DM Twice daily As needed  Cough/congestion  Prednisone taper over next week.  Follow up Dr. Melvyn [name]  In 4 weeks and As needed   Warm heat to chest wall .  Tylenol '325mg'$  1 tab every 8hr as needed for pain.     04/08/2015   Extended transition of care/ f/u ov/Andre [name] re: unexplained sob/ abd and chest pain > 1 year/ multple er eval's neg  Chief Complaint  Patient presents with  . Follow-up    Seen in ED twice in past 8 days - c/o chest and stomach discomfort - Spitting up alot of white mucus - sob at times with exertion - PT denies cough  Brought all meds but no h1 and h2 in bag, no amitrytilline  Cough is worse at hs "but it's not really a cough/ mucus just comes flying out"   Pain is migratory from top of R chest to pelvic area made worse by coughing Very confused again with details of care but clear the  carafate nor the prednisone helping so far rec Stop carafate and prednisone Pantoprazole (protonix) 40 mg   Take  30-60 min before first meal of the day and Pepcid (famotidine)  20 mg one @  bedtime plus chlorpheniramine 4 mg 1-2 at bedtime until return to office Please see patient coordinator before you leave today   to schedule for diagnostic esophagram> neg gerd/ pos presbyesophagus Please schedule a follow  up office visit in 4 weeks, sooner if needed with all active  meds in hand    05/07/2015  f/u ov/Andre [name] re: chronic cough / no meds  Chief Complaint  Patient presents with  . Follow-up    Pt here for 4 week f/u after esophagram. Pt states his CP has slightly improved. [name] prod cough with white mucus at night x 2 weeks. Pt also c/o slight SOB with activity.   cp improved / cough mostly when lying down  rec Best cough medication is mucinex dm 1200 every 12 hours as needed Pantoprazole (protonix) 40 mg   Take  30-60 min before first meal of the day and Pepcid (famotidine)  20 mg one @  bedtime  And For drainage / throat tickle try take CHLORPHENIRAMINE  4 mg - take one every 4 hours as needed - available over the counter- may cause drowsiness so start with just a bedtime dose or two and see how you tolerate it before trying in daytime   GERD   Congratulations on not smoking - keep it up  If not satisfied return to clinic with all active medications in hand including your over the counter medications    12/21/2015  f/u ov/Andre [name] re: resumed smoking/ recurrent atypical cp off gerd rx/ hbp off diovan/ on lopressor but confused with meds/ did not bring them as rec  Chief Complaint  Patient presents with  . Follow-up    Pt states not coughing much "but bringing up alot of yellow phlem". He c/o minimal SOB and occ "burning in chest"- comes and goes.   cp avg every other day can last up to half day/ x one month recurrent  While off gerd rx ? flares Due to spicy foods,  located anterior mid chest  not lateralizing at all and not pleuritic no assoc n or v or diaph and not worse with activity   No obvious day to day or daytime variability or assoc  chest tightness, subjective wheeze or overt sinus or hb symptoms. No unusual exp hx or h/o childhood pna/ asthma or knowledge of premature birth.   Also denies any  obvious fluctuation of symptoms with weather or environmental changes or other aggravating or alleviating factors except as outlined above   Current Medications, Allergies, Complete Past Medical History, Past Surgical History, Family History, and Social History were reviewed in [employer] record.  ROS  The following are not active complaints unless bolded sore throat, dysphagia, dental problems, itching, sneezing,  nasal congestion or excess/ purulent secretions, ear ache,   fever, chills, sweats, unintended wt loss, classically pleuritic or exertional cp, hemoptysis,  orthopnea pnd or leg swelling, presyncope, palpitations, abdominal pain, anorexia, nausea, vomiting, diarrhea  or change in bowel or bladder habits, change in stools or urine, dysuria,hematuria,  rash, arthralgias, visual complaints, headache, numbness, weakness or ataxia or problems with walking or coordination,  change in mood/affect or memory.              Objective:   Physical Exam  amb  bm nad  / min rattling of FVC  11/06/14    211 >    02/09/2015   206> 206 03/19/2015 > 04/08/2015  201 >  05/07/2015 208 > 12/21/2015  203    Vital signs reviewed/ note bp ok one week off ARB    HEENT: nl dentition, turbinates, and orophanx. Nl external ear canals without cough reflex   NECK :  without JVD/Nodes/TM/ nl carotid  upstrokes bilaterally   LUNGS: no acc muscle use, clear to A and P bilaterally without cough on insp or exp maneuvers   CV:  RRR  no s3 or murmur or increase in P2, no edema   ABD:  soft and nontender with nl excursion in the supine position. No bruits or organomegaly, bowel sounds nl  MS:  warm without deformities, calf tenderness, cyanosis or clubbing  SKIN: warm and dry without lesions    NEURO:  alert, approp, no deficits        I personally reviewed images and agree with radiology impression as follows:  CTa  10/20/15 1. Negative for pulmonary embolism or other acute  finding. 2. Left basilar opacity on previous chest x-ray is atelectasis. 3. 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    LHC   10/18/15   Nonobstructive CAD.  Mildly elevated LVEDP.  LV was not injected due to tortuosity in the radial and subclavian.  If cath was needed in the future, would not use right radial approach.  Continue aggressive preventive therapy.                Assessment:

## 2015-12-27 NOTE — Assessment & Plan Note (Signed)
Not well controlled related to poor adherence and confusion re med names/ refills etc >  Follow up per Primary Care planned

## 2015-12-27 NOTE — Assessment & Plan Note (Signed)
-   MRI Brain 08/29/13 > nl sinuses  - trial off acei 08/10/2014 > improved 09/01/14  - restarted hs h2 h1 04/08/2015 >>>  - Dg Es 04/15/2015 >  1. Mild presbyesophagus. 2. No evidence of esophageal mass, ulceration or stricture. 3. No significant reflux  Cough flared off gerd rx plus has mc dysfunction (see copd 0) rec max rx for gerd/ f/u GI prn

## 2015-12-27 NOTE — Assessment & Plan Note (Signed)
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    Please see instructions for details which were reviewed in writing and the patient given a copy highlighting the part that I personally wrote and discussed at today's ov.

## 2015-12-27 NOTE — Assessment & Plan Note (Signed)
>   3 min Discussed the risks and costs (both direct and indirect)  of smoking relative to the benefits of quitting but patient unwilling to commit at this point to a specific quit date.    Although I don't endorse regular use of e cigs/ many pts find them helpful; however, I emphasized they should be considered a "one-way bridge" off all tobacco products.  

## 2015-12-30 ENCOUNTER — Telehealth: Payer: Self-pay | Admitting: Internal Medicine

## 2015-12-30 NOTE — Telephone Encounter (Signed)
Called spoke with pt. He states he is requesting a refill on his blood pressure. I explained to him that MW does not prescribe his and that he will need to contact Dr. Marilynne Halsted office. He voiced understanding and had no further questions.

## 2015-12-31 ENCOUNTER — Other Ambulatory Visit: Payer: Self-pay | Admitting: Internal Medicine

## 2016-01-05 ENCOUNTER — Other Ambulatory Visit: Payer: Self-pay | Admitting: Internal Medicine

## 2016-01-05 MED ORDER — VALSARTAN-HYDROCHLOROTHIAZIDE 160-25 MG PO TABS: 1.0000 | ORAL_TABLET | Freq: Every day | ORAL | 1 refills | Status: DC

## 2016-01-06 ENCOUNTER — Encounter (HOSPITAL_COMMUNITY): Payer: Self-pay | Admitting: Emergency Medicine

## 2016-01-06 ENCOUNTER — Emergency Department (HOSPITAL_COMMUNITY)
Admission: EM | Admit: 2016-01-06 | Discharge: 2016-01-06 | Disposition: A | Discharge status: Home / Self Care | Payer: Medicare Other | Attending: Emergency Medicine | Admitting: Emergency Medicine

## 2016-01-06 ENCOUNTER — Emergency Department (HOSPITAL_COMMUNITY): Payer: Medicare Other

## 2016-01-06 DIAGNOSIS — Z8673 Personal history of transient ischemic attack (TIA), and cerebral infarction without residual deficits: Secondary | ICD-10-CM | POA: Diagnosis not present

## 2016-01-06 DIAGNOSIS — K219 Gastro-esophageal reflux disease without esophagitis: Secondary | ICD-10-CM

## 2016-01-06 DIAGNOSIS — Z85048 Personal history of other malignant neoplasm of rectum, rectosigmoid junction, and anus: Secondary | ICD-10-CM | POA: Insufficient documentation

## 2016-01-06 DIAGNOSIS — I10 Essential (primary) hypertension: Secondary | ICD-10-CM | POA: Diagnosis not present

## 2016-01-06 DIAGNOSIS — R079 Chest pain, unspecified: Secondary | ICD-10-CM | POA: Diagnosis not present

## 2016-01-06 DIAGNOSIS — F17210 Nicotine dependence, cigarettes, uncomplicated: Secondary | ICD-10-CM | POA: Insufficient documentation

## 2016-01-06 DIAGNOSIS — R0789 Other chest pain: Secondary | ICD-10-CM | POA: Diagnosis not present

## 2016-01-06 DIAGNOSIS — I2510 Atherosclerotic heart disease of native coronary artery without angina pectoris: Secondary | ICD-10-CM | POA: Insufficient documentation

## 2016-01-06 DIAGNOSIS — Z79899 Other long term (current) drug therapy: Secondary | ICD-10-CM | POA: Insufficient documentation

## 2016-01-06 DIAGNOSIS — R12 Heartburn: Secondary | ICD-10-CM | POA: Diagnosis present

## 2016-01-06 DIAGNOSIS — Z7982 Long term (current) use of aspirin: Secondary | ICD-10-CM | POA: Insufficient documentation

## 2016-01-06 LAB — COMPREHENSIVE METABOLIC PANEL
ALBUMIN: 4.1 g/dL (ref 3.5–5.0)
ALK PHOS: 73 U/L (ref 38–126)
ALT: 24 U/L (ref 17–63)
ANION GAP: 6 (ref 5–15)
AST: 40 U/L (ref 15–41)
BILIRUBIN TOTAL: 1.4 mg/dL — AB (ref 0.3–1.2)
BUN: 6 mg/dL (ref 6–20)
CALCIUM: 9 mg/dL (ref 8.9–10.3)
CO2: 28 mmol/L (ref 22–32)
Chloride: 104 mmol/L (ref 101–111)
Creatinine, Ser: 0.67 mg/dL (ref 0.61–1.24)
GFR calc non Af Amer: >60 mL/min (ref >60)
GLUCOSE: 106 mg/dL — AB (ref 65–99)
POTASSIUM: 4.1 mmol/L (ref 3.5–5.1)
SODIUM: 138 mmol/L (ref 135–145)
TOTAL PROTEIN: 7.3 g/dL (ref 6.5–8.1)

## 2016-01-06 LAB — TROPONIN I: Troponin I: <0.03 ng/mL (ref <0.03)

## 2016-01-06 LAB — CBC WITH DIFFERENTIAL/PLATELET
BASOS ABS: 0 10*3/uL (ref 0.0–0.1)
BASOS PCT: 0 %
EOS ABS: 0.2 10*3/uL (ref 0.0–0.7)
Eosinophils Relative: 4 %
HEMATOCRIT: 45 % (ref 39.0–52.0)
HEMOGLOBIN: 14.9 g/dL (ref 13.0–17.0)
Lymphocytes Relative: 47 %
Lymphs Abs: 2.7 10*3/uL (ref 0.7–4.0)
MCH: 31.6 pg (ref 26.0–34.0)
MCHC: 33.1 g/dL (ref 30.0–36.0)
MCV: 95.5 fL (ref 78.0–100.0)
MONOS PCT: 10 %
Monocytes Absolute: 0.6 10*3/uL (ref 0.1–1.0)
NEUTROS ABS: 2.2 10*3/uL (ref 1.7–7.7)
NEUTROS PCT: 39 %
Platelets: 235 10*3/uL (ref 150–400)
RBC: 4.71 MIL/uL (ref 4.22–5.81)
RDW: 15.2 % (ref 11.5–15.5)
WBC: 5.6 10*3/uL (ref 4.0–10.5)

## 2016-01-06 LAB — LIPASE, BLOOD: LIPASE: 17 U/L (ref 11–51)

## 2016-01-06 MED ORDER — ACETAMINOPHEN 325 MG PO TABS
650.0000 mg | ORAL_TABLET | Freq: Once | ORAL | Status: AC
  Administered 2016-01-06: 650 mg via ORAL
  Filled 2016-01-06: qty 2

## 2016-01-06 MED ORDER — GI COCKTAIL ~~LOC~~
30.0000 mL | Freq: Once | ORAL | Status: AC
  Administered 2016-01-06: 30 mL via ORAL
  Filled 2016-01-06: qty 30

## 2016-01-06 MED ORDER — PANTOPRAZOLE SODIUM 40 MG PO TBEC
40.0000 mg | DELAYED_RELEASE_TABLET | Freq: Once | ORAL | Status: AC
  Administered 2016-01-06: 40 mg via ORAL
  Filled 2016-01-06: qty 1

## 2016-01-06 MED ORDER — OMEPRAZOLE 20 MG PO CPDR: 20.0000 mg | DELAYED_RELEASE_CAPSULE | Freq: Two times a day (BID) | ORAL | 0 refills | Status: DC

## 2016-01-06 NOTE — ED Triage Notes (Signed)
Patient complaining of burning in the chest for a month. Patient states it got worser this morning and that is why he came in.

## 2016-01-06 NOTE — ED Provider Notes (Signed)
[facility] DEPT Provider Note   CSN: [id number] Arrival date & time: 01/06/16  [id number]  First Provider Contact:  First MD Initiated Contact with Patient 01/06/16 [phone]        History   Chief Complaint Chief Complaint  Patient presents with  . Heartburn    HPI [name] is a 75 y.o. male.  The history is provided by the patient. No language interpreter was used.  Heartburn    Andre [name] is a 75 y.o. male who presents to the Emergency Department complaining of chest pain.  He reports burning chest pain, across the entire chest for the last 1.5 months.  This morning at 130 am his pain worsened significantly.  Sxs are constant in nature with no clear alleviating or worsening factors.  He has mild associated epigastric discomfort, coughs occasional sputum.  No fever, chills, diaphoresis, diarrhea, leg swelling.    Past Medical History:  Diagnosis Date  . Arthritis   . Coronary artery disease, non-occlusive    a. 06/2011: cath for abnormal exercise echo: Only 30-40% proximal RCA. Otherwise normal. b. 10/2015: cath for abnormal NST and atypical CP --> showed mild 30-40% stenosis in the prox-distal RCA  . GERD (gastroesophageal reflux disease)   . HA (headache)   . Hypertension   . 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[place])   . TIA (transient ischemic attack)   . Weakness     Patient Active Problem List   Diagnosis Date Noted  . Abnormal stress test 10/18/2015  . Chest pain with moderate risk for cardiac etiology 09/30/2015  . Erectile dysfunction 02/25/2015  . Cigarette smoker 02/10/2015  . COPD GOLD 0 11/08/2014  . Upper airway cough syndrome 08/10/2014  . Hemorrhoids 02/11/2014  . Constipation 05/30/2013  . 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 Headache 10/23/2011  . 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[place]) 10/23/2011  . GERD (gastroesophageal reflux disease) 10/23/2011  . Nicotine addiction 10/23/2011  . 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 Cervical stenosis of spinal canal 10/23/2011  . Essential  hypertension, benign 07/10/2011    Past Surgical History:  Procedure Laterality Date  . CARDIAC CATHETERIZATION  January 2013   Proximal RCA 30-40%. Otherwise normal.  . CARDIAC CATHETERIZATION N/A 10/18/2015   Procedure: Left Heart Cath and Coronary Angiography;  Surgeon: Jettie Booze, MD;  Location: [facility];  Service: Cardiovascular;  Laterality: N/A;  . NM MYOVIEW LTD  March 2015   LOW RISK. No scar or ischemia. Normal EF (55%). No RWMA  . surgical excision of rectal cancer         Home Medications    Prior to Admission medications   Medication Sig Start Date End Date Taking? Authorizing Provider  acetaminophen (TYLENOL) 500 MG tablet Take 500 mg by mouth every 6 (six) hours as needed for headache. Reported on 09/13/2015   Yes Historical Provider, MD  aspirin EC 81 MG tablet Take 81 mg by mouth daily.   Yes Historical Provider, MD  atorvastatin (LIPITOR) 40 MG tablet Take 1 tablet (40 mg total) by mouth daily at 6 PM. 10/18/15  Yes Erma Heritage, PA  famotidine (PEPCID) 20 MG tablet One at bedtime Patient taking differently: Take 20 mg by mouth daily as needed for heartburn. One at bedtime 12/21/15  Yes Tanda Rockers, MD  nitroGLYCERIN (NITROSTAT) 0.4 MG SL tablet Place 1 tablet (0.4 mg total) under the tongue every 5 (five) minutes x 3 doses as needed for chest pain. 10/18/15  Yes Erma Heritage, [place]  pantoprazole (PROTONIX) 40 MG tablet Take 1 tablet (40 mg total) by mouth daily. Take 30-60 min before first meal of the day 12/21/15  Yes Tanda Rockers, MD  valsartan-hydrochlorothiazide (DIOVAN HCT) 160-25 MG tablet Take 1 tablet by mouth daily. 01/05/16  Yes Tanda Rockers, MD  metoprolol tartrate (LOPRESSOR) 25 MG tablet Take 1 tablet (25 mg total) by mouth 2 (two) times daily. Patient not taking: Reported on 01/06/2016 10/18/15   Erma Heritage, PA  omeprazole (PRILOSEC) 20 MG capsule Take 1 capsule (20 mg total) by mouth 2 (two) times daily before a meal.  01/06/16   Quintella Reichert, MD  traMADol (ULTRAM) 50 MG tablet Take 1 tablet (50 mg total) by mouth every 6 (six) hours as needed. Patient not taking: Reported on 01/06/2016 10/20/15   Leonard Schwartz, MD    Family History Family History  Problem Relation Age of Onset  . Hypertension Mother   . Cancer Mother   . Liver disease Father     Social History Social History  Substance Use Topics  . Smoking status: Current Some Day Smoker    Packs/day: 0.30    Years: 47.00    Types: Cigarettes  . Smokeless tobacco: Never Used  . Alcohol use 0.0 oz/week     Allergies   Review of patient's allergies indicates no known allergies.   Review of Systems Review of Systems  Gastrointestinal: Positive for heartburn.  All other systems reviewed and are negative.    Physical Exam Updated Vital Signs BP 186/85   Pulse (!) 41   Temp 97.7 F (36.5 C) (Oral)   Resp 17   Ht '5\' 11"'$  (1.803 m)   Wt 210 lb (95.3 kg)   SpO2 100%   BMI 29.29 kg/m   Physical Exam  Constitutional: He is oriented to person, place, and time. He appears well-developed and well-nourished.  HENT:  Head: Normocephalic and atraumatic.  Cardiovascular: Normal rate and regular rhythm.   No murmur heard. Pulmonary/Chest: Effort normal and breath sounds normal. No respiratory distress.  Abdominal: Soft. There is no tenderness. There is no rebound and no guarding.  Musculoskeletal: He exhibits no edema or tenderness.  Neurological: He is alert and oriented to person, place, and time.  Skin: Skin is warm and dry.  Psychiatric: He has a normal mood and affect. His behavior is normal.  Nursing note and vitals reviewed.    ED Treatments / Results  Labs (all labs ordered are listed, but only abnormal results are displayed) Labs Reviewed  COMPREHENSIVE METABOLIC PANEL - Abnormal; Notable for the following:       Result Value   Glucose, Bld 106 (*)    Total Bilirubin 1.4 (*)    All other components within normal limits   TROPONIN I  CBC WITH DIFFERENTIAL/PLATELET  LIPASE, BLOOD  TROPONIN I    EKG  EKG Interpretation None       Radiology Dg Chest 2 View  Result Date: 01/06/2016 CLINICAL DATA:  Chest pain for 1 day EXAM: CHEST  2 VIEW COMPARISON:  Chest radiograph Oct 19, 2015 and chest CT Oct 20, 2015 FINDINGS: There is mild chronic atelectasis in the left base region. Lungs elsewhere are clear. Heart is upper limits normal in size with pulmonary vascularity within normal limits. No adenopathy. There is diffuse idiopathic skeletal hyperostosis in the thoracic spine. IMPRESSION: Chronic atelectatic change left base. Lungs elsewhere clear. Stable cardiac silhouette. Electronically Signed   By: Lowella Grip III M.D.  On: 01/06/2016 07:57   Procedures Procedures (including critical care time)  Medications Ordered in ED Medications  gi cocktail (Maalox,Lidocaine,Donnatal) (30 mLs Oral Given 01/06/16 0836)  pantoprazole (PROTONIX) EC tablet 40 mg (40 mg Oral Given 01/06/16 0930)  acetaminophen (TYLENOL) tablet 650 mg (650 mg Oral Given 01/06/16 1206)     Initial Impression / Assessment and Plan / ED Course  I have reviewed the triage vital signs and the nursing notes.  Pertinent labs & imaging results that were available during my care of the patient were reviewed by me and considered in my medical decision making (see chart for details).  Clinical Course  Patient here for evaluation of chest pain that is been ongoing for a month. His pain did worsen today and is described as a burning sensation. Symptoms improved following GI cocktail in the emergency department. Presentation is not c/w ACS, dissection, PE, pneumonia. He has been out of his PPI for the last 2 weeks. We will treat for reflux with close outpatient follow-up. Home care and return precautions discussed.  Final Clinical Impressions(s) / ED Diagnoses   Final diagnoses:  Atypical chest pain  Gastroesophageal reflux disease without  esophagitis    New Prescriptions Discharge Medication List as of 01/06/2016 12:49 PM    START taking these medications   Details  omeprazole (PRILOSEC) 20 MG capsule Take 1 capsule (20 mg total) by mouth 2 (two) times daily before a meal., Starting Thu 01/06/2016, Print         Quintella Reichert, MD 01/07/16 [phone]

## 2016-02-06 ENCOUNTER — Other Ambulatory Visit: Payer: Self-pay | Admitting: Internal Medicine

## 2016-02-08 ENCOUNTER — Ambulatory Visit (INDEPENDENT_AMBULATORY_CARE_PROVIDER_SITE_OTHER): Payer: Medicare Other | Admitting: Family

## 2016-02-08 ENCOUNTER — Encounter: Payer: Self-pay | Admitting: Family

## 2016-02-08 VITALS — BP 130/80 | HR 60 | Temp 97.6°F | Resp 16 | Ht 71.0 in | Wt 199.0 lb

## 2016-02-08 DIAGNOSIS — K219 Gastro-esophageal reflux disease without esophagitis: Secondary | ICD-10-CM | POA: Diagnosis not present

## 2016-02-08 DIAGNOSIS — R51 Headache: Secondary | ICD-10-CM | POA: Diagnosis not present

## 2016-02-08 DIAGNOSIS — G8929 Other chronic pain: Secondary | ICD-10-CM

## 2016-02-08 DIAGNOSIS — I10 Essential (primary) hypertension: Secondary | ICD-10-CM

## 2016-02-08 MED ORDER — PANTOPRAZOLE SODIUM 40 MG PO TBEC: 40.0000 mg | DELAYED_RELEASE_TABLET | Freq: Every day | ORAL | 2 refills | Status: DC

## 2016-02-08 MED ORDER — VALSARTAN-HYDROCHLOROTHIAZIDE 160-25 MG PO TABS: 1.0000 | ORAL_TABLET | Freq: Every day | ORAL | 0 refills | Status: DC

## 2016-02-08 MED ORDER — METOPROLOL SUCCINATE ER 25 MG PO TB24: 25.0000 mg | ORAL_TABLET | Freq: Every day | ORAL | 3 refills | Status: DC

## 2016-02-08 MED ORDER — SUMATRIPTAN SUCCINATE 50 MG PO TABS: ORAL_TABLET | ORAL | 0 refills | Status: DC

## 2016-02-08 NOTE — Assessment & Plan Note (Signed)
Symptoms and exam consistent with gastroesophageal reflux previously managed with a proton pump inhibitor. There is mild concern for developing gastric ulcer secondary to increased usage of nonsteroidal anti-inflammatories mainly getting better. Encouraged to decrease Katie powder use and use Tylenol as needed for headaches.

## 2016-02-08 NOTE — Progress Notes (Signed)
Subjective:    Patient ID: Andre [name], male    DOB: [birth year], 75 y.o.   MRN: [id number]  Chief Complaint  Patient presents with  . Establish Care    states he has been having a burning sensation in his chest with headaches    HPI:  Andre [name] is a 75 y.o. male who  has a past medical history of Arthritis; Coronary artery disease, non-occlusive; GERD (gastroesophageal reflux disease); HA (headache); Hypertension; 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[place]); TIA (transient ischemic attack); and Weakness. and presents today for an office visit to establish care.   1.) Burning chest pain - Previously evaluated in the ED with concerns of burning chest pain. Presentation was not consistent with ACS and symptoms were improved with the GI cocktail. He was also prescribed Protonix. Reports taking the medication as prescribed and ran out of the medication about 3-4 days ago unaware of available refills. Continues to experience a chest pain that is described and burning and waxes and wanes. Aggravated by greasy foods and tries to sit up after meals. No pain in arm/jaw or shortness of breath.  2.) Headaches - This is a chronic problem. Associated symptom of a headache has been going on for about 2 months. Frequency of the headaches wax and wane. Pain stays for about 10-15 minutes. Located in his forehead and described as sharp. Denies any sensitivity to light or sound without nausea or vomiting. Modifying factors include [place] which does help with his symptoms. Previous MRI of the brain in 08/29/13 was normal.  the  3.) Hypertension - Currently maintained on valsartan-hydrochlorothiazide. Reports taken the medication as prescribed and denies adverse side effects or worse headache of life. No hypotensive readings. Does not currently check his blood pressure at home. Previously maintained on metoprolol which is not currently taking as he does have confusion. the  BP Readings from Last 3 Encounters:  02/08/16 130/80    01/06/16 186/85  12/21/15 140/78     No Known Allergies    Outpatient Medications Prior to Visit  Medication Sig Dispense Refill  . acetaminophen (TYLENOL) 500 MG tablet Take 500 mg by mouth every 6 (six) hours as needed for headache. Reported on 09/13/2015    . aspirin EC 81 MG tablet Take 81 mg by mouth daily.    . nitroGLYCERIN (NITROSTAT) 0.4 MG SL tablet Place 1 tablet (0.4 mg total) under the tongue every 5 (five) minutes x 3 doses as needed for chest pain. 25 tablet 3  . atorvastatin (LIPITOR) 40 MG tablet Take 1 tablet (40 mg total) by mouth daily at 6 PM. 30 tablet 6  . famotidine (PEPCID) 20 MG tablet One at bedtime (Patient taking differently: Take 20 mg by mouth daily as needed for heartburn. One at bedtime) 30 tablet 2  . metoprolol tartrate (LOPRESSOR) 25 MG tablet Take 1 tablet (25 mg total) by mouth 2 (two) times daily. 60 tablet 6  . omeprazole (PRILOSEC) 20 MG capsule Take 1 capsule (20 mg total) by mouth 2 (two) times daily before a meal. 30 capsule 0  . pantoprazole (PROTONIX) 40 MG tablet Take 1 tablet (40 mg total) by mouth daily. Take 30-60 min before first meal of the day 30 tablet 2  . traMADol (ULTRAM) 50 MG tablet Take 1 tablet (50 mg total) by mouth every 6 (six) hours as needed. 15 tablet 0  . valsartan-hydrochlorothiazide (DIOVAN HCT) 160-25 MG tablet Take 1 tablet by mouth daily. 30 tablet 1   No facility-administered medications  prior to visit.       Past Surgical History:  Procedure Laterality Date  . CARDIAC CATHETERIZATION  January 2013   Proximal RCA 30-40%. Otherwise normal.  . CARDIAC CATHETERIZATION N/A 10/18/2015   Procedure: Left Heart Cath and Coronary Angiography;  Surgeon: Jettie Booze, MD;  Location: [facility];  Service: Cardiovascular;  Laterality: N/A;  . NM MYOVIEW LTD  March 2015   LOW RISK. No scar or ischemia. Normal EF (55%). No RWMA  . surgical excision of rectal cancer        Past Medical History:  Diagnosis  Date  . Arthritis   . Coronary artery disease, non-occlusive    a. 06/2011: cath for abnormal exercise echo: Only 30-40% proximal RCA. Otherwise normal. b. 10/2015: cath for abnormal NST and atypical CP --> showed mild 30-40% stenosis in the prox-distal RCA  . GERD (gastroesophageal reflux disease)   . HA (headache)   . Hypertension   . 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[place])   . TIA (transient ischemic attack)   . Weakness       Review of Systems  Constitutional: Negative for chills and fever.  Respiratory: Negative for chest tightness and shortness of breath.   Cardiovascular: Positive for chest pain (burning chest pain). Negative for palpitations and leg swelling.  Gastrointestinal: Negative for abdominal pain, blood in stool, constipation, diarrhea, nausea and vomiting.      Objective:    BP 130/80 (BP Location: Left Arm, Patient Position: Sitting, Cuff Size: Normal)   Pulse 60   Temp 97.6 F (36.4 C) (Oral)   Resp 16   Ht '5\' 11"'$  (1.803 m)   Wt 199 lb (90.3 kg)   SpO2 98%   BMI 27.75 kg/m  Nursing note and vital signs reviewed.  Physical Exam  Constitutional: He is oriented to person, place, and time. He appears well-developed and well-nourished. No distress.  HENT:  Right Ear: Hearing, tympanic membrane, external ear and ear canal normal.  Left Ear: Hearing, tympanic membrane, external ear and ear canal normal.  Nose: Nose normal.  Mouth/Throat: Uvula is midline, oropharynx is clear and moist and mucous membranes are normal.  Eyes:  Arcus senilis noted.  Cardiovascular: Normal rate, regular rhythm, normal heart sounds and intact distal pulses.   Pulmonary/Chest: Effort normal and breath sounds normal.  Abdominal: Soft. Bowel sounds are normal. He exhibits no distension and no mass. There is no tenderness. There is no rebound and no guarding.  Neurological: He is alert and oriented to person, place, and time.  Skin: Skin is warm and dry.  Psychiatric: He has a normal mood and affect.  His behavior is normal. Judgment and thought content normal.       Assessment & Plan:   Problem List Items Addressed This Visit      Cardiovascular and Mediastinum   Essential hypertension, benign (Chronic)    Hypertension appears adequately controlled and below goal 140/90 with current regimen. Continue current dosage of valsartan-hydrochlorothiazide and metoprolol. Encouraged to monitor blood pressure at home. Follow low-sodium diet.      Relevant Medications   valsartan-hydrochlorothiazide (DIOVAN HCT) 160-25 MG tablet   metoprolol succinate (TOPROL-XL) 25 MG 24 hr tablet     Digestive   GERD (gastroesophageal reflux disease) - Primary    Symptoms and exam consistent with gastroesophageal reflux previously managed with a proton pump inhibitor. There is mild concern for developing gastric ulcer secondary to increased usage of nonsteroidal anti-inflammatories mainly getting better. Encouraged to decrease Katie powder  use and use Tylenol as needed for headaches.      Relevant Medications   pantoprazole (PROTONIX) 40 MG tablet     Other   Headache    Previous MRI negative. Symptoms consistent with possible cluster headaches versus tension headaches. Treat conservatively with Tylenol first and avoid Goody's powder overusage. Start Imitrex as needed for headaches. Reviewed proper medication usage and side effects. Denies worse headache of life.      Relevant Medications   metoprolol succinate (TOPROL-XL) 25 MG 24 hr tablet   SUMAtriptan (IMITREX) 50 MG tablet    Other Visit Diagnoses   None.      I have discontinued Andre [name] atorvastatin, metoprolol tartrate, traMADol, famotidine, and omeprazole. I am also having him start on metoprolol succinate and SUMAtriptan. Additionally, I am having him maintain his acetaminophen, nitroGLYCERIN, aspirin EC, valsartan-hydrochlorothiazide, and pantoprazole.   Meds ordered this encounter  Medications  . valsartan-hydrochlorothiazide  (DIOVAN HCT) 160-25 MG tablet    Sig: Take 1 tablet by mouth daily.    Dispense:  90 tablet    Refill:  0    Order Specific Question:   Supervising Provider    Answer:   Pricilla Holm A [[id number]]  . metoprolol succinate (TOPROL-XL) 25 MG 24 hr tablet    Sig: Take 1 tablet (25 mg total) by mouth daily.    Dispense:  90 tablet    Refill:  3    Order Specific Question:   Supervising Provider    Answer:   Pricilla Holm A [[id number]]  . pantoprazole (PROTONIX) 40 MG tablet    Sig: Take 1 tablet (40 mg total) by mouth daily. Take 30-60 min before first meal of the day    Dispense:  30 tablet    Refill:  2    Order Specific Question:   Supervising Provider    Answer:   Pricilla Holm A [[id number]]  . SUMAtriptan (IMITREX) 50 MG tablet    Sig: Take 1 at the onset of a headache. May repeat in 2 hours if headache persists or recurs.    Dispense:  10 tablet    Refill:  0    Order Specific Question:   Supervising Provider    Answer:   Pricilla Holm A [[id number]]     Follow-up: Return in about 1 month (around 03/10/2016), or if symptoms worsen or fail to improve.  Mauricio Po, FNP

## 2016-02-08 NOTE — Patient Instructions (Signed)
Thank you for choosing [facility].  SUMMARY AND INSTRUCTIONS:  Medication:  Please start taking the metoprolol and valsartan-hydrochlorothiazide daily.  Start taking the pantoprazole for your stomach on a daily basis.  Use the sumatriptan as needed for headaches. No more than 3 pills in 1 day. May also use Tyleonl - No more than 3000 mg per day. Limit the Goody's powder to prevent an ulcer.  Your prescription(s) have been submitted to your pharmacy or been printed and provided for you. Please take as directed and contact our office if you believe you are having problem(s) with the medication(s) or have any questions.  Follow up:  If your symptoms worsen or fail to improve, please contact our office for further instruction, or in case of emergency go directly to the emergency room at the closest medical facility.   Food Choices for Gastroesophageal Reflux Disease, Adult When you have gastroesophageal reflux disease (GERD), the foods you eat and your eating habits are very important. Choosing the right foods can help ease the discomfort of GERD. WHAT GENERAL GUIDELINES DO I NEED TO FOLLOW?  Choose fruits, vegetables, whole grains, low-fat dairy products, and low-fat meat, fish, and poultry.  Limit fats such as oils, salad dressings, butter, nuts, and avocado.  Keep a food diary to identify foods that cause symptoms.  Avoid foods that cause reflux. These may be different for different people.  Eat frequent small meals instead of three large meals each day.  Eat your meals slowly, in a relaxed setting.  Limit fried foods.  Cook foods using methods other than frying.  Avoid drinking alcohol.  Avoid drinking large amounts of liquids with your meals.  Avoid bending over or lying down until 2-3 hours after eating. WHAT FOODS ARE NOT RECOMMENDED? The following are some foods and drinks that may worsen your symptoms: Vegetables Tomatoes. Tomato juice. Tomato and spaghetti  sauce. Chili peppers. Onion and garlic. Horseradish. Fruits Oranges, grapefruit, and lemon (fruit and juice). Meats High-fat meats, fish, and poultry. This includes hot dogs, ribs, ham, sausage, salami, and bacon. Dairy Whole milk and chocolate milk. Sour cream. Cream. Butter. Ice cream. Cream cheese.  Beverages Coffee and tea, with or without caffeine. Carbonated beverages or energy drinks. Condiments Hot sauce. Barbecue sauce.  Sweets/Desserts Chocolate and cocoa. Donuts. Peppermint and spearmint. Fats and Oils High-fat foods, including [place] fries and potato chips. Other Vinegar. Strong spices, such as black pepper, white pepper, red pepper, cayenne, curry powder, cloves, ginger, and chili powder. The items listed above may not be a complete list of foods and beverages to avoid. Contact your dietitian for more information.   This information is not intended to replace advice given to you by your health care provider. Make sure you discuss any questions you have with your health care provider.   Document Released: 05/29/2005 Document Revised: 06/19/2014 Document Reviewed: 04/02/2013 Elsevier Interactive Patient Education [employer].

## 2016-02-08 NOTE — Assessment & Plan Note (Signed)
Hypertension appears adequately controlled and below goal 140/90 with current regimen. Continue current dosage of valsartan-hydrochlorothiazide and metoprolol. Encouraged to monitor blood pressure at home. Follow low-sodium diet.

## 2016-02-08 NOTE — Assessment & Plan Note (Signed)
Previous MRI negative. Symptoms consistent with possible cluster headaches versus tension headaches. Treat conservatively with Tylenol first and avoid Goody's powder overusage. Start Imitrex as needed for headaches. Reviewed proper medication usage and side effects. Denies worse headache of life.

## 2016-02-22 ENCOUNTER — Emergency Department (HOSPITAL_COMMUNITY)
Admission: EM | Admit: 2016-02-22 | Discharge: 2016-02-22 | Disposition: A | Discharge status: Home / Self Care | Payer: Medicare Other | Attending: Emergency Medicine | Admitting: Emergency Medicine

## 2016-02-22 ENCOUNTER — Encounter (HOSPITAL_COMMUNITY): Payer: Self-pay

## 2016-02-22 ENCOUNTER — Emergency Department (HOSPITAL_COMMUNITY): Payer: Medicare Other

## 2016-02-22 DIAGNOSIS — R079 Chest pain, unspecified: Secondary | ICD-10-CM

## 2016-02-22 DIAGNOSIS — I2510 Atherosclerotic heart disease of native coronary artery without angina pectoris: Secondary | ICD-10-CM | POA: Insufficient documentation

## 2016-02-22 DIAGNOSIS — I10 Essential (primary) hypertension: Secondary | ICD-10-CM | POA: Diagnosis not present

## 2016-02-22 DIAGNOSIS — R062 Wheezing: Secondary | ICD-10-CM | POA: Diagnosis not present

## 2016-02-22 DIAGNOSIS — R05 Cough: Secondary | ICD-10-CM | POA: Diagnosis not present

## 2016-02-22 DIAGNOSIS — Z8673 Personal history of transient ischemic attack (TIA), and cerebral infarction without residual deficits: Secondary | ICD-10-CM | POA: Diagnosis not present

## 2016-02-22 DIAGNOSIS — Z79891 Long term (current) use of opiate analgesic: Secondary | ICD-10-CM | POA: Insufficient documentation

## 2016-02-22 DIAGNOSIS — Z79899 Other long term (current) drug therapy: Secondary | ICD-10-CM | POA: Diagnosis not present

## 2016-02-22 DIAGNOSIS — Z7982 Long term (current) use of aspirin: Secondary | ICD-10-CM | POA: Diagnosis not present

## 2016-02-22 DIAGNOSIS — F17210 Nicotine dependence, cigarettes, uncomplicated: Secondary | ICD-10-CM | POA: Diagnosis not present

## 2016-02-22 DIAGNOSIS — J449 Chronic obstructive pulmonary disease, unspecified: Secondary | ICD-10-CM | POA: Insufficient documentation

## 2016-02-22 LAB — BASIC METABOLIC PANEL
Anion gap: 8 (ref 5–15)
BUN: 8 mg/dL (ref 6–20)
CALCIUM: 9.6 mg/dL (ref 8.9–10.3)
CO2: 29 mmol/L (ref 22–32)
CREATININE: 0.76 mg/dL (ref 0.61–1.24)
Chloride: 101 mmol/L (ref 101–111)
GLUCOSE: 102 mg/dL — AB (ref 65–99)
Potassium: 3.8 mmol/L (ref 3.5–5.1)
Sodium: 138 mmol/L (ref 135–145)

## 2016-02-22 LAB — CBC
HCT: 47 % (ref 39.0–52.0)
Hemoglobin: 16.1 g/dL (ref 13.0–17.0)
MCH: 31.9 pg (ref 26.0–34.0)
MCHC: 34.3 g/dL (ref 30.0–36.0)
MCV: 93.1 fL (ref 78.0–100.0)
PLATELETS: 198 10*3/uL (ref 150–400)
RBC: 5.05 MIL/uL (ref 4.22–5.81)
RDW: 15.2 % (ref 11.5–15.5)
WBC: 5.5 10*3/uL (ref 4.0–10.5)

## 2016-02-22 LAB — I-STAT TROPONIN, ED
TROPONIN I, POC: 0 ng/mL (ref 0.00–0.08)
Troponin i, poc: 0 ng/mL (ref 0.00–0.08)

## 2016-02-22 MED ORDER — OMEPRAZOLE 20 MG PO CPDR: 20.0000 mg | DELAYED_RELEASE_CAPSULE | Freq: Every day | ORAL | 0 refills | Status: DC

## 2016-02-22 MED ORDER — FAMOTIDINE IN NACL 20-0.9 MG/50ML-% IV SOLN
20.0000 mg | Freq: Once | INTRAVENOUS | Status: AC
  Administered 2016-02-22: 20 mg via INTRAVENOUS
  Filled 2016-02-22: qty 50

## 2016-02-22 MED ORDER — GI COCKTAIL ~~LOC~~
30.0000 mL | Freq: Once | ORAL | Status: AC
  Administered 2016-02-22: 30 mL via ORAL
  Filled 2016-02-22: qty 30

## 2016-02-22 NOTE — ED Provider Notes (Signed)
[facility] DEPT Provider Note   CSN: [id number] Arrival date & time: 02/22/16  1012     History   Chief Complaint Chief Complaint  Patient presents with  . Chest Pain    HPI Andre [name] is a 75 y.o. male.  Patient presents today with a chief complaint of chest pain.  He describes the pain as a "burning" pain.  Pain located across his chest and does not radiate.  He reports that the pain has been constant since 1 AM this morning.  He does report that the pain eases somewhat when he stands up and walks.  Pain is worse when lying down.  He states the pain is similar to the pain that he had when he was in the ED in July.  Review of the chart shows that the patient had a cardiac cath in May 2017, which showed nonobstructive CAD.  He denies cough, fever, chills, nausea, vomiting, dizziness, syncope, SOB, or any other symptoms at this time.  He has not taken anything for pain prior to arrival.        Past Medical History:  Diagnosis Date  . Arthritis   . Coronary artery disease, non-occlusive    a. 06/2011: cath for abnormal exercise echo: Only 30-40% proximal RCA. Otherwise normal. b. 10/2015: cath for abnormal NST and atypical CP --> showed mild 30-40% stenosis in the prox-distal RCA  . GERD (gastroesophageal reflux disease)   . HA (headache)   . Hypertension   . 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[place])   . TIA (transient ischemic attack)   . Weakness     Patient Active Problem List   Diagnosis Date Noted  . Abnormal stress test 10/18/2015  . Chest pain with moderate risk for cardiac etiology 09/30/2015  . Erectile dysfunction 02/25/2015  . Cigarette smoker 02/10/2015  . COPD GOLD 0 11/08/2014  . Upper airway cough syndrome 08/10/2014  . Hemorrhoids 02/11/2014  . Constipation 05/30/2013  . 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 Headache 10/23/2011  . 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[place]) 10/23/2011  . GERD (gastroesophageal reflux disease) 10/23/2011  . Nicotine addiction 10/23/2011  . 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 Cervical stenosis of spinal canal 10/23/2011  . Essential hypertension, benign 07/10/2011    Past Surgical History:  Procedure Laterality Date  . CARDIAC CATHETERIZATION  January 2013   Proximal RCA 30-40%. Otherwise normal.  . CARDIAC CATHETERIZATION N/A 10/18/2015   Procedure: Left Heart Cath and Coronary Angiography;  Surgeon: Jettie Booze, MD;  Location: [facility];  Service: Cardiovascular;  Laterality: N/A;  . NM MYOVIEW LTD  March 2015   LOW RISK. No scar or ischemia. Normal EF (55%). No RWMA  . surgical excision of rectal cancer         Home Medications    Prior to Admission medications   Medication Sig Start Date End Date Taking? Authorizing Provider  acetaminophen (TYLENOL) 500 MG tablet Take 500 mg by mouth daily.    Yes Historical Provider, MD  aspirin EC 81 MG tablet Take 81 mg by mouth daily.   Yes Historical Provider, MD  bismuth subsalicylate (PEPTO BISMOL) 262 MG/15ML suspension Take 30 mLs by mouth every 6 (six) hours as needed for indigestion (and/or GI upset).   Yes Historical Provider, MD  famotidine (PEPCID) 20 MG tablet Take 20 mg by mouth 2 (two) times daily.   Yes Historical Provider, MD  metoprolol succinate (TOPROL-XL) 25 MG 24 hr tablet Take 1 tablet (25 mg total) by mouth  daily. 02/08/16  Yes Golden Circle, FNP  nitroGLYCERIN (NITROSTAT) 0.4 MG SL tablet Place 1 tablet (0.4 mg total) under the tongue every 5 (five) minutes x 3 doses as needed for chest pain. 10/18/15  Yes Erma Heritage, PA  SUMAtriptan (IMITREX) 50 MG tablet Take 50 mg by mouth every 2 (two) hours as needed for migraine.   Yes Historical Provider, MD  valsartan-hydrochlorothiazide (DIOVAN HCT) 160-25 MG tablet Take 1 tablet by mouth daily. 02/08/16  Yes Golden Circle, FNP    Family History Family History  Problem Relation Age of Onset  . Hypertension Mother   . Cancer Mother   . Liver disease Father     Social History Social History    Substance Use Topics  . Smoking status: Current Some Day Smoker    Packs/day: 0.30    Years: 47.00    Types: Cigarettes  . Smokeless tobacco: Never Used  . Alcohol use 1.2 oz/week    2 Cans of beer per week     Allergies   Review of patient's allergies indicates no known allergies.   Review of Systems Review of Systems  All other systems reviewed and are negative.    Physical Exam Updated Vital Signs BP 128/73 (BP Location: Left Arm)   Pulse 63   Temp 98.6 F (37 C) (Oral)   Resp 16   SpO2 99%   Physical Exam  Constitutional: He appears well-developed and well-nourished.  HENT:  Head: Normocephalic and atraumatic.  Mouth/Throat: Oropharynx is clear and moist.  Neck: Normal range of motion. Neck supple.  Cardiovascular: Normal rate, regular rhythm and normal heart sounds.   Pulses:      Dorsalis pedis pulses are 2+ on the right side, and 2+ on the left side.  Pulmonary/Chest: Effort normal. No respiratory distress. He has wheezes. He has no rales. He exhibits no tenderness.  Slight expiratory wheeze at the bases of both lungs  Abdominal: Soft. Bowel sounds are normal. He exhibits no distension and no mass. There is no tenderness. There is no rebound and no guarding. No hernia.  Musculoskeletal: Normal range of motion.  NO LE edema or erythema  Neurological: He is alert.  Skin: Skin is warm and dry.  Psychiatric: He has a normal mood and affect.  Nursing note and vitals reviewed.    ED Treatments / Results  Labs (all labs ordered are listed, but only abnormal results are displayed) Labs Reviewed  [place], ED    EKG  EKG Interpretation  Date/Time:  Tuesday February 22 2016 10:21:35 EDT Ventricular Rate:  60 PR Interval:    QRS Duration: 102 QT Interval:  429 QTC Calculation: 429 R Axis:   -11 Text Interpretation:  Sinus rhythm Borderline prolonged PR interval Nonspecific T abnormalities, anterior leads No  significant change since last tracing Confirmed by Winfred Leeds  MD, [facility] [phone]) on 02/22/2016 10:40:03 AM Also confirmed by Winfred Leeds  MD, [place] [phone]), editor Rolla Plate, Joelene Millin [phone])  on 02/22/2016 10:48:33 AM       Radiology Dg Chest 2 View  Result Date: 02/22/2016 CLINICAL DATA:  Productive cough at night for the past 5-6 weeks, onset of mid chest pain this morning, patient is an occasional smoker. History of [place] OPD, rectal malignancy, previous CVA. EXAM: CHEST  2 VIEW COMPARISON:  PA and lateral chest x-ray of January 06, 2016 FINDINGS: The lungs are mildly hyperinflated. The interstitial markings are increased bilaterally but stable. The cardiac silhouette  is top-normal in size but stable. The pulmonary vascularity is not engorged. There is tortuosity of the descending thoracic aorta. There is calcification in the wall of the aortic arch. The mediastinum is normal in width. There is no pleural effusion. There is multilevel degenerative disc disease of the thoracic spine with calcification of the anterior longitudinal ligament. IMPRESSION: COPD. Mild interstitial marking prominence, stable. No alveolar pneumonia. Borderline cardiomegaly, stable.  No pulmonary vascular congestion. Aortic atherosclerosis. Electronically Signed   By: David  Martinique M.D.   On: 02/22/2016 10:58    Procedures Procedures (including critical care time)  Medications Ordered in ED Medications - No data to display   Initial Impression / Assessment and Plan / ED Course  I have reviewed the triage vital signs and the nursing notes.  Pertinent labs & imaging results that were available during my care of the patient were reviewed by me and considered in my medical decision making (see chart for details).  Clinical Course      Final Clinical Impressions(s) / ED Diagnoses   Final diagnoses:  None   Patient presents today with a chief complaint of chest pain that he describes as a burning pain.  Initial and delta  troponin are negative.  No ischemic changes on EKG.  CXR is negative for acute findings.  Pain improved after given GI cocktail.  He had a Cardiac Cath done in May 2017, which showed nonobstructive CAD.  Pain worsens when lying down and improves with standing and walking around.  Feel that the pain is most likely GERD.  Patient stable for discharge.  Return precautions given.  Patient also evaluated by Dr Regenia Skeeter who is in agreement with the plan.  New Prescriptions New Prescriptions   No medications on file     H[place] Bible, PA-C 02/25/16 [place], MD 02/26/16 [phone]

## 2016-02-22 NOTE — ED Notes (Signed)
Patient ambulatory to lobby. NAD noted.

## 2016-02-22 NOTE — ED Triage Notes (Signed)
Pt with chest pain since this morning.  Pt denies reflux and physical activity.  Pt has no cough.  No shortness of breath. Does state yellow phlegm at night but denies the "cough" that would go with it.

## 2016-02-25 ENCOUNTER — Encounter (HOSPITAL_COMMUNITY): Payer: Self-pay | Admitting: *Deleted

## 2016-02-25 ENCOUNTER — Emergency Department (HOSPITAL_COMMUNITY)
Admission: EM | Admit: 2016-02-25 | Discharge: 2016-02-25 | Disposition: A | Discharge status: Home / Self Care | Payer: Medicare Other | Attending: Emergency Medicine | Admitting: Emergency Medicine

## 2016-02-25 DIAGNOSIS — I2510 Atherosclerotic heart disease of native coronary artery without angina pectoris: Secondary | ICD-10-CM | POA: Insufficient documentation

## 2016-02-25 DIAGNOSIS — Z7982 Long term (current) use of aspirin: Secondary | ICD-10-CM | POA: Insufficient documentation

## 2016-02-25 DIAGNOSIS — Z79899 Other long term (current) drug therapy: Secondary | ICD-10-CM | POA: Diagnosis not present

## 2016-02-25 DIAGNOSIS — Z85048 Personal history of other malignant neoplasm of rectum, rectosigmoid junction, and anus: Secondary | ICD-10-CM | POA: Diagnosis not present

## 2016-02-25 DIAGNOSIS — I10 Essential (primary) hypertension: Secondary | ICD-10-CM | POA: Insufficient documentation

## 2016-02-25 DIAGNOSIS — R51 Headache: Secondary | ICD-10-CM | POA: Diagnosis not present

## 2016-02-25 DIAGNOSIS — F17210 Nicotine dependence, cigarettes, uncomplicated: Secondary | ICD-10-CM | POA: Insufficient documentation

## 2016-02-25 DIAGNOSIS — Z791 Long term (current) use of non-steroidal anti-inflammatories (NSAID): Secondary | ICD-10-CM | POA: Diagnosis not present

## 2016-02-25 DIAGNOSIS — J449 Chronic obstructive pulmonary disease, unspecified: Secondary | ICD-10-CM | POA: Diagnosis not present

## 2016-02-25 DIAGNOSIS — R519 Headache, unspecified: Secondary | ICD-10-CM

## 2016-02-25 MED ORDER — BUTALBITAL-APAP-CAFFEINE 50-325-40 MG PO TABS: 1.0000 | ORAL_TABLET | Freq: Four times a day (QID) | ORAL | 0 refills | Status: DC | PRN

## 2016-02-25 MED ORDER — METOCLOPRAMIDE HCL 5 MG/ML IJ SOLN
10.0000 mg | Freq: Once | INTRAMUSCULAR | Status: AC
  Administered 2016-02-25: 10 mg via INTRAVENOUS
  Filled 2016-02-25: qty 2

## 2016-02-25 MED ORDER — DIPHENHYDRAMINE HCL 50 MG/ML IJ SOLN
12.5000 mg | Freq: Once | INTRAMUSCULAR | Status: AC
  Administered 2016-02-25: 12.5 mg via INTRAVENOUS
  Filled 2016-02-25: qty 1

## 2016-02-25 MED ORDER — KETOROLAC TROMETHAMINE 30 MG/ML IJ SOLN
15.0000 mg | Freq: Once | INTRAMUSCULAR | Status: AC
  Administered 2016-02-25: 15 mg via INTRAVENOUS
  Filled 2016-02-25: qty 1

## 2016-02-25 MED ORDER — SODIUM CHLORIDE 0.9 % IV BOLUS (SEPSIS)
500.0000 mL | Freq: Once | INTRAVENOUS | Status: AC
  Administered 2016-02-25: 500 mL via INTRAVENOUS

## 2016-02-25 NOTE — ED Provider Notes (Signed)
[facility] DEPT Provider Note   CSN: [id number] Arrival date & time: 02/25/16  6440     History   Chief Complaint Chief Complaint  Patient presents with  . Headache   HPI  Andre [name] is an 75 y.o. age male with history of CAD, HTN, stroke, chronic headaches who presents to the ED for evaluation of a headache. He reports a gradual onset right sided headache that has developed over the past two days. The pain has progressively become worse over the past two days. He has tried [place] powder with no relief. States now he feels a strong stabbing pain in his right parietal region. Endorses mild photophobia. Denies phonophobia, nausea, vomitnig. He reports a history of chronic intermittent headaches and has seen Dr. Krista Blue of neurology in the past. He states this headache feels similar to prior headaches in character though different in that it is persistent and more severe than typical. Denies blurry vision, numbness, weakness, fever, chills, facial droop, slurred speech. He has not followed up with neurology recently.   Past Medical History:  Diagnosis Date  . Arthritis   . Coronary artery disease, non-occlusive    a. 06/2011: cath for abnormal exercise echo: Only 30-40% proximal RCA. Otherwise normal. b. 10/2015: cath for abnormal NST and atypical CP --> showed mild 30-40% stenosis in the prox-distal RCA  . GERD (gastroesophageal reflux disease)   . HA (headache)   . Hypertension   . 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[place])   . TIA (transient ischemic attack)   . Weakness     Patient Active Problem List   Diagnosis Date Noted  . Abnormal stress test 10/18/2015  . Chest pain with moderate risk for cardiac etiology 09/30/2015  . Erectile dysfunction 02/25/2015  . Cigarette smoker 02/10/2015  . COPD GOLD 0 11/08/2014  . Upper airway cough syndrome 08/10/2014  . Hemorrhoids 02/11/2014  . Constipation 05/30/2013  . 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 Headache 10/23/2011  . 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[place])  10/23/2011  . GERD (gastroesophageal reflux disease) 10/23/2011  . Nicotine addiction 10/23/2011  . 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 Cervical stenosis of spinal canal 10/23/2011  . Essential hypertension, benign 07/10/2011    Past Surgical History:  Procedure Laterality Date  . CARDIAC CATHETERIZATION  January 2013   Proximal RCA 30-40%. Otherwise normal.  . CARDIAC CATHETERIZATION N/A 10/18/2015   Procedure: Left Heart Cath and Coronary Angiography;  Surgeon: Jettie Booze, MD;  Location: [facility];  Service: Cardiovascular;  Laterality: N/A;  . NM MYOVIEW LTD  March 2015   LOW RISK. No scar or ischemia. Normal EF (55%). No RWMA  . surgical excision of rectal cancer         Home Medications    Prior to Admission medications   Medication Sig Start Date End Date Taking? Authorizing Provider  acetaminophen (TYLENOL) 500 MG tablet Take 500 mg by mouth daily.    Yes Historical Provider, MD  aspirin EC 81 MG tablet Take 81 mg by mouth daily.   Yes Historical Provider, MD  bismuth subsalicylate (PEPTO BISMOL) 262 MG/15ML suspension Take 30 mLs by mouth every 6 (six) hours as needed for indigestion (and/or GI upset).   Yes Historical Provider, MD  famotidine (PEPCID) 20 MG tablet Take 20 mg by mouth 2 (two) times daily.   Yes Historical Provider, MD  metoprolol succinate (TOPROL-XL) 25 MG 24 hr tablet Take 1 tablet (25 mg total) by mouth daily. Patient taking differently: Take 25 mg by  mouth every morning.  02/08/16  Yes Golden Circle, FNP  nitroGLYCERIN (NITROSTAT) 0.4 MG SL tablet Place 1 tablet (0.4 mg total) under the tongue every 5 (five) minutes x 3 doses as needed for chest pain. 10/18/15  Yes Erma Heritage, PA  omeprazole (PRILOSEC) 20 MG capsule Take 1 capsule (20 mg total) by mouth daily. 02/22/16  Yes Heather Laisure, PA-C  SUMAtriptan (IMITREX) 50 MG tablet Take 50 mg by mouth every 2 (two) hours as needed for migraine.   Yes Historical Provider, MD    valsartan-hydrochlorothiazide (DIOVAN HCT) 160-25 MG tablet Take 1 tablet by mouth daily. 02/08/16  Yes Golden Circle, FNP  butalbital-acetaminophen-caffeine (FIORICET, ESGIC) [phone] MG tablet Take 1 tablet by mouth every 6 (six) hours as needed for headache. 02/25/16 02/24/17  Anne Ng, PA-C    Family History Family History  Problem Relation Age of Onset  . Hypertension Mother   . Cancer Mother   . Liver disease Father     Social History Social History  Substance Use Topics  . Smoking status: Current Some Day Smoker    Packs/day: 0.30    Years: 47.00    Types: Cigarettes  . Smokeless tobacco: Never Used  . Alcohol use 1.2 oz/week    2 Cans of beer per week     Allergies   Review of patient's allergies indicates no known allergies.   Review of Systems Review of Systems 10 Systems reviewed and are negative for acute change except as noted in the HPI.  Physical Exam Updated Vital Signs BP 124/78 (BP Location: Left Arm)   Pulse (!) 59   Temp 97.7 F (36.5 C) (Oral)   Resp 14   SpO2 98%   Physical Exam  Constitutional: He is oriented to person, place, and time.  HENT:  Right Ear: External ear normal.  Left Ear: External ear normal.  Nose: Nose normal.  Mouth/Throat: Oropharynx is clear and moist. No oropharyngeal exudate.  Eyes: Conjunctivae and EOM are normal. Pupils are equal, round, and reactive to light.  Neck: Normal range of motion. Neck supple.  No meningismus  Cardiovascular: Normal rate, regular rhythm, normal heart sounds and intact distal pulses.   Pulmonary/Chest: Effort normal and breath sounds normal. No respiratory distress. He has no wheezes.  Abdominal: Soft. Bowel sounds are normal. He exhibits no distension. There is no tenderness. There is no rebound and no guarding.  Musculoskeletal: He exhibits no edema.  Lymphadenopathy:    He has no cervical adenopathy.  Neurological: He is alert and oriented to person, place, and time. No cranial  nerve deficit.  5/5 strength bilateral upper and lower extremities Normal finger to nose No pronator drift  Skin: Skin is warm and dry.  Psychiatric: He has a normal mood and affect.  Nursing note and vitals reviewed.    ED Treatments / Results  Labs (all labs ordered are listed, but only abnormal results are displayed) Labs Reviewed - No data to display  EKG  EKG Interpretation None       Radiology No results found.  Procedures Procedures (including critical care time)  Medications Ordered in ED Medications  sodium chloride 0.9 % bolus 500 mL (0 mLs Intravenous Stopped 02/25/16 0524)  ketorolac (TORADOL) 30 MG/ML injection 15 mg (15 mg Intravenous Given 02/25/16 0358)  metoCLOPramide (REGLAN) injection 10 mg (10 mg Intravenous Given 02/25/16 0354)  diphenhydrAMINE (BENADRYL) injection 12.5 mg (12.5 mg Intravenous Given 02/25/16 0357)     Initial Impression /  Assessment and Plan / ED Course  I have reviewed the triage vital signs and the nursing notes.  Pertinent labs & imaging results that were available during my care of the patient were reviewed by me and considered in my medical decision making (see chart for details).  Clinical Course    Pt is an 76 y.o. male presenting with headache. Has a history of headaches and states this feels similar in character to past headaches though persistent and gradually progressively more severe. No neuro findings on exam. No meningeal signs. Headache has resolved with headache cocktail. Case discussed with attending Dr. Florina Ou. No indication for further emergent workup at this time. Encouraged follow up with neurology and primary care. ER return precautions given.  Final Clinical Impressions(s) / ED Diagnoses   Final diagnoses:  Acute nonintractable headache, unspecified headache type    New Prescriptions Discharge Medication List as of 02/25/2016  5:07 AM    START taking these medications   Details    butalbital-acetaminophen-caffeine (FIORICET, ESGIC) 50-325-40 MG tablet Take 1 tablet by mouth every 6 (six) hours as needed for headache., Starting Fri 02/25/2016, Until Sat 02/24/2017, Print         Anne Ng, PA-C 02/25/16 1521    Shanon Rosser, MD 02/25/16 2250

## 2016-02-25 NOTE — Discharge Instructions (Signed)
Take medication as prescribed as needed for pain. Please follow up with your neurologist, Dr. Krista Blue, as soon as possible.

## 2016-02-25 NOTE — ED Triage Notes (Signed)
Pt states that he has had a right sided headache x 2 days; pt states that it feels like someone is cutting in his head; pt denies visual changes; pt denies N/V; pt ambulatory without difficulty

## 2016-02-29 ENCOUNTER — Ambulatory Visit (INDEPENDENT_AMBULATORY_CARE_PROVIDER_SITE_OTHER): Payer: Medicare Other | Admitting: Family

## 2016-02-29 ENCOUNTER — Encounter: Payer: Self-pay | Admitting: Family

## 2016-02-29 VITALS — BP 108/62 | HR 76 | Temp 98.1°F | Resp 18 | Ht 71.0 in | Wt 198.0 lb

## 2016-02-29 DIAGNOSIS — M545 Low back pain, unspecified: Secondary | ICD-10-CM

## 2016-02-29 DIAGNOSIS — M549 Dorsalgia, unspecified: Secondary | ICD-10-CM | POA: Insufficient documentation

## 2016-02-29 DIAGNOSIS — R519 Headache, unspecified: Secondary | ICD-10-CM

## 2016-02-29 DIAGNOSIS — R51 Headache: Secondary | ICD-10-CM | POA: Diagnosis not present

## 2016-02-29 MED ORDER — NORTRIPTYLINE HCL 25 MG PO CAPS: 25.0000 mg | ORAL_CAPSULE | Freq: Every day | ORAL | 0 refills | Status: DC

## 2016-02-29 NOTE — Assessment & Plan Note (Signed)
Symptoms and exam consistent with low back muscle strain. Treat conservatively with ice/moist heat, home exercise therapy, and Tylenol as needed. No indication for imaging necessary at this time. Follow-up if symptoms worsen or do not improve.

## 2016-02-29 NOTE — Assessment & Plan Note (Signed)
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 Restart nortriptyline as he had some success with this and reducing his headaches in the past. Risks and benefits of medication discussed with patient. Continue Tylenol as needed. If symptoms worsen or do not improve consider referral to neurology for follow-up.

## 2016-02-29 NOTE — Progress Notes (Signed)
Subjective:    Patient ID: Andre [name], male    DOB: 09-Jul-1940, 75 y.o.   MRN: [id number]  Chief Complaint  Patient presents with  . Hospitalization Follow-up    still has issues with headaches, now having issues with pain in left side that goes across lower back    HPI:  Andre [name] is a 75 y.o. male who  has a past medical history of Arthritis; Coronary artery disease, non-occlusive; GERD (gastroesophageal reflux disease); HA (headache); Hypertension; 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[place]); TIA (transient ischemic attack); and Weakness. and presents today for a follow up office visit.    1.) Headaches - Recently evaluated in the emergency department for gradual onset right-sided headache that progressively worsened over the course of 2 days and refractory to [place] powder use. Physical exam with no significant neurological deficits. Headaches were felt to be similar to previous episodes and were treated with a headache cocktail including ketorolac, metoclopramide, and diphenhydramine. Symptoms were improved with these medications and he was encouraged to follow-up with primary care and neurology.  He was prescribed Fioricet. He continues to experience the associated symptom of a headache that comes and goes. Described as "burning".  Reports that he is taking the Fioricet every 4 hours and now taking it as needed. He continues to take the [place] powder as needed. No changes in vision. Denies worst headache of life.   2.) Left back pain  - This is a new problem. Associated symptom of pain located on the left side of his lower back has been going on for about 2 days. Modifying factors include a brace and heating pad which seem to help a little. No trauma or injury that he can recall. Describes the pain as sore. Aggravated with movement. Notes it after sitting for long periods of time. No numbness/tingling, saddle anesthesia, or changes to bowel habits.    No Known Allergies    Outpatient Medications Prior  to Visit  Medication Sig Dispense Refill  . acetaminophen (TYLENOL) 500 MG tablet Take 500 mg by mouth daily.     Marland Kitchen aspirin EC 81 MG tablet Take 81 mg by mouth daily.    Marland Kitchen bismuth subsalicylate (PEPTO BISMOL) 262 MG/15ML suspension Take 30 mLs by mouth every 6 (six) hours as needed for indigestion (and/or GI upset).    . butalbital-acetaminophen-caffeine (FIORICET, ESGIC) 50-325-40 MG tablet Take 1 tablet by mouth every 6 (six) hours as needed for headache. 20 tablet 0  . famotidine (PEPCID) 20 MG tablet Take 20 mg by mouth 2 (two) times daily.    . metoprolol succinate (TOPROL-XL) 25 MG 24 hr tablet Take 1 tablet (25 mg total) by mouth daily. (Patient taking differently: Take 25 mg by mouth every morning. ) 90 tablet 3  . nitroGLYCERIN (NITROSTAT) 0.4 MG SL tablet Place 1 tablet (0.4 mg total) under the tongue every 5 (five) minutes x 3 doses as needed for chest pain. 25 tablet 3  . omeprazole (PRILOSEC) 20 MG capsule Take 1 capsule (20 mg total) by mouth daily. 30 capsule 0  . valsartan-hydrochlorothiazide (DIOVAN HCT) 160-25 MG tablet Take 1 tablet by mouth daily. 90 tablet 0  . SUMAtriptan (IMITREX) 50 MG tablet Take 50 mg by mouth every 2 (two) hours as needed for migraine.     No facility-administered medications prior to visit.       Past Surgical History:  Procedure Laterality Date  . CARDIAC CATHETERIZATION  January 2013   Proximal RCA 30-40%. Otherwise normal.  . CARDIAC  CATHETERIZATION N/A 10/18/2015   Procedure: Left Heart Cath and Coronary Angiography;  Surgeon: Jettie Booze, MD;  Location: [facility];  Service: Cardiovascular;  Laterality: N/A;  . NM MYOVIEW LTD  March 2015   LOW RISK. No scar or ischemia. Normal EF (55%). No RWMA  . surgical excision of rectal cancer        Past Medical History:  Diagnosis Date  . Arthritis   . Coronary artery disease, non-occlusive    a. 06/2011: cath for abnormal exercise echo: Only 30-40% proximal RCA. Otherwise  normal. b. 10/2015: cath for abnormal NST and atypical CP --> showed mild 30-40% stenosis in the prox-distal RCA  . GERD (gastroesophageal reflux disease)   . HA (headache)   . Hypertension   . 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[place])   . TIA (transient ischemic attack)   . Weakness       Review of Systems  Constitutional: Negative for chills and fever.  Eyes: Negative for visual disturbance.  Respiratory: Negative for shortness of breath.   Cardiovascular: Negative for chest pain, palpitations and leg swelling.  Musculoskeletal: Positive for back pain.  Neurological: Positive for headaches. Negative for syncope, weakness and numbness.      Objective:    BP 108/62 (BP Location: Left Arm, Patient Position: Sitting, Cuff Size: Large)   Pulse 76   Temp 98.1 F (36.7 C) (Oral)   Resp 18   Ht '5\' 11"'$  (1.803 m)   Wt 198 lb (89.8 kg)   SpO2 97%   BMI 27.62 kg/m  Nursing note and vital signs reviewed.  Physical Exam  Constitutional: He is oriented to person, place, and time. He appears well-developed and well-nourished. No distress.  Eyes: Conjunctivae and EOM are normal. Pupils are equal, round, and reactive to light.  Cardiovascular: Normal rate, regular rhythm, normal heart sounds and intact distal pulses.   Pulmonary/Chest: Effort normal and breath sounds normal.  Musculoskeletal:  Low back - no obvious deformity, discoloration, or edema. Mild tenderness with no spasm located on the left paraspinal musculature. No crepitus or deformity. Range of motion is within normal limits. There is some discomfort with flexion and lateral bending. Distal pulses and sensation are intact and appropriate. Negative straight leg raise and negative Faber's.  Neurological: He is alert and oriented to person, place, and time. He has normal reflexes. No cranial nerve deficit.  Skin: Skin is warm and dry.  Psychiatric: He has a normal mood and affect. His behavior is normal. Judgment and thought content normal.         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 Restart nortriptyline as he had some success with this and reducing his headaches in the past. Risks and benefits of medication discussed with patient. Continue Tylenol as needed. If symptoms worsen or do not improve consider referral to neurology for follow-up.      Relevant Medications   nortriptyline (PAMELOR) 25 MG capsule   Back pain    Symptoms and exam consistent with low back muscle strain. Treat conservatively with ice/moist heat, home exercise therapy, and Tylenol as needed. No indication for imaging necessary at this time. Follow-up if symptoms worsen or do not improve.       Other Visit Diagnoses   None.      I have discontinued Mr. Schlink's SUMAtriptan. I am also having him start on nortriptyline.  Additionally, I am having him maintain his acetaminophen, nitroGLYCERIN, aspirin EC, valsartan-hydrochlorothiazide, metoprolol succinate, famotidine, bismuth subsalicylate, omeprazole, and butalbital-acetaminophen-caffeine.   Meds ordered this encounter  Medications  . nortriptyline (PAMELOR) 25 MG capsule    Sig: Take 1 capsule (25 mg total) by mouth at bedtime.    Dispense:  30 capsule    Refill:  0    Order Specific Question:   Supervising Provider    Answer:   Pricilla Holm A [[id number]]     Follow-up: Return in about 1 month (around 03/30/2016), or if symptoms worsen or fail to improve.  Mauricio Po, FNP

## 2016-02-29 NOTE — Patient Instructions (Addendum)
Thank you for choosing [facility].  SUMMARY AND INSTRUCTIONS:  For your back:  Ice/moist heat x 20 minutes every 2 hours as needed and following activity.  Stretches and exercises daily.  Continue with Tylenol as needed.   Medication:  Please start taking the Nortriptyline nightly.  Be careful with the amount of Goody's powder and Fioricet as they contain similar ingredients.  Your prescription(s) have been submitted to your pharmacy or been printed and provided for you. Please take as directed and contact our office if you believe you are having problem(s) with the medication(s) or have any questions.  Follow up:  If your symptoms worsen or fail to improve, please contact our office for further instruction, or in case of emergency go directly to the emergency room at the closest medical facility.    Low Back Sprain With Rehab A sprain is an injury in which a ligament is torn. The ligaments of the lower back are vulnerable to sprains. However, they are strong and require great force to be injured. These ligaments are important for stabilizing the spinal column. Sprains are classified into three categories. Grade 1 sprains cause pain, but the tendon is not lengthened. Grade 2 sprains include a lengthened ligament, due to the ligament being stretched or partially ruptured. With grade 2 sprains there is still function, although the function may be decreased. Grade 3 sprains involve a complete tear of the tendon or muscle, and function is usually impaired. SYMPTOMS   Severe pain in the lower back.  Sometimes, a feeling of a "pop," "snap," or tear, at the time of injury.  Tenderness and sometimes swelling at the injury site.  Uncommonly, bruising (contusion) within 48 hours of injury.  Muscle spasms in the back. CAUSES  Low back sprains occur when a force is placed on the ligaments that is greater than they can handle. Common causes of injury include:  Performing a  stressful act while off-balance.  Repetitive stressful activities that involve movement of the lower back.  Direct hit (trauma) to the lower back. RISK INCREASES WITH:  Contact sports (football, wrestling).  Collisions (major skiing accidents).  Sports that require throwing or lifting (baseball, weightlifting).  Sports involving twisting of the spine (gymnastics, diving, tennis, golf).  Poor strength and flexibility.  Inadequate protection.  Previous back injury or surgery (especially fusion). PREVENTION  Wear properly fitted and padded protective equipment.  Warm up and stretch properly before activity.  Allow for adequate recovery between workouts.  Maintain physical fitness:  Strength, flexibility, and endurance.  Cardiovascular fitness.  Maintain a healthy body weight. PROGNOSIS  If treated properly, low back sprains usually heal with non-surgical treatment. The length of time for healing depends on the severity of the injury.  RELATED COMPLICATIONS   Recurring symptoms, resulting in a chronic problem.  Chronic inflammation and pain in the low back.  Delayed healing or resolution of symptoms, especially if activity is resumed too soon.  Prolonged impairment.  Unstable or arthritic joints of the low back. TREATMENT  Treatment first involves the use of ice and medicine, to reduce pain and inflammation. The use of strengthening and stretching exercises may help reduce pain with activity. These exercises may be performed at home or with a therapist. Severe injuries may require referral to a therapist for further evaluation and treatment, such as ultrasound. Your caregiver may advise that you wear a back brace or corset, to help reduce pain and discomfort. Often, prolonged bed rest results in greater harm then benefit.  Corticosteroid injections may be recommended. However, these should be reserved for the most serious cases. It is important to avoid using your back  when lifting objects. At night, sleep on your back on a firm mattress, with a pillow placed under your knees. If non-surgical treatment is unsuccessful, surgery may be needed.  MEDICATION   If pain medicine is needed, nonsteroidal anti-inflammatory medicines (aspirin and ibuprofen), or other minor pain relievers (acetaminophen), are often advised.  Do not take pain medicine for 7 days before surgery.  Prescription pain relievers may be given, if your caregiver thinks they are needed. Use only as directed and only as much as you need.  Ointments applied to the skin may be helpful.  Corticosteroid injections may be given by your caregiver. These injections should be reserved for the most serious cases, because they may only be given a certain number of times. HEAT AND COLD  Cold treatment (icing) should be applied for 10 to 15 minutes every 2 to 3 hours for inflammation and pain, and immediately after activity that aggravates your symptoms. Use ice packs or an ice massage.  Heat treatment may be used before performing stretching and strengthening activities prescribed by your caregiver, physical therapist, or athletic trainer. Use a heat pack or a warm water soak. SEEK MEDICAL CARE IF:   Symptoms get worse or do not improve in 2 to 4 weeks, despite treatment.  You develop numbness or weakness in either leg.  You lose bowel or bladder function.  Any of the following occur after surgery: fever, increased pain, swelling, redness, drainage of fluids, or bleeding in the affected area.  New, unexplained symptoms develop. (Drugs used in treatment may produce side effects.) EXERCISES  RANGE OF MOTION (ROM) AND STRETCHING EXERCISES - Low Back Sprain Most people with lower back pain will find that their symptoms get worse with excessive bending forward (flexion) or arching at the lower back (extension). The exercises that will help resolve your symptoms will focus on the opposite motion.  Your  physician, physical therapist or athletic trainer will help you determine which exercises will be most helpful to resolve your lower back pain. Do not complete any exercises without first consulting with your caregiver. Discontinue any exercises which make your symptoms worse, until you speak to your caregiver. If you have pain, numbness or tingling which travels down into your buttocks, leg or foot, the goal of the therapy is for these symptoms to move closer to your back and eventually resolve. Sometimes, these leg symptoms will get better, but your lower back pain may worsen. This is often an indication of progress in your rehabilitation. Be very alert to any changes in your symptoms and the activities in which you participated in the 24 hours prior to the change. Sharing this information with your caregiver will allow him or her to most efficiently treat your condition. These exercises may help you when beginning to rehabilitate your injury. Your symptoms may resolve with or without further involvement from your physician, physical therapist or athletic trainer. While completing these exercises, remember:   Restoring tissue flexibility helps normal motion to return to the joints. This allows healthier, less painful movement and activity.  An effective stretch should be held for at least 30 seconds.  A stretch should never be painful. You should only feel a gentle lengthening or release in the stretched tissue. FLEXION RANGE OF MOTION AND STRETCHING EXERCISES: STRETCH - Flexion, Single Knee to Chest   Lie on a firm bed  or floor with both legs extended in front of you.  Keeping one leg in contact with the floor, bring your opposite knee to your chest. Hold your leg in place by either grabbing behind your thigh or at your knee.  Pull until you feel a gentle stretch in your low back. Hold __________ seconds.  Slowly release your grasp and repeat the exercise with the opposite side. Repeat  __________ times. Complete this exercise __________ times per day.  STRETCH - Flexion, Double Knee to Chest  Lie on a firm bed or floor with both legs extended in front of you.  Keeping one leg in contact with the floor, bring your opposite knee to your chest.  Tense your stomach muscles to support your back and then lift your other knee to your chest. Hold your legs in place by either grabbing behind your thighs or at your knees.  Pull both knees toward your chest until you feel a gentle stretch in your low back. Hold __________ seconds.  Tense your stomach muscles and slowly return one leg at a time to the floor. Repeat __________ times. Complete this exercise __________ times per day.  STRETCH - Low Trunk Rotation  Lie on a firm bed or floor. Keeping your legs in front of you, bend your knees so they are both pointed toward the ceiling and your feet are flat on the floor.  Extend your arms out to the side. This will stabilize your upper body by keeping your shoulders in contact with the floor.  Gently and slowly drop both knees together to one side until you feel a gentle stretch in your low back. Hold for __________ seconds.  Tense your stomach muscles to support your lower back as you bring your knees back to the starting position. Repeat the exercise to the other side. Repeat __________ times. Complete this exercise __________ times per day  EXTENSION RANGE OF MOTION AND FLEXIBILITY EXERCISES: STRETCH - Extension, Prone on Elbows   Lie on your stomach on the floor, a bed will be too soft. Place your palms about shoulder width apart and at the height of your head.  Place your elbows under your shoulders. If this is too painful, stack pillows under your chest.  Allow your body to relax so that your hips drop lower and make contact more completely with the floor.  Hold this position for __________ seconds.  Slowly return to lying flat on the floor. Repeat __________ times.  Complete this exercise __________ times per day.  RANGE OF MOTION - Extension, Prone Press Ups  Lie on your stomach on the floor, a bed will be too soft. Place your palms about shoulder width apart and at the height of your head.  Keeping your back as relaxed as possible, slowly straighten your elbows while keeping your hips on the floor. You may adjust the placement of your hands to maximize your comfort. As you gain motion, your hands will come more underneath your shoulders.  Hold this position __________ seconds.  Slowly return to lying flat on the floor. Repeat __________ times. Complete this exercise __________ times per day.  RANGE OF MOTION- Quadruped, Neutral Spine   Assume a hands and knees position on a firm surface. Keep your hands under your shoulders and your knees under your hips. You may place padding under your knees for comfort.  Drop your head and point your tailbone toward the ground below you. This will round out your lower back like an angry cat.  Hold this position for __________ seconds.  Slowly lift your head and release your tail bone so that your back sags into a large arch, like an old horse.  Hold this position for __________ seconds.  Repeat this until you feel limber in your low back.  Now, find your "sweet spot." This will be the most comfortable position somewhere between the two previous positions. This is your neutral spine. Once you have found this position, tense your stomach muscles to support your low back.  Hold this position for __________ seconds. Repeat __________ times. Complete this exercise __________ times per day.  STRENGTHENING EXERCISES - Low Back Sprain These exercises may help you when beginning to rehabilitate your injury. These exercises should be done near your "sweet spot." This is the neutral, low-back arch, somewhere between fully rounded and fully arched, that is your least painful position. When performed in this safe range of  motion, these exercises can be used for people who have either a flexion or extension based injury. These exercises may resolve your symptoms with or without further involvement from your physician, physical therapist or athletic trainer. While completing these exercises, remember:   Muscles can gain both the endurance and the strength needed for everyday activities through controlled exercises.  Complete these exercises as instructed by your physician, physical therapist or athletic trainer. Increase the resistance and repetitions only as guided.  You may experience muscle soreness or fatigue, but the pain or discomfort you are trying to eliminate should never worsen during these exercises. If this pain does worsen, stop and make certain you are following the directions exactly. If the pain is still present after adjustments, discontinue the exercise until you can discuss the trouble with your caregiver. STRENGTHENING - Deep Abdominals, Pelvic Tilt   Lie on a firm bed or floor. Keeping your legs in front of you, bend your knees so they are both pointed toward the ceiling and your feet are flat on the floor.  Tense your lower abdominal muscles to press your low back into the floor. This motion will rotate your pelvis so that your tail bone is scooping upwards rather than pointing at your feet or into the floor. With a gentle tension and even breathing, hold this position for __________ seconds. Repeat __________ times. Complete this exercise __________ times per day.  STRENGTHENING - Abdominals, Crunches   Lie on a firm bed or floor. Keeping your legs in front of you, bend your knees so they are both pointed toward the ceiling and your feet are flat on the floor. Cross your arms over your chest.  Slightly tip your chin down without bending your neck.  Tense your abdominals and slowly lift your trunk high enough to just clear your shoulder blades. Lifting higher can put excessive stress on the  lower back and does not further strengthen your abdominal muscles.  Control your return to the starting position. Repeat __________ times. Complete this exercise __________ times per day.  STRENGTHENING - Quadruped, Opposite UE/LE Lift   Assume a hands and knees position on a firm surface. Keep your hands under your shoulders and your knees under your hips. You may place padding under your knees for comfort.  Find your neutral spine and gently tense your abdominal muscles so that you can maintain this position. Your shoulders and hips should form a rectangle that is parallel with the floor and is not twisted.  Keeping your trunk steady, lift your right hand no higher than your shoulder and  then your left leg no higher than your hip. Make sure you are not holding your breath. Hold this position for __________ seconds.  Continuing to keep your abdominal muscles tense and your back steady, slowly return to your starting position. Repeat with the opposite arm and leg. Repeat __________ times. Complete this exercise __________ times per day.  STRENGTHENING - Abdominals and Quadriceps, Straight Leg Raise   Lie on a firm bed or floor with both legs extended in front of you.  Keeping one leg in contact with the floor, bend the other knee so that your foot can rest flat on the floor.  Find your neutral spine, and tense your abdominal muscles to maintain your spinal position throughout the exercise.  Slowly lift your straight leg off the floor about 6 inches for a count of 15, making sure to not hold your breath.  Still keeping your neutral spine, slowly lower your leg all the way to the floor. Repeat this exercise with each leg __________ times. Complete this exercise __________ times per day. POSTURE AND BODY MECHANICS CONSIDERATIONS - Low Back Sprain Keeping correct posture when sitting, standing or completing your activities will reduce the stress put on different body tissues, allowing injured  tissues a chance to heal and limiting painful experiences. The following are general guidelines for improved posture. Your physician or physical therapist will provide you with any instructions specific to your needs. While reading these guidelines, remember:  The exercises prescribed by your provider will help you have the flexibility and strength to maintain correct postures.  The correct posture provides the best environment for your joints to work. All of your joints have less wear and tear when properly supported by a spine with good posture. This means you will experience a healthier, less painful body.  Correct posture must be practiced with all of your activities, especially prolonged sitting and standing. Correct posture is as important when doing repetitive low-stress activities (typing) as it is when doing a single heavy-load activity (lifting). RESTING POSITIONS Consider which positions are most painful for you when choosing a resting position. If you have pain with flexion-based activities (sitting, bending, stooping, squatting), choose a position that allows you to rest in a less flexed posture. You would want to avoid curling into a fetal position on your side. If your pain worsens with extension-based activities (prolonged standing, working overhead), avoid resting in an extended position such as sleeping on your stomach. Most people will find more comfort when they rest with their spine in a more neutral position, neither too rounded nor too arched. Lying on a non-sagging bed on your side with a pillow between your knees, or on your back with a pillow under your knees will often provide some relief. Keep in mind, being in any one position for a prolonged period of time, no matter how correct your posture, can still lead to stiffness. PROPER SITTING POSTURE In order to minimize stress and discomfort on your spine, you must sit with correct posture. Sitting with good posture should be  effortless for a healthy body. Returning to good posture is a gradual process. Many people can work toward this most comfortably by using various supports until they have the flexibility and strength to maintain this posture on their own. When sitting with proper posture, your ears will fall over your shoulders and your shoulders will fall over your hips. You should use the back of the chair to support your upper back. Your lower back will be  in a neutral position, just slightly arched. You may place a small pillow or folded towel at the base of your lower back for  support.  When working at a desk, create an environment that supports good, upright posture. Without extra support, muscles tire, which leads to excessive strain on joints and other tissues. Keep these recommendations in mind: CHAIR:  A chair should be able to slide under your desk when your back makes contact with the back of the chair. This allows you to work closely.  The chair's height should allow your eyes to be level with the upper part of your monitor and your hands to be slightly lower than your elbows. BODY POSITION  Your feet should make contact with the floor. If this is not possible, use a foot rest.  Keep your ears over your shoulders. This will reduce stress on your neck and low back. INCORRECT SITTING POSTURES  If you are feeling tired and unable to assume a healthy sitting posture, do not slouch or slump. This puts excessive strain on your back tissues, causing more damage and pain. Healthier options include:  Using more support, like a lumbar pillow.  Switching tasks to something that requires you to be upright or walking.  Talking a brief walk.  Lying down to rest in a neutral-spine position. PROLONGED STANDING WHILE SLIGHTLY LEANING FORWARD  When completing a task that requires you to lean forward while standing in one place for a long time, place either foot up on a stationary 2-4 inch high object to help  maintain the best posture. When both feet are on the ground, the lower back tends to lose its slight inward curve. If this curve flattens (or becomes too large), then the back and your other joints will experience too much stress, tire more quickly, and can cause pain. CORRECT STANDING POSTURES Proper standing posture should be assumed with all daily activities, even if they only take a few moments, like when brushing your teeth. As in sitting, your ears should fall over your shoulders and your shoulders should fall over your hips. You should keep a slight tension in your abdominal muscles to brace your spine. Your tailbone should point down to the ground, not behind your body, resulting in an over-extended swayback posture.  INCORRECT STANDING POSTURES  Common incorrect standing postures include a forward head, locked knees and/or an excessive swayback. WALKING Walk with an upright posture. Your ears, shoulders and hips should all line-up. PROLONGED ACTIVITY IN A FLEXED POSITION When completing a task that requires you to bend forward at your waist or lean over a low surface, try to find a way to stabilize 3 out of 4 of your limbs. You can place a hand or elbow on your thigh or rest a knee on the surface you are reaching across. This will provide you more stability, so that your muscles do not tire as quickly. By keeping your knees relaxed, or slightly bent, you will also reduce stress across your lower back. CORRECT LIFTING TECHNIQUES DO :  Assume a wide stance. This will provide you more stability and the opportunity to get as close as possible to the object which you are lifting.  Tense your abdominals to brace your spine. Bend at the knees and hips. Keeping your back locked in a neutral-spine position, lift using your leg muscles. Lift with your legs, keeping your back straight.  Test the weight of unknown objects before attempting to lift them.  Try to keep your  elbows locked down at your  sides in order get the best strength from your shoulders when carrying an object.  Always ask for help when lifting heavy or awkward objects. INCORRECT LIFTING TECHNIQUES DO NOT:   Lock your knees when lifting, even if it is a small object.  Bend and twist. Pivot at your feet or move your feet when needing to change directions.  Assume that you can safely pick up even a paperclip without proper posture.   This information is not intended to replace advice given to you by your health care provider. Make sure you discuss any questions you have with your health care provider.   Document Released: 05/29/2005 Document Revised: 06/19/2014 Document Reviewed: 09/10/2008 [employer] Interactive Patient Education [employer].

## 2016-03-19 ENCOUNTER — Emergency Department (HOSPITAL_COMMUNITY): Payer: Medicare Other

## 2016-03-19 ENCOUNTER — Observation Stay (HOSPITAL_COMMUNITY)
Admission: EM | Admit: 2016-03-19 | Discharge: 2016-03-20 | Disposition: A | Discharge status: Home / Self Care | Payer: Medicare Other | Attending: Internal Medicine | Admitting: Internal Medicine

## 2016-03-19 ENCOUNTER — Encounter (HOSPITAL_COMMUNITY): Payer: Self-pay

## 2016-03-19 DIAGNOSIS — F17210 Nicotine dependence, cigarettes, uncomplicated: Secondary | ICD-10-CM | POA: Insufficient documentation

## 2016-03-19 DIAGNOSIS — K219 Gastro-esophageal reflux disease without esophagitis: Secondary | ICD-10-CM | POA: Diagnosis not present

## 2016-03-19 DIAGNOSIS — I10 Essential (primary) hypertension: Secondary | ICD-10-CM | POA: Diagnosis not present

## 2016-03-19 DIAGNOSIS — R51 Headache: Secondary | ICD-10-CM

## 2016-03-19 DIAGNOSIS — Z79899 Other long term (current) drug therapy: Secondary | ICD-10-CM | POA: Insufficient documentation

## 2016-03-19 DIAGNOSIS — Z85048 Personal history of other malignant neoplasm of rectum, rectosigmoid junction, and anus: Secondary | ICD-10-CM | POA: Insufficient documentation

## 2016-03-19 DIAGNOSIS — R519 Headache, unspecified: Secondary | ICD-10-CM | POA: Diagnosis present

## 2016-03-19 DIAGNOSIS — R079 Chest pain, unspecified: Secondary | ICD-10-CM | POA: Diagnosis not present

## 2016-03-19 DIAGNOSIS — I2510 Atherosclerotic heart disease of native coronary artery without angina pectoris: Secondary | ICD-10-CM | POA: Insufficient documentation

## 2016-03-19 DIAGNOSIS — R0789 Other chest pain: Secondary | ICD-10-CM | POA: Diagnosis not present

## 2016-03-19 DIAGNOSIS — C20 Malignant neoplasm of rectum: Secondary | ICD-10-CM | POA: Diagnosis present

## 2016-03-19 DIAGNOSIS — F1011 Alcohol abuse, in remission: Secondary | ICD-10-CM | POA: Diagnosis present

## 2016-03-19 DIAGNOSIS — I639 Cerebral infarction, unspecified: Secondary | ICD-10-CM | POA: Diagnosis present

## 2016-03-19 DIAGNOSIS — R05 Cough: Secondary | ICD-10-CM | POA: Diagnosis not present

## 2016-03-19 DIAGNOSIS — Z8673 Personal history of transient ischemic attack (TIA), and cerebral infarction without residual deficits: Secondary | ICD-10-CM | POA: Diagnosis not present

## 2016-03-19 DIAGNOSIS — F17200 Nicotine dependence, unspecified, uncomplicated: Secondary | ICD-10-CM | POA: Diagnosis present

## 2016-03-19 DIAGNOSIS — J449 Chronic obstructive pulmonary disease, unspecified: Secondary | ICD-10-CM | POA: Diagnosis not present

## 2016-03-19 DIAGNOSIS — Z7982 Long term (current) use of aspirin: Secondary | ICD-10-CM | POA: Diagnosis not present

## 2016-03-19 DIAGNOSIS — Z720 Tobacco use: Secondary | ICD-10-CM | POA: Diagnosis not present

## 2016-03-19 DIAGNOSIS — I6350 Cerebral infarction due to unspecified occlusion or stenosis of unspecified cerebral artery: Secondary | ICD-10-CM | POA: Diagnosis present

## 2016-03-19 LAB — CBC
HCT: 48.4 % (ref 39.0–52.0)
HEMOGLOBIN: 16.2 g/dL (ref 13.0–17.0)
MCH: 31.8 pg (ref 26.0–34.0)
MCHC: 33.5 g/dL (ref 30.0–36.0)
MCV: 94.9 fL (ref 78.0–100.0)
PLATELETS: 207 10*3/uL (ref 150–400)
RBC: 5.1 MIL/uL (ref 4.22–5.81)
RDW: 14.7 % (ref 11.5–15.5)
WBC: 5.9 10*3/uL (ref 4.0–10.5)

## 2016-03-19 LAB — BASIC METABOLIC PANEL
ANION GAP: 10 (ref 5–15)
BUN: 7 mg/dL (ref 6–20)
CALCIUM: 9.7 mg/dL (ref 8.9–10.3)
CO2: 28 mmol/L (ref 22–32)
CREATININE: 0.87 mg/dL (ref 0.61–1.24)
Chloride: 100 mmol/L — ABNORMAL LOW (ref 101–111)
Glucose, Bld: 128 mg/dL — ABNORMAL HIGH (ref 65–99)
Potassium: 3.5 mmol/L (ref 3.5–5.1)
SODIUM: 138 mmol/L (ref 135–145)

## 2016-03-19 LAB — I-STAT TROPONIN, ED
TROPONIN I, POC: 0 ng/mL (ref 0.00–0.08)
Troponin i, poc: 0 ng/mL (ref 0.00–0.08)

## 2016-03-19 LAB — TROPONIN I
Troponin I: <0.03 ng/mL (ref <0.03)
Troponin I: <0.03 ng/mL (ref <0.03)
Troponin I: <0.03 ng/mL (ref <0.03)

## 2016-03-19 MED ORDER — PANTOPRAZOLE SODIUM 40 MG PO TBEC
40.0000 mg | DELAYED_RELEASE_TABLET | Freq: Two times a day (BID) | ORAL | Status: DC
  Administered 2016-03-19 – 2016-03-20 (×2): 40 mg via ORAL
  Filled 2016-03-19 (×2): qty 1

## 2016-03-19 MED ORDER — IRBESARTAN 150 MG PO TABS
150.0000 mg | ORAL_TABLET | Freq: Every day | ORAL | Status: DC
  Administered 2016-03-20: 150 mg via ORAL
  Filled 2016-03-19: qty 1

## 2016-03-19 MED ORDER — BUTALBITAL-APAP-CAFFEINE 50-325-40 MG PO TABS
1.0000 | ORAL_TABLET | Freq: Four times a day (QID) | ORAL | Status: DC | PRN
  Administered 2016-03-19 – 2016-03-20 (×3): 1 via ORAL
  Filled 2016-03-19 (×3): qty 1

## 2016-03-19 MED ORDER — VALSARTAN-HYDROCHLOROTHIAZIDE 160-25 MG PO TABS: 1.0000 | ORAL_TABLET | Freq: Every day | ORAL | Status: DC

## 2016-03-19 MED ORDER — NORTRIPTYLINE HCL 25 MG PO CAPS
25.0000 mg | ORAL_CAPSULE | Freq: Every day | ORAL | Status: DC
Start: 2016-03-19 — End: 2016-03-20
  Administered 2016-03-19: 25 mg via ORAL
  Filled 2016-03-19: qty 1

## 2016-03-19 MED ORDER — SODIUM CHLORIDE 0.9 % IV SOLN: INTRAVENOUS | Status: DC

## 2016-03-19 MED ORDER — ACETAMINOPHEN 325 MG PO TABS: 650.0000 mg | ORAL_TABLET | ORAL | Status: DC | PRN

## 2016-03-19 MED ORDER — ALUM & MAG HYDROXIDE-SIMETH 200-200-20 MG/5ML PO SUSP
30.0000 mL | ORAL | Status: DC | PRN
  Administered 2016-03-19: 30 mL via ORAL
  Filled 2016-03-19: qty 30

## 2016-03-19 MED ORDER — ASPIRIN 81 MG PO CHEW
324.0000 mg | CHEWABLE_TABLET | Freq: Once | ORAL | Status: AC
  Administered 2016-03-19: 324 mg via ORAL
  Filled 2016-03-19: qty 4

## 2016-03-19 MED ORDER — GI COCKTAIL ~~LOC~~
30.0000 mL | Freq: Four times a day (QID) | ORAL | Status: DC | PRN
  Administered 2016-03-19 – 2016-03-20 (×2): 30 mL via ORAL
  Filled 2016-03-19 (×2): qty 30

## 2016-03-19 MED ORDER — MORPHINE SULFATE (PF) 2 MG/ML IV SOLN: 1.0000 mg | INTRAVENOUS | Status: DC | PRN

## 2016-03-19 MED ORDER — ACETAMINOPHEN 325 MG PO TABS
650.0000 mg | ORAL_TABLET | Freq: Once | ORAL | Status: AC
  Administered 2016-03-19: 650 mg via ORAL
  Filled 2016-03-19: qty 2

## 2016-03-19 MED ORDER — FAMOTIDINE IN NACL 20-0.9 MG/50ML-% IV SOLN
20.0000 mg | Freq: Once | INTRAVENOUS | Status: AC
  Administered 2016-03-19: 20 mg via INTRAVENOUS
  Filled 2016-03-19: qty 50

## 2016-03-19 MED ORDER — HEPARIN SODIUM (PORCINE) 5000 UNIT/ML IJ SOLN
5000.0000 [IU] | Freq: Three times a day (TID) | INTRAMUSCULAR | Status: DC
  Filled 2016-03-19: qty 1

## 2016-03-19 MED ORDER — ONDANSETRON HCL 4 MG/2ML IJ SOLN: 4.0000 mg | Freq: Four times a day (QID) | INTRAMUSCULAR | Status: DC | PRN

## 2016-03-19 MED ORDER — HYDROCHLOROTHIAZIDE 25 MG PO TABS
25.0000 mg | ORAL_TABLET | Freq: Every day | ORAL | Status: DC
  Administered 2016-03-20: 25 mg via ORAL
  Filled 2016-03-19: qty 1

## 2016-03-19 MED ORDER — SODIUM CHLORIDE 0.9 % IV BOLUS (SEPSIS)
1000.0000 mL | Freq: Once | INTRAVENOUS | Status: AC
  Administered 2016-03-19: 1000 mL via INTRAVENOUS

## 2016-03-19 MED ORDER — MAGNESIUM HYDROXIDE 400 MG/5ML PO SUSP
30.0000 mL | Freq: Every day | ORAL | Status: DC | PRN
  Filled 2016-03-19: qty 30

## 2016-03-19 MED ORDER — GI COCKTAIL ~~LOC~~
30.0000 mL | Freq: Once | ORAL | Status: AC
  Administered 2016-03-19: 30 mL via ORAL
  Filled 2016-03-19: qty 30

## 2016-03-19 MED ORDER — NITROGLYCERIN 0.4 MG SL SUBL
0.4000 mg | SUBLINGUAL_TABLET | SUBLINGUAL | Status: AC | PRN
  Administered 2016-03-19 (×3): 0.4 mg via SUBLINGUAL
  Filled 2016-03-19: qty 1

## 2016-03-19 NOTE — H&P (Signed)
History and Physical    Andre [name] YQM:[id number] DOB: 01/24/1941 DOA: 03/19/2016   PCP: Mauricio Po, FNP   Patient coming from:  Home     Chief Complaint: Chest Pain   HPI: Andre [name] is a 75 y.o. male with medical history significant for HTN, chronic migraines, rectal cancer not on treatment, history of TIAs, GERD, Non occlusive  CAD, with waxing and waning symptoms since his last cath in May 2017,  presenting with  worse substernal chest pain since last night, around 11 pm, intermittent, waking him up from sleep, without  diaphoresis or radiation to the jaw or left arm, described as 6 /10, notworsened with deep inspiration, movement or exertion.  Patient took ASA 324 mg on arrival, and then given NTG x 3  with some relief.   Denies any dizziness or falls. No syncope or presyncope.  Reports some acute on chronic dyspnea, worse on exertion. He also reports nightly cough for about one month. Denies any fever or chills. Denies any nausea, vomiting or abdominal pain. Appetite is normal and eats salt rich foods. Denies any leg swelling or calf pain. Denies any headaches or vision changes. Denies any seizures No confusion reported. PAtient has not been seen by Cardiology since his discharge in May.  No recent long distance trips. Denies any new stressors. No new meds. Not on hormonal therapy.  No new herbal supplements.Continues to smoke. No ETOH at this time but does report prior alcohol abuse  or recreational drugs. No recent Viagra   ED Course:  BP (!) 154/74 (BP Location: Right Arm)   Pulse (!) 56   Temp 97.5 F (36.4 C) (Oral)   Resp 16   Ht '5\' 11"'$  (1.803 m)   Wt 89.9 kg (198 lb 4.8 oz)   SpO2 100%   BMI 27.66 kg/m    troponin 0 EKG without acute coronary changes.  White count 5.9 hemoglobin 16.2 platelets 207 no recent echo, last 2013. Nuclear myocardial perfusion test performance May 2017 shows EF at 44% with no regional wall motion abnormalities, intermediate risk  study. Received nitroglycerin times 3 with relief.  Review of Systems: As per HPI otherwise 10 point review of systems negative.   Past Medical History:  Diagnosis Date  . Arthritis   . Coronary artery disease, non-occlusive    a. 06/2011: cath for abnormal exercise echo: Only 30-40% proximal RCA. Otherwise normal. b. 10/2015: cath for abnormal NST and atypical CP --> showed mild 30-40% stenosis in the prox-distal RCA  . GERD (gastroesophageal reflux disease)   . HA (headache)   . Hypertension   . 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[place])   . TIA (transient ischemic attack)   . Weakness     Past Surgical History:  Procedure Laterality Date  . CARDIAC CATHETERIZATION  January 2013   Proximal RCA 30-40%. Otherwise normal.  . CARDIAC CATHETERIZATION N/A 10/18/2015   Procedure: Left Heart Cath and Coronary Angiography;  Surgeon: Jettie Booze, MD;  Location: [facility];  Service: Cardiovascular;  Laterality: N/A;  . NM MYOVIEW LTD  March 2015   LOW RISK. No scar or ischemia. Normal EF (55%). No RWMA  . surgical excision of rectal cancer      Social History Social History   Social History  . Marital status: Single    Spouse name: N/A  . Number of children: 11  . Years of education: 78   Occupational History  . Retried     retired   Science writer History  Main Topics  . Smoking status: Current Some Day Smoker    Packs/day: 0.30    Years: 47.00    Types: Cigarettes  . Smokeless tobacco: Never Used  . Alcohol use 1.2 oz/week    2 Cans of beer per week  . Drug use: No  . Sexual activity: Not on file   Other Topics Concern  . Not on file   Social History Narrative   Patient is single, retired   Patient is right handed   Education level is high school   Caffeine consumption is 1 cup daily     No Known Allergies  Family History  Problem Relation Age of Onset  . Hypertension Mother   . Cancer Mother   . Liver disease Father       Prior to Admission medications   Medication  Sig Start Date End Date Taking? Authorizing Provider  aspirin EC 81 MG tablet Take 81 mg by mouth daily.   Yes Historical Provider, MD  butalbital-acetaminophen-caffeine (FIORICET, ESGIC) [phone] MG tablet Take 1 tablet by mouth every 6 (six) hours as needed for headache. 02/25/16 02/24/17 Yes Olivia Canter Sam, PA-C  famotidine (PEPCID) 20 MG tablet Take 20 mg by mouth 2 (two) times daily.   Yes Historical Provider, MD  nortriptyline (PAMELOR) 25 MG capsule Take 1 capsule (25 mg total) by mouth at bedtime. 02/29/16  Yes Golden Circle, FNP  valsartan-hydrochlorothiazide (DIOVAN HCT) 160-25 MG tablet Take 1 tablet by mouth daily. 02/08/16  Yes Golden Circle, FNP  metoprolol succinate (TOPROL-XL) 25 MG 24 hr tablet Take 1 tablet (25 mg total) by mouth daily. Patient not taking: Reported on 03/19/2016 02/08/16   Golden Circle, FNP  nitroGLYCERIN (NITROSTAT) 0.4 MG SL tablet Place 1 tablet (0.4 mg total) under the tongue every 5 (five) minutes x 3 doses as needed for chest pain. 10/18/15   Erma Heritage, PA  omeprazole (PRILOSEC) 20 MG capsule Take 1 capsule (20 mg total) by mouth daily. Patient not taking: Reported on 03/19/2016 02/22/16   Hyman Bible, PA-C    Physical Exam:    Vitals:   03/19/16 1413 03/19/16 1422 03/19/16 1451 03/19/16 1522  BP: 128/76 125/78 144/83 (!) 154/74  Pulse: 62 (!) 56 (!) 52 (!) 56  Resp: '19 16 19 16  '$ Temp:    97.5 F (36.4 C)  TempSrc:    Oral  SpO2: 96% 96% 97% 100%  Weight:    89.9 kg (198 lb 4.8 oz)  Height:    '5\' 11"'$  (1.803 m)       Constitutional: NAD, calm, comfortable  Vitals:   03/19/16 1413 03/19/16 1422 03/19/16 1451 03/19/16 1522  BP: 128/76 125/78 144/83 (!) 154/74  Pulse: 62 (!) 56 (!) 52 (!) 56  Resp: '19 16 19 16  '$ Temp:    97.5 F (36.4 C)  TempSrc:    Oral  SpO2: 96% 96% 97% 100%  Weight:    89.9 kg (198 lb 4.8 oz)  Height:    '5\' 11"'$  (1.803 m)   Eyes: PERRL, lids and conjunctivae normal ENMT: Mucous membranes are moist.  Posterior pharynx clear of any exudate or lesions.Normal dentition.  Neck: normal, supple, no masses, no thyromegaly Respiratory: clear to auscultation bilaterally, no wheezing, no crackles. Atelectatic sounds are noted. Normal respiratory effort. No accessory muscle use.  Cardiovascular: Regular rate and rhythm, no murmurs / rubs / gallops. No extremity edema. 2+ pedal pulses. No carotid bruits.  Abdomen: no tenderness, no masses  palpated. No hepatosplenomegaly. Bowel sounds positive.  Musculoskeletal: no clubbing / cyanosis. No joint deformity upper and lower extremities. Good ROM, no contractures. Normal muscle tone.  Skin: no rashes, lesions, ulcers.  Neurologic: CN 2-12 grossly intact. Sensation intact, DTR normal. Strength 5/5 in all 4.  Psychiatric: Normal judgment and insight. Alert and oriented x 3. Normal mood.     Labs on Admission: I have personally reviewed following labs and imaging studies  CBC:  Recent Labs Lab 03/19/16 0942  WBC 5.9  HGB 16.2  HCT 48.4  MCV 94.9  PLT 962    Basic Metabolic Panel:  Recent Labs Lab 03/19/16 0942  NA 138  K 3.5  CL 100*  CO2 28  GLUCOSE 128*  BUN 7  CREATININE 0.87  CALCIUM 9.7    GFR: Estimated Creatinine Clearance: 79.3 mL/min (by C-G formula based on SCr of 0.87 mg/dL).  Liver Function Tests: No results for input(s): AST, ALT, ALKPHOS, BILITOT, PROT, ALBUMIN in the last 168 hours. No results for input(s): LIPASE, AMYLASE in the last 168 hours. No results for input(s): AMMONIA in the last 168 hours.  Coagulation Profile: No results for input(s): INR, PROTIME in the last 168 hours.  Cardiac Enzymes: No results for input(s): CKTOTAL, CKMB, CKMBINDEX, TROPONINI in the last 168 hours.  BNP (last 3 results)  Recent Labs  04/03/15 0931  PROBNP 15.10    HbA1C: No results for input(s): HGBA1C in the last 72 hours.  CBG: No results for input(s): GLUCAP in the last 168 hours.  Lipid Profile: No results for  input(s): CHOL, HDL, LDLCALC, TRIG, CHOLHDL, LDLDIRECT in the last 72 hours.  Thyroid Function Tests: No results for input(s): TSH, T4TOTAL, FREET4, T3FREE, THYROIDAB in the last 72 hours.  Anemia Panel: No results for input(s): VITAMINB12, FOLATE, FERRITIN, TIBC, IRON, RETICCTPCT in the last 72 hours.  Urine analysis:    Component Value Date/Time   COLORURINE YELLOW 10/19/2015 [place] 10/19/2015 0947   LABSPEC 1.011 10/19/2015 0947   PHURINE 6.0 10/19/2015 0947   GLUCOSEU NEGATIVE 10/19/2015 0947   HGBUR NEGATIVE 10/19/2015 0947   BILIRUBINUR NEGATIVE 10/19/2015 0947   KETONESUR NEGATIVE 10/19/2015 0947   PROTEINUR NEGATIVE 10/19/2015 0947   UROBILINOGEN 0.2 07/07/2014 0930   NITRITE NEGATIVE 10/19/2015 0947   LEUKOCYTESUR NEGATIVE 10/19/2015 0947    Sepsis Labs: '@LABRCNTIP'$ (procalcitonin:4,lacticidven:4) )No results found for this or any previous visit (from the past 240 hour(s)).   Radiological Exams on Admission: Dg Chest 2 View  Result Date: 03/19/2016 CLINICAL DATA:  Mid chest pain and headache, intermittent over the last few months. Productive cough. EXAM: CHEST  2 VIEW COMPARISON:  02/22/2016 and previous FINDINGS: The heart is enlarged. There is aortic atherosclerosis. The lungs are clear without infiltrate, collapse or effusion. Ordinary degenerative changes affect the spine. IMPRESSION: Cardiomegaly.  Aortic atherosclerosis.  No active pulmonary disease. Electronically Signed   By: Nelson Chimes M.D.   On: 03/19/2016 10:40    EKG: Independently reviewed.  Assessment/Plan Active Problems:   Essential hypertension, benign   Headache   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HCC)   GERD (gastroesophageal reflux disease)   Nicotine addiction   Alcohol abuse, in remission   Left pontine CVA (HCC)   Chest pain     Chest pain syndrome, cardiac versus musculoskeletal vs GI  HEART score 5-6 troponin 0 EKG without acute coronary changes. no recent echo, last 2013.Nuclear  myocardial perfusion test performance May 2017 shows EF at 44% with no regional wall motion  abnormalities, intermediate risk study.Received nitroglycerin times 3 with relief.  Admit to Telemetry/ Observation Chest pain order set Cycle troponins EKG in am continue ASA, O2 and NTG as needed Continue preadmission beta blocker and nitrate Statins  GI cocktail Check Lipid panel  Hb A1C Consult to Cards,Dr. Croitoru to see  Tobacco cessation was recommended    GERD, Continue PPI GI cocktail to be given    Migraine Headaches Continue meds    Hypertension BP (!) 154/74  56     Controlled Continue home anti-hypertensive medications    DVT prophylaxis:Heparin sq   Code Status:   Full   Family Communication:  Discussed with patient  Disposition Plan: Expect patient to be discharged to home after condition improves Consults called:  Cardiolgy Dr Sallyanne Kuster  Admission status:Tele  Obs    Sharene Butters E, PA-C [facility]   If 7PM-7AM, please contact night-coverage [email or web address] Password [facility]  03/19/2016, 3:46 PM

## 2016-03-19 NOTE — ED Triage Notes (Signed)
Patient complains of SSCP  Since 0200. States that it awoke him from sleep. Denies associated symptoms. Hx of CAD

## 2016-03-19 NOTE — ED Provider Notes (Signed)
Andre [name] DEPT Provider Note   CSN: [id number] Arrival date & time: 03/19/16  8921     History   Chief Complaint Chief Complaint  Patient presents with  . Chest Pain    HPI Andre [name] is a 75 y.o. male.  HPI  75 year old male with a history of hypertension, TIA, and CAD, nonocclusive, presents with chest pain. States started around midnight. He has had chest pain, mostly at night for several weeks, up to one month. Comes and goes, does not occur every day. Also has a nightly cough for about one month. The chest pain does not radiate including no back pain or arm pain. There is no abdominal pain, nausea, vomiting, or diaphoresis. Pain is currently an 8/10. Similar pain that he was seen for on 9/12.  Past Medical History:  Diagnosis Date  . Arthritis   . Coronary artery disease, non-occlusive    a. 06/2011: cath for abnormal exercise echo: Only 30-40% proximal RCA. Otherwise normal. b. 10/2015: cath for abnormal NST and atypical CP --> showed mild 30-40% stenosis in the prox-distal RCA  . GERD (gastroesophageal reflux disease)   . HA (headache)   . Hypertension   . 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[place])   . TIA (transient ischemic attack)   . Weakness     Patient Active Problem List   Diagnosis Date Noted  . Back pain 02/29/2016  . Abnormal stress test 10/18/2015  . Chest pain with moderate risk for cardiac etiology 09/30/2015  . Erectile dysfunction 02/25/2015  . Cigarette smoker 02/10/2015  . COPD GOLD 0 11/08/2014  . Upper airway cough syndrome 08/10/2014  . Hemorrhoids 02/11/2014  . Constipation 05/30/2013  . Left pontine CVA ([place]) 01/06/2013  . Headache 10/23/2011  . 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[place]) 10/23/2011  . GERD (gastroesophageal reflux disease) 10/23/2011  . Nicotine addiction 10/23/2011  . 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 Cervical stenosis of spinal canal 10/23/2011  . Essential hypertension, benign 07/10/2011    Past Surgical History:  Procedure Laterality Date  .  CARDIAC CATHETERIZATION  January 2013   Proximal RCA 30-40%. Otherwise normal.  . CARDIAC CATHETERIZATION N/A 10/18/2015   Procedure: Left Heart Cath and Coronary Angiography;  Surgeon: Jettie Booze, MD;  Location: [place] [facility];  Service: Cardiovascular;  Laterality: N/A;  . NM MYOVIEW LTD  March 2015   LOW RISK. No scar or ischemia. Normal EF (55%). No RWMA  . surgical excision of rectal cancer         Home Medications    Prior to Admission medications   Medication Sig Start Date End Date Taking? Authorizing Provider  aspirin EC 81 MG tablet Take 81 mg by mouth daily.   Yes Historical Provider, MD  butalbital-acetaminophen-caffeine (FIORICET, ESGIC) [phone] MG tablet Take 1 tablet by mouth every 6 (six) hours as needed for headache. 02/25/16 02/24/17 Yes Olivia Canter Sam, PA-C  famotidine (PEPCID) 20 MG tablet Take 20 mg by mouth 2 (two) times daily.   Yes Historical Provider, MD  nortriptyline (PAMELOR) 25 MG capsule Take 1 capsule (25 mg total) by mouth at bedtime. 02/29/16  Yes Golden Circle, FNP  valsartan-hydrochlorothiazide (DIOVAN HCT) 160-25 MG tablet Take 1 tablet by mouth daily. 02/08/16  Yes Golden Circle, FNP  metoprolol succinate (TOPROL-XL) 25 MG 24 hr tablet Take 1 tablet (25 mg total) by mouth daily. Patient not taking: Reported on 03/19/2016 02/08/16   Golden Circle, FNP  nitroGLYCERIN (NITROSTAT) 0.4 MG SL tablet Place 1 tablet (0.4 mg  total) under the tongue every 5 (five) minutes x 3 doses as needed for chest pain. 10/18/15   Erma Heritage, PA  omeprazole (PRILOSEC) 20 MG capsule Take 1 capsule (20 mg total) by mouth daily. Patient not taking: Reported on 03/19/2016 02/22/16   Hyman Bible, PA-C    Family History Family History  Problem Relation Age of Onset  . Hypertension Mother   . Cancer Mother   . Liver disease Father     Social History Social History  Substance Use Topics  . Smoking status: Current Some Day Smoker    Packs/day: 0.30      Years: 47.00    Types: Cigarettes  . Smokeless tobacco: Never Used  . Alcohol use 1.2 oz/week    2 Cans of beer per week     Allergies   Review of patient's allergies indicates no known allergies.   Review of Systems Review of Systems  Respiratory: Negative for shortness of breath.   Cardiovascular: Positive for chest pain. Negative for leg swelling.  Gastrointestinal: Negative for abdominal pain.  Musculoskeletal: Negative for back pain.  All other systems reviewed and are negative.    Physical Exam Updated Vital Signs BP 142/75 (BP Location: Right Arm)   Pulse (!) 55   Temp 98.4 F (36.9 C) (Oral)   Resp 15   Ht '5\' 11"'$  (1.803 m)   Wt 195 lb (88.5 kg)   SpO2 100%   BMI 27.20 kg/m   Physical Exam  Constitutional: He is oriented to person, place, and time. He appears well-developed and well-nourished. No distress.  HENT:  Head: Normocephalic and atraumatic.  Right Ear: External ear normal.  Left Ear: External ear normal.  Nose: Nose normal.  Eyes: Right eye exhibits no discharge. Left eye exhibits no discharge.  Neck: Neck supple.  Cardiovascular: Normal rate, regular rhythm and normal heart sounds.   Pulmonary/Chest: Effort normal and breath sounds normal. He has no rales.  Abdominal: Soft. There is no tenderness.  Musculoskeletal: He exhibits no edema.  Neurological: He is alert and oriented to person, place, and time.  Skin: Skin is warm and dry. He is not diaphoretic.  Nursing note and vitals reviewed.    ED Treatments / Results  Labs (all labs ordered are listed, but only abnormal results are displayed) Labs Reviewed  BASIC METABOLIC PANEL - Abnormal; Notable for the following:       Result Value   Chloride 100 (*)    Glucose, Bld 128 (*)    All other components within normal limits  CBC  I-STAT TROPOININ, ED  I-STAT TROPOININ, ED    EKG  EKG Interpretation  Date/Time:  Sunday March 19 2016 09:28:31 EDT Ventricular Rate:  62 PR  Interval:  204 QRS Duration: 92 QT Interval:  422 QTC Calculation: 428 R Axis:   -3 Text Interpretation:  Normal sinus rhythm Nonspecific ST and T wave abnormality Abnormal ECG similar to Sept 12 2017 Confirmed by Regenia Skeeter MD, [place] [phone]) on 03/19/2016 10:30:04 AM       Radiology Dg Chest 2 View  Result Date: 03/19/2016 CLINICAL DATA:  Mid chest pain and headache, intermittent over the last few months. Productive cough. EXAM: CHEST  2 VIEW COMPARISON:  02/22/2016 and previous FINDINGS: The heart is enlarged. There is aortic atherosclerosis. The lungs are clear without infiltrate, collapse or effusion. Ordinary degenerative changes affect the spine. IMPRESSION: Cardiomegaly.  Aortic atherosclerosis.  No active pulmonary disease. Electronically Signed   By: Jan Fireman.D.  On: 03/19/2016 10:40    Procedures Procedures (including critical care time)  Medications Ordered in ED Medications  famotidine (PEPCID) IVPB 20 mg premix (not administered)  acetaminophen (TYLENOL) tablet 650 mg (not administered)  nitroGLYCERIN (NITROSTAT) SL tablet 0.4 mg (not administered)  aspirin chewable tablet 324 mg (324 mg Oral Given 03/19/16 1058)  gi cocktail (Maalox,Lidocaine,Donnatal) (30 mLs Oral Given 03/19/16 1058)     Initial Impression / Assessment and Plan / ED Course  I have reviewed the triage vital signs and the nursing notes.  Pertinent labs & imaging results that were available during my care of the patient were reviewed by me and considered in my medical decision making (see chart for details).  Clinical Course  Comment By Time  This sounds more GI. Will give GI cocktail, ASA, and monitor. Repeat troponin. Recent negative cath is reassuring. Sherwood Gambler, MD 10/08 1047    Patient symptoms are atypical and probably GI related however he is not improved with GI cocktail or Pepcid. Nitroglycerin with some improvement. In May of this year he had nonobstructive but mild disease. Given  his risk factors and no relief of chest pain thinking is to be observed for further monitoring and probably cardiology consult.  Final Clinical Impressions(s) / ED Diagnoses   Final diagnoses:  Nonspecific chest pain    New Prescriptions New Prescriptions   No medications on file     Sherwood Gambler, MD 03/19/16 1531

## 2016-03-19 NOTE — ED Notes (Signed)
Pt. Requesting water. MD verified this was okay.

## 2016-03-19 NOTE — Consult Note (Signed)
Reason for Consult: Chest pain, abnormal ECG  Requesting Physician: Janne Napoleon  Cardiologist: Ellyn Hack  HPI: This is a 75 y.o. male with a past medical history significant for minor coronary atherosclerosis (30-40% right coronary artery stenosis by recent cardiac catheterization in May 2017, no progression compared to 2013), hypertension, reportedly remote TIA, gastroesophageal reflux disease presents for evaluation of chest pain.   As before, the description of his chest pain is variable both in location and quality. His symptoms occurred last night while lying in bed around midnight. He walked around in the house all night long trying to get some relief. He took Tums without any benefit. This morning he took Alka-Seltzer. In the past this has relieved his symptoms, but not today. His other major complaint is recumbent cough. In the emergency room he did have partial relief with nitroglycerin although this was delayed long after drug administration. He is currently pain-free.   Similar symptoms were treated in the emergency room at [facility] roughly 1 month ago and responded well to antacids. Since then he has had chest discomfort off and on at least a few times every week.  The patient specifically denies any dyspnea at rest or with exertion, orthopnea, paroxysmal nocturnal dyspnea, syncope, palpitations, focal neurological deficits, intermittent claudication, lower extremity edema, unexplained weight gain, hemoptysis or wheezing. It is very difficult to understand and sometimes. His responses are always hesitant and he appears uncertain of many answers.  His electrocardiogram shows chronic T-wave inversion in leads V1-V3, unchanged over numerous tracings, all the way back to 2012. No dynamic changes are seen today. His initial set of cardiac enzymes is normal.  He smokes a few cigarettes every day, with an overall lifetime exposure of under 15 pack years. He does not have  diabetes mellitus. His medication list at home includes both famotidine and omeprazole, but I am having some difficulty understanding whether he is actually taking both these medications.  PMHx:  Past Medical History:  Diagnosis Date  . Arthritis   . Coronary artery disease, non-occlusive    a. 06/2011: cath for abnormal exercise echo: Only 30-40% proximal RCA. Otherwise normal. b. 10/2015: cath for abnormal NST and atypical CP --> showed mild 30-40% stenosis in the prox-distal RCA  . GERD (gastroesophageal reflux disease)   . HA (headache)   . Hypertension   . 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[place])   . TIA (transient ischemic attack)   . Weakness    Past Surgical History:  Procedure Laterality Date  . CARDIAC CATHETERIZATION  January 2013   Proximal RCA 30-40%. Otherwise normal.  . CARDIAC CATHETERIZATION N/A 10/18/2015   Procedure: Left Heart Cath and Coronary Angiography;  Surgeon: Jettie Booze, MD;  Location: [place] [facility];  Service: Cardiovascular;  Laterality: N/A;  . NM MYOVIEW LTD  March 2015   LOW RISK. No scar or ischemia. Normal EF (55%). No RWMA  . surgical excision of rectal cancer      FAMHx: Family History  Problem Relation Age of Onset  . Hypertension Mother   . Cancer Mother   . Liver disease Father     SOCHx:  reports that he has been smoking Cigarettes.  He has a 14.10 pack-year smoking history. He has never used smokeless tobacco. He reports that he drinks about 1.2 oz of alcohol per week . He reports that he does not use drugs.  ALLERGIES: No Known Allergies  ROS: Pertinent items noted in HPI and remainder of comprehensive ROS  otherwise negative.  HOME MEDICATIONS: Prescriptions Prior to Admission  Medication Sig Dispense Refill Last Dose  . aspirin EC 81 MG tablet Take 81 mg by mouth daily.   03/19/2016 at Unknown time  . butalbital-acetaminophen-caffeine (FIORICET, ESGIC) 50-325-40 MG tablet Take 1 tablet by mouth every 6 (six) hours as needed for headache.  20 tablet 0 Past Month at Unknown time  . famotidine (PEPCID) 20 MG tablet Take 20 mg by mouth 2 (two) times daily.   03/18/2016 at Unknown time  . nortriptyline (PAMELOR) 25 MG capsule Take 1 capsule (25 mg total) by mouth at bedtime. 30 capsule 0 Past Month at Unknown time  . valsartan-hydrochlorothiazide (DIOVAN HCT) 160-25 MG tablet Take 1 tablet by mouth daily. 90 tablet 0 03/19/2016 at Unknown time  . metoprolol succinate (TOPROL-XL) 25 MG 24 hr tablet Take 1 tablet (25 mg total) by mouth daily. (Patient not taking: Reported on 03/19/2016) 90 tablet 3 Not Taking at Unknown time  . nitroGLYCERIN (NITROSTAT) 0.4 MG SL tablet Place 1 tablet (0.4 mg total) under the tongue every 5 (five) minutes x 3 doses as needed for chest pain. 25 tablet 3 rescue  . omeprazole (PRILOSEC) 20 MG capsule Take 1 capsule (20 mg total) by mouth daily. (Patient not taking: Reported on 03/19/2016) 30 capsule 0 Not Taking at Unknown time    HOSPITAL MEDICATIONS: I have reviewed the patient's current medications. Scheduled: . heparin  5,000 Units Subcutaneous Q8H  . [START ON 03/20/2016] hydrochlorothiazide  25 mg Oral Daily  . [START ON 03/20/2016] irbesartan  150 mg Oral Daily  . nortriptyline  25 mg Oral QHS   Continuous: . sodium chloride      VITALS: Blood pressure (!) 154/74, pulse (!) 56, temperature 97.5 F (36.4 C), temperature source Oral, resp. rate 16, height '5\' 11"'$  (1.803 m), weight 198 lb 4.8 oz (89.9 kg), SpO2 100 %.  PHYSICAL EXAM:  General: Alert, oriented x3, no distress Head: no evidence of trauma, PERRL, EOMI, no exophtalmos or lid lag, no myxedema, no xanthelasma; normal ears, nose and oropharynx Neck: Normal jugular venous pulsations and no hepatojugular reflux; brisk carotid pulses without delay and no carotid bruits Chest: clear to auscultation, no signs of consolidation by percussion or palpation, normal fremitus, symmetrical and full respiratory excursions Cardiovascular: normal position  and quality of the apical impulse, regular rhythm, normal first heart sound and normal second heart sound, no rubs or gallops, no murmur Abdomen: no tenderness or distention, no masses by palpation, no abnormal pulsatility or arterial bruits, normal bowel sounds, no hepatosplenomegaly Extremities: no clubbing, cyanosis;  no edema; 2+ radial, ulnar and brachial pulses bilaterally; 2+ right femoral, posterior tibial and dorsalis pedis pulses; 2+ left femoral, posterior tibial and dorsalis pedis pulses; no subclavian or femoral bruits Neurological: grossly nonfocal   LABS  CBC  Recent Labs  03/19/16 0942  WBC 5.9  HGB 16.2  HCT 48.4  MCV 94.9  PLT 366   Basic Metabolic Panel  Recent Labs  03/19/16 0942  NA 138  K 3.5  CL 100*  CO2 28  GLUCOSE 128*  BUN 7  CREATININE 0.87  CALCIUM 9.7    IMAGING: Dg Chest 2 View  Result Date: 03/19/2016 CLINICAL DATA:  Mid chest pain and headache, intermittent over the last few months. Productive cough. EXAM: CHEST  2 VIEW COMPARISON:  02/22/2016 and previous FINDINGS: The heart is enlarged. There is aortic atherosclerosis. The lungs are clear without infiltrate, collapse or effusion. Ordinary degenerative changes affect  the spine. IMPRESSION: Cardiomegaly.  Aortic atherosclerosis.  No active pulmonary disease. Electronically Signed   By: Nelson Chimes M.D.   On: 03/19/2016 10:40    ECG: Normal sinus rhythm with anterior T-wave inversion, very similar to previous tracings dating to 2012  TELEMETRY: NSR  IMPRESSION: 1. Noncardiac chest pain, probable esophageal spasm. This may also improve with use of nitrates. Combivent cough also suggestive of acid reflux. 2. Abnormal ECG (chronic, without dynamic changes), possibly persistent juvenile repolarization pattern 3. Minimal coronary atherosclerosis by recent coronary angiography 4. Ongoing tobacco abuse 5. Aortic atherosclerosis 6. Mildly elevated LVEDP at cath in May 2017, no signs of  congestive heart failure by exam or chest x-ray today.  RECOMMENDATION: 1. Switch to a more potent PPI 2. Behavioral changes to avoid gastroesophageal reflux (raise head of bed, smaller volume meals, no meals late in the evening, etc.) 3. Monitor overnight and repeat cardiac enzymes, but strongly doubt cardiac etiology for his symptoms 4. Smoking cessation 5. Unless cardiac enzymes are abnormal, do not recommend intravenous heparin 6. No plan for further inpatient cardiology workup, unless unexpected increase in cardiac enzymes. 7. Refer to gastroenterology  Time Spent Directly with Patient: 30 minutes  Sanda Klein, MD, [facility] [phone] office [phone] pager   03/19/2016, 3:26 PM

## 2016-03-19 NOTE — ED Notes (Signed)
MD at bedside. 

## 2016-03-20 DIAGNOSIS — K219 Gastro-esophageal reflux disease without esophagitis: Secondary | ICD-10-CM | POA: Diagnosis not present

## 2016-03-20 DIAGNOSIS — F17210 Nicotine dependence, cigarettes, uncomplicated: Secondary | ICD-10-CM

## 2016-03-20 DIAGNOSIS — R079 Chest pain, unspecified: Secondary | ICD-10-CM | POA: Diagnosis not present

## 2016-03-20 DIAGNOSIS — I10 Essential (primary) hypertension: Secondary | ICD-10-CM | POA: Diagnosis not present

## 2016-03-20 LAB — CBC
HCT: 46.7 % (ref 39.0–52.0)
Hemoglobin: 15.1 g/dL (ref 13.0–17.0)
MCH: 30.9 pg (ref 26.0–34.0)
MCHC: 32.3 g/dL (ref 30.0–36.0)
MCV: 95.7 fL (ref 78.0–100.0)
PLATELETS: 201 10*3/uL (ref 150–400)
RBC: 4.88 MIL/uL (ref 4.22–5.81)
RDW: 14.7 % (ref 11.5–15.5)
WBC: 5.3 10*3/uL (ref 4.0–10.5)

## 2016-03-20 LAB — COMPREHENSIVE METABOLIC PANEL
ALBUMIN: 3.6 g/dL (ref 3.5–5.0)
ALT: 23 U/L (ref 17–63)
AST: 26 U/L (ref 15–41)
Alkaline Phosphatase: 77 U/L (ref 38–126)
Anion gap: 10 (ref 5–15)
BUN: 7 mg/dL (ref 6–20)
CHLORIDE: 100 mmol/L — AB (ref 101–111)
CO2: 27 mmol/L (ref 22–32)
Calcium: 9 mg/dL (ref 8.9–10.3)
Creatinine, Ser: 0.81 mg/dL (ref 0.61–1.24)
GFR calc Af Amer: >60 mL/min (ref >60)
GFR calc non Af Amer: >60 mL/min (ref >60)
Glucose, Bld: 103 mg/dL — ABNORMAL HIGH (ref 65–99)
POTASSIUM: 3.3 mmol/L — AB (ref 3.5–5.1)
Sodium: 137 mmol/L (ref 135–145)
TOTAL PROTEIN: 6.3 g/dL — AB (ref 6.5–8.1)
Total Bilirubin: 0.8 mg/dL (ref 0.3–1.2)

## 2016-03-20 LAB — LIPID PANEL
CHOL/HDL RATIO: 4.1 ratio
CHOLESTEROL: 147 mg/dL (ref 0–200)
HDL: 36 mg/dL — AB (ref >40)
LDL Cholesterol: 76 mg/dL (ref 0–99)
Triglycerides: 176 mg/dL — ABNORMAL HIGH (ref <150)
VLDL: 35 mg/dL (ref 0–40)

## 2016-03-20 LAB — HEMOGLOBIN A1C
Hgb A1c MFr Bld: 6.6 % — ABNORMAL HIGH (ref 4.8–5.6)
MEAN PLASMA GLUCOSE: 143 mg/dL

## 2016-03-20 MED ORDER — OMEPRAZOLE 20 MG PO CPDR: 20.0000 mg | DELAYED_RELEASE_CAPSULE | Freq: Two times a day (BID) | ORAL | 0 refills | Status: DC

## 2016-03-20 MED ORDER — POTASSIUM CHLORIDE CRYS ER 20 MEQ PO TBCR
40.0000 meq | EXTENDED_RELEASE_TABLET | Freq: Once | ORAL | Status: AC
  Administered 2016-03-20: 40 meq via ORAL
  Filled 2016-03-20: qty 2

## 2016-03-20 NOTE — Progress Notes (Signed)
Patient Name: Airon Sahni Date of Encounter: 03/20/2016  Hospital Problem List     Active Problems:   Essential hypertension, benign   Headache   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HCC)   GERD (gastroesophageal reflux disease)   Nicotine addiction   Alcohol abuse, in remission   Left pontine CVA (HCC)   Chest pain    Subjective   Feeling much better this morning.   Inpatient Medications    . heparin  5,000 Units Subcutaneous Q8H  . hydrochlorothiazide  25 mg Oral Daily  . irbesartan  150 mg Oral Daily  . nortriptyline  25 mg Oral QHS  . pantoprazole  40 mg Oral BID  . potassium chloride  40 mEq Oral Once    Vital Signs    Vitals:   03/19/16 1451 03/19/16 1522 03/19/16 2135 03/20/16 0622  BP: 144/83 (!) 154/74 (!) 145/72 124/70  Pulse: (!) 52 (!) 56 (!) 52 (!) 53  Resp: '19 16 16 16  '$ Temp:  97.5 F (36.4 C) 97.5 F (36.4 C) 97.6 F (36.4 C)  TempSrc:  Oral Oral Oral  SpO2: 97% 100% 100% 100%  Weight:  198 lb 4.8 oz (89.9 kg)  196 lb 3.2 oz (89 kg)  Height:  '5\' 11"'$  (1.803 m)      Intake/Output Summary (Last 24 hours) at 03/20/16 1014 Last data filed at 03/19/16 1822  Gross per 24 hour  Intake             1170 ml  Output              210 ml  Net              960 ml   Filed Weights   03/19/16 1051 03/19/16 1522 03/20/16 0622  Weight: 195 lb (88.5 kg) 198 lb 4.8 oz (89.9 kg) 196 lb 3.2 oz (89 kg)    Physical Exam    General: Pleasant older AA male, NAD. Neuro: Alert and oriented X 3. Moves all extremities spontaneously. Psych: Normal affect. HEENT:  Normal  Neck: Supple without bruits or JVD. Lungs:  Resp regular and unlabored, CTA. Heart: RRR no s3, s4, or murmurs. Abdomen: Soft, non-tender, non-distended, BS + x 4.  Extremities: No clubbing, cyanosis or edema. DP/PT/Radials 2+ and equal bilaterally.  Labs    CBC  Recent Labs  03/19/16 0942 03/20/16 0326  WBC 5.9 5.3  HGB 16.2 15.1  HCT 48.4 46.7  MCV 94.9 95.7  PLT 207 366   Basic Metabolic  Panel  Recent Labs  03/19/16 0942 03/20/16 0326  NA 138 137  K 3.5 3.3*  CL 100* 100*  CO2 28 27  GLUCOSE 128* 103*  BUN 7 7  CREATININE 0.87 0.81  CALCIUM 9.7 9.0   Liver Function Tests  Recent Labs  03/20/16 0326  AST 26  ALT 23  ALKPHOS 77  BILITOT 0.8  PROT 6.3*  ALBUMIN 3.6   No results for input(s): LIPASE, AMYLASE in the last 72 hours. Cardiac Enzymes  Recent Labs  03/19/16 1521 03/19/16 1759 03/19/16 2020  TROPONINI <0.03 <0.03 <0.03   BNP Invalid input(s): POCBNP D-Dimer No results for input(s): DDIMER in the last 72 hours. Hemoglobin A1C  Recent Labs  03/19/16 1521  HGBA1C 6.6*   Fasting Lipid Panel  Recent Labs  03/20/16 0326  CHOL 147  HDL 36*  LDLCALC 76  TRIG 176*  CHOLHDL 4.1   Thyroid Function Tests No results for input(s): TSH, T4TOTAL, T3FREE, THYROIDAB in the  last 72 hours.  Invalid input(s): FREET3  Telemetry    SR with PACs, PVCs  ECG    SR with anterior TWI noted on previous tracings.  Radiology      Assessment & Plan    75 y.o. male with a past medical history significant for minor coronary atherosclerosis (30-40% right coronary artery stenosis by recent cardiac catheterization in May 2017, no progression compared to 2013), hypertension, reportedly remote TIA, gastroesophageal reflux disease presents for evaluation of chest pain.   1. Noncardiac chest pain: Feels much better this morning. Protonix increased BID yesterday. Trop cycled and neg x3. Discussed diet and behavioral modifications with the patient. Also stressed the importance of smoking cessation. No further work up indicated.  2. Abnormal EKG: noted previous TWI in anterior leads. No anginal symptoms  3. Minimal CAD by recent cath 5/17  Signed, Reino Bellis NP-C Pager [phone]

## 2016-03-20 NOTE — Progress Notes (Signed)
Pt discharged to home, educated to follow up with GI MD, discharge education completed, verbalized understanding of d/c instruction, condition stable, accompanied by family member.  Edward Qualia RN

## 2016-03-20 NOTE — Discharge Summary (Signed)
Physician Discharge Summary  Andre [name] JSE:[id number] DOB: Oct 19, 1940 DOA: 03/19/2016  PCP: Mauricio Po, FNP  Admit date: 03/19/2016 Discharge date: 03/20/2016   Recommendations for Outpatient Follow-Up:   1. May need referral for EGD if patient takes PPI and not improved   Discharge Diagnosis:   Active Problems:   Essential hypertension, benign   Headache   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HCC)   GERD (gastroesophageal reflux disease)   Nicotine addiction   Alcohol abuse, in remission   Left pontine CVA ([place])   Chest pain   Discharge disposition:  Home.  Discharge Condition: Improved.  Diet recommendation: Low sodium, heart healthy- GERD  Wound care: None.   History of Present Illness:   Andre [name] is a 75 y.o. male with a Past Medical History of hypertension, chronic migraines, rectal cancer not on treatment, history of TIAs, GERD, nonocclusive coronary artery disease with a waxing and waning symptoms since May 2017 but has not been followed up with a cardiologist presents today with worsening chest pains relieved by nitroglycerin 3.   Patient says the chest pain is described as pressure-like and comes and goes.  He still smokes. Denies alcohol   Hospital Course by Problem:   GERD 1.  PPI 2. Behavioral changes to avoid gastroesophageal reflux (raise head of bed, smaller volume meals, no meals late in the evening, etc.) 3. Negative CE 4. Smoking cessation 5. Refer to gastroenterology    Medical Consultants:    cards   Discharge Exam:   Vitals:   03/19/16 2135 03/20/16 0622  BP: (!) 145/72 124/70  Pulse: (!) 52 (!) 53  Resp: 16 16  Temp: 97.5 F (36.4 C) 97.6 F (36.4 C)   Vitals:   03/19/16 1451 03/19/16 1522 03/19/16 2135 03/20/16 0622  BP: 144/83 (!) 154/74 (!) 145/72 124/70  Pulse: (!) 52 (!) 56 (!) 52 (!) 53  Resp: '19 16 16 16  '$ Temp:  97.5 F (36.4 C) 97.5 F (36.4 C) 97.6 F (36.4 C)  TempSrc:  Oral Oral Oral  SpO2: 97% 100% 100% 100%    Weight:  89.9 kg (198 lb 4.8 oz)  89 kg (196 lb 3.2 oz)  Height:  '5\' 11"'$  (1.803 m)      Gen:  NAD    The results of significant diagnostics from this hospitalization (including imaging, microbiology, ancillary and laboratory) are listed below for reference.     Procedures and Diagnostic Studies:   Dg Chest 2 View  Result Date: 03/19/2016 CLINICAL DATA:  Mid chest pain and headache, intermittent over the last few months. Productive cough. EXAM: CHEST  2 VIEW COMPARISON:  02/22/2016 and previous FINDINGS: The heart is enlarged. There is aortic atherosclerosis. The lungs are clear without infiltrate, collapse or effusion. Ordinary degenerative changes affect the spine. IMPRESSION: Cardiomegaly.  Aortic atherosclerosis.  No active pulmonary disease. Electronically Signed   By: Nelson Chimes M.D.   On: 03/19/2016 10:40     Labs:   Basic Metabolic Panel:  Recent Labs Lab 03/19/16 0942 03/20/16 0326  NA 138 137  K 3.5 3.3*  CL 100* 100*  CO2 28 27  GLUCOSE 128* 103*  BUN 7 7  CREATININE 0.87 0.81  CALCIUM 9.7 9.0   GFR Estimated Creatinine Clearance: 85.2 mL/min (by C-G formula based on SCr of 0.81 mg/dL). Liver Function Tests:  Recent Labs Lab 03/20/16 0326  AST 26  ALT 23  ALKPHOS 77  BILITOT 0.8  PROT 6.3*  ALBUMIN 3.6   No results for  input(s): LIPASE, AMYLASE in the last 168 hours. No results for input(s): AMMONIA in the last 168 hours. Coagulation profile No results for input(s): INR, PROTIME in the last 168 hours.  CBC:  Recent Labs Lab 03/19/16 0942 03/20/16 0326  WBC 5.9 5.3  HGB 16.2 15.1  HCT 48.4 46.7  MCV 94.9 95.7  PLT 207 201   Cardiac Enzymes:  Recent Labs Lab 03/19/16 1521 03/19/16 1759 03/19/16 2020  TROPONINI <0.03 <0.03 <0.03   BNP: Invalid input(s): POCBNP CBG: No results for input(s): GLUCAP in the last 168 hours. D-Dimer No results for input(s): DDIMER in the last 72 hours. Hgb A1c  Recent Labs  03/19/16 1521   HGBA1C 6.6*   Lipid Profile  Recent Labs  03/20/16 0326  CHOL 147  HDL 36*  LDLCALC 76  TRIG 176*  CHOLHDL 4.1   Thyroid function studies No results for input(s): TSH, T4TOTAL, T3FREE, THYROIDAB in the last 72 hours.  Invalid input(s): FREET3 Anemia work up No results for input(s): VITAMINB12, FOLATE, FERRITIN, TIBC, IRON, RETICCTPCT in the last 72 hours. Microbiology No results found for this or any previous visit (from the past 240 hour(s)).   Discharge Instructions:   Discharge Instructions    Diet - low sodium heart healthy    Complete by:  As directed    Discharge instructions    Complete by:  As directed    GERD Diet-- information attached Elevate head of bed   Increase activity slowly    Complete by:  As directed        Medication List    STOP taking these medications   famotidine 20 MG tablet Commonly known as:  PEPCID   metoprolol succinate 25 MG 24 hr tablet Commonly known as:  TOPROL-XL     TAKE these medications   aspirin EC 81 MG tablet Take 81 mg by mouth daily.   butalbital-acetaminophen-caffeine 50-325-40 MG tablet Commonly known as:  FIORICET, ESGIC Take 1 tablet by mouth every 6 (six) hours as needed for headache.   nitroGLYCERIN 0.4 MG SL tablet Commonly known as:  NITROSTAT Place 1 tablet (0.4 mg total) under the tongue every 5 (five) minutes x 3 doses as needed for chest pain.   nortriptyline 25 MG capsule Commonly known as:  PAMELOR Take 1 capsule (25 mg total) by mouth at bedtime.   omeprazole 20 MG capsule Commonly known as:  PRILOSEC Take 1 capsule (20 mg total) by mouth 2 (two) times daily before a meal. What changed:  when to take this   valsartan-hydrochlorothiazide 160-25 MG tablet Commonly known as:  DIOVAN HCT Take 1 tablet by mouth daily.         Time coordinating discharge: 35 min  Signed:  Juwuan Sedita U Havanna Groner   [facility] 03/20/2016, 12:59 PM

## 2016-03-20 NOTE — Discharge Instructions (Signed)

## 2016-03-25 ENCOUNTER — Encounter (HOSPITAL_COMMUNITY): Payer: Self-pay | Admitting: Emergency Medicine

## 2016-03-25 ENCOUNTER — Emergency Department (HOSPITAL_COMMUNITY): Payer: Medicare Other

## 2016-03-25 ENCOUNTER — Emergency Department (HOSPITAL_COMMUNITY)
Admission: EM | Admit: 2016-03-25 | Discharge: 2016-03-25 | Disposition: A | Discharge status: Home / Self Care | Payer: Medicare Other | Attending: Emergency Medicine | Admitting: Emergency Medicine

## 2016-03-25 DIAGNOSIS — Z85048 Personal history of other malignant neoplasm of rectum, rectosigmoid junction, and anus: Secondary | ICD-10-CM | POA: Insufficient documentation

## 2016-03-25 DIAGNOSIS — Z8673 Personal history of transient ischemic attack (TIA), and cerebral infarction without residual deficits: Secondary | ICD-10-CM | POA: Diagnosis not present

## 2016-03-25 DIAGNOSIS — Z79899 Other long term (current) drug therapy: Secondary | ICD-10-CM | POA: Insufficient documentation

## 2016-03-25 DIAGNOSIS — Z7982 Long term (current) use of aspirin: Secondary | ICD-10-CM | POA: Insufficient documentation

## 2016-03-25 DIAGNOSIS — R0602 Shortness of breath: Secondary | ICD-10-CM | POA: Diagnosis not present

## 2016-03-25 DIAGNOSIS — I10 Essential (primary) hypertension: Secondary | ICD-10-CM | POA: Diagnosis not present

## 2016-03-25 DIAGNOSIS — G44209 Tension-type headache, unspecified, not intractable: Secondary | ICD-10-CM | POA: Diagnosis not present

## 2016-03-25 DIAGNOSIS — R0789 Other chest pain: Secondary | ICD-10-CM | POA: Diagnosis not present

## 2016-03-25 DIAGNOSIS — F17210 Nicotine dependence, cigarettes, uncomplicated: Secondary | ICD-10-CM | POA: Insufficient documentation

## 2016-03-25 DIAGNOSIS — R079 Chest pain, unspecified: Secondary | ICD-10-CM

## 2016-03-25 DIAGNOSIS — I2510 Atherosclerotic heart disease of native coronary artery without angina pectoris: Secondary | ICD-10-CM | POA: Diagnosis not present

## 2016-03-25 DIAGNOSIS — K219 Gastro-esophageal reflux disease without esophagitis: Secondary | ICD-10-CM | POA: Insufficient documentation

## 2016-03-25 LAB — HEPATIC FUNCTION PANEL
ALBUMIN: 3.9 g/dL (ref 3.5–5.0)
ALT: 39 U/L (ref 17–63)
AST: 52 U/L — AB (ref 15–41)
Alkaline Phosphatase: 88 U/L (ref 38–126)
BILIRUBIN TOTAL: 0.7 mg/dL (ref 0.3–1.2)
Bilirubin, Direct: 0.1 mg/dL (ref 0.1–0.5)
Indirect Bilirubin: 0.6 mg/dL (ref 0.3–0.9)
TOTAL PROTEIN: 7.2 g/dL (ref 6.5–8.1)

## 2016-03-25 LAB — CBC
HEMATOCRIT: 46.6 % (ref 39.0–52.0)
HEMOGLOBIN: 15.6 g/dL (ref 13.0–17.0)
MCH: 31.4 pg (ref 26.0–34.0)
MCHC: 33.5 g/dL (ref 30.0–36.0)
MCV: 93.8 fL (ref 78.0–100.0)
Platelets: 174 10*3/uL (ref 150–400)
RBC: 4.97 MIL/uL (ref 4.22–5.81)
RDW: 14.8 % (ref 11.5–15.5)
WBC: 6.8 10*3/uL (ref 4.0–10.5)

## 2016-03-25 LAB — LIPASE, BLOOD: LIPASE: 22 U/L (ref 11–51)

## 2016-03-25 LAB — BASIC METABOLIC PANEL
ANION GAP: 9 (ref 5–15)
BUN: 6 mg/dL (ref 6–20)
CHLORIDE: 97 mmol/L — AB (ref 101–111)
CO2: 31 mmol/L (ref 22–32)
Calcium: 9.4 mg/dL (ref 8.9–10.3)
Creatinine, Ser: 0.82 mg/dL (ref 0.61–1.24)
GFR calc Af Amer: >60 mL/min (ref >60)
Glucose, Bld: 114 mg/dL — ABNORMAL HIGH (ref 65–99)
POTASSIUM: 3.4 mmol/L — AB (ref 3.5–5.1)
SODIUM: 137 mmol/L (ref 135–145)

## 2016-03-25 LAB — I-STAT TROPONIN, ED
TROPONIN I, POC: 0 ng/mL (ref 0.00–0.08)
Troponin i, poc: 0 ng/mL (ref 0.00–0.08)

## 2016-03-25 MED ORDER — ACETAMINOPHEN 500 MG PO TABS
1000.0000 mg | ORAL_TABLET | Freq: Once | ORAL | Status: AC
  Administered 2016-03-25: 1000 mg via ORAL
  Filled 2016-03-25: qty 2

## 2016-03-25 MED ORDER — KETOROLAC TROMETHAMINE 15 MG/ML IJ SOLN
15.0000 mg | Freq: Once | INTRAMUSCULAR | Status: DC
  Filled 2016-03-25: qty 1

## 2016-03-25 MED ORDER — GI COCKTAIL ~~LOC~~
30.0000 mL | Freq: Once | ORAL | Status: AC
  Administered 2016-03-25: 30 mL via ORAL
  Filled 2016-03-25: qty 30

## 2016-03-25 MED ORDER — PANTOPRAZOLE SODIUM 40 MG PO TBEC: 40.0000 mg | DELAYED_RELEASE_TABLET | Freq: Two times a day (BID) | ORAL | 0 refills | Status: DC

## 2016-03-25 NOTE — ED Notes (Signed)
Pt getting dressed.

## 2016-03-25 NOTE — ED Triage Notes (Signed)
C/o R sided chest pain, sob, and "spitting up" yellow phlegm since Thursday.  Denies cough.  States he was recently seen in ED for chest pain.

## 2016-03-25 NOTE — ED Provider Notes (Signed)
[facility] DEPT Provider Note   CSN: [id number] Arrival date & time: 03/25/16  0404     History   Chief Complaint Chief Complaint  Patient presents with  . Chest Pain    HPI Andre [name] is a 75 year old man with CAD, GERD, HTN  HPI  He presents with chest pain. Started 3 weeks ago. Right side of chest and feels like pressure. It does not radiate. Comes and goes. Comes mostly after meals about 30 minutes. Wakes him from his sleep. Associated with a little dyspnea. Not relieved by Gabriel Earing powders. Worsens with movement. No similar previous episodes. He coughs and it is productive of yellow phlegm which is new in the last three weeks. He is having headaches located in the frontal region. Relieved by Gabriel Earing powders. Feels sharp and sometimes like pressure.   Denies nausea/vomiting. No melena or hematochezia. He has lost 6-10lb over the last 6 months. No dysphagia or odynophagia. Tries to eat most of his foods baked or boiled. He does have fried eggs and hash browns for breakfast. Tries to quit eating around 5PM but sometimes eat ice cream around 7PM. Lies down at 10pm. He has the pain presently.  Sometimes he gets chest pain with walking several hundred feet. No dyspnea with walking. Lives by himself. No sick contacts  He was hospitalized on 03/19/2016 for chest pain. He was thought to have GERD. He has been taking Prilosec twice a day.  30-40% right coronary artery stenosis by cardiac catheterization in May 2017, no progression compared to 2013.  Past Medical History:  Diagnosis Date  . Arthritis   . Coronary artery disease, non-occlusive    a. 06/2011: cath for abnormal exercise echo: Only 30-40% proximal RCA. Otherwise normal. b. 10/2015: cath for abnormal NST and atypical CP --> showed mild 30-40% stenosis in the prox-distal RCA  . GERD (gastroesophageal reflux disease)   . HA (headache)   . Hypertension   . 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[place])   . TIA (transient ischemic attack)   . Weakness       Patient Active Problem List   Diagnosis Date Noted  . Chest pain 03/19/2016  . Back pain 02/29/2016  . Abnormal stress test 10/18/2015  . Chest pain with moderate risk for cardiac etiology 09/30/2015  . Erectile dysfunction 02/25/2015  . Cigarette smoker 02/10/2015  . COPD GOLD 0 11/08/2014  . Upper airway cough syndrome 08/10/2014  . Hemorrhoids 02/11/2014  . Constipation 05/30/2013  . 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 Headache 10/23/2011  . 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[place]) 10/23/2011  . GERD (gastroesophageal reflux disease) 10/23/2011  . Nicotine addiction 10/23/2011  . 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 Cervical stenosis of spinal canal 10/23/2011  . Essential hypertension, benign 07/10/2011    Past Surgical History:  Procedure Laterality Date  . CARDIAC CATHETERIZATION  January 2013   Proximal RCA 30-40%. Otherwise normal.  . CARDIAC CATHETERIZATION N/A 10/18/2015   Procedure: Left Heart Cath and Coronary Angiography;  Surgeon: Jettie Booze, MD;  Location: [facility];  Service: Cardiovascular;  Laterality: N/A;  . NM MYOVIEW LTD  March 2015   LOW RISK. No scar or ischemia. Normal EF (55%). No RWMA  . surgical excision of rectal cancer         Home Medications    Prior to Admission medications   Medication Sig Start Date End Date Taking? Authorizing Provider  aspirin EC 81 MG tablet Take 81 mg by mouth daily.    Historical Provider,  MD  butalbital-acetaminophen-caffeine (FIORICET, ESGIC) 50-325-40 MG tablet Take 1 tablet by mouth every 6 (six) hours as needed for headache. 02/25/16 02/24/17  Olivia Canter Sam, PA-C  nitroGLYCERIN (NITROSTAT) 0.4 MG SL tablet Place 1 tablet (0.4 mg total) under the tongue every 5 (five) minutes x 3 doses as needed for chest pain. 10/18/15   Erma Heritage, PA  nortriptyline (PAMELOR) 25 MG capsule Take 1 capsule (25 mg total) by mouth at bedtime. 02/29/16   Golden Circle, FNP  pantoprazole (PROTONIX) 40 MG tablet Take 1  tablet (40 mg total) by mouth 2 (two) times daily. 03/25/16   Milagros Loll, MD  valsartan-hydrochlorothiazide (DIOVAN HCT) 160-25 MG tablet Take 1 tablet by mouth daily. 02/08/16   Golden Circle, FNP    Family History Family History  Problem Relation Age of Onset  . Hypertension Mother   . Cancer Mother   . Liver disease Father     Social History Social History  Substance Use Topics  . Smoking status: Current Some Day Smoker    Packs/day: 0.30    Years: 47.00    Types: Cigarettes  . Smokeless tobacco: Never Used  . Alcohol use 1.2 oz/week    2 Cans of beer per week     Allergies   Review of patient's allergies indicates no known allergies.   Review of Systems Review of Systems Constitutional: no fevers/chills Eyes: no vision changes Ears, nose, mouth, throat, and face: +cough Respiratory: no shortness of breath Cardiovascular: see HPI Gastrointestinal: no nausea/vomiting, no abdominal pain, no constipation, no diarrhea Genitourinary: no dysuria, no hematuria Integument: no rash Hematologic/lymphatic: no bleeding/bruising, no edema Musculoskeletal: no arthralgias, no myalgias Neurological: no paresthesias, no weakness   Physical Exam Updated Vital Signs BP 159/95   Pulse 63   Temp 97.3 F (36.3 C) (Oral)   Resp 24   SpO2 97%   Physical Exam General Apperance: NAD Head: Normocephalic, atraumatic Eyes: PERRL, EOMI, anicteric sclera Ears: Normal external ear canal Nose: Nares normal, septum midline, mucosa normal Throat: Lips, mucosa and tongue normal  Neck: Supple, trachea midline Back: No tenderness or bony abnormality  Lungs: Clear to auscultation bilaterally. No wheezes, rhonchi or rales. Breathing comfortably Chest Wall: Nontender, no deformity Heart: Regular rate and rhythm, no murmur/rub/gallop Abdomen: Soft, nontender, nondistended, no rebound/guarding Extremities: Normal, atraumatic, warm and well perfused, no edema Pulses: 2+  throughout Skin: No rashes or lesions Neurologic: Alert and oriented x 3. CNII-XII intact. Normal strength and sensation   ED Treatments / Results  Labs (all labs ordered are listed, but only abnormal results are displayed) Labs Reviewed  BASIC METABOLIC PANEL - Abnormal; Notable for the following:       Result Value   Potassium 3.4 (*)    Chloride 97 (*)    Glucose, Bld 114 (*)    All other components within normal limits  HEPATIC FUNCTION PANEL - Abnormal; Notable for the following:    AST 52 (*)    All other components within normal limits  CBC  LIPASE, BLOOD  I-STAT TROPOININ, ED  I-STAT TROPOININ, ED    EKG  EKG Interpretation  Date/Time:  Saturday March 25 2016 04:09:42 EDT Ventricular Rate:  63 PR Interval:  198 QRS Duration: 98 QT Interval:  438 QTC Calculation: 448 R Axis:   -16 Text Interpretation:  Normal sinus rhythm with sinus arrhythmia Nonspecific ST and T wave abnormality Abnormal ECG No significant change was found Confirmed by Wyvonnia Dusky  MD, STEPHEN [phone])  on 03/25/2016 7:41:20 AM     Normal sinus rhythm, nonspecific T wave inversions in V2-3 that are unchanged from previous EKG.  Radiology Dg Chest 2 View  Result Date: 03/25/2016 CLINICAL DATA:  Right-sided chest pain.  Shortness of breath. EXAM: CHEST  2 VIEW COMPARISON:  Radiographs 6 days prior 03/15/2016.  CT 10/20/2015 FINDINGS: The cardiomediastinal contours are unchanged with stable cardiomegaly and tortuous thoracic atherosclerosis. The lungs are clear. Pulmonary vasculature is normal. No consolidation, pleural effusion, or pneumothorax. No acute osseous abnormalities are seen. IMPRESSION: Stable cardiomegaly.  No acute abnormality. Electronically Signed   By: Jeb Levering M.D.   On: 03/25/2016 05:20    Procedures   Medications Ordered in ED Medications  gi cocktail (Maalox,Lidocaine,Donnatal) (30 mLs Oral Given 03/25/16 0844)  acetaminophen (TYLENOL) tablet 1,000 mg (1,000 mg Oral  Given 03/25/16 0844)    Initial Impression / Assessment and Plan / ED Course  I have reviewed the triage vital signs and the nursing notes.  Pertinent labs & imaging results that were available during my care of the patient were reviewed by me and considered in my medical decision making (see chart for details).  Clinical Course   7:34am Evaluated. Ordered GI cocktail and Tylenol.  9:16am Symptoms improved  Final Clinical Impressions(s) / ED Diagnoses   Final diagnoses:  Nonspecific chest pain  Gastroesophageal reflux disease, esophagitis presence not specified  Acute non intractable tension-type headache   Pain is not reproducible on palpation making MSK etiology less likely. CXR with no acute cardiopulmonary process. Wells score 0 and Geneva score 0 making him low risk for PE. Initial troponin negative and EKG without acute ischemic changes. BMP and CBC unremarkable. Hepatic function panel, lipase unremarkable. Second troponin neg.  -Change omeprazole to pantoprazole -Tylenol prn headache and try to avoid Goody Powders -Follow up with PCP, GI as outpatient  New Prescriptions New Prescriptions   PANTOPRAZOLE (PROTONIX) 40 MG TABLET    Take 1 tablet (40 mg total) by mouth 2 (two) times daily.     Milagros Loll, MD 03/25/16 [place], MD 03/25/16 [phone]

## 2016-03-25 NOTE — Discharge Instructions (Signed)
You may take extra strength Tylenol for your headache Avoid taking Goody powders as these can make your reflux worse Take pantoprazole twice a day. Make sure you take it 30 minutes before a meal Follow up with your primary care provider. Make an appointment to see a GI specialist

## 2016-03-30 ENCOUNTER — Encounter: Payer: Self-pay | Admitting: Family

## 2016-03-30 ENCOUNTER — Ambulatory Visit (INDEPENDENT_AMBULATORY_CARE_PROVIDER_SITE_OTHER): Payer: Medicare Other | Admitting: Family

## 2016-03-30 ENCOUNTER — Other Ambulatory Visit (INDEPENDENT_AMBULATORY_CARE_PROVIDER_SITE_OTHER): Payer: Medicare Other

## 2016-03-30 VITALS — BP 124/80 | HR 73 | Temp 97.6°F | Resp 16 | Ht 71.0 in | Wt 199.8 lb

## 2016-03-30 DIAGNOSIS — R0789 Other chest pain: Secondary | ICD-10-CM | POA: Diagnosis not present

## 2016-03-30 LAB — H. PYLORI ANTIBODY, IGG: H Pylori IgG: NEGATIVE

## 2016-03-30 MED ORDER — SUCRALFATE 1 G PO TABS: 1.0000 g | ORAL_TABLET | Freq: Three times a day (TID) | ORAL | 0 refills | Status: DC

## 2016-03-30 NOTE — Patient Instructions (Signed)
Thank you for choosing [facility].  SUMMARY AND INSTRUCTIONS:  They will call to schedule your appointment with gastroenterology.  Avoid Goody's powder and aspirin.  Tylenol as needed for headaches.   Follow up after opthalmology.   Medication:  Your prescription(s) have been submitted to your pharmacy or been printed and provided for you. Please take as directed and contact our office if you believe you are having problem(s) with the medication(s) or have any questions.  Labs:  Please stop by the lab on the lower level of the building for your blood work. Your results will be released to [place] (or called to you) after review, usually within 72 hours after test completion. If any changes need to be made, you will be notified at that same time.  1.) The lab is open from 7:30am to 5:30 pm Monday-Friday 2.) No appointment is necessary 3.) Fasting (if needed) is 6-8 hours after food and drink; black coffee and water are okay   Follow up:  If your symptoms worsen or fail to improve, please contact our office for further instruction, or in case of emergency go directly to the emergency room at the closest medical facility.    Food Choices for Peptic Ulcer Disease When you have peptic ulcer disease, the foods you eat and your eating habits are very important. Choosing the right foods can help ease the discomfort of peptic ulcer disease. WHAT GENERAL GUIDELINES DO I NEED TO FOLLOW?  Choose fruits, vegetables, whole grains, and low-fat meat, fish, and poultry.   Keep a food diary to identify foods that cause symptoms.  Avoid foods that cause irritation or pain. These may be different for different people.  Eat frequent small meals instead of three large meals each day. The pain may be worse when your stomach is empty.  Avoid eating close to bedtime. WHAT FOODS ARE NOT RECOMMENDED? The following are some foods and drinks that may worsen your symptoms:  Black, white, and  red pepper.  Hot sauce.  Chili peppers.  Chili powder.  Chocolate and cocoa.   Alcohol.  Tea, coffee, and cola (regular and decaffeinated). The items listed above may not be a complete list of foods and beverages to avoid. Contact your dietitian for more information.   This information is not intended to replace advice given to you by your health care provider. Make sure you discuss any questions you have with your health care provider.   Document Released: 08/21/2011 Document Revised: 06/03/2013 Document Reviewed: 04/02/2013 Elsevier Interactive Patient Education 2016 Elsevier Inc.   Peptic Ulcer A peptic ulcer is a sore in the lining of your esophagus (esophageal ulcer), stomach (gastric ulcer), or in the first part of your small intestine (duodenal ulcer). The ulcer causes erosion into the deeper tissue. CAUSES  Normally, the lining of the stomach and the small intestine protects itself from the acid that digests food. The protective lining can be damaged by:  An infection caused by a bacterium called Helicobacter pylori (H. pylori).  Regular use of nonsteroidal anti-inflammatory drugs (NSAIDs), such as ibuprofen or aspirin.  Smoking tobacco. Other risk factors include being older than 50, drinking alcohol excessively, and having a family history of ulcer disease.  SYMPTOMS   Burning pain or gnawing in the area between the chest and the belly button.  Heartburn.  Nausea and vomiting.  Bloating. The pain can be worse on an empty stomach and at night. If the ulcer results in bleeding, it can cause:  Black, tarry  stools.  Vomiting of bright red blood.  Vomiting of coffee-ground-looking materials. DIAGNOSIS  A diagnosis is usually made based upon your history and an exam. Other tests and procedures may be performed to find the cause of the ulcer. Finding a cause will help determine the best treatment. Tests and procedures may include:  Blood tests, stool tests, or  breath tests to check for the bacterium H. pylori.  An upper gastrointestinal (GI) series of the esophagus, stomach, and small intestine.  An endoscopy to examine the esophagus, stomach, and small intestine.  A biopsy. TREATMENT  Treatment may include:  Eliminating the cause of the ulcer, such as smoking, NSAIDs, or alcohol.  Medicines to reduce the amount of acid in your digestive tract.  Antibiotic medicines if the ulcer is caused by the H. pylori bacterium.  An upper endoscopy to treat a bleeding ulcer.  Surgery if the bleeding is severe or if the ulcer created a hole somewhere in the digestive system. HOME CARE INSTRUCTIONS   Avoid tobacco, alcohol, and caffeine. Smoking can increase the acid in the stomach, and continued smoking will impair the healing of ulcers.  Avoid foods and drinks that seem to cause discomfort or aggravate your ulcer.  Only take medicines as directed by your caregiver. Do not substitute over-the-counter medicines for prescription medicines without talking to your caregiver.  Keep any follow-up appointments and tests as directed. SEEK MEDICAL CARE IF:   Your do not improve within 7 days of starting treatment.  You have ongoing indigestion or heartburn. SEEK IMMEDIATE MEDICAL CARE IF:   You have sudden, sharp, or persistent abdominal pain.  You have bloody or dark black, tarry stools.  You vomit blood or vomit that looks like coffee grounds.  You become light-headed, weak, or feel faint.  You become sweaty or clammy. MAKE SURE YOU:   Understand these instructions.  Will watch your condition.  Will get help right away if you are not doing well or get worse.   This information is not intended to replace advice given to you by your health care provider. Make sure you discuss any questions you have with your health care provider.   Document Released: 05/26/2000 Document Revised: 06/19/2014 Document Reviewed: 12/27/2011 Elsevier Interactive  Patient Education [employer].

## 2016-03-30 NOTE — Progress Notes (Signed)
Subjective:    Patient ID: Andre [name], male    DOB: 1941/03/17, 75 y.o.   MRN: [id number]  Chief Complaint  Patient presents with  . Hospitalization Follow-up    still having the chest pain that he did when he went to the hospital, having headaches too    HPI:  Andre [name] is a 75 y.o. male who  has a past medical history of Arthritis; Coronary artery disease, non-occlusive; GERD (gastroesophageal reflux disease); HA (headache); Hypertension; 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[place]); TIA (transient ischemic attack); and Weakness. and presents today for an acute office visit.   Nonspecific chest pain - Recently evaluated in the emergency department and admitted to the hospital with nonspecific chest pain. The pain was noted to be primarily at night for several weeks. Also describes a nightly cough. Pain was rated 8 out of 10. Pain remained improved with GI cocktail or Pepcid. Nitroglycerin provided s thome improvement. He was admitted for observations and a cardiology consult. Per cardiology notes this appears to be noncardiac chest pain and probable esophageal spasms which may also be improved by use of nitrates. He was noted to have abnormal ECG.He does continue to smoke and denies alcohol. All hospital records, imaging, and labs were reviewed in detail.  Since leaving the hospital and the ED he continues to experience right sided upper chest pain that is sharp at times. He is also experiencing a daily sharp headache that he described is getting worse. This is the same pain that he had when he went to the ED and hospital. Reports taking the protonix twice daily and no adverse side effects. Symptoms are slightly improved with the protonix. Chest pain occurs generally after he eats for about 30 minutes after he gets a chest pain that continues for a period of time. He has taken Rolaids which does help as well. Modifying factors for the headaches include Goody's powder or aspirin which takes a couple of times per day.  There has been a change in his vision and has difficulty driving at night. Denies any numbness, tingling, or weakness. Indicates that he has cut way back on tobacco and alcohol.   No Known Allergies    Outpatient Medications Prior to Visit  Medication Sig Dispense Refill  . aspirin EC 81 MG tablet Take 81 mg by mouth daily.    . butalbital-acetaminophen-caffeine (FIORICET, ESGIC) 50-325-40 MG tablet Take 1 tablet by mouth every 6 (six) hours as needed for headache. 20 tablet 0  . nitroGLYCERIN (NITROSTAT) 0.4 MG SL tablet Place 1 tablet (0.4 mg total) under the tongue every 5 (five) minutes x 3 doses as needed for chest pain. 25 tablet 3  . nortriptyline (PAMELOR) 25 MG capsule Take 1 capsule (25 mg total) by mouth at bedtime. 30 capsule 0  . pantoprazole (PROTONIX) 40 MG tablet Take 1 tablet (40 mg total) by mouth 2 (two) times daily. 60 tablet 0  . valsartan-hydrochlorothiazide (DIOVAN HCT) 160-25 MG tablet Take 1 tablet by mouth daily. 90 tablet 0   No facility-administered medications prior to visit.       Past Surgical History:  Procedure Laterality Date  . CARDIAC CATHETERIZATION  January 2013   Proximal RCA 30-40%. Otherwise normal.  . CARDIAC CATHETERIZATION N/A 10/18/2015   Procedure: Left Heart Cath and Coronary Angiography;  Surgeon: Jettie Booze, MD;  Location: [facility];  Service: Cardiovascular;  Laterality: N/A;  . NM MYOVIEW LTD  March 2015   LOW RISK. No scar or ischemia.  Normal EF (55%). No RWMA  . surgical excision of rectal cancer        Past Medical History:  Diagnosis Date  . Arthritis   . Coronary artery disease, non-occlusive    a. 06/2011: cath for abnormal exercise echo: Only 30-40% proximal RCA. Otherwise normal. b. 10/2015: cath for abnormal NST and atypical CP --> showed mild 30-40% stenosis in the prox-distal RCA  . GERD (gastroesophageal reflux disease)   . HA (headache)   . Hypertension   . 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[place])   . TIA (transient  ischemic attack)   . Weakness       Review of Systems  Constitutional: Negative for chills and fever.  Respiratory: Negative for chest tightness, shortness of breath and wheezing.   Cardiovascular: Positive for chest pain. Negative for palpitations and leg swelling.  Gastrointestinal: Negative for abdominal distention, abdominal pain, constipation, diarrhea, nausea and vomiting.  Neurological: Negative for seizures, speech difficulty, weakness and numbness.      Objective:    BP 124/80 (BP Location: Left Arm, Patient Position: Sitting, Cuff Size: Normal)   Pulse 73   Temp 97.6 F (36.4 C) (Oral)   Resp 16   Ht '5\' 11"'$  (1.803 m)   Wt 199 lb 12.8 oz (90.6 kg)   SpO2 97%   BMI 27.87 kg/m  Nursing note and vital signs reviewed.  Physical Exam  Constitutional: He is oriented to person, place, and time. He appears well-developed and well-nourished. No distress.  Cardiovascular: Normal rate, regular rhythm, normal heart sounds and intact distal pulses.   Pulmonary/Chest: Effort normal and breath sounds normal. He has no wheezes. He has no rales. He exhibits no tenderness.  Abdominal: Soft. Bowel sounds are normal. He exhibits no distension and no mass. There is no tenderness. There is no rebound and no guarding.  Neurological: He is alert and oriented to person, place, and time.  Skin: Skin is warm and dry.  Psychiatric: He has a normal mood and affect. His behavior is normal. Judgment and thought content normal.       Assessment & Plan:   Problem List Items Addressed This Visit      Other   Chest pain - Primary    Symptoms and exam are consistent with possible peptic ulcer disease and unlikely cardiovascular with most recent hospitalization showing no significant cardiac origin. Continue current dosage of pantoprazole. Obtain H. pylori. Start sucralfate. Recommend avoidance of nonsteroidal anti-inflammatories and aspirin. Food choices for peptic ulcer disease provided and after  visit summary. Refer to gastroenterology for possible endoscopy. Follow-up if symptoms worsen or do not improve.      Relevant Medications   sucralfate (CARAFATE) 1 g tablet   Other Relevant Orders   Ambulatory referral to Gastroenterology   H. pylori antibody, IgG    Other Visit Diagnoses   None.      I am having Mr. Trompeter start on sucralfate. I am also having him maintain his nitroGLYCERIN, aspirin EC, valsartan-hydrochlorothiazide, butalbital-acetaminophen-caffeine, nortriptyline, and pantoprazole.   Meds ordered this encounter  Medications  . sucralfate (CARAFATE) 1 g tablet    Sig: Take 1 tablet (1 g total) by mouth 4 (four) times daily -  with meals and at bedtime.    Dispense:  120 tablet    Refill:  0    Order Specific Question:   Supervising Provider    Answer:   Pricilla Holm A [[id number]]     Follow-up: Return in about 1 month (around 04/30/2016), or if  symptoms worsen or fail to improve.  Mauricio Po, FNP

## 2016-03-30 NOTE — Assessment & Plan Note (Signed)
Symptoms and exam are consistent with possible peptic ulcer disease and unlikely cardiovascular with most recent hospitalization showing no significant cardiac origin. Continue current dosage of pantoprazole. Obtain H. pylori. Start sucralfate. Recommend avoidance of nonsteroidal anti-inflammatories and aspirin. Food choices for peptic ulcer disease provided and after visit summary. Refer to gastroenterology for possible endoscopy. Follow-up if symptoms worsen or do not improve.

## 2016-03-31 DIAGNOSIS — H26492 Other secondary cataract, left eye: Secondary | ICD-10-CM | POA: Diagnosis not present

## 2016-03-31 DIAGNOSIS — H25811 Combined forms of age-related cataract, right eye: Secondary | ICD-10-CM | POA: Diagnosis not present

## 2016-03-31 DIAGNOSIS — H35371 Puckering of macula, right eye: Secondary | ICD-10-CM | POA: Diagnosis not present

## 2016-03-31 DIAGNOSIS — Z961 Presence of intraocular lens: Secondary | ICD-10-CM | POA: Diagnosis not present

## 2016-04-07 DIAGNOSIS — Z961 Presence of intraocular lens: Secondary | ICD-10-CM | POA: Diagnosis not present

## 2016-04-07 DIAGNOSIS — H26492 Other secondary cataract, left eye: Secondary | ICD-10-CM | POA: Diagnosis not present

## 2016-04-12 DIAGNOSIS — R079 Chest pain, unspecified: Secondary | ICD-10-CM | POA: Diagnosis not present

## 2016-04-12 DIAGNOSIS — K219 Gastro-esophageal reflux disease without esophagitis: Secondary | ICD-10-CM | POA: Diagnosis not present

## 2016-04-12 DIAGNOSIS — Z716 Tobacco abuse counseling: Secondary | ICD-10-CM | POA: Diagnosis not present

## 2016-04-13 DIAGNOSIS — R079 Chest pain, unspecified: Secondary | ICD-10-CM | POA: Diagnosis not present

## 2016-04-13 DIAGNOSIS — K5711 Diverticulosis of small intestine without perforation or abscess with bleeding: Secondary | ICD-10-CM | POA: Diagnosis not present

## 2016-04-13 DIAGNOSIS — K219 Gastro-esophageal reflux disease without esophagitis: Secondary | ICD-10-CM | POA: Diagnosis not present

## 2016-04-13 DIAGNOSIS — R12 Heartburn: Secondary | ICD-10-CM | POA: Diagnosis not present

## 2016-05-02 DIAGNOSIS — H2511 Age-related nuclear cataract, right eye: Secondary | ICD-10-CM | POA: Diagnosis not present

## 2016-05-05 IMAGING — CR DG CHEST 2V
2 series · 2 of 2 positions shown · non-contrast
Comparison: Chest radiograph performed 03/17/2015

CLINICAL DATA: Subacute onset of generalized chest pain and
shortness of breath. Initial encounter.

EXAM:
CHEST  2 VIEW

[chest pa]
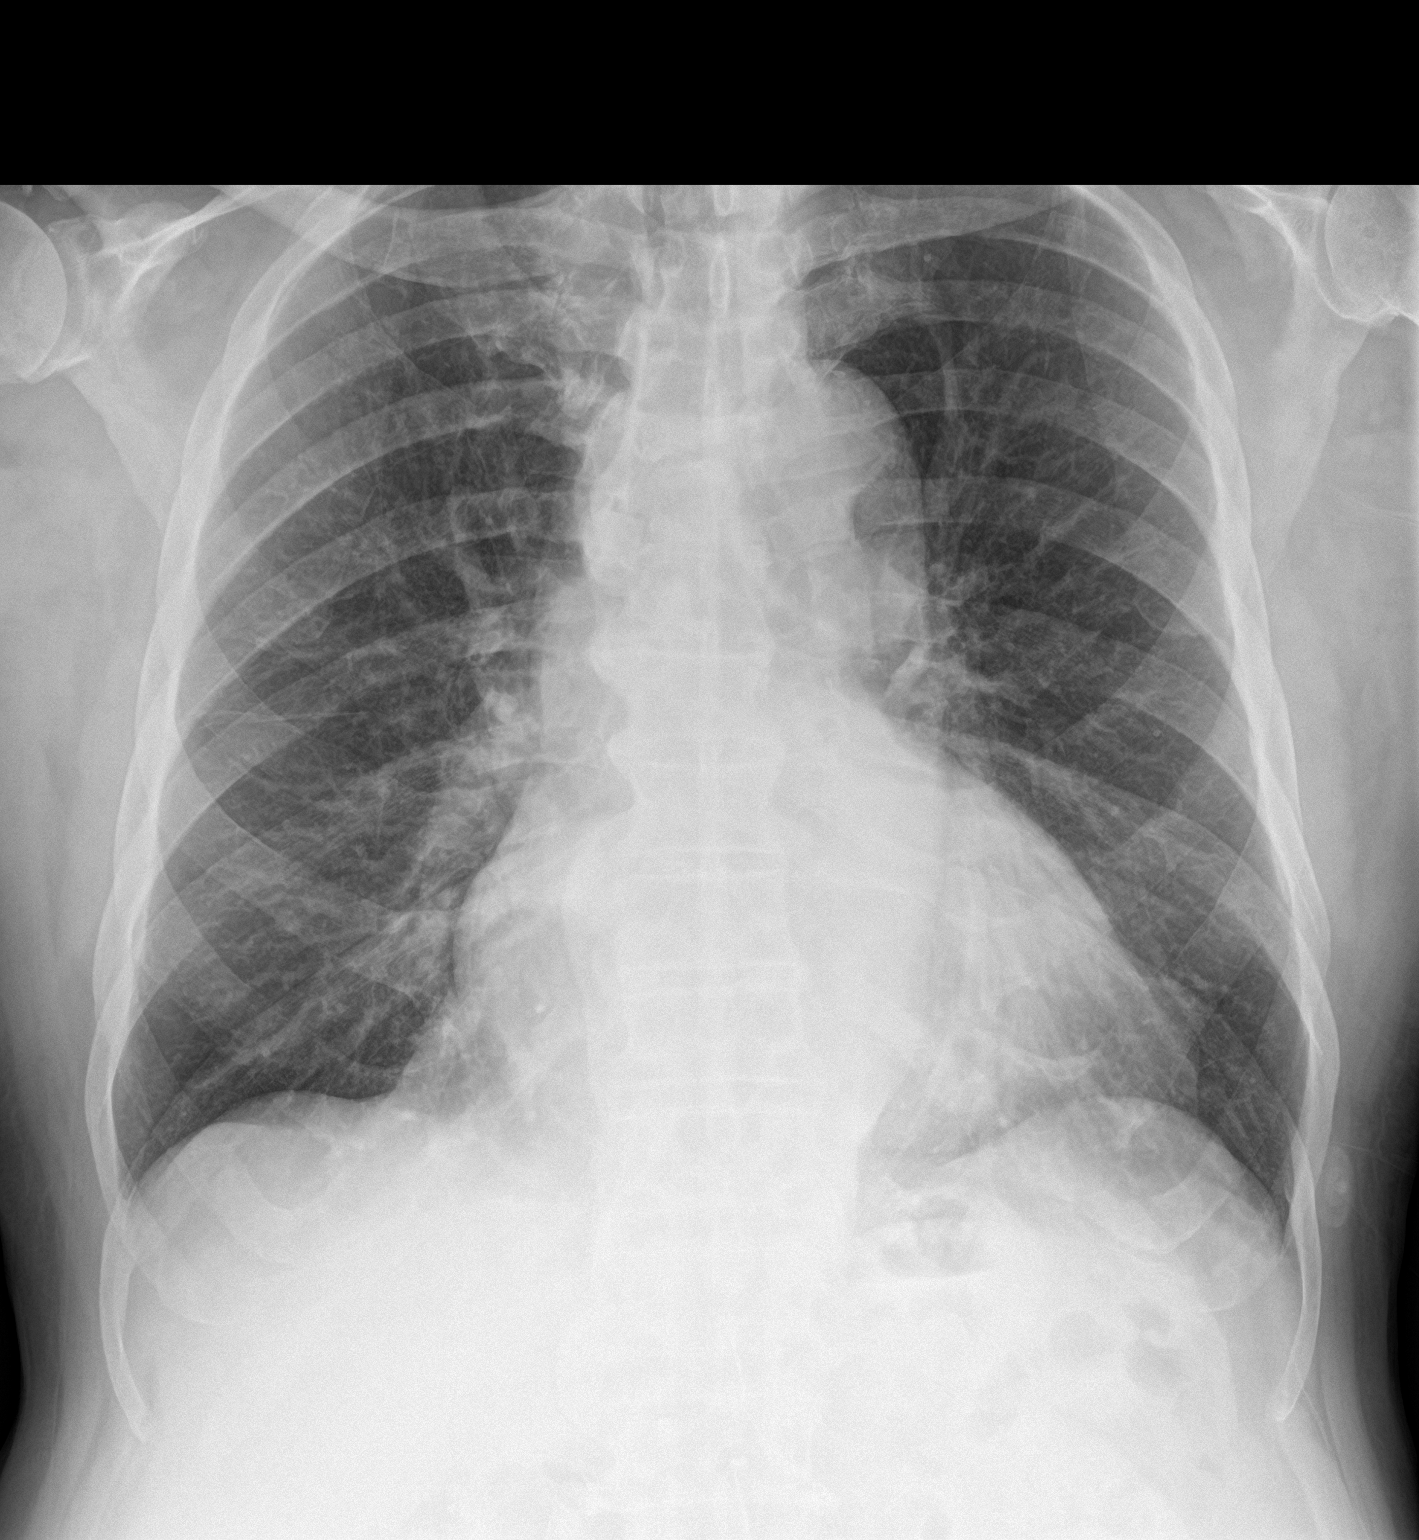

[chest lat]
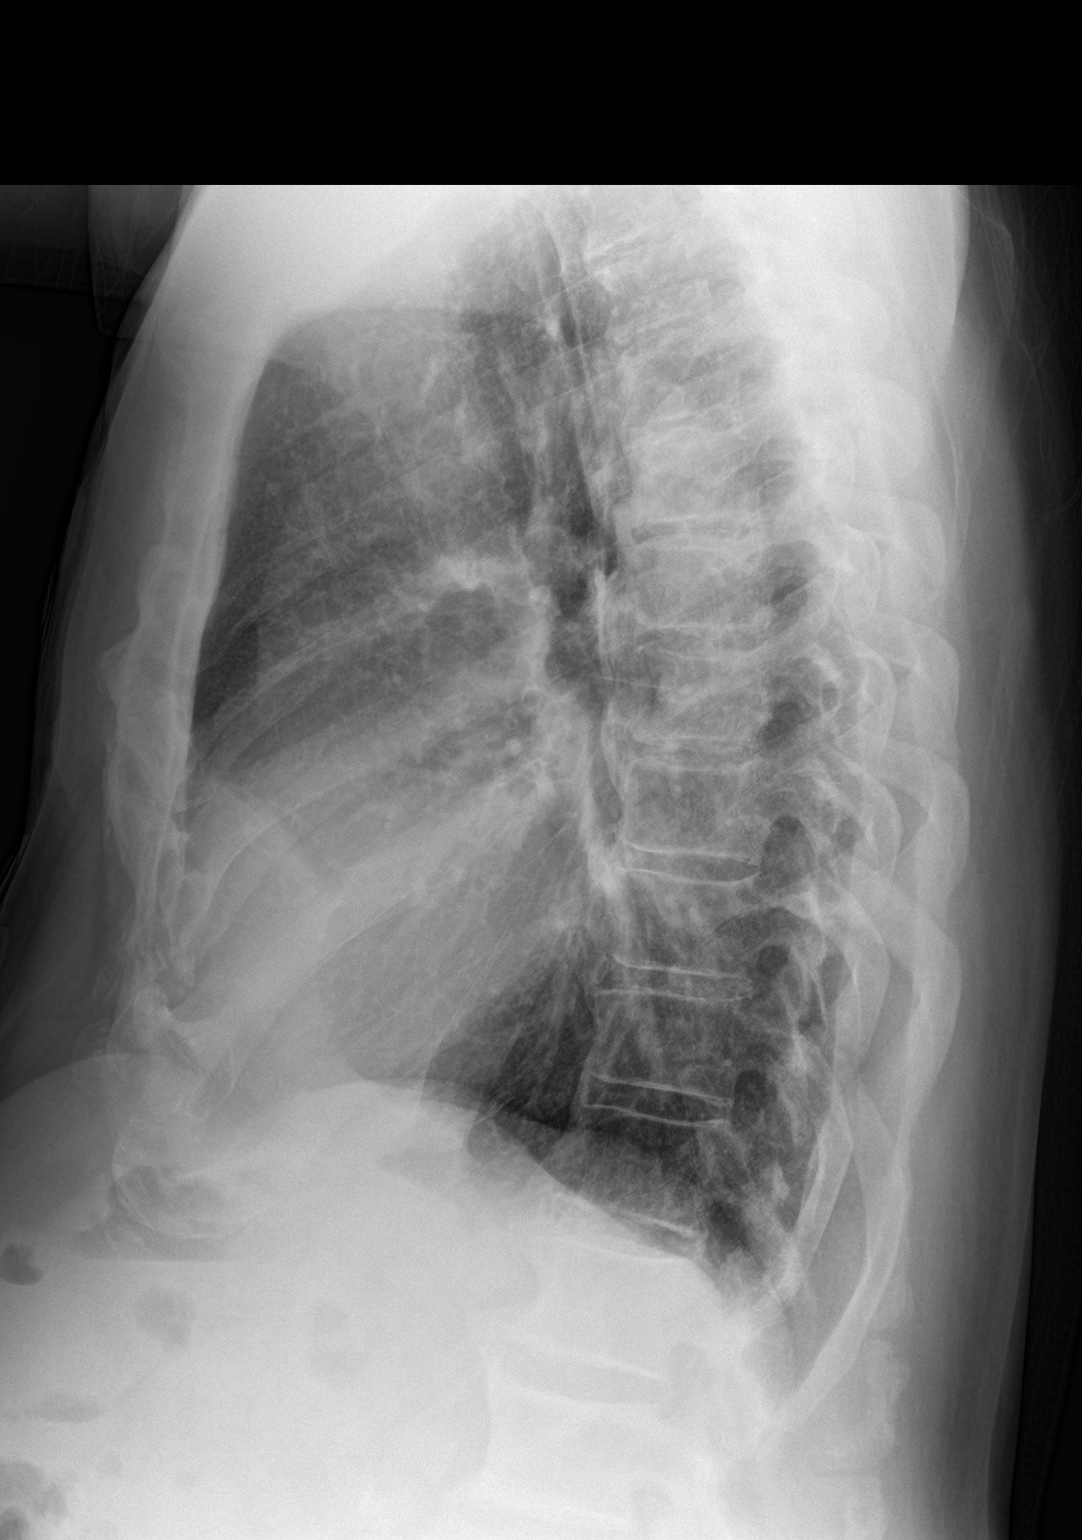

[2 of 2 positions shown; findings below may reference images not displayed]

FINDINGS: The lungs are well-aerated. Mild vascular congestion is noted. There
is no evidence of focal opacification, pleural effusion or
pneumothorax.

The heart is mildly enlarged. No acute osseous abnormalities are
seen. Anterior and lateral osteophytes are seen along the thoracic
spine.
IMPRESSION: Mild vascular congestion and mild cardiomegaly. Lungs remain grossly
clear.

## 2016-05-08 DIAGNOSIS — H25011 Cortical age-related cataract, right eye: Secondary | ICD-10-CM | POA: Diagnosis not present

## 2016-05-08 DIAGNOSIS — H25811 Combined forms of age-related cataract, right eye: Secondary | ICD-10-CM | POA: Diagnosis not present

## 2016-05-08 DIAGNOSIS — H2511 Age-related nuclear cataract, right eye: Secondary | ICD-10-CM | POA: Diagnosis not present

## 2016-05-24 ENCOUNTER — Other Ambulatory Visit: Payer: Self-pay | Admitting: Family

## 2016-05-24 DIAGNOSIS — I10 Essential (primary) hypertension: Secondary | ICD-10-CM

## 2016-06-20 ENCOUNTER — Ambulatory Visit (INDEPENDENT_AMBULATORY_CARE_PROVIDER_SITE_OTHER): Payer: Medicare Other | Admitting: Family

## 2016-06-20 ENCOUNTER — Encounter: Payer: Self-pay | Admitting: Family

## 2016-06-20 VITALS — BP 108/62 | HR 77 | Temp 98.3°F | Resp 18 | Ht 71.0 in | Wt 204.0 lb

## 2016-06-20 DIAGNOSIS — K2100 Gastro-esophageal reflux disease with esophagitis, without bleeding: Secondary | ICD-10-CM

## 2016-06-20 DIAGNOSIS — J069 Acute upper respiratory infection, unspecified: Secondary | ICD-10-CM

## 2016-06-20 MED ORDER — PANTOPRAZOLE SODIUM 40 MG PO TBEC: 40.0000 mg | DELAYED_RELEASE_TABLET | Freq: Every day | ORAL | 0 refills | Status: DC

## 2016-06-20 NOTE — Assessment & Plan Note (Signed)
Symptoms and exam consistent with acute upper respiratory infection most likely superimposed on exacerbation of gastroesophageal reflux. Treat conservatively with over-the-counter medications as needed for symptom relief and supportive care. Follow-up if symptoms worsen or do not improve.

## 2016-06-20 NOTE — Assessment & Plan Note (Signed)
Symptoms and exam are consistent with exacerbation of gastroesophageal reflux not currently maintained by sucralfate. Restart pantoprazole. Encouraged to increase sucralfate to 3 times daily. Information on GERD-related diet provided. Follow-up if symptoms worsen or do not improve.

## 2016-06-20 NOTE — Progress Notes (Signed)
Subjective:    Patient ID: Andre [name], male    DOB: 08/24/1940, 76 y.o.   MRN: [id number]  Chief Complaint  Patient presents with  . Cough    cough and congestion, having burning in his chest and headache x6 days    HPI:  Andre [name] is a 76 y.o. male who  has a past medical history of Arthritis; Coronary artery disease, non-occlusive; GERD (gastroesophageal reflux disease); HA (headache); Hypertension; 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[place]); TIA (transient ischemic attack); and Weakness. and presents today for an acute office visit.   This is a new problem. Associated symptoms of cough, congestion and feelings of chest burning has been going on for about 6 days. Denies fevers. Modifying factors include OTC cold/cough which helped to loosen things up. Overall course of the symptoms have worsened since initial onset. Severity is enough to keep hip up at night. Timing of symptoms is worse at night and aggravated by lying down. No recent antibiotics. Currently taking Sucralfate and not pantoprazole. Reports taking medication as prescribed with continued symptoms of burning at night.   No Known Allergies    Outpatient Medications Prior to Visit  Medication Sig Dispense Refill  . aspirin EC 81 MG tablet Take 81 mg by mouth daily.    . butalbital-acetaminophen-caffeine (FIORICET, ESGIC) 50-325-40 MG tablet Take 1 tablet by mouth every 6 (six) hours as needed for headache. 20 tablet 0  . nitroGLYCERIN (NITROSTAT) 0.4 MG SL tablet Place 1 tablet (0.4 mg total) under the tongue every 5 (five) minutes x 3 doses as needed for chest pain. 25 tablet 3  . nortriptyline (PAMELOR) 25 MG capsule Take 1 capsule (25 mg total) by mouth at bedtime. 30 capsule 0  . sucralfate (CARAFATE) 1 g tablet Take 1 tablet (1 g total) by mouth 4 (four) times daily -  with meals and at bedtime. 120 tablet 0  . valsartan-hydrochlorothiazide (DIOVAN-HCT) 160-25 MG tablet take 1 tablet by mouth once daily 90 tablet 0  . pantoprazole  (PROTONIX) 40 MG tablet Take 1 tablet (40 mg total) by mouth 2 (two) times daily. 60 tablet 0   No facility-administered medications prior to visit.       Past Surgical History:  Procedure Laterality Date  . CARDIAC CATHETERIZATION  January 2013   Proximal RCA 30-40%. Otherwise normal.  . CARDIAC CATHETERIZATION N/A 10/18/2015   Procedure: Left Heart Cath and Coronary Angiography;  Surgeon: Jettie Booze, MD;  Location: [facility];  Service: Cardiovascular;  Laterality: N/A;  . NM MYOVIEW LTD  March 2015   LOW RISK. No scar or ischemia. Normal EF (55%). No RWMA  . surgical excision of rectal cancer        Past Medical History:  Diagnosis Date  . Arthritis   . Coronary artery disease, non-occlusive    a. 06/2011: cath for abnormal exercise echo: Only 30-40% proximal RCA. Otherwise normal. b. 10/2015: cath for abnormal NST and atypical CP --> showed mild 30-40% stenosis in the prox-distal RCA  . GERD (gastroesophageal reflux disease)   . HA (headache)   . Hypertension   . 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[place])   . TIA (transient ischemic attack)   . Weakness       Review of Systems  Constitutional: Negative for chills and fever.  HENT: Positive for congestion.   Respiratory: Positive for cough.   Gastrointestinal: Negative for abdominal distention, abdominal pain, anal bleeding, blood in stool, constipation, diarrhea, nausea and vomiting.  Positive for heartburn.      Objective:    BP 108/62 (BP Location: Left Arm, Patient Position: Sitting, Cuff Size: Large)   Pulse 77   Temp 98.3 F (36.8 C) (Oral)   Resp 18   Ht '5\' 11"'$  (1.803 m)   Wt 204 lb (92.5 kg)   SpO2 97%   BMI 28.45 kg/m  Nursing note and vital signs reviewed.  Physical Exam  Constitutional: He is oriented to person, place, and time. He appears well-developed and well-nourished. No distress.  HENT:  Right Ear: Hearing, tympanic membrane, external ear and ear canal normal.  Left Ear: Hearing, tympanic  membrane, external ear and ear canal normal.  Nose: Right sinus exhibits no maxillary sinus tenderness and no frontal sinus tenderness. Left sinus exhibits no maxillary sinus tenderness and no frontal sinus tenderness.  Mouth/Throat: Uvula is midline, oropharynx is clear and moist and mucous membranes are normal.  Cardiovascular: Normal rate, regular rhythm, normal heart sounds and intact distal pulses.   Pulmonary/Chest: Effort normal and breath sounds normal.  Abdominal: Normal appearance and bowel sounds are normal. He exhibits no mass. There is no hepatosplenomegaly. There is no tenderness. There is no rigidity, no rebound, no guarding, no tenderness at McBurney's point and negative Murphy's sign.  Neurological: He is alert and oriented to person, place, and time.  Skin: Skin is warm and dry.  Psychiatric: He has a normal mood and affect. His behavior is normal. Judgment and thought content normal.       Assessment & Plan:   Problem List Items Addressed This Visit      Respiratory   Acute upper respiratory infection    Symptoms and exam consistent with acute upper respiratory infection most likely superimposed on exacerbation of gastroesophageal reflux. Treat conservatively with over-the-counter medications as needed for symptom relief and supportive care. Follow-up if symptoms worsen or do not improve.        Digestive   GERD (gastroesophageal reflux disease) - Primary    Symptoms and exam are consistent with exacerbation of gastroesophageal reflux not currently maintained by sucralfate. Restart pantoprazole. Encouraged to increase sucralfate to 3 times daily. Information on GERD-related diet provided. Follow-up if symptoms worsen or do not improve.      Relevant Medications   pantoprazole (PROTONIX) 40 MG tablet       I have changed [name]'s pantoprazole. I am also having him maintain his nitroGLYCERIN, aspirin EC, butalbital-acetaminophen-caffeine, nortriptyline, sucralfate,  and valsartan-hydrochlorothiazide.   Meds ordered this encounter  Medications  . pantoprazole (PROTONIX) 40 MG tablet    Sig: Take 1 tablet (40 mg total) by mouth daily.    Dispense:  60 tablet    Refill:  0    Order Specific Question:   Supervising Provider    Answer:   Pricilla Holm A [[id number]]     Follow-up: Return if symptoms worsen or fail to improve.  Mauricio Po, FNP

## 2016-06-20 NOTE — Patient Instructions (Signed)
Thank you for choosing [facility].  SUMMARY AND INSTRUCTIONS:  Medication:  Please take the sucralfate 3 times per day. May increase to 4 if needed.  Start taking pantoprazole/Protonix daily.  Your prescription(s) have been submitted to your pharmacy or been printed and provided for you. Please take as directed and contact our office if you believe you are having problem(s) with the medication(s) or have any questions.  Follow up:  If your symptoms worsen or fail to improve, please contact our office for further instruction, or in case of emergency go directly to the emergency room at the closest medical facility.    General Recommendations:    Please drink plenty of fluids.  Get plenty of rest   Sleep in humidified air  Use saline nasal sprays  Netti pot   OTC Medications:  Decongestants - helps relieve congestion   Flonase (generic fluticasone) or Nasacort (generic triamcinolone) - please make sure to use the "cross-over" technique at a 45 degree angle towards the opposite eye as opposed to straight up the nasal passageway.   Sudafed (generic pseudoephedrine - Note this is the one that is available behind the pharmacy counter); Products with phenylephrine (-PE) may also be used but is often not as effective as pseudoephedrine.   If you have HIGH BLOOD PRESSURE - Coricidin HBP; AVOID any product that is -D as this contains pseudoephedrine which may increase your blood pressure.  Afrin (oxymetazoline) every 6-8 hours for up to 3 days.   Allergies - helps relieve runny nose, itchy eyes and sneezing   Claritin (generic loratidine), Allegra (fexofenidine), or Zyrtec (generic cyrterizine) for runny nose. These medications should not cause drowsiness.  Note - Benadryl (generic diphenhydramine) may be used however may cause drowsiness  Cough -   Delsym or Robitussin (generic dextromethorphan)  Expectorants - helps loosen mucus to ease removal   Mucinex  (generic guaifenesin) as directed on the package.  Headaches / General Aches   Tylenol (generic acetaminophen) - DO NOT EXCEED 3 grams (3,000 mg) in a 24 hour time period  Advil/Motrin (generic ibuprofen)   Sore Throat -   Salt water gargle   Chloraseptic (generic benzocaine) spray or lozenges / Sucrets (generic dyclonine)     Food Choices for Gastroesophageal Reflux Disease, Adult When you have gastroesophageal reflux disease (GERD), the foods you eat and your eating habits are very important. Choosing the right foods can help ease your discomfort. What guidelines do I need to follow?  Choose fruits, vegetables, whole grains, and low-fat dairy products.  Choose low-fat meat, fish, and poultry.  Limit fats such as oils, salad dressings, butter, nuts, and avocado.  Keep a food diary. This helps you identify foods that cause symptoms.  Avoid foods that cause symptoms. These may be different for everyone.  Eat small meals often instead of 3 large meals a day.  Eat your meals slowly, in a place where you are relaxed.  Limit fried foods.  Cook foods using methods other than frying.  Avoid drinking alcohol.  Avoid drinking large amounts of liquids with your meals.  Avoid bending over or lying down until 2-3 hours after eating. What foods are not recommended? These are some foods and drinks that may make your symptoms worse: Vegetables  Tomatoes. Tomato juice. Tomato and spaghetti sauce. Chili peppers. Onion and garlic. Horseradish. Fruits  Oranges, grapefruit, and lemon (fruit and juice). Meats  High-fat meats, fish, and poultry. This includes hot dogs, ribs, ham, sausage, salami, and bacon. Dairy  Whole milk and chocolate milk. Sour cream. Cream. Butter. Ice cream. Cream cheese. Drinks  Coffee and tea. Bubbly (carbonated) drinks or energy drinks. Condiments  Hot sauce. Barbecue sauce. Sweets/Desserts  Chocolate and cocoa. Donuts. Peppermint and spearmint. Fats  and Oils  High-fat foods. This includes [place] fries and potato chips. Other  Vinegar. Strong spices. This includes black pepper, white pepper, red pepper, cayenne, curry powder, cloves, ginger, and chili powder. The items listed above may not be a complete list of foods and drinks to avoid. Contact your dietitian for more information.  This information is not intended to replace advice given to you by your health care provider. Make sure you discuss any questions you have with your health care provider. Document Released: 11/28/2011 Document Revised: 11/04/2015 Document Reviewed: 04/02/2013 Elsevier Interactive Patient Education  2017 [employer].

## 2016-06-23 ENCOUNTER — Emergency Department (HOSPITAL_COMMUNITY)
Admission: EM | Admit: 2016-06-23 | Discharge: 2016-06-23 | Disposition: A | Discharge status: Home / Self Care | Payer: Medicare Other | Attending: Emergency Medicine | Admitting: Emergency Medicine

## 2016-06-23 ENCOUNTER — Emergency Department (HOSPITAL_COMMUNITY): Payer: Medicare Other

## 2016-06-23 ENCOUNTER — Other Ambulatory Visit: Payer: Self-pay

## 2016-06-23 ENCOUNTER — Encounter (HOSPITAL_COMMUNITY): Payer: Self-pay

## 2016-06-23 DIAGNOSIS — R0989 Other specified symptoms and signs involving the circulatory and respiratory systems: Secondary | ICD-10-CM

## 2016-06-23 DIAGNOSIS — R079 Chest pain, unspecified: Secondary | ICD-10-CM | POA: Diagnosis not present

## 2016-06-23 DIAGNOSIS — Z7982 Long term (current) use of aspirin: Secondary | ICD-10-CM | POA: Diagnosis not present

## 2016-06-23 DIAGNOSIS — F17210 Nicotine dependence, cigarettes, uncomplicated: Secondary | ICD-10-CM | POA: Insufficient documentation

## 2016-06-23 DIAGNOSIS — J069 Acute upper respiratory infection, unspecified: Secondary | ICD-10-CM | POA: Diagnosis not present

## 2016-06-23 DIAGNOSIS — Z8673 Personal history of transient ischemic attack (TIA), and cerebral infarction without residual deficits: Secondary | ICD-10-CM | POA: Insufficient documentation

## 2016-06-23 DIAGNOSIS — Z85048 Personal history of other malignant neoplasm of rectum, rectosigmoid junction, and anus: Secondary | ICD-10-CM | POA: Insufficient documentation

## 2016-06-23 DIAGNOSIS — R05 Cough: Secondary | ICD-10-CM | POA: Diagnosis not present

## 2016-06-23 DIAGNOSIS — R519 Headache, unspecified: Secondary | ICD-10-CM

## 2016-06-23 DIAGNOSIS — I2510 Atherosclerotic heart disease of native coronary artery without angina pectoris: Secondary | ICD-10-CM | POA: Diagnosis not present

## 2016-06-23 DIAGNOSIS — R51 Headache: Secondary | ICD-10-CM | POA: Diagnosis not present

## 2016-06-23 LAB — BASIC METABOLIC PANEL
ANION GAP: 11 (ref 5–15)
BUN: 5 mg/dL — ABNORMAL LOW (ref 6–20)
CALCIUM: 9.2 mg/dL (ref 8.9–10.3)
CO2: 26 mmol/L (ref 22–32)
CREATININE: 0.77 mg/dL (ref 0.61–1.24)
Chloride: 100 mmol/L — ABNORMAL LOW (ref 101–111)
Glucose, Bld: 114 mg/dL — ABNORMAL HIGH (ref 65–99)
Potassium: 3.3 mmol/L — ABNORMAL LOW (ref 3.5–5.1)
Sodium: 137 mmol/L (ref 135–145)

## 2016-06-23 LAB — CBC
HCT: 44.6 % (ref 39.0–52.0)
HEMOGLOBIN: 15.1 g/dL (ref 13.0–17.0)
MCH: 31.9 pg (ref 26.0–34.0)
MCHC: 33.9 g/dL (ref 30.0–36.0)
MCV: 94.3 fL (ref 78.0–100.0)
PLATELETS: 250 10*3/uL (ref 150–400)
RBC: 4.73 MIL/uL (ref 4.22–5.81)
RDW: 15.2 % (ref 11.5–15.5)
WBC: 6.8 10*3/uL (ref 4.0–10.5)

## 2016-06-23 LAB — I-STAT TROPONIN, ED: TROPONIN I, POC: 0 ng/mL (ref 0.00–0.08)

## 2016-06-23 MED ORDER — ACETAMINOPHEN 325 MG PO TABS
650.0000 mg | ORAL_TABLET | Freq: Once | ORAL | Status: AC
  Administered 2016-06-23: 650 mg via ORAL

## 2016-06-23 MED ORDER — BENZONATATE 100 MG PO CAPS: 100.0000 mg | ORAL_CAPSULE | Freq: Three times a day (TID) | ORAL | 0 refills | Status: DC | PRN

## 2016-06-23 MED ORDER — POTASSIUM CHLORIDE CRYS ER 20 MEQ PO TBCR
40.0000 meq | EXTENDED_RELEASE_TABLET | Freq: Once | ORAL | Status: AC
  Administered 2016-06-23: 40 meq via ORAL
  Filled 2016-06-23: qty 2

## 2016-06-23 MED ORDER — ACETAMINOPHEN 325 MG PO TABS
ORAL_TABLET | ORAL | Status: AC
  Filled 2016-06-23: qty 1

## 2016-06-23 MED ORDER — GUAIFENESIN 100 MG/5ML PO LIQD: 100.0000 mg | ORAL | 0 refills | Status: DC | PRN

## 2016-06-23 NOTE — ED Provider Notes (Signed)
[facility] DEPT Provider Note   CSN: [id number] Arrival date & time: 06/23/16  0303   History   Chief Complaint Chief Complaint  Patient presents with  . Chest Pain  . Headache    HPI Andre [name] is a 76 y.o. male.  HPI  76 year old male presents with 2 weeks of chest and head congestion. Seems to be a little worse. The sputum is yellow from when coughing and running down the back of his throat. There is no shortness of breath. He states his chest feels congested but there is no pressure or pain. He has a chronic burning in his chest that is daily and not change for several months. No fevers during this time. His head feels congested but not really painful. Tylenol given in the waiting room seems to have helped. He saw his doctor couple days ago and was put on Protonix. Has not taken any other new medicines. Denies any green discharge. No hemoptysis. One time he had a little bit of blood in his nasal congestion out of the left nare. Symptoms are worse at night.  Procedures  10/18/15 Left Heart Cath and Coronary Angiography  Conclusion    Nonobstructive CAD.  Mildly elevated LVEDP.  LV was not injected due to tortuosity in the radial and subclavian.  If cath was needed in the future, would not use right radial approach.   Continue aggressive preventive therapy.       Past Medical History:  Diagnosis Date  . Arthritis   . Coronary artery disease, non-occlusive    a. 06/2011: cath for abnormal exercise echo: Only 30-40% proximal RCA. Otherwise normal. b. 10/2015: cath for abnormal NST and atypical CP --> showed mild 30-40% stenosis in the prox-distal RCA  . GERD (gastroesophageal reflux disease)   . HA (headache)   . Hypertension   . 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[place])   . TIA (transient ischemic attack)   . Weakness     Patient Active Problem List   Diagnosis Date Noted  . Acute upper respiratory infection 06/20/2016  . Chest pain 03/19/2016  . Back pain 02/29/2016  .  Abnormal stress test 10/18/2015  . Chest pain with moderate risk for cardiac etiology 09/30/2015  . Erectile dysfunction 02/25/2015  . Cigarette smoker 02/10/2015  . COPD GOLD 0 11/08/2014  . Upper airway cough syndrome 08/10/2014  . Hemorrhoids 02/11/2014  . Constipation 05/30/2013  . 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 Headache 10/23/2011  . 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[place]) 10/23/2011  . GERD (gastroesophageal reflux disease) 10/23/2011  . Nicotine addiction 10/23/2011  . 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 Cervical stenosis of spinal canal 10/23/2011  . Essential hypertension, benign 07/10/2011    Past Surgical History:  Procedure Laterality Date  . CARDIAC CATHETERIZATION  January 2013   Proximal RCA 30-40%. Otherwise normal.  . CARDIAC CATHETERIZATION N/A 10/18/2015   Procedure: Left Heart Cath and Coronary Angiography;  Surgeon: Jettie Booze, MD;  Location: [facility];  Service: Cardiovascular;  Laterality: N/A;  . NM MYOVIEW LTD  March 2015   LOW RISK. No scar or ischemia. Normal EF (55%). No RWMA  . surgical excision of rectal cancer         Home Medications    Prior to Admission medications   Medication Sig Start Date End Date Taking? Authorizing Provider  aspirin EC 81 MG tablet Take 81 mg by mouth daily.    Historical Provider, MD  benzonatate (TESSALON) 100 MG capsule Take 1 capsule (100  mg total) by mouth 3 (three) times daily as needed for cough. 06/23/16   Sherwood Gambler, MD  butalbital-acetaminophen-caffeine (FIORICET, ESGIC) [phone] MG tablet Take 1 tablet by mouth every 6 (six) hours as needed for headache. 02/25/16 02/24/17  Olivia Canter Sam, PA-C  guaiFENesin (ROBITUSSIN) 100 MG/5ML liquid Take 5-10 mLs (100-200 mg total) by mouth every 4 (four) hours as needed for cough or congestion. 06/23/16   Sherwood Gambler, MD  nitroGLYCERIN (NITROSTAT) 0.4 MG SL tablet Place 1 tablet (0.4 mg total) under the tongue every 5 (five) minutes x 3 doses as needed for chest  pain. 10/18/15   Erma Heritage, PA  nortriptyline (PAMELOR) 25 MG capsule Take 1 capsule (25 mg total) by mouth at bedtime. 02/29/16   Golden Circle, FNP  pantoprazole (PROTONIX) 40 MG tablet Take 1 tablet (40 mg total) by mouth daily. 06/20/16   Golden Circle, FNP  sucralfate (CARAFATE) 1 g tablet Take 1 tablet (1 g total) by mouth 4 (four) times daily -  with meals and at bedtime. 03/30/16   Golden Circle, FNP  valsartan-hydrochlorothiazide (DIOVAN-HCT) 160-25 MG tablet take 1 tablet by mouth once daily 05/25/16   Golden Circle, FNP    Family History Family History  Problem Relation Age of Onset  . Hypertension Mother   . Cancer Mother   . Liver disease Father     Social History Social History  Substance Use Topics  . Smoking status: Current Some Day Smoker    Packs/day: 0.30    Years: 47.00    Types: Cigarettes  . Smokeless tobacco: Never Used  . Alcohol use 1.2 oz/week    2 Cans of beer per week     Allergies   Patient has no known allergies.   Review of Systems Review of Systems  Constitutional: Negative for fever.  HENT: Positive for congestion.   Respiratory: Positive for cough. Negative for shortness of breath.   Cardiovascular: Positive for chest pain.  Musculoskeletal: Negative for neck pain and neck stiffness.  Neurological: Positive for headaches.  All other systems reviewed and are negative.    Physical Exam Updated Vital Signs BP 114/86   Pulse 65   Temp 98.1 F (36.7 C)   Resp 18   SpO2 94%   Physical Exam  Constitutional: He is oriented to person, place, and time. He appears well-developed and well-nourished.  HENT:  Head: Normocephalic and atraumatic.  Right Ear: External ear normal.  Left Ear: External ear normal.  Nose: Nose normal. No nasal septal hematoma. No epistaxis. Right sinus exhibits no maxillary sinus tenderness and no frontal sinus tenderness. Left sinus exhibits no maxillary sinus tenderness and no frontal sinus  tenderness.  Slight redness to anterior left nare c/w possible prior bleeding site  Eyes: Right eye exhibits no discharge. Left eye exhibits no discharge.  Neck: Normal range of motion. Neck supple.  Full active ROM of neck  Cardiovascular: Normal rate, regular rhythm and normal heart sounds.   Pulmonary/Chest: Effort normal and breath sounds normal. He has no wheezes.  Abdominal: Soft. There is no tenderness.  Musculoskeletal: He exhibits no edema.  Neurological: He is alert and oriented to person, place, and time.  CN 3-12 grossly intact. 5/5 strength in all 4 extremities. Grossly normal sensation. Normal finger to nose.   Skin: Skin is warm and dry.  Nursing note and vitals reviewed.    ED Treatments / Results  Labs (all labs ordered are listed, but only abnormal results  are displayed) Labs Reviewed  BASIC METABOLIC PANEL - Abnormal; Notable for the following:       Result Value   Potassium 3.3 (*)    Chloride 100 (*)    Glucose, Bld 114 (*)    BUN 5 (*)    All other components within normal limits  CBC  I-STAT TROPOININ, ED    EKG  EKG Interpretation  Date/Time:  Friday June 23 2016 03:13:53 EST Ventricular Rate:  73 PR Interval:  188 QRS Duration: 94 QT Interval:  408 QTC Calculation: 449 R Axis:   2 Text Interpretation:  Normal sinus rhythm Nonspecific ST and T wave abnormality Abnormal ECG ST changes unchanged from Oct 2017 Confirmed by Regenia Skeeter MD, Shamir Tuzzolino [phone]) on 06/23/2016 6:58:58 AM       Radiology Dg Chest 2 View  Result Date: 06/23/2016 CLINICAL DATA:  Chest pain for 2 weeks. Cough and shortness of breath. History of high blood pressure. EXAM: CHEST  2 VIEW COMPARISON:  03/25/2016 FINDINGS: Borderline heart size without vascular congestion. No focal airspace disease or consolidation in the lungs. Mild emphysematous changes are suggested. No blunting of costophrenic angles. No pneumothorax. Calcified and tortuous aorta. IMPRESSION: Mild cardiac  enlargement.  No evidence of active pulmonary disease. Electronically Signed   By: Lucienne Capers M.D.   On: 06/23/2016 03:41    Procedures Procedures (including critical care time)  Medications Ordered in ED Medications  acetaminophen (TYLENOL) 325 MG tablet (not administered)  potassium chloride SA (K-DUR,KLOR-CON) CR tablet 40 mEq (not administered)  acetaminophen (TYLENOL) tablet 650 mg (650 mg Oral Given 06/23/16 2035)     Initial Impression / Assessment and Plan / ED Course  I have reviewed the triage vital signs and the nursing notes.  Pertinent labs & imaging results that were available during my care of the patient were reviewed by me and considered in my medical decision making (see chart for details).  Clinical Course     Patient's presentation consistent with congestion that is probably viral in etiology. There is no sinus tenderness, fever, or green sputum. The blood he saw was likely from dried mucosa. No signs or symptoms of a bacterial etiology. No pneumonia on chest x-ray. Overall well appearing. Discussed symptomatic care such as guaifenesin, saline sprays, humidifier. Strict return precautions.  Final Clinical Impressions(s) / ED Diagnoses   Final diagnoses:  Viral upper respiratory tract infection  Chest congestion  Frontal headache    New Prescriptions New Prescriptions   BENZONATATE (TESSALON) 100 MG CAPSULE    Take 1 capsule (100 mg total) by mouth 3 (three) times daily as needed for cough.   GUAIFENESIN (ROBITUSSIN) 100 MG/5ML LIQUID    Take 5-10 mLs (100-200 mg total) by mouth every 4 (four) hours as needed for cough or congestion.     Sherwood Gambler, MD 06/23/16 [phone]

## 2016-06-23 NOTE — ED Notes (Signed)
Pt requesting headache meds. Triage RN notified.

## 2016-06-23 NOTE — Discharge Instructions (Signed)
Use nasal saline spray to help clear up your congestion Use a humidifier at night  Follow up with your primary doctor if not improving or if worsening Return or see your doctor if you develop fever or green congestion/sputum

## 2016-06-23 NOTE — ED Triage Notes (Signed)
Pt complaining of head and chest pain. Pt states recent cough and sinus pressure. Pt denies any radiation of chest pain. Pt denies any SOB.

## 2016-06-23 NOTE — ED Notes (Signed)
Patient presents to ed c/o nasal and chest congestion states it started 2 weeks ago. C/o productive cough yellow colored sputum at times.

## 2016-07-05 ENCOUNTER — Emergency Department (HOSPITAL_COMMUNITY): Payer: Medicare Other

## 2016-07-05 ENCOUNTER — Emergency Department (HOSPITAL_COMMUNITY)
Admission: EM | Admit: 2016-07-05 | Discharge: 2016-07-05 | Disposition: A | Discharge status: Home / Self Care | Payer: Medicare Other | Attending: Emergency Medicine | Admitting: Emergency Medicine

## 2016-07-05 ENCOUNTER — Encounter (HOSPITAL_COMMUNITY): Payer: Self-pay | Admitting: Emergency Medicine

## 2016-07-05 DIAGNOSIS — F17210 Nicotine dependence, cigarettes, uncomplicated: Secondary | ICD-10-CM | POA: Insufficient documentation

## 2016-07-05 DIAGNOSIS — Z7982 Long term (current) use of aspirin: Secondary | ICD-10-CM | POA: Diagnosis not present

## 2016-07-05 DIAGNOSIS — J209 Acute bronchitis, unspecified: Secondary | ICD-10-CM | POA: Insufficient documentation

## 2016-07-05 DIAGNOSIS — I2510 Atherosclerotic heart disease of native coronary artery without angina pectoris: Secondary | ICD-10-CM | POA: Insufficient documentation

## 2016-07-05 DIAGNOSIS — Z85048 Personal history of other malignant neoplasm of rectum, rectosigmoid junction, and anus: Secondary | ICD-10-CM | POA: Diagnosis not present

## 2016-07-05 DIAGNOSIS — Z8673 Personal history of transient ischemic attack (TIA), and cerebral infarction without residual deficits: Secondary | ICD-10-CM | POA: Insufficient documentation

## 2016-07-05 DIAGNOSIS — I10 Essential (primary) hypertension: Secondary | ICD-10-CM | POA: Insufficient documentation

## 2016-07-05 DIAGNOSIS — Z79899 Other long term (current) drug therapy: Secondary | ICD-10-CM | POA: Diagnosis not present

## 2016-07-05 DIAGNOSIS — R05 Cough: Secondary | ICD-10-CM | POA: Diagnosis not present

## 2016-07-05 LAB — CBC WITH DIFFERENTIAL/PLATELET
BASOS ABS: 0 10*3/uL (ref 0.0–0.1)
BASOS PCT: 1 %
Eosinophils Absolute: 0.2 10*3/uL (ref 0.0–0.7)
Eosinophils Relative: 3 %
HEMATOCRIT: 45.3 % (ref 39.0–52.0)
Hemoglobin: 15.3 g/dL (ref 13.0–17.0)
LYMPHS PCT: 56 %
Lymphs Abs: 3.9 10*3/uL (ref 0.7–4.0)
MCH: 32 pg (ref 26.0–34.0)
MCHC: 33.8 g/dL (ref 30.0–36.0)
MCV: 94.8 fL (ref 78.0–100.0)
MONO ABS: 0.6 10*3/uL (ref 0.1–1.0)
Monocytes Relative: 9 %
NEUTROS ABS: 2.1 10*3/uL (ref 1.7–7.7)
Neutrophils Relative %: 31 %
PLATELETS: 232 10*3/uL (ref 150–400)
RBC: 4.78 MIL/uL (ref 4.22–5.81)
RDW: 15 % (ref 11.5–15.5)
WBC: 6.9 10*3/uL (ref 4.0–10.5)

## 2016-07-05 LAB — COMPREHENSIVE METABOLIC PANEL
ALBUMIN: 3.8 g/dL (ref 3.5–5.0)
ALT: 30 U/L (ref 17–63)
AST: 44 U/L — AB (ref 15–41)
Alkaline Phosphatase: 92 U/L (ref 38–126)
Anion gap: 15 (ref 5–15)
BILIRUBIN TOTAL: 0.9 mg/dL (ref 0.3–1.2)
BUN: 8 mg/dL (ref 6–20)
CHLORIDE: 96 mmol/L — AB (ref 101–111)
CO2: 25 mmol/L (ref 22–32)
CREATININE: 0.9 mg/dL (ref 0.61–1.24)
Calcium: 9.4 mg/dL (ref 8.9–10.3)
GFR calc Af Amer: >60 mL/min (ref >60)
GLUCOSE: 138 mg/dL — AB (ref 65–99)
POTASSIUM: 3.3 mmol/L — AB (ref 3.5–5.1)
Sodium: 136 mmol/L (ref 135–145)
Total Protein: 6.9 g/dL (ref 6.5–8.1)

## 2016-07-05 LAB — I-STAT TROPONIN, ED: Troponin i, poc: 0 ng/mL (ref 0.00–0.08)

## 2016-07-05 MED ORDER — IPRATROPIUM-ALBUTEROL 0.5-2.5 (3) MG/3ML IN SOLN
3.0000 mL | Freq: Once | RESPIRATORY_TRACT | Status: AC
  Administered 2016-07-05: 3 mL via RESPIRATORY_TRACT
  Filled 2016-07-05: qty 3

## 2016-07-05 MED ORDER — ACETAMINOPHEN 325 MG PO TABS
650.0000 mg | ORAL_TABLET | Freq: Once | ORAL | Status: AC
  Administered 2016-07-05: 650 mg via ORAL
  Filled 2016-07-05: qty 2

## 2016-07-05 MED ORDER — AMOXICILLIN 500 MG PO CAPS
1000.0000 mg | ORAL_CAPSULE | Freq: Once | ORAL | Status: AC
  Administered 2016-07-05: 1000 mg via ORAL
  Filled 2016-07-05: qty 2

## 2016-07-05 MED ORDER — AMOXICILLIN 500 MG PO CAPS: 1000.0000 mg | ORAL_CAPSULE | Freq: Two times a day (BID) | ORAL | 0 refills | Status: DC

## 2016-07-05 NOTE — ED Provider Notes (Signed)
[facility] DEPT Provider Note   CSN: [id number] Arrival date & time: 07/05/16  0010     History   Chief Complaint Chief Complaint  Patient presents with  . Cough    Chest Congestion     HPI Andre [name] is a 76 y.o. male.  He has had cough and nasal congestion for the last 3 weeks. Cough is productive of copious amount of yellowish sputum and he has had some yellowish nasal drainage. He has had some low-grade fevers as well as some chills and sweats. He denies arthralgias or myalgias. He had been seen in the ED about 2 weeks ago and states that he is not getting any better. In fact, symptoms of gotten worse over the last 2 days. He is complaining of a mild headache. There is no nausea or vomiting or diarrhea. He did receive the influenza immunization this season.   The history is provided by the patient.  Cough     Past Medical History:  Diagnosis Date  . Arthritis   . Coronary artery disease, non-occlusive    a. 06/2011: cath for abnormal exercise echo: Only 30-40% proximal RCA. Otherwise normal. b. 10/2015: cath for abnormal NST and atypical CP --> showed mild 30-40% stenosis in the prox-distal RCA  . GERD (gastroesophageal reflux disease)   . HA (headache)   . Hypertension   . 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[place])   . TIA (transient ischemic attack)   . Weakness     Patient Active Problem List   Diagnosis Date Noted  . Acute upper respiratory infection 06/20/2016  . Chest pain 03/19/2016  . Back pain 02/29/2016  . Abnormal stress test 10/18/2015  . Chest pain with moderate risk for cardiac etiology 09/30/2015  . Erectile dysfunction 02/25/2015  . Cigarette smoker 02/10/2015  . COPD GOLD 0 11/08/2014  . Upper airway cough syndrome 08/10/2014  . Hemorrhoids 02/11/2014  . Constipation 05/30/2013  . 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 Headache 10/23/2011  . 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[place]) 10/23/2011  . GERD (gastroesophageal reflux disease) 10/23/2011  . Nicotine addiction 10/23/2011    . 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 Cervical stenosis of spinal canal 10/23/2011  . Essential hypertension, benign 07/10/2011    Past Surgical History:  Procedure Laterality Date  . CARDIAC CATHETERIZATION  January 2013   Proximal RCA 30-40%. Otherwise normal.  . CARDIAC CATHETERIZATION N/A 10/18/2015   Procedure: Left Heart Cath and Coronary Angiography;  Surgeon: Jettie Booze, MD;  Location: [facility];  Service: Cardiovascular;  Laterality: N/A;  . NM MYOVIEW LTD  March 2015   LOW RISK. No scar or ischemia. Normal EF (55%). No RWMA  . surgical excision of rectal cancer         Home Medications    Prior to Admission medications   Medication Sig Start Date End Date Taking? Authorizing Provider  aspirin EC 81 MG tablet Take 81 mg by mouth daily.    Historical Provider, MD  benzonatate (TESSALON) 100 MG capsule Take 1 capsule (100 mg total) by mouth 3 (three) times daily as needed for cough. 06/23/16   Sherwood Gambler, MD  butalbital-acetaminophen-caffeine (FIORICET, ESGIC) [phone] MG tablet Take 1 tablet by mouth every 6 (six) hours as needed for headache. 02/25/16 02/24/17  Olivia Canter Sam, PA-C  guaiFENesin (ROBITUSSIN) 100 MG/5ML liquid Take 5-10 mLs (100-200 mg total) by mouth every 4 (four) hours as needed for cough or congestion. 06/23/16   Sherwood Gambler, MD  nitroGLYCERIN (NITROSTAT) 0.4 MG  SL tablet Place 1 tablet (0.4 mg total) under the tongue every 5 (five) minutes x 3 doses as needed for chest pain. 10/18/15   Erma Heritage, PA  nortriptyline (PAMELOR) 25 MG capsule Take 1 capsule (25 mg total) by mouth at bedtime. 02/29/16   Golden Circle, FNP  pantoprazole (PROTONIX) 40 MG tablet Take 1 tablet (40 mg total) by mouth daily. 06/20/16   Golden Circle, FNP  sucralfate (CARAFATE) 1 g tablet Take 1 tablet (1 g total) by mouth 4 (four) times daily -  with meals and at bedtime. 03/30/16   Golden Circle, FNP  valsartan-hydrochlorothiazide (DIOVAN-HCT)  160-25 MG tablet take 1 tablet by mouth once daily 05/25/16   Golden Circle, FNP    Family History Family History  Problem Relation Age of Onset  . Hypertension Mother   . Cancer Mother   . Liver disease Father     Social History Social History  Substance Use Topics  . Smoking status: Current Some Day Smoker    Packs/day: 0.30    Years: 47.00    Types: Cigarettes  . Smokeless tobacco: Never Used  . Alcohol use 1.2 oz/week    2 Cans of beer per week     Allergies   Patient has no known allergies.   Review of Systems Review of Systems  Respiratory: Positive for cough.   All other systems reviewed and are negative.    Physical Exam Updated Vital Signs BP 132/72   Pulse 60   Resp 13   SpO2 98%   Physical Exam  Nursing note and vitals reviewed.  76 year old male, resting comfortably and in no acute distress. Vital signs are Normal. Oxygen saturation is 98%, which is normal. Head is normocephalic and atraumatic. PERRLA, EOMI. Oropharynx is clear. There is mild tenderness to palpation over the right maxillary sinus. Neck is nontender and supple without adenopathy or JVD. Back is nontender and there is no CVA tenderness. Lungs have scattered wheezes with scattered bibasilar rales. There are no rhonchi. Chest is nontender. Heart has regular rate and rhythm without murmur. Abdomen is soft, flat, nontender without masses or hepatosplenomegaly and peristalsis is normoactive. Extremities have no cyanosis or edema, full range of motion is present. Skin is warm and dry without rash. Neurologic: Mental status is normal, cranial nerves are intact, there are no motor or sensory deficits.  ED Treatments / Results  Labs (all labs ordered are listed, but only abnormal results are displayed) Labs Reviewed  COMPREHENSIVE METABOLIC PANEL - Abnormal; Notable for the following:       Result Value   Potassium 3.3 (*)    Chloride 96 (*)    Glucose, Bld 138 (*)    AST 44 (*)      All other components within normal limits  CBC WITH DIFFERENTIAL/PLATELET  Randolm Idol, ED    EKG  EKG Interpretation  Date/Time:  Wednesday July 05 2016 00:17:03 EST Ventricular Rate:  67 PR Interval:  200 QRS Duration: 98 QT Interval:  422 QTC Calculation: 445 R Axis:   -4 Text Interpretation:  Sinus rhythm with Premature atrial complexes ST & T wave abnormality, consider anterior ischemia Abnormal ECG When compared with ECG of 06/23/2016, No significant change was found Confirmed by [facility]  MD, Lupe Handley ([id number]) on 07/05/2016 12:44:52 AM       Radiology Dg Chest 2 View  Result Date: 07/05/2016 CLINICAL DATA:  76 year old male with cough and chest congestion. EXAM: CHEST  2 VIEW COMPARISON:  Chest radiograph dated 06/23/2016 and CT dated 10/20/2015 FINDINGS: The lungs are clear. There is no pleural effusion or pneumothorax. There is mild cardiomegaly similar to prior radiograph. No acute osseous pathology. Sclerotic changes of the humeral heads may be degenerative or related to avascular necrosis. IMPRESSION: No active cardiopulmonary disease. Electronically Signed   By: Anner Crete M.D.   On: 07/05/2016 01:17    Procedures Procedures (including critical care time)  Medications Ordered in ED Medications  ipratropium-albuterol (DUONEB) 0.5-2.5 (3) MG/3ML nebulizer solution 3 mL (not administered)  amoxicillin (AMOXIL) capsule 1,000 mg (not administered)     Initial Impression / Assessment and Plan / ED Course  I have reviewed the triage vital signs and the nursing notes.  Pertinent labs & imaging results that were available during my care of the patient were reviewed by me and considered in my medical decision making (see chart for details).  Respiratory tract infection which probably was initially viral but suspicious for bacterial superinfection. Chest x-ray shows no evidence of pneumonia. CBC actually has a right shift suggestive of ongoing viral infection. Old  records are reviewed confirming recent ED visit for respiratory tract infection. Given his wheezing, he will be given albuterol with ipratropium and reassessed. Given worsening of symptoms, will treat with antibiotics and is started on amoxicillin.  He had no improvement with albuterol and ipratropium. He is discharged with prescription for amoxicillin. Follow-up with PCP.  Final Clinical Impressions(s) / ED Diagnoses   Final diagnoses:  Acute bronchitis, unspecified organism    New Prescriptions New Prescriptions   AMOXICILLIN (AMOXIL) 500 MG CAPSULE    Take 2 capsules (1,000 mg total) by mouth 2 (two) times daily.     Delora Fuel, MD 29/56/21 [id number]

## 2016-07-05 NOTE — ED Notes (Signed)
Pt verbalized understanding of d/c instructions and has no further questions. Pt is stable, A&Ox4, VSS.  

## 2016-07-05 NOTE — Discharge Instructions (Signed)
Drink plenty of fluids. Take acetaminophen or ibuprofen as needed for fever or aching.

## 2016-07-05 NOTE — ED Triage Notes (Signed)
Patient reports persistent productive cough with chest congestion/ chest tightness for 3 weeks , denies fever or chills .

## 2016-07-10 ENCOUNTER — Emergency Department (HOSPITAL_COMMUNITY): Payer: Medicare Other

## 2016-07-10 ENCOUNTER — Encounter (HOSPITAL_COMMUNITY): Payer: Self-pay | Admitting: Neurology

## 2016-07-10 ENCOUNTER — Emergency Department (HOSPITAL_COMMUNITY)
Admission: EM | Admit: 2016-07-10 | Discharge: 2016-07-10 | Disposition: A | Discharge status: Home / Self Care | Payer: Medicare Other | Attending: Emergency Medicine | Admitting: Emergency Medicine

## 2016-07-10 DIAGNOSIS — Z85048 Personal history of other malignant neoplasm of rectum, rectosigmoid junction, and anus: Secondary | ICD-10-CM | POA: Diagnosis not present

## 2016-07-10 DIAGNOSIS — I2510 Atherosclerotic heart disease of native coronary artery without angina pectoris: Secondary | ICD-10-CM | POA: Insufficient documentation

## 2016-07-10 DIAGNOSIS — Z8673 Personal history of transient ischemic attack (TIA), and cerebral infarction without residual deficits: Secondary | ICD-10-CM | POA: Insufficient documentation

## 2016-07-10 DIAGNOSIS — Z79899 Other long term (current) drug therapy: Secondary | ICD-10-CM | POA: Diagnosis not present

## 2016-07-10 DIAGNOSIS — R51 Headache: Secondary | ICD-10-CM | POA: Insufficient documentation

## 2016-07-10 DIAGNOSIS — I10 Essential (primary) hypertension: Secondary | ICD-10-CM | POA: Insufficient documentation

## 2016-07-10 DIAGNOSIS — Z7982 Long term (current) use of aspirin: Secondary | ICD-10-CM | POA: Insufficient documentation

## 2016-07-10 DIAGNOSIS — F17210 Nicotine dependence, cigarettes, uncomplicated: Secondary | ICD-10-CM | POA: Diagnosis not present

## 2016-07-10 DIAGNOSIS — J449 Chronic obstructive pulmonary disease, unspecified: Secondary | ICD-10-CM | POA: Diagnosis not present

## 2016-07-10 DIAGNOSIS — R519 Headache, unspecified: Secondary | ICD-10-CM

## 2016-07-10 MED ORDER — PROCHLORPERAZINE EDISYLATE 5 MG/ML IJ SOLN
5.0000 mg | Freq: Once | INTRAMUSCULAR | Status: AC
  Administered 2016-07-10: 5 mg via INTRAVENOUS
  Filled 2016-07-10: qty 2

## 2016-07-10 MED ORDER — KETOROLAC TROMETHAMINE 15 MG/ML IJ SOLN
15.0000 mg | Freq: Once | INTRAMUSCULAR | Status: AC
  Administered 2016-07-10: 15 mg via INTRAVENOUS
  Filled 2016-07-10: qty 1

## 2016-07-10 MED ORDER — SODIUM CHLORIDE 0.9 % IV BOLUS (SEPSIS)
500.0000 mL | Freq: Once | INTRAVENOUS | Status: AC
  Administered 2016-07-10: 500 mL via INTRAVENOUS

## 2016-07-10 MED ORDER — DIPHENHYDRAMINE HCL 50 MG/ML IJ SOLN
12.5000 mg | Freq: Once | INTRAMUSCULAR | Status: AC
  Administered 2016-07-10: 12.5 mg via INTRAVENOUS
  Filled 2016-07-10: qty 1

## 2016-07-10 MED ORDER — OXYCODONE-ACETAMINOPHEN 5-325 MG PO TABS
1.0000 | ORAL_TABLET | ORAL | Status: DC | PRN
  Administered 2016-07-10: 1 via ORAL
  Filled 2016-07-10: qty 1

## 2016-07-10 MED ORDER — OXYCODONE-ACETAMINOPHEN 5-325 MG PO TABS
ORAL_TABLET | ORAL | Status: AC
  Filled 2016-07-10: qty 1

## 2016-07-10 NOTE — ED Notes (Signed)
Care handoff to [facility]. RN

## 2016-07-10 NOTE — ED Notes (Addendum)
Pt not driving home with pain meds given. Requesting something to help his h/a while he is waiting.

## 2016-07-10 NOTE — ED Triage Notes (Signed)
Pt reports migraine for 20 hours, has taken tylenol without relief. Has hx of migraines. Denies n/v. Pt is a x 4. Pt drove here, ambulatory.

## 2016-07-10 NOTE — ED Provider Notes (Signed)
Andre [name] Provider Note   CSN: [id number] Arrival date & time: 07/10/16  1013  By signing my name below, I, Andre [name], attest that this documentation has been prepared under the direction and in the presence of Andre Manifold, MD . Electronically Signed: [name], Scribe. 07/10/2016. 2:12 PM.  History   Chief Complaint Chief Complaint  Patient presents with  . Migraine    The history is provided by the patient. No language interpreter was used.     HPI Comments:  Andre [name] is a 76 y.o. male with a history of HTN and TIA, who presents to the Emergency Department complaining of intermittent HA x 4 days with pain to the frontal region. His pain has worsened in the last 24 hours. No alleviating factors noted. He notes associated mild blurry vision. Pt denies numbness/weakness, fever, neck pain, ear pain, and photophobia. He also denies use of anti-coagulants.    Past Medical History:  Diagnosis Date  . Arthritis   . Coronary artery disease, non-occlusive    a. 06/2011: cath for abnormal exercise echo: Only 30-40% proximal RCA. Otherwise normal. b. 10/2015: cath for abnormal NST and atypical CP --> showed mild 30-40% stenosis in the prox-distal RCA  . GERD (gastroesophageal reflux disease)   . HA (headache)   . Hypertension   . 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Andre [name])   . TIA (transient ischemic attack)   . Weakness     Patient Active Problem List   Diagnosis Date Noted  . Acute upper respiratory infection 06/20/2016  . Chest pain 03/19/2016  . Back pain 02/29/2016  . Abnormal stress test 10/18/2015  . Chest pain with moderate risk for cardiac etiology 09/30/2015  . Erectile dysfunction 02/25/2015  . Cigarette smoker 02/10/2015  . COPD GOLD 0 11/08/2014  . Upper airway cough syndrome 08/10/2014  . Hemorrhoids 02/11/2014  . Constipation 05/30/2013  . Left pontine CVA (Andre [name]) 01/06/2013  . Headache 10/23/2011  . 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Andre [name]) 10/23/2011  . GERD (gastroesophageal reflux  disease) 10/23/2011  . Nicotine addiction 10/23/2011  . 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 Cervical stenosis of spinal canal 10/23/2011  . Essential hypertension, benign 07/10/2011    Past Surgical History:  Procedure Laterality Date  . CARDIAC CATHETERIZATION  January 2013   Proximal RCA 30-40%. Otherwise normal.  . CARDIAC CATHETERIZATION N/A 10/18/2015   Procedure: Left Heart Cath and Coronary Angiography;  Surgeon: Jettie Booze, MD;  Location: [facility];  Service: Cardiovascular;  Laterality: N/A;  . NM MYOVIEW LTD  March 2015   LOW RISK. No scar or ischemia. Normal EF (55%). No RWMA  . surgical excision of rectal cancer         Home Medications    Prior to Admission medications   Medication Sig Start Date End Date Taking? Authorizing Provider  amoxicillin (AMOXIL) 500 MG capsule Take 2 capsules (1,000 mg total) by mouth 2 (two) times daily. 7/32/20   Delora Fuel, MD  aspirin EC 81 MG tablet Take 81 mg by mouth daily.    Historical Provider, MD  benzonatate (TESSALON) 100 MG capsule Take 1 capsule (100 mg total) by mouth 3 (three) times daily as needed for cough. 06/23/16   Sherwood Gambler, MD  butalbital-acetaminophen-caffeine (FIORICET, ESGIC) [phone] MG tablet Take 1 tablet by mouth every 6 (six) hours as needed for headache. 02/25/16 02/24/17  Olivia Canter Sam, PA-C  guaiFENesin (ROBITUSSIN) 100 MG/5ML liquid Take 5-10 mLs (100-200 mg total) by mouth every 4 (four) hours as needed  for cough or congestion. 06/23/16   Sherwood Gambler, MD  nitroGLYCERIN (NITROSTAT) 0.4 MG SL tablet Place 1 tablet (0.4 mg total) under the tongue every 5 (five) minutes x 3 doses as needed for chest pain. 10/18/15   Erma Heritage, PA  nortriptyline (PAMELOR) 25 MG capsule Take 1 capsule (25 mg total) by mouth at bedtime. 02/29/16   Golden Circle, FNP  pantoprazole (PROTONIX) 40 MG tablet Take 1 tablet (40 mg total) by mouth daily. 06/20/16   Golden Circle, FNP  sucralfate  (CARAFATE) 1 g tablet Take 1 tablet (1 g total) by mouth 4 (four) times daily -  with meals and at bedtime. 03/30/16   Golden Circle, FNP  valsartan-hydrochlorothiazide (DIOVAN-HCT) 160-25 MG tablet take 1 tablet by mouth once daily 05/25/16   Golden Circle, FNP    Family History Family History  Problem Relation Age of Onset  . Hypertension Mother   . Cancer Mother   . Liver disease Father     Social History Social History  Substance Use Topics  . Smoking status: Current Some Day Smoker    Packs/day: 0.30    Years: 47.00    Types: Cigarettes  . Smokeless tobacco: Never Used  . Alcohol use 1.2 oz/week    2 Cans of beer per week     Allergies   Patient has no known allergies.   Review of Systems Review of Systems  Constitutional: Negative for fever.  HENT: Negative for ear pain.   Eyes: Positive for visual disturbance. Negative for photophobia.  Musculoskeletal: Negative for neck pain.  Neurological: Positive for headaches. Negative for weakness and numbness.  All other systems reviewed and are negative.    Physical Exam Updated Vital Signs BP 121/69 (BP Location: Left Arm)   Pulse (!) 56   Temp 98.5 F (36.9 C) (Oral)   Resp 17   SpO2 99%   Physical Exam  Constitutional: He is oriented to person, place, and time. He appears well-developed and well-nourished.  HENT:  Head: Normocephalic and atraumatic.  No significant sinus tenderness   Eyes: EOM are normal.  Neck: Normal range of motion. Neck supple.  No nuchal rigidity   Cardiovascular: Normal rate, regular rhythm, normal heart sounds and intact distal pulses.   Pulmonary/Chest: Effort normal and breath sounds normal. No respiratory distress.  Abdominal: Soft. He exhibits no distension. There is no tenderness.  Musculoskeletal: Normal range of motion.  Neurological: He is alert and oriented to person, place, and time. No cranial nerve deficit or sensory deficit. Coordination normal.  Good finger to  nose   Skin: Skin is warm and dry.  Psychiatric: He has a normal mood and affect. Judgment normal.  Nursing note and vitals reviewed.    ED Treatments / Results  DIAGNOSTIC STUDIES:  Oxygen Saturation is 99% on RA, normal by my interpretation.    COORDINATION OF CARE:  2:01 PM Discussed treatment plan with pt at bedside and pt agreed to plan.  Labs (all labs ordered are listed, but only abnormal results are displayed) Labs Reviewed - No data to display  EKG  EKG Interpretation None       Radiology No results found.  Procedures Procedures (including critical care time)  Medications Ordered in ED Medications  oxyCODONE-acetaminophen (PERCOCET/ROXICET) 5-325 MG per tablet 1 tablet (1 tablet Oral Given 07/10/16 1052)  oxyCODONE-acetaminophen (PERCOCET/ROXICET) 5-325 MG per tablet (not administered)     Initial Impression / Assessment and Plan / ED Course  I have reviewed the triage vital signs and the nursing notes.  Pertinent labs & imaging results that were available during my care of the patient were reviewed by me and considered in my medical decision making (see chart for details).      3:54 PM Pt still with HA at this time. No new complaints. Return precautions discussed. Head CT  without acute process.  Final Clinical Impressions(s) / ED Diagnoses   Final diagnoses:  Nonintractable headache, unspecified chronicity pattern, unspecified headache type    New Prescriptions New Prescriptions   No medications on file   I personally preformed the services scribed in my presence. The recorded information has been reviewed is accurate. Andre Manifold, MD.     Andre Manifold, MD 07/19/16 [phone]

## 2016-07-10 NOTE — ED Notes (Signed)
ED Provider at bedside. 

## 2016-07-10 NOTE — ED Notes (Signed)
Patient transported to CT 

## 2016-07-11 ENCOUNTER — Other Ambulatory Visit: Payer: Self-pay

## 2016-07-11 NOTE — Patient Outreach (Signed)
[facility]) Care Management  07/11/2016  Andre [name] 03/31/1941 [id number]   Referral Date: 07-11-16  Referral Source: ED Utilization report Referral Reason: High ED Utilization  Outreach Attempt: First Attempt Successful   Social: Patient lives alone and states he has family support.  Patient independent with all activities of daily living and drives himself. Patient does not have an advanced directive.  Conditions:  Patient admits to HTN and COPD.  He reports that his blood pressure is controlled with his medications.  He denies any needs at this with his blood pressure.  Patient also admits to COPD but states he has no problems with his breathing at all.   Medications:  Patient reports he is able to afford his medications and denies any questions about medications.    Services: Patient has declined care management services at this time but agrees to receive information.    Plan: RN Health Coach will send letter and brochure.  RN Health Coach will notify care management assistant of case closure status.    Jone Baseman, RN, MSN [place] [phone]

## 2016-07-12 ENCOUNTER — Ambulatory Visit (INDEPENDENT_AMBULATORY_CARE_PROVIDER_SITE_OTHER): Payer: Medicare Other | Admitting: Adult Health

## 2016-07-12 ENCOUNTER — Encounter: Payer: Self-pay | Admitting: Adult Health

## 2016-07-12 VITALS — BP 122/75 | HR 65 | Ht 71.0 in | Wt 204.4 lb

## 2016-07-12 DIAGNOSIS — R51 Headache: Secondary | ICD-10-CM | POA: Diagnosis not present

## 2016-07-12 DIAGNOSIS — G44221 Chronic tension-type headache, intractable: Secondary | ICD-10-CM | POA: Diagnosis not present

## 2016-07-12 DIAGNOSIS — R519 Headache, unspecified: Secondary | ICD-10-CM

## 2016-07-12 MED ORDER — NORTRIPTYLINE HCL 25 MG PO CAPS: 25.0000 mg | ORAL_CAPSULE | Freq: Every day | ORAL | 11 refills | Status: DC

## 2016-07-12 NOTE — Progress Notes (Signed)
I have read the note, and I agree with the clinical assessment and plan.  Kyandre Okray KEITH   

## 2016-07-12 NOTE — Patient Instructions (Signed)
Restart Nortriptyline 25 mg at bedtime If your symptoms worsen or you develop new symptoms please let us know.

## 2016-07-12 NOTE — Progress Notes (Signed)
PATIENT: Andre [name] DOB: 1941-05-31  REASON FOR VISIT: follow up HISTORY FROM: patient  HISTORY OF PRESENT ILLNESS: HISTORY per Dr. Krista Blue notes:[name] returns for follow-up for his headaches. He was initially evaluated in 11/30/11 for headaches with a persistent right temporal headache.  He was drunk, fell in Novemeber of 2012 and had LOC and abrasion to his face on the right,. He has had intermittent headaches since that time lasting 15-20 minutes several times a week. He has been hypertensive for 20 years, is on  B/P meds does not know the name. Also has a hx of GERD and is on meds, does not know the name,    He had a CT scan of the brain in 07/2011, which showed small Left pontine lacunar infarct. MRI of the brain 12/27/11 with chronic left pontine lacunar ischemic infarction, and chronic SVD. Mild atrophy also noted. MRA of the head normal. MRA of the neck demonstarated right vertebral artery origin is mildly narrowed, may be due to atherosclerosis.  Bil internal carotid arteries have no stenosis.   Depakote ER '250mg'$  qhs has been very effective for his headache prevention, but after he has run out of medication for about 6 months now, now he has recurrent headaches, mild bilateral frontal pressure headaches,   He denies visual change, no significant gait difficulty  UPDATE March 16th 2015:  Since last visit, he continues to have frequent daily headaches, bilateral frontal, he is taking Depakote ER 500 mg without significant side effect, without much change of his headache either, he continued to drink occasionally, denies recent history of head trauma, denied gait difficulty, mild blurry vision when looking closeup object  UPDATE Oct 22nd 2015:  I have reviewed MRI of the brain in August 29 2013, periventricular small vessel disease, evidence of left and tentorium pontine lacunar infarction, no acute lesions,  He is taking amlodipine, lisinopril, daily aspirin, but no  longer taking Depakote, has run out of refills  He continued to complain almost constant right frontal area headaches, 8 out of 10, took Aleve, aspirin, without helping,  He continued to drink occasionally, smokes daily, lives alone, drive himself to clinic today  UPDATE December 24 2014: He has been taken ASA as needed for his headaches, he gets headaches 2-4 times each week, lasting 30 minutes.  He is now taking nortriptyline 25 mg qhs, which did help his headaches some.  He drives here himself, he drinks occasionally    Today 07/12/2016. Mr. [name] is a 76 year old male with a history of tension-type headaches. He returns today for follow-up. The patient states that in the past he was placed on nortriptyline and had a good response for his headaches. Reports that his headaches eventually subsided so he discontinued the medication. Reports that he did well for a while but recently his headaches have returned. He states the headaches always occur on the right side of the head. He denies photophobia, phonophobia, nausea and vomiting. He states that headache may flare up 3-4 times a day. He describes the pain as a dull pain. Typically will last 20-30 minutes. Denies any tearing or congestion on the right side of the face. He reports that these headaches are the same headaches he had when he saw Dr. Tammi Klippel year ago. He returns today for an evaluation. The patient did go to the emergency room for his headache. He was given IV medication and a CT of the head was relatively unremarkable.  REVIEW OF SYSTEMS: Out of  a complete 14 system review of symptoms, the patient complains only of the following symptoms, and all other reviewed systems are negative.  Headache  ALLERGIES: No Known Allergies  HOME MEDICATIONS: Outpatient Medications Prior to Visit  Medication Sig Dispense Refill  . aspirin EC 81 MG tablet Take 81 mg by mouth daily.    . benzonatate (TESSALON) 100 MG capsule Take 1 capsule (100  mg total) by mouth 3 (three) times daily as needed for cough. 21 capsule 0  . guaiFENesin (ROBITUSSIN) 100 MG/5ML liquid Take 5-10 mLs (100-200 mg total) by mouth every 4 (four) hours as needed for cough or congestion. 60 mL 0  . nitroGLYCERIN (NITROSTAT) 0.4 MG SL tablet Place 1 tablet (0.4 mg total) under the tongue every 5 (five) minutes x 3 doses as needed for chest pain. 25 tablet 3  . valsartan-hydrochlorothiazide (DIOVAN-HCT) 160-25 MG tablet take 1 tablet by mouth once daily 90 tablet 0  . nortriptyline (PAMELOR) 25 MG capsule Take 1 capsule (25 mg total) by mouth at bedtime. 30 capsule 0  . amoxicillin (AMOXIL) 500 MG capsule Take 2 capsules (1,000 mg total) by mouth 2 (two) times daily. 40 capsule 0  . butalbital-acetaminophen-caffeine (FIORICET, ESGIC) 50-325-40 MG tablet Take 1 tablet by mouth every 6 (six) hours as needed for headache. 20 tablet 0  . pantoprazole (PROTONIX) 40 MG tablet Take 1 tablet (40 mg total) by mouth daily. 60 tablet 0  . sucralfate (CARAFATE) 1 g tablet Take 1 tablet (1 g total) by mouth 4 (four) times daily -  with meals and at bedtime. 120 tablet 0   No facility-administered medications prior to visit.     PAST MEDICAL HISTORY: Past Medical History:  Diagnosis Date  . Arthritis   . Coronary artery disease, non-occlusive    a. 06/2011: cath for abnormal exercise echo: Only 30-40% proximal RCA. Otherwise normal. b. 10/2015: cath for abnormal NST and atypical CP --> showed mild 30-40% stenosis in the prox-distal RCA  . GERD (gastroesophageal reflux disease)   . HA (headache)   . Hypertension   . 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[place])   . TIA (transient ischemic attack)   . Weakness     PAST SURGICAL HISTORY: Past Surgical History:  Procedure Laterality Date  . CARDIAC CATHETERIZATION  January 2013   Proximal RCA 30-40%. Otherwise normal.  . CARDIAC CATHETERIZATION N/A 10/18/2015   Procedure: Left Heart Cath and Coronary Angiography;  Surgeon: Jettie Booze, MD;   Location: [facility];  Service: Cardiovascular;  Laterality: N/A;  . NM MYOVIEW LTD  March 2015   LOW RISK. No scar or ischemia. Normal EF (55%). No RWMA  . surgical excision of rectal cancer      FAMILY HISTORY: Family History  Problem Relation Age of Onset  . Hypertension Mother   . Cancer Mother   . Liver disease Father     SOCIAL HISTORY: Social History   Social History  . Marital status: Single    Spouse name: N/A  . Number of children: 11  . Years of education: 12   Occupational History  . Retried     retired   Social History Main Topics  . Smoking status: Current Some Day Smoker    Packs/day: 0.30    Years: 47.00    Types: Cigarettes  . Smokeless tobacco: Never Used  . Alcohol use 1.2 oz/week    2 Cans of beer per week  . Drug use: No  . Sexual activity: Not on  file   Other Topics Concern  . Not on file   Social History Narrative   Patient is single, retired   Patient is right handed   Education level is high school   Caffeine consumption is 1 cup daily      PHYSICAL EXAM  Vitals:   07/12/16 0948  BP: 122/75  Pulse: 65  Weight: 204 lb 6.4 oz (92.7 kg)  Height: '5\' 11"'$  (1.803 m)   Body mass index is 28.51 kg/m.  Generalized: Well developed, in no acute distress   Neurological examination  Mentation: Alert oriented to time, place, history taking. Follows all commands speech and language fluent Cranial nerve II-XII: Pupils were equal round reactive to light. Extraocular movements were full, visual field were full on confrontational test. Facial sensation and strength were normal. Uvula tongue midline. Head turning and shoulder shrug  were normal and symmetric. Motor: The motor testing reveals 5 over 5 strength of all 4 extremities. Good symmetric motor tone is noted throughout.  Sensory: Sensory testing is intact to soft touch on all 4 extremities. No evidence of extinction is noted.  Coordination: Cerebellar testing reveals good  finger-nose-finger and heel-to-shin bilaterally.  Gait and station: Gait is normal.  Reflexes: Deep tendon reflexes are symmetric and normal bilaterally.   DIAGNOSTIC DATA (LABS, IMAGING, TESTING) - I reviewed patient records, labs, notes, testing and imaging myself where available.  Lab Results  Component Value Date   WBC 6.9 07/05/2016   HGB 15.3 07/05/2016   HCT 45.3 07/05/2016   MCV 94.8 07/05/2016   PLT 232 07/05/2016      Component Value Date/Time   NA 136 07/05/2016 0047   K 3.3 (L) 07/05/2016 0047   CL 96 (L) 07/05/2016 0047   CO2 25 07/05/2016 0047   GLUCOSE 138 (H) 07/05/2016 0047   BUN 8 07/05/2016 0047   CREATININE 0.90 07/05/2016 0047   CREATININE 0.81 11/03/2015 0918   CALCIUM 9.4 07/05/2016 0047   PROT 6.9 07/05/2016 0047   ALBUMIN 3.8 07/05/2016 0047   AST 44 (H) 07/05/2016 0047   ALT 30 07/05/2016 0047   ALKPHOS 92 07/05/2016 0047   BILITOT 0.9 07/05/2016 0047   GFRNONAA >60 07/05/2016 0047   GFRNONAA 88 11/03/2015 0918   GFRAA >60 07/05/2016 0047   GFRAA >89 11/03/2015 0918    Lab Results  Component Value Date   HGBA1C 6.6 (H) 03/19/2016   Lab Results  Component Value Date   VITAMINB12 258 02/23/2014   Lab Results  Component Value Date   TSH 1.042 10/17/2015      ASSESSMENT AND PLAN 76 y.o. year old male  has a past medical history of Arthritis; Coronary artery disease, non-occlusive; GERD (gastroesophageal reflux disease); HA (headache); Hypertension; 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[place]); TIA (transient ischemic attack); and Weakness. here with:  1. Daily headache 2. Tension-type headache  The patient's headaches have returned. He is having daily headaches. The patient will restart nortriptyline 25 mg at bedtime. Advised patient that if his headaches do not improve he should let us know. He will follow-up in 4-5 months with Dr. Krista Blue.     Ward Givens, MSN, NP-C 07/12/2016, 10:08 AM [facility] [address], [place], [place] [address] [phone]

## 2016-07-13 ENCOUNTER — Ambulatory Visit: Payer: Medicare Other | Admitting: Family

## 2016-07-19 NOTE — Progress Notes (Signed)
I have reviewed and agreed above plan. 

## 2016-08-30 ENCOUNTER — Other Ambulatory Visit: Payer: Self-pay | Admitting: Family

## 2016-08-30 DIAGNOSIS — I10 Essential (primary) hypertension: Secondary | ICD-10-CM

## 2016-08-30 MED ORDER — VALSARTAN-HYDROCHLOROTHIAZIDE 160-25 MG PO TABS: 1.0000 | ORAL_TABLET | Freq: Every day | ORAL | 1 refills | Status: DC

## 2016-10-16 ENCOUNTER — Ambulatory Visit (INDEPENDENT_AMBULATORY_CARE_PROVIDER_SITE_OTHER): Payer: Medicare Other | Admitting: Family

## 2016-10-16 ENCOUNTER — Encounter: Payer: Self-pay | Admitting: Family

## 2016-10-16 DIAGNOSIS — R05 Cough: Secondary | ICD-10-CM

## 2016-10-16 DIAGNOSIS — R059 Cough, unspecified: Secondary | ICD-10-CM | POA: Insufficient documentation

## 2016-10-16 MED ORDER — DM-GUAIFENESIN ER 30-600 MG PO TB12: 1.0000 | ORAL_TABLET | Freq: Two times a day (BID) | ORAL | 0 refills | Status: DC

## 2016-10-16 MED ORDER — OMEPRAZOLE 20 MG PO CPDR: 20.0000 mg | DELAYED_RELEASE_CAPSULE | Freq: Every day | ORAL | 1 refills | Status: DC

## 2016-10-16 NOTE — Progress Notes (Signed)
Subjective:    Patient ID: Andre [name], male    DOB: Oct 11, 1940, 76 y.o.   MRN: [id number]  Chief Complaint  Patient presents with  . Cough    coughing up alot of mucus that is yellow with some blood, x2 weeks    HPI:   Andre [name] is a 76 y.o. male who  has a past medical history of Arthritis; Coronary artery disease, non-occlusive; GERD (gastroesophageal reflux disease); HA (headache); Hypertension; 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[place]); TIA (transient ischemic attack); and Weakness. and presents today for an acute office visit.  This is a new problem. Associated symptoms of a productive cough and occasional headaches that has been going on for about 2 weeks. Mucus is described as yellowish and will occasionally have some blood tinge to it. Denies fevers. Denies any modifying factors or attempted treatments. No sick contacts or recent antibiotics. Reflux has been under control.  No Known Allergies    Outpatient Medications Prior to Visit  Medication Sig Dispense Refill  . aspirin EC 81 MG tablet Take 81 mg by mouth daily.    . benzonatate (TESSALON) 100 MG capsule Take 1 capsule (100 mg total) by mouth 3 (three) times daily as needed for cough. 21 capsule 0  . guaiFENesin (ROBITUSSIN) 100 MG/5ML liquid Take 5-10 mLs (100-200 mg total) by mouth every 4 (four) hours as needed for cough or congestion. 60 mL 0  . nitroGLYCERIN (NITROSTAT) 0.4 MG SL tablet Place 1 tablet (0.4 mg total) under the tongue every 5 (five) minutes x 3 doses as needed for chest pain. 25 tablet 3  . nortriptyline (PAMELOR) 25 MG capsule Take 1 capsule (25 mg total) by mouth at bedtime. 30 capsule 11  . valsartan-hydrochlorothiazide (DIOVAN-HCT) 160-25 MG tablet Take 1 tablet by mouth daily. 90 tablet 1   No facility-administered medications prior to visit.     Review of Systems  Constitutional: Negative for chills and fever.  HENT: Positive for sore throat. Negative for congestion, sinus pain and sinus pressure.     Respiratory: Positive for cough and shortness of breath (occasional). Negative for chest tightness and wheezing.   Cardiovascular: Negative for chest pain and leg swelling.  Neurological: Positive for headaches.      Objective:    BP 110/68 (BP Location: Left Arm, Patient Position: Sitting, Cuff Size: Large)   Pulse 75   Temp 98 F (36.7 C) (Oral)   Resp 16   Ht '5\' 11"'$  (1.803 m)   Wt 205 lb 6.4 oz (93.2 kg)   SpO2 93%   BMI 28.65 kg/m  Nursing note and vital signs reviewed.  Physical Exam  Constitutional: He is oriented to person, place, and time. He appears well-developed and well-nourished. No distress.  HENT:  Right Ear: Hearing, tympanic membrane, external ear and ear canal normal.  Left Ear: Hearing, tympanic membrane, external ear and ear canal normal.  Nose: Nose normal.  Mouth/Throat: Uvula is midline, oropharynx is clear and moist and mucous membranes are normal.  Cardiovascular: Normal rate, regular rhythm, normal heart sounds and intact distal pulses.  Exam reveals no gallop and no friction rub.   No murmur heard. Pulmonary/Chest: Effort normal and breath sounds normal. No respiratory distress. He has no wheezes. He has no rales. He exhibits no tenderness.  Neurological: He is alert and oriented to person, place, and time.  Skin: Skin is warm and dry.  Psychiatric: He has a normal mood and affect. His behavior is normal. Judgment and thought content normal.  Assessment & Plan:   Problem List Items Addressed This Visit      Other   Cough    Cough with concern for exacerbation of gastroesophageal reflux versus acute upper respiratory infection. Start Mucinex DM and omeprazole. Continue over-the-counter medications as needed for symptom relief and supportive care. Follow-up if symptoms worsen or do not improve.          I am having Mr. Staples start on dextromethorphan-guaiFENesin and omeprazole. I am also having him maintain his nitroGLYCERIN, aspirin EC,  guaiFENesin, benzonatate, nortriptyline, and valsartan-hydrochlorothiazide.   Meds ordered this encounter  Medications  . dextromethorphan-guaiFENesin (MUCINEX DM) 30-600 MG 12hr tablet    Sig: Take 1 tablet by mouth 2 (two) times daily.    Dispense:  20 tablet    Refill:  0    Order Specific Question:   Supervising Provider    Answer:   Pricilla Holm A [[id number]]  . omeprazole (PRILOSEC) 20 MG capsule    Sig: Take 1 capsule (20 mg total) by mouth daily.    Dispense:  30 capsule    Refill:  1    Order Specific Question:   Supervising Provider    Answer:   Pricilla Holm A [[id number]]     Follow-up: Return if symptoms worsen or fail to improve.  Mauricio Po, FNP

## 2016-10-16 NOTE — Patient Instructions (Signed)
Thank you for choosing [facility].  SUMMARY AND INSTRUCTIONS:  Start Mucinex-DM twice daily.  Start omeprazole daily for 2 weeks and then as needed.   Drink plenty of fluids.   Medication:  Your prescription(s) have been submitted to your pharmacy or been printed and provided for you. Please take as directed and contact our office if you believe you are having problem(s) with the medication(s) or have any questions.  Follow up:  If your symptoms worsen or fail to improve, please contact our office for further instruction, or in case of emergency go directly to the emergency room at the closest medical facility.   General Recommendations:    Please drink plenty of fluids.  Get plenty of rest   Sleep in humidified air  Use saline nasal sprays  Netti pot   OTC Medications:  Decongestants - helps relieve congestion   Flonase (generic fluticasone) or Nasacort (generic triamcinolone) - please make sure to use the "cross-over" technique at a 45 degree angle towards the opposite eye as opposed to straight up the nasal passageway.   Sudafed (generic pseudoephedrine - Note this is the one that is available behind the pharmacy counter); Products with phenylephrine (-PE) may also be used but is often not as effective as pseudoephedrine.   If you have HIGH BLOOD PRESSURE - Coricidin HBP; AVOID any product that is -D as this contains pseudoephedrine which may increase your blood pressure.  Afrin (oxymetazoline) every 6-8 hours for up to 3 days.   Allergies - helps relieve runny nose, itchy eyes and sneezing   Claritin (generic loratidine), Allegra (fexofenidine), or Zyrtec (generic cyrterizine) for runny nose. These medications should not cause drowsiness.  Note - Benadryl (generic diphenhydramine) may be used however may cause drowsiness  Cough -   Delsym or Robitussin (generic dextromethorphan)  Expectorants - helps loosen mucus to ease removal   Mucinex  (generic guaifenesin) as directed on the package.  Headaches / General Aches   Tylenol (generic acetaminophen) - DO NOT EXCEED 3 grams (3,000 mg) in a 24 hour time period  Advil/Motrin (generic ibuprofen)   Sore Throat -   Salt water gargle   Chloraseptic (generic benzocaine) spray or lozenges / Sucrets (generic dyclonine)

## 2016-10-16 NOTE — Assessment & Plan Note (Signed)
Cough with concern for exacerbation of gastroesophageal reflux versus acute upper respiratory infection. Start Mucinex DM and omeprazole. Continue over-the-counter medications as needed for symptom relief and supportive care. Follow-up if symptoms worsen or do not improve.

## 2016-11-09 ENCOUNTER — Ambulatory Visit: Payer: Medicare Other | Admitting: Neurology

## 2016-11-22 ENCOUNTER — Ambulatory Visit (INDEPENDENT_AMBULATORY_CARE_PROVIDER_SITE_OTHER): Payer: Medicare Other | Admitting: Neurology

## 2016-11-22 ENCOUNTER — Encounter: Payer: Self-pay | Admitting: Neurology

## 2016-11-22 VITALS — BP 100/64 | HR 58 | Ht 71.0 in | Wt 200.5 lb

## 2016-11-22 DIAGNOSIS — I639 Cerebral infarction, unspecified: Secondary | ICD-10-CM

## 2016-11-22 DIAGNOSIS — R51 Headache: Secondary | ICD-10-CM | POA: Diagnosis not present

## 2016-11-22 DIAGNOSIS — I6350 Cerebral infarction due to unspecified occlusion or stenosis of unspecified cerebral artery: Secondary | ICD-10-CM | POA: Diagnosis not present

## 2016-11-22 DIAGNOSIS — R519 Headache, unspecified: Secondary | ICD-10-CM

## 2016-11-22 MED ORDER — NORTRIPTYLINE HCL 25 MG PO CAPS: 25.0000 mg | ORAL_CAPSULE | Freq: Every day | ORAL | 4 refills | Status: DC

## 2016-11-22 NOTE — Progress Notes (Signed)
PATIENT: Andre [name] DOB: 04/30/1941  REASON FOR VISIT: follow up HISTORY FROM: patient  HISTORY OF PRESENT ILLNESS: HISTORY :   Andre [name] returns for follow-up for his headaches. He was initially evaluated in 11/30/11 for headaches with a persistent right temporal headache.  He was drunk, fell in Novemeber of 2012 and had LOC and abrasion to his face on the right,. He has had intermittent headaches since that time lasting 15-20 minutes several times a week. He has been hypertensive for 20 years, is on  B/P meds does not know the name. Also has a hx of GERD and is on meds, does not know the name,    He had a CT scan of the brain in 07/2011, which showed small Left pontine lacunar infarct. MRI of the brain 12/27/11 with chronic left pontine lacunar ischemic infarction, and chronic SVD. Mild atrophy also noted. MRA of the head normal. MRA of the neck demonstarated right vertebral artery origin is mildly narrowed, may be due to atherosclerosis.  Bil internal carotid arteries have no stenosis.   Depakote ER 246m qhs has been very effective for his headache prevention, but after he has run out of medication for about 6 months now, now he has recurrent headaches, mild bilateral frontal pressure headaches,   He denies visual change, no significant gait difficulty  UPDATE March 16th 2015:  Since last visit, he continues to have frequent daily headaches, bilateral frontal, he is taking Depakote ER 500 mg without significant side effect, without much change of his headache either, he continued to drink occasionally, denies recent history of head trauma, denied gait difficulty, mild blurry vision when looking closeup object  UPDATE Oct 22nd 2015:  I have reviewed MRI of the brain in August 29 2013, periventricular small vessel disease, evidence of left and tentorium pontine lacunar infarction, no acute lesions,  He is taking amlodipine, lisinopril, daily aspirin, but no longer taking  Depakote, has run out of refills  He continued to complain almost constant right frontal area headaches, 8 out of 10, took Aleve, aspirin, without helping,  He continued to drink occasionally, smokes daily, lives alone, drive himself to clinic today  UPDATE December 24 2014: He has been taken ASA as needed for his headaches, he gets headaches 2-4 times each week, lasting 30 minutes.  He is now taking nortriptyline 25 mg qhs, which did help his headaches some.  He drives here himself, he drinks occasionally    Update November 22 2016. Last clinical visit was with MJinny Blossomon November 22 2016, but had a recurrent headache of after stop nortriptyline, he was given the prescription of nortriptyline 249mqhs,   He continue have frequent headaches, has run out of nortriptyline, previously responded well  REVIEW OF SYSTEMS: Out of a complete 14 system review of symptoms, the patient complains only of the following symptoms, and all other reviewed systems are negative.  Headache  ALLERGIES: No Known Allergies  HOME MEDICATIONS: Outpatient Medications Prior to Visit  Medication Sig Dispense Refill  . aspirin EC 81 MG tablet Take 81 mg by mouth daily.    . nortriptyline (PAMELOR) 25 MG capsule Take 1 capsule (25 mg total) by mouth at bedtime. 30 capsule 11  . valsartan-hydrochlorothiazide (DIOVAN-HCT) 160-25 MG tablet Take 1 tablet by mouth daily. 90 tablet 1  . nitroGLYCERIN (NITROSTAT) 0.4 MG SL tablet Place 1 tablet (0.4 mg total) under the tongue every 5 (five) minutes x 3 doses as needed for chest pain. (  Patient not taking: Reported on 11/22/2016) 25 tablet 3  . omeprazole (PRILOSEC) 20 MG capsule Take 1 capsule (20 mg total) by mouth daily. (Patient not taking: Reported on 11/22/2016) 30 capsule 1  . benzonatate (TESSALON) 100 MG capsule Take 1 capsule (100 mg total) by mouth 3 (three) times daily as needed for cough. 21 capsule 0  . dextromethorphan-guaiFENesin (MUCINEX DM) 30-600 MG 12hr tablet  Take 1 tablet by mouth 2 (two) times daily. 20 tablet 0  . guaiFENesin (ROBITUSSIN) 100 MG/5ML liquid Take 5-10 mLs (100-200 mg total) by mouth every 4 (four) hours as needed for cough or congestion. 60 mL 0   No facility-administered medications prior to visit.     PAST MEDICAL HISTORY: Past Medical History:  Diagnosis Date  . Arthritis   . Coronary artery disease, non-occlusive    a. 06/2011: cath for abnormal exercise echo: Only 30-40% proximal RCA. Otherwise normal. b. 10/2015: cath for abnormal NST and atypical CP --> showed mild 30-40% stenosis in the prox-distal RCA  . GERD (gastroesophageal reflux disease)   . HA (headache)   . Hypertension   . 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[place])   . TIA (transient ischemic attack)   . Weakness     PAST SURGICAL HISTORY: Past Surgical History:  Procedure Laterality Date  . CARDIAC CATHETERIZATION  January 2013   Proximal RCA 30-40%. Otherwise normal.  . CARDIAC CATHETERIZATION N/A 10/18/2015   Procedure: Left Heart Cath and Coronary Angiography;  Surgeon: Jettie Booze, MD;  Location: [facility];  Service: Cardiovascular;  Laterality: N/A;  . NM MYOVIEW LTD  March 2015   LOW RISK. No scar or ischemia. Normal EF (55%). No RWMA  . surgical excision of rectal cancer      FAMILY HISTORY: Family History  Problem Relation Age of Onset  . Hypertension Mother   . Cancer Mother   . Liver disease Father     SOCIAL HISTORY: Social History   Social History  . Marital status: Single    Spouse name: N/A  . Number of children: 11  . Years of education: 63   Occupational History  . Retried     retired   Social History Main Topics  . Smoking status: Current Some Day Smoker    Packs/day: 0.30    Years: 47.00    Types: Cigarettes  . Smokeless tobacco: Never Used  . Alcohol use 1.2 oz/week    2 Cans of beer per week  . Drug use: No  . Sexual activity: Not on file   Other Topics Concern  . Not on file   Social History Narrative    Patient is single, retired   Patient is right handed   Education level is high school   Caffeine consumption is 1 cup daily      PHYSICAL EXAM  Vitals:   11/22/16 0931  BP: 100/64  Pulse: (!) 58  Weight: 200 lb 8 oz (90.9 kg)  Height: 5' 11" (1.803 m)   Body mass index is 27.96 kg/m.  PHYSICAL EXAMNIATION:  Gen: NAD, conversant, well nourised, obese, well groomed                     Cardiovascular: Regular rate rhythm, no peripheral edema, warm, nontender. Eyes: Conjunctivae clear without exudates or hemorrhage Neck: Supple, no carotid bruits. Pulmonary: Clear to auscultation bilaterally   NEUROLOGICAL EXAM:  MENTAL STATUS: Speech:    Speech is normal; fluent and spontaneous with normal comprehension.  Cognition:  Orientation to time, place and person     Normal recent and remote memory     Normal Attention span and concentration     Normal Language, naming, repeating,spontaneous speech     Fund of knowledge   CRANIAL NERVES: CN II: Visual fields are full to confrontation. Fundoscopic exam is normal with sharp discs and no vascular changes. Pupils are round equal and briskly reactive to light. CN III, IV, VI: extraocular movement are normal. No ptosis. CN V: Facial sensation is intact to pinprick in all 3 divisions bilaterally. Corneal responses are intact.  CN VII: Face is symmetric with normal eye closure and smile. CN VIII: Hearing is normal to rubbing fingers CN IX, X: Palate elevates symmetrically. Phonation is normal. CN XI: Head turning and shoulder shrug are intact CN XII: Tongue is midline with normal movements and no atrophy.  MOTOR: There is no pronator drift of out-stretched arms. Muscle bulk and tone are normal. Muscle strength is normal.  REFLEXES: Reflexes are 2+ and symmetric at the biceps, triceps, knees, and ankles. Plantar responses are flexor.  SENSORY: Intact to light touch, pinprick, positional and vibratory sensation are intact in  fingers and toes.  COORDINATION: Rapid alternating movements and fine finger movements are intact. There is no dysmetria on finger-to-nose and heel-knee-shin.    GAIT/STANCE: Mildly wide based, unsteady  DIAGNOSTIC DATA (LABS, IMAGING, TESTING) - I reviewed patient records, labs, notes, testing and imaging myself where available.  Lab Results  Component Value Date   WBC 6.9 07/05/2016   HGB 15.3 07/05/2016   HCT 45.3 07/05/2016   MCV 94.8 07/05/2016   PLT 232 07/05/2016      Component Value Date/Time   NA 136 07/05/2016 0047   K 3.3 (L) 07/05/2016 0047   CL 96 (L) 07/05/2016 0047   CO2 25 07/05/2016 0047   GLUCOSE 138 (H) 07/05/2016 0047   BUN 8 07/05/2016 0047   CREATININE 0.90 07/05/2016 0047   CREATININE 0.81 11/03/2015 0918   CALCIUM 9.4 07/05/2016 0047   PROT 6.9 07/05/2016 0047   ALBUMIN 3.8 07/05/2016 0047   AST 44 (H) 07/05/2016 0047   ALT 30 07/05/2016 0047   ALKPHOS 92 07/05/2016 0047   BILITOT 0.9 07/05/2016 0047   GFRNONAA >60 07/05/2016 0047   GFRNONAA 88 11/03/2015 0918   GFRAA >60 07/05/2016 0047   GFRAA >89 11/03/2015 0918    Lab Results  Component Value Date   HGBA1C 6.6 (H) 03/19/2016   Lab Results  Component Value Date   VITAMINB12 258 02/23/2014   Lab Results  Component Value Date   TSH 1.042 10/17/2015      ASSESSMENT AND PLAN 76 y.o. year old   Chronic headache  ESR C-reactive protein to rule out temporal arteritis  Restarted her triptan 25 mg every night as preventative medications,  As needed NSAIDs Marcial Pacas, M.D. Ph.D.  [facility] [place], [place] [address] Phone: [phone] Fax:      [phone]

## 2016-11-23 LAB — SEDIMENTATION RATE: Sed Rate: 14 mm/hr (ref 0–30)

## 2016-11-23 LAB — C-REACTIVE PROTEIN: CRP: 10.2 mg/L — ABNORMAL HIGH (ref 0.0–4.9)

## 2016-11-23 LAB — TSH: TSH: 0.95 u[IU]/mL (ref 0.450–4.500)

## 2016-12-08 ENCOUNTER — Other Ambulatory Visit: Payer: Self-pay | Admitting: Family

## 2016-12-10 ENCOUNTER — Emergency Department (HOSPITAL_COMMUNITY): Payer: Medicare Other

## 2016-12-10 ENCOUNTER — Emergency Department (HOSPITAL_COMMUNITY)
Admission: EM | Admit: 2016-12-10 | Discharge: 2016-12-10 | Disposition: A | Discharge status: Home / Self Care | Payer: Medicare Other | Attending: Emergency Medicine | Admitting: Emergency Medicine

## 2016-12-10 ENCOUNTER — Encounter (HOSPITAL_COMMUNITY): Payer: Self-pay | Admitting: *Deleted

## 2016-12-10 DIAGNOSIS — Z85048 Personal history of other malignant neoplasm of rectum, rectosigmoid junction, and anus: Secondary | ICD-10-CM | POA: Insufficient documentation

## 2016-12-10 DIAGNOSIS — I2510 Atherosclerotic heart disease of native coronary artery without angina pectoris: Secondary | ICD-10-CM | POA: Diagnosis not present

## 2016-12-10 DIAGNOSIS — I10 Essential (primary) hypertension: Secondary | ICD-10-CM | POA: Insufficient documentation

## 2016-12-10 DIAGNOSIS — F17210 Nicotine dependence, cigarettes, uncomplicated: Secondary | ICD-10-CM | POA: Diagnosis not present

## 2016-12-10 DIAGNOSIS — Z79899 Other long term (current) drug therapy: Secondary | ICD-10-CM | POA: Insufficient documentation

## 2016-12-10 DIAGNOSIS — Z7982 Long term (current) use of aspirin: Secondary | ICD-10-CM | POA: Insufficient documentation

## 2016-12-10 DIAGNOSIS — J441 Chronic obstructive pulmonary disease with (acute) exacerbation: Secondary | ICD-10-CM | POA: Insufficient documentation

## 2016-12-10 DIAGNOSIS — R05 Cough: Secondary | ICD-10-CM | POA: Diagnosis not present

## 2016-12-10 DIAGNOSIS — Z8673 Personal history of transient ischemic attack (TIA), and cerebral infarction without residual deficits: Secondary | ICD-10-CM | POA: Diagnosis not present

## 2016-12-10 LAB — CBC WITH DIFFERENTIAL/PLATELET
BASOS ABS: 0 10*3/uL (ref 0.0–0.1)
BASOS PCT: 0 %
Eosinophils Absolute: 0.2 10*3/uL (ref 0.0–0.7)
Eosinophils Relative: 2 %
HEMATOCRIT: 39.8 % (ref 39.0–52.0)
HEMOGLOBIN: 13.2 g/dL (ref 13.0–17.0)
Lymphocytes Relative: 32 %
Lymphs Abs: 2.7 10*3/uL (ref 0.7–4.0)
MCH: 31.2 pg (ref 26.0–34.0)
MCHC: 33.2 g/dL (ref 30.0–36.0)
MCV: 94.1 fL (ref 78.0–100.0)
Monocytes Absolute: 0.6 10*3/uL (ref 0.1–1.0)
Monocytes Relative: 7 %
NEUTROS ABS: 4.8 10*3/uL (ref 1.7–7.7)
NEUTROS PCT: 59 %
Platelets: 166 10*3/uL (ref 150–400)
RBC: 4.23 MIL/uL (ref 4.22–5.81)
RDW: 14.8 % (ref 11.5–15.5)
WBC: 8.2 10*3/uL (ref 4.0–10.5)

## 2016-12-10 LAB — BASIC METABOLIC PANEL
ANION GAP: 8 (ref 5–15)
BUN: <5 mg/dL — ABNORMAL LOW (ref 6–20)
CO2: 25 mmol/L (ref 22–32)
Calcium: 8.5 mg/dL — ABNORMAL LOW (ref 8.9–10.3)
Chloride: 104 mmol/L (ref 101–111)
Creatinine, Ser: 0.83 mg/dL (ref 0.61–1.24)
GFR calc Af Amer: >60 mL/min (ref >60)
GLUCOSE: 112 mg/dL — AB (ref 65–99)
POTASSIUM: 3.2 mmol/L — AB (ref 3.5–5.1)
SODIUM: 137 mmol/L (ref 135–145)

## 2016-12-10 LAB — TROPONIN I: Troponin I: <0.03 ng/mL

## 2016-12-10 MED ORDER — DEXAMETHASONE SODIUM PHOSPHATE 10 MG/ML IJ SOLN
10.0000 mg | Freq: Once | INTRAMUSCULAR | Status: DC
  Filled 2016-12-10: qty 1

## 2016-12-10 MED ORDER — DOXYCYCLINE HYCLATE 100 MG PO CAPS: 100.0000 mg | ORAL_CAPSULE | Freq: Two times a day (BID) | ORAL | 0 refills | Status: DC

## 2016-12-10 MED ORDER — DOXYCYCLINE HYCLATE 100 MG PO TABS
100.0000 mg | ORAL_TABLET | Freq: Once | ORAL | Status: AC
Start: 2016-12-10 — End: 2016-12-10
  Administered 2016-12-10: 100 mg via ORAL
  Filled 2016-12-10: qty 1

## 2016-12-10 MED ORDER — ALBUTEROL SULFATE HFA 108 (90 BASE) MCG/ACT IN AERS: 2.0000 | INHALATION_SPRAY | Freq: Four times a day (QID) | RESPIRATORY_TRACT | 2 refills | Status: DC | PRN

## 2016-12-10 MED ORDER — DEXAMETHASONE SODIUM PHOSPHATE 4 MG/ML IJ SOLN
10.0000 mg | Freq: Once | INTRAMUSCULAR | Status: AC
  Administered 2016-12-10: 10 mg via INTRAMUSCULAR
  Filled 2016-12-10: qty 3

## 2016-12-10 MED ORDER — POTASSIUM CHLORIDE CRYS ER 20 MEQ PO TBCR
40.0000 meq | EXTENDED_RELEASE_TABLET | Freq: Once | ORAL | Status: AC
  Administered 2016-12-10: 40 meq via ORAL
  Filled 2016-12-10: qty 2

## 2016-12-10 MED ORDER — IPRATROPIUM-ALBUTEROL 0.5-2.5 (3) MG/3ML IN SOLN
3.0000 mL | Freq: Once | RESPIRATORY_TRACT | Status: AC
  Administered 2016-12-10: 3 mL via RESPIRATORY_TRACT
  Filled 2016-12-10: qty 3

## 2016-12-10 MED ORDER — PREDNISONE 10 MG PO TABS: 60.0000 mg | ORAL_TABLET | Freq: Every day | ORAL | 0 refills | Status: DC

## 2016-12-10 NOTE — ED Provider Notes (Signed)
[facility] DEPT Provider Note   CSN: [id number] Arrival date & time: 12/10/16  0645     History   Chief Complaint Chief Complaint  Patient presents with  . URI    HPI Andre [name] is a 76 y.o. male.  HPI  Pt with hx of CAD, HTN comes in with cc of chest pain, cough. Chest pain is non radiating, substernal and constant x 4 days. Pt has no specific aggravating or relieving factors. PT's cough is producing yellow phlegm that sometimes has blood in it.  Past Medical History:  Diagnosis Date  . Arthritis   . Coronary artery disease, non-occlusive    a. 06/2011: cath for abnormal exercise echo: Only 30-40% proximal RCA. Otherwise normal. b. 10/2015: cath for abnormal NST and atypical CP --> showed mild 30-40% stenosis in the prox-distal RCA  . GERD (gastroesophageal reflux disease)   . HA (headache)   . Hypertension   . 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[place])   . TIA (transient ischemic attack)   . Weakness     Patient Active Problem List   Diagnosis Date Noted  . Cough 10/16/2016  . Acute upper respiratory infection 06/20/2016  . Chest pain 03/19/2016  . Back pain 02/29/2016  . Abnormal stress test 10/18/2015  . Chest pain with moderate risk for cardiac etiology 09/30/2015  . Erectile dysfunction 02/25/2015  . Cigarette smoker 02/10/2015  . COPD GOLD 0 11/08/2014  . Upper airway cough syndrome 08/10/2014  . Hemorrhoids 02/11/2014  . Constipation 05/30/2013  . Left pontine CVA ([place]) 01/06/2013  . Headache 10/23/2011  . 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[place]) 10/23/2011  . GERD (gastroesophageal reflux disease) 10/23/2011  . Nicotine addiction 10/23/2011  . 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 Cervical stenosis of spinal canal 10/23/2011  . Essential hypertension, benign 07/10/2011    Past Surgical History:  Procedure Laterality Date  . CARDIAC CATHETERIZATION  January 2013   Proximal RCA 30-40%. Otherwise normal.  . CARDIAC CATHETERIZATION N/A 10/18/2015   Procedure: Left Heart Cath and  Coronary Angiography;  Surgeon: Jettie Booze, MD;  Location: [facility];  Service: Cardiovascular;  Laterality: N/A;  . NM MYOVIEW LTD  March 2015   LOW RISK. No scar or ischemia. Normal EF (55%). No RWMA  . surgical excision of rectal cancer         Home Medications    Prior to Admission medications   Medication Sig Start Date End Date Taking? Authorizing Provider  aspirin EC 81 MG tablet Take 81 mg by mouth daily.   Yes [provider]  nitroGLYCERIN (NITROSTAT) 0.4 MG SL tablet Place 1 tablet (0.4 mg total) under the tongue every 5 (five) minutes x 3 doses as needed for chest pain. 10/18/15  Yes Strader, [place] M, PA-C  nortriptyline (PAMELOR) 25 MG capsule Take 1 capsule (25 mg total) by mouth at bedtime. 11/22/16  Yes Marcial Pacas, MD  omeprazole (PRILOSEC) 20 MG capsule take 1 capsule by mouth once daily 12/08/16  Yes Calone, Ples Specter, FNP  valsartan-hydrochlorothiazide (DIOVAN-HCT) 160-25 MG tablet Take 1 tablet by mouth daily. 08/30/16  Yes Golden Circle, FNP  albuterol (PROVENTIL HFA;VENTOLIN HFA) 108 (90 Base) MCG/ACT inhaler Inhale 2 puffs into the lungs every 6 (six) hours as needed for wheezing or shortness of breath. 12/10/16   [name], MD  doxycycline (VIBRAMYCIN) 100 MG capsule Take 1 capsule (100 mg total) by mouth 2 (two) times daily. 12/10/16   [name], MD  predniSONE (DELTASONE) 10 MG tablet Take  6 tablets (60 mg total) by mouth daily. 12/11/16   [name], MD    Family History Family History  Problem Relation Age of Onset  . Hypertension Mother   . Cancer Mother   . Liver disease Father     Social History Social History  Substance Use Topics  . Smoking status: Current Some Day Smoker    Packs/day: 0.30    Years: 47.00    Types: Cigarettes  . Smokeless tobacco: Never Used  . Alcohol use 1.2 oz/week    2 Cans of beer per week     Allergies   Patient has no known allergies.   Review of Systems Review of Systems   Respiratory: Positive for cough and shortness of breath.   Cardiovascular: Positive for chest pain.  Allergic/Immunologic: Negative for immunocompromised state.  Hematological: Does not bruise/bleed easily.  All other systems reviewed and are negative.    Physical Exam Updated Vital Signs BP (!) 153/81   Pulse (!) 59   Temp 97.8 F (36.6 C) (Oral)   Resp 17   SpO2 92%   Physical Exam  Constitutional: He is oriented to person, place, and time. He appears well-developed.  HENT:  Head: Atraumatic.  Neck: Neck supple.  Cardiovascular: Normal rate and intact distal pulses.   Pulmonary/Chest: Effort normal. No respiratory distress. He has no wheezes.  Neurological: He is alert and oriented to person, place, and time.  Skin: Skin is warm.  Nursing note and vitals reviewed.    ED Treatments / Results  Labs (all labs ordered are listed, but only abnormal results are displayed) Labs Reviewed  BASIC METABOLIC PANEL - Abnormal; Notable for the following:       Result Value   Potassium 3.2 (*)    Glucose, Bld 112 (*)    BUN <5 (*)    Calcium 8.5 (*)    All other components within normal limits  TROPONIN I  CBC WITH DIFFERENTIAL/PLATELET    EKG  EKG Interpretation  Date/Time:  Sunday December 10 2016 06:59:06 EDT Ventricular Rate:  58 PR Interval:    QRS Duration: 105 QT Interval:  496 QTC Calculation: 488 R Axis:   32 Text Interpretation:  Sinus rhythm Ventricular premature complex Nonspecific repol abnormality, diffuse leads Borderline prolonged QT interval Baseline wander in lead(s) V6 When compared with ECG of 07/05/2016, Nonspecific T wave abnormality is now present Confirmed by Delora Fuel ([id number]) on 12/10/2016 7:03:20 AM       Radiology Dg Chest 2 View  Result Date: 12/10/2016 CLINICAL DATA:  Productive cough for 2 weeks.  COPD. EXAM: CHEST  2 VIEW COMPARISON:  07/05/2016 FINDINGS: Stable mild cardiomegaly. Stable pulmonary hyperinflation and pulmonary interstitial  prominence. Stable thickening of right minor fissure. No evidence of acute infiltrate or edema. No evidence of pneumothorax or pleural effusion. IMPRESSION: Stable mild cardiomegaly and probable COPD.  No acute findings. Electronically Signed   By: Earle Gell M.D.   On: 12/10/2016 08:43    Procedures Procedures (including critical care time)  MAY, 2017 CATH: Procedures  10/18/15 Left Heart Cath and Coronary Angiography  Conclusion    Nonobstructive CAD.  Mildly elevated LVEDP.  LV was not injected due to tortuosity in the radial and subclavian.  If cath was needed in the future, would not use right radial approach.     Medications Ordered in ED Medications  ipratropium-albuterol (DUONEB) 0.5-2.5 (3) MG/3ML nebulizer solution 3 mL (3 mLs Nebulization Given 12/10/16 0750)  potassium chloride SA (K-DUR,KLOR-CON)  CR tablet 40 mEq (40 mEq Oral Given 12/10/16 0921)  doxycycline (VIBRA-TABS) tablet 100 mg (100 mg Oral Given 12/10/16 0921)  dexamethasone (DECADRON) injection 10 mg (10 mg Intramuscular Given 12/10/16 7408)     Initial Impression / Assessment and Plan / ED Course  I have reviewed the triage vital signs and the nursing notes.  Pertinent labs & imaging results that were available during my care of the patient were reviewed by me and considered in my medical decision making (see chart for details).  Clinical Course as of Dec 11 926  Sun Dec 10, 2016  1448 Results from the ER workup discussed with the patient face to face and all questions answered to the best of my ability.  Pt felt better after the nebs here. Strict ER return precautions have been discussed, and patient is agreeing with the plan and is comfortable with the workup done and the recommendations from the ER.  Advised to see pcp in 1 week.  [AN]    Clinical Course User Index [AN] [name], MD    Pt with no hx of lung disease comes in with productive cough and shortness of breath. PT also has  substernal chest pain.  DDX:  PE ACS Thoracic Aortic Aneurysm Aortic dissection Pneumonia Bronchitis COPD  Malignancy of the lungs  Pt has no hx of PE. He is currently cancer free. WELLS score is 1 - for hemoptysis. Further chart reviewed showed CT PE in May of last year and a neg dimer last year. My suspicion for PE is extremely low, and I dont think we will be ordering Dimer right now. We will get CXR to assess for any bronchitis / masses / PNA. Pt had faint wheeze anteriorly, we will give him 1 duoneb here. VSS and WNL and there is no hypoxia.    Smoking cessation instruction/counseling given:  counseled patient on the dangers of tobacco use, advised patient to stop smoking, and reviewed strategies to maximize success. Discussed 2-3 minutes.   Clinically it seems like pt is having COPD related issues.   Final Clinical Impressions(s) / ED Diagnoses   Final diagnoses:  COPD with acute exacerbation (HCC)    New Prescriptions New Prescriptions   ALBUTEROL (PROVENTIL HFA;VENTOLIN HFA) 108 (90 BASE) MCG/ACT INHALER    Inhale 2 puffs into the lungs every 6 (six) hours as needed for wheezing or shortness of breath.   DOXYCYCLINE (VIBRAMYCIN) 100 MG CAPSULE    Take 1 capsule (100 mg total) by mouth 2 (two) times daily.   PREDNISONE (DELTASONE) 10 MG TABLET    Take 6 tablets (60 mg total) by mouth daily.     [name], MD 12/10/16 [phone]

## 2016-12-10 NOTE — Discharge Instructions (Signed)
We saw you in the ER for your breathing related complains. We gave you some breathing treatments in the ER, and seems like your symptoms have improved. Please take albuterol as needed every 4 hours. Please take the medications prescribed. Please refrain from smoking or smoke exposure. Please see a primary care doctor in 1 week. Return to the ER if your symptoms worsen.

## 2016-12-10 NOTE — ED Triage Notes (Signed)
Pt here with a cough x a little over a week.  Pt reports yellow sputum, blood tinged at times.  No fever with this.  Pt reports dry throat and pain in his right chest, pain increases with deep breathing.  Pt also reports some sob with this.  Pt is alert and oriented

## 2016-12-14 ENCOUNTER — Encounter: Payer: Self-pay | Admitting: Family

## 2016-12-14 ENCOUNTER — Ambulatory Visit (INDEPENDENT_AMBULATORY_CARE_PROVIDER_SITE_OTHER): Payer: Medicare Other | Admitting: Family

## 2016-12-14 DIAGNOSIS — J441 Chronic obstructive pulmonary disease with (acute) exacerbation: Secondary | ICD-10-CM | POA: Insufficient documentation

## 2016-12-14 NOTE — Assessment & Plan Note (Signed)
Symptoms remain consistent with improving COPD exacerbation with current medication regimen. Encouraged fluids and recommend starting Mucinex. Continue current dosage of doxycycline, prednisone and albuterol. Recommend follow up with pulmonology. Continue to monitor and follow up if symptoms worsen.

## 2016-12-14 NOTE — Progress Notes (Signed)
Subjective:    Patient ID: Andre [name], male    DOB: 12-Jun-1941, 76 y.o.   MRN: [id number]  Chief Complaint  Patient presents with  . Hospitalization Follow-up    feeling alot better from hospital visit, states he is still bringing up a lot of "fluid" from his chest    HPI:  Andre [name] is a 76 y.o. male who  has a past medical history of Arthritis; Coronary artery disease, non-occlusive; GERD (gastroesophageal reflux disease); HA (headache); Hypertension; 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[place]); TIA (transient ischemic attack); and Weakness. and presents today for a follow up office visit.   This is a new problem. Recently evaluated in the emergency department with the chief complaint of nonradiating, substernal and constant chest pain for 4 days prior to arrival. He was noted to have cough producing yellow sputum with occasional with blood tinge. Physical exam with no significant findings. EKG showed sinus rhythm with ventricular premature complex and nonspecific repolarization abnormality. Chest x-ray with stable mild cardiomegaly and probable COPD. He was treated with nebulizer treatments and felt improved. Started on doxycycline, albuterol, and prednisone. All emergency room records, labs and imaging reviewed in detail.  Since leaving the emergency department he reports improved symptoms with improving cough. Reports taking the doxycycline, prednisone, albuterol as prescribed with no adverse side effects. No current chest pain, fevers, or chills. Does continue to have a cough that he describes as productive. Denies wheezing or shortness of breath.   No Known Allergies    Outpatient Medications Prior to Visit  Medication Sig Dispense Refill  . albuterol (PROVENTIL HFA;VENTOLIN HFA) 108 (90 Base) MCG/ACT inhaler Inhale 2 puffs into the lungs every 6 (six) hours as needed for wheezing or shortness of breath. 1 Inhaler 2  . aspirin EC 81 MG tablet Take 81 mg by mouth daily.    Marland Kitchen doxycycline (VIBRAMYCIN)  100 MG capsule Take 1 capsule (100 mg total) by mouth 2 (two) times daily. 14 capsule 0  . nitroGLYCERIN (NITROSTAT) 0.4 MG SL tablet Place 1 tablet (0.4 mg total) under the tongue every 5 (five) minutes x 3 doses as needed for chest pain. 25 tablet 3  . nortriptyline (PAMELOR) 25 MG capsule Take 1 capsule (25 mg total) by mouth at bedtime. 90 capsule 4  . omeprazole (PRILOSEC) 20 MG capsule take 1 capsule by mouth once daily 30 capsule 2  . predniSONE (DELTASONE) 10 MG tablet Take 6 tablets (60 mg total) by mouth daily. 30 tablet 0  . valsartan-hydrochlorothiazide (DIOVAN-HCT) 160-25 MG tablet Take 1 tablet by mouth daily. (Patient not taking: Reported on 12/14/2016) 90 tablet 1   No facility-administered medications prior to visit.       Past Surgical History:  Procedure Laterality Date  . CARDIAC CATHETERIZATION  January 2013   Proximal RCA 30-40%. Otherwise normal.  . CARDIAC CATHETERIZATION N/A 10/18/2015   Procedure: Left Heart Cath and Coronary Angiography;  Surgeon: Jettie Booze, MD;  Location: [facility];  Service: Cardiovascular;  Laterality: N/A;  . NM MYOVIEW LTD  March 2015   LOW RISK. No scar or ischemia. Normal EF (55%). No RWMA  . surgical excision of rectal cancer        Past Medical History:  Diagnosis Date  . Arthritis   . Coronary artery disease, non-occlusive    a. 06/2011: cath for abnormal exercise echo: Only 30-40% proximal RCA. Otherwise normal. b. 10/2015: cath for abnormal NST and atypical CP --> showed mild 30-40% stenosis in the prox-distal  RCA  . GERD (gastroesophageal reflux disease)   . HA (headache)   . Hypertension   . 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[place])   . TIA (transient ischemic attack)   . Weakness       Review of Systems  Constitutional: Negative for chills and fever.  HENT: Positive for congestion. Negative for sinus pain, sinus pressure and sore throat.   Respiratory: Positive for cough. Negative for chest tightness, shortness of breath  and wheezing.   Cardiovascular: Negative for chest pain, palpitations and leg swelling.      Objective:    BP (!) 144/66 (BP Location: Left Arm, Patient Position: Sitting, Cuff Size: Large)   Pulse 65   Temp 98.7 F (37.1 C) (Oral)   Resp 16   Ht 5\' 11"  (1.803 m)   Wt 205 lb 1.9 oz (93 kg)   SpO2 97%   BMI 28.61 kg/m  Nursing note and vital signs reviewed.  Physical Exam  Constitutional: He is oriented to person, place, and time. He appears well-developed and well-nourished. No distress.  Cardiovascular: Normal rate, regular rhythm, normal heart sounds and intact distal pulses.  Exam reveals no gallop and no friction rub.   No murmur heard. Pulmonary/Chest: Effort normal and breath sounds normal. No respiratory distress. He has no wheezes. He has no rales. He exhibits no tenderness.  Neurological: He is alert and oriented to person, place, and time.  Skin: Skin is warm and dry.  Psychiatric: He has a normal mood and affect. His behavior is normal. Judgment and thought content normal.       Assessment & Plan:   Problem List Items Addressed This Visit      Respiratory   COPD exacerbation ([place])    Symptoms remain consistent with improving COPD exacerbation with current medication regimen. Encouraged fluids and recommend starting Mucinex. Continue current dosage of doxycycline, prednisone and albuterol. Recommend follow up with pulmonology. Continue to monitor and follow up if symptoms worsen.           I am having Mr. Jividen maintain his nitroGLYCERIN, aspirin EC, valsartan-hydrochlorothiazide, nortriptyline, omeprazole, doxycycline, albuterol, predniSONE, and metoprolol succinate.   Meds ordered this encounter  Medications  . metoprolol succinate (TOPROL-XL) 25 MG 24 hr tablet    Sig: Take 25 mg by mouth daily.     Follow-up: Return in about 3 months (around 03/16/2017), or if symptoms worsen or fail to improve.  Mauricio Po, FNP

## 2016-12-14 NOTE — Patient Instructions (Signed)
Thank you for choosing [facility].  SUMMARY AND INSTRUCTIONS:  Please continue to take your medications as prescribed.  Consider adding Mucinex (guaifenisen) as needed to help break up secretions.   Deep breathing multiple times throughout the day.   Follow up if symptoms return or worsen.  Follow up:  If your symptoms worsen or fail to improve, please contact our office for further instruction, or in case of emergency go directly to the emergency room at the closest medical facility.

## 2016-12-28 ENCOUNTER — Emergency Department (HOSPITAL_COMMUNITY)
Admission: EM | Admit: 2016-12-28 | Discharge: 2016-12-28 | Disposition: A | Discharge status: Home / Self Care | Payer: Medicare Other | Attending: Emergency Medicine | Admitting: Emergency Medicine

## 2016-12-28 ENCOUNTER — Encounter (HOSPITAL_COMMUNITY): Payer: Self-pay | Admitting: Emergency Medicine

## 2016-12-28 ENCOUNTER — Emergency Department (HOSPITAL_COMMUNITY): Payer: Medicare Other

## 2016-12-28 DIAGNOSIS — I2510 Atherosclerotic heart disease of native coronary artery without angina pectoris: Secondary | ICD-10-CM | POA: Insufficient documentation

## 2016-12-28 DIAGNOSIS — Z7982 Long term (current) use of aspirin: Secondary | ICD-10-CM | POA: Insufficient documentation

## 2016-12-28 DIAGNOSIS — Z85048 Personal history of other malignant neoplasm of rectum, rectosigmoid junction, and anus: Secondary | ICD-10-CM | POA: Insufficient documentation

## 2016-12-28 DIAGNOSIS — I119 Hypertensive heart disease without heart failure: Secondary | ICD-10-CM | POA: Diagnosis not present

## 2016-12-28 DIAGNOSIS — J449 Chronic obstructive pulmonary disease, unspecified: Secondary | ICD-10-CM | POA: Diagnosis not present

## 2016-12-28 DIAGNOSIS — Z8673 Personal history of transient ischemic attack (TIA), and cerebral infarction without residual deficits: Secondary | ICD-10-CM | POA: Insufficient documentation

## 2016-12-28 DIAGNOSIS — R0789 Other chest pain: Secondary | ICD-10-CM | POA: Insufficient documentation

## 2016-12-28 DIAGNOSIS — F17210 Nicotine dependence, cigarettes, uncomplicated: Secondary | ICD-10-CM | POA: Diagnosis not present

## 2016-12-28 DIAGNOSIS — J029 Acute pharyngitis, unspecified: Secondary | ICD-10-CM | POA: Insufficient documentation

## 2016-12-28 DIAGNOSIS — Z79899 Other long term (current) drug therapy: Secondary | ICD-10-CM | POA: Insufficient documentation

## 2016-12-28 DIAGNOSIS — R079 Chest pain, unspecified: Secondary | ICD-10-CM | POA: Diagnosis not present

## 2016-12-28 DIAGNOSIS — R070 Pain in throat: Secondary | ICD-10-CM | POA: Diagnosis not present

## 2016-12-28 LAB — CBC WITH DIFFERENTIAL/PLATELET
BASOS ABS: 0 10*3/uL (ref 0.0–0.1)
BASOS PCT: 0 %
Eosinophils Absolute: 0.2 10*3/uL (ref 0.0–0.7)
Eosinophils Relative: 3 %
HEMATOCRIT: 43.4 % (ref 39.0–52.0)
HEMOGLOBIN: 14.3 g/dL (ref 13.0–17.0)
LYMPHS PCT: 50 %
Lymphs Abs: 3 10*3/uL (ref 0.7–4.0)
MCH: 30.8 pg (ref 26.0–34.0)
MCHC: 32.9 g/dL (ref 30.0–36.0)
MCV: 93.3 fL (ref 78.0–100.0)
MONO ABS: 0.5 10*3/uL (ref 0.1–1.0)
Monocytes Relative: 8 %
NEUTROS ABS: 2.4 10*3/uL (ref 1.7–7.7)
NEUTROS PCT: 39 %
Platelets: 247 10*3/uL (ref 150–400)
RBC: 4.65 MIL/uL (ref 4.22–5.81)
RDW: 14.4 % (ref 11.5–15.5)
WBC: 6.1 10*3/uL (ref 4.0–10.5)

## 2016-12-28 LAB — BASIC METABOLIC PANEL
ANION GAP: 9 (ref 5–15)
BUN: <5 mg/dL — ABNORMAL LOW (ref 6–20)
CALCIUM: 8.8 mg/dL — AB (ref 8.9–10.3)
CHLORIDE: 105 mmol/L (ref 101–111)
CO2: 24 mmol/L (ref 22–32)
Creatinine, Ser: 0.74 mg/dL (ref 0.61–1.24)
GFR calc non Af Amer: >60 mL/min (ref >60)
GLUCOSE: 131 mg/dL — AB (ref 65–99)
POTASSIUM: 3.8 mmol/L (ref 3.5–5.1)
Sodium: 138 mmol/L (ref 135–145)

## 2016-12-28 LAB — TROPONIN I: Troponin I: <0.03 ng/mL (ref <0.03)

## 2016-12-28 LAB — RAPID STREP SCREEN (MED CTR MEBANE ONLY): STREPTOCOCCUS, GROUP A SCREEN (DIRECT): NEGATIVE

## 2016-12-28 LAB — D-DIMER, QUANTITATIVE (NOT AT ARMC): D DIMER QUANT: 0.5 ug{FEU}/mL (ref 0.00–0.50)

## 2016-12-28 MED ORDER — DEXAMETHASONE 4 MG PO TABS
10.0000 mg | ORAL_TABLET | Freq: Once | ORAL | Status: AC
  Administered 2016-12-28: 10 mg via ORAL
  Filled 2016-12-28: qty 3

## 2016-12-28 MED ORDER — VALSARTAN-HYDROCHLOROTHIAZIDE 160-25 MG PO TABS: 1.0000 | ORAL_TABLET | Freq: Once | ORAL | Status: DC

## 2016-12-28 MED ORDER — IPRATROPIUM-ALBUTEROL 0.5-2.5 (3) MG/3ML IN SOLN
3.0000 mL | Freq: Once | RESPIRATORY_TRACT | Status: AC
  Administered 2016-12-28: 3 mL via RESPIRATORY_TRACT
  Filled 2016-12-28: qty 3

## 2016-12-28 MED ORDER — DEXAMETHASONE SODIUM PHOSPHATE 10 MG/ML IJ SOLN
10.0000 mg | Freq: Once | INTRAMUSCULAR | Status: DC
  Filled 2016-12-28: qty 1

## 2016-12-28 MED ORDER — METOPROLOL SUCCINATE ER 25 MG PO TB24
25.0000 mg | ORAL_TABLET | Freq: Once | ORAL | Status: AC
  Administered 2016-12-28: 25 mg via ORAL
  Filled 2016-12-28: qty 1

## 2016-12-28 MED ORDER — IRBESARTAN 150 MG PO TABS
150.0000 mg | ORAL_TABLET | Freq: Once | ORAL | Status: AC
  Administered 2016-12-28: 150 mg via ORAL
  Filled 2016-12-28: qty 1

## 2016-12-28 MED ORDER — HYDROCHLOROTHIAZIDE 25 MG PO TABS
25.0000 mg | ORAL_TABLET | Freq: Once | ORAL | Status: AC
  Administered 2016-12-28: 25 mg via ORAL
  Filled 2016-12-28: qty 1

## 2016-12-28 MED ORDER — BENZONATATE 100 MG PO CAPS: 100.0000 mg | ORAL_CAPSULE | Freq: Three times a day (TID) | ORAL | 0 refills | Status: DC

## 2016-12-28 NOTE — ED Notes (Signed)
Dr. Rancour at bedside. 

## 2016-12-28 NOTE — ED Provider Notes (Signed)
[facility] DEPT Provider Note   CSN: [id number] Arrival date & time: 12/28/16  0509     History   Chief Complaint Chief Complaint  Patient presents with  . Sore Throat    HPI Andre [name] is a 76 y.o. male.  Patient with history of nonobstructive CAD, GERD, hypertension and rectal cancer in remission presenting with sore throat that woke him from sleep. States he felt well when he went to bed and woke up with a sore throat about 1:30 AM it is constant. He also complains of tightness in his chest that he noticed around the same time as well has been constant. Symptoms are similar to when he was seen in the ED on July 1 with a nonproductive cough and treated for bronchitis. He states he still has the cough and is having a purulent mucous with sometimes specks of blood. Denies fever. Denies shortness of breath. Denies any history of asthma or COPD. The chest pain is constant and does not radiate. No diaphoresis or nausea. No abdominal pain. No focal weakness, numbness or tingling.   The history is provided by the patient.  Sore Throat  Pertinent negatives include no chest pain, no abdominal pain, no headaches and no shortness of breath.    Past Medical History:  Diagnosis Date  . Arthritis   . Coronary artery disease, non-occlusive    a. 06/2011: cath for abnormal exercise echo: Only 30-40% proximal RCA. Otherwise normal. b. 10/2015: cath for abnormal NST and atypical CP --> showed mild 30-40% stenosis in the prox-distal RCA  . GERD (gastroesophageal reflux disease)   . HA (headache)   . Hypertension   . 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[place])   . TIA (transient ischemic attack)   . Weakness     Patient Active Problem List   Diagnosis Date Noted  . COPD exacerbation ([place]) 12/14/2016  . Cough 10/16/2016  . Acute upper respiratory infection 06/20/2016  . Chest pain 03/19/2016  . Back pain 02/29/2016  . Abnormal stress test 10/18/2015  . Chest pain with moderate risk for cardiac etiology  09/30/2015  . Erectile dysfunction 02/25/2015  . Cigarette smoker 02/10/2015  . COPD GOLD 0 11/08/2014  . Upper airway cough syndrome 08/10/2014  . Hemorrhoids 02/11/2014  . Constipation 05/30/2013  . 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 Headache 10/23/2011  . 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[place]) 10/23/2011  . GERD (gastroesophageal reflux disease) 10/23/2011  . Nicotine addiction 10/23/2011  . 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 Cervical stenosis of spinal canal 10/23/2011  . Essential hypertension, benign 07/10/2011    Past Surgical History:  Procedure Laterality Date  . CARDIAC CATHETERIZATION  January 2013   Proximal RCA 30-40%. Otherwise normal.  . CARDIAC CATHETERIZATION N/A 10/18/2015   Procedure: Left Heart Cath and Coronary Angiography;  Surgeon: Jettie Booze, MD;  Location: [facility];  Service: Cardiovascular;  Laterality: N/A;  . NM MYOVIEW LTD  March 2015   LOW RISK. No scar or ischemia. Normal EF (55%). No RWMA  . surgical excision of rectal cancer         Home Medications    Prior to Admission medications   Medication Sig Start Date End Date Taking? Authorizing Provider  albuterol (PROVENTIL HFA;VENTOLIN HFA) 108 (90 Base) MCG/ACT inhaler Inhale 2 puffs into the lungs every 6 (six) hours as needed for wheezing or shortness of breath. 12/10/16   [name], MD  aspirin EC 81 MG tablet Take 81 mg by mouth daily.  [provider]  doxycycline (VIBRAMYCIN) 100 MG capsule Take 1 capsule (100 mg total) by mouth 2 (two) times daily. 12/10/16   [name], MD  metoprolol succinate (TOPROL-XL) 25 MG 24 hr tablet Take 25 mg by mouth daily.    [provider]  nitroGLYCERIN (NITROSTAT) 0.4 MG SL tablet Place 1 tablet (0.4 mg total) under the tongue every 5 (five) minutes x 3 doses as needed for chest pain. 10/18/15   Strader, Fransisco Hertz, PA-C  nortriptyline (PAMELOR) 25 MG capsule Take 1 capsule (25 mg total) by mouth at bedtime. 11/22/16   Marcial Pacas, MD  omeprazole (PRILOSEC) 20 MG capsule take 1 capsule by mouth once daily 12/08/16   Golden Circle, FNP  predniSONE (DELTASONE) 10 MG tablet Take 6 tablets (60 mg total) by mouth daily. 12/11/16   [name], MD  valsartan-hydrochlorothiazide (DIOVAN-HCT) 160-25 MG tablet Take 1 tablet by mouth daily. Patient not taking: Reported on 12/14/2016 08/30/16   Golden Circle, FNP    Family History Family History  Problem Relation Age of Onset  . Hypertension Mother   . Cancer Mother   . Liver disease Father     Social History Social History  Substance Use Topics  . Smoking status: Current Some Day Smoker    Packs/day: 0.30    Years: 47.00    Types: Cigarettes  . Smokeless tobacco: Never Used  . Alcohol use 1.2 oz/week    2 Cans of beer per week     Allergies   Patient has no known allergies.   Review of Systems Review of Systems  Constitutional: Negative for activity change, appetite change and fever.  HENT: Positive for sore throat. Negative for rhinorrhea.   Respiratory: Positive for cough and chest tightness. Negative for shortness of breath.   Cardiovascular: Negative for chest pain.  Gastrointestinal: Negative for abdominal pain and nausea.  Genitourinary: Negative for dysuria and hematuria.  Musculoskeletal: Negative for arthralgias, back pain and myalgias.  Neurological: Negative for dizziness, weakness, light-headedness and headaches.   all other systems are negative except as noted in the HPI and PMH. \    Physical Exam Updated Vital Signs BP (!) 176/97   Pulse 67   Temp 97.6 F (36.4 C) (Oral)   Resp (!) 24   Ht 5\' 11"  (1.803 m)   Wt 93 kg (205 lb)   SpO2 98%   BMI 28.59 kg/m   Physical Exam  Constitutional: He is oriented to person, place, and time. He appears well-developed and well-nourished. No distress.  HENT:  Head: Normocephalic and atraumatic.  Mouth/Throat: Oropharynx is clear and moist. No oropharyngeal exudate.  Uvula  midline, mildly enlarged.  No asymmetry or exudates  Eyes: Pupils are equal, round, and reactive to light. Conjunctivae and EOM are normal.  Neck: Normal range of motion. Neck supple.  No meningismus.  Cardiovascular: Normal rate, regular rhythm, normal heart sounds and intact distal pulses.   No murmur heard. Pulmonary/Chest: Effort normal and breath sounds normal. No respiratory distress. He has no wheezes.  Abdominal: Soft. There is no tenderness. There is no rebound and no guarding.  Musculoskeletal: Normal range of motion. He exhibits no edema or tenderness.  Neurological: He is alert and oriented to person, place, and time. No cranial nerve deficit. He exhibits normal muscle tone. Coordination normal.  No ataxia on finger to nose bilaterally. No pronator drift. 5/5 strength throughout. CN 2-12 intact.Equal grip strength. Sensation intact.   Skin: Skin is warm.  Psychiatric: He has a normal mood and affect. His behavior is normal.  Nursing note and vitals reviewed.    ED Treatments / Results  Labs (all labs ordered are listed, but only abnormal results are displayed) Labs Reviewed  BASIC METABOLIC PANEL - Abnormal; Notable for the following:       Result Value   Glucose, Bld 131 (*)    BUN <5 (*)    Calcium 8.8 (*)    All other components within normal limits  RAPID STREP SCREEN (NOT AT [facility])  CULTURE, GROUP A STREP [facility])  CBC WITH DIFFERENTIAL/PLATELET  TROPONIN I  D-DIMER, QUANTITATIVE (NOT AT [facility])  TROPONIN I    EKG  EKG Interpretation  Date/Time:  Thursday December 28 2016 05:56:19 EDT Ventricular Rate:  69 PR Interval:    QRS Duration: 104 QT Interval:  462 QTC Calculation: 495 R Axis:   -11 Text Interpretation:  Sinus rhythm Borderline prolonged QT interval No significant change was found Confirmed by Ezequiel Essex [id number]) on 12/28/2016 6:23:13 AM       Radiology Dg Neck Soft Tissue  Result Date: 12/28/2016 CLINICAL DATA:  76 year old male with  right-sided chest pain and sore throat. EXAM: NECK SOFT TISSUES - 1+ VIEW COMPARISON:  Radiograph dated 11/03/2015 FINDINGS: The airways are patent. The epiglottis is within normal limits. No significant adenoidal or tonsillar prominence. The retropharyngeal soft tissues appear unremarkable. There is no acute fracture of the visualized cervical spine. There multilevel degenerative changes with anterior osteophyte at C4-C7 with mild associated mass effect on the posterior aspect of the proximal esophagus as seen on the prior radiograph. IMPRESSION: 1. No acute findings. 2. Degenerative changes of the cervical spine with mild mass effect and indentation of the posterior aspect of the cervical esophagus. Electronically Signed   By: Anner Crete M.D.   On: 12/28/2016 06:39   Dg Chest 2 View  Result Date: 12/28/2016 CLINICAL DATA:  76 year old male with right-sided chest pain. EXAM: CHEST  2 VIEW COMPARISON:  Chest radiograph dated 12/10/2016 FINDINGS: Minimal left lung base atelectatic changes/scarring similar to prior radiograph and CT of 10/20/2015. There is no focal consolidation, pleural effusion, or pneumothorax. Stable mild cardiomegaly. No acute osseous pathology. IMPRESSION: 1. No acute cardiopulmonary process. 2. Stable left lung base atelectasis/scarring. 3. Stable mild cardiomegaly. Electronically Signed   By: Anner Crete M.D.   On: 12/28/2016 06:36    Procedures Procedures (including critical care time)  Medications Ordered in ED Medications - No data to display   Initial Impression / Assessment and Plan / ED Course  I have reviewed the triage vital signs and the nursing notes.  Pertinent labs & imaging results that were available during my care of the patient were reviewed by me and considered in my medical decision making (see chart for details).    Sore throat and chest pain that woke from sleep at 130am.  Recently treated for COPD exacerbation and finished antibiotics and  steroids.   EKG nsr.  Nonobstructive disease by cath last year. Troponin negative. D-dimer negative. CXR clear.   Decadron given for wheezing as well as questionable mild uvular edema. No stridor. Floor of mouth soft.   Improved with nebs in the ED. Plan second troponin at 9am.  Low suspicion for ACS. Dr. Leonette Monarch to assume care.  Final Clinical Impressions(s) / ED Diagnoses   Final diagnoses:  Pharyngitis, unspecified etiology  Atypical chest pain    New Prescriptions New Prescriptions   No medications on file  Ezequiel Essex, MD 12/28/16 [phone]

## 2016-12-28 NOTE — ED Notes (Signed)
Pts clothing changed. Pt had urinated on himself.

## 2016-12-28 NOTE — ED Notes (Signed)
EDP at bedside  

## 2016-12-28 NOTE — ED Notes (Signed)
Dr. Cardama at bedside.  

## 2016-12-28 NOTE — ED Triage Notes (Signed)
Reports sore throat since Tuesday.

## 2016-12-28 NOTE — ED Provider Notes (Signed)
I assumed care of this patient from Dr. Wyvonnia Dusky at 0800.  Please see their note for further details of Hx, PE.  Briefly patient is a 76 y.o. male who presents with several days of sore throat and posttussive chest pain. Chest x-ray without evidence of pneumonia. Plain film of the neck without evidence to suggest epiglottitis or RPA. Labs grossly reassuring without leukocytosis. D-dimer negative. EKG without acute ischemic changes or evidence of pericarditis. Initial troponin negative. Current plan is to obtain a delta troponin. If negative patient is felt to be safe for discharge.  Delta troponin negative.  The patient is safe for discharge with strict return precautions.  Disposition: Discharge  Condition: Good  I have discussed the results, Dx and Tx plan with the patient who expressed understanding and agree(s) with the plan. Discharge instructions discussed at great length. The patient was given strict return precautions who verbalized understanding of the instructions. No further questions at time of discharge.    New Prescriptions   BENZONATATE (TESSALON) 100 MG CAPSULE    Take 1 capsule (100 mg total) by mouth every 8 (eight) hours.    Follow Up: Golden Circle, FNP [place] [address] [phone]  Schedule an appointment as soon as possible for a visit in 2 days         Cardama, Grayce Sessions, MD 12/28/16 1050

## 2016-12-30 LAB — CULTURE, GROUP A STREP (THRC)

## 2017-01-02 ENCOUNTER — Telehealth: Payer: Self-pay | Admitting: Family

## 2017-01-02 NOTE — Telephone Encounter (Signed)
Pt has an appt for 7/25 to see Terri Piedra, FNP for hospital follow-up

## 2017-01-02 NOTE — Telephone Encounter (Signed)
[facility]  Patient Name: Andre [name]  DOB: 02-24-1941    Initial Comment caller states he has been having yellow mucous coming up from chest , Chest pain , and headaches and sore throat . He is calling to get an appointment    Nurse Assessment  Nurse: Harlow Mares, RN, Suanne Marker Date/Time (Eastern Time): 01/02/2017 8:28:52 AM  Confirm and document reason for call. If symptomatic, describe symptoms. ---caller states he has been having yellow mucous coming up from chest , Chest pain , and headaches and sore throat . Was placed on abx last Monday in ED and is still taking them. Reports some SOB with cough. Denies fever.  Does the patient have any new or worsening symptoms? ---Yes  Will a triage be completed? ---Yes  Related visit to physician within the last 2 weeks? ---Yes  Does the PT have any chronic conditions? (i.e. diabetes, asthma, etc.) ---Yes  List chronic conditions. ---HTN;  Is this a behavioral health or substance abuse call? ---No     Guidelines    Guideline Title Affirmed Question Affirmed Notes  Cough - Acute Productive [1] Coughed up blood AND [2] > 1 tablespoon (15 ml) (Exception: blood-tinged sputum)    Final Disposition User   See Physician within 4 Hours (or PCP triage) Harlow Mares, RN, Rh[place]    Comments  Caller hung up prior to visit being scheduled. No answer on return call to number listed. Advised to call back for appt.   Referrals  REFERRED TO PCP OFFICE   Disagree/Comply: Comply

## 2017-01-03 ENCOUNTER — Inpatient Hospital Stay: Payer: Medicare Other | Admitting: Family

## 2017-01-04 ENCOUNTER — Ambulatory Visit (INDEPENDENT_AMBULATORY_CARE_PROVIDER_SITE_OTHER): Payer: Medicare Other | Admitting: Family

## 2017-01-04 ENCOUNTER — Encounter: Payer: Self-pay | Admitting: Family

## 2017-01-04 VITALS — BP 144/88 | HR 63 | Temp 98.3°F | Resp 16 | Ht 71.0 in | Wt 201.0 lb

## 2017-01-04 DIAGNOSIS — J0110 Acute frontal sinusitis, unspecified: Secondary | ICD-10-CM

## 2017-01-04 MED ORDER — AMOXICILLIN-POT CLAVULANATE 875-125 MG PO TABS: 1.0000 | ORAL_TABLET | Freq: Two times a day (BID) | ORAL | 0 refills | Status: DC

## 2017-01-04 NOTE — Progress Notes (Signed)
Subjective:    Patient ID: Andre [name], male    DOB: Jul 21, 1940, 76 y.o.   MRN: [id number]  Chief Complaint  Patient presents with  . Hospitalization Follow-up    still having sore throat, having soreness where he has been bringing up a lot of yellow mucus    HPI:  Andre [name] is a 76 y.o. male who  has a past medical history of Arthritis; Coronary artery disease, non-occlusive; GERD (gastroesophageal reflux disease); HA (headache); Hypertension; 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[place]); TIA (transient ischemic attack); and Weakness. and presents today for a follow up office visit.  Recently evaluated in the emergency department with several days of sore throat and and cough. X-rays without evidence of pneumonia, epiglottitis, or RPA. CBC was negative with no leukocytosis and d-dimer was negative. He recently completed a course of doxycycline, prednisone, and albuterol. Presents with continued symptoms of sore throat and a productive cough with a lot of yellow mucus. Severity is causing his throat to become raw. Some shortness of breath or no wheezing. Reports taking the benzotate as prescribed and notes mild improvement with the cough. Was using Mucinex but has run out of it.  No fevers. Symptoms are generally getting worse.   No Known Allergies    Outpatient Medications Prior to Visit  Medication Sig Dispense Refill  . albuterol (PROVENTIL HFA;VENTOLIN HFA) 108 (90 Base) MCG/ACT inhaler Inhale 2 puffs into the lungs every 6 (six) hours as needed for wheezing or shortness of breath. 1 Inhaler 2  . aspirin EC 81 MG tablet Take 81 mg by mouth daily.    . benzonatate (TESSALON) 100 MG capsule Take 1 capsule (100 mg total) by mouth every 8 (eight) hours. 21 capsule 0  . metoprolol succinate (TOPROL-XL) 25 MG 24 hr tablet Take 25 mg by mouth daily.    . nitroGLYCERIN (NITROSTAT) 0.4 MG SL tablet Place 1 tablet (0.4 mg total) under the tongue every 5 (five) minutes x 3 doses as needed for chest pain. 25 tablet 3    . nortriptyline (PAMELOR) 25 MG capsule Take 1 capsule (25 mg total) by mouth at bedtime. 90 capsule 4  . omeprazole (PRILOSEC) 20 MG capsule take 1 capsule by mouth once daily 30 capsule 2  . predniSONE (DELTASONE) 10 MG tablet Take 6 tablets (60 mg total) by mouth daily. 30 tablet 0  . valsartan-hydrochlorothiazide (DIOVAN-HCT) 160-25 MG tablet Take 1 tablet by mouth daily. 90 tablet 1  . doxycycline (VIBRAMYCIN) 100 MG capsule Take 1 capsule (100 mg total) by mouth 2 (two) times daily. 14 capsule 0   No facility-administered medications prior to visit.      Review of Systems  Constitutional: Negative for chills and fever.  HENT: Positive for congestion, sinus pressure and sore throat. Negative for ear pain, trouble swallowing and voice change.   Respiratory: Positive for cough and shortness of breath. Negative for wheezing.   Neurological: Positive for headaches.      Objective:    BP (!) 144/88 (BP Location: Left Arm, Patient Position: Sitting, Cuff Size: Large)   Pulse 63   Temp 98.3 F (36.8 C) (Oral)   Resp 16   Ht 5\' 11"  (1.803 m)   Wt 201 lb (91.2 kg)   SpO2 96%   BMI 28.03 kg/m  Nursing note and vital signs reviewed.  Physical Exam  Constitutional: He is oriented to person, place, and time. He appears well-developed and well-nourished. No distress.  HENT:  Right Ear: Hearing, tympanic membrane, external ear  and ear canal normal.  Left Ear: Hearing, tympanic membrane, external ear and ear canal normal.  Nose: Right sinus exhibits frontal sinus tenderness. Left sinus exhibits frontal sinus tenderness.  Mouth/Throat: Oropharynx is clear and moist and mucous membranes are normal.  Cardiovascular: Normal rate, regular rhythm, normal heart sounds and intact distal pulses.   Pulmonary/Chest: Effort normal and breath sounds normal.  Neurological: He is alert and oriented to person, place, and time.  Skin: Skin is warm and dry.  Psychiatric: He has a normal mood and  affect. His behavior is normal. Judgment and thought content normal.       Assessment & Plan:   Problem List Items Addressed This Visit      Respiratory   Acute non-recurrent frontal sinusitis - Primary    Symptoms and exam consistent with sinusitis although cannot rule out underlying COPD exacerbation. Start Augmentin. Continue Mucinex and over-the-counter medications as needed for symptom relief and supportive care. Follow-up if symptoms worsen or do not improve.      Relevant Medications   amoxicillin-clavulanate (AUGMENTIN) 875-125 MG tablet       I have discontinued Mr. Morina's doxycycline. I am also having him start on amoxicillin-clavulanate. Additionally, I am having him maintain his nitroGLYCERIN, aspirin EC, valsartan-hydrochlorothiazide, nortriptyline, omeprazole, albuterol, predniSONE, metoprolol succinate, and benzonatate.   Meds ordered this encounter  Medications  . amoxicillin-clavulanate (AUGMENTIN) 875-125 MG tablet    Sig: Take 1 tablet by mouth 2 (two) times daily.    Dispense:  14 tablet    Refill:  0    Order Specific Question:   Supervising Provider    Answer:   Pricilla Holm A [[id number]]     Follow-up: Return if symptoms worsen or fail to improve.  Mauricio Po, FNP

## 2017-01-04 NOTE — Patient Instructions (Signed)
Thank you for choosing [facility].  SUMMARY AND INSTRUCTIONS:  Please start taking the Augmentin.  Continue with OTC medications like Mucinex.   Follow up if symptoms worsen.   Medication:  Your prescription(s) have been submitted to your pharmacy or been printed and provided for you. Please take as directed and contact our office if you believe you are having problem(s) with the medication(s) or have any questions.  Follow up:  If your symptoms worsen or fail to improve, please contact our office for further instruction, or in case of emergency go directly to the emergency room at the closest medical facility.    General Recommendations:    Please drink plenty of fluids.  Get plenty of rest   Sleep in humidified air  Use saline nasal sprays  Netti pot   OTC Medications:  Decongestants - helps relieve congestion   Flonase (generic fluticasone) or Nasacort (generic triamcinolone) - please make sure to use the "cross-over" technique at a 45 degree angle towards the opposite eye as opposed to straight up the nasal passageway.   Sudafed (generic pseudoephedrine - Note this is the one that is available behind the pharmacy counter); Products with phenylephrine (-PE) may also be used but is often not as effective as pseudoephedrine.   If you have HIGH BLOOD PRESSURE - Coricidin HBP; AVOID any product that is -D as this contains pseudoephedrine which may increase your blood pressure.  Afrin (oxymetazoline) every 6-8 hours for up to 3 days.   Allergies - helps relieve runny nose, itchy eyes and sneezing   Claritin (generic loratidine), Allegra (fexofenidine), or Zyrtec (generic cyrterizine) for runny nose. These medications should not cause drowsiness.  Note - Benadryl (generic diphenhydramine) may be used however may cause drowsiness  Cough -   Delsym or Robitussin (generic dextromethorphan)  Expectorants - helps loosen mucus to ease removal   Mucinex  (generic guaifenesin) as directed on the package.  Headaches / General Aches   Tylenol (generic acetaminophen) - DO NOT EXCEED 3 grams (3,000 mg) in a 24 hour time period  Advil/Motrin (generic ibuprofen)   Sore Throat -   Salt water gargle   Chloraseptic (generic benzocaine) spray or lozenges / Sucrets (generic dyclonine)

## 2017-01-04 NOTE — Assessment & Plan Note (Signed)
Symptoms and exam consistent with sinusitis although cannot rule out underlying COPD exacerbation. Start Augmentin. Continue Mucinex and over-the-counter medications as needed for symptom relief and supportive care. Follow-up if symptoms worsen or do not improve.

## 2017-01-25 ENCOUNTER — Other Ambulatory Visit: Payer: Self-pay | Admitting: Student

## 2017-01-25 ENCOUNTER — Ambulatory Visit (INDEPENDENT_AMBULATORY_CARE_PROVIDER_SITE_OTHER): Payer: Medicare Other | Admitting: Family

## 2017-01-25 ENCOUNTER — Encounter: Payer: Self-pay | Admitting: Family

## 2017-01-25 ENCOUNTER — Ambulatory Visit (INDEPENDENT_AMBULATORY_CARE_PROVIDER_SITE_OTHER)
Admission: RE | Admit: 2017-01-25 | Discharge: 2017-01-25 | Disposition: A | Discharge status: Home / Self Care | Payer: Medicare Other | Source: Ambulatory Visit | Attending: Family | Admitting: Family

## 2017-01-25 VITALS — BP 150/74 | HR 80 | Temp 98.5°F | Resp 16 | Ht 71.0 in | Wt 202.8 lb

## 2017-01-25 DIAGNOSIS — R05 Cough: Secondary | ICD-10-CM | POA: Diagnosis not present

## 2017-01-25 DIAGNOSIS — R059 Cough, unspecified: Secondary | ICD-10-CM

## 2017-01-25 NOTE — Assessment & Plan Note (Signed)
Symptoms of continued cough with question of COPD or exacerbation of GERD. Has completed 2 courses of antibiotics. Does not appear to be bacterial infection. Start sample of Anoro for COPD and increase omeprazole for 1 week. Obtain x-ray. Refer to pulmonology if symptoms worsen or do not improve.

## 2017-01-25 NOTE — Patient Instructions (Addendum)
Thank you for choosing [facility].  SUMMARY AND INSTRUCTIONS:  Trial Anoro - 1 inhalation daily.   Mucinex to help break up congestion as needed.  Also may try increasing your omeprazole to 40 mg (2 tablets) daily for 5 days.   We will check your x-ray today.  For your wrist - Ice x 20 minutes every 2 hours   Ibuprofen 200-400 mg every 4-6 hours as needed for discomfort and inflammation.  A referral to pulmonology has been placed - may cancel if getting better.  Medication:  Your prescription(s) have been submitted to your pharmacy or been printed and provided for you. Please take as directed and contact our office if you believe you are having problem(s) with the medication(s) or have any questions.  Imaging / Radiology:  Please stop by radiology on the basement level of the building for your x-rays. Your results will be released to [facility] (or called to you) after review, usually within 72 hours after test completion. If any treatments or changes are necessary, you will be notified at that same time.  Referrals:  Referrals have been made during this visit. You should expect to hear back from our schedulers in about 7-10 days in regards to establishing an appointment with the specialists we discussed.   Follow up:  If your symptoms worsen or fail to improve, please contact our office for further instruction, or in case of emergency go directly to the emergency room at the closest medical facility.

## 2017-01-25 NOTE — Progress Notes (Signed)
Subjective:    Patient ID: Andre [name], male    DOB: [birth year], 76 y.o.   MRN: [id number]  Chief Complaint  Patient presents with  . Chest congestion    having chest congestion and body soreness    HPI:  Andre [name] is a 76 y.o. male who  has a past medical history of Arthritis; Coronary artery disease, non-occlusive; GERD (gastroesophageal reflux disease); HA (headache); Hypertension; 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[place]); TIA (transient ischemic attack); and Weakness. and presents today for a follow up office visit.   Continues to experience associated symptoms of chest congestion and body soreness has been going on for approximately 1-1/2 months and refractory to previously prescribed Augmentin and doxycycline. Previous checks x-ray showed COPD with no evidence of pneumonia. Describes the productive cough with yellow sputum production. Describes generalized body aches. No fevers. Occasional shortness of breath with no wheezing. Unsure of anything that makes it better or worse.   No Known Allergies    Outpatient Medications Prior to Visit  Medication Sig Dispense Refill  . albuterol (PROVENTIL HFA;VENTOLIN HFA) 108 (90 Base) MCG/ACT inhaler Inhale 2 puffs into the lungs every 6 (six) hours as needed for wheezing or shortness of breath. 1 Inhaler 2  . aspirin EC 81 MG tablet Take 81 mg by mouth daily.    . benzonatate (TESSALON) 100 MG capsule Take 1 capsule (100 mg total) by mouth every 8 (eight) hours. 21 capsule 0  . metoprolol succinate (TOPROL-XL) 25 MG 24 hr tablet Take 25 mg by mouth daily.    . nitroGLYCERIN (NITROSTAT) 0.4 MG SL tablet Place 1 tablet (0.4 mg total) under the tongue every 5 (five) minutes x 3 doses as needed for chest pain. 25 tablet 3  . nortriptyline (PAMELOR) 25 MG capsule Take 1 capsule (25 mg total) by mouth at bedtime. 90 capsule 4  . omeprazole (PRILOSEC) 20 MG capsule take 1 capsule by mouth once daily 30 capsule 2  . predniSONE (DELTASONE) 10 MG tablet Take 6 tablets  (60 mg total) by mouth daily. 30 tablet 0  . valsartan-hydrochlorothiazide (DIOVAN-HCT) 160-25 MG tablet Take 1 tablet by mouth daily. 90 tablet 1  . amoxicillin-clavulanate (AUGMENTIN) 875-125 MG tablet Take 1 tablet by mouth 2 (two) times daily. 14 tablet 0   No facility-administered medications prior to visit.     Past Medical History:  Diagnosis Date  . Arthritis   . Coronary artery disease, non-occlusive    a. 06/2011: cath for abnormal exercise echo: Only 30-40% proximal RCA. Otherwise normal. b. 10/2015: cath for abnormal NST and atypical CP --> showed mild 30-40% stenosis in the prox-distal RCA  . GERD (gastroesophageal reflux disease)   . HA (headache)   . Hypertension   . 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[place])   . TIA (transient ischemic attack)   . Weakness     Review of Systems  Constitutional: Negative for chills and fever.  HENT: Positive for congestion. Negative for sore throat.   Respiratory: Positive for cough and shortness of breath. Negative for chest tightness.   Cardiovascular: Negative for chest pain, palpitations and leg swelling.      Objective:    BP (!) 150/74 (BP Location: Left Arm, Patient Position: Sitting, Cuff Size: Large)   Pulse 80   Temp 98.5 F (36.9 C) (Oral)   Resp 16   Ht 5\' 11"  (1.803 m)   Wt 202 lb 12.8 oz (92 kg)   SpO2 96%   BMI 28.28 kg/m  Nursing note and vital signs  reviewed.  Physical Exam  Constitutional: He is oriented to person, place, and time. He appears well-developed and well-nourished. No distress.  HENT:  Right Ear: Hearing, tympanic membrane, external ear and ear canal normal.  Left Ear: Hearing, tympanic membrane, external ear and ear canal normal.  Nose: Nose normal.  Mouth/Throat: Uvula is midline, oropharynx is clear and moist and mucous membranes are normal.  Cardiovascular: Normal rate, regular rhythm, normal heart sounds and intact distal pulses.  Exam reveals no gallop and no friction rub.   No murmur  heard. Pulmonary/Chest: Effort normal and breath sounds normal. No respiratory distress. He has no wheezes. He has no rales. He exhibits no tenderness.  Neurological: He is alert and oriented to person, place, and time.  Skin: Skin is warm and dry.  Psychiatric: He has a normal mood and affect. His behavior is normal. Judgment and thought content normal.       Assessment & Plan:   Problem List Items Addressed This Visit      Other   Cough - Primary    Symptoms of continued cough with question of COPD or exacerbation of GERD. Has completed 2 courses of antibiotics. Does not appear to be bacterial infection. Start sample of Anoro for COPD and increase omeprazole for 1 week. Obtain x-ray. Refer to pulmonology if symptoms worsen or do not improve.       Relevant Orders   DG Chest 2 View       I have discontinued Mr. Mikrut's amoxicillin-clavulanate. I am also having him maintain his nitroGLYCERIN, aspirin EC, valsartan-hydrochlorothiazide, nortriptyline, omeprazole, albuterol, predniSONE, metoprolol succinate, and benzonatate.   Follow-up: Return if symptoms worsen or fail to improve.  Mauricio Po, FNP

## 2017-01-26 ENCOUNTER — Encounter: Payer: Self-pay | Admitting: Family

## 2017-02-06 ENCOUNTER — Other Ambulatory Visit: Payer: Self-pay | Admitting: Family

## 2017-02-06 DIAGNOSIS — I10 Essential (primary) hypertension: Secondary | ICD-10-CM

## 2017-02-14 ENCOUNTER — Encounter: Payer: Self-pay | Admitting: Family

## 2017-02-14 ENCOUNTER — Ambulatory Visit (INDEPENDENT_AMBULATORY_CARE_PROVIDER_SITE_OTHER): Payer: Medicare Other | Admitting: Family

## 2017-02-14 VITALS — BP 118/72 | HR 79 | Temp 98.1°F | Resp 16 | Ht 71.0 in | Wt 200.8 lb

## 2017-02-14 DIAGNOSIS — M2560 Stiffness of unspecified joint, not elsewhere classified: Secondary | ICD-10-CM

## 2017-02-14 MED ORDER — PREDNISONE 5 MG (21) PO TBPK: ORAL_TABLET | ORAL | 0 refills | Status: DC

## 2017-02-14 NOTE — Progress Notes (Signed)
Subjective:    Patient ID: Andre [name], male    DOB: January 11, 1941, 76 y.o.   MRN: [id number]  Chief Complaint  Patient presents with  . Body soreness    having body soreness all over and thinks it is a side effect of the nortriptyline, the sorness got worse within the last 2 weeks    HPI:  Andre [name] is a 76 y.o. male who  has a past medical history of Arthritis; Coronary artery disease, non-occlusive; GERD (gastroesophageal reflux disease); HA (headache); Hypertension; 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[place]); TIA (transient ischemic attack); and Weakness. and presents today for an acute office visit.  This is a new problem. Associated symptoms of generalized body soreness has been going on for the last 2 weeks. Primarily located in his bilateral wrists, left shoulder and left side of back. Thinks it possibly is related to a side effect of his nortriptyline. No fevers, chills, or other cold/flu like symptoms. Pain is described as a generalized ache. No trauma or injury that he is aware of. Urinating okay with no symptoms or changes in color. Modifying factors include Tylenol which helps with the headaches but does not help very much for the other pains.   No Known Allergies    Outpatient Medications Prior to Visit  Medication Sig Dispense Refill  . albuterol (PROVENTIL HFA;VENTOLIN HFA) 108 (90 Base) MCG/ACT inhaler Inhale 2 puffs into the lungs every 6 (six) hours as needed for wheezing or shortness of breath. 1 Inhaler 2  . aspirin EC 81 MG tablet Take 81 mg by mouth daily.    . metoprolol succinate (TOPROL-XL) 25 MG 24 hr tablet take 1 tablet by mouth once daily 90 tablet 3  . nitroGLYCERIN (NITROSTAT) 0.4 MG SL tablet Place 1 tablet (0.4 mg total) under the tongue every 5 (five) minutes x 3 doses as needed for chest pain. 25 tablet 0  . nortriptyline (PAMELOR) 25 MG capsule Take 1 capsule (25 mg total) by mouth at bedtime. 90 capsule 4  . omeprazole (PRILOSEC) 20 MG capsule take 1 capsule by mouth once  daily 30 capsule 2  . valsartan-hydrochlorothiazide (DIOVAN-HCT) 160-25 MG tablet Take 1 tablet by mouth daily. 90 tablet 1  . predniSONE (DELTASONE) 10 MG tablet Take 6 tablets (60 mg total) by mouth daily. 30 tablet 0  . benzonatate (TESSALON) 100 MG capsule Take 1 capsule (100 mg total) by mouth every 8 (eight) hours. 21 capsule 0  . metoprolol succinate (TOPROL-XL) 25 MG 24 hr tablet Take 25 mg by mouth daily.     No facility-administered medications prior to visit.      Past Medical History:  Diagnosis Date  . Arthritis   . Coronary artery disease, non-occlusive    a. 06/2011: cath for abnormal exercise echo: Only 30-40% proximal RCA. Otherwise normal. b. 10/2015: cath for abnormal NST and atypical CP --> showed mild 30-40% stenosis in the prox-distal RCA  . GERD (gastroesophageal reflux disease)   . HA (headache)   . Hypertension   . 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[place])   . TIA (transient ischemic attack)   . Weakness       Review of Systems  Constitutional: Negative for chills, fatigue and fever.  HENT: Negative for congestion, postnasal drip, rhinorrhea, sinus pain, sinus pressure, sneezing and sore throat.   Respiratory: Negative for chest tightness and shortness of breath.   Cardiovascular: Negative for chest pain, palpitations and leg swelling.  Musculoskeletal:       Positive for generalized body aches.  Neurological: Negative for weakness, numbness and headaches.      Objective:    BP 118/72 (BP Location: Left Arm, Patient Position: Sitting, Cuff Size: Large)   Pulse 79   Temp 98.1 F (36.7 C) (Oral)   Resp 16   Ht 5\' 11"  (1.803 m)   Wt 200 lb 12.8 oz (91.1 kg)   SpO2 95%   BMI 28.01 kg/m  Nursing note and vital signs reviewed.  Physical Exam  Constitutional: He is oriented to person, place, and time. He appears well-developed and well-nourished. No distress.  HENT:  Right Ear: Hearing, tympanic membrane, external ear and ear canal normal.  Left Ear: Hearing,  tympanic membrane, external ear and ear canal normal.  Nose: Nose normal.  Mouth/Throat: Uvula is midline, oropharynx is clear and moist and mucous membranes are normal.  Cardiovascular: Normal rate, regular rhythm, normal heart sounds and intact distal pulses.   Pulmonary/Chest: Effort normal and breath sounds normal.  Neurological: He is alert and oriented to person, place, and time.  Skin: Skin is warm and dry.  Psychiatric: He has a normal mood and affect. His behavior is normal. Judgment and thought content normal.       Assessment & Plan:   Problem List Items Addressed This Visit      Other   Generalized stiffness - Primary    Generalized stiffnessand body aches of undetermined origin  Although does not appear to be infectious. Endorses that he does not move around much. No significant findings on physical exam. Encouraged continued movement and will start low dose prednisone taper to help with inflammation. Continue to monitor and follow up if symptoms worsen or do not improve.           I have discontinued Mr. Kozakiewicz's predniSONE and benzonatate. I am also having him start on predniSONE. Additionally, I am having him maintain his aspirin EC, valsartan-hydrochlorothiazide, nortriptyline, omeprazole, albuterol, nitroGLYCERIN, and metoprolol succinate.   Meds ordered this encounter  Medications  . predniSONE (STERAPRED UNI-PAK 21 TAB) 5 MG (21) TBPK tablet    Sig: Take 6 tablets x 1 day, 5 tablets x 1 day, 4 tablets x 1 day, 3 tablets x 1 day, 2 tablets x 1 day, 1 tablet x 1 day    Dispense:  21 tablet    Refill:  0    Order Specific Question:   Supervising Provider    Answer:   Pricilla Holm A [[id number]]     Follow-up: Return if symptoms worsen or fail to improve.  Mauricio Po, FNP

## 2017-02-14 NOTE — Patient Instructions (Signed)
Thank you for choosing [facility].  SUMMARY AND INSTRUCTIONS:  Your symptoms appear to be related to stiffness and would recommend increasing physical activity.  Start the prednisone taper.   Ibuprofen, Aleve or Tylenol as needed for discomfort.  Follow up for worsening or if not improving.   Medication:  Your prescription(s) have been submitted to your pharmacy or been printed and provided for you. Please take as directed and contact our office if you believe you are having problem(s) with the medication(s) or have any questions.  Follow up:  If your symptoms worsen or fail to improve, please contact our office for further instruction, or in case of emergency go directly to the emergency room at the closest medical facility.

## 2017-02-14 NOTE — Assessment & Plan Note (Signed)
Generalized stiffnessand body aches of undetermined origin  Although does not appear to be infectious. Endorses that he does not move around much. No significant findings on physical exam. Encouraged continued movement and will start low dose prednisone taper to help with inflammation. Continue to monitor and follow up if symptoms worsen or do not improve.

## 2017-03-09 ENCOUNTER — Emergency Department (HOSPITAL_COMMUNITY)
Admission: EM | Admit: 2017-03-09 | Discharge: 2017-03-09 | Disposition: A | Discharge status: Home / Self Care | Payer: Medicare Other | Attending: Emergency Medicine | Admitting: Emergency Medicine

## 2017-03-09 ENCOUNTER — Encounter (HOSPITAL_COMMUNITY): Payer: Self-pay

## 2017-03-09 ENCOUNTER — Other Ambulatory Visit: Payer: Self-pay

## 2017-03-09 ENCOUNTER — Emergency Department (HOSPITAL_COMMUNITY): Payer: Medicare Other

## 2017-03-09 DIAGNOSIS — K219 Gastro-esophageal reflux disease without esophagitis: Secondary | ICD-10-CM

## 2017-03-09 DIAGNOSIS — R079 Chest pain, unspecified: Secondary | ICD-10-CM | POA: Diagnosis not present

## 2017-03-09 DIAGNOSIS — Z8673 Personal history of transient ischemic attack (TIA), and cerebral infarction without residual deficits: Secondary | ICD-10-CM | POA: Diagnosis not present

## 2017-03-09 DIAGNOSIS — I2510 Atherosclerotic heart disease of native coronary artery without angina pectoris: Secondary | ICD-10-CM | POA: Diagnosis not present

## 2017-03-09 DIAGNOSIS — F17210 Nicotine dependence, cigarettes, uncomplicated: Secondary | ICD-10-CM | POA: Insufficient documentation

## 2017-03-09 DIAGNOSIS — R071 Chest pain on breathing: Secondary | ICD-10-CM | POA: Diagnosis not present

## 2017-03-09 DIAGNOSIS — I10 Essential (primary) hypertension: Secondary | ICD-10-CM | POA: Insufficient documentation

## 2017-03-09 DIAGNOSIS — Z7982 Long term (current) use of aspirin: Secondary | ICD-10-CM | POA: Diagnosis not present

## 2017-03-09 DIAGNOSIS — R0602 Shortness of breath: Secondary | ICD-10-CM | POA: Diagnosis not present

## 2017-03-09 DIAGNOSIS — Z79899 Other long term (current) drug therapy: Secondary | ICD-10-CM | POA: Insufficient documentation

## 2017-03-09 LAB — BASIC METABOLIC PANEL
ANION GAP: 8 (ref 5–15)
BUN: 5 mg/dL — ABNORMAL LOW (ref 6–20)
CHLORIDE: 106 mmol/L (ref 101–111)
CO2: 24 mmol/L (ref 22–32)
CREATININE: 0.71 mg/dL (ref 0.61–1.24)
Calcium: 8.7 mg/dL — ABNORMAL LOW (ref 8.9–10.3)
GFR calc Af Amer: >60 mL/min (ref >60)
GFR calc non Af Amer: >60 mL/min (ref >60)
GLUCOSE: 114 mg/dL — AB (ref 65–99)
Potassium: 3.4 mmol/L — ABNORMAL LOW (ref 3.5–5.1)
Sodium: 138 mmol/L (ref 135–145)

## 2017-03-09 LAB — CBC
HCT: 40.9 % (ref 39.0–52.0)
HEMOGLOBIN: 13.5 g/dL (ref 13.0–17.0)
MCH: 30.8 pg (ref 26.0–34.0)
MCHC: 33 g/dL (ref 30.0–36.0)
MCV: 93.2 fL (ref 78.0–100.0)
Platelets: 186 10*3/uL (ref 150–400)
RBC: 4.39 MIL/uL (ref 4.22–5.81)
RDW: 15.2 % (ref 11.5–15.5)
WBC: 6.2 10*3/uL (ref 4.0–10.5)

## 2017-03-09 LAB — I-STAT TROPONIN, ED
Troponin i, poc: 0.02 ng/mL (ref 0.00–0.08)
Troponin i, poc: 0 ng/mL (ref 0.00–0.08)

## 2017-03-09 MED ORDER — GI COCKTAIL ~~LOC~~
30.0000 mL | Freq: Once | ORAL | Status: AC
  Administered 2017-03-09: 30 mL via ORAL
  Filled 2017-03-09: qty 30

## 2017-03-09 MED ORDER — POTASSIUM CHLORIDE CRYS ER 20 MEQ PO TBCR
40.0000 meq | EXTENDED_RELEASE_TABLET | Freq: Once | ORAL | Status: AC
  Administered 2017-03-09: 40 meq via ORAL
  Filled 2017-03-09: qty 2

## 2017-03-09 MED ORDER — OMEPRAZOLE 20 MG PO CPDR
20.0000 mg | DELAYED_RELEASE_CAPSULE | Freq: Every day | ORAL | 0 refills | Status: DC
Start: 2017-03-09 — End: 2017-03-12

## 2017-03-09 NOTE — Discharge Instructions (Signed)
Your workup today showed no evidence of acute cardiac or lung issues thus far. Please start taking Omeprazole once daily in the morning 30 minutes to an hour before any food or drink. Please schedule an appointment to follow-up with your primary care provider early next week.   Get help right away if: Your chest pain is worse. You have a cough that gets worse, or you cough up blood. You have very bad (severe) pain in your belly (abdomen). You are very weak. You pass out (faint). You have either of these for no clear reason: Sudden chest discomfort. Sudden discomfort in your arms, back, neck, or jaw. You have shortness of breath at any time. You suddenly start to sweat, or your skin gets clammy. You feel sick to your stomach (nauseous). You throw up (vomit). You suddenly feel light-headed or dizzy. Your heart starts to beat fast, or it feels like it is skipping beats.

## 2017-03-09 NOTE — ED Provider Notes (Signed)
Medical screening examination/treatment/procedure(s) were conducted as a shared visit with non-physician practitioner(s) and myself.  I personally evaluated the patient during the encounter.   EKG Interpretation  Date/Time:  Friday March 09 2017 05:07:45 EDT Ventricular Rate:  54 PR Interval:  220 QRS Duration: 92 QT Interval:  458 QTC Calculation: 434 R Axis:   -8 Text Interpretation:  Sinus bradycardia with 1st degree A-V block with Premature atrial complexes Nonspecific ST and T wave abnormality , consider inferior ischemia Abnormal ECG Confirmed by Orpah Greek [phone]) on 03/09/2017 5:17:45 AM Also confirmed by Orpah Greek [phone]), editor Hattie Perch ([id number])  on 03/09/2017 6:57:67 AM      76 year old male who presents with chest pain.he has a history of nonocclusive coronary artery disease and hypertension. Reports 3 days of constant burning in the retrosternal and left-sided region of his chest. Pain is primarily been constant, worse when he lies down to sleep at night, and sometimes after eating. Does not seem associated with exertion or activity. No associated diaphoresis, vomiting, nausea, abdominal pain, diarrhea, melena or hematochezia. Does feel mildly short of breath with this.  On arrival, is nontoxic in no acute distress. Exam nonfocal. Vital signs non-concerning. EKG showing mild ST depression inferior lateral leads, but this is unchanged from previous EKG in January 2018. Troponin is normal. Chest x-ray visualized shows no acute cardiopulmonary processes. Symptoms seem atypical for ACS given that its worse after eating, laying flat, and being constant for 3 days without significant exertional component.Suggestive of possible GI etiology. Given a GI cocktail, with resolution of his symptoms. Low suspicion for PE or dissection at this time.  Records are reviewed, and he has had a left heart catheterization in 10/28/2015 showing nonobstructive coronary  artery disease. He has higher risk for ACS, and I did discuss potential observation admission for serial troponins. Patient at this time declines admission. States that he feels significantly improved after GI cocktail. Prefers to be discharged home. Expresses understanding that he is not fully ruled out for ACS, and that he has higher risk for potential major cardiac events. He states that he will return for any worsening or recurrent symptoms.  I did obtain serial EKG and troponin, which are unchanged and shows no acute ischemic changes. Strict return and follow-up instructions reviewed. He expressed understanding of all discharge instructions and felt comfortable with the plan of care.    Forde Dandy, MD 03/09/17 [phone]

## 2017-03-09 NOTE — ED Triage Notes (Signed)
Pt here for left chest pain, and sob, sts on left side has pain, ongoing x 2 days, reports worse on deep breath and has yellow sputum he is spitting

## 2017-03-09 NOTE — ED Provider Notes (Signed)
[facility] DEPT Provider Note   CSN: [id number] Arrival date & time: 03/09/17  3382     History   Chief Complaint Chief Complaint  Patient presents with  . Chest Pain    HPI  Andre [name] is a 76 y.o. male with a history of CAD, hypertension, GERD and rectal cancer, who presents today complaining of burning midsternal and left-sided chest pain that's been going on for the past 2 days. He reports he first dose of chest pain Wednesday night when he was getting into bed at about 10 PM, the pain is burning and constant, sometimes worse with a deep breath and has been affecting his sleep. Patient reports this pain is also worse after eating. He isn't takes the Tylenol most mornings, but has not taken anything in edition for this pain. Pain does not seem to be exertional or positional, and does not radiate anywhere. Patient reports occasional shortness of breath, some cough and has been coughing up yellow sputum for the past week. Patient denies any fevers or chills. Denies any dizziness or lightheadedness, has occasional headaches but this is not new. Denies any weakness or numbness in the extremities. No abdominal pain nausea or vomiting. Pt reports he took his BP medicine and aspirin before coming to the hospital today. Patient had a heart cath on 10/28/15 that showed stable nonobstructive coronary artery disease. Pt is a current smoker and endorses 1-2 drinks daily, no other substance use.  Patient also complains of some left lower back pain for the past week, that radiates down the left leg, patient is able to walk without any issues, no numbness or tingling no loss of bowel or bladder control. Pt reports he sits with his wallet in that back pocket.       Past Medical History:  Diagnosis Date  . Arthritis   . Coronary artery disease, non-occlusive    a. 06/2011: cath for abnormal exercise echo: Only 30-40% proximal RCA. Otherwise normal. b. 10/2015: cath for abnormal NST and atypical CP  --> showed mild 30-40% stenosis in the prox-distal RCA  . GERD (gastroesophageal reflux disease)   . HA (headache)   . Hypertension   . 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[place])   . TIA (transient ischemic attack)   . Weakness     Patient Active Problem List   Diagnosis Date Noted  . Generalized stiffness 02/14/2017  . Acute non-recurrent frontal sinusitis 01/04/2017  . COPD exacerbation ([place]) 12/14/2016  . Cough 10/16/2016  . Acute upper respiratory infection 06/20/2016  . Chest pain 03/19/2016  . Back pain 02/29/2016  . Abnormal stress test 10/18/2015  . Chest pain with moderate risk for cardiac etiology 09/30/2015  . Erectile dysfunction 02/25/2015  . Cigarette smoker 02/10/2015  . COPD GOLD 0 11/08/2014  . Upper airway cough syndrome 08/10/2014  . Hemorrhoids 02/11/2014  . Constipation 05/30/2013  . 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 Headache 10/23/2011  . 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[place]) 10/23/2011  . GERD (gastroesophageal reflux disease) 10/23/2011  . Nicotine addiction 10/23/2011  . 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 Cervical stenosis of spinal canal 10/23/2011  . Essential hypertension, benign 07/10/2011    Past Surgical History:  Procedure Laterality Date  . CARDIAC CATHETERIZATION  January 2013   Proximal RCA 30-40%. Otherwise normal.  . CARDIAC CATHETERIZATION N/A 10/18/2015   Procedure: Left Heart Cath and Coronary Angiography;  Surgeon: Jettie Booze, MD;  Location: [facility];  Service: Cardiovascular;  Laterality: N/A;  . NM MYOVIEW LTD  March 2015   LOW RISK. No scar or ischemia. Normal EF (55%). No RWMA  . surgical excision of rectal cancer         Home Medications    Prior to Admission medications   Medication Sig Start Date End Date Taking? Authorizing Provider  albuterol (PROVENTIL HFA;VENTOLIN HFA) 108 (90 Base) MCG/ACT inhaler Inhale 2 puffs into the lungs every 6 (six) hours as needed for wheezing or shortness of breath. 12/10/16   [name], MD    aspirin EC 81 MG tablet Take 81 mg by mouth daily.    [provider]  metoprolol succinate (TOPROL-XL) 25 MG 24 hr tablet take 1 tablet by mouth once daily 02/06/17   Golden Circle, FNP  nitroGLYCERIN (NITROSTAT) 0.4 MG SL tablet Place 1 tablet (0.4 mg total) under the tongue every 5 (five) minutes x 3 doses as needed for chest pain. 01/26/17   Leonie Man, MD  nortriptyline (PAMELOR) 25 MG capsule Take 1 capsule (25 mg total) by mouth at bedtime. 11/22/16   Marcial Pacas, MD  omeprazole (PRILOSEC) 20 MG capsule take 1 capsule by mouth once daily 12/08/16   Golden Circle, FNP  predniSONE (STERAPRED UNI-PAK 21 TAB) 5 MG (21) TBPK tablet Take 6 tablets x 1 day, 5 tablets x 1 day, 4 tablets x 1 day, 3 tablets x 1 day, 2 tablets x 1 day, 1 tablet x 1 day 02/14/17   Golden Circle, FNP  valsartan-hydrochlorothiazide (DIOVAN-HCT) 160-25 MG tablet Take 1 tablet by mouth daily. 08/30/16   Golden Circle, FNP    Family History Family History  Problem Relation Age of Onset  . Hypertension Mother   . Cancer Mother   . Liver disease Father     Social History Social History  Substance Use Topics  . Smoking status: Current Some Day Smoker    Packs/day: 0.30    Years: 47.00    Types: Cigarettes  . Smokeless tobacco: Never Used  . Alcohol use 1.2 oz/week    2 Cans of beer per week     Allergies   Patient has no known allergies.   Review of Systems Review of Systems  Constitutional: Negative for chills and fever.  HENT: Negative for congestion, rhinorrhea and sore throat.   Eyes: Negative for photophobia and visual disturbance.  Respiratory: Positive for cough and shortness of breath. Negative for chest tightness.   Cardiovascular: Positive for chest pain. Negative for palpitations and leg swelling.  Gastrointestinal: Negative for abdominal pain, blood in stool, nausea and vomiting.  Genitourinary: Negative for difficulty urinating and dysuria.  Musculoskeletal:  Positive for back pain.  Skin: Negative for pallor and rash.  Neurological: Positive for headaches. Negative for dizziness, weakness, light-headedness and numbness.     Physical Exam Updated Vital Signs BP (!) 154/89 (BP Location: Right Arm)   Pulse (!) 52   Temp 97.6 F (36.4 C) (Oral)   Resp 16   SpO2 98%   Physical Exam  Constitutional: He appears well-developed and well-nourished. No distress.  HENT:  Head: Normocephalic and atraumatic.  Eyes: Pupils are equal, round, and reactive to light. EOM are normal. Right eye exhibits no discharge. Left eye exhibits no discharge.  Neck: Neck supple. No JVD present. No tracheal deviation present.  Cardiovascular: Normal rate, regular rhythm, normal heart sounds and intact distal pulses.   Pulmonary/Chest: Effort normal and breath sounds normal. No respiratory distress. He has no wheezes. He has no rales. He exhibits no  tenderness.  Abdominal: Soft. Bowel sounds are normal. He exhibits no distension and no mass. There is no tenderness. There is no guarding.  Musculoskeletal: He exhibits no edema or deformity.  Neurological: He is alert. Coordination normal.  Speech is clear, able to follow commands CN III-XII intact Normal strength in upper and lower extremities bilaterally including dorsiflexion and plantar flexion, strong and equal grip strength Sensation normal to light and sharp touch Moves extremities without ataxia, coordination intact  Skin: Skin is warm and dry. Capillary refill takes less than 2 seconds. He is not diaphoretic.  Psychiatric: He has a normal mood and affect. His behavior is normal.  Nursing note and vitals reviewed.    ED Treatments / Results  Labs (all labs ordered are listed, but only abnormal results are displayed) Labs Reviewed  BASIC METABOLIC PANEL - Abnormal; Notable for the following:       Result Value   Potassium 3.4 (*)    Glucose, Bld 114 (*)    BUN 5 (*)    Calcium 8.7 (*)    All other  components within normal limits  CBC  I-STAT TROPONIN, ED  I-STAT TROPONIN, ED    EKG  EKG Interpretation  Date/Time:  Friday March 09 2017 05:07:45 EDT Ventricular Rate:  54 PR Interval:  220 QRS Duration: 92 QT Interval:  458 QTC Calculation: 434 R Axis:   -8 Text Interpretation:  Sinus bradycardia with 1st degree A-V block with Premature atrial complexes Nonspecific ST and T wave abnormality , consider inferior ischemia Abnormal ECG Confirmed by Orpah Greek [phone]) on 03/09/2017 5:17:45 AM Also confirmed by Orpah Greek [phone]), editor Hattie Perch [phone])  on 03/09/2017 6:57:23 AM       Radiology Dg Chest 2 View  Result Date: 03/09/2017 CLINICAL DATA:  76 y/o  M; chest pain and shortness of breath. EXAM: CHEST  2 VIEW COMPARISON:  01/25/2017 chest radiograph FINDINGS: Stable cardiomegaly. Aortic atherosclerosis with calcification. Clear lungs. No pleural effusion or pneumothorax. No acute osseous abnormality is evident. IMPRESSION: No acute pulmonary process identified.  Stable cardiomegaly. Electronically Signed   By: Kristine Garbe M.D.   On: 03/09/2017 06:28    Procedures Procedures (including critical care time)  Medications Ordered in ED Medications  gi cocktail (Maalox,Lidocaine,Donnatal) (30 mLs Oral Given 03/09/17 0821)  potassium chloride SA (K-DUR,KLOR-CON) CR tablet 40 mEq (40 mEq Oral Given 03/09/17 1023)     Initial Impression / Assessment and Plan / ED Course  I have reviewed the triage vital signs and the nursing notes.  Pertinent labs & imaging results that were available during my care of the patient were reviewed by me and considered in my medical decision making (see chart for details).  Pt presents with burning retrosternal and left sided CP. NAD, vitals not concerning. Exam is unremarkable. EKG shows mild inferior ST depressions but this is unchanged from previous EKGs, Troponin negative, CXR is negative for  active cardiopulmonary disease. Symptoms are not classic of ACS, especially w/ association w/ food, but given age and risk factors pt does have a higher risk if ACS. Pt has hx of GERD and has stopped taking his Omeprazole. GI cocktail given with resolution of symptoms. Low suspicion for PE or dissection.    Repeat troponin is negative, repeat EKG is unchanged. Myself and Dr. Oleta Mouse both discussed the patient coming in for serial troponin levels given his age and general risk for cardiac disease, the patient reports that he would prefer to go  home. Pt discharged home with prescription for omeprazole, pt will follow up with PCP, strict return precautions were given and patient expressed understanding.  Patient discussed with Dr. Brantley Stage, who saw patient as well and agrees with plan.   Final Clinical Impressions(s) / ED Diagnoses   Final diagnoses:  Chest pain, unspecified type  Gastroesophageal reflux disease, esophagitis presence not specified    New Prescriptions Discharge Medication List as of 03/09/2017 11:46 AM    START taking these medications   Details  !! omeprazole (PRILOSEC) 20 MG capsule Take 1 capsule (20 mg total) by mouth daily. Take medication in the morning, 30 minutes - 1 hour before eating or drinking., Starting Fri 03/09/2017, Print     !! - Potential duplicate medications found. Please discuss with provider.       Jacqlyn Larsen, PA-C 03/09/17 1749    Forde Dandy, MD 03/15/17 [phone]

## 2017-03-09 NOTE — ED Notes (Signed)
abd and chest pain since yesterday  He drove himself here no acute  distresss

## 2017-03-11 NOTE — Progress Notes (Signed)
Subjective:    Patient ID: Andre [name], male    DOB: 03-31-1941, 76 y.o.   MRN: [id number]  HPI The patient is here for follow up from the hospital.  He went to the ED 03/09/17 for chest pain.  He has known CAD and hypertension.  He had 3 days of constant burning retrosternal and left sided chest pain.  The pain was worse with sleeping and sometimes after eating.  The pain was not associated with activity.  He did have some mild SOB.  He denies associated sweating, nausea/vomiting, nausea, abdominal pain. His EKG showed mild ST depression in the inferior leads, which was the same as prior EKGs.  troponins normal.  CXR was normal.  It was thought it was GI related.  He received a GI cocktail and his symptoms resolved.  He was prescribed omeprazole 20 mg daily, which he is already taking.  He was on a prednisone from 9/5 - 9/11 last month.    He was taking the omeprazole prior to going into the hospital.  In the past 6 months he was having GERD symptoms almost daily.  He is not compliant with a GERD diet.  He continues to have chest pain that is burning in nature.   One month ago he started having whole body soreness.  The is moving throughout his body.  He rides a bike for exercise.  He does have increased pain in his left lower back and that radiates down his left leg - he has not had this evaluated previously.  Hypertension: He is taking his medication daily. He is compliant with a low sodium diet.   He is exercising some.  He does not monitor his blood pressure at home, but states his BP is usually controlled.    Diabetes:  He has had prior elevated a1c's - he was not aware he had diabetes.  Will check a1c.  He is not compliant with a diabetic diet.     Medications and allergies reviewed with patient and updated if appropriate.  Patient Active Problem List   Diagnosis Date Noted  . Generalized stiffness 02/14/2017  . Acute non-recurrent frontal sinusitis 01/04/2017  . COPD exacerbation  ([place]) 12/14/2016  . Cough 10/16/2016  . Acute upper respiratory infection 06/20/2016  . Chest pain 03/19/2016  . Back pain 02/29/2016  . Abnormal stress test 10/18/2015  . Chest pain with moderate risk for cardiac etiology 09/30/2015  . Erectile dysfunction 02/25/2015  . Cigarette smoker 02/10/2015  . COPD GOLD 0 11/08/2014  . Upper airway cough syndrome 08/10/2014  . Hemorrhoids 02/11/2014  . Constipation 05/30/2013  . 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 Headache 10/23/2011  . 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[place]) 10/23/2011  . GERD (gastroesophageal reflux disease) 10/23/2011  . Nicotine addiction 10/23/2011  . 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 Cervical stenosis of spinal canal 10/23/2011  . Essential hypertension, benign 07/10/2011    Current Outpatient Prescriptions on File Prior to Visit  Medication Sig Dispense Refill  . acetaminophen (TYLENOL) 325 MG tablet Take 325-650 mg by mouth every 6 (six) hours as needed for mild pain or headache.    . albuterol (PROVENTIL HFA;VENTOLIN HFA) 108 (90 Base) MCG/ACT inhaler Inhale 2 puffs into the lungs every 6 (six) hours as needed for wheezing or shortness of breath. 1 Inhaler 2  . aspirin EC 81 MG tablet Take 81 mg by mouth daily.    . metoprolol succinate (TOPROL-XL) 25 MG 24 hr tablet take 1 tablet  by mouth once daily 90 tablet 3  . nitroGLYCERIN (NITROSTAT) 0.4 MG SL tablet Place 1 tablet (0.4 mg total) under the tongue every 5 (five) minutes x 3 doses as needed for chest pain. 25 tablet 0  . nortriptyline (PAMELOR) 25 MG capsule Take 1 capsule (25 mg total) by mouth at bedtime. 90 capsule 4  . valsartan-hydrochlorothiazide (DIOVAN-HCT) 160-25 MG tablet Take 1 tablet by mouth daily. 90 tablet 1  . [DISCONTINUED] chlorthalidone (HYGROTON) 25 MG tablet Take 25 mg by mouth daily.     No current facility-administered medications on file prior to visit.     Past Medical History:  Diagnosis Date  . Arthritis   . Coronary artery disease,  non-occlusive    a. 06/2011: cath for abnormal exercise echo: Only 30-40% proximal RCA. Otherwise normal. b. 10/2015: cath for abnormal NST and atypical CP --> showed mild 30-40% stenosis in the prox-distal RCA  . GERD (gastroesophageal reflux disease)   . HA (headache)   . Hypertension   . 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[place])   . TIA (transient ischemic attack)   . Weakness     Past Surgical History:  Procedure Laterality Date  . CARDIAC CATHETERIZATION  January 2013   Proximal RCA 30-40%. Otherwise normal.  . CARDIAC CATHETERIZATION N/A 10/18/2015   Procedure: Left Heart Cath and Coronary Angiography;  Surgeon: Jettie Booze, MD;  Location: [facility];  Service: Cardiovascular;  Laterality: N/A;  . NM MYOVIEW LTD  March 2015   LOW RISK. No scar or ischemia. Normal EF (55%). No RWMA  . surgical excision of rectal cancer      Social History   Social History  . Marital status: Single    Spouse name: N/A  . Number of children: 11  . Years of education: 69   Occupational History  . Retried     retired   Social History Main Topics  . Smoking status: Current Some Day Smoker    Packs/day: 0.30    Years: 47.00    Types: Cigarettes  . Smokeless tobacco: Never Used  . Alcohol use 1.2 oz/week    2 Cans of beer per week  . Drug use: No  . Sexual activity: Not Asked   Other Topics Concern  . None   Social History Narrative   Patient is single, retired   Patient is right handed   Education level is high school   Caffeine consumption is 1 cup daily    Family History  Problem Relation Age of Onset  . Hypertension Mother   . Cancer Mother   . Liver disease Father     Review of Systems  Constitutional: Negative for chills and fever.  Respiratory: Positive for shortness of breath (little with exertion). Negative for cough and wheezing.   Cardiovascular: Positive for chest pain (after he eats). Negative for palpitations and leg swelling.  Gastrointestinal: Negative for  abdominal pain, blood in stool, constipation, diarrhea and nausea.       Gerd  Endocrine: Negative for polydipsia and polyuria.  Neurological: Positive for weakness (all over), numbness (occasional) and headaches. Negative for light-headedness.       Objective:   Vitals:   03/12/17 1307  BP: (!) 178/86  Pulse: 63  Resp: 16  Temp: 98.1 F (36.7 C)  SpO2: 98%   Wt Readings from Last 3 Encounters:  03/12/17 207 lb (93.9 kg)  02/14/17 200 lb 12.8 oz (91.1 kg)  01/25/17 202 lb 12.8 oz (92 kg)  Body mass index is 28.87 kg/m.   Physical Exam    Constitutional: Appears well-developed and well-nourished. No distress.  HENT:  Head: Normocephalic and atraumatic.  Neck: Neck supple. No tracheal deviation present. No thyromegaly present.  No cervical lymphadenopathy Cardiovascular: Normal rate, regular rhythm and normal heart sounds.   No murmur heard. No carotid bruit .  No edema Pulmonary/Chest: Effort normal and breath sounds normal. No respiratory distress. No has no wheezes. No rales.  Skin: Skin is warm and dry. Not diaphoretic.  Psychiatric: Normal mood and affect. Behavior is normal.      Assessment & Plan:    See Problem List for Assessment and Plan of chronic medical problems.

## 2017-03-12 ENCOUNTER — Ambulatory Visit (INDEPENDENT_AMBULATORY_CARE_PROVIDER_SITE_OTHER): Payer: Medicare Other | Admitting: Internal Medicine

## 2017-03-12 ENCOUNTER — Encounter: Payer: Self-pay | Admitting: Internal Medicine

## 2017-03-12 ENCOUNTER — Other Ambulatory Visit (INDEPENDENT_AMBULATORY_CARE_PROVIDER_SITE_OTHER): Payer: Medicare Other

## 2017-03-12 VITALS — BP 178/86 | HR 63 | Temp 98.1°F | Resp 16 | Wt 207.0 lb

## 2017-03-12 DIAGNOSIS — E119 Type 2 diabetes mellitus without complications: Secondary | ICD-10-CM

## 2017-03-12 DIAGNOSIS — K219 Gastro-esophageal reflux disease without esophagitis: Secondary | ICD-10-CM

## 2017-03-12 DIAGNOSIS — M545 Low back pain, unspecified: Secondary | ICD-10-CM

## 2017-03-12 DIAGNOSIS — R079 Chest pain, unspecified: Secondary | ICD-10-CM | POA: Diagnosis not present

## 2017-03-12 DIAGNOSIS — I10 Essential (primary) hypertension: Secondary | ICD-10-CM

## 2017-03-12 DIAGNOSIS — M79605 Pain in left leg: Secondary | ICD-10-CM | POA: Diagnosis not present

## 2017-03-12 LAB — COMPREHENSIVE METABOLIC PANEL
ALBUMIN: 3.9 g/dL (ref 3.5–5.2)
ALT: 37 U/L (ref 0–53)
AST: 53 U/L — ABNORMAL HIGH (ref 0–37)
Alkaline Phosphatase: 71 U/L (ref 39–117)
BUN: 4 mg/dL — ABNORMAL LOW (ref 6–23)
CALCIUM: 9 mg/dL (ref 8.4–10.5)
CHLORIDE: 103 meq/L (ref 96–112)
CO2: 26 meq/L (ref 19–32)
Creatinine, Ser: 0.77 mg/dL (ref 0.40–1.50)
GFR: 126.59 mL/min (ref >60.00)
Glucose, Bld: 109 mg/dL — ABNORMAL HIGH (ref 70–99)
POTASSIUM: 3.5 meq/L (ref 3.5–5.1)
Sodium: 139 mEq/L (ref 135–145)
Total Bilirubin: 0.7 mg/dL (ref 0.2–1.2)
Total Protein: 6.8 g/dL (ref 6.0–8.3)

## 2017-03-12 LAB — CBC WITH DIFFERENTIAL/PLATELET
BASOS PCT: 0.7 % (ref 0.0–3.0)
Basophils Absolute: 0 10*3/uL (ref 0.0–0.1)
EOS PCT: 2.7 % (ref 0.0–5.0)
Eosinophils Absolute: 0.2 10*3/uL (ref 0.0–0.7)
HEMATOCRIT: 45.1 % (ref 39.0–52.0)
Hemoglobin: 15 g/dL (ref 13.0–17.0)
LYMPHS PCT: 48.9 % — AB (ref 12.0–46.0)
Lymphs Abs: 3.2 10*3/uL (ref 0.7–4.0)
MCHC: 33.2 g/dL (ref 30.0–36.0)
MCV: 96.2 fl (ref 78.0–100.0)
MONOS PCT: 10.8 % (ref 3.0–12.0)
Monocytes Absolute: 0.7 10*3/uL (ref 0.1–1.0)
NEUTROS ABS: 2.4 10*3/uL (ref 1.4–7.7)
Neutrophils Relative %: 36.9 % — ABNORMAL LOW (ref 43.0–77.0)
Platelets: 228 10*3/uL (ref 150.0–400.0)
RBC: 4.68 Mil/uL (ref 4.22–5.81)
RDW: 15.9 % — AB (ref 11.5–15.5)
WBC: 6.5 10*3/uL (ref 4.0–10.5)

## 2017-03-12 LAB — HEMOGLOBIN A1C: Hgb A1c MFr Bld: 8.1 % — ABNORMAL HIGH (ref 4.6–6.5)

## 2017-03-12 MED ORDER — LOSARTAN POTASSIUM-HCTZ 100-25 MG PO TABS: 1.0000 | ORAL_TABLET | Freq: Every day | ORAL | 3 refills | Status: DC

## 2017-03-12 MED ORDER — PANTOPRAZOLE SODIUM 40 MG PO TBEC: 40.0000 mg | DELAYED_RELEASE_TABLET | Freq: Every day | ORAL | 1 refills | Status: DC

## 2017-03-12 NOTE — Patient Instructions (Addendum)
Test(s) ordered today. Your results will be released to [place] (or called to you) after review, usually within 72hours after test completion. If any changes need to be made, you will be notified at that same time.  All other Health Maintenance issues reviewed.   All recommended immunizations and age-appropriate screenings are up-to-date or discussed.  No immunizations administered today.   Medications reviewed and updated.  Changes include stop the omeprazole and start pantoprazole 40 mg daily.   We will also change your blood pressure medication from valsartan-hctz to losartan-hctz.  Your prescription(s) have been submitted to your pharmacy. Please take as directed and contact our office if you believe you are having problem(s) with the medication(s).  Revise your diet.       Heartburn Heartburn is a type of pain or discomfort that can happen in the throat or chest. It is often described as a burning pain. It may also cause a bad taste in the mouth. Heartburn may feel worse when you lie down or bend over, and it is often worse at night. Heartburn may be caused by stomach contents that move back up into the esophagus (reflux). Follow these instructions at home: Take these actions to decrease your discomfort and to help avoid complications. Diet  Follow a diet as recommended by your health care provider. This may involve avoiding foods and drinks such as: ? Coffee and tea (with or without caffeine). ? Drinks that contain alcohol. ? Energy drinks and sports drinks. ? Carbonated drinks or sodas. ? Chocolate and cocoa. ? Peppermint and mint flavorings. ? Garlic and onions. ? Horseradish. ? Spicy and acidic foods, including peppers, chili powder, curry powder, vinegar, hot sauces, and barbecue sauce. ? Citrus fruit juices and citrus fruits, such as oranges, lemons, and limes. ? Tomato-based foods, such as red sauce, chili, salsa, and pizza with red sauce. ? Fried and fatty foods, such  as donuts, french fries, potato chips, and high-fat dressings. ? High-fat meats, such as hot dogs and fatty cuts of red and white meats, such as rib eye steak, sausage, ham, and bacon. ? High-fat dairy items, such as whole milk, butter, and cream cheese.  Eat small, frequent meals instead of large meals.  Avoid drinking large amounts of liquid with your meals.  Avoid eating meals during the 2-3 hours before bedtime.  Avoid lying down right after you eat.  Do not exercise right after you eat. General instructions  Pay attention to any changes in your symptoms.  Take over-the-counter and prescription medicines only as told by your health care provider. Do not take aspirin, ibuprofen, or other NSAIDs unless your health care provider told you to do so.  Do not use any tobacco products, including cigarettes, chewing tobacco, and e-cigarettes. If you need help quitting, ask your health care provider.  Wear loose-fitting clothing. Do not wear anything tight around your waist that causes pressure on your abdomen.  Raise (elevate) the head of your bed about 6 inches (15 cm).  Try to reduce your stress, such as with yoga or meditation. If you need help reducing stress, ask your health care provider.  If you are overweight, reduce your weight to an amount that is healthy for you. Ask your health care provider for guidance about a safe weight loss goal.  Keep all follow-up visits as told by your health care provider. This is important. Contact a health care provider if:  You have new symptoms.  You have unexplained weight loss.  You have  difficulty swallowing, or it hurts to swallow.  You have wheezing or a persistent cough.  Your symptoms do not improve with treatment.  You have frequent heartburn for more than two weeks. Get help right away if:  You have pain in your arms, neck, jaw, teeth, or back.  You feel sweaty, dizzy, or light-headed.  You have chest pain or shortness of  breath.  You vomit and your vomit looks like blood or coffee grounds.  Your stool is bloody or black. This information is not intended to replace advice given to you by your health care provider. Make sure you discuss any questions you have with your health care provider. Document Released: 10/15/2008 Document Revised: 11/04/2015 Document Reviewed: 09/23/2014 Elsevier Interactive Patient Education  2017 [employer].

## 2017-03-12 NOTE — Assessment & Plan Note (Signed)
GERD not controlled and cause of chest pain D/c omeprazole Start prontonix 40 mg daily Advised GERD diet No nsaids - tylenol only F/u with [facility]

## 2017-03-12 NOTE — Assessment & Plan Note (Signed)
Elevated here today Will d/c valsartan-hctz and start losartan-hctz Advised close f/u with Ash[facility] - needs appt to establish cmp

## 2017-03-12 NOTE — Assessment & Plan Note (Signed)
Went to ED - not cardiac in nature - he does have known CAD and I recommended he follow up with Cardio Chest pain related to uncontrolled GERD D/c omeprazole Start protonix 40 mg daily Will f/u with Hollie [facility] - may need to see GI if not improving

## 2017-03-12 NOTE — Assessment & Plan Note (Signed)
Will refer to dr Tamala Julian for further evaluation

## 2017-03-21 NOTE — Progress Notes (Signed)
[name] [name], [place] [address] Phone: [phone] Subjective:    I'm seeing this patient by the request  of:  Andre Circle, FNP   CC: Low back pain  [id number]  Andre [name] is a 76 y.o. male coming in with complaint of low back pain. Patient states over the course last month has had significant amount body pains. Has been in the emergency room multiple times for chronic headaches, viral illnesses, as well as chest pain over the course last year. Patient tends to trying to ride a bicycle for exercise but finds it more difficult secondary to his low back pain with radiating left leg pain.   Headaches (6 weeks)- Worse in the morning. Duration varies. Right temple. He has a history of stroke.   Onset- 6 months  Location- left side pain  Duration- worse in the morning Character- Achy Aggravating factors- Seems to be worse after activity but not with activity Reliving factors- getting up and moving Therapies tried- patient Tylenol Severity- at its worse 8    patient's last imaging of the lumbar spine was taken on January 2017. Found to have moderate degenerative disc disease and facet disease throughout.  Past Medical History:  Diagnosis Date  . Arthritis   . Coronary artery disease, non-occlusive    a. 06/2011: cath for abnormal exercise echo: Only 30-40% proximal RCA. Otherwise normal. b. 10/2015: cath for abnormal NST and atypical CP --> showed mild 30-40% stenosis in the prox-distal RCA  . GERD (gastroesophageal reflux disease)   . HA (headache)   . Hypertension   . 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[facility])   . TIA (transient ischemic attack)   . Weakness    Past Surgical History:  Procedure Laterality Date  . CARDIAC CATHETERIZATION  January 2013   Proximal RCA 30-40%. Otherwise normal.  . CARDIAC CATHETERIZATION N/A 10/18/2015   Procedure: Left Heart Cath and Coronary Angiography;  Surgeon: Jettie Booze, MD;  Location: [facility];  Service: Cardiovascular;  Laterality: N/A;  . NM MYOVIEW LTD  March 2015   LOW RISK. No scar or ischemia. Normal EF (55%). No RWMA  . surgical excision of rectal cancer     Social History   Social History  . Marital status: Single    Spouse name: N/A  . Number of children: 11  . Years of education: 4   Occupational History  . Retried     retired   Social History Main Topics  . Smoking status: Current Some Day Smoker    Packs/day: 0.30    Years: 47.00    Types: Cigarettes  . Smokeless tobacco: Never Used  . Alcohol use 1.2 oz/week    2 Cans of beer per week  . Drug use: No  . Sexual activity: Not Asked   Other Topics Concern  . None   Social History Narrative   Patient is single, retired   Patient is right handed   Education level is high school   Caffeine consumption is 1 cup daily   No Known Allergies Family History  Problem Relation Age of Onset  . Hypertension Mother   . Cancer Mother   . Liver disease Father      Past medical history, social, surgical and family history all reviewed in electronic medical record.  No pertanent information unless stated regarding to the chief complaint.   Review of Systems:Review of systems updated and as accurate as of 03/22/17  No headache,  visual changes, nausea, vomiting, diarrhea, constipation, dizziness, abdominal pain, skin rash, fevers, chills, night sweats, weight loss, swollen lymph nodes, body aches, joint swelling, muscle aches, chest pain, shortness of breath, mood changes.    Objective  Blood pressure (!) 174/70, pulse 70, height 5\' 11"  (1.803 m), weight 205 lb (93 kg), SpO2 97 %. Systems examined below as of 03/22/17   General: No apparent distress alert and oriented x3 mood and affect normal, dressed appropriately.  HEENT: Pupils equal, extraocular movements intact  Respiratory: Patient's speak in full sentences and does not appear short of breath  Cardiovascular: No lower extremity edema, non  tender, no erythema  Skin: Warm dry intact with no signs of infection or rash on extremities or on axial skeleton.  Abdomen: Soft nontender  Neuro: Cranial nerves II through XII are intact, neurovascularly intact in all extremities with 2+ DTRs and 2+ pulses.  Lymph: No lymphadenopathy of posterior or anterior cervical chain or axillae bilaterally.  Gait normal with good balance and coordination.  MSK:  Non tender with full range of motion and good stability and symmetric strength and tone of shoulders, elbows, wrist, hip, knee and ankles bilaterally.  Back Exam:  Inspection: Unremarkable  Motion: Flexion 40 deg, Extension 25 deg, Side Bending to 35 deg bilaterally,  Rotation to 35 deg bilaterally  SLR laying: Negative significant tightness XSLR laying: Negative  Palpable tenderness: Tender to palpation seems to be more in the paraspinal musculature on the left side of the right side.Marland Kitchen FABER: Positive left. Sensory change: Gross sensation intact to all lumbar and sacral dermatomes.  Reflexes: 2+ at both patellar tendons, 2+ at achilles tendons, Babinski's downgoing.  Strength at foot  Plantar-flexion: 5/5 Dorsi-flexion: 5/5 Eversion: 5/5 Inversion: 5/5  Leg strength  Quad: 5/5 Hamstring: 5/5 Hip flexor: 4/5 Hip abductors: 4/5   Procedure 97110; 15 additional minutes spent for Therapeutic exercises as stated in above notes.  This included exercises focusing on stretching, strengthening, with significant focus on eccentric aspects.   Long term goals include an improvement in range of motion, strength, endurance as well as avoiding reinjury. Patient's frequency would include in 1-2 times a day, 3-5 times a week for a duration of 6-12 weeks. Low back exercises that included:  Pelvic tilt/bracing instruction to focus on control of the pelvic girdle and lower abdominal muscles  Glute strengthening exercises, focusing on proper firing of the glutes without engaging the low back muscles Proper  stretching techniques for maximum relief for the hamstrings, hip flexors, low back and some rotation where tolerated   Proper technique shown and discussed handout in great detail with ATC.  All questions were discussed and answered.     Impression and Recommendations:     This case required medical decision making of moderate complexity.      Note: This dictation was prepared with Dragon dictation along with smaller phrase technology. Any transcriptional errors that result from this process are unintentional.

## 2017-03-22 ENCOUNTER — Encounter: Payer: Self-pay | Admitting: Family Medicine

## 2017-03-22 ENCOUNTER — Ambulatory Visit (INDEPENDENT_AMBULATORY_CARE_PROVIDER_SITE_OTHER): Payer: Medicare Other | Admitting: Family Medicine

## 2017-03-22 DIAGNOSIS — M545 Low back pain, unspecified: Secondary | ICD-10-CM

## 2017-03-22 DIAGNOSIS — M79605 Pain in left leg: Secondary | ICD-10-CM

## 2017-03-22 NOTE — Assessment & Plan Note (Signed)
Patient does have degenerative disc disease. Patient does have arthritic changes. Likely some lumbar radiculopathy patient does not have any significant weakness noted today but severe decreased range of motion when trying to test patient's sacroiliac joint. Patient is to come back and see me again in 4 weeks after seeing how patient response.

## 2017-03-22 NOTE — Patient Instructions (Signed)
Good to see you  Ice is your friend Ice 20 minutes 2 times daily. Usually after activity and before bed. Exercises 3 times a week.  pennsaid pinkie amount topically 2 times daily as needed.   Vitamin D 2000 IU dialy  Turmeric 500mg  daily  Tart cherry extract any dose at night Continue the tylenol 3 times a day  See me again in 4 weeks.

## 2017-03-29 ENCOUNTER — Encounter: Payer: Self-pay | Admitting: Emergency Medicine

## 2017-04-20 ENCOUNTER — Encounter: Payer: Self-pay | Admitting: Nurse Practitioner

## 2017-04-20 ENCOUNTER — Ambulatory Visit (INDEPENDENT_AMBULATORY_CARE_PROVIDER_SITE_OTHER): Payer: Medicare Other | Admitting: Nurse Practitioner

## 2017-04-20 VITALS — BP 158/88 | HR 67 | Temp 97.9°F | Resp 16 | Ht 71.0 in | Wt 205.8 lb

## 2017-04-20 DIAGNOSIS — K2100 Gastro-esophageal reflux disease with esophagitis, without bleeding: Secondary | ICD-10-CM

## 2017-04-20 DIAGNOSIS — I10 Essential (primary) hypertension: Secondary | ICD-10-CM | POA: Diagnosis not present

## 2017-04-20 DIAGNOSIS — M1990 Unspecified osteoarthritis, unspecified site: Secondary | ICD-10-CM

## 2017-04-20 NOTE — Assessment & Plan Note (Signed)
Osteoarthritis, unspecified osteoarthritis type, unspecified site- has been taking tylenol daily with some relief. Discussed proper tylenol dosage and including exercise into his daily regimen. He has been exercising on his stationary bike but does often sit for long times to watch TV. Offered PT referral but hed like to continue increasing his exercise and see if his pain improves first.

## 2017-04-20 NOTE — Progress Notes (Signed)
Subjective:    Patient ID: Andre [name], male    DOB: 1941/03/07, 76 y.o.   MRN: [id number]  HPI  Andre [name] is a 76yo who presents today to establish care.  He is transferring to me from another provider in the same clinic.  GERD- ongoing for about 6 months. Describes as a burning pain in his midsternum radiating into his throat. Occurs almost daily and is worse when lying down at night. Worse after eating certain foods-acidic fruits, tomatoes. hes been seen in the ER and the office for this now, with negative cardiac workup. He reports taking protonix daily for almost 8 weeks now without any change. He denies cough, choking, shortness of breath.  Arthritis- chronic. Describes as a soreness in bilateral legs, hips and shoulders. The pain is worse at the end of the day and after sitting for long periods. He has been riding his stationary bike some to try to help the pain. He denies any injuries, deformities, edema  Hypertension-maintained on hyzaar. Not taking metoprolol because he didn't realize he was supposed to take both . Hell start back on metoprolol and continue hyzaar today. Reports headaches.  BP Readings from Last 3 Encounters:  04/20/17 (!) 158/88  03/22/17 (!) 174/70  03/12/17 (!) 178/86    Review of Systems  See HPI  Past Medical History:  Diagnosis Date  . Arthritis   . Coronary artery disease, non-occlusive    a. 06/2011: cath for abnormal exercise echo: Only 30-40% proximal RCA. Otherwise normal. b. 10/2015: cath for abnormal NST and atypical CP --> showed mild 30-40% stenosis in the prox-distal RCA  . GERD (gastroesophageal reflux disease)   . HA (headache)   . Hypertension   . 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[facility])   . TIA (transient ischemic attack)   . Weakness      Social History   Socioeconomic History  . Marital status: Single    Spouse name: Not on file  . Number of children: 11  . Years of education: 54  . Highest education level: Not on file  Social Needs  . Financial  resource strain: Not on file  . Food insecurity - worry: Not on file  . Food insecurity - inability: Not on file  . Transportation needs - medical: Not on file  . Transportation needs - non-medical: Not on file  Occupational History  . Occupation: Retried    Comment: retired  Tobacco Use  . Smoking status: Current Some Day Smoker    Packs/day: 0.30    Years: 47.00    Pack years: 14.10    Types: Cigarettes  . Smokeless tobacco: Never Used  Substance and Sexual Activity  . Alcohol use: Yes    Alcohol/week: 1.2 oz    Types: 2 Cans of beer per week  . Drug use: No  . Sexual activity: Not on file  Other Topics Concern  . Not on file  Social History Narrative   Patient is single, retired   Patient is right handed   Education level is high school   Caffeine consumption is 1 cup daily    Past Surgical History:  Procedure Laterality Date  . CARDIAC CATHETERIZATION  January 2013   Proximal RCA 30-40%. Otherwise normal.  . NM MYOVIEW LTD  March 2015   LOW RISK. No scar or ischemia. Normal EF (55%). No RWMA  . surgical excision of rectal cancer      Family History  Problem Relation Age of Onset  . Hypertension Mother   .  Cancer Mother   . Liver disease Father     No Known Allergies  Current Outpatient Medications on File Prior to Visit  Medication Sig Dispense Refill  . acetaminophen (TYLENOL) 325 MG tablet Take 325-650 mg by mouth every 6 (six) hours as needed for mild pain or headache.    . albuterol (PROVENTIL HFA;VENTOLIN HFA) 108 (90 Base) MCG/ACT inhaler Inhale 2 puffs into the lungs every 6 (six) hours as needed for wheezing or shortness of breath. 1 Inhaler 2  . aspirin EC 81 MG tablet Take 81 mg by mouth daily.    Marland Kitchen losartan-hydrochlorothiazide (HYZAAR) 100-25 MG tablet Take 1 tablet by mouth daily. 90 tablet 3  . metoprolol succinate (TOPROL-XL) 25 MG 24 hr tablet take 1 tablet by mouth once daily 90 tablet 3  . nitroGLYCERIN (NITROSTAT) 0.4 MG SL tablet Place 1  tablet (0.4 mg total) under the tongue every 5 (five) minutes x 3 doses as needed for chest pain. 25 tablet 0  . nortriptyline (PAMELOR) 25 MG capsule Take 1 capsule (25 mg total) by mouth at bedtime. 90 capsule 4  . pantoprazole (PROTONIX) 40 MG tablet Take 1 tablet (40 mg total) by mouth daily. 90 tablet 1  . [DISCONTINUED] chlorthalidone (HYGROTON) 25 MG tablet Take 25 mg by mouth daily.     No current facility-administered medications on file prior to visit.     BP (!) 158/88 (BP Location: Left Arm, Patient Position: Sitting, Cuff Size: Large)   Pulse 67   Temp 97.9 F (36.6 C) (Oral)   Resp 16   Ht 5\' 11"  (1.803 m)   Wt 205 lb 12.8 oz (93.4 kg)   SpO2 98%   BMI 28.70 kg/m    Objective:   Physical Exam  Constitutional: He is oriented to person, place, and time. He appears well-developed and well-nourished. No distress.  HENT:  Head: Normocephalic and atraumatic.  Cardiovascular: Normal rate, regular rhythm and intact distal pulses.  Pulmonary/Chest: Effort normal and breath sounds normal.  Abdominal: Soft. Bowel sounds are normal. There is no tenderness. There is no guarding.  Umbilical hernia  Neurological: He is alert and oriented to person, place, and time. Coordination normal.  Skin: Skin is warm and dry.  Psychiatric: He has a normal mood and affect. Judgment and thought content normal.      Assessment & Plan:  A total of 45 minutes were spent face-to-face with the patient during this encounter and over half of that time was spent on counseling and coordination of care. The patient was counseled on medication therapy.   The patient has seems very confused about his medication regimen and why he is taking each drug. We spent an extensive amount of time discussing each medication on his list and the indication for each medication. Handwritten notes were provided. I am concerned that he may not be able to read the list. He says that he will get his son to start helping him  with the medications. We may need to explore in home nursing options for medication management if he is unable to get help from his son.

## 2017-04-20 NOTE — Patient Instructions (Addendum)
For your body pain, you may continue tylenol, up to 3 grams (or 9 - 325mg  pills) every 24 hours. Walking, swimming and bicycling daily will likely help your pain. If your pain persists with daily exercise, please let me know and we can try physical therapy.  Remember to take your hyzaar (losartan-hydrochlorothiazide) AND metoprolol (TOPROL - xl) BOTH daily.  Continue taking your protonix 40mg  once daily for acid symptoms.  I would like you to see a gastroenterologist for your acid reflux, I have placed a referral for this. Our office will call you to schedule these appointments.  Id like to see you back in one month to see how you are doing and recheck your blood pressure.  It was nice to meet you.Thanks for letting me take care of you today :)  Food Choices for Gastroesophageal Reflux Disease, Adult When you have gastroesophageal reflux disease (GERD), the foods you eat and your eating habits are very important. Choosing the right foods can help ease your discomfort. What guidelines do I need to follow?  Choose fruits, vegetables, whole grains, and low-fat dairy products.  Choose low-fat meat, fish, and poultry.  Limit fats such as oils, salad dressings, butter, nuts, and avocado.  Keep a food diary. This helps you identify foods that cause symptoms.  Avoid foods that cause symptoms. These may be different for everyone.  Eat small meals often instead of 3 large meals a day.  Eat your meals slowly, in a place where you are relaxed.  Limit fried foods.  Cook foods using methods other than frying.  Avoid drinking alcohol.  Avoid drinking large amounts of liquids with your meals.  Avoid bending over or lying down until 2-3 hours after eating. What foods are not recommended? These are some foods and drinks that may make your symptoms worse: Vegetables Tomatoes. Tomato juice. Tomato and spaghetti sauce. Chili peppers. Onion and garlic. Horseradish. Fruits Oranges, grapefruit, and  lemon (fruit and juice). Meats High-fat meats, fish, and poultry. This includes hot dogs, ribs, ham, sausage, salami, and bacon. Dairy Whole milk and chocolate milk. Sour cream. Cream. Butter. Ice cream. Cream cheese. Drinks Coffee and tea. Bubbly (carbonated) drinks or energy drinks. Condiments Hot sauce. Barbecue sauce. Sweets/Desserts Chocolate and cocoa. Donuts. Peppermint and spearmint. Fats and Oils High-fat foods. This includes [place] fries and potato chips. Other Vinegar. Strong spices. This includes black pepper, white pepper, red pepper, cayenne, curry powder, cloves, ginger, and chili powder. The items listed above may not be a complete list of foods and drinks to avoid. Contact your dietitian for more information. This information is not intended to replace advice given to you by your health care provider. Make sure you discuss any questions you have with your health care provider. Document Released: 11/28/2011 Document Revised: 11/04/2015 Document Reviewed: 04/02/2013 Elsevier Interactive Patient Education  2017 [employer].

## 2017-04-20 NOTE — Assessment & Plan Note (Signed)
Essential hypertension, benign- maintained on hyzaar and toprol XL. He was recently started on hyzaar and stopped taking toprol at that time because he did not understand that he was to take both medications daily. Education today including daily medication compliance and BP goal <150/90. Hell resume toprol and continue hyzaar, return in 1 month for follow up

## 2017-04-20 NOTE — Assessment & Plan Note (Signed)
Gastroesophageal reflux disease with esophagitis- Continued symptoms despite daily protonix. Negative cardiac evaluation in ER in September. Hell continue protonix daily and referral to Gastroenterology placed today.

## 2017-04-22 IMAGING — DX DG CHEST 2V
2 series · 2 of 2 positions shown · non-contrast
Comparison: 02/22/2016 and previous

CLINICAL DATA: Mid chest pain and headache, intermittent over the
last few months. Productive cough.

EXAM:
CHEST  2 VIEW

[chest pa]
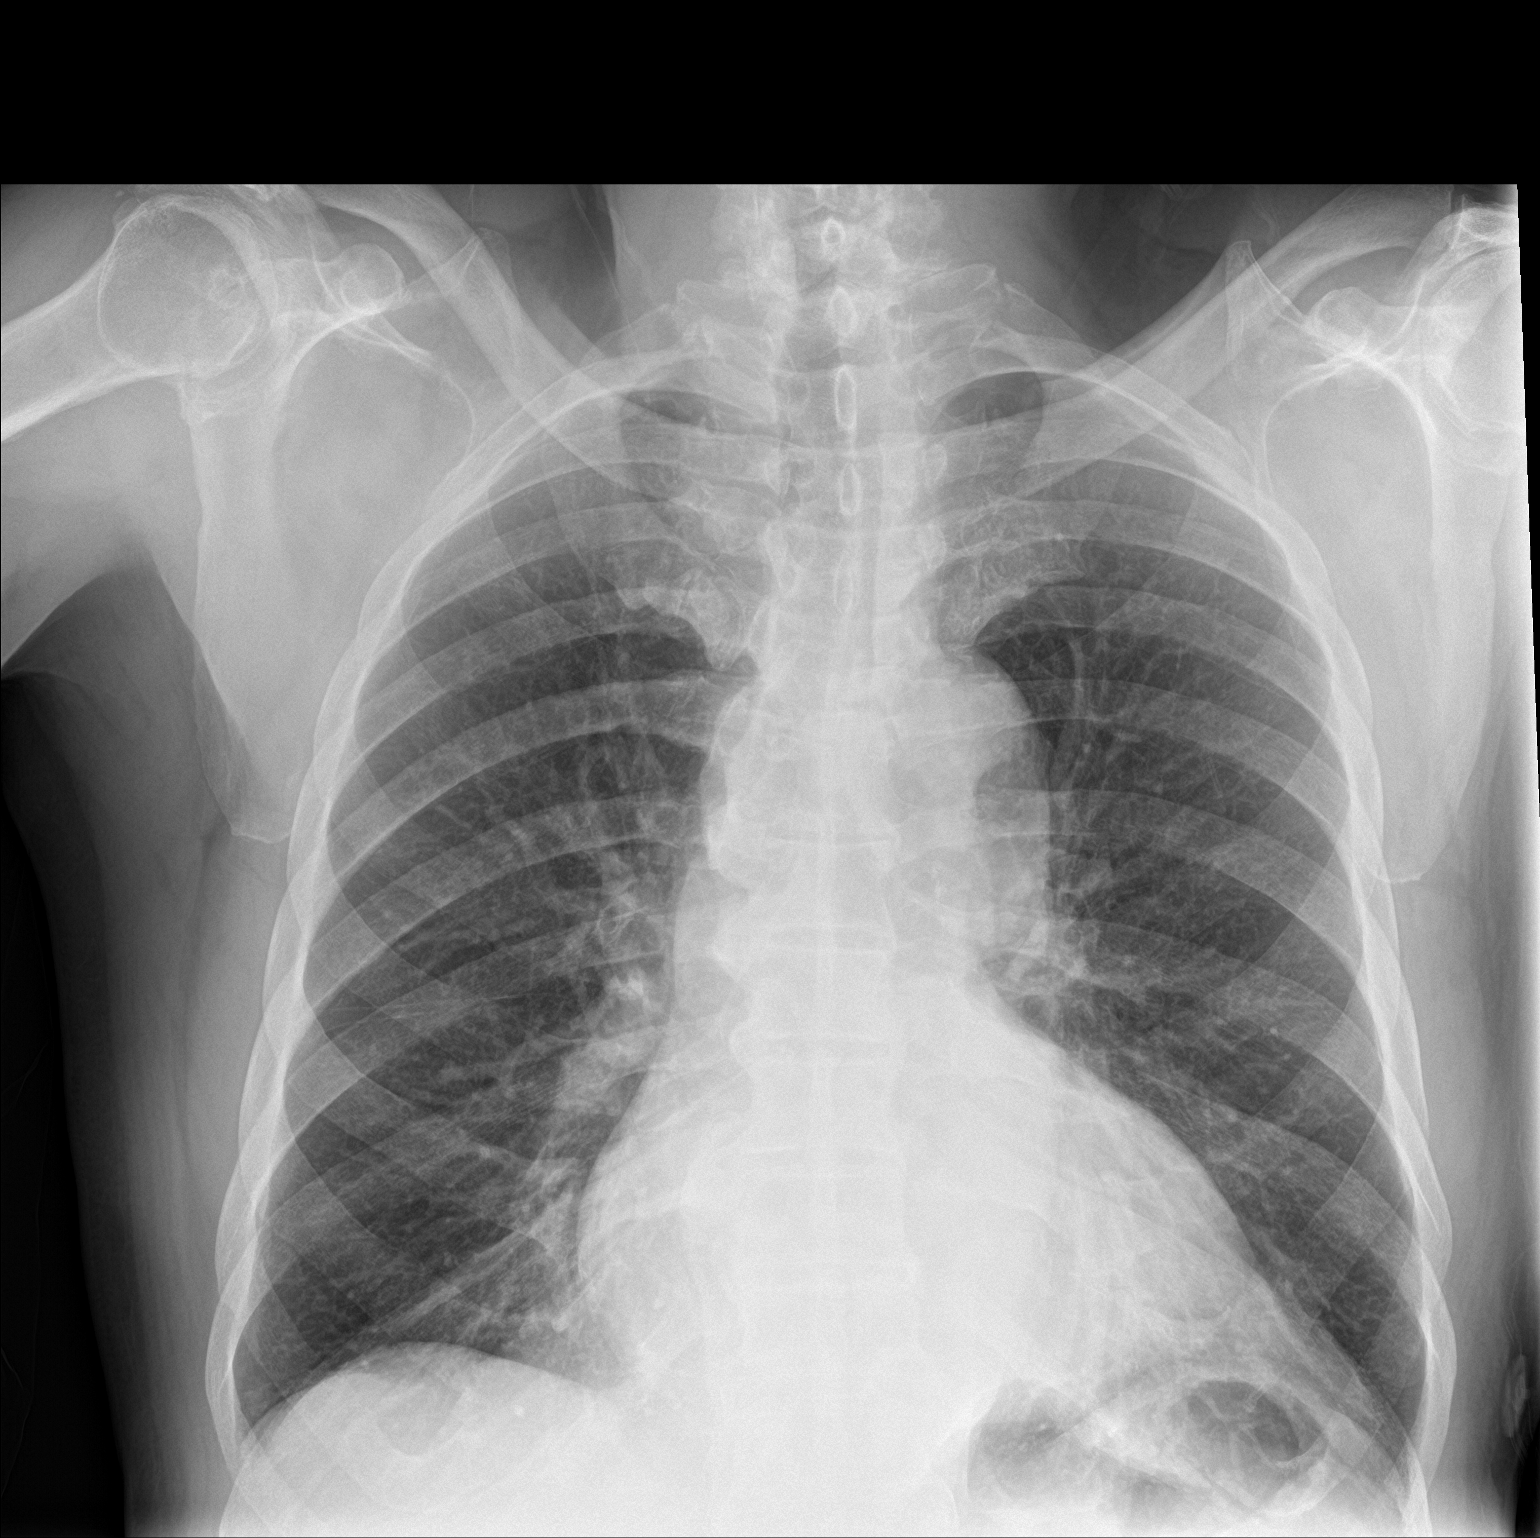

[chest lat]
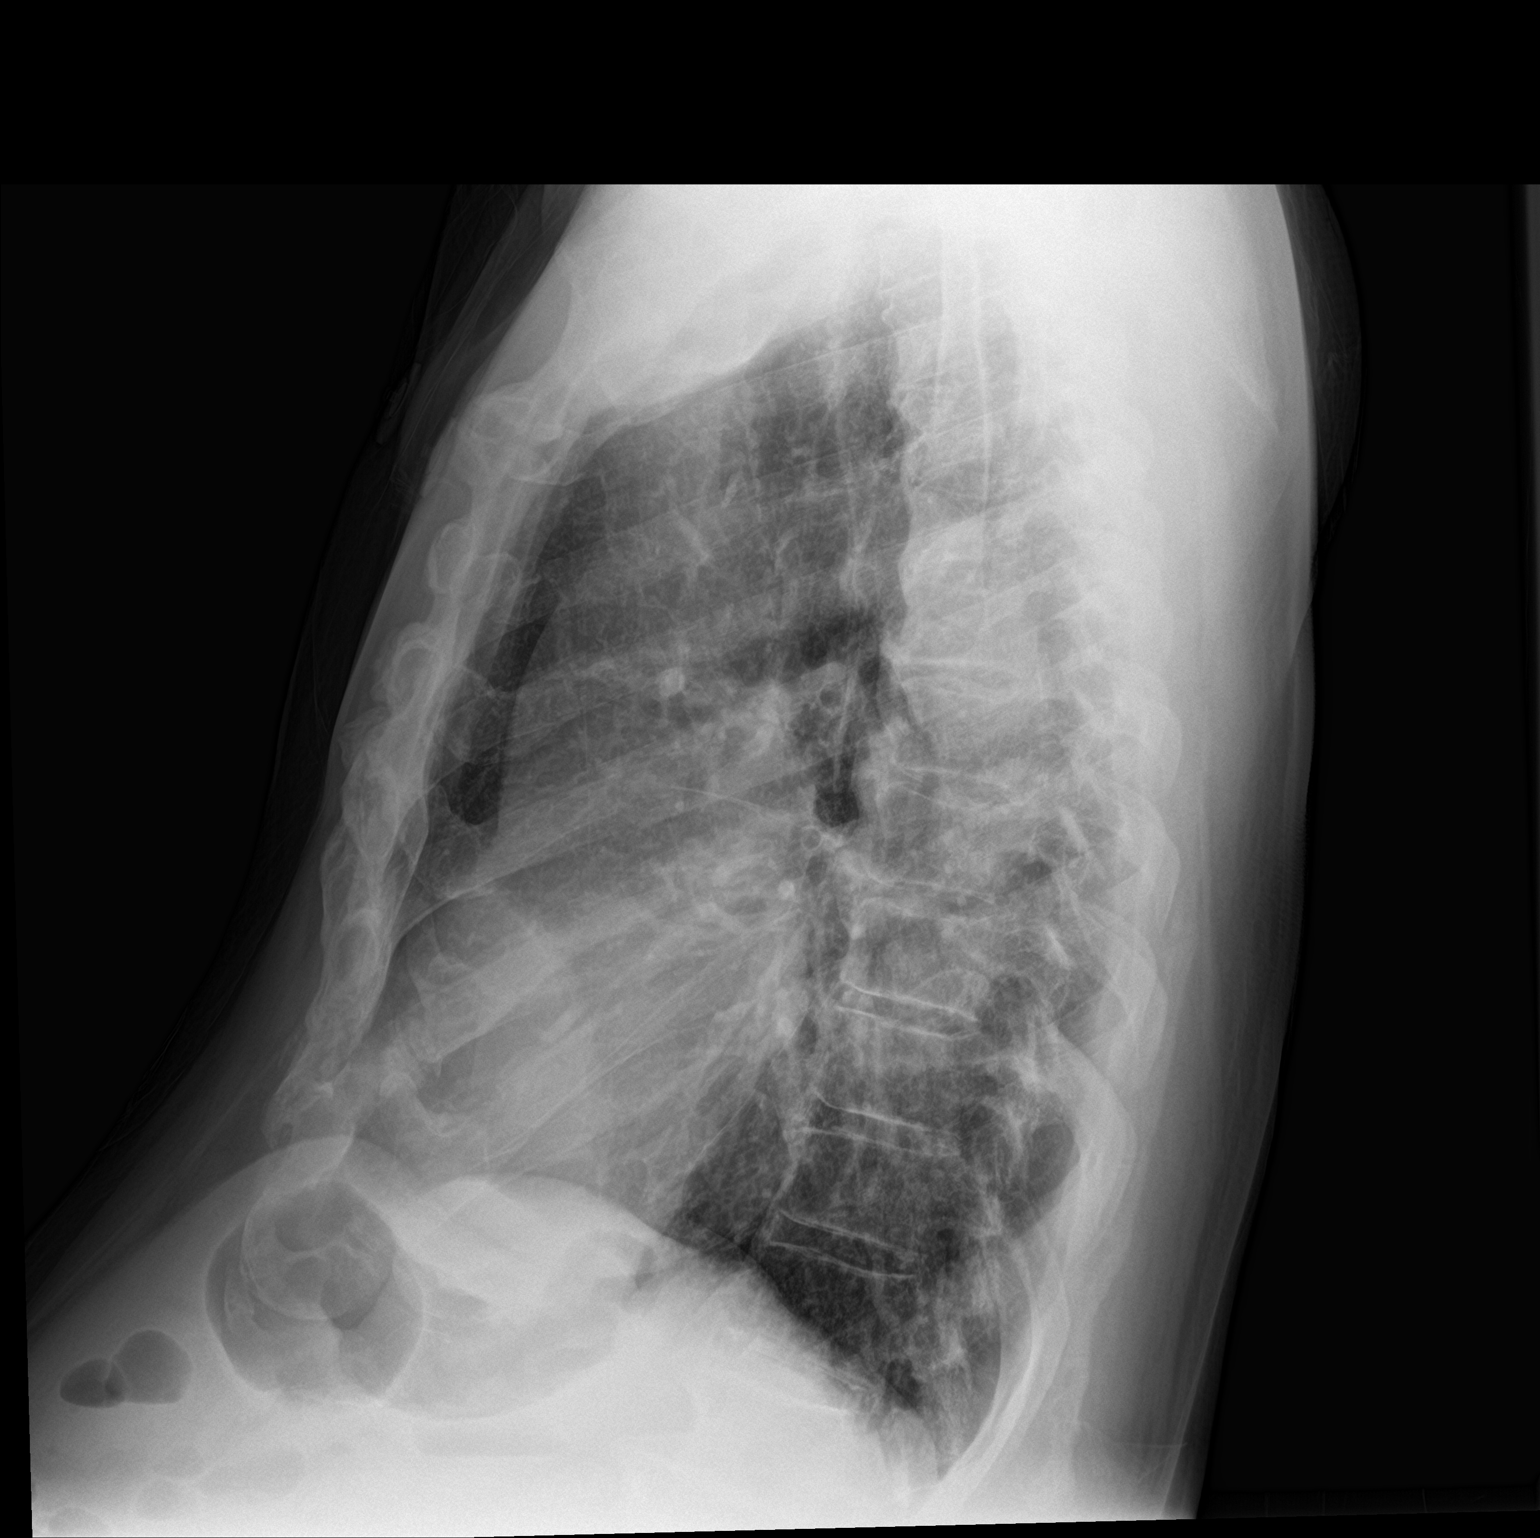

[2 of 2 positions shown; findings below may reference images not displayed]

FINDINGS: The heart is enlarged. There is aortic atherosclerosis. The lungs
are clear without infiltrate, collapse or effusion. Ordinary
degenerative changes affect the spine.
IMPRESSION: Cardiomegaly.  Aortic atherosclerosis.  No active pulmonary disease.

## 2017-04-23 DIAGNOSIS — K219 Gastro-esophageal reflux disease without esophagitis: Secondary | ICD-10-CM | POA: Diagnosis not present

## 2017-05-10 ENCOUNTER — Ambulatory Visit (INDEPENDENT_AMBULATORY_CARE_PROVIDER_SITE_OTHER): Payer: Medicare Other | Admitting: Nurse Practitioner

## 2017-05-10 ENCOUNTER — Encounter: Payer: Self-pay | Admitting: Nurse Practitioner

## 2017-05-10 ENCOUNTER — Other Ambulatory Visit: Payer: Self-pay | Admitting: *Deleted

## 2017-05-10 ENCOUNTER — Other Ambulatory Visit: Payer: Medicare Other

## 2017-05-10 VITALS — BP 174/98 | HR 64 | Temp 97.7°F | Resp 16 | Wt 212.0 lb

## 2017-05-10 DIAGNOSIS — I10 Essential (primary) hypertension: Secondary | ICD-10-CM | POA: Diagnosis not present

## 2017-05-10 DIAGNOSIS — R079 Chest pain, unspecified: Secondary | ICD-10-CM | POA: Diagnosis not present

## 2017-05-10 DIAGNOSIS — M5442 Lumbago with sciatica, left side: Secondary | ICD-10-CM

## 2017-05-10 DIAGNOSIS — K2100 Gastro-esophageal reflux disease with esophagitis, without bleeding: Secondary | ICD-10-CM

## 2017-05-10 DIAGNOSIS — M5441 Lumbago with sciatica, right side: Secondary | ICD-10-CM

## 2017-05-10 DIAGNOSIS — Z550 Illiteracy and low-level literacy: Secondary | ICD-10-CM | POA: Diagnosis not present

## 2017-05-10 DIAGNOSIS — G8929 Other chronic pain: Secondary | ICD-10-CM | POA: Diagnosis not present

## 2017-05-10 LAB — POC URINALSYSI DIPSTICK (AUTOMATED)
BILIRUBIN UA: NEGATIVE
Blood, UA: NEGATIVE
Glucose, UA: NEGATIVE
KETONES UA: NEGATIVE
LEUKOCYTES UA: NEGATIVE
Nitrite, UA: NEGATIVE
PH UA: 6 (ref 5.0–8.0)
PROTEIN UA: NEGATIVE
SPEC GRAV UA: 1.025 (ref 1.010–1.025)
Urobilinogen, UA: 0.2 E.U./dL

## 2017-05-10 MED ORDER — FAMOTIDINE 20 MG PO TABS: 20.0000 mg | ORAL_TABLET | Freq: Every day | ORAL | 3 refills | Status: DC

## 2017-05-10 MED ORDER — FAMOTIDINE 20 MG PO TABS: 20.0000 mg | ORAL_TABLET | Freq: Every day | ORAL | 2 refills | Status: DC

## 2017-05-10 NOTE — Patient Instructions (Addendum)
Your EKG today is normal.  Your urine did not show any signs of infection, we will culture your urine to make sure it does not grow any bacteria.  I have placed a referral to cardiology for your chest pain, [facility] for medication help, and physical therapy for your back pain. Our office will call you to schedule these appointments. You should hear from our office in 7-10 days.  Please stop smoking. This will help your blood pressure and your acid reflux.  Please take both your hyzaar and toprol daily. I would like for you to come back in 2 weeks so that we can recheck your blood pressure and see how you are doing.  I have sent another medication for your acid to your pharmacy. The medication is pepcid. Please take 1 pill daily at bedtime. Please keep taking your protonix as prescribed.  It was nice to see you. Thanks for letting me take care of you today :)

## 2017-05-10 NOTE — Patient Outreach (Signed)
[facility]) Care Management  05/10/2017  Andre [name] [birth year] [id number]  Telephone Screen  Referral Date: 05/10/17 Referral Source: MD referral Andre [name], N.P.) Referral Reason: Medication Management; patient might be illerate Insurance: [facility] Medicare   Outreach attempt # 1 to patient. HIPAA verified with patient. Patient has a past medical history of HTN, Arthritis, GERD, TIA, and Chronic Back Pain. RN CM received referral from MD's office related to medication management and questionable literacy. Patient had a scheduled visit with his primary MD today (05/10/17). Per EMR, he is having difficulty managing his medical conditions. Patient was prescribed blood pressure medications and he was not taking as prescribed. He stated, "He was not aware that he should be taking the medication". He acknowledged taking one of the hypertensive medications, however he was prescribed 2. He stated, he didn't receive instructions on how to take the medication. His plans were to take the newly prescribed HTN (Losartan-Hydrochlorothiazide) medication after completion of the other HTN medication (Metoprolol). Per EMR, patient's last blood pressure reading was 174/98. Patient complained of having severe headaches and not feeling well. He verbalized taking both HTH medications today. He reported being out of some of his medications. He was unable to verbalize the reason for taking his medications. Patient confirmed, he continues to smoke, however he is ready to quit. [facility] services and benefits explained to patient and he agreed to services.    Plan: RN CM advised patient to contact RNCM for any needs or concerns. RN CM will send referral to [facility] RN for further in home eval/assessment of care needs and management of chronic conditions.  Andre Bells, RN, BSN, MHA/MSL, [place] Telephonic Care Manager Coordinator [facility] Direct Phone: [phone] Toll Free:  [phone] Fax: [phone]

## 2017-05-10 NOTE — Progress Notes (Signed)
Subjective:    Patient ID: Andre [name], male    DOB: 12/21/1940, 76 y.o.   MRN: [id number]  HPI Mr. Barrows is a 76 yo male who presents today for an acute visit. He has several complaints today including back pian, chest pain and hypertension.  Lower back pain,chronic- describes as sore pain in bilateral legs and hips. He noticed the pain worse at the end of the day. He has been trying tylenol and riding stationary bike for exercise but the pain is persistent. He reports urinary frequency. He denies falls, fevers, weakness, loss of bowel or bladder control, hematuria, dysuria. He has a history of degenerative disc and facet disease on last imaging of lumbar spine in January 2017. He did see sports medicine for the back pain in October with instructions given for ice and exercise and follow up in 1 month but he did not return for follow up.  Chest pain- The pain has been intermittent for the past several months. The pain is a burning pain in his midsternum. The pain is worse after lying down and after eating certain foods like peppermint candy. He describes as an "acid feeling" in his chest. He was seen in the ER, in primary care and by GI specialist in the past 2 months with negative cardiac workup and diagnosis of GERD. He reports his last GI visit was with Dr Benson Norway about 1.5 weeks ago and says he was told to continue his protonix with no changes.  Hypertension- maintained on hyzaar and toprol. He has forgotten to take his toprol for about 3 weeks. Hes had some headaches and felt like his BP was high. We spent an extensive amount of time going over his medications during his last visit and he was going to see if his son would help him with his medications every week but was unable to get any help. He says that he has trouble reading medication bottles and did admit to having "a little bit" of trouble reading in general when I asked him.  BP Readings from Last 3 Encounters:  05/10/17 (!) 174/98    04/20/17 (!) 158/88  03/22/17 (!) 174/70    Review of Systems  See HPI   Past Medical History:  Diagnosis Date  . Arthritis   . Coronary artery disease, non-occlusive    a. 06/2011: cath for abnormal exercise echo: Only 30-40% proximal RCA. Otherwise normal. b. 10/2015: cath for abnormal NST and atypical CP --> showed mild 30-40% stenosis in the prox-distal RCA  . GERD (gastroesophageal reflux disease)   . HA (headache)   . Hypertension   . 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[place])   . TIA (transient ischemic attack)   . Weakness      Social History   Socioeconomic History  . Marital status: Single    Spouse name: Not on file  . Number of children: 11  . Years of education: 33  . Highest education level: Not on file  Social Needs  . Financial resource strain: Not on file  . Food insecurity - worry: Not on file  . Food insecurity - inability: Not on file  . Transportation needs - medical: Not on file  . Transportation needs - non-medical: Not on file  Occupational History  . Occupation: Retried    Comment: retired  Tobacco Use  . Smoking status: Current Some Day Smoker    Packs/day: 0.30    Years: 47.00    Pack years: 14.10    Types: Cigarettes  .  Smokeless tobacco: Never Used  Substance and Sexual Activity  . Alcohol use: Yes    Alcohol/week: 1.2 oz    Types: 2 Cans of beer per week  . Drug use: No  . Sexual activity: Not on file  Other Topics Concern  . Not on file  Social History Narrative   Patient is single, retired   Patient is right handed   Education level is high school   Caffeine consumption is 1 cup daily    Past Surgical History:  Procedure Laterality Date  . CARDIAC CATHETERIZATION  January 2013   Proximal RCA 30-40%. Otherwise normal.  . CARDIAC CATHETERIZATION N/A 10/18/2015   Procedure: Left Heart Cath and Coronary Angiography;  Surgeon: Jettie Booze, MD;  Location: [facility];  Service: Cardiovascular;  Laterality: N/A;  . NM MYOVIEW LTD   March 2015   LOW RISK. No scar or ischemia. Normal EF (55%). No RWMA  . surgical excision of rectal cancer      Family History  Problem Relation Age of Onset  . Hypertension Mother   . Cancer Mother   . Liver disease Father     No Known Allergies  Current Outpatient Medications on File Prior to Visit  Medication Sig Dispense Refill  . acetaminophen (TYLENOL) 325 MG tablet Take 325-650 mg by mouth every 6 (six) hours as needed for mild pain or headache.    Marland Kitchen aspirin EC 81 MG tablet Take 81 mg by mouth daily.    . metoprolol succinate (TOPROL-XL) 25 MG 24 hr tablet take 1 tablet by mouth once daily 90 tablet 3  . pantoprazole (PROTONIX) 40 MG tablet Take 1 tablet (40 mg total) by mouth daily. 90 tablet 1  . losartan-hydrochlorothiazide (HYZAAR) 100-25 MG tablet Take 1 tablet by mouth daily. (Patient not taking: Reported on 05/10/2017) 90 tablet 3  . nitroGLYCERIN (NITROSTAT) 0.4 MG SL tablet Place 1 tablet (0.4 mg total) under the tongue every 5 (five) minutes x 3 doses as needed for chest pain. (Patient not taking: Reported on 05/10/2017) 25 tablet 0  . nortriptyline (PAMELOR) 25 MG capsule Take 1 capsule (25 mg total) by mouth at bedtime. (Patient not taking: Reported on 05/10/2017) 90 capsule 4  . [DISCONTINUED] chlorthalidone (HYGROTON) 25 MG tablet Take 25 mg by mouth daily.     No current facility-administered medications on file prior to visit.     BP (!) 174/98   Pulse 64   Temp 97.7 F (36.5 C) (Oral)   Resp 16   Wt 212 lb (96.2 kg)   SpO2 98%   BMI 29.57 kg/m       Objective:   Physical Exam  Constitutional: He is oriented to person, place, and time. He appears well-developed and well-nourished. No distress.  HENT:  Head: Normocephalic and atraumatic.  Cardiovascular: Normal rate, regular rhythm, normal heart sounds and intact distal pulses.  Pulmonary/Chest: Effort normal and breath sounds normal.  Musculoskeletal: Normal range of motion. He exhibits no edema.         Lumbar back: He exhibits tenderness. He exhibits normal range of motion and no swelling.  Negative straight leg raise.  Neurological: He is alert and oriented to person, place, and time. Coordination normal.  Skin: Skin is warm and dry.  Psychiatric: He has a normal mood and affect. Judgment and thought content normal.      Assessment & Plan:  RTC in 2 weeks for follow up of GERD, HTN

## 2017-05-11 ENCOUNTER — Emergency Department (HOSPITAL_COMMUNITY)
Admission: EM | Admit: 2017-05-11 | Discharge: 2017-05-12 | Disposition: A | Discharge status: Home / Self Care | Payer: Medicare Other | Attending: Emergency Medicine | Admitting: Emergency Medicine

## 2017-05-11 ENCOUNTER — Encounter (HOSPITAL_COMMUNITY): Payer: Self-pay | Admitting: Emergency Medicine

## 2017-05-11 ENCOUNTER — Other Ambulatory Visit: Payer: Self-pay

## 2017-05-11 DIAGNOSIS — Z5321 Procedure and treatment not carried out due to patient leaving prior to being seen by health care provider: Secondary | ICD-10-CM | POA: Insufficient documentation

## 2017-05-11 DIAGNOSIS — R51 Headache: Secondary | ICD-10-CM | POA: Insufficient documentation

## 2017-05-11 LAB — URINE CULTURE
MICRO NUMBER:: 81342010
SPECIMEN QUALITY: ADEQUATE

## 2017-05-11 LAB — URINALYSIS, ROUTINE W REFLEX MICROSCOPIC
Bilirubin Urine: NEGATIVE
GLUCOSE, UA: NEGATIVE mg/dL
HGB URINE DIPSTICK: NEGATIVE
KETONES UR: NEGATIVE mg/dL
Leukocytes, UA: NEGATIVE
Nitrite: NEGATIVE
PROTEIN: NEGATIVE mg/dL
Specific Gravity, Urine: 1.011 (ref 1.005–1.030)
pH: 8 (ref 5.0–8.0)

## 2017-05-11 NOTE — ED Notes (Signed)
Pt Called to be roomed x3 no response.

## 2017-05-11 NOTE — ED Notes (Signed)
Called Pt to be roomed x2 no response.

## 2017-05-11 NOTE — ED Triage Notes (Signed)
Pt verbalizes headache and lower back pain radiating down legs for past week; "it got worser last night."

## 2017-05-11 NOTE — ED Notes (Signed)
Called Pt to be roomed, no response x1

## 2017-05-12 ENCOUNTER — Emergency Department (HOSPITAL_COMMUNITY): Payer: Medicare Other

## 2017-05-12 ENCOUNTER — Emergency Department (HOSPITAL_COMMUNITY)
Admission: EM | Admit: 2017-05-12 | Discharge: 2017-05-12 | Disposition: A | Discharge status: Home / Self Care | Payer: Medicare Other | Attending: Emergency Medicine | Admitting: Emergency Medicine

## 2017-05-12 ENCOUNTER — Encounter (HOSPITAL_COMMUNITY): Payer: Self-pay | Admitting: Emergency Medicine

## 2017-05-12 DIAGNOSIS — I2510 Atherosclerotic heart disease of native coronary artery without angina pectoris: Secondary | ICD-10-CM | POA: Insufficient documentation

## 2017-05-12 DIAGNOSIS — Z8673 Personal history of transient ischemic attack (TIA), and cerebral infarction without residual deficits: Secondary | ICD-10-CM | POA: Insufficient documentation

## 2017-05-12 DIAGNOSIS — F17210 Nicotine dependence, cigarettes, uncomplicated: Secondary | ICD-10-CM | POA: Diagnosis not present

## 2017-05-12 DIAGNOSIS — R51 Headache: Secondary | ICD-10-CM | POA: Diagnosis not present

## 2017-05-12 DIAGNOSIS — G8929 Other chronic pain: Secondary | ICD-10-CM

## 2017-05-12 DIAGNOSIS — M5441 Lumbago with sciatica, right side: Secondary | ICD-10-CM | POA: Diagnosis not present

## 2017-05-12 DIAGNOSIS — Z550 Illiteracy and low-level literacy: Secondary | ICD-10-CM | POA: Insufficient documentation

## 2017-05-12 DIAGNOSIS — Z79899 Other long term (current) drug therapy: Secondary | ICD-10-CM | POA: Diagnosis not present

## 2017-05-12 DIAGNOSIS — I10 Essential (primary) hypertension: Secondary | ICD-10-CM | POA: Diagnosis not present

## 2017-05-12 DIAGNOSIS — Z7982 Long term (current) use of aspirin: Secondary | ICD-10-CM | POA: Insufficient documentation

## 2017-05-12 DIAGNOSIS — M545 Low back pain: Secondary | ICD-10-CM | POA: Diagnosis not present

## 2017-05-12 DIAGNOSIS — R519 Headache, unspecified: Secondary | ICD-10-CM

## 2017-05-12 LAB — I-STAT CHEM 8, ED
BUN: 5 mg/dL — ABNORMAL LOW (ref 6–20)
CALCIUM ION: 1.04 mmol/L — AB (ref 1.15–1.40)
Chloride: 98 mmol/L — ABNORMAL LOW (ref 101–111)
Creatinine, Ser: 0.7 mg/dL (ref 0.61–1.24)
GLUCOSE: 123 mg/dL — AB (ref 65–99)
HCT: 53 % — ABNORMAL HIGH (ref 39.0–52.0)
HEMOGLOBIN: 18 g/dL — AB (ref 13.0–17.0)
Potassium: 3.8 mmol/L (ref 3.5–5.1)
SODIUM: 140 mmol/L (ref 135–145)
TCO2: 31 mmol/L (ref 22–32)

## 2017-05-12 MED ORDER — ACETAMINOPHEN 500 MG PO TABS: 1000.0000 mg | ORAL_TABLET | Freq: Three times a day (TID) | ORAL | 0 refills | Status: AC

## 2017-05-12 MED ORDER — IOPAMIDOL (ISOVUE-300) INJECTION 61%
INTRAVENOUS | Status: AC
  Administered 2017-05-12: 75 mL
  Filled 2017-05-12: qty 75

## 2017-05-12 MED ORDER — ACETAMINOPHEN 500 MG PO TABS
1000.0000 mg | ORAL_TABLET | Freq: Once | ORAL | Status: AC
  Administered 2017-05-12: 1000 mg via ORAL
  Filled 2017-05-12: qty 2

## 2017-05-12 NOTE — Assessment & Plan Note (Signed)
I believe his chest pain is likely related to uncontrolled GERD. Will repeat EKG today and refer to cardiology as he does have history of hypertension and abnormal stress test in the past. If negative cardiac evaluation, we may need to have him follow back up with GI for further evaluation of GERD - EKG 12-Lead- I personally reviewed the patients EKG today; sinus bradycardia with no acute changes. We will add pepcid daily and he will continue his protonix - famotidine (PEPCID) 20 MG tablet; Take 1 tablet (20 mg total) by mouth at bedtime.  Dispense: 30 tablet; Refill: 2 We talked about gerd triggers including smoking, tomato, acid foods, peppermint, soda, greasy food. I advised him to stop smoking. He will return in 2 weeks for GERD follow up

## 2017-05-12 NOTE — ED Triage Notes (Signed)
Patient reports intermittent headache and low back pain for 2 weeks unrelieved by OTC Tylenol .

## 2017-05-12 NOTE — Assessment & Plan Note (Signed)
Pain continues despite daily exercise at home. I am not sure why he did not follow up with sports med but he did say hed be willing to try PT today. I have placed a referral, we can discuss sports medicine follow up again if he continues to have pain after PT. - Ambulatory referral to Physical Therapy UA dip in office is negative for bacterial infection. Urine culture sent.

## 2017-05-12 NOTE — Discharge Instructions (Signed)
You may use over-the-counter Motrin (Ibuprofen), Acetaminophen (Tylenol), topical muscle creams such as SalonPas, Icy Hot, Bengay, etc. Please stretch, apply heat, and have massage therapy for additional assistance. ° °

## 2017-05-12 NOTE — ED Provider Notes (Signed)
[facility] EMERGENCY DEPARTMENT Provider Note  CSN: [id number] Arrival date & time: 05/12/17 0700  Chief Complaint(s) Headache (2 Weeks)  HPI Andre [name] is a 76 y.o. male   The history is provided by the patient.  Headache   This is a recurrent problem. Episode frequency: intermittent. The problem has been gradually worsening. The headache is associated with an unknown factor. The pain is located in the right unilateral region. The quality of the pain is described as dull. The pain is moderate. The pain does not radiate. Pertinent negatives include no fever, no malaise/fatigue, no nausea and no vomiting. He has tried acetaminophen for the symptoms. The treatment provided mild relief.  Back Pain   This is a chronic problem. Episode frequency: intermittent. The problem has been gradually worsening. The pain is associated with no known injury. The pain is present in the lumbar spine. The quality of the pain is described as aching and stabbing. Radiates to: at times it radiates to right leg. The symptoms are aggravated by bending, twisting and certain positions. Associated symptoms include headaches. Pertinent negatives include no fever, no bowel incontinence, no perianal numbness and no bladder incontinence.    Past Medical History Past Medical History:  Diagnosis Date  . Arthritis   . Coronary artery disease, non-occlusive    a. 06/2011: cath for abnormal exercise echo: Only 30-40% proximal RCA. Otherwise normal. b. 10/2015: cath for abnormal NST and atypical CP --> showed mild 30-40% stenosis in the prox-distal RCA  . GERD (gastroesophageal reflux disease)   . HA (headache)   . Hypertension   . 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[place])   . TIA (transient ischemic attack)   . Weakness    Patient Active Problem List   Diagnosis Date Noted  . Osteoarthritis 04/20/2017  . Diabetes ([place]) 03/12/2017  . Acute non-recurrent frontal sinusitis 01/04/2017  . COPD exacerbation ([place]) 12/14/2016    . Acute upper respiratory infection 06/20/2016  . Back pain 02/29/2016  . Abnormal stress test 10/18/2015  . Erectile dysfunction 02/25/2015  . Cigarette smoker 02/10/2015  . COPD GOLD 0 11/08/2014  . Hemorrhoids 02/11/2014  . Constipation 05/30/2013  . 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 Headache 10/23/2011  . 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[place]) 10/23/2011  . GERD (gastroesophageal reflux disease) 10/23/2011  . Nicotine addiction 10/23/2011  . 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 Cervical stenosis of spinal canal 10/23/2011  . Essential hypertension, benign 07/10/2011   Home Medication(s) Prior to Admission medications   Medication Sig Start Date End Date Taking? Authorizing Provider  aspirin EC 81 MG tablet Take 81 mg by mouth daily.   Yes [provider]  famotidine (PEPCID) 20 MG tablet Take 1 tablet (20 mg total) by mouth at bedtime. 05/10/17  Yes Lance Sell, NP  losartan-hydrochlorothiazide (HYZAAR) 100-25 MG tablet Take 1 tablet by mouth daily. 03/12/17  Yes Burns, Claudina Lick, MD  metoprolol succinate (TOPROL-XL) 25 MG 24 hr tablet take 1 tablet by mouth once daily 02/06/17  Yes Calone, Ples Specter, FNP  nortriptyline (PAMELOR) 25 MG capsule Take 1 capsule (25 mg total) by mouth at bedtime. 11/22/16  Yes Marcial Pacas, MD  pantoprazole (PROTONIX) 40 MG tablet Take 1 tablet (40 mg total) by mouth daily. 03/12/17  Yes Burns, Claudina Lick, MD  acetaminophen (TYLENOL) 500 MG tablet Take 2 tablets (1,000 mg total) by mouth every 8 (eight) hours for 5 days. Do not take more than 4000 mg of acetaminophen (Tylenol) in a 24-hour period. Please  note that other medicines that you may be prescribed may have Tylenol as well. 05/12/17 05/17/17  Fatima Blank, MD  nitroGLYCERIN (NITROSTAT) 0.4 MG SL tablet Place 1 tablet (0.4 mg total) under the tongue every 5 (five) minutes x 3 doses as needed for chest pain. Patient not taking: Reported on 05/10/2017 01/26/17   Leonie Man, MD                                                                                                                                     Past Surgical History Past Surgical History:  Procedure Laterality Date  . CARDIAC CATHETERIZATION  January 2013   Proximal RCA 30-40%. Otherwise normal.  . CARDIAC CATHETERIZATION N/A 10/18/2015   Procedure: Left Heart Cath and Coronary Angiography;  Surgeon: Jettie Booze, MD;  Location: [facility];  Service: Cardiovascular;  Laterality: N/A;  . NM MYOVIEW LTD  March 2015   LOW RISK. No scar or ischemia. Normal EF (55%). No RWMA  . surgical excision of rectal cancer     Family History Family History  Problem Relation Age of Onset  . Hypertension Mother   . Cancer Mother   . Liver disease Father     Social History Social History   Tobacco Use  . Smoking status: Current Some Day Smoker    Types: Cigarettes  . Smokeless tobacco: Never Used  Substance Use Topics  . Alcohol use: Yes    Alcohol/week: 1.2 oz    Types: 2 Cans of beer per week  . Drug use: No   Allergies Patient has no known allergies.  Review of Systems Review of Systems  Constitutional: Negative for fever and malaise/fatigue.  Gastrointestinal: Negative for bowel incontinence, nausea and vomiting.  Genitourinary: Negative for bladder incontinence.  Musculoskeletal: Positive for back pain.  Neurological: Positive for headaches.   All other systems are reviewed and are negative for acute change except as noted in the HPI  Physical Exam Vital Signs  I have reviewed the triage vital signs BP (!) 169/88 (BP Location: Right Arm)   Pulse 66   Temp 97.7 F (36.5 C) (Oral)   Resp 16   Ht 5\' 11"  (1.803 m)   Wt 93 kg (205 lb)   SpO2 99%   BMI 28.59 kg/m   Physical Exam  Constitutional: He is oriented to person, place, and time. He appears well-developed and well-nourished. No distress.  HENT:  Head: Normocephalic and atraumatic.  Nose: Nose normal.  Eyes: Conjunctivae and  EOM are normal. Pupils are equal, round, and reactive to light. Right eye exhibits no discharge. Left eye exhibits no discharge. No scleral icterus.  Fundoscopic exam:      The right eye shows no papilledema.       The left eye shows no papilledema.  Neck: Normal range of motion. Neck supple.  Cardiovascular: Normal rate and regular rhythm. Exam reveals no  gallop and no friction rub.  No murmur heard. Pulmonary/Chest: Effort normal and breath sounds normal. No stridor. No respiratory distress. He has no rales.  Abdominal: Soft. He exhibits no distension. There is no tenderness.  Musculoskeletal: He exhibits no edema.       Lumbar back: He exhibits tenderness.       Back:  Neurological: He is alert and oriented to person, place, and time.  Mental Status:  Alert and oriented to person, place, and time.  Attention and concentration normal.  Speech clear.  Recent memory is intact  Cranial Nerves:  II Visual Fields: Intact to confrontation. Visual fields intact. III, IV, VI: Pupils equal and reactive to light and near. Full eye movement without nystagmus  V Facial Sensation: Normal. No weakness of masticatory muscles  VII: No facial weakness or asymmetry  VIII Auditory Acuity: Grossly normal  IX/X: The uvula is midline; the palate elevates symmetrically  XI: Normal sternocleidomastoid and trapezius strength  XII: The tongue is midline. No atrophy or fasciculations.   Motor System: Muscle Strength: 5/5 and symmetric in the upper and lower extremities. No pronation or drift.  Muscle Tone: Tone and muscle bulk are normal in the upper and lower extremities.   Reflexes: DTRs: 1+ and symmetrical in all four extremities. No Clonus Coordination: Intact finger-to-nose, heel-to-shin. No tremor.  Sensation: Intact to light touch, and pinprick.  Gait: Routine gait normal.   Skin: Skin is warm and dry. No rash noted. He is not diaphoretic. No erythema.  Psychiatric: He has a normal mood and  affect.  Vitals reviewed.   ED Results and Treatments Labs (all labs ordered are listed, but only abnormal results are displayed) Labs Reviewed  I-STAT CHEM 8, ED - Abnormal; Notable for the following components:      Result Value   Chloride 98 (*)    BUN 5 (*)    Glucose, Bld 123 (*)    Calcium, Ion 1.04 (*)    Hemoglobin 18.0 (*)    HCT 53.0 (*)    All other components within normal limits                                                                                                                         EKG  EKG Interpretation  Date/Time:    Ventricular Rate:    PR Interval:    QRS Duration:   QT Interval:    QTC Calculation:   R Axis:     Text Interpretation:        Radiology Dg Lumbar Spine 2-3 Views  Result Date: 05/12/2017 CLINICAL DATA:  Patient reports intermittent headache that radiates to his lower back pain for 2 weeks unrelieved by OTC Tylenol . pt denies any trauma or injury to lower back. EXAM: LUMBAR SPINE - 2-3 VIEW COMPARISON:  None. FINDINGS: Bulky osteophytosis of the lumbar spine without acute loss vertebral body height and disc height. Atherosclerotic calcification of the aorta. IMPRESSION: No acute findings of  the lumbar spine. Bulky osteophytosis. Electronically Signed   By: Suzy Bouchard M.D.   On: 05/12/2017 10:22   Ct Head W Or Wo Contrast  Result Date: 05/12/2017 CLINICAL DATA:  Intermittent headache for 2 weeks. History TIA. History of rectal abscess. EXAM: CT HEAD WITHOUT AND WITH CONTRAST TECHNIQUE: Contiguous axial images were obtained from the base of the skull through the vertex without and with intravenous contrast CONTRAST:  64mL ISOVUE-300 IOPAMIDOL (ISOVUE-300) INJECTION 61% COMPARISON:  07/10/2016. FINDINGS: Brain: No evidence of acute infarction, hemorrhage, hydrocephalus, extra-axial collection or mass lesion/mass effect. Normal for age cerebral volume. Slight hypoattenuation of white matter, likely small vessel disease. Post  infusion, no abnormal enhancement of the brain or meninges. Vascular: Calcification of the cavernous internal carotid arteries consistent with cerebrovascular atherosclerotic disease. No signs of intracranial large vessel occlusion. Skull: Calvarium intact. Sinuses/Orbits: No layering sinus fluid. BILATERAL cataract extraction. Other: None. IMPRESSION: Atrophy and small vessel disease. No acute intracranial findings. No abnormal postcontrast enhancement. Intracranial atherosclerotic disease. Electronically Signed   By: Staci Righter M.D.   On: 05/12/2017 11:28   Pertinent labs & imaging results that were available during my care of the patient were reviewed by me and considered in my medical decision making (see chart for details).  Medications Ordered in ED Medications  acetaminophen (TYLENOL) tablet 1,000 mg (1,000 mg Oral Given 05/12/17 0946)  iopamidol (ISOVUE-300) 61 % injection (75 mLs  Contrast Given 05/12/17 1056)                                                                                                                                    Procedures Procedures  (including critical care time)  Medical Decision Making / ED Course I have reviewed the nursing notes for this encounter and the patient's prior records (if available in EHR or on provided paperwork).    1. Headache Typical headache for the pt. Non focal neuro exam. No recent head trauma. No fever. Doubt meningitis. Doubt intracranial bleed. Doubt IIH.  Patient does have a history of colon cancer.  CT of the brain was obtained which did not reveal any enhancing lesions concerning for metastatic disease.  No other acute findings.  Patient given thousand milligrams of Tylenol which completely resolved the patient's headache.   2. LBP 76 y.o. male presents with back pain in lumbar area. No acute traumatic onset. No red flag symptoms of fever, weight loss, saddle anesthesia, weakness, fecal/urinary incontinence or urinary  retention.   Given his history of colon cancer, plain film was obtained which did not reveal any evidence of bony lesions.  Suspect MSK etiology. No indication for imaging emergently. Patient was recommended to take short course of scheduled NSAIDs and engage in early mobility as definitive treatment. Return precautions discussed for worsening or new concerning symptoms.   The patient appears reasonably screened and/or stabilized for discharge and I doubt any other medical condition or other  EMC requiring further screening, evaluation, or treatment in the ED at this time prior to discharge.  The patient is safe for discharge with strict return precautions.  Final Clinical Impression(s) / ED Diagnoses Final diagnoses:  Bad headache  Chronic bilateral low back pain with right-sided sciatica   Disposition: Discharge  Condition: Good  I have discussed the results, Dx and Tx plan with the patient who expressed understanding and agree(s) with the plan. Discharge instructions discussed at great length. The patient was given strict return precautions who verbalized understanding of the instructions. No further questions at time of discharge.    ED Discharge Orders        Ordered    acetaminophen (TYLENOL) 500 MG tablet  Every 8 hours     05/12/17 1158       Follow Up: Lance Sell, NP [place] [address] [phone]  Schedule an appointment as soon as possible for a visit  If symptoms do not improve or  worsen      This chart was dictated using voice recognition software.  Despite best efforts to proofread,  errors can occur which can change the documentation meaning.   Fatima Blank, MD 05/12/17 [id number]

## 2017-05-12 NOTE — Assessment & Plan Note (Signed)
It is pretty apparent that the patient is having trouble reading medication bottles and following discharge instructions. He seems unable to obtain assistance from friends and family, though he may just not be willing to ask. He seemed embarrassed when I asked him if he could read, though I told him I simply wanted to find help for him to be able to take his daily medications and take care of himself. I will place referral to [facility] to see if we can get him some help at home, as I think daily medication compliance would probably improve most of his medical conditions. - AMB Referral to [place] Management

## 2017-05-12 NOTE — ED Notes (Signed)
Pt returned from CT. Alert at this time.

## 2017-05-12 NOTE — Assessment & Plan Note (Signed)
Maintained on toprol XL, hyzaar, which he has not been taking correctly at home. BP readings remain elevated We spent some time going through his medications again and we provided daily pill boxes for him in office today. I also instructed him to come by the office any time he needs help with medications, as our nurses are always able to help him fill his pill boxes and talk to him about his medication schedule. He will continue toprol XL and hyzaar daily and return in 2 weeks for BP follow up.

## 2017-05-14 ENCOUNTER — Other Ambulatory Visit: Payer: Self-pay | Admitting: *Deleted

## 2017-05-14 NOTE — Patient Outreach (Signed)
[place] [facility]) Care Management  05/14/2017  Andre [name] 18-Jun-1940 [id number]   Referral received from telephonic care manager requesting home evaluation/assessment of care needs for management of chronic medical conditions.  Per chart, he has history of hypertension, COPD, GERD, rectal cancer, diabetes (last A1C - 8.1), and low literacy level.  Call placed to member, identity verified.  This care manager introduced self and purpose of call.  He state that he does need help as he has been having trouble managing his care.  He has been to the ED twice in the past week (11/30 & 12/1) for headache and back pain, left the ED on 11/30 without being seen.  He state his head is better, still experiencing back pain.  Report he will try icy hot and a heating pad to relieve the pain.  He state he would like to work on improving his hypertension, does not have a machine for regular monitoring.  Agrees to home visit next week.  Provided with contact information for this care manager, advised to contact for concerns.  [name], MSN, RN [facility] Care Management  [facility] Manager [phone]

## 2017-05-15 ENCOUNTER — Other Ambulatory Visit: Payer: Self-pay | Admitting: Nurse Practitioner

## 2017-05-15 DIAGNOSIS — N3000 Acute cystitis without hematuria: Secondary | ICD-10-CM

## 2017-05-15 MED ORDER — CEPHALEXIN 500 MG PO CAPS: 500.0000 mg | ORAL_CAPSULE | Freq: Two times a day (BID) | ORAL | 0 refills | Status: DC

## 2017-05-15 NOTE — Progress Notes (Signed)
Result note 

## 2017-05-15 NOTE — Progress Notes (Signed)
Results note sent.

## 2017-05-23 ENCOUNTER — Ambulatory Visit: Payer: Self-pay | Admitting: *Deleted

## 2017-05-23 ENCOUNTER — Other Ambulatory Visit: Payer: Self-pay | Admitting: *Deleted

## 2017-05-23 NOTE — Patient Outreach (Signed)
[place] [facility]) Care Management  05/23/2017  Tildon Silveria 12-05-1940 [id number]   Initial home visit scheduled for this afternoon.  Call placed to member prior to traveling to determine condition of roads near his home.  He report that his street and others surrounding remain unsafe to travel.  Denies any urgent concerns at this time, visit rescheduled for next week.  [name], MSN, RN [facility] Care Management  [facility] Manager [phone]

## 2017-05-31 ENCOUNTER — Other Ambulatory Visit: Payer: Self-pay | Admitting: *Deleted

## 2017-05-31 ENCOUNTER — Ambulatory Visit: Payer: Self-pay | Admitting: *Deleted

## 2017-05-31 NOTE — Patient Outreach (Signed)
[facility]) Care Management  05/31/2017  Andre [name] Aug 07, 1940 [id number]   Call placed to member to confirmed home visit for this afternoon, however he request to reschedule due to being on his way to a dentist appointment.  Home visit scheduled for next week.  Denies urgent concerns.  [name], [place], MSN [place] [phone]

## 2017-06-01 ENCOUNTER — Encounter: Payer: Self-pay | Admitting: Nurse Practitioner

## 2017-06-01 ENCOUNTER — Ambulatory Visit (INDEPENDENT_AMBULATORY_CARE_PROVIDER_SITE_OTHER): Payer: Medicare Other | Admitting: Nurse Practitioner

## 2017-06-01 VITALS — BP 142/68 | HR 82 | Temp 98.6°F | Resp 16 | Ht 71.0 in | Wt 205.0 lb

## 2017-06-01 DIAGNOSIS — R410 Disorientation, unspecified: Secondary | ICD-10-CM | POA: Diagnosis not present

## 2017-06-01 DIAGNOSIS — I7090 Unspecified atherosclerosis: Secondary | ICD-10-CM

## 2017-06-01 DIAGNOSIS — I10 Essential (primary) hypertension: Secondary | ICD-10-CM

## 2017-06-01 DIAGNOSIS — R51 Headache: Secondary | ICD-10-CM | POA: Diagnosis not present

## 2017-06-01 DIAGNOSIS — M545 Low back pain, unspecified: Secondary | ICD-10-CM

## 2017-06-01 DIAGNOSIS — G8929 Other chronic pain: Secondary | ICD-10-CM

## 2017-06-01 NOTE — Progress Notes (Signed)
Subjective:    Patient ID: Andre [name], male    DOB: [birth year], 76 y.o.   MRN: [id number]  HPI Andre [name] is a 76 yo male who presents today for a follow up visit of high blood pressure.  He is maintained on hyzaar and toprol, which he has not been compliant with in the past. He admits to difficulty reading due to low level of literacy. He also says he has lost his reading glasses. He has not been to ophthalmologist in many years. He has been placing X marks on his blood pressure medication bottles to ensure he takes them daily since our last visit, when we spent extensive time looking at each of his medications.  BP Readings from Last 3 Encounters:  06/01/17 (!) 142/68  05/12/17 (!) 166/99  05/11/17 (!) 150/103    Headaches- This is a recurrent complaint. The headaches are Intermittent  The headaches are a dull pain in the Front of his head bilaterally He reports difficulty seeing, which has not changed, since he lost his reading glasses. Denies nausea and vomiting, weakness, syncope, head inhjury. He was seen in the ER on 05/12/17 with a head  CT scan showing intracranial atherosclerotic disease, atrophy and small vessel disease with no acute findings. He does take tylenol for the headaches with some relief.  Back pain-  This is a continued problem since his last visit. He was referred to PT but has not been called to schedule yet. He says the pain has not changed since our last visit. He continues to ride his stationary bike without improvement. He went to the ER for the pain on 12/1 and a L-spine xray was obtained showing bulky osteophytosis with no acute findings.  Review of Systems  See HPI  Past Medical History:  Diagnosis Date  . Arthritis   . Coronary artery disease, non-occlusive    a. 06/2011: cath for abnormal exercise echo: Only 30-40% proximal RCA. Otherwise normal. b. 10/2015: cath for abnormal NST and atypical CP --> showed mild 30-40% stenosis in the  prox-distal RCA  . GERD (gastroesophageal reflux disease)   . HA (headache)   . Hypertension   . 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[place])   . TIA (transient ischemic attack)   . Weakness      Social History   Socioeconomic History  . Marital status: Single    Spouse name: Not on file  . Number of children: 11  . Years of education: 46  . Highest education level: Not on file  Social Needs  . Financial resource strain: Not on file  . Food insecurity - worry: Not on file  . Food insecurity - inability: Not on file  . Transportation needs - medical: Not on file  . Transportation needs - non-medical: Not on file  Occupational History  . Occupation: Retried    Comment: retired  Tobacco Use  . Smoking status: Current Some Day Smoker    Types: Cigarettes  . Smokeless tobacco: Never Used  Substance and Sexual Activity  . Alcohol use: Yes    Alcohol/week: 1.2 oz    Types: 2 Cans of beer per week  . Drug use: No  . Sexual activity: Not on file  Other Topics Concern  . Not on file  Social History Narrative   Patient is single, retired   Patient is right handed   Education level is high school   Caffeine consumption is 1 cup daily    Past Surgical History:  Procedure Laterality Date  .  CARDIAC CATHETERIZATION  January 2013   Proximal RCA 30-40%. Otherwise normal.  . CARDIAC CATHETERIZATION N/A 10/18/2015   Procedure: Left Heart Cath and Coronary Angiography;  Surgeon: Jettie Booze, MD;  Location: [facility];  Service: Cardiovascular;  Laterality: N/A;  . NM MYOVIEW LTD  March 2015   LOW RISK. No scar or ischemia. Normal EF (55%). No RWMA  . surgical excision of rectal cancer      Family History  Problem Relation Age of Onset  . Hypertension Mother   . Cancer Mother   . Liver disease Father     No Known Allergies  Current Outpatient Medications on File Prior to Visit  Medication Sig Dispense Refill  . aspirin EC 81 MG tablet Take 81 mg by mouth daily.    . famotidine  (PEPCID) 20 MG tablet Take 1 tablet (20 mg total) by mouth at bedtime. 30 tablet 2  . losartan-hydrochlorothiazide (HYZAAR) 100-25 MG tablet Take 1 tablet by mouth daily. 90 tablet 3  . metoprolol succinate (TOPROL-XL) 25 MG 24 hr tablet take 1 tablet by mouth once daily 90 tablet 3  . pantoprazole (PROTONIX) 40 MG tablet Take 1 tablet (40 mg total) by mouth daily. 90 tablet 1  . nitroGLYCERIN (NITROSTAT) 0.4 MG SL tablet Place 1 tablet (0.4 mg total) under the tongue every 5 (five) minutes x 3 doses as needed for chest pain. (Patient not taking: Reported on 05/10/2017) 25 tablet 0  . nortriptyline (PAMELOR) 25 MG capsule Take 1 capsule (25 mg total) by mouth at bedtime. (Patient not taking: Reported on 06/01/2017) 90 capsule 4  . [DISCONTINUED] chlorthalidone (HYGROTON) 25 MG tablet Take 25 mg by mouth daily.     No current facility-administered medications on file prior to visit.     BP (!) 142/68 (BP Location: Left Arm, Patient Position: Sitting, Cuff Size: Large)   Pulse 82   Temp 98.6 F (37 C) (Oral)   Resp 16   Ht 5\' 11"  (1.803 m)   Wt 205 lb (93 kg)   SpO2 96%   BMI 28.59 kg/m        Objective:   Physical Exam  Constitutional: He is oriented to person, place, and time. He appears well-developed and well-nourished. No distress.  HENT:  Head: Normocephalic and atraumatic.  Eyes: EOM and lids are normal. Pupils are equal, round, and reactive to light.  Arcus senilis bilaterally.   Cardiovascular: Normal rate, regular rhythm, normal heart sounds and intact distal pulses.  Pulmonary/Chest: Effort normal and breath sounds normal.  Neurological: He is alert and oriented to person, place, and time. He has normal strength. Coordination and gait normal. GCS eye subscore is 4. GCS verbal subscore is 5. GCS motor subscore is 6.  Skin: Skin is warm and dry.  Psychiatric: He has a normal mood and affect. Thought content normal.     Visual Acuity Screening   Right eye Left eye Both  eyes  Without correction: 20/30 20/25 20/20   With correction:          Assessment & Plan:  RTC in 1 month for F/u of BP and other chronic issues, address ongoing needs  Mental confusion He continues to return for follow up visits with same complaints and questions. He is clearly confused about his discharge instructions and home medications. During our last visit, we talked about each of his dialy medication, proper dosage, and supplied daily pill boxes for him, which has seemed to improve his compliance with  BP med regimen. It is clear that he needs some assistance at home to manage his medical problems. On past visit, he was going to reach out for help from his son but this does not seem to have happened. I am not sure if he is afraid to ask for help or if his son did not respond. He would benefit greatly from help in his home. He did say he thinks [facility] is coming to see him at some point but he had to reschedule his initial appointment. Referrals ordered: - Ambulatory referral to [place]

## 2017-06-01 NOTE — Patient Instructions (Addendum)
For your blood pressure, continue to take your hyzaar and toprol daily.  I have placed a referral to ophthalmology. Our office will call you to schedule this appointment. You should hear from our office in 7-10 days. If you can not afford this appointment, you could also go to [facility] for a vision exam and glasses.  I have placed a referral to home health . Our office will call you to schedule this appointment. You should hear from our office in 7-10 days. They will help [place] get a nurse, social worker and physical therapist to your house to help you take your daily medications and work on your back pain.  Id like to see you you back in about 1 month, to see how you are doing.

## 2017-06-03 DIAGNOSIS — I7090 Unspecified atherosclerosis: Secondary | ICD-10-CM | POA: Insufficient documentation

## 2017-06-03 NOTE — Assessment & Plan Note (Signed)
Recent CT head with no acute findings. I suspect his elevated BP and difficulty seeing without glasses may be contributing to his headaches. He has also stopped taking nortriptyline, which he was prescribed in the past for tension type headaches. As noted, he has lots of difficulty complying with daily medications and we are actively working on home health referral to improve his compliance. I do not see that he could follow a new medication regimen if started at this time. We will continue to work oN BP control and refer to ophthalmology for vision care. Referrals ordered: - Ambulatory referral to Ophthalmology If no improvement in headaches with vision correction, we may need to consider neurology referral.

## 2017-06-03 NOTE — Assessment & Plan Note (Addendum)
Seen on CT Scan of his head on 12/1. He is maintained on a daily aspirin, although he may not be taking this medicaiton We are currently working on home health assistance to improve medication compliance, as he is having tremendous difficulty following daily medication regimen at home. He does admit to low literacy and also during todays visit says he has been without his reading glasses for some time. We need to gain better control of his A1c and cholesterol but he will not likely comply with new medications if ordered. I am hoping that home health will offer some assistance with daily medications, and once we get this resource we should discuss possibly adding medications for diabetes and cholesterol.

## 2017-06-03 NOTE — Assessment & Plan Note (Signed)
Multiple visits to our office and ED for this complaint. He will likely benefit from PT. He is willing to complete PT but has not heard from them. Referrals ordered: - Ambulatory referral to [place]

## 2017-06-03 NOTE — Assessment & Plan Note (Signed)
BP appears to be improving with medication compliance. Does c/o continued headaches and poor vision. Continue current medications. - Ambulatory referral to [place] - Ambulatory referral to Ophthalmology He was instructed [facility] exam and vision correction if he can not afford ophthalmology visit.

## 2017-06-08 ENCOUNTER — Telehealth: Payer: Self-pay | Admitting: Nurse Practitioner

## 2017-06-08 ENCOUNTER — Other Ambulatory Visit: Payer: Self-pay | Admitting: *Deleted

## 2017-06-08 NOTE — Telephone Encounter (Signed)
Copied from [place] [phone]. Topic: Quick Communication - See Telephone Encounter >> Jun 08, 2017  4:51 PM Cleaster Corin, NT wrote: CRM for notification. See Telephone encounter for:   06/08/17.Beth calling from [facility] to see if its ok for pt. To start Jan. 3rd for  (nursing pt and social work) beth can be reached at [phone]

## 2017-06-08 NOTE — Patient Outreach (Signed)
[place] [facility]) Care Management  06/08/2017  Andre [name] 08/10/1940 [id number]   Call placed to member twice earlier this morning to confirm he will be available for home visit scheduled for this afternoon as this is the 3rd time this care manager has had visit scheduled.  No answer for both outreach attempts, HIPAA compliant voice message left.  Proceeded with home visit, no answer with multiple knocks on the door.  Will follow up within the next week if no call back.  [name], [place], MSN [place] [phone]

## 2017-06-11 ENCOUNTER — Other Ambulatory Visit: Payer: Self-pay | Admitting: *Deleted

## 2017-06-11 NOTE — Telephone Encounter (Signed)
Called Beth back with [facility] and gave verbal orders for PT per [place].

## 2017-06-11 NOTE — Patient Outreach (Signed)
[place] [facility]) Care Management  06/11/2017  Andre [name] 1940-09-20 [id number]   Call placed to follow up on missed home visit last week, no answer.  HIPAA compliant voice message left.  Will await call back, if no call back will follow up next week.  [name], [place], MSN [place] [phone]

## 2017-06-14 ENCOUNTER — Telehealth: Payer: Self-pay | Admitting: Nurse Practitioner

## 2017-06-14 NOTE — Telephone Encounter (Signed)
Can we please call Andre [name] to find out why he refused these services? We are having a lot of trouble managing his chronic problems and I was hoping that bringing someone into his house to help him with his daily medications and other medical needs could help [place] get his blood presssure, headaches and back pain under control.

## 2017-06-14 NOTE — Telephone Encounter (Signed)
Copied from [place] #[id number]. Topic: Quick Communication - See Telephone Encounter >> Jun 14, 2017 11:41 AM Ahmed Prima L wrote: CRM for notification. See Telephone encounter for:   06/14/17.  Denise RN from [facility] said that the pt is refusing nursing services and home care services. Call back is [phone]

## 2017-06-15 NOTE — Telephone Encounter (Signed)
LVM with message below. Asked him to return my call to discuss.

## 2017-06-18 ENCOUNTER — Encounter: Payer: Self-pay | Admitting: *Deleted

## 2017-06-18 ENCOUNTER — Other Ambulatory Visit: Payer: Self-pay | Admitting: *Deleted

## 2017-06-18 NOTE — Patient Outreach (Signed)
[place] [facility]) Care Management  06/18/2017  Andre [name] 09-Oct-1940 [id number]   3rd consecutive unsuccessful outreach attempt made to follow up on member's current health status and reschedule home visit.  HIPAA compliant voice message left.  Will await call back.  Will send outreach letter, if no response in 10 business days will close case.  [name], [place], MSN [place] [phone]

## 2017-06-19 NOTE — Telephone Encounter (Signed)
Tried reaching out to patient again. Phone was off.

## 2017-07-03 ENCOUNTER — Ambulatory Visit: Payer: Medicare Other | Admitting: Nurse Practitioner

## 2017-07-03 ENCOUNTER — Other Ambulatory Visit: Payer: Self-pay | Admitting: *Deleted

## 2017-07-03 ENCOUNTER — Encounter: Payer: Self-pay | Admitting: *Deleted

## 2017-07-03 DIAGNOSIS — Z0289 Encounter for other administrative examinations: Secondary | ICD-10-CM

## 2017-07-03 NOTE — Patient Outreach (Signed)
[place] [facility]) Care Management  07/03/2017  Andre [name] 1940-10-12 [id number]   Outreach letter sent to member on 1/7, no response.  Will close case.  Will notify care management assistant, will send case closure letters to primary MD and member.  [name], [place], MSN [place] [phone]

## 2017-07-11 ENCOUNTER — Ambulatory Visit: Payer: Medicare Other | Admitting: Interventional Cardiology

## 2017-07-18 ENCOUNTER — Telehealth: Payer: Self-pay

## 2017-07-18 NOTE — Telephone Encounter (Signed)
You can try to schedule pt for the next same day appointment.             Copied from [place]. Topic: Appointment Scheduling - Scheduling Inquiry for Clinic >> Jul 18, 2017 10:59 AM Conception Chancy, NT wrote: Patient missed his 1 month follow up on 07/03/17 and is trying to reschedule appt. No availability until May. Please contact patient to schedule. >> Jul 18, 2017 11:07 AM Para Skeans A wrote: He No Showed the 1 month FU. [facility] how would you like to handle this? Would you like for him to wait until she has an open spot in May or use a Same Day spot?

## 2017-07-18 NOTE — Telephone Encounter (Signed)
Appointment has been set up on Friday 2/8

## 2017-07-20 ENCOUNTER — Encounter: Payer: Self-pay | Admitting: Nurse Practitioner

## 2017-07-20 ENCOUNTER — Ambulatory Visit (INDEPENDENT_AMBULATORY_CARE_PROVIDER_SITE_OTHER): Payer: Medicare Other | Admitting: Nurse Practitioner

## 2017-07-20 VITALS — BP 120/62 | HR 74 | Temp 98.0°F | Resp 16 | Ht 71.0 in | Wt 204.0 lb

## 2017-07-20 DIAGNOSIS — M545 Low back pain, unspecified: Secondary | ICD-10-CM

## 2017-07-20 DIAGNOSIS — R519 Headache, unspecified: Secondary | ICD-10-CM

## 2017-07-20 DIAGNOSIS — I7090 Unspecified atherosclerosis: Secondary | ICD-10-CM

## 2017-07-20 DIAGNOSIS — R413 Other amnesia: Secondary | ICD-10-CM | POA: Diagnosis not present

## 2017-07-20 DIAGNOSIS — G8929 Other chronic pain: Secondary | ICD-10-CM

## 2017-07-20 DIAGNOSIS — R739 Hyperglycemia, unspecified: Secondary | ICD-10-CM

## 2017-07-20 DIAGNOSIS — I10 Essential (primary) hypertension: Secondary | ICD-10-CM

## 2017-07-20 DIAGNOSIS — R51 Headache: Secondary | ICD-10-CM | POA: Diagnosis not present

## 2017-07-20 NOTE — Assessment & Plan Note (Signed)
He has also stopped taking nortriptyline, which he was prescribed in the past for tension type headaches. He has lots of difficulty complying with daily medications. I do not see that he could follow a new medication regimen if started at this time.  -Ambulatory referral to Neurology

## 2017-07-20 NOTE — Patient Instructions (Signed)
Please head downstairs for lab work.  Please schedule a follow up with Dr Tamala Julian for your back pain Please schedule a follow up with me in about 1 month for routine follow up  I have placed a referral to neurology for your headaches and memory. Our office will call you to schedule this appointment. You should hear from our office in 7-10 days.  It was good to see you. Thanks for letting me take care of you today :)

## 2017-07-20 NOTE — Assessment & Plan Note (Signed)
Stable, continue current medications - Comprehensive metabolic panel; Future - CBC with Differential/Platelet; Future - Lipid panel; Future

## 2017-07-20 NOTE — Assessment & Plan Note (Signed)
He will follow back up with Dr Tamala Julian

## 2017-07-20 NOTE — Assessment & Plan Note (Signed)
This was seen on ED CT scan on 12/1. 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 Would like for him to see neurology for memory difficulty and continued headaches, he agrees to referral - Ambulatory referral to Neurology - Lipid panel; Future - Hemoglobin A1c; Future

## 2017-07-20 NOTE — Progress Notes (Signed)
Name: Andre [name]   MRN: [id number]    DOB: Feb 22, 1941   Date:07/20/2017       Progress Note  Subjective  Chief Complaint  Chief Complaint  Patient presents with  . Follow-up    HPI   Hypertension -maintained on hyzaar and toprol On past visits, it has been unclear if he was taking medications as instructed and we placed a referral to home health for medication management. He declined the referral and said that his daughters told him they could try to help him with his daily medications. Over the past 2 visits his blood pressure does seem to be improving and he says he thinks he is taking his medications correctly at home now. Denies  chest pain, shortness of breath, edema.  BP Readings from Last 3 Encounters:  07/20/17 120/62  06/01/17 (!) 142/68  05/12/17 (!) 166/99   Headaches- This is a recurrent complaint. The headaches are Intermittent  The headaches are a dull pain in the Front of his head bilaterally He reports difficulty seeing, which has not changed, since he lost his reading glasses. Denies nausea and vomiting, weakness, syncope, head injury. He was seen in the ER on 05/12/17 with a head  CT scan showing intracranial atherosclerotic disease, atrophy and small vessel disease with no acute findings. He does take tylenol for the headaches with some relief. He was instructed to follow up with ophthalmology for vision exam, but unsure if he did follow up  Back pain-continues to c/o back pain today. He describes as aching in lower back radiating down both legs. He has been doing exercises at home for pain He cancelled PT referral He went to the ER for the pain on 12/1 and a L-spine xray was obtained showing bulky osteophytosis with no acute findings.- he does not remember getting an xray. It looks like he actually saw Dr Tamala Julian for back pain in October and was told to return for follow up but he admits today that he actually forgot about that appointment.  Patient Active  Problem List   Diagnosis Date Noted  . Atherosclerosis 06/03/2017  . Low-level of literacy 05/12/2017  . Osteoarthritis 04/20/2017  . Diabetes ([place]) 03/12/2017  . Acute non-recurrent frontal sinusitis 01/04/2017  . COPD exacerbation ([place]) 12/14/2016  . Acute upper respiratory infection 06/20/2016  . Back pain 02/29/2016  . Abnormal stress test 10/18/2015  . Erectile dysfunction 02/25/2015  . Cigarette smoker 02/10/2015  . COPD GOLD 0 11/08/2014  . Hemorrhoids 02/11/2014  . Constipation 05/30/2013  . 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 Headache 10/23/2011  . 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[place]) 10/23/2011  . GERD (gastroesophageal reflux disease) 10/23/2011  . Nicotine addiction 10/23/2011  . 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 Cervical stenosis of spinal canal 10/23/2011  . Essential hypertension, benign 07/10/2011    Past Surgical History:  Procedure Laterality Date  . CARDIAC CATHETERIZATION  January 2013   Proximal RCA 30-40%. Otherwise normal.  . CARDIAC CATHETERIZATION N/A 10/18/2015   Procedure: Left Heart Cath and Coronary Angiography;  Surgeon: Jettie Booze, MD;  Location: [facility];  Service: Cardiovascular;  Laterality: N/A;  . NM MYOVIEW LTD  March 2015   LOW RISK. No scar or ischemia. Normal EF (55%). No RWMA  . surgical excision of rectal cancer      Family History  Problem Relation Age of Onset  . Hypertension Mother   . Cancer Mother   . Liver disease Father     Social History  Socioeconomic History  . Marital status: Single    Spouse name: Not on file  . Number of children: 11  . Years of education: 49  . Highest education level: Not on file  Social Needs  . Financial resource strain: Not on file  . Food insecurity - worry: Not on file  . Food insecurity - inability: Not on file  . Transportation needs - medical: Not on file  . Transportation needs - non-medical: Not on file  Occupational History  . Occupation: Retried    Comment:  retired  Tobacco Use  . Smoking status: Current Some Day Smoker    Types: Cigarettes  . Smokeless tobacco: Never Used  Substance and Sexual Activity  . Alcohol use: Yes    Alcohol/week: 1.2 oz    Types: 2 Cans of beer per week  . Drug use: No  . Sexual activity: Not on file  Other Topics Concern  . Not on file  Social History Narrative   Patient is single, retired   Patient is right handed   Education level is high school   Caffeine consumption is 1 cup daily     Current Outpatient Medications:  .  aspirin EC 81 MG tablet, Take 81 mg by mouth daily., Disp: , Rfl:  .  famotidine (PEPCID) 20 MG tablet, Take 1 tablet (20 mg total) by mouth at bedtime., Disp: 30 tablet, Rfl: 2 .  losartan-hydrochlorothiazide (HYZAAR) 100-25 MG tablet, Take 1 tablet by mouth daily., Disp: 90 tablet, Rfl: 3 .  metoprolol succinate (TOPROL-XL) 25 MG 24 hr tablet, take 1 tablet by mouth once daily, Disp: 90 tablet, Rfl: 3 .  nitroGLYCERIN (NITROSTAT) 0.4 MG SL tablet, Place 1 tablet (0.4 mg total) under the tongue every 5 (five) minutes x 3 doses as needed for chest pain., Disp: 25 tablet, Rfl: 0 .  nortriptyline (PAMELOR) 25 MG capsule, Take 1 capsule (25 mg total) by mouth at bedtime., Disp: 90 capsule, Rfl: 4 .  pantoprazole (PROTONIX) 40 MG tablet, Take 1 tablet (40 mg total) by mouth daily., Disp: 90 tablet, Rfl: 1  No Known Allergies   ROS See HPI  Objective  Vitals:   07/20/17 1258  BP: 120/62  Pulse: 74  Resp: 16  Temp: 98 F (36.7 C)  TempSrc: Oral  SpO2: 96%  Weight: 204 lb (92.5 kg)  Height: 5\' 11"  (1.803 m)   Body mass index is 28.45 kg/m.  Physical Exam Constitutional: Patient appears well-developed and well-nourished. No distress.  HENT: Head: Normocephalic and atraumatic. Nose: Nose normal. Mouth/Throat: Oropharynx is clear and moist. No oropharyngeal exudate.  Eyes: Conjunctivae and EOM are normal. Pupils are equal, round, and reactive to light. No scleral icterus.   Neck: Normal range of motion. Neck supple. Cardiovascular: Normal rate, regular rhythm and normal heart sounds.  No BLE edema. Distal pulses intact. Pulmonary/Chest: Effort normal and breath sounds normal. No respiratory distress. Abdominal: Soft. No distension. Musculoskeletal: Normal range of motion, no joint effusions. No gross deformities Neurological: He is alert and oriented to person, place, and time. Coordination, balance, strength, speech and gait are normal.  Skin: Skin is warm and dry. No rash noted. No erythema.  Psychiatric: Patient has a normal mood and affect. His has abnormal memory  PHQ2/9: Depression screen [facility] 2/9 05/10/2017 03/12/2017 07/11/2016 11/03/2015 09/13/2015  Decreased Interest 0 0 0 0 0  Down, Depressed, Hopeless 1 0 0 0 0  PHQ - 2 Score 1 0 0 0 0  Some recent data  might be hidden    Assessment & Plan RTC in about 1 month for follow up of chronic conditions  Memory loss After spending a few visits with Mr Feutz, it has become clear that he has memory loss confirmed today by his being unable to remember past appointments and tests, and confusion about medications and medical conditions. We discussed seeing a neurologist for further evaluation of his memory and he agrees to go - Ambulatory referral to Neurology   Elevated blood sugar - Comprehensive metabolic panel; Future - Hemoglobin A1c; Future

## 2017-07-23 ENCOUNTER — Encounter: Payer: Self-pay | Admitting: Neurology

## 2017-08-01 NOTE — Progress Notes (Signed)
[name] [address] [place], [place] [address] Phone: [phone] Subjective:    I'm seeing this patient by the request  of:    CC: Back pain  SNK:[id number]  Andre [name] is a 77 y.o. male coming in with complaint of back pain. He is having pain on the left side of lumbar spine that radiates down into the posterior aspect of the leg to the ankle. Pain is worse when seated. He has used some topical analgesic.    Patient has had x-ray showing that patient did these were independently visualized by me.  Patient was to be taking nortriptyline but has been noncompliant, also has not been doing the exercises.    Past Medical History:  Diagnosis Date  . Arthritis   . Coronary artery disease, non-occlusive    a. 06/2011: cath for abnormal exercise echo: Only 30-40% proximal RCA. Otherwise normal. b. 10/2015: cath for abnormal NST and atypical CP --> showed mild 30-40% stenosis in the prox-distal RCA  . GERD (gastroesophageal reflux disease)   . HA (headache)   . Hypertension   . 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[place])   . TIA (transient ischemic attack)   . Weakness    Past Surgical History:  Procedure Laterality Date  . CARDIAC CATHETERIZATION  January 2013   Proximal RCA 30-40%. Otherwise normal.  . CARDIAC CATHETERIZATION N/A 10/18/2015   Procedure: Left Heart Cath and Coronary Angiography;  Surgeon: Jettie Booze, MD;  Location: [facility];  Service: Cardiovascular;  Laterality: N/A;  . NM MYOVIEW LTD  March 2015   LOW RISK. No scar or ischemia. Normal EF (55%). No RWMA  . surgical excision of rectal cancer     Social History   Socioeconomic History  . Marital status: Single    Spouse name: Not on file  . Number of children: 11  . Years of education: 33  . Highest education level: Not on file  Social Needs  . Financial resource strain: Not on file  . Food insecurity - worry: Not on file  . Food insecurity - inability: Not on file  .  Transportation needs - medical: Not on file  . Transportation needs - non-medical: Not on file  Occupational History  . Occupation: Retried    Comment: retired  Tobacco Use  . Smoking status: Current Some Day Smoker    Types: Cigarettes  . Smokeless tobacco: Never Used  Substance and Sexual Activity  . Alcohol use: Yes    Alcohol/week: 1.2 oz    Types: 2 Cans of beer per week  . Drug use: No  . Sexual activity: Not on file  Other Topics Concern  . Not on file  Social History Narrative   Patient is single, retired   Patient is right handed   Education level is high school   Caffeine consumption is 1 cup daily   No Known Allergies Family History  Problem Relation Age of Onset  . Hypertension Mother   . Cancer Mother   . Liver disease Father      Past medical history, social, surgical and family history all reviewed in electronic medical record.  No pertanent information unless stated regarding to the chief complaint.   Review of Systems:Review of systems updated and as accurate as of 08/01/17  No headache, visual changes, nausea, vomiting, diarrhea, constipation, dizziness, abdominal pain, skin rash, fevers, chills, night sweats, weight loss, swollen lymph nodes, , chest pain, shortness of breath, mood changes.  Positive  body aches, muscle aches  Objective  There were no vitals taken for this visit. Systems examined below as of 08/01/17   General: No apparent distress alert and oriented x3 mood and affect normal, dressed appropriately.  HEENT: Pupils equal, extraocular movements intact  Respiratory: Patient's speak in full sentences and does not appear short of breath  Cardiovascular: No lower extremity edema, non tender, no erythema  Skin: Warm dry intact with no signs of infection or rash on extremities or on axial skeleton.  Abdomen: Soft nontender  Neuro: Cranial nerves II through XII are intact, neurovascularly intact in all extremities with 2+ DTRs and 2+ pulses.    Lymph: No lymphadenopathy of posterior or anterior cervical chain or axillae bilaterally.  Gait normal with good balance and coordination.  MSK:  Non tender with full range of motion and good stability and symmetric strength and tone of shoulders, elbows, wrist, hip, knee and ankles bilaterally.  Arthritic changes of multiple joints Back exam shows the patient does have significant tightness of the hamstring on the right side.  Tenderness to palpation over the paraspinal musculature of the lumbar spine left greater than right especially over L5-S1.  Patient strength is 4+ out of 5 in the feet but is symmetric.  Deep tendon reflexes are still intact.    Impression and Recommendations:     This case required medical decision making of moderate complexity.      Note: This dictation was prepared with Dragon dictation along with smaller phrase technology. Any transcriptional errors that result from this process are unintentional.

## 2017-08-02 ENCOUNTER — Encounter: Payer: Self-pay | Admitting: Interventional Cardiology

## 2017-08-02 ENCOUNTER — Ambulatory Visit (INDEPENDENT_AMBULATORY_CARE_PROVIDER_SITE_OTHER): Payer: Medicare Other | Admitting: Family Medicine

## 2017-08-02 ENCOUNTER — Encounter: Payer: Self-pay | Admitting: Family Medicine

## 2017-08-02 DIAGNOSIS — M5416 Radiculopathy, lumbar region: Secondary | ICD-10-CM | POA: Diagnosis not present

## 2017-08-02 MED ORDER — GABAPENTIN 100 MG PO CAPS: 200.0000 mg | ORAL_CAPSULE | Freq: Every day | ORAL | 3 refills | Status: DC

## 2017-08-02 NOTE — Patient Instructions (Signed)
Good to see you  Alvera Singh is your friend.  Exercises 3 times a week.  Gabapentin 1-2 pills at night If worsening pain we will need to consider MRI  See me again in 2-3 months.

## 2017-08-02 NOTE — Assessment & Plan Note (Signed)
Patient has known degenerative disc disease.  I do believe that this is likely contributing to the pain.  We discussed different treatment options and patient declined many things such as physical therapy or injections.  Patient has been noncompliant with the home exercises with the medications given previously.  Given gabapentin.  Discussed icing regimen and home exercises.  Patient will follow up with me again in 4-6 weeks

## 2017-08-14 ENCOUNTER — Ambulatory Visit: Payer: Medicare Other | Admitting: Physician Assistant

## 2017-08-14 DIAGNOSIS — R0989 Other specified symptoms and signs involving the circulatory and respiratory systems: Secondary | ICD-10-CM

## 2017-08-14 NOTE — Progress Notes (Deleted)
Cardiology Office Note    Date:  08/14/2017   ID:  Andre [name], DOB [birth year], MRN [id number]  PCP:  Lance Sell, NP  Cardiologist:  Dr. Ellyn Hack   No chief complaint on file.   History of Present Illness:  Andre [name] is a 77 y.o. male with PMH of CAD, HTN, TIA and GERD.  Cardiac catheterization in January 2013 after abnormal stress echo showed 30-40% proximal RCA disease, otherwise normal coronaries.  Repeat cardiac catheterization in May 2017 abnormal nuclear stress test and atypical chest pain showed mild 30-40% stenosis in proximal RCA.  He was last seen in the hospital in October 2017 with chest discomfort.  EKG at the time continue to show chronic T wave inversion in V1 through V3, reportedly dating back to 2012.  It was felt his chest pain was probably esophageal spasm.  Yes EKG  Past Medical History:  Diagnosis Date  . Arthritis   . Coronary artery disease, non-occlusive    a. 06/2011: cath for abnormal exercise echo: Only 30-40% proximal RCA. Otherwise normal. b. 10/2015: cath for abnormal NST and atypical CP --> showed mild 30-40% stenosis in the prox-distal RCA  . GERD (gastroesophageal reflux disease)   . HA (headache)   . Hypertension   . 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[place])   . TIA (transient ischemic attack)   . Weakness     Past Surgical History:  Procedure Laterality Date  . CARDIAC CATHETERIZATION  January 2013   Proximal RCA 30-40%. Otherwise normal.  . CARDIAC CATHETERIZATION N/A 10/18/2015   Procedure: Left Heart Cath and Coronary Angiography;  Surgeon: Jettie Booze, MD;  Location: [facility];  Service: Cardiovascular;  Laterality: N/A;  . NM MYOVIEW LTD  March 2015   LOW RISK. No scar or ischemia. Normal EF (55%). No RWMA  . surgical excision of rectal cancer      Current Medications: Outpatient Medications Prior to Visit  Medication Sig Dispense Refill  . aspirin EC 81 MG tablet Take 81 mg by mouth daily.    . famotidine (PEPCID) 20 MG tablet  Take 1 tablet (20 mg total) by mouth at bedtime. 30 tablet 2  . gabapentin (NEURONTIN) 100 MG capsule Take 2 capsules (200 mg total) by mouth at bedtime. 60 capsule 3  . losartan-hydrochlorothiazide (HYZAAR) 100-25 MG tablet Take 1 tablet by mouth daily. 90 tablet 3  . metoprolol succinate (TOPROL-XL) 25 MG 24 hr tablet take 1 tablet by mouth once daily 90 tablet 3  . nitroGLYCERIN (NITROSTAT) 0.4 MG SL tablet Place 1 tablet (0.4 mg total) under the tongue every 5 (five) minutes x 3 doses as needed for chest pain. 25 tablet 0  . pantoprazole (PROTONIX) 40 MG tablet Take 1 tablet (40 mg total) by mouth daily. 90 tablet 1   No facility-administered medications prior to visit.      Allergies:   Patient has no known allergies.   Social History   Socioeconomic History  . Marital status: Single    Spouse name: Not on file  . Number of children: 11  . Years of education: 65  . Highest education level: Not on file  Social Needs  . Financial resource strain: Not on file  . Food insecurity - worry: Not on file  . Food insecurity - inability: Not on file  . Transportation needs - medical: Not on file  . Transportation needs - non-medical: Not on file  Occupational History  . Occupation: Retried    Comment: retired  Tobacco Use  . Smoking status: Current Some Day Smoker    Types: Cigarettes  . Smokeless tobacco: Never Used  Substance and Sexual Activity  . Alcohol use: Yes    Alcohol/week: 1.2 oz    Types: 2 Cans of beer per week  . Drug use: No  . Sexual activity: Not on file  Other Topics Concern  . Not on file  Social History Narrative   Patient is single, retired   Patient is right handed   Education level is high school   Caffeine consumption is 1 cup daily     Family History:  The patient's ***family history includes Cancer in his mother; Hypertension in his mother; Liver disease in his father.   ROS:   Please see the history of present illness.    ROS All other systems  reviewed and are negative.   PHYSICAL EXAM:   VS:  There were no vitals taken for this visit.   GEN: Well nourished, well developed, in no acute distress  HEENT: normal  Neck: no JVD, carotid bruits, or masses Cardiac: ***RRR; no murmurs, rubs, or gallops,no edema  Respiratory:  clear to auscultation bilaterally, normal work of breathing GI: soft, nontender, nondistended, + BS MS: no deformity or atrophy  Skin: warm and dry, no rash Neuro:  Alert and Oriented x 3, Strength and sensation are intact Psych: euthymic mood, full affect  Wt Readings from Last 3 Encounters:  08/02/17 205 lb (93 kg)  07/20/17 204 lb (92.5 kg)  06/01/17 205 lb (93 kg)      Studies/Labs Reviewed:   EKG:  EKG is*** ordered today.  The ekg ordered today demonstrates ***  Recent Labs: 11/22/2016: TSH 0.950 03/12/2017: ALT 37; Platelets 228.0 05/12/2017: BUN 5; Creatinine, Ser 0.70; Hemoglobin 18.0; Potassium 3.8; Sodium 140   Lipid Panel    Component Value Date/Time   CHOL 147 03/20/2016 0326   TRIG 176 (H) 03/20/2016 0326   HDL 36 (L) 03/20/2016 0326   CHOLHDL 4.1 03/20/2016 0326   VLDL 35 03/20/2016 0326   LDLCALC 76 03/20/2016 0326    Additional studies/ records that were reviewed today include:    Myoview 10/15/2015 Study Highlights    Nuclear stress EF is calculated at 44% but visually appears 50% with no regional wall motion abnormalities.  The left ventricular ejection fraction is moderately decreased (30-44%).  There is a medium defect of moderate severity present in the basal inferior, mid inferior and apical inferior location. The defect is non-reversible. In the setting of normal wall motion in the inferior wall, this is consistent with diaphragmatic attenuation. No ischemia noted.  There is a small defect of moderate severity present in the basal inferolateral and mid inferolateral location. The defect is reversible. There is significant extracardiac uptake of radiotracer in the area  of the inferolateral wall. This defect could represent ischemia but also can be explained by variations in extracardiac uptake between stress and rest imaging. Clinical correlation recommended.  This is an intermediate risk study.      Cath 10/18/2015 Conclusion    Nonobstructive CAD.  Mildly elevated LVEDP.  LV was not injected due to tortuosity in the radial and subclavian.  If cath was needed in the future, would not use right radial approach.   Continue aggressive preventive therapy.        ASSESSMENT:    No diagnosis found.   PLAN:  In order of problems listed above:  1. ***    Medication Adjustments/Labs and  Tests Ordered: Current medicines are reviewed at length with the patient today.  Concerns regarding medicines are outlined above.  Medication changes, Labs and Tests ordered today are listed in the Patient Instructions below. There are no Patient Instructions on file for this visit.   Hilbert Corrigan, [place]  08/14/2017 8:09 AM    [address], [place], [place]  [address] Phone: [phone]; Fax: [phone]

## 2017-08-15 ENCOUNTER — Encounter: Payer: Self-pay | Admitting: *Deleted

## 2017-08-20 ENCOUNTER — Other Ambulatory Visit (INDEPENDENT_AMBULATORY_CARE_PROVIDER_SITE_OTHER): Payer: Medicare Other

## 2017-08-20 ENCOUNTER — Encounter: Payer: Self-pay | Admitting: Nurse Practitioner

## 2017-08-20 ENCOUNTER — Ambulatory Visit (INDEPENDENT_AMBULATORY_CARE_PROVIDER_SITE_OTHER): Payer: Medicare Other | Admitting: Nurse Practitioner

## 2017-08-20 VITALS — BP 126/78 | HR 84 | Temp 98.1°F | Resp 16 | Ht 71.0 in | Wt 207.0 lb

## 2017-08-20 DIAGNOSIS — R413 Other amnesia: Secondary | ICD-10-CM

## 2017-08-20 DIAGNOSIS — I10 Essential (primary) hypertension: Secondary | ICD-10-CM

## 2017-08-20 DIAGNOSIS — I7090 Unspecified atherosclerosis: Secondary | ICD-10-CM | POA: Diagnosis not present

## 2017-08-20 DIAGNOSIS — K2100 Gastro-esophageal reflux disease with esophagitis, without bleeding: Secondary | ICD-10-CM

## 2017-08-20 DIAGNOSIS — R739 Hyperglycemia, unspecified: Secondary | ICD-10-CM | POA: Diagnosis not present

## 2017-08-20 DIAGNOSIS — R079 Chest pain, unspecified: Secondary | ICD-10-CM

## 2017-08-20 LAB — COMPREHENSIVE METABOLIC PANEL
ALK PHOS: 79 U/L (ref 39–117)
ALT: 22 U/L (ref 0–53)
AST: 30 U/L (ref 0–37)
Albumin: 4.3 g/dL (ref 3.5–5.2)
BILIRUBIN TOTAL: 1.1 mg/dL (ref 0.2–1.2)
BUN: 5 mg/dL — ABNORMAL LOW (ref 6–23)
CO2: 30 mEq/L (ref 19–32)
CREATININE: 0.8 mg/dL (ref 0.40–1.50)
Calcium: 10 mg/dL (ref 8.4–10.5)
Chloride: 99 mEq/L (ref 96–112)
GFR: 120.99 mL/min (ref >60.00)
GLUCOSE: 149 mg/dL — AB (ref 70–99)
Potassium: 3.4 mEq/L — ABNORMAL LOW (ref 3.5–5.1)
Sodium: 139 mEq/L (ref 135–145)
TOTAL PROTEIN: 7.6 g/dL (ref 6.0–8.3)

## 2017-08-20 LAB — CBC WITH DIFFERENTIAL/PLATELET
BASOS ABS: 0 10*3/uL (ref 0.0–0.1)
Basophils Relative: 0.3 % (ref 0.0–3.0)
Eosinophils Absolute: 0.2 10*3/uL (ref 0.0–0.7)
Eosinophils Relative: 2.6 % (ref 0.0–5.0)
HCT: 48.1 % (ref 39.0–52.0)
Hemoglobin: 16.2 g/dL (ref 13.0–17.0)
LYMPHS ABS: 2.9 10*3/uL (ref 0.7–4.0)
Lymphocytes Relative: 43.1 % (ref 12.0–46.0)
MCHC: 33.7 g/dL (ref 30.0–36.0)
MCV: 95.8 fl (ref 78.0–100.0)
MONOS PCT: 8.9 % (ref 3.0–12.0)
Monocytes Absolute: 0.6 10*3/uL (ref 0.1–1.0)
NEUTROS PCT: 45.1 % (ref 43.0–77.0)
Neutro Abs: 3.1 10*3/uL (ref 1.4–7.7)
Platelets: 262 10*3/uL (ref 150.0–400.0)
RBC: 5.02 Mil/uL (ref 4.22–5.81)
RDW: 14.7 % (ref 11.5–15.5)
WBC: 6.8 10*3/uL (ref 4.0–10.5)

## 2017-08-20 LAB — LIPID PANEL
Cholesterol: 169 mg/dL (ref 0–200)
HDL: 39.5 mg/dL (ref >39.00)
LDL Cholesterol: 91 mg/dL (ref 0–99)
NONHDL: 129.96
Total CHOL/HDL Ratio: 4
Triglycerides: 194 mg/dL — ABNORMAL HIGH (ref 0.0–149.0)
VLDL: 38.8 mg/dL (ref 0.0–40.0)

## 2017-08-20 LAB — HEMOGLOBIN A1C: HEMOGLOBIN A1C: 7.3 % — AB (ref 4.6–6.5)

## 2017-08-20 NOTE — Progress Notes (Signed)
Name: Andre [name]   MRN: [id number]    DOB: [birth year]   Date:08/20/2017       Progress Note  Subjective  Chief Complaint  Chief Complaint  Patient presents with  . Follow-up    blood pressure     HPI Mr Andre [name] is here today for routine follow up of his blood pressure   Hypertension -maintained on hyzaar 100-25 and toprol 25 daily On past visits, it has been unclear if he was taking medications as instructed and we placed a referral to home health for medication management. He declined the referral and said that his daughters told him they could try to help him with his daily medications. He says his daughter now helps him with blood pressure medications and he takes them every morning as prescribed His blood pressure was initially elevated on arrival to clinic today but after several minutes of rest was recheck and WNL He does not check his blood pressure at home Denies headaches, vision changes, edema.  BP Readings from Last 3 Encounters:  08/20/17 126/82  08/02/17 116/70  07/20/17 120/62   Chest pain-this is a recurrent problem He describes as intermittent burning pain in midsternum. He says it feels like heartburn. 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 He has been seen in the ED and by me for this over past few months several times. I placed a referral to cardiology for him on 11/29 and after reviewing appointments today it looks like he no showed to cardiology twice. I also placed referral to gastroenterology and he is a patient of Dr Andre [name], told scheduling staff that he was actually scheduled for f/u with Dr Andre [name], but I cant find any further records of this appointment He again seems confused when I ask him about these appointments He denies syncope, fevers, lightheadedness, shortness of breath, cough, nausea, vomiting.   Patient Active Problem List   Diagnosis Date Noted  . Left lumbar  radiculopathy 08/02/2017  . Atherosclerosis 06/03/2017  . Low-level of literacy 05/12/2017  . Osteoarthritis 04/20/2017  . Diabetes ([place]) 03/12/2017  . Acute non-recurrent frontal sinusitis 01/04/2017  . COPD exacerbation ([place]) 12/14/2016  . Acute upper respiratory infection 06/20/2016  . Back pain 02/29/2016  . Abnormal stress test 10/18/2015  . Erectile dysfunction 02/25/2015  . Cigarette smoker 02/10/2015  . COPD GOLD 0 11/08/2014  . Hemorrhoids 02/11/2014  . Constipation 05/30/2013  . 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 Headache 10/23/2011  . 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[place]) 10/23/2011  . GERD (gastroesophageal reflux disease) 10/23/2011  . Nicotine addiction 10/23/2011  . 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 Cervical stenosis of spinal canal 10/23/2011  . Essential hypertension, benign 07/10/2011    Past Surgical History:  Procedure Laterality Date  . CARDIAC CATHETERIZATION  January 2013   Proximal RCA 30-40%. Otherwise normal.  . CARDIAC CATHETERIZATION N/A 10/18/2015   Procedure: Left Heart Cath and Coronary Angiography;  Surgeon: Jettie Booze, MD;  Location: [facility];  Service: Cardiovascular;  Laterality: N/A;  . NM MYOVIEW LTD  March 2015   LOW RISK. No scar or ischemia. Normal EF (55%). No RWMA  . surgical excision of rectal cancer      Family History  Problem Relation Age of Onset  . Hypertension Mother   . Cancer Mother   . Liver disease Father     Social History   Socioeconomic History  .  Marital status: Single    Spouse name: Not on file  . Number of children: 11  . Years of education: 64  . Highest education level: Not on file  Social Needs  . Financial resource strain: Not on file  . Food insecurity - worry: Not on file  . Food insecurity - inability: Not on file  . Transportation needs - medical: Not on file  . Transportation needs - non-medical: Not on file  Occupational History  . Occupation: Retried    Comment: retired  Tobacco  Use  . Smoking status: Current Some Day Smoker    Types: Cigarettes  . Smokeless tobacco: Never Used  Substance and Sexual Activity  . Alcohol use: Yes    Alcohol/week: 1.2 oz    Types: 2 Cans of beer per week  . Drug use: No  . Sexual activity: Not on file  Other Topics Concern  . Not on file  Social History Narrative   Patient is single, retired   Patient is right handed   Education level is high school   Caffeine consumption is 1 cup daily     Current Outpatient Medications:  .  aspirin EC 81 MG tablet, Take 81 mg by mouth daily., Disp: , Rfl:  .  famotidine (PEPCID) 20 MG tablet, Take 1 tablet (20 mg total) by mouth at bedtime., Disp: 30 tablet, Rfl: 2 .  gabapentin (NEURONTIN) 100 MG capsule, Take 2 capsules (200 mg total) by mouth at bedtime., Disp: 60 capsule, Rfl: 3 .  losartan-hydrochlorothiazide (HYZAAR) 100-25 MG tablet, Take 1 tablet by mouth daily., Disp: 90 tablet, Rfl: 3 .  metoprolol succinate (TOPROL-XL) 25 MG 24 hr tablet, take 1 tablet by mouth once daily, Disp: 90 tablet, Rfl: 3 .  nitroGLYCERIN (NITROSTAT) 0.4 MG SL tablet, Place 1 tablet (0.4 mg total) under the tongue every 5 (five) minutes x 3 doses as needed for chest pain., Disp: 25 tablet, Rfl: 0 .  pantoprazole (PROTONIX) 40 MG tablet, Take 1 tablet (40 mg total) by mouth daily., Disp: 90 tablet, Rfl: 1  No Known Allergies   ROS See HPI  Objective  Vitals:   08/20/17 0822 08/20/17 0910  BP: (!) 158/62 126/82  Pulse: 84   Resp: 16   Temp: 98.1 F (36.7 C)   TempSrc: Oral   SpO2: 96%   Weight: 207 lb (93.9 kg)   Height: 5\' 11"  (1.803 m)     Body mass index is 28.87 kg/m.  Physical Exam Vital signs reviewed. Constitutional: Patient appears well-developed and well-nourished. No distress.  HENT: Head: Normocephalic and atraumatic. Nose: Nose normal. Mouth/Throat: Oropharynx is clear and moist. Eyes: Conjunctivae and EOM are normal. Pupils are equal, round, and reactive to light. No  scleral icterus.  Neck: Normal range of motion. Neck supple. Cardiovascular: Normal rate, regular rhythm and normal heart sounds. Distal pulses intact. Pulmonary/Chest: Effort normal and breath sounds normal. No respiratory distress. Musculoskeletal: Normal range of motion, no joint effusions. No gross deformities Neurological: He is alert and oriented. Coordination, balance, strength, speech and gait are normal.  Skin: Skin is warm and dry. No rash noted. No erythema.  Psychiatric: Patient has a normal mood and affect. His has abnormal memory  PHQ2/9: Depression screen [facility] 2/9 05/10/2017 03/12/2017 07/11/2016 11/03/2015 09/13/2015  Decreased Interest 0 0 0 0 0  Down, Depressed, Hopeless 1 0 0 0 0  PHQ - 2 Score 1 0 0 0 0  Some recent data might be hidden  Fall Risk: Fall Risk  05/10/2017 03/12/2017 11/03/2015 08/28/2013  Falls in the past year? No No No No     Assessment & Plan RTC in 3 months for follow up of chronic conditions Patient was reminded to get labs drawn today, he never had labs drawn from prior OV  Chest pain, unspecified type History today indicates GERD, although I would still like for him to see cardiology based on health history and age and continued complaint of chest pain Referred to cardiology for further evaluation in November, it looks like he has no showed twice I provided the phone number for patient to call cardiology and schedule another appointment today, and highlighted this on his AVS - EKG 12-Lead:  I have personally reviewed the EKG tracing and agree with computerized printout as noted: sinus rhythm with no acute abnormalities   Elevated blood sugar - Comprehensive metabolic panel; Future - Hemoglobin A1c; Future  Memory loss Again today there is confusion about his follow up/specialty appointments and medications He declines another referral to home health and says his daughter is helping him with medications now, and his blood pressure has improved   I requested family to come to next appointment with him to see if we can get him some more help with his medical issues but he says their work schedules are too busy  He has neuro appointment on 4/24 for memory loss, I reminded him today to keep this appointment. I hope that he will make it to this appointment, well see at his next follow up with me

## 2017-08-20 NOTE — Patient Instructions (Addendum)
[facility] for follow up of your acid reflux and chest pain. I need them to help [place] with these problems.  Cardiology [phone]  Gastroenterology [phone]  Please remember your neurology appointment on 10/03/17 at 9am.  Please head downstairs for lab work.  Please return to see me in about 3 months so I can see how you are doing.

## 2017-08-20 NOTE — Assessment & Plan Note (Signed)
-   Hemoglobin A1c; Future - Lipid panel; Future

## 2017-08-20 NOTE — Assessment & Plan Note (Signed)
Appears stable, continue current medications.

## 2017-08-20 NOTE — Assessment & Plan Note (Signed)
We discussed dietary and lifestyle modifications for GERD including avoiding coffee, chocolate, onions, garlic, acidic, tomato based He says he is taking protonix but I can not be sure He will call GI Dr hung for follow up of continued GERD- number provided and highlighted on AVS

## 2017-08-27 ENCOUNTER — Other Ambulatory Visit: Payer: Self-pay | Admitting: Nurse Practitioner

## 2017-08-27 DIAGNOSIS — E785 Hyperlipidemia, unspecified: Secondary | ICD-10-CM

## 2017-08-27 MED ORDER — ATORVASTATIN CALCIUM 10 MG PO TABS: 10.0000 mg | ORAL_TABLET | Freq: Every day | ORAL | 1 refills | Status: DC

## 2017-10-03 ENCOUNTER — Encounter

## 2017-10-03 ENCOUNTER — Ambulatory Visit: Payer: Medicare Other | Admitting: Neurology

## 2017-10-04 ENCOUNTER — Ambulatory Visit (INDEPENDENT_AMBULATORY_CARE_PROVIDER_SITE_OTHER): Payer: Medicare Other | Admitting: Family Medicine

## 2017-10-04 ENCOUNTER — Encounter: Payer: Self-pay | Admitting: Family Medicine

## 2017-10-04 VITALS — BP 118/62 | HR 64 | Ht 71.0 in | Wt 198.0 lb

## 2017-10-04 DIAGNOSIS — M5416 Radiculopathy, lumbar region: Secondary | ICD-10-CM

## 2017-10-04 NOTE — Assessment & Plan Note (Signed)
Continues to have difficulty overall.  Seems to be intermittent though.  No weakness noted today.  Encourage patient to try formal physical therapy which she seems to be optimistic about.  Discussing with patient about icing regimen and home exercises.  Patient will try to do this on a more regular basis.  Once again discussed over-the-counter medications that patient has been somewhat noncompliant with.  I do believe that there is some low health literacy that makes it difficult and likely some underlying dementia.  Follow-up with me again in 6 to 8 weeks

## 2017-10-04 NOTE — Patient Instructions (Signed)
Good to see you  Andre [name] is your friend.  Gabapentin 300mg  at night now.  We will get you in with PT  Ice isa good idea before bed to the back  See me again in 6 weeks

## 2017-10-04 NOTE — Progress Notes (Signed)
[name] [address] [place], [place] [address] Phone: [phone] Subjective:   CC: Back pain follow-up  [id number]  Andre [name] is a 77 y.o. male coming in with complaint of back pain.  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  Patient was noncompliant at last follow-up and started on gabapentin.  Patient was to do home exercises and icing regimen.  Patient states that he not had relief with the gabapentin. Continues to have pain in the lower back and into his leg.  Patient states that seems to still be worse at night.  Not stopping from daily activities though too much.  Patient would like to be able to be pain-free at some point.  Patient states that he forgets sometimes to do the exercises.      Past Medical History:  Diagnosis Date  . Arthritis   . Coronary artery disease, non-occlusive    a. 06/2011: cath for abnormal exercise echo: Only 30-40% proximal RCA. Otherwise normal. b. 10/2015: cath for abnormal NST and atypical CP --> showed mild 30-40% stenosis in the prox-distal RCA  . GERD (gastroesophageal reflux disease)   . HA (headache)   . Hypertension   . 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[place])   . TIA (transient ischemic attack)   . Weakness    Past Surgical History:  Procedure Laterality Date  . CARDIAC CATHETERIZATION  January 2013   Proximal RCA 30-40%. Otherwise normal.  . CARDIAC CATHETERIZATION N/A 10/18/2015   Procedure: Left Heart Cath and Coronary Angiography;  Surgeon: Jettie Booze, MD;  Location: [facility];  Service: Cardiovascular;  Laterality: N/A;  . NM MYOVIEW LTD  March 2015   LOW RISK. No scar or ischemia. Normal EF (55%). No RWMA  . surgical excision of rectal cancer     Social History   Socioeconomic History  . Marital status: Single    Spouse name: Not on file  . Number of children: 11  . Years of education: 24  . Highest education level: Not on file  Occupational  History  . Occupation: Retried    Comment: retired  Scientific laboratory technician  . Financial resource strain: Not on file  . Food insecurity:    Worry: Not on file    Inability: Not on file  . Transportation needs:    Medical: Not on file    Non-medical: Not on file  Tobacco Use  . Smoking status: Current Some Day Smoker    Types: Cigarettes  . Smokeless tobacco: Never Used  Substance and Sexual Activity  . Alcohol use: Yes    Alcohol/week: 1.2 oz    Types: 2 Cans of beer per week  . Drug use: No  . Sexual activity: Not on file  Lifestyle  . Physical activity:    Days per week: Not on file    Minutes per session: Not on file  . Stress: Not on file  Relationships  . Social connections:    Talks on phone: Not on file    Gets together: Not on file    Attends religious service: Not on file    Active member of club or organization: Not on file    Attends meetings of clubs or organizations: Not on file    Relationship status: Not on file  Other Topics Concern  . Not on file  Social History Narrative   Patient is single, retired   Patient is right handed   Education  level is high school   Caffeine consumption is 1 cup daily   No Known Allergies Family History  Problem Relation Age of Onset  . Hypertension Mother   . Cancer Mother   . Liver disease Father      Past medical history, social, surgical and family history all reviewed in electronic medical record.  No pertanent information unless stated regarding to the chief complaint.   Review of Systems:Review of systems updated and as accurate as of 10/04/17  No headache, visual changes, nausea, vomiting, diarrhea, constipation, dizziness, abdominal pain, skin rash, fevers, chills, night sweats, weight loss, swollen lymph nodes, body aches, joint swelling,  chest pain, shortness of breath, mood changes.  Positive muscle aches  Objective  Blood pressure 118/62, pulse 64, height 5\' 11"  (1.803 m), weight 198 lb (89.8 kg), SpO2 98  %. Systems examined below as of 10/04/17   General: No apparent distress alert and oriented x3 mood and affect normal, dressed appropriately.  HEENT: Pupils equal, extraocular movements intact  Respiratory: Patient's speak in full sentences and does not appear short of breath  Cardiovascular: No lower extremity edema, non tender, no erythema  Skin: Warm dry intact with no signs of infection or rash on extremities or on axial skeleton.  Abdomen: Soft nontender  Neuro: Cranial nerves II through XII are intact, neurovascularly intact in all extremities with 2+ DTRs and 2+ pulses.  Lymph: No lymphadenopathy of posterior or anterior cervical chain or axillae bilaterally.  Gait mildly wide-based gait MSK:  Non tender with full range of motion and good stability and symmetric strength and tone of shoulders, elbows, wrist, hip, knee and ankles bilaterally.   Arthritic changes of multiple joints Patient back exam does show some mild degenerative scoliosis.  Loss of at least 10 degrees in all planes.  Significant tightness with Corky Sox on the left side.  Negative straight leg test today.  Strength is 4+ out of 5 but symmetric compared to the contralateral side.  Deep tendon reflexes are intact.    Impression and Recommendations:     This case required medical decision making of moderate complexity.      Note: This dictation was prepared with Dragon dictation along with smaller phrase technology. Any transcriptional errors that result from this process are unintentional.

## 2017-10-05 ENCOUNTER — Encounter: Payer: Self-pay | Admitting: Interventional Cardiology

## 2017-10-09 ENCOUNTER — Ambulatory Visit: Payer: Medicare Other | Admitting: Nurse Practitioner

## 2017-10-09 DIAGNOSIS — Z0289 Encounter for other administrative examinations: Secondary | ICD-10-CM

## 2017-10-11 ENCOUNTER — Encounter: Payer: Self-pay | Admitting: Physical Therapy

## 2017-10-11 ENCOUNTER — Other Ambulatory Visit: Payer: Self-pay

## 2017-10-11 ENCOUNTER — Ambulatory Visit: Payer: Medicare Other | Attending: Family Medicine | Admitting: Physical Therapy

## 2017-10-11 DIAGNOSIS — M62838 Other muscle spasm: Secondary | ICD-10-CM | POA: Insufficient documentation

## 2017-10-11 DIAGNOSIS — R293 Abnormal posture: Secondary | ICD-10-CM | POA: Diagnosis not present

## 2017-10-11 DIAGNOSIS — G8929 Other chronic pain: Secondary | ICD-10-CM

## 2017-10-11 DIAGNOSIS — M6281 Muscle weakness (generalized): Secondary | ICD-10-CM | POA: Insufficient documentation

## 2017-10-11 DIAGNOSIS — M5442 Lumbago with sciatica, left side: Secondary | ICD-10-CM | POA: Diagnosis not present

## 2017-10-11 NOTE — Patient Instructions (Signed)
   HAMSTRING STRETCH WITH TOWEL  While lying down on your back, hook a towel or strap under  your foot and draw up your leg until a stretch is felt along the backside of your leg.   Keep your knee in a straightened position during the stretch.  Hold for at least 30 seconds, then relax. Repeat 2 times each leg, twice a day (once in the morning, once in the evening).     Lumbar Rotations   Lying on your back with your knees bent, slowly drop your legs to one side and hold the stretch. Come back to the middle and switch sides. You should feel the stretch in your back on the opposite side that your legs are leaning.  Hold for a slow count of 10, then bring your legs over to the other side and hold another 10 seconds.  Repeat 5 times each side, twice a day (once in the morning, once in the evening).      SINGLE KNEE TO CHEST STRETCH - SKTC  While lying on your back, use your hands and gently draw up a knee towards your chest.   Keep your other knee straight and lying on the ground.  Hold for 10 seconds, then relax.  Repeat 5 times each leg, twice a day (once in the morning, once in the evening).    LUMBAR ROLL  Use a small lumbar roll in chairs you sit at frequently. Place the roll at the lower curve of your back. This will help you maintain better posture.

## 2017-10-11 NOTE — Therapy (Signed)
[place], [place], [address] Phone: [phone]   Fax:  [phone]  Physical Therapy Evaluation  Patient Details  Name: Andre [name] MRN: [id number] Date of Birth: 1941-05-30 Referring Provider: Hulan Saas    Encounter Date: 10/11/2017  PT End of Session - 10/11/17 0923    Visit Number  1    Number of Visits  13    Date for PT Re-Evaluation  11/08/17    Authorization Type  [employer]HC Medicare/AARP (PN due 10th session)    Authorization Time Period  10/11/17 to 11/22/17    PT Start Time  0833    PT Stop Time  0915    PT Time Calculation (min)  42 min    Activity Tolerance  Patient tolerated treatment well    Behavior During Therapy  [facility] for tasks assessed/performed       Past Medical History:  Diagnosis Date  . Arthritis   . Coronary artery disease, non-occlusive    a. 06/2011: cath for abnormal exercise echo: Only 30-40% proximal RCA. Otherwise normal. b. 10/2015: cath for abnormal NST and atypical CP --> showed mild 30-40% stenosis in the prox-distal RCA  . GERD (gastroesophageal reflux disease)   . HA (headache)   . Hypertension   . 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[place])   . TIA (transient ischemic attack)   . Weakness     Past Surgical History:  Procedure Laterality Date  . CARDIAC CATHETERIZATION  January 2013   Proximal RCA 30-40%. Otherwise normal.  . CARDIAC CATHETERIZATION N/A 10/18/2015   Procedure: Left Heart Cath and Coronary Angiography;  Surgeon: Jettie Booze, MD;  Location: [facility];  Service: Cardiovascular;  Laterality: N/A;  . NM MYOVIEW LTD  March 2015   LOW RISK. No scar or ischemia. Normal EF (55%). No RWMA  . surgical excision of rectal cancer      There were no vitals filed for this visit.   Subjective Assessment - 10/11/17 0834    Subjective  My back has been bothering me about a year, it comes and goes a lot. 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 It started on its own, I  wasn't doing anything when it started. No numbness or tingling, it is more like a creeping, moving sort of pain. Sometimes it goes into my chest but alkaseltzer can help, sometimes it is burning in my chest. Symptoms go all the way down to my left foot down the back of my leg. No incontinence of bowel or bladder.     How long can you sit comfortably?  I sit a lot since I've been retired, sitting is not a big issue     How long can you stand comfortably?  no limits     How long can you walk comfortably?  no limits     Patient Stated Goals  try to see what this is and work it out     Currently in Pain?  Yes    Pain Score  6     Pain Location  Back    Pain Orientation  Left;Lower    Pain Descriptors / Indicators  Shooting;Nagging    Pain Type  Chronic pain    Pain Radiating Towards  down back of L leg to foot     Pain Onset  More than a month ago    Pain Frequency  Constant    Aggravating Factors   unsure, sometimes sitting too long  Pain Relieving Factors  nothing     Effect of Pain on Daily Activities  moderate          [facility] PT Assessment - 10/11/17 0001      Assessment   Medical Diagnosis  back pain     Referring Provider  Hulan Saas     Onset Date/Surgical Date  -- about a year ago     Next MD Visit  Dr. Tamala Julian in June     Prior Therapy  none       Precautions   Precautions  None      Restrictions   Weight Bearing Restrictions  No      Balance Screen   Has the patient fallen in the past 6 months  No    Has the patient had a decrease in activity level because of a fear of falling?   No    Is the patient reluctant to leave their home because of a fear of falling?   No      Prior Function   Level of Independence  Independent;Independent with basic ADLs;Independent with gait;Independent with transfers    Vocation  Retired    Leisure  none       Observation/Other Assessments   Observations  SLR (-) B, scour (-) B, unable to test FABER due to hip ROM limitations B        Sensation   Light Touch  Impaired by gross assessment    Additional Comments  reduced light touch sensation in lower lumbar dermatomes       Posture/Postural Control   Posture/Postural Control  Postural limitations    Postural Limitations  Rounded Shoulders;Forward head      ROM / Strength   AROM / PROM / Strength  AROM;Strength      AROM   AROM Assessment Site  Lumbar    Lumbar Flexion  mild limitation; RFIS increases L LE pain     Lumbar Extension  WFL; REIS no change     Lumbar - Right Side Bend  moderate limitation     Lumbar - Left Side Bend  moderate limitation       Strength   Strength Assessment Site  Hip;Knee    Right/Left Hip  Right;Left    Right Hip Flexion  4/5    Right Hip Extension  3/5    Right Hip ABduction  5/5    Left Hip Flexion  3+/5    Left Hip Extension  3-/5    Left Hip ABduction  4+/5    Right/Left Knee  Right;Left    Right Knee Flexion  4+/5    Right Knee Extension  5/5    Left Knee Flexion  4+/5    Left Knee Extension  5/5      Flexibility   Soft Tissue Assessment /Muscle Length  yes    Hamstrings  moderate tightness B     Piriformis  moderate tightness, R more than L       Palpation   Palpation comment  tenderness, increased muscle spasm at L4-L5 level of paraspinals                 Objective measurements completed on examination: See above findings.      [facility] PT Treatment/Exercise - 10/11/17 0001      Therapeutic Activites    Therapeutic Activities  Other Therapeutic Activities    Other Therapeutic Activities  discussed lumbar support during sitting  Exercises   Exercises  Lumbar      Lumbar Exercises: Stretches   Active Hamstring Stretch  2 reps;30 seconds    Single Knee to Chest Stretch  3 reps;10 seconds    Lower Trunk Rotation  3 reps;10 seconds             PT Education - 10/11/17 0923    Education provided  Yes    Education Details  exam findings, prognosis, POC, HEP. lumbar support in  sitting     Person(s) Educated  Patient    Methods  Explanation;Demonstration;Handout    Comprehension  Verbalized understanding;Need further instruction       PT Short Term Goals - 10/11/17 0925      PT SHORT TERM GOAL #1   Title  Patient to be compliant with appropriate HEP, to be updated PRN     Time  3    Period  Weeks    Status  New    Target Date  11/01/17      PT SHORT TERM GOAL #2   Title  Patient to be able to perform bed mobility and functional lifting with correct mechanics in order to prevent exacerbation of back pain     Time  3    Period  Weeks    Status  New      PT SHORT TERM GOAL #3   Title  Patient to demonstrate lumbar ROM as being full on all planes and at least 50% improvement in bilateral HS and hip flexibility in order to improve mobility and overall mechanics     Time  3    Period  Weeks    Status  New        PT Long Term Goals - 10/11/17 0927      PT LONG TERM GOAL #1   Title  Patient to report that radicular symptoms in L LE extend no furhter than buttocks in order to show improvement of symptoms     Time  6    Period  Weeks    Status  New    Target Date  11/22/17      PT LONG TERM GOAL #2   Title  Paitent to report pain in back and L LE as being no more than 2/10 at worst in order to show improvement of symptoms and improve QOL     Time  6    Period  Weeks    Status  New      PT LONG TERM GOAL #3   Title  Patient to demonstrate all tested muscle groups as being 5/5 in order to show improvement in functional strength     Time  6    Period  Weeks    Status  New      PT LONG TERM GOAL #4   Title  Patient to be able to maintain correct posture at least 70% of the time without external cues in order to improve mechanics and reduce strain on low back     Time  6    Period  Weeks             Plan - 10/11/17 0092    Clinical Impression Statement  Patient arrives with complaints of low back pain for about one year; he reports he did  not perform any activities that started the pain, but admits he has been very sedentary since retiring. He is able to perform all ADLs and necessary activities, but the  pain is always present. Examination reveals significant lumbar ROM and hip stiffness, postural impairments, functional muscle weakness, impaired muscle flexibility, and impaired functional movement patterns and mechanics. He will benefit from skilled PT services to address functional deficits and reduce pain moving forward.     Clinical Presentation  Stable    Clinical Decision Making  Low    Rehab Potential  Good    PT Frequency  2x / week    PT Duration  6 weeks    PT Treatment/Interventions  ADLs/Self Care Home Management;Cryotherapy;Electrical Stimulation;Iontophoresis 4mg /ml Dexamethasone;Moist Heat;Ultrasound;DME Instruction;Gait training;Stair training;Functional mobility training;Therapeutic activities;Therapeutic exercise;Balance training;Neuromuscular re-education;Patient/family education;Manual techniques;Passive range of motion;Dry needling;Taping    PT Next Visit Plan  review HEP and goals; focus on lumbar and hip mobility, flexibility program before progressing into strength. Postural and biomechanics training.     PT Home Exercise Plan  Eval: HS stretch, SKTC, lumbar rotations, lumbar roll for back support     Consulted and Agree with Plan of Care  Patient       Patient will benefit from skilled therapeutic intervention in order to improve the following deficits and impairments:  Abnormal gait, Increased fascial restricitons, Improper body mechanics, Pain, Decreased coordination, Increased muscle spasms, Postural dysfunction, Decreased strength, Hypomobility, Difficulty walking, Impaired flexibility  Visit Diagnosis: Chronic left-sided low back pain with left-sided sciatica - Plan: PT plan of care cert/re-cert  Abnormal posture - Plan: PT plan of care cert/re-cert  Muscle weakness (generalized) - Plan: PT plan of  care cert/re-cert  Other muscle spasm - Plan: PT plan of care cert/re-cert     Problem List Patient Active Problem List   Diagnosis Date Noted  . Left lumbar radiculopathy 08/02/2017  . Atherosclerosis 06/03/2017  . Low-level of literacy 05/12/2017  . Osteoarthritis 04/20/2017  . Diabetes ([place]) 03/12/2017  . Acute non-recurrent frontal sinusitis 01/04/2017  . COPD exacerbation ([place]) 12/14/2016  . Acute upper respiratory infection 06/20/2016  . Back pain 02/29/2016  . Abnormal stress test 10/18/2015  . Erectile dysfunction 02/25/2015  . Cigarette smoker 02/10/2015  . COPD GOLD 0 11/08/2014  . Hemorrhoids 02/11/2014  . 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 Headache 10/23/2011  . 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[place]) 10/23/2011  . GERD (gastroesophageal reflux disease) 10/23/2011  . Nicotine addiction 10/23/2011  . 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 Cervical stenosis of spinal canal 10/23/2011  . Essential hypertension, benign 07/10/2011    Deniece Ree PT, DPT, CBIS  Supplemental Physical Therapist [place]   Pager [facility] [address] [place], [place], [address] Phone: [phone]   Fax:  [phone]  Name: Andre [name] MRN: [id number] Date of Birth: January 24, 1941

## 2017-10-16 ENCOUNTER — Ambulatory Visit: Payer: Medicare Other | Admitting: Physical Therapy

## 2017-10-16 ENCOUNTER — Encounter: Payer: Self-pay | Admitting: Physical Therapy

## 2017-10-16 DIAGNOSIS — M62838 Other muscle spasm: Secondary | ICD-10-CM

## 2017-10-16 DIAGNOSIS — M6281 Muscle weakness (generalized): Secondary | ICD-10-CM | POA: Diagnosis not present

## 2017-10-16 DIAGNOSIS — G8929 Other chronic pain: Secondary | ICD-10-CM | POA: Diagnosis not present

## 2017-10-16 DIAGNOSIS — R293 Abnormal posture: Secondary | ICD-10-CM | POA: Diagnosis not present

## 2017-10-16 DIAGNOSIS — M5442 Lumbago with sciatica, left side: Principal | ICD-10-CM

## 2017-10-16 NOTE — Therapy (Signed)
[place], [place], [address] Phone: [phone]   Fax:  [phone]  Physical Therapy Treatment  Patient Details  Name: Andre [name] MRN: [id number] Date of Birth: [birth year] Referring Provider: Hulan Saas    Encounter Date: 10/16/2017  PT End of Session - 10/16/17 0841    Visit Number  2    Number of Visits  13    Date for PT Re-Evaluation  11/08/17    Authorization Type  UHC Medicare/AARP (PN due 10th session)    Authorization Time Period  10/11/17 to 11/22/17    PT Start Time  0801    PT Stop Time  0840    PT Time Calculation (min)  39 min    Activity Tolerance  Patient tolerated treatment well    Behavior During Therapy  [facility] for tasks assessed/performed       Past Medical History:  Diagnosis Date  . Arthritis   . Coronary artery disease, non-occlusive    a. 06/2011: cath for abnormal exercise echo: Only 30-40% proximal RCA. Otherwise normal. b. 10/2015: cath for abnormal NST and atypical CP --> showed mild 30-40% stenosis in the prox-distal RCA  . GERD (gastroesophageal reflux disease)   . HA (headache)   . Hypertension   . 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[place])   . TIA (transient ischemic attack)   . Weakness     Past Surgical History:  Procedure Laterality Date  . CARDIAC CATHETERIZATION  January 2013   Proximal RCA 30-40%. Otherwise normal.  . CARDIAC CATHETERIZATION N/A 10/18/2015   Procedure: Left Heart Cath and Coronary Angiography;  Surgeon: Jettie Booze, MD;  Location: [facility];  Service: Cardiovascular;  Laterality: N/A;  . NM MYOVIEW LTD  March 2015   LOW RISK. No scar or ischemia. Normal EF (55%). No RWMA  . surgical excision of rectal cancer      There were no vitals filed for this visit.  Subjective Assessment - 10/16/17 0801    Subjective  i am feeling a little better, HEP has been helping but this rainy weather is making my back hurt.     Patient Stated Goals  try to see  what this is and work it out     Currently in Pain?  Yes    Pain Score  7     Pain Location  Back    Pain Orientation  Lower;Left    Pain Descriptors / Indicators  Shooting;Nagging    Pain Type  Chronic pain    Pain Radiating Towards  down back of leg L to foot                        [facility] Adult PT Treatment/Exercise - 10/16/17 0001      Lumbar Exercises: Stretches   Active Hamstring Stretch  2 reps;30 seconds    Single Knee to Chest Stretch  5 reps;10 seconds    Double Knee to Chest Stretch  3 reps;10 seconds    Lower Trunk Rotation  5 reps;10 seconds    Pelvic Tilt  10 reps    Piriformis Stretch  2 reps;30 seconds      Lumbar Exercises: Supine   Ab Set  3 seconds;20 reps    Clam  10 reps;Other (comment) red TB     Bent Knee Raise  10 reps core set, red TB     Bridge  10 reps    Straight Leg Raise  10  reps eccentric lower, core set       Manual Therapy   Manual Therapy  Soft tissue mobilization    Manual therapy comments  seperate from all other skilled services     Soft tissue mobilization  STM ball assisted B lumbar paraspinals              PT Education - 10/16/17 0841    Education provided  Yes    Education Details  goal review, POC, functional bed mobility training     Person(s) Educated  Patient    Methods  Explanation    Comprehension  Verbalized understanding;Need further instruction       PT Short Term Goals - 10/11/17 0925      PT SHORT TERM GOAL #1   Title  Patient to be compliant with appropriate HEP, to be updated PRN     Time  3    Period  Weeks    Status  New    Target Date  11/01/17      PT SHORT TERM GOAL #2   Title  Patient to be able to perform bed mobility and functional lifting with correct mechanics in order to prevent exacerbation of back pain     Time  3    Period  Weeks    Status  New      PT SHORT TERM GOAL #3   Title  Patient to demonstrate lumbar ROM as being full on all planes and at least 50% improvement in  bilateral HS and hip flexibility in order to improve mobility and overall mechanics     Time  3    Period  Weeks    Status  New        PT Long Term Goals - 10/11/17 0927      PT LONG TERM GOAL #1   Title  Patient to report that radicular symptoms in L LE extend no furhter than buttocks in order to show improvement of symptoms     Time  6    Period  Weeks    Status  New    Target Date  11/22/17      PT LONG TERM GOAL #2   Title  Paitent to report pain in back and L LE as being no more than 2/10 at worst in order to show improvement of symptoms and improve QOL     Time  6    Period  Weeks    Status  New      PT LONG TERM GOAL #3   Title  Patient to demonstrate all tested muscle groups as being 5/5 in order to show improvement in functional strength     Time  6    Period  Weeks    Status  New      PT LONG TERM GOAL #4   Title  Patient to be able to maintain correct posture at least 70% of the time without external cues in order to improve mechanics and reduce strain on low back     Time  6    Period  Weeks            Plan - 10/16/17 3810    Clinical Impression Statement  Reviewed goals and proceeded with functional stretching and mobility program this session, progressing to functional strength and core activation as tolerated and time allowed today. He continues to demonstrate significant functional tightness in lumbar spine and hips likely contributing to pain at this time, and  will continue to benefit from skilled PT services moving forward.     Rehab Potential  Good    PT Frequency  2x / week    PT Duration  6 weeks    PT Next Visit Plan  continue focus on mobility and flexibility, strength, review bed mobility     PT Home Exercise Plan  Eval: HS stretch, SKTC, lumbar rotations, lumbar roll for back support     Consulted and Agree with Plan of Care  Patient       Patient will benefit from skilled therapeutic intervention in order to improve the following deficits  and impairments:  Abnormal gait, Increased fascial restricitons, Improper body mechanics, Pain, Decreased coordination, Increased muscle spasms, Postural dysfunction, Decreased strength, Hypomobility, Difficulty walking, Impaired flexibility  Visit Diagnosis: Chronic left-sided low back pain with left-sided sciatica  Abnormal posture  Muscle weakness (generalized)  Other muscle spasm     Problem List Patient Active Problem List   Diagnosis Date Noted  . Left lumbar radiculopathy 08/02/2017  . Atherosclerosis 06/03/2017  . Low-level of literacy 05/12/2017  . Osteoarthritis 04/20/2017  . Diabetes ([place]) 03/12/2017  . Acute non-recurrent frontal sinusitis 01/04/2017  . COPD exacerbation ([place]) 12/14/2016  . Acute upper respiratory infection 06/20/2016  . Back pain 02/29/2016  . Abnormal stress test 10/18/2015  . Erectile dysfunction 02/25/2015  . Cigarette smoker 02/10/2015  . COPD GOLD 0 11/08/2014  . Hemorrhoids 02/11/2014  . 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 Headache 10/23/2011  . 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[place]) 10/23/2011  . GERD (gastroesophageal reflux disease) 10/23/2011  . Nicotine addiction 10/23/2011  . 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 Cervical stenosis of spinal canal 10/23/2011  . Essential hypertension, benign 07/10/2011    Deniece Ree PT, DPT, CBIS  Supplemental Physical Therapist [place]   Pager [facility] [address] [place], [place], [address] Phone: [phone]   Fax:  [phone]  Name: Andre [name] MRN: [id number] Date of Birth: 07/14/1940

## 2017-10-23 ENCOUNTER — Ambulatory Visit: Payer: Medicare Other | Admitting: Interventional Cardiology

## 2017-10-24 ENCOUNTER — Encounter: Payer: Self-pay | Admitting: Physical Therapy

## 2017-10-24 ENCOUNTER — Ambulatory Visit: Payer: Medicare Other | Admitting: Physical Therapy

## 2017-10-24 DIAGNOSIS — R293 Abnormal posture: Secondary | ICD-10-CM | POA: Diagnosis not present

## 2017-10-24 DIAGNOSIS — M62838 Other muscle spasm: Secondary | ICD-10-CM

## 2017-10-24 DIAGNOSIS — M6281 Muscle weakness (generalized): Secondary | ICD-10-CM

## 2017-10-24 DIAGNOSIS — G8929 Other chronic pain: Secondary | ICD-10-CM | POA: Diagnosis not present

## 2017-10-24 DIAGNOSIS — M5442 Lumbago with sciatica, left side: Principal | ICD-10-CM

## 2017-10-24 NOTE — Patient Instructions (Signed)
   BRIDGING  While lying on your back with knees bent, tighten your lower abdominals, squeeze your buttocks and then raise your buttocks off the floor/bed as creating a "Bridge" with your body.   Repeat 10-15 times, twice a day (morning and evening)     SUPINE HIP ABDUCTION - ELASTIC BAND CLAMS - CLAMSHELL  Lie down on your back with your knees bent. Place an elastic band around your knees and then draw your knees apart.  Repeat 15 times, twice a day (morning and evening)    PIRIFORMIS STRETCH  While lying on your back with both knee bent, cross your affected leg on the other knee.   Next, hold your unaffected thigh and pull it up towards your chest until a stretch is felt in the buttock.  Hold for 30 seconds, then relax.  Repeat 2-3 times each leg, twice a day (morning and evening)

## 2017-10-24 NOTE — Therapy (Signed)
[place], [place], [address] Phone: [phone]   Fax:  [phone]  Physical Therapy Treatment  Patient Details  Name: Andre [name] MRN: [id number] Date of Birth: [birth year] Referring Provider: Hulan Saas    Encounter Date: 10/24/2017  PT End of Session - 10/24/17 0929    Visit Number  3    Number of Visits  13    Date for PT Re-Evaluation  11/08/17    Authorization Type  UHC Medicare/AARP (PN due 10th session)    Authorization Time Period  10/11/17 to 11/22/17    PT Start Time  0849    PT Stop Time  0929    PT Time Calculation (min)  40 min    Activity Tolerance  Patient tolerated treatment well    Behavior During Therapy  [facility] for tasks assessed/performed       Past Medical History:  Diagnosis Date  . Arthritis   . Coronary artery disease, non-occlusive    a. 06/2011: cath for abnormal exercise echo: Only 30-40% proximal RCA. Otherwise normal. b. 10/2015: cath for abnormal NST and atypical CP --> showed mild 30-40% stenosis in the prox-distal RCA  . GERD (gastroesophageal reflux disease)   . HA (headache)   . Hypertension   . 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[place])   . TIA (transient ischemic attack)   . Weakness     Past Surgical History:  Procedure Laterality Date  . CARDIAC CATHETERIZATION  January 2013   Proximal RCA 30-40%. Otherwise normal.  . CARDIAC CATHETERIZATION N/A 10/18/2015   Procedure: Left Heart Cath and Coronary Angiography;  Surgeon: Jettie Booze, MD;  Location: [facility];  Service: Cardiovascular;  Laterality: N/A;  . NM MYOVIEW LTD  March 2015   LOW RISK. No scar or ischemia. Normal EF (55%). No RWMA  . surgical excision of rectal cancer      There were no vitals filed for this visit.  Subjective Assessment - 10/24/17 0849    Subjective  I am feeling good, I felt good after last session. Its hard to say what's been helping me the most but I feel that hte exercise is  helping. At times it still runs down into my leg but not as often as it was.     Patient Stated Goals  try to see what this is and work it out     Currently in Pain?  Yes    Pain Score  6     Pain Location  Back    Pain Orientation  Lower;Left    Pain Descriptors / Indicators  Shooting    Pain Type  Chronic pain    Pain Radiating Towards  down to mid calf L     Pain Onset  More than a month ago    Pain Frequency  Intermittent    Aggravating Factors   unsure     Pain Relieving Factors  exercise     Effect of Pain on Daily Activities  moderate                        [facility] Adult PT Treatment/Exercise - 10/24/17 0001      Lumbar Exercises: Stretches   Active Hamstring Stretch  2 reps;30 seconds    Single Knee to Chest Stretch  5 reps;10 seconds    Double Knee to Chest Stretch  5 reps;10 seconds    Lower Trunk Rotation  5 reps;10 seconds  Piriformis Stretch  2 reps;30 seconds      Lumbar Exercises: Supine   Ab Set  3 seconds;20 reps    Bridge  15 reps      Lumbar Exercises: Prone   Straight Leg Raise  10 reps;Other (comment) knee bent       Manual Therapy   Manual Therapy  Soft tissue mobilization    Manual therapy comments  seperate from all other skilled services     Soft tissue mobilization  STM ball assisted B lumbar paraspinals       Sidelying hip ABD Mod cues 1x5 B        PT Education - 10/24/17 0929    Education provided  Yes    Education Details  HEP updates     Person(s) Educated  Patient    Methods  Explanation;Handout;Demonstration    Comprehension  Verbalized understanding;Returned demonstration;Need further instruction       PT Short Term Goals - 10/11/17 0925      PT SHORT TERM GOAL #1   Title  Patient to be compliant with appropriate HEP, to be updated PRN     Time  3    Period  Weeks    Status  New    Target Date  11/01/17      PT SHORT TERM GOAL #2   Title  Patient to be able to perform bed mobility and functional lifting  with correct mechanics in order to prevent exacerbation of back pain     Time  3    Period  Weeks    Status  New      PT SHORT TERM GOAL #3   Title  Patient to demonstrate lumbar ROM as being full on all planes and at least 50% improvement in bilateral HS and hip flexibility in order to improve mobility and overall mechanics     Time  3    Period  Weeks    Status  New        PT Long Term Goals - 10/11/17 0927      PT LONG TERM GOAL #1   Title  Patient to report that radicular symptoms in L LE extend no furhter than buttocks in order to show improvement of symptoms     Time  6    Period  Weeks    Status  New    Target Date  11/22/17      PT LONG TERM GOAL #2   Title  Paitent to report pain in back and L LE as being no more than 2/10 at worst in order to show improvement of symptoms and improve QOL     Time  6    Period  Weeks    Status  New      PT LONG TERM GOAL #3   Title  Patient to demonstrate all tested muscle groups as being 5/5 in order to show improvement in functional strength     Time  6    Period  Weeks    Status  New      PT LONG TERM GOAL #4   Title  Patient to be able to maintain correct posture at least 70% of the time without external cues in order to improve mechanics and reduce strain on low back     Time  6    Period  Weeks            Plan - 10/24/17 0929    Clinical Impression Statement  Continued with general lumbar stretching and mobility program, also with progression of functional strengthening today with HEP updates provided this session. Patient reports some improvement in condition moving forward, however continues to demonstrate considerable functional deficits for which he will continue to benefit from skilled PT services.     Rehab Potential  Good    PT Frequency  2x / week    PT Duration  6 weeks    PT Treatment/Interventions  ADLs/Self Care Home Management;Cryotherapy;Electrical Stimulation;Iontophoresis 4mg /ml Dexamethasone;Moist  Heat;Ultrasound;DME Instruction;Gait training;Stair training;Functional mobility training;Therapeutic activities;Therapeutic exercise;Balance training;Neuromuscular re-education;Patient/family education;Manual techniques;Passive range of motion;Dry needling;Taping    PT Next Visit Plan  continue focus on mobility and flexibility, strength, review bed mobility     PT Home Exercise Plan  Eval: HS stretch, SKTC, lumbar rotations, lumbar roll for back support, figure 4 stretch, bridges, supine clams with red TB     Consulted and Agree with Plan of Care  Patient       Patient will benefit from skilled therapeutic intervention in order to improve the following deficits and impairments:  Abnormal gait, Increased fascial restricitons, Improper body mechanics, Pain, Decreased coordination, Increased muscle spasms, Postural dysfunction, Decreased strength, Hypomobility, Difficulty walking, Impaired flexibility  Visit Diagnosis: Chronic left-sided low back pain with left-sided sciatica  Abnormal posture  Muscle weakness (generalized)  Other muscle spasm     Problem List Patient Active Problem List   Diagnosis Date Noted  . Left lumbar radiculopathy 08/02/2017  . Atherosclerosis 06/03/2017  . Low-level of literacy 05/12/2017  . Osteoarthritis 04/20/2017  . Diabetes ([place]) 03/12/2017  . Acute non-recurrent frontal sinusitis 01/04/2017  . COPD exacerbation ([place]) 12/14/2016  . Acute upper respiratory infection 06/20/2016  . Back pain 02/29/2016  . Abnormal stress test 10/18/2015  . Erectile dysfunction 02/25/2015  . Cigarette smoker 02/10/2015  . COPD GOLD 0 11/08/2014  . Hemorrhoids 02/11/2014  . 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 Headache 10/23/2011  . 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[place]) 10/23/2011  . GERD (gastroesophageal reflux disease) 10/23/2011  . Nicotine addiction 10/23/2011  . 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 Cervical stenosis of spinal canal 10/23/2011  . Essential hypertension,  benign 07/10/2011    Deniece Ree PT, DPT, CBIS  Supplemental Physical Therapist [place]   Pager [facility] [address] [place], [place], [address] Phone: [phone]   Fax:  [phone]  Name: Andre [name] MRN: [id number] Date of Birth: 15-Jun-1940

## 2017-10-25 ENCOUNTER — Other Ambulatory Visit: Payer: Self-pay

## 2017-10-25 DIAGNOSIS — E785 Hyperlipidemia, unspecified: Secondary | ICD-10-CM

## 2017-10-25 MED ORDER — ATORVASTATIN CALCIUM 10 MG PO TABS: 10.0000 mg | ORAL_TABLET | Freq: Every day | ORAL | 1 refills | Status: DC

## 2017-10-26 ENCOUNTER — Encounter: Payer: Self-pay | Admitting: Physical Therapy

## 2017-10-26 ENCOUNTER — Ambulatory Visit: Payer: Medicare Other | Admitting: Physical Therapy

## 2017-10-26 DIAGNOSIS — M5442 Lumbago with sciatica, left side: Secondary | ICD-10-CM | POA: Diagnosis not present

## 2017-10-26 DIAGNOSIS — R293 Abnormal posture: Secondary | ICD-10-CM

## 2017-10-26 DIAGNOSIS — G8929 Other chronic pain: Secondary | ICD-10-CM | POA: Diagnosis not present

## 2017-10-26 DIAGNOSIS — M62838 Other muscle spasm: Secondary | ICD-10-CM | POA: Diagnosis not present

## 2017-10-26 DIAGNOSIS — M6281 Muscle weakness (generalized): Secondary | ICD-10-CM

## 2017-10-26 NOTE — Patient Instructions (Signed)
   PRONE ON ELBOWS - POE  Lying face down, slowly press up and prop yourself up on your elbows.  Hold this position for 10-20 seconds, then relax.  Repeat 5-10 times, twice a day or as needed for pain

## 2017-10-26 NOTE — Therapy (Signed)
[place], [place], [address] Phone: [phone]   Fax:  [phone]  Physical Therapy Treatment  Patient Details  Name: Andre [name] MRN: [id number] Date of Birth: Aug 07, 1940 Referring Provider: Hulan Saas    Encounter Date: 10/26/2017  PT End of Session - 10/26/17 0839    Visit Number  4    Number of Visits  13    Date for PT Re-Evaluation  11/08/17    Authorization Type  UHC Medicare/AARP (PN due 10th session)    Authorization Time Period  10/11/17 to 11/22/17    PT Start Time  0759    PT Stop Time  0838    PT Time Calculation (min)  39 min    Activity Tolerance  Patient tolerated treatment well    Behavior During Therapy  [facility] for tasks assessed/performed       Past Medical History:  Diagnosis Date  . Arthritis   . Coronary artery disease, non-occlusive    a. 06/2011: cath for abnormal exercise echo: Only 30-40% proximal RCA. Otherwise normal. b. 10/2015: cath for abnormal NST and atypical CP --> showed mild 30-40% stenosis in the prox-distal RCA  . GERD (gastroesophageal reflux disease)   . HA (headache)   . Hypertension   . 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[place])   . TIA (transient ischemic attack)   . Weakness     Past Surgical History:  Procedure Laterality Date  . CARDIAC CATHETERIZATION  January 2013   Proximal RCA 30-40%. Otherwise normal.  . CARDIAC CATHETERIZATION N/A 10/18/2015   Procedure: Left Heart Cath and Coronary Angiography;  Surgeon: Jettie Booze, MD;  Location: [facility];  Service: Cardiovascular;  Laterality: N/A;  . NM MYOVIEW LTD  March 2015   LOW RISK. No scar or ischemia. Normal EF (55%). No RWMA  . surgical excision of rectal cancer      There were no vitals filed for this visit.  Subjective Assessment - 10/26/17 0801    Subjective  I am feeling a bit sore this morning, like I got a good workout. Nothing else major going on. The new exercises are going well.     Patient Stated Goals  try to see what this is and work it out     Currently in Pain?  Yes    Pain Score  4     Pain Location  Back    Pain Orientation  Lower;Left    Pain Descriptors / Indicators  Shooting    Pain Type  Chronic pain    Pain Radiating Towards  down to L ankle                        [facility] Adult PT Treatment/Exercise - 10/26/17 0001      Lumbar Exercises: Stretches   Single Knee to Chest Stretch  5 reps;10 seconds    Hip Flexor Stretch  Right;Left;2 reps;30 seconds    Pelvic Tilt  10 reps    Prone on Elbows Stretch  5 reps;10 seconds    Press Ups  5 reps;10 seconds    Quad Stretch  2 reps;30 seconds    Piriformis Stretch  2 reps;30 seconds      Lumbar Exercises: Standing   Heel Raises  15 reps;Other (comment) heel and toe     Forward Lunge  10 reps;Other (comment) 4 inch box     Other Standing Lumbar Exercises  hip hikes 1x10 B  Other Standing Lumbar Exercises  standing marches core set 1x10 B      Lumbar Exercises: Supine   Bridge  15 reps    Straight Leg Raise  10 reps;Other (comment) eccentric lower       Lumbar Exercises: Prone   Straight Leg Raise  10 reps;Other (comment) knee bent              PT Education - 10/26/17 0838    Education provided  Yes    Education Details  HEP updates, benefits of POE exercise and relation to reducing pain, general progression today and possible soreness/DOMS following this session and management strategies     Person(s) Educated  Patient    Methods  Explanation;Handout;Demonstration    Comprehension  Verbalized understanding;Need further instruction;Returned demonstration       PT Short Term Goals - 10/11/17 0925      PT SHORT TERM GOAL #1   Title  Patient to be compliant with appropriate HEP, to be updated PRN     Time  3    Period  Weeks    Status  New    Target Date  11/01/17      PT SHORT TERM GOAL #2   Title  Patient to be able to perform bed mobility and functional lifting with  correct mechanics in order to prevent exacerbation of back pain     Time  3    Period  Weeks    Status  New      PT SHORT TERM GOAL #3   Title  Patient to demonstrate lumbar ROM as being full on all planes and at least 50% improvement in bilateral HS and hip flexibility in order to improve mobility and overall mechanics     Time  3    Period  Weeks    Status  New        PT Long Term Goals - 10/11/17 0927      PT LONG TERM GOAL #1   Title  Patient to report that radicular symptoms in L LE extend no furhter than buttocks in order to show improvement of symptoms     Time  6    Period  Weeks    Status  New    Target Date  11/22/17      PT LONG TERM GOAL #2   Title  Paitent to report pain in back and L LE as being no more than 2/10 at worst in order to show improvement of symptoms and improve QOL     Time  6    Period  Weeks    Status  New      PT LONG TERM GOAL #3   Title  Patient to demonstrate all tested muscle groups as being 5/5 in order to show improvement in functional strength     Time  6    Period  Weeks    Status  New      PT LONG TERM GOAL #4   Title  Patient to be able to maintain correct posture at least 70% of the time without external cues in order to improve mechanics and reduce strain on low back     Time  6    Period  Weeks            Plan - 10/26/17 7341    Clinical Impression Statement  Patient arrives today stating "I feel like I got a good workout" and some increased soreness from last session.  Continued with functional stretching and lumbar mobility, introduced POE and prone press up exercises this session as well as hip flexor and quad stretching today. Otherwise continued with progression of functional strength. Continue to note radicular symptoms running down to L ankle with minimal relief generally, but able to improve them to only running down to the knee at EOS today.     Rehab Potential  Good    PT Frequency  2x / week    PT Duration  6  weeks    PT Treatment/Interventions  ADLs/Self Care Home Management;Cryotherapy;Electrical Stimulation;Iontophoresis 4mg /ml Dexamethasone;Moist Heat;Ultrasound;DME Instruction;Gait training;Stair training;Functional mobility training;Therapeutic activities;Therapeutic exercise;Balance training;Neuromuscular re-education;Patient/family education;Manual techniques;Passive range of motion;Dry needling;Taping    PT Next Visit Plan  continue extension based focus, continue POE and prone pressups. Continue to work lumbar moblity and strength, mechanics     PT Home Exercise Plan  Eval: HS stretch, SKTC, lumbar rotations, lumbar roll for back support, figure 4 stretch, bridges, supine clams with red TB, POE     Consulted and Agree with Plan of Care  Patient       Patient will benefit from skilled therapeutic intervention in order to improve the following deficits and impairments:  Abnormal gait, Increased fascial restricitons, Improper body mechanics, Pain, Decreased coordination, Increased muscle spasms, Postural dysfunction, Decreased strength, Hypomobility, Difficulty walking, Impaired flexibility  Visit Diagnosis: Chronic left-sided low back pain with left-sided sciatica  Abnormal posture  Muscle weakness (generalized)  Other muscle spasm     Problem List Patient Active Problem List   Diagnosis Date Noted  . Left lumbar radiculopathy 08/02/2017  . Atherosclerosis 06/03/2017  . Low-level of literacy 05/12/2017  . Osteoarthritis 04/20/2017  . Diabetes ([place]) 03/12/2017  . Acute non-recurrent frontal sinusitis 01/04/2017  . COPD exacerbation ([place]) 12/14/2016  . Acute upper respiratory infection 06/20/2016  . Back pain 02/29/2016  . Abnormal stress test 10/18/2015  . Erectile dysfunction 02/25/2015  . Cigarette smoker 02/10/2015  . COPD GOLD 0 11/08/2014  . Hemorrhoids 02/11/2014  . 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 Headache 10/23/2011  . 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[place]) 10/23/2011  . GERD  (gastroesophageal reflux disease) 10/23/2011  . Nicotine addiction 10/23/2011  . 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 Cervical stenosis of spinal canal 10/23/2011  . Essential hypertension, benign 07/10/2011    Deniece Ree PT, DPT, CBIS  Supplemental Physical Therapist [place]   Pager [facility] [address] [place], [place], [address] Phone: [phone]   Fax:  [phone]  Name: Andre [name] MRN: [id number] Date of Birth: [birth year]

## 2017-10-30 ENCOUNTER — Ambulatory Visit: Payer: Medicare Other | Admitting: Physical Therapy

## 2017-10-30 ENCOUNTER — Encounter: Payer: Self-pay | Admitting: Physical Therapy

## 2017-10-30 DIAGNOSIS — M6281 Muscle weakness (generalized): Secondary | ICD-10-CM | POA: Diagnosis not present

## 2017-10-30 DIAGNOSIS — R293 Abnormal posture: Secondary | ICD-10-CM

## 2017-10-30 DIAGNOSIS — M62838 Other muscle spasm: Secondary | ICD-10-CM

## 2017-10-30 DIAGNOSIS — G8929 Other chronic pain: Secondary | ICD-10-CM

## 2017-10-30 DIAGNOSIS — M5442 Lumbago with sciatica, left side: Principal | ICD-10-CM

## 2017-10-30 NOTE — Therapy (Signed)
[place], [place], [address] Phone: [phone]   Fax:  [phone]  Physical Therapy Treatment  Patient Details  Name: Andre [name] MRN: [id number] Date of Birth: 09-09-1940 Referring Provider: Hulan Saas    Encounter Date: 10/30/2017  PT End of Session - 10/30/17 0853    Visit Number  5    Number of Visits  13    Date for PT Re-Evaluation  11/08/17    Authorization Type  UHC Medicare/AARP (PN due 10th session)    Authorization Time Period  10/11/17 to 11/22/17    PT Start Time  0800    PT Stop Time  0853    PT Time Calculation (min)  53 min       Past Medical History:  Diagnosis Date  . Arthritis   . Coronary artery disease, non-occlusive    a. 06/2011: cath for abnormal exercise echo: Only 30-40% proximal RCA. Otherwise normal. b. 10/2015: cath for abnormal NST and atypical CP --> showed mild 30-40% stenosis in the prox-distal RCA  . GERD (gastroesophageal reflux disease)   . HA (headache)   . Hypertension   . 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[place])   . TIA (transient ischemic attack)   . Weakness     Past Surgical History:  Procedure Laterality Date  . CARDIAC CATHETERIZATION  January 2013   Proximal RCA 30-40%. Otherwise normal.  . CARDIAC CATHETERIZATION N/A 10/18/2015   Procedure: Left Heart Cath and Coronary Angiography;  Surgeon: Jettie Booze, MD;  Location: [facility];  Service: Cardiovascular;  Laterality: N/A;  . NM MYOVIEW LTD  March 2015   LOW RISK. No scar or ischemia. Normal EF (55%). No RWMA  . surgical excision of rectal cancer      There were no vitals filed for this visit.  Subjective Assessment - 10/30/17 0802    Subjective  Still a little sore this morning. Shooting pain in left leg to calf. Got stiff last night.     Currently in Pain?  Yes    Pain Score  7     Pain Location  Back    Pain Orientation  Left    Pain Descriptors / Indicators  Shooting    Aggravating Factors    getting into bed and stretching out     Pain Relieving Factors  exercies                        [facility] Adult PT Treatment/Exercise - 10/30/17 0001      Lumbar Exercises: Stretches   Active Hamstring Stretch  60 seconds;Right;Left    Prone on Elbows Stretch  60 seconds    Press Ups  5 reps;10 seconds    Figure 4 Stretch  2 reps;20 seconds      Lumbar Exercises: Supine   Clam  10 reps green    Bridge  10 reps    Bridge with clamshell  10 reps green    Single Leg Bridge  10 reps    Straight Leg Raise  10 reps;Other (comment) eccentric lower       Modalities   Modalities  Moist Heat      Moist Heat Therapy   Number Minutes Moist Heat  10 Minutes    Moist Heat Location  -- lateral      Manual Therapy   Soft tissue mobilization  STM ball assisted left gluteal  PT Short Term Goals - 10/11/17 0925      PT SHORT TERM GOAL #1   Title  Patient to be compliant with appropriate HEP, to be updated PRN     Time  3    Period  Weeks    Status  New    Target Date  11/01/17      PT SHORT TERM GOAL #2   Title  Patient to be able to perform bed mobility and functional lifting with correct mechanics in order to prevent exacerbation of back pain     Time  3    Period  Weeks    Status  New      PT SHORT TERM GOAL #3   Title  Patient to demonstrate lumbar ROM as being full on all planes and at least 50% improvement in bilateral HS and hip flexibility in order to improve mobility and overall mechanics     Time  3    Period  Weeks    Status  New        PT Long Term Goals - 10/11/17 0927      PT LONG TERM GOAL #1   Title  Patient to report that radicular symptoms in L LE extend no furhter than buttocks in order to show improvement of symptoms     Time  6    Period  Weeks    Status  New    Target Date  11/22/17      PT LONG TERM GOAL #2   Title  Paitent to report pain in back and L LE as being no more than 2/10 at worst in order to show  improvement of symptoms and improve QOL     Time  6    Period  Weeks    Status  New      PT LONG TERM GOAL #3   Title  Patient to demonstrate all tested muscle groups as being 5/5 in order to show improvement in functional strength     Time  6    Period  Weeks    Status  New      PT LONG TERM GOAL #4   Title  Patient to be able to maintain correct posture at least 70% of the time without external cues in order to improve mechanics and reduce strain on low back     Time  6    Period  Weeks            Plan - 10/30/17 0175    Clinical Impression Statement  Pt arrives reporting increased pain in left lower back, gluteals and into calf. He reports some relief with prone stretch yesterday. We reviewed prone HEP and focused manual to left piriformis, gluteals, as well as strething and gentle strengthening to hip extensors. HMP applied to left lumbar/gluteals at end of session. After ward, pt reported pain only in left lumbar and upper gluteals, described as soreness.     PT Next Visit Plan  continue extension based focus, continue POE and prone pressups. Continue to work lumbar moblity and strength, mechanics     PT Home Exercise Plan  Eval: HS stretch, SKTC, lumbar rotations, lumbar roll for back support, figure 4 stretch, bridges, supine clams with red TB, POE     Consulted and Agree with Plan of Care  Patient       Patient will benefit from skilled therapeutic intervention in order to improve the following deficits and impairments:  Abnormal gait,  Increased fascial restricitons, Improper body mechanics, Pain, Decreased coordination, Increased muscle spasms, Postural dysfunction, Decreased strength, Hypomobility, Difficulty walking, Impaired flexibility  Visit Diagnosis: Chronic left-sided low back pain with left-sided sciatica  Abnormal posture  Muscle weakness (generalized)  Other muscle spasm     Problem List Patient Active Problem List   Diagnosis Date Noted  . Left  lumbar radiculopathy 08/02/2017  . Atherosclerosis 06/03/2017  . Low-level of literacy 05/12/2017  . Osteoarthritis 04/20/2017  . Diabetes ([place]) 03/12/2017  . Acute non-recurrent frontal sinusitis 01/04/2017  . COPD exacerbation ([place]) 12/14/2016  . Acute upper respiratory infection 06/20/2016  . Back pain 02/29/2016  . Abnormal stress test 10/18/2015  . Erectile dysfunction 02/25/2015  . Cigarette smoker 02/10/2015  . COPD GOLD 0 11/08/2014  . Hemorrhoids 02/11/2014  . 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 Headache 10/23/2011  . 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[place]) 10/23/2011  . GERD (gastroesophageal reflux disease) 10/23/2011  . Nicotine addiction 10/23/2011  . 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 Cervical stenosis of spinal canal 10/23/2011  . Essential hypertension, benign 07/10/2011    Dorene Ar, PTA 10/30/2017, 8:54 AM  [facility] [address] [place], [place], [address] Phone: [phone]   Fax:  [phone]  Name: Andre [name] MRN: [id number] Date of Birth: Apr 06, 1941

## 2017-11-01 ENCOUNTER — Telehealth: Payer: Self-pay | Admitting: Physical Therapy

## 2017-11-01 ENCOUNTER — Ambulatory Visit: Payer: Medicare Other | Admitting: Physical Therapy

## 2017-11-01 NOTE — Telephone Encounter (Signed)
No-show. Called and spoke to Mr. Grine son, who reports that Mr. Eckenrode is not home and had left to go to an appointment awhile ago. Reviewed time/date of next appointment and asked his son to pass on this information.  Deniece Ree PT, DPT, CBIS  Supplemental Physical Therapist [facility]   Pager [phone]

## 2017-11-06 ENCOUNTER — Ambulatory Visit: Payer: Medicare Other | Admitting: Physical Therapy

## 2017-11-06 ENCOUNTER — Encounter: Payer: Self-pay | Admitting: Physical Therapy

## 2017-11-06 DIAGNOSIS — R293 Abnormal posture: Secondary | ICD-10-CM | POA: Diagnosis not present

## 2017-11-06 DIAGNOSIS — M5442 Lumbago with sciatica, left side: Principal | ICD-10-CM

## 2017-11-06 DIAGNOSIS — G8929 Other chronic pain: Secondary | ICD-10-CM | POA: Diagnosis not present

## 2017-11-06 DIAGNOSIS — M6281 Muscle weakness (generalized): Secondary | ICD-10-CM

## 2017-11-06 DIAGNOSIS — M62838 Other muscle spasm: Secondary | ICD-10-CM | POA: Diagnosis not present

## 2017-11-06 NOTE — Therapy (Signed)
[place], [place], [address] Phone: [phone]   Fax:  [phone]  Physical Therapy Treatment (Re-assess/Recert/PN)  Patient Details  Name: Andre [name] MRN: [id number] Date of Birth: [birth year] Referring Provider: Hulan Saas    Encounter Date: 11/06/2017   Progress Note Reporting Period 10/11/17 to 11/06/17  See note below for Objective Data and Assessment of Progress/Goals.       PT End of Session - 11/06/17 0842    Visit Number  6    Number of Visits  13    Date for PT Re-Evaluation  11/27/17    Authorization Type  UHC Medicare/AARP (PN due 16th session)    Authorization Time Period  08/11/47 to 12/11/61; recert done 7/85    PT Start Time  0802    PT Stop Time  0841    PT Time Calculation (min)  39 min    Activity Tolerance  Patient tolerated treatment well    Behavior During Therapy  [facility] for tasks assessed/performed       Past Medical History:  Diagnosis Date  . Arthritis   . Coronary artery disease, non-occlusive    a. 06/2011: cath for abnormal exercise echo: Only 30-40% proximal RCA. Otherwise normal. b. 10/2015: cath for abnormal NST and atypical CP --> showed mild 30-40% stenosis in the prox-distal RCA  . GERD (gastroesophageal reflux disease)   . HA (headache)   . Hypertension   . 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[place])   . TIA (transient ischemic attack)   . Weakness     Past Surgical History:  Procedure Laterality Date  . CARDIAC CATHETERIZATION  January 2013   Proximal RCA 30-40%. Otherwise normal.  . CARDIAC CATHETERIZATION N/A 10/18/2015   Procedure: Left Heart Cath and Coronary Angiography;  Surgeon: Jettie Booze, MD;  Location: [facility];  Service: Cardiovascular;  Laterality: N/A;  . NM MYOVIEW LTD  March 2015   LOW RISK. No scar or ischemia. Normal EF (55%). No RWMA  . surgical excision of rectal cancer      There were no vitals filed for this visit.  Subjective  Assessment - 11/06/17 0805    Subjective  I am still sore as ever. I missed last session due to car problems. Its easier for me to get up from a chair now. I can do most of what I need to do, but I just have pain.     How long can you sit comfortably?  5/28- unlimited     How long can you stand comfortably?  5/28- no limits     How long can you walk comfortably?  5/28- no limits     Patient Stated Goals  try to see what this is and work it out     Currently in Pain?  Yes    Pain Score  7     Pain Location  Other (Comment) chest, shoulders, low back     Pain Orientation  Right;Left    Pain Descriptors / Indicators  Discomfort    Pain Type  Chronic pain    Pain Radiating Towards  down to L ankle but not as bad     Pain Onset  More than a month ago    Pain Frequency  Intermittent    Aggravating Factors   hard to say     Pain Relieving Factors  alkaseltzer makes my chest pain better, salve and heat helps my back     Effect of Pain  on Daily Activities  moderate          [facility] PT Assessment - 11/06/17 0001      Assessment   Medical Diagnosis  back pain     Referring Provider  Hulan Saas     Onset Date/Surgical Date  -- about a year ago     Next MD Visit  Dr. Tamala Julian June 6th     Prior Therapy  none       Precautions   Precautions  None      Restrictions   Weight Bearing Restrictions  No      Balance Screen   Has the patient fallen in the past 6 months  No    Has the patient had a decrease in activity level because of a fear of falling?   No    Is the patient reluctant to leave their home because of a fear of falling?   No      Prior Function   Level of Independence  Independent;Independent with basic ADLs;Independent with gait;Independent with transfers    Vocation  Retired    Leisure  none       AROM   Lumbar Flexion  mild limitation     Lumbar Extension  WFL     Lumbar - Right Side Bend  moderate limitation     Lumbar - Left Side Bend  moderate limitation        Strength   Right Hip Flexion  3+/5    Right Hip Extension  3+/5    Right Hip ABduction  4+/5    Left Hip Flexion  3+/5    Left Hip Extension  3/5    Left Hip ABduction  4+/5    Right Knee Flexion  5/5    Right Knee Extension  5/5    Left Knee Flexion  5/5    Left Knee Extension  5/5      Flexibility   Hamstrings  mild tightness B     Piriformis  severe tightness B                    [facility] Adult PT Treatment/Exercise - 11/06/17 0001      Lumbar Exercises: Stretches   Single Knee to Chest Stretch  5 reps;10 seconds    Lower Trunk Rotation  5 reps;10 seconds    Other Lumbar Stretch Exercise  lumbar rotation stretch 3x10 seconds B       Lumbar Exercises: Seated   Other Seated Lumbar Exercises  seated marches with core set 1x10 B       Lumbar Exercises: Supine   Bridge  15 reps      Lumbar Exercises: Sidelying   Hip Abduction  10 reps      Lumbar Exercises: Prone   Straight Leg Raise  10 reps    Other Prone Lumbar Exercises  prone press ups 1x10              PT Education - 11/06/17 0841    Education provided  Yes    Education Details  progress towards goals, POC moving forward     Person(s) Educated  Patient    Methods  Explanation    Comprehension  Verbalized understanding       PT Short Term Goals - 11/06/17 0820      PT SHORT TERM GOAL #1   Title  Patient to be compliant with appropriate HEP, to be updated PRN     Baseline  5/28- compliant     Time  3    Period  Weeks    Status  Achieved      PT SHORT TERM GOAL #2   Title  Patient to be able to perform bed mobility and functional lifting with correct mechanics in order to prevent exacerbation of back pain     Baseline  5/28- ongoing     Time  3    Period  Weeks    Status  On-going      PT SHORT TERM GOAL #3   Title  Patient to demonstrate lumbar ROM as being full on all planes and at least 50% improvement in bilateral HS and hip flexibility in order to improve mobility and overall  mechanics     Baseline  5/28- ongoing stiffness/limited mobilty     Time  3    Period  Weeks    Status  On-going        PT Long Term Goals - 11/06/17 9629      PT LONG TERM GOAL #1   Title  Patient to report that radicular symptoms in L LE extend no furhter than buttocks in order to show improvement of symptoms     Baseline  5/28- not as regular, has not gone down to ankle, only sometimes goes to behind the knee     Time  6    Period  Weeks    Status  On-going      PT LONG TERM GOAL #2   Title  Paitent to report pain in back and L LE as being no more than 2/10 at worst in order to show improvement of symptoms and improve QOL     Baseline  5/28- 6-7/10 at worst     Time  6    Period  Weeks    Status  On-going      PT LONG TERM GOAL #3   Title  Patient to demonstrate all tested muscle groups as being 5/5 in order to show improvement in functional strength     Baseline  5/28- ongoing muscle weakness       PT LONG TERM GOAL #4   Title  Patient to be able to maintain correct posture at least 70% of the time without external cues in order to improve mechanics and reduce strain on low back     Baseline  5/28- improving but needs work     Time  6    Period  Weeks    Status  Achieved            Plan - 11/06/17 5284    Clinical Impression Statement  Re-assessment performed today. Patient appears to have made some slow progress with skilled PT services, the most relevant of which appears to be reductions in pain and extent that back pain is peripheralizing down his left leg. He does continue to demonstrate ongoing impairments in functional strength, lumbar and hip mobility/flexibility, posture, and also continues to demonstrate ongoing pain in back and L LE. Unsure of patient's true compliance with HEP as he does appear to require ongoing cues for exercise form in clinic. He may benefit from skilled PT services for an additional 3 weeks/6 visits; plan to DC if no additional progress  is made following that time period.     Rehab Potential  Good    PT Frequency  2x / week    PT Duration  3 weeks    PT Treatment/Interventions  ADLs/Self Care  Home Management;Cryotherapy;Electrical Stimulation;Iontophoresis 4mg /ml Dexamethasone;Moist Heat;Ultrasound;DME Instruction;Gait training;Stair training;Functional mobility training;Therapeutic activities;Therapeutic exercise;Balance training;Neuromuscular re-education;Patient/family education;Manual techniques;Passive range of motion;Dry needling;Taping    PT Next Visit Plan  continue extension based focus, continue POE and prone pressups. Continue to work lumbar moblity and strength, mechanics     PT Home Exercise Plan  Eval: HS stretch, SKTC, lumbar rotations, lumbar roll for back support, figure 4 stretch, bridges, supine clams with red TB, POE     Consulted and Agree with Plan of Care  Patient       Patient will benefit from skilled therapeutic intervention in order to improve the following deficits and impairments:  Abnormal gait, Increased fascial restricitons, Improper body mechanics, Pain, Decreased coordination, Increased muscle spasms, Postural dysfunction, Decreased strength, Hypomobility, Difficulty walking, Impaired flexibility  Visit Diagnosis: Chronic left-sided low back pain with left-sided sciatica - Plan: PT plan of care cert/re-cert  Abnormal posture - Plan: PT plan of care cert/re-cert  Muscle weakness (generalized) - Plan: PT plan of care cert/re-cert  Other muscle spasm - Plan: PT plan of care cert/re-cert     Problem List Patient Active Problem List   Diagnosis Date Noted  . Left lumbar radiculopathy 08/02/2017  . Atherosclerosis 06/03/2017  . Low-level of literacy 05/12/2017  . Osteoarthritis 04/20/2017  . Diabetes ([place]) 03/12/2017  . Acute non-recurrent frontal sinusitis 01/04/2017  . COPD exacerbation ([place]) 12/14/2016  . Acute upper respiratory infection 06/20/2016  . Back pain 02/29/2016  .  Abnormal stress test 10/18/2015  . Erectile dysfunction 02/25/2015  . Cigarette smoker 02/10/2015  . COPD GOLD 0 11/08/2014  . Hemorrhoids 02/11/2014  . 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 Headache 10/23/2011  . 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[place]) 10/23/2011  . GERD (gastroesophageal reflux disease) 10/23/2011  . Nicotine addiction 10/23/2011  . 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 Cervical stenosis of spinal canal 10/23/2011  . Essential hypertension, benign 07/10/2011    Deniece Ree PT, DPT, CBIS  Supplemental Physical Therapist [place]   Pager [facility] [address] [place], [place], [address] Phone: [phone]   Fax:  [phone]  Name: Andre [name] MRN: [id number] Date of Birth: 24-Aug-1940

## 2017-11-08 ENCOUNTER — Telehealth: Payer: Self-pay | Admitting: Physical Therapy

## 2017-11-08 ENCOUNTER — Ambulatory Visit: Payer: Medicare Other | Admitting: Physical Therapy

## 2017-11-08 NOTE — Telephone Encounter (Signed)
Spoke to patient regarding no-sow to this morning appointment. He reports his car broke down and he did not have our phone number. He plans to attend his next appointment on June 5th.

## 2017-11-13 NOTE — Progress Notes (Signed)
[name] [place], [place] [address] Phone: [phone] Subjective:     CC: Back pain follow-up  NIO:[id number]  Andre [name] is a 77 y.o. male coming in with complaint of back pain.  Patient was seen previously.  Does have known degenerative disc disease.  Has been going to formal physical therapy and states that it has not helped out significantly.  Taking gabapentin regularly at night and has noticed improvement as well.  States that he is feeling 80% better.  Not having as much severe pain.  Noticing during the daytime doing better as well    Past Medical History:  Diagnosis Date  . Arthritis   . Coronary artery disease, non-occlusive    a. 06/2011: cath for abnormal exercise echo: Only 30-40% proximal RCA. Otherwise normal. b. 10/2015: cath for abnormal NST and atypical CP --> showed mild 30-40% stenosis in the prox-distal RCA  . GERD (gastroesophageal reflux disease)   . HA (headache)   . Hypertension   . 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[place])   . TIA (transient ischemic attack)   . Weakness    Past Surgical History:  Procedure Laterality Date  . CARDIAC CATHETERIZATION  January 2013   Proximal RCA 30-40%. Otherwise normal.  . CARDIAC CATHETERIZATION N/A 10/18/2015   Procedure: Left Heart Cath and Coronary Angiography;  Surgeon: Jettie Booze, MD;  Location: [facility];  Service: Cardiovascular;  Laterality: N/A;  . NM MYOVIEW LTD  March 2015   LOW RISK. No scar or ischemia. Normal EF (55%). No RWMA  . surgical excision of rectal cancer     Social History   Socioeconomic History  . Marital status: Single    Spouse name: Not on file  . Number of children: 11  . Years of education: 11  . Highest education level: Not on file  Occupational History  . Occupation: Retried    Comment: retired  Scientific laboratory technician  . Financial resource strain: Not on file  . Food insecurity:    Worry: Not on file    Inability: Not on file  .  Transportation needs:    Medical: Not on file    Non-medical: Not on file  Tobacco Use  . Smoking status: Current Some Day Smoker    Types: Cigarettes  . Smokeless tobacco: Never Used  Substance and Sexual Activity  . Alcohol use: Yes    Alcohol/week: 1.2 oz    Types: 2 Cans of beer per week  . Drug use: No  . Sexual activity: Not on file  Lifestyle  . Physical activity:    Days per week: Not on file    Minutes per session: Not on file  . Stress: Not on file  Relationships  . Social connections:    Talks on phone: Not on file    Gets together: Not on file    Attends religious service: Not on file    Active member of club or organization: Not on file    Attends meetings of clubs or organizations: Not on file    Relationship status: Not on file  Other Topics Concern  . Not on file  Social History Narrative   Patient is single, retired   Patient is right handed   Education level is high school   Caffeine consumption is 1 cup daily   No Known Allergies Family History  Problem Relation Age of Onset  . Hypertension Mother   . Cancer Mother   . Liver  disease Father      Past medical history, social, surgical and family history all reviewed in electronic medical record.  No pertanent information unless stated regarding to the chief complaint.   Review of Systems:Review of systems updated and as accurate as of 11/13/17  No headache, visual changes, nausea, vomiting, diarrhea, constipation, dizziness, abdominal pain, skin rash, fevers, chills, night sweats, weight loss, swollen lymph nodes, body aches, joint swelling,  chest pain, shortness of breath, mood changes.  Mild positive muscle aches  Objective  There were no vitals taken for this visit. Systems examined below as of 11/13/17   General: No apparent distress alert and oriented x3 mood and affect normal, dressed appropriately.  HEENT: Pupils equal, extraocular movements intact  Respiratory: Patient's speak in full  sentences and does not appear short of breath  Cardiovascular: No lower extremity edema, non tender, no erythema  Skin: Warm dry intact with no signs of infection or rash on extremities or on axial skeleton.  Abdomen: Soft nontender  Neuro: Cranial nerves II through XII are intact, neurovascularly intact in all extremities with 2+ DTRs and 2+ pulses.  Lymph: No lymphadenopathy of posterior or anterior cervical chain or axillae bilaterally.  Gait normal with good balance and coordination.  MSK:  Non tender with full range of motion and good stability and symmetric strength and tone of shoulders, elbows, wrist, hip, knee and ankles bilaterally.  Arthritic changes of multiple joints  Patient back exam shows loss of lordosis with some mild degenerative scoliosis.  Positive Faber test on the right side.  Negative straight leg test.  Neurovascular intact distally in the extremities with 4+ out of 5 strength but is symmetric.  Deep tendon reflexes intact    Impression and Recommendations:     This case required medical decision making of moderate complexity.      Note: This dictation was prepared with Dragon dictation along with smaller phrase technology. Any transcriptional errors that result from this process are unintentional.

## 2017-11-14 ENCOUNTER — Encounter: Payer: Self-pay | Admitting: Physical Therapy

## 2017-11-14 ENCOUNTER — Ambulatory Visit: Payer: Medicare Other | Attending: Family Medicine | Admitting: Physical Therapy

## 2017-11-14 DIAGNOSIS — M6281 Muscle weakness (generalized): Secondary | ICD-10-CM | POA: Diagnosis not present

## 2017-11-14 DIAGNOSIS — G8929 Other chronic pain: Secondary | ICD-10-CM | POA: Insufficient documentation

## 2017-11-14 DIAGNOSIS — M5442 Lumbago with sciatica, left side: Secondary | ICD-10-CM | POA: Diagnosis not present

## 2017-11-14 DIAGNOSIS — M62838 Other muscle spasm: Secondary | ICD-10-CM | POA: Insufficient documentation

## 2017-11-14 DIAGNOSIS — R293 Abnormal posture: Secondary | ICD-10-CM | POA: Diagnosis not present

## 2017-11-14 NOTE — Therapy (Signed)
[place], [place], [address] Phone: [phone]   Fax:  [phone]  Physical Therapy Treatment  Patient Details  Name: Andre [name] MRN: [id number] Date of Birth: [birth year] Referring Provider: Hulan Saas    Encounter Date: 11/14/2017  PT End of Session - 11/14/17 0947    Visit Number  7    Number of Visits  13    Date for PT Re-Evaluation  11/27/17    Authorization Type  UHC Medicare/AARP (PN due 16th session)    Authorization Time Period  01/10/81 to 9/93/71; recert done 6/96    PT Start Time  0930    PT Stop Time  1010    PT Time Calculation (min)  40 min       Past Medical History:  Diagnosis Date  . Arthritis   . Coronary artery disease, non-occlusive    a. 06/2011: cath for abnormal exercise echo: Only 30-40% proximal RCA. Otherwise normal. b. 10/2015: cath for abnormal NST and atypical CP --> showed mild 30-40% stenosis in the prox-distal RCA  . GERD (gastroesophageal reflux disease)   . HA (headache)   . Hypertension   . 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[place])   . TIA (transient ischemic attack)   . Weakness     Past Surgical History:  Procedure Laterality Date  . CARDIAC CATHETERIZATION  January 2013   Proximal RCA 30-40%. Otherwise normal.  . CARDIAC CATHETERIZATION N/A 10/18/2015   Procedure: Left Heart Cath and Coronary Angiography;  Surgeon: Jettie Booze, MD;  Location: [facility];  Service: Cardiovascular;  Laterality: N/A;  . NM MYOVIEW LTD  March 2015   LOW RISK. No scar or ischemia. Normal EF (55%). No RWMA  . surgical excision of rectal cancer      There were no vitals filed for this visit.  Subjective Assessment - 11/14/17 0933    Subjective  Some days are worse than others. Today with the cloudy weather it hurts more.     Currently in Pain?  Yes    Pain Score  8     Pain Location  Back         [facility] PT Assessment - 11/14/17 0001      Strength   Right Hip Flexion   4+/5    Left Hip Flexion  4+/5                   [facility] Adult PT Treatment/Exercise - 11/14/17 0001      Self-Care   Self-Care  Other Self-Care Comments    Other Self-Care Comments   Use of tennis ball for self massage to left gluteal/piriformis side lying and seated       Lumbar Exercises: Stretches   Active Hamstring Stretch  60 seconds;Right;Left    Single Knee to Chest Stretch  3 reps;20 seconds    Prone on Elbows Stretch  60 seconds    Piriformis Stretch  3 reps;20 seconds    Figure 4 Stretch  3 reps;20 seconds    Other Lumbar Stretch Exercise  lumbar rotation stretch 3x10 seconds B       Lumbar Exercises: Supine   Clam  15 reps blue    Bridge  15 reps    Bridge with clamshell  10 reps blue    Single Leg Bridge  10 reps      Lumbar Exercises: Sidelying   Clam  10 reps 2 sets      Manual Therapy  Soft tissue mobilization  left piriformis and left glut med with passive IR and ER prone.                PT Short Term Goals - 11/14/17 1039      PT SHORT TERM GOAL #1   Title  Patient to be compliant with appropriate HEP, to be updated PRN     Baseline  5/28- compliant with most exercises    Time  3    Period  Weeks    Status  Achieved      PT SHORT TERM GOAL #2   Title  Patient to be able to perform bed mobility and functional lifting with correct mechanics in order to prevent exacerbation of back pain     Baseline  5/28- ongoing     Time  3    Period  Weeks    Status  On-going      PT SHORT TERM GOAL #3   Title  Patient to demonstrate lumbar ROM as being full on all planes and at least 50% improvement in bilateral HS and hip flexibility in order to improve mobility and overall mechanics     Baseline  5/28- ongoing stiffness/limited mobilty , updated HEP    Time  3    Period  Weeks    Status  On-going        PT Long Term Goals - 11/14/17 1040      PT LONG TERM GOAL #1   Title  Patient to report that radicular symptoms in L LE extend no  furhter than buttocks in order to show improvement of symptoms     Baseline  5/28- not as regular, has not gone down to ankle, only sometimes goes to behind the knee     Time  6    Period  Weeks    Status  On-going      PT LONG TERM GOAL #2   Title  Paitent to report pain in back and L LE as being no more than 2/10 at worst in order to show improvement of symptoms and improve QOL     Baseline  5/28- 8/10 today    Time  6    Period  Weeks    Status  On-going      PT LONG TERM GOAL #3   Title  Patient to demonstrate all tested muscle groups as being 5/5 in order to show improvement in functional strength     Baseline  5/28- ongoing muscle weakness in hip extensors/glut med, other improved     Time  6    Period  Weeks    Status  On-going      PT LONG TERM GOAL #4   Title  Patient to be able to maintain correct posture at least 70% of the time without external cues in order to improve mechanics and reduce strain on low back     Baseline  5/28- improving, pays attention to posture more     Time  6    Period  Weeks    Status  Achieved            Plan - 11/14/17 1020    Clinical Impression Statement  Pt arrives reporting increased pain due to the change in weather. Pain still left lumbar/ gluteal area. Pain can still reach knee. hip flexion strength improved. Gluteals still weak, especially left. Tightness still present in hip rotation and hamstrings, left more than right. Reviewed stretches and he  is not performing figure 4 stretch due to difficulty. Instructed him in the modified version which he was able to complete. Also added knee to opposite shoulder stretch. Left hip extension still weak so added singel leg bridge to HEP. Also began sidelying clam. After manual today pt reported feeling decreased pain. He was issued a tennis ball to perform self massage /trigger point release.     PT Next Visit Plan  continue extension based focus, continue POE and prone pressups. Continue to work  lumbar moblity and strength, mechanics     PT Home Exercise Plan  Eval: HS stretch, SKTC, lumbar rotations, lumbar roll for back support, figure 4 stretch (shown modified version) , bridges, supine clams with red TB, POE , side clam, single leg bridge, knee to opposite shoulder    Consulted and Agree with Plan of Care  Patient       Patient will benefit from skilled therapeutic intervention in order to improve the following deficits and impairments:  Abnormal gait, Increased fascial restricitons, Improper body mechanics, Pain, Decreased coordination, Increased muscle spasms, Postural dysfunction, Decreased strength, Hypomobility, Difficulty walking, Impaired flexibility  Visit Diagnosis: Chronic left-sided low back pain with left-sided sciatica  Abnormal posture  Muscle weakness (generalized)  Other muscle spasm     Problem List Patient Active Problem List   Diagnosis Date Noted  . Left lumbar radiculopathy 08/02/2017  . Atherosclerosis 06/03/2017  . Low-level of literacy 05/12/2017  . Osteoarthritis 04/20/2017  . Diabetes ([place]) 03/12/2017  . Acute non-recurrent frontal sinusitis 01/04/2017  . COPD exacerbation ([place]) 12/14/2016  . Acute upper respiratory infection 06/20/2016  . Back pain 02/29/2016  . Abnormal stress test 10/18/2015  . Erectile dysfunction 02/25/2015  . Cigarette smoker 02/10/2015  . COPD GOLD 0 11/08/2014  . Hemorrhoids 02/11/2014  . 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 Headache 10/23/2011  . 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[place]) 10/23/2011  . GERD (gastroesophageal reflux disease) 10/23/2011  . Nicotine addiction 10/23/2011  . 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 Cervical stenosis of spinal canal 10/23/2011  . Essential hypertension, benign 07/10/2011    Dorene Ar, PTA 11/14/2017, 10:42 AM  [facility] [address] [place], [place], [address] Phone: [phone]   Fax:  [phone]  Name: Andre [name] MRN: [id number] Date of Birth: 13-Oct-1940

## 2017-11-15 ENCOUNTER — Encounter: Payer: Self-pay | Admitting: Family Medicine

## 2017-11-15 ENCOUNTER — Ambulatory Visit (INDEPENDENT_AMBULATORY_CARE_PROVIDER_SITE_OTHER): Payer: Medicare Other | Admitting: Family Medicine

## 2017-11-15 DIAGNOSIS — M5136 Other intervertebral disc degeneration, lumbar region: Secondary | ICD-10-CM | POA: Diagnosis not present

## 2017-11-15 NOTE — Patient Instructions (Signed)
Good to see you  Ice is your friend As long as you do well see me as you need me  Continue the gabapentin

## 2017-11-15 NOTE — Assessment & Plan Note (Signed)
Known degenerative disc disease.  Discussed icing regimen and home exercise.  Discussed which activities of doing which was to avoid.  Patient is to increase activity slowly over the course the next several days.  Can continue with physical therapy but thinks is beneficial.  Patient will continue gabapentin at night.  As long as patient does well will follow-up as needed

## 2017-11-19 ENCOUNTER — Telehealth: Payer: Self-pay | Admitting: Physical Therapy

## 2017-11-19 ENCOUNTER — Ambulatory Visit: Payer: Medicare Other | Admitting: Physical Therapy

## 2017-11-19 NOTE — Telephone Encounter (Signed)
No-show. Called and spoke to patient, who reports he is not feeling good with chest pain. He did not call to let us know because he's trying to figure out if he needs to go to the doctor or not. If he is feeling better he reports he plans to be at next appointment.  Deniece Ree PT, DPT, CBIS  Supplemental Physical Therapist [facility]   Pager [phone]

## 2017-11-21 ENCOUNTER — Ambulatory Visit: Payer: Medicare Other | Admitting: Physical Therapy

## 2017-11-21 ENCOUNTER — Encounter: Payer: Self-pay | Admitting: Physical Therapy

## 2017-11-21 DIAGNOSIS — R293 Abnormal posture: Secondary | ICD-10-CM

## 2017-11-21 DIAGNOSIS — G8929 Other chronic pain: Secondary | ICD-10-CM

## 2017-11-21 DIAGNOSIS — M6281 Muscle weakness (generalized): Secondary | ICD-10-CM

## 2017-11-21 DIAGNOSIS — M5442 Lumbago with sciatica, left side: Secondary | ICD-10-CM | POA: Diagnosis not present

## 2017-11-21 DIAGNOSIS — M62838 Other muscle spasm: Secondary | ICD-10-CM | POA: Diagnosis not present

## 2017-11-21 NOTE — Therapy (Signed)
[place], [place], [address] Phone: [phone]   Fax:  [phone]  Physical Therapy Treatment  Patient Details  Name: Andre [name] MRN: [id number] Date of Birth: 03/22/1941 Referring Provider: Hulan Saas    Encounter Date: 11/21/2017  PT End of Session - 11/21/17 0806    Visit Number  8    Number of Visits  13    Date for PT Re-Evaluation  11/27/17    Authorization Type  [employer] Medicare/AARP (PN due 16th session)    Authorization Time Period  02/15/27 to 09/23/22; recert done 4/01    PT Start Time  0800    PT Stop Time  0845    PT Time Calculation (min)  45 min       Past Medical History:  Diagnosis Date  . Arthritis   . Coronary artery disease, non-occlusive    a. 06/2011: cath for abnormal exercise echo: Only 30-40% proximal RCA. Otherwise normal. b. 10/2015: cath for abnormal NST and atypical CP --> showed mild 30-40% stenosis in the prox-distal RCA  . GERD (gastroesophageal reflux disease)   . HA (headache)   . Hypertension   . 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[place])   . TIA (transient ischemic attack)   . Weakness     Past Surgical History:  Procedure Laterality Date  . CARDIAC CATHETERIZATION  January 2013   Proximal RCA 30-40%. Otherwise normal.  . CARDIAC CATHETERIZATION N/A 10/18/2015   Procedure: Left Heart Cath and Coronary Angiography;  Surgeon: Jettie Booze, MD;  Location: [facility];  Service: Cardiovascular;  Laterality: N/A;  . NM MYOVIEW LTD  March 2015   LOW RISK. No scar or ischemia. Normal EF (55%). No RWMA  . surgical excision of rectal cancer      There were no vitals filed for this visit.  Subjective Assessment - 11/21/17 0805    Currently in Pain?  Yes    Pain Score  4     Pain Location  Back    Pain Orientation  Right;Left    Pain Descriptors / Indicators  Sore    Pain Radiating Towards  intermittent down to left knee    Aggravating Factors   prolonged standing, gets  tight     Pain Relieving Factors  heat, warm weather                       [facility] Adult PT Treatment/Exercise - 11/21/17 0001      Lumbar Exercises: Stretches   Active Hamstring Stretch  60 seconds;Right;Left    Single Knee to Chest Stretch  2 reps;30 seconds    Lower Trunk Rotation  5 reps;10 seconds    Prone on Elbows Stretch  60 seconds    Piriformis Stretch  3 reps;20 seconds    Figure 4 Stretch  3 reps;20 seconds      Lumbar Exercises: Aerobic   Nustep  UE/LE x 5 min L5      Lumbar Exercises: Seated   Sit to Stand  10 reps      Lumbar Exercises: Supine   Clam  20 reps blue    Bridge  15 reps    Bridge with clamshell  15 reps blue    Single Leg Bridge  10 reps 2 sets    Straight Leg Raise  10 reps;Other (comment) eccentric lower ,  2 sets       Lumbar Exercises: Sidelying   Clam  15 reps  Hip Abduction  10 reps      Manual Therapy   Soft tissue mobilization  left piriformis and left glut med with passive IR and ER prone.                PT Short Term Goals - 11/14/17 1039      PT SHORT TERM GOAL #1   Title  Patient to be compliant with appropriate HEP, to be updated PRN     Baseline  5/28- compliant with most exercises    Time  3    Period  Weeks    Status  Achieved      PT SHORT TERM GOAL #2   Title  Patient to be able to perform bed mobility and functional lifting with correct mechanics in order to prevent exacerbation of back pain     Baseline  5/28- ongoing     Time  3    Period  Weeks    Status  On-going      PT SHORT TERM GOAL #3   Title  Patient to demonstrate lumbar ROM as being full on all planes and at least 50% improvement in bilateral HS and hip flexibility in order to improve mobility and overall mechanics     Baseline  5/28- ongoing stiffness/limited mobilty , updated HEP    Time  3    Period  Weeks    Status  On-going        PT Long Term Goals - 11/14/17 1040      PT LONG TERM GOAL #1   Title  Patient to  report that radicular symptoms in L LE extend no furhter than buttocks in order to show improvement of symptoms     Baseline  5/28- not as regular, has not gone down to ankle, only sometimes goes to behind the knee     Time  6    Period  Weeks    Status  On-going      PT LONG TERM GOAL #2   Title  Paitent to report pain in back and L LE as being no more than 2/10 at worst in order to show improvement of symptoms and improve QOL     Baseline  5/28- 8/10 today    Time  6    Period  Weeks    Status  On-going      PT LONG TERM GOAL #3   Title  Patient to demonstrate all tested muscle groups as being 5/5 in order to show improvement in functional strength     Baseline  5/28- ongoing muscle weakness in hip extensors/glut med, other improved     Time  6    Period  Weeks    Status  On-going      PT LONG TERM GOAL #4   Title  Patient to be able to maintain correct posture at least 70% of the time without external cues in order to improve mechanics and reduce strain on low back     Baseline  5/28- improving, pays attention to posture more     Time  6    Period  Weeks    Status  Achieved            Plan - 11/21/17 0829    Clinical Impression Statement  Pt reports decreased pain. He has been using his tennis ball on his gluteals. Pain to knee has become intermittent. He describes his pain as soreness. Continued tenderness to palpation left gluteal/left piriformis.  Briefly discussed body mechanics, need more reinforcement /demonstration. Reviewed HEP and he required cues for pirifomis stretches and was not doing the clams. No increased pain at end of session.     PT Next Visit Plan  continue extension based focus, continue POE and prone pressups. Continue to work lumbar moblity and strength, mechanics     PT Home Exercise Plan  Eval: HS stretch, SKTC, lumbar rotations, lumbar roll for back support, figure 4 stretch (shown modified version) , bridges, supine clams with red TB, POE , side clam,  single leg bridge, knee to opposite shoulder    Consulted and Agree with Plan of Care  Patient       Patient will benefit from skilled therapeutic intervention in order to improve the following deficits and impairments:  Abnormal gait, Increased fascial restricitons, Improper body mechanics, Pain, Decreased coordination, Increased muscle spasms, Postural dysfunction, Decreased strength, Hypomobility, Difficulty walking, Impaired flexibility  Visit Diagnosis: Chronic left-sided low back pain with left-sided sciatica  Abnormal posture  Muscle weakness (generalized)  Other muscle spasm     Problem List Patient Active Problem List   Diagnosis Date Noted  . 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 Left lumbar radiculopathy 08/02/2017  . Atherosclerosis 06/03/2017  . Low-level of literacy 05/12/2017  . Osteoarthritis 04/20/2017  . Diabetes ([place]) 03/12/2017  . Acute non-recurrent frontal sinusitis 01/04/2017  . COPD exacerbation ([place]) 12/14/2016  . Acute upper respiratory infection 06/20/2016  . Back pain 02/29/2016  . Abnormal stress test 10/18/2015  . Erectile dysfunction 02/25/2015  . Cigarette smoker 02/10/2015  . COPD GOLD 0 11/08/2014  . Hemorrhoids 02/11/2014  . 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 Headache 10/23/2011  . 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[place]) 10/23/2011  . GERD (gastroesophageal reflux disease) 10/23/2011  . Nicotine addiction 10/23/2011  . 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 Cervical stenosis of spinal canal 10/23/2011  . Essential hypertension, benign 07/10/2011    Dorene Ar, PTA 11/21/2017, 8:58 AM  [facility] [address] [place], [place], [address] Phone: [phone]   Fax:  [phone]  Name: Andre [name] MRN: [id number] Date of Birth: 25-Jan-1941

## 2017-11-26 ENCOUNTER — Encounter: Payer: Self-pay | Admitting: Physical Therapy

## 2017-11-26 ENCOUNTER — Ambulatory Visit: Payer: Medicare Other | Admitting: Physical Therapy

## 2017-11-26 DIAGNOSIS — M5442 Lumbago with sciatica, left side: Secondary | ICD-10-CM | POA: Diagnosis not present

## 2017-11-26 DIAGNOSIS — G8929 Other chronic pain: Secondary | ICD-10-CM | POA: Diagnosis not present

## 2017-11-26 DIAGNOSIS — M6281 Muscle weakness (generalized): Secondary | ICD-10-CM

## 2017-11-26 DIAGNOSIS — M62838 Other muscle spasm: Secondary | ICD-10-CM | POA: Diagnosis not present

## 2017-11-26 DIAGNOSIS — R293 Abnormal posture: Secondary | ICD-10-CM

## 2017-11-26 NOTE — Therapy (Signed)
[place], [place], [address] Phone: [phone]   Fax:  [phone]  Physical Therapy Treatment  Patient Details  Name: Andre [name] MRN: [id number] Date of Birth: 02/22/1941 Referring Provider: Hulan Saas    Encounter Date: 11/26/2017  PT End of Session - 11/26/17 0853    Visit Number  9    Number of Visits  13    Date for PT Re-Evaluation  11/27/17    Authorization Type  [employer] Medicare/AARP (PN due 16th session)    Authorization Time Period  08/12/33 to 4/56/25; recert done 6/38    PT Start Time  0845    PT Stop Time  0930    PT Time Calculation (min)  45 min       Past Medical History:  Diagnosis Date  . Arthritis   . Coronary artery disease, non-occlusive    a. 06/2011: cath for abnormal exercise echo: Only 30-40% proximal RCA. Otherwise normal. b. 10/2015: cath for abnormal NST and atypical CP --> showed mild 30-40% stenosis in the prox-distal RCA  . GERD (gastroesophageal reflux disease)   . HA (headache)   . Hypertension   . 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[place])   . TIA (transient ischemic attack)   . Weakness     Past Surgical History:  Procedure Laterality Date  . CARDIAC CATHETERIZATION  January 2013   Proximal RCA 30-40%. Otherwise normal.  . CARDIAC CATHETERIZATION N/A 10/18/2015   Procedure: Left Heart Cath and Coronary Angiography;  Surgeon: Jettie Booze, MD;  Location: [facility];  Service: Cardiovascular;  Laterality: N/A;  . NM MYOVIEW LTD  March 2015   LOW RISK. No scar or ischemia. Normal EF (55%). No RWMA  . surgical excision of rectal cancer      There were no vitals filed for this visit.  Subjective Assessment - 11/26/17 0852    Subjective  Just a little pain running down my legs today. Overall been feeling better.     Currently in Pain?  Yes    Pain Score  5     Pain Location  Leg    Pain Orientation  Right;Left;Lateral    Pain Descriptors / Indicators  Radiating          [facility] PT Assessment - 11/26/17 0001      Strength   Right Hip Flexion  4+/5    Right Hip Extension  4+/5    Right Hip ABduction  4+/5    Left Hip Flexion  4+/5    Left Hip Extension  4-/5    Left Hip ABduction  4+/5                   [facility] Adult PT Treatment/Exercise - 11/26/17 0001      Lumbar Exercises: Stretches   Active Hamstring Stretch  60 seconds;Right;Left    Single Knee to Chest Stretch  2 reps;30 seconds    Piriformis Stretch  3 reps;20 seconds    Figure 4 Stretch  3 reps;20 seconds    Other Lumbar Stretch Exercise  open books       Lumbar Exercises: Aerobic   Nustep  UE/LE x 5 min L5      Lumbar Exercises: Supine   Clam  20 reps blue    Bridge  20 reps    Single Leg Bridge  10 reps 2 sets    Straight Leg Raise  10 reps;Other (comment) eccentric lower ,  2 sets  Lumbar Exercises: Sidelying   Clam  20 reps    Hip Abduction  20 reps      Lumbar Exercises: Prone   Straight Leg Raise  10 reps    Other Prone Lumbar Exercises  Prone on elbows                PT Short Term Goals - 11/14/17 1039      PT SHORT TERM GOAL #1   Title  Patient to be compliant with appropriate HEP, to be updated PRN     Baseline  5/28- compliant with most exercises    Time  3    Period  Weeks    Status  Achieved      PT SHORT TERM GOAL #2   Title  Patient to be able to perform bed mobility and functional lifting with correct mechanics in order to prevent exacerbation of back pain     Baseline  5/28- ongoing     Time  3    Period  Weeks    Status  On-going      PT SHORT TERM GOAL #3   Title  Patient to demonstrate lumbar ROM as being full on all planes and at least 50% improvement in bilateral HS and hip flexibility in order to improve mobility and overall mechanics     Baseline  5/28- ongoing stiffness/limited mobilty , updated HEP    Time  3    Period  Weeks    Status  On-going        PT Long Term Goals - 11/14/17 1040      PT LONG TERM  GOAL #1   Title  Patient to report that radicular symptoms in L LE extend no furhter than buttocks in order to show improvement of symptoms     Baseline  5/28- not as regular, has not gone down to ankle, only sometimes goes to behind the knee     Time  6    Period  Weeks    Status  On-going      PT LONG TERM GOAL #2   Title  Paitent to report pain in back and L LE as being no more than 2/10 at worst in order to show improvement of symptoms and improve QOL     Baseline  5/28- 8/10 today    Time  6    Period  Weeks    Status  On-going      PT LONG TERM GOAL #3   Title  Patient to demonstrate all tested muscle groups as being 5/5 in order to show improvement in functional strength     Baseline  5/28- ongoing muscle weakness in hip extensors/glut med, other improved     Time  6    Period  Weeks    Status  On-going      PT LONG TERM GOAL #4   Title  Patient to be able to maintain correct posture at least 70% of the time without external cues in order to improve mechanics and reduce strain on low back     Baseline  5/28- improving, pays attention to posture more     Time  6    Period  Weeks    Status  Achieved            Plan - 11/26/17 0911    Clinical Impression Statement  Pt reports overall improvement in pain and radicular symptoms. His left gluteal and piriformis continues with trigger points. He  is using a tennis ball for self massage. He still has radicular symptoms fom left gluteal to knee intermittently. His hip strength has improved. Still has weakness evident in left gluteal. He has one more visit remaining for re-evaluation with possible discharge.     PT Next Visit Plan  continue extension based focus, continue POE and prone pressups. Continue to work lumbar moblity and strength, mechanics     PT Home Exercise Plan  Eval: HS stretch, SKTC, lumbar rotations, lumbar roll for back support, figure 4 stretch (shown modified version) , bridges, supine clams with red TB, POE ,  side clam, single leg bridge, knee to opposite shoulder    Consulted and Agree with Plan of Care  Patient       Patient will benefit from skilled therapeutic intervention in order to improve the following deficits and impairments:  Abnormal gait, Increased fascial restricitons, Improper body mechanics, Pain, Decreased coordination, Increased muscle spasms, Postural dysfunction, Decreased strength, Hypomobility, Difficulty walking, Impaired flexibility  Visit Diagnosis: Chronic left-sided low back pain with left-sided sciatica  Abnormal posture  Other muscle spasm  Muscle weakness (generalized)     Problem List Patient Active Problem List   Diagnosis Date Noted  . 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 Left lumbar radiculopathy 08/02/2017  . Atherosclerosis 06/03/2017  . Low-level of literacy 05/12/2017  . Osteoarthritis 04/20/2017  . Diabetes ([place]) 03/12/2017  . Acute non-recurrent frontal sinusitis 01/04/2017  . COPD exacerbation ([place]) 12/14/2016  . Acute upper respiratory infection 06/20/2016  . Back pain 02/29/2016  . Abnormal stress test 10/18/2015  . Erectile dysfunction 02/25/2015  . Cigarette smoker 02/10/2015  . COPD GOLD 0 11/08/2014  . Hemorrhoids 02/11/2014  . 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 Headache 10/23/2011  . 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[place]) 10/23/2011  . GERD (gastroesophageal reflux disease) 10/23/2011  . Nicotine addiction 10/23/2011  . 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 Cervical stenosis of spinal canal 10/23/2011  . Essential hypertension, benign 07/10/2011    Dorene Ar, PTA 11/26/2017, 9:44 AM  [facility] [address] [place], [place], [address] Phone: [phone]   Fax:  [phone]  Name: Kendell Sagraves MRN: [id number] Date of Birth: July 11, 1940

## 2017-11-27 ENCOUNTER — Ambulatory Visit: Payer: Medicare Other | Admitting: Physical Therapy

## 2017-11-27 ENCOUNTER — Encounter: Payer: Self-pay | Admitting: Physical Therapy

## 2017-11-27 DIAGNOSIS — M62838 Other muscle spasm: Secondary | ICD-10-CM

## 2017-11-27 DIAGNOSIS — R293 Abnormal posture: Secondary | ICD-10-CM | POA: Diagnosis not present

## 2017-11-27 DIAGNOSIS — M6281 Muscle weakness (generalized): Secondary | ICD-10-CM | POA: Diagnosis not present

## 2017-11-27 DIAGNOSIS — M5442 Lumbago with sciatica, left side: Secondary | ICD-10-CM | POA: Diagnosis not present

## 2017-11-27 DIAGNOSIS — G8929 Other chronic pain: Secondary | ICD-10-CM | POA: Diagnosis not present

## 2017-11-27 NOTE — Therapy (Signed)
[place], [place], [address] Phone: [phone]   Fax:  [phone]  Physical Therapy Treatment (ERO/PN)  Patient Details  Name: Andre [name] MRN: [id number] Date of Birth: August 04, 1940 Referring Provider: Hulan Saas    Encounter Date: 11/27/2017   Progress Note Reporting Period 11/06/17 to 11/27/17  See note below for Objective Data and Assessment of Progress/Goals.       PT End of Session - 11/27/17 0841    Visit Number  10    Number of Visits  10    Date for PT Re-Evaluation  11/27/17    Authorization Type  [employer] Medicare/AARP (PN due 16th session)    Authorization Time Period  06/16/92 to 5/85/92; recert done 9/24    PT Start Time  0802    PT Stop Time  0840    PT Time Calculation (min)  38 min    Activity Tolerance  Patient tolerated treatment well    Behavior During Therapy  [facility] for tasks assessed/performed       Past Medical History:  Diagnosis Date  . Arthritis   . Coronary artery disease, non-occlusive    a. 06/2011: cath for abnormal exercise echo: Only 30-40% proximal RCA. Otherwise normal. b. 10/2015: cath for abnormal NST and atypical CP --> showed mild 30-40% stenosis in the prox-distal RCA  . GERD (gastroesophageal reflux disease)   . HA (headache)   . Hypertension   . 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[place])   . TIA (transient ischemic attack)   . Weakness     Past Surgical History:  Procedure Laterality Date  . CARDIAC CATHETERIZATION  January 2013   Proximal RCA 30-40%. Otherwise normal.  . CARDIAC CATHETERIZATION N/A 10/18/2015   Procedure: Left Heart Cath and Coronary Angiography;  Surgeon: Jettie Booze, MD;  Location: [place] [facility];  Service: Cardiovascular;  Laterality: N/A;  . NM MYOVIEW LTD  March 2015   LOW RISK. No scar or ischemia. Normal EF (55%). No RWMA  . surgical excision of rectal cancer      There were no vitals filed for this visit.  Subjective Assessment -  11/27/17 0803    Subjective  My back is feeling good, there is not a lot that is difficult for me to do at home. Since I've been coming here, it has not been as sore or stiff as it was. No falls or close calls.     How long can you sit comfortably?  6/18- unlimited     How long can you stand comfortably?  6/18- no limits     How long can you walk comfortably?  6/18- no limits     Patient Stated Goals  try to see what this is and work it out     Currently in Pain?  Yes    Pain Score  4     Pain Location  Back    Pain Orientation  Right;Left    Pain Descriptors / Indicators  Sore    Pain Type  Chronic pain    Pain Radiating Towards  none     Pain Onset  More than a month ago    Pain Frequency  Intermittent    Aggravating Factors   worse in the mornings when I first get up     Pain Relieving Factors  heat and warm weather     Effect of Pain on Daily Activities  mild          [facility]  PT Assessment - 11/27/17 0001      Assessment   Medical Diagnosis  back pain     Referring Provider  Hulan Saas     Onset Date/Surgical Date  -- about a year ago     Next MD Visit  Dr. Tamala Julian in a month     Prior Therapy  none       Precautions   Precautions  None      Restrictions   Weight Bearing Restrictions  No      Balance Screen   Has the patient fallen in the past 6 months  No    Has the patient had a decrease in activity level because of a fear of falling?   No    Is the patient reluctant to leave their home because of a fear of falling?   No      Prior Function   Level of Independence  Independent;Independent with basic ADLs;Independent with gait;Independent with transfers    Vocation  Retired    Leisure  none       AROM   Lumbar Flexion  full ROM     Lumbar Extension  WFL, mild stiffness     Lumbar - Right Side Bend  moderate stiffness     Lumbar - Left Side Bend  moderate stiffness       Strength   Right Hip Flexion  4+/5    Right Hip Extension  4/5    Right Hip ABduction   4+/5    Left Hip Flexion  4+/5    Left Hip Extension  3/5    Left Hip ABduction  4+/5      Flexibility   Hamstrings  mild tightness L, moderate R    Piriformis  moderate stifness B                    [facility] Adult PT Treatment/Exercise - 11/27/17 0001      Lumbar Exercises: Stretches   Single Knee to Chest Stretch  5 reps;10 seconds    Pelvic Tilt  10 reps    Piriformis Stretch  2 reps;30 seconds      Lumbar Exercises: Supine   Bridge  15 reps 2 sets, staggered stance     Straight Leg Raise  15 reps core set, eccentric lower       Lumbar Exercises: Sidelying   Hip Abduction  10 reps;Weights    Hip Abduction Weights (lbs)  2      Lumbar Exercises: Prone   Straight Leg Raise  10 reps;Other (comment) 2#              PT Education - 11/27/17 0841    Education provided  Yes    Education Details  on hold after today, call us if pain comes back within the next 2 weeks, goal review     Person(s) Educated  Patient    Methods  Explanation    Comprehension  Verbalized understanding       PT Short Term Goals - 11/27/17 0815      PT SHORT TERM GOAL #1   Title  Patient to be compliant with appropriate HEP, to be updated PRN     Baseline  6/18- compliant     Time  3    Period  Weeks    Status  Achieved      PT SHORT TERM GOAL #2   Title  Patient to be able to perform bed mobility and  functional lifting with correct mechanics in order to prevent exacerbation of back pain     Baseline  6/18- ongoing    Time  3    Period  Weeks    Status  On-going      PT SHORT TERM GOAL #3   Title  Patient to demonstrate lumbar ROM as being full on all planes and at least 50% improvement in bilateral HS and hip flexibility in order to improve mobility and overall mechanics     Baseline  6/18- improved, lateral flexion remains limited     Time  3    Period  Weeks    Status  Partially Met        PT Long Term Goals - 11/27/17 0817      PT LONG TERM GOAL #1   Title   Patient to report that radicular symptoms in L LE extend no furhter than buttocks in order to show improvement of symptoms     Baseline  6/18- stop well before buttocks     Time  6    Period  Weeks    Status  Achieved      PT LONG TERM GOAL #2   Title  Paitent to report pain in back and L LE as being no more than 2/10 at worst in order to show improvement of symptoms and improve QOL     Baseline  6/18- 4-5/10     Time  6    Period  Weeks    Status  On-going      PT LONG TERM GOAL #3   Title  Patient to demonstrate all tested muscle groups as being 5/5 in order to show improvement in functional strength     Baseline  6/18- flowsheets, generally 4+/5     Time  6    Period  Weeks    Status  Partially Met      PT LONG TERM GOAL #4   Title  Patient to be able to maintain correct posture at least 70% of the time without external cues in order to improve mechanics and reduce strain on low back     Baseline  6/18- met     Time  6    Period  Weeks    Status  Achieved            Plan - 11/27/17 0841    Clinical Impression Statement  Re-assessment performed today. Patient has progressed very well with skilled PT services and reports minimal dysfunction or limitations throughout his day, although he has not yet met some of his goals. He feels confident with his HEP at home and reports full compliance. At this time plan to put patient on hold rather than DC (per his request); if he continues to feel this well after a couple weeks of being out of PT, he will call clinic to let us know about readiness for DC.     Rehab Potential  Good    PT Treatment/Interventions  ADLs/Self Care Home Management;Cryotherapy;Electrical Stimulation;Iontophoresis 28m/ml Dexamethasone;Moist Heat;Ultrasound;DME Instruction;Gait training;Stair training;Functional mobility training;Therapeutic activities;Therapeutic exercise;Balance training;Neuromuscular re-education;Patient/family education;Manual techniques;Passive  range of motion;Dry needling;Taping    PT Next Visit Plan  currently on hold- will call [place] pain comes back     PT Home Exercise Plan  Eval: HS stretch, SKTC, lumbar rotations, lumbar roll for back support, figure 4 stretch (shown modified version) , bridges, supine clams with red TB, POE , side clam, single leg bridge, knee  to opposite shoulder    Consulted and Agree with Plan of Care  Patient       Patient will benefit from skilled therapeutic intervention in order to improve the following deficits and impairments:  Abnormal gait, Increased fascial restricitons, Improper body mechanics, Pain, Decreased coordination, Increased muscle spasms, Postural dysfunction, Decreased strength, Hypomobility, Difficulty walking, Impaired flexibility  Visit Diagnosis: Chronic left-sided low back pain with left-sided sciatica  Abnormal posture  Other muscle spasm  Muscle weakness (generalized)     Problem List Patient Active Problem List   Diagnosis Date Noted  . 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 Left lumbar radiculopathy 08/02/2017  . Atherosclerosis 06/03/2017  . Low-level of literacy 05/12/2017  . Osteoarthritis 04/20/2017  . Diabetes ([place]) 03/12/2017  . Acute non-recurrent frontal sinusitis 01/04/2017  . COPD exacerbation ([place]) 12/14/2016  . Acute upper respiratory infection 06/20/2016  . Back pain 02/29/2016  . Abnormal stress test 10/18/2015  . Erectile dysfunction 02/25/2015  . Cigarette smoker 02/10/2015  . COPD GOLD 0 11/08/2014  . Hemorrhoids 02/11/2014  . 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 Headache 10/23/2011  . 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[place]) 10/23/2011  . GERD (gastroesophageal reflux disease) 10/23/2011  . Nicotine addiction 10/23/2011  . 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 Cervical stenosis of spinal canal 10/23/2011  . Essential hypertension, benign 07/10/2011    Deniece Ree PT, DPT, CBIS  Supplemental Physical Therapist [place]   Pager  [facility] [address] [place], [place], [address] Phone: [phone]   Fax:  [phone]  Name: Andre [name] MRN: [id number] Date of Birth: 08/07/1940

## 2017-11-29 ENCOUNTER — Ambulatory Visit: Payer: Medicare Other | Admitting: Nurse Practitioner

## 2017-11-29 DIAGNOSIS — Z0289 Encounter for other administrative examinations: Secondary | ICD-10-CM

## 2017-12-11 ENCOUNTER — Other Ambulatory Visit: Payer: Self-pay | Admitting: Family Medicine

## 2017-12-11 NOTE — Telephone Encounter (Signed)
Refill done.  

## 2017-12-21 ENCOUNTER — Ambulatory Visit: Payer: Medicare Other | Admitting: Nurse Practitioner

## 2017-12-24 ENCOUNTER — Encounter: Payer: Self-pay | Admitting: Nurse Practitioner

## 2017-12-24 ENCOUNTER — Encounter: Payer: Self-pay | Admitting: Neurology

## 2017-12-24 ENCOUNTER — Ambulatory Visit (INDEPENDENT_AMBULATORY_CARE_PROVIDER_SITE_OTHER): Payer: Medicare Other | Admitting: Nurse Practitioner

## 2017-12-24 ENCOUNTER — Other Ambulatory Visit: Payer: Self-pay

## 2017-12-24 VITALS — BP 106/62 | HR 86 | Temp 98.9°F | Resp 16 | Ht 71.0 in | Wt 187.8 lb

## 2017-12-24 DIAGNOSIS — R51 Headache: Secondary | ICD-10-CM

## 2017-12-24 DIAGNOSIS — M79604 Pain in right leg: Secondary | ICD-10-CM | POA: Diagnosis not present

## 2017-12-24 DIAGNOSIS — M79605 Pain in left leg: Secondary | ICD-10-CM

## 2017-12-24 DIAGNOSIS — Z789 Other specified health status: Secondary | ICD-10-CM | POA: Diagnosis not present

## 2017-12-24 DIAGNOSIS — Z7289 Other problems related to lifestyle: Secondary | ICD-10-CM

## 2017-12-24 DIAGNOSIS — R519 Headache, unspecified: Secondary | ICD-10-CM

## 2017-12-24 DIAGNOSIS — Z9119 Patient's noncompliance with other medical treatment and regimen: Secondary | ICD-10-CM

## 2017-12-24 DIAGNOSIS — Z91199 Patient's noncompliance with other medical treatment and regimen due to unspecified reason: Secondary | ICD-10-CM | POA: Insufficient documentation

## 2017-12-24 DIAGNOSIS — R410 Disorientation, unspecified: Secondary | ICD-10-CM

## 2017-12-24 DIAGNOSIS — E119 Type 2 diabetes mellitus without complications: Secondary | ICD-10-CM | POA: Diagnosis not present

## 2017-12-24 DIAGNOSIS — E785 Hyperlipidemia, unspecified: Secondary | ICD-10-CM | POA: Insufficient documentation

## 2017-12-24 DIAGNOSIS — I10 Essential (primary) hypertension: Secondary | ICD-10-CM | POA: Diagnosis not present

## 2017-12-24 NOTE — Assessment & Plan Note (Signed)
Stable Continue current medications Continue to monitor

## 2017-12-24 NOTE — Patient Outreach (Signed)
[place] [facility]) Care Management  12/24/2017  Andre [name] [birth year] [id number]   Telephone Screen  Referral Date: 12/24/17 Referral Source: MD office Referral Reason: " polypharmacy, diabetes, mental confusion" Insurance: [facility] Medicare   Outreach attempt # 1  to patient. Spoke with patient and screening completed.   Social: Patient voices that he resides in his home along with a "roommate." He reports that he is independent with ADLs/IADLs. He denies any recent falls. He voices that he does not use any assistive devices. Patient drives himself to medical appts.    Conditions:Per chart review, patient has PMH of DM, HTN, HLD, DDD, OA and rectal CA (2013). Also noted, in MD notes patient has history of "drinking" and alcohol abuse. Patient states that he does not monitor cbgs. He voices that his daughter in law comes about once a week and checks his blood sugars using her machine. He reports that he does not even have a cbg meter in the home. Last A1C was 7.3(March 2019). Patient had A1C level drawn today during appt-results pending. RN CM questioned patient in regards to rather or not PCP is aware of this and if he has requested one from MD. He states that "she never mentioned it so I never mentioned it." He reports that he has HTN but does not monitor BP in the home and unsure what BP runs.  Patient does not seem very knowledgeable regarding his chronic conditions. Also, concerns noted from MD in regards to his compliance. Patient either very guarded with discussing health info and/or poor historian during this call so RN CM unable to fully assess how patient managing conditions.    Medications: Per patient he is taking five meds. He states that he is taking meds directly from pill bottles. He voices that he is managing his meds fine and does not feel like he needs med assistance at this time.    Appointments: Patient saw PCP today. Per MD note PCP has referred patient to  neurology for headaches and confusion.   Consent: [facility] services reviewed and discussed. Patient agreeable to RN support for further mgmt of chronic conditions. He declined needing pharmacy or SW assistance at this time.    Plan: RN CM will send [facility] community RN referral for further in home eval/assessment of are care needs and mgmt of chronic conditions.    Enzo Montgomery, RN,BSN,CCM [place] Management Telephonic Care Management Coordinator Direct Phone: [phone] Toll Free: [phone] Fax: [phone]

## 2017-12-24 NOTE — Assessment & Plan Note (Signed)
Although he does c/o sore legs, this is not a new complaint for him, and does not seem to have worsened with statin Continue atorvastatin at current dosage at this time He did not return for repeat lipid panel in May as instructed Will update labs today, unsure if he is fasting F/U with further recommendations pending lab results - Comprehensive metabolic panel; Future - Lipid panel; Future

## 2017-12-24 NOTE — Assessment & Plan Note (Signed)
Noted over past several visits now I am trying my best to help him given these circumstances

## 2017-12-24 NOTE — Assessment & Plan Note (Signed)
Update labs today F/U with further recommendations pending lab results - Hemoglobin A1c; Future - AMB Referral to [place] Management

## 2017-12-24 NOTE — Progress Notes (Signed)
Name: Andre [name]   MRN: [id number]    DOB: [birth year]   Date:12/24/2017       Progress Note  Subjective  Chief Complaint  Chief Complaint  Patient presents with  . Follow-up    Follow up on lipitor, headaches that come and og    HPI Andre [name] is here today requesting a follow up of cholesterol and headaches. We will follow up on diabetes, HTN as well.  Cholesterol- started on atorvastatin 10 daily in March. He says he is not sure the names of all of his medications, but his pharmacy program sends his prescriptions to him monthly based on his current medication list and he says he takes all the medications the pharmacy sends him. He does complain of leg soreness, which is not new for him, and has not seemed to worsen since starting the atorvastatin  Lab Results  Component Value Date   CHOL 169 08/20/2017   HDL 39.50 08/20/2017   LDLCALC 91 08/20/2017   TRIG 194.0 (H) 08/20/2017   CHOLHDL 4 08/20/2017    Hypertension -maintained on losartan-hydrochlorothiazide 100-25, metoprolol succinate 25 daily. Reports daily medication compliance. Does not routinely check BP readings at home Denies vision changes, chest pain, shortness of breath, edema.  BP Readings from Last 3 Encounters:  12/24/17 106/62  11/15/17 120/62  10/04/17 118/62   Diabetes- A1c noted to be elevated in the past and on most recent check in march, although he says he was never told he had diabetes or that he should take diabetes medications Denies tremor, diaphoresis, polyuria, polydipsia, polyphagia.  Lab Results  Component Value Date   HGBA1C 7.3 (H) 08/20/2017   Headache- This is not a new complaint. He describes as dull frontal pain occurring about 2 times per week and relieve by medication, although he can not recall the name of the medication he takes. He says he has been having these headaches for 10-12 years now. The headaches occur at any time of day, and don't seem to be related to any specific  trigger. Referral was placed to neurology for further evaluation of headaches in February, but he says today he does not recall anyone calling to schedule appointement He does tell me today that he drinks whiskey every other day, "a few nips" and not sure if this is related to headaches. He denies use of any other substances, street drugs  Patient Active Problem List   Diagnosis Date Noted  . 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 Left lumbar radiculopathy 08/02/2017  . Atherosclerosis 06/03/2017  . Low-level of literacy 05/12/2017  . Osteoarthritis 04/20/2017  . Diabetes ([place]) 03/12/2017  . Acute non-recurrent frontal sinusitis 01/04/2017  . COPD exacerbation ([place]) 12/14/2016  . Acute upper respiratory infection 06/20/2016  . Back pain 02/29/2016  . Abnormal stress test 10/18/2015  . Erectile dysfunction 02/25/2015  . Cigarette smoker 02/10/2015  . COPD GOLD 0 11/08/2014  . Hemorrhoids 02/11/2014  . 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 Headache 10/23/2011  . 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[place]) 10/23/2011  . GERD (gastroesophageal reflux disease) 10/23/2011  . Nicotine addiction 10/23/2011  . 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 Cervical stenosis of spinal canal 10/23/2011  . Essential hypertension, benign 07/10/2011    Past Surgical History:  Procedure Laterality Date  . CARDIAC CATHETERIZATION  January 2013   Proximal RCA 30-40%. Otherwise normal.  . CARDIAC CATHETERIZATION N/A 10/18/2015   Procedure: Left Heart Cath and Coronary Angiography;  Surgeon: Andre Booze, MD;  Location:  [place] [facility];  Service: Cardiovascular;  Laterality: N/A;  . NM MYOVIEW LTD  March 2015   LOW RISK. No scar or ischemia. Normal EF (55%). No RWMA  . surgical excision of rectal cancer      Family History  Problem Relation Age of Onset  . Hypertension Mother   . Cancer Mother   . Liver disease Father     Social History   Socioeconomic History  . Marital status: Single    Spouse  name: Not on file  . Number of children: 11  . Years of education: 4  . Highest education level: Not on file  Occupational History  . Occupation: Retried    Comment: retired  Scientific laboratory technician  . Financial resource strain: Not on file  . Food insecurity:    Worry: Not on file    Inability: Not on file  . Transportation needs:    Medical: Not on file    Non-medical: Not on file  Tobacco Use  . Smoking status: Current Some Day Smoker    Types: Cigarettes  . Smokeless tobacco: Never Used  Substance and Sexual Activity  . Alcohol use: Yes    Alcohol/week: 1.2 oz    Types: 2 Cans of beer per week  . Drug use: No  . Sexual activity: Not on file  Lifestyle  . Physical activity:    Days per week: Not on file    Minutes per session: Not on file  . Stress: Not on file  Relationships  . Social connections:    Talks on phone: Not on file    Gets together: Not on file    Attends religious service: Not on file    Active member of club or organization: Not on file    Attends meetings of clubs or organizations: Not on file    Relationship status: Not on file  . Intimate partner violence:    Fear of current or ex partner: Not on file    Emotionally abused: Not on file    Physically abused: Not on file    Forced sexual activity: Not on file  Other Topics Concern  . Not on file  Social History Narrative   Patient is single, retired   Patient is right handed   Education level is high school   Caffeine consumption is 1 cup daily     Current Outpatient Medications:  .  aspirin EC 81 MG tablet, Take 81 mg by mouth daily., Disp: , Rfl:  .  atorvastatin (LIPITOR) 10 MG tablet, Take 1 tablet (10 mg total) by mouth daily., Disp: 90 tablet, Rfl: 1 .  famotidine (PEPCID) 20 MG tablet, Take 1 tablet (20 mg total) by mouth at bedtime., Disp: 30 tablet, Rfl: 2 .  gabapentin (NEURONTIN) 100 MG capsule, TAKE 2 CAPSULES BY MOUTH AT BEDTIME, Disp: 180 capsule, Rfl: 1 .  losartan-hydrochlorothiazide  (HYZAAR) 100-25 MG tablet, Take 1 tablet by mouth daily., Disp: 90 tablet, Rfl: 3 .  metoprolol succinate (TOPROL-XL) 25 MG 24 hr tablet, take 1 tablet by mouth once daily, Disp: 90 tablet, Rfl: 3 .  nitroGLYCERIN (NITROSTAT) 0.4 MG SL tablet, Place 1 tablet (0.4 mg total) under the tongue every 5 (five) minutes x 3 doses as needed for chest pain., Disp: 25 tablet, Rfl: 0 .  pantoprazole (PROTONIX) 40 MG tablet, Take 1 tablet (40 mg total) by mouth daily., Disp: 90 tablet, Rfl: 1  No Known Allergies   ROS See HPI  Objective  Vitals:   12/24/17 1023  BP: 106/62  Pulse: 86  Resp: 16  Temp: 98.9 F (37.2 C)  TempSrc: Oral  SpO2: 98%  Weight: 187 lb 12.8 oz (85.2 kg)  Height: 5\' 11"  (1.803 m)    Body mass index is 26.19 kg/m.  Physical Exam Vital signs reviewed. Constitutional: Patient appears well-developed and well-nourished. No distress.  HENT: Head: Normocephalic and atraumatic. Nose: Nose normal. Mouth/Throat: Oropharynx is clear and moist. Eyes: Conjunctivae and EOM are normal. Pupils are equal, round, and reactive to light. No scleral icterus.  Neck: Normal range of motion. Neck supple. Cardiovascular: Normal rate, regular rhythm and normal heart sounds. Distal pulses intact. Pulmonary/Chest: Effort normal and breath sounds normal. No respiratory distress. Musculoskeletal: Normal range of motion,  No gross deformities Neurological:He is alert and oriented. No cranial nerve deficit. Coordination, balance, strength, speech and gait are normal.  Skin: Skin is warm and dry. No rash noted. No erythema.  Psychiatric: Patient has a normal mood and affect.His has abnormal memory  Assessment & Plan RTC in 6 months for routine follow up of chronic conditions  Mental confusion Again today noted to be confused about medications, healthcare conditions, appointments He does not recall prior referral to [facility] or neurology, re-ordered these referrals today I have asked him to bring  family to follow ups with him but he has not He tells me today that he is a drinker, which could play a role in his confusion Encouraged to decrease, avoid alcohol intake - AMB Referral to [place] Management - Ambulatory referral to Neurology  Pain in both lower extremities Update labs F/U with further recommendations pending lab results - Comprehensive metabolic panel; Future - CK Total (and CKMB); Future

## 2017-12-24 NOTE — Patient Instructions (Addendum)
Please head downstairs for lab work. If any of your test results are critically abnormal, you will be contacted right away. Otherwise, I will contact you within a week about your test results and any recommendations for abnormalities.  I have placed a referral to [facility] for home health resources and neurology for headaches and memory loss. Our office will begin processing this referral. Please follow up if you have not heard anything about this referral within 10 days.  I will plan to see you back in 6 months for routine followup, sooner if needed

## 2017-12-24 NOTE — Assessment & Plan Note (Addendum)
He tells me today that he is a drinker, unsure how much he is drinking daily, but I suspect this is playing a role in his confusion Encouraged to decrease, avoid alcohol intake

## 2017-12-24 NOTE — Assessment & Plan Note (Signed)
Referral to neurology reordered today for further evaluation - Sed Rate (ESR); Future - Ambulatory referral to Neurology

## 2017-12-25 ENCOUNTER — Other Ambulatory Visit: Payer: Self-pay | Admitting: *Deleted

## 2017-12-25 NOTE — Patient Outreach (Signed)
[place] [facility]) Care Management  12/25/2017  Andre [name] 19-May-1941 [id number]   Referral received from telephonic care manger for further assessment of member's needs and community involvement.  Per chart, he has history of diabetes, hypertension, COPD, and alcohol abuse.  He is familiar to this care manager as he was previously active but case was closed due to inability to maintain contact.  Call placed to member to discuss needs and schedule home visit.  He initially reported that he felt he was managing his health well, denied needing assistance.  This care manager discussed benefits of [facility] program, he reluctantly agrees to home visit next week.  Denies any urgent concerns.  [name], [place], MSN [place] [phone]

## 2017-12-31 ENCOUNTER — Other Ambulatory Visit: Payer: Self-pay | Admitting: *Deleted

## 2017-12-31 NOTE — Patient Outreach (Signed)
[facility]) Care Management  12/31/2017  Andre [name] 1940/12/24 [id number]   Arrived at [facility] home for initial home visit.  [facility] care management services again explained as he was still unsure why MD placed referral, stating "I'm doing alright.  I don't need any help right now."  Office notes from last week reviewed, discussed MD concerns for medication management as well as overall management of chronic conditions.  He state he is taking medications as prescribed, however does not have Metoprolol and Pantoprazole.  Discussed opportunity to have [facility] pharmacist, he declines.    MD concern regarding member's drinking and lack of family support discussed.  He state he does not drink much, only a few days a week, but does agree that he need to cut back on smoking.  However, he state "I can stop at any time.  That's no problem."  Report he has 11 children, state he will ask one of them to begin accompanying him to MD visits.  This care manager again discussed involvement with [facility] services, he declines.  Provided with [facility] calendar with this care manager's contact phone number as well as 24 hour nurse triage line.  Advised to contact if he would like to engage.  Will notify primary MD of case closure.  [name], [place], MSN [place] [phone]

## 2018-01-22 ENCOUNTER — Encounter: Payer: Self-pay | Admitting: Physical Therapy

## 2018-01-22 NOTE — Therapy (Signed)
[place], [place], [address] Phone: [phone]   Fax:  [phone]  Patient Details  Name: Andre [name] MRN: [id number] Date of Birth: 17-Jun-1940 Referring Provider:  No ref. provider found  Encounter Date: 01/22/2018  PHYSICAL THERAPY DISCHARGE SUMMARY  Visits from Start of Care: 10  Current functional level related to goals / functional outcomes: Patient has not returned since last visit; DC today.    Remaining deficits: Unable to assess   Education / Equipment: N/A  Plan: Patient agrees to discharge.  Patient goals were partially met. Patient is being discharged due to not returning since the last visit.  ?????      Deniece Ree PT, DPT, CBIS  Supplemental Physical Therapist [place]   Pager [facility] [address] [place], [place], [address] Phone: [phone]   Fax:  [phone]

## 2018-01-31 IMAGING — CR DG CHEST 2V
2 series · 2 of 2 positions shown · non-contrast
Comparison: Chest radiograph dated 12/10/2016

CLINICAL DATA: 26-year-old male with right-sided chest pain.

EXAM:
CHEST  2 VIEW

[chest pa]
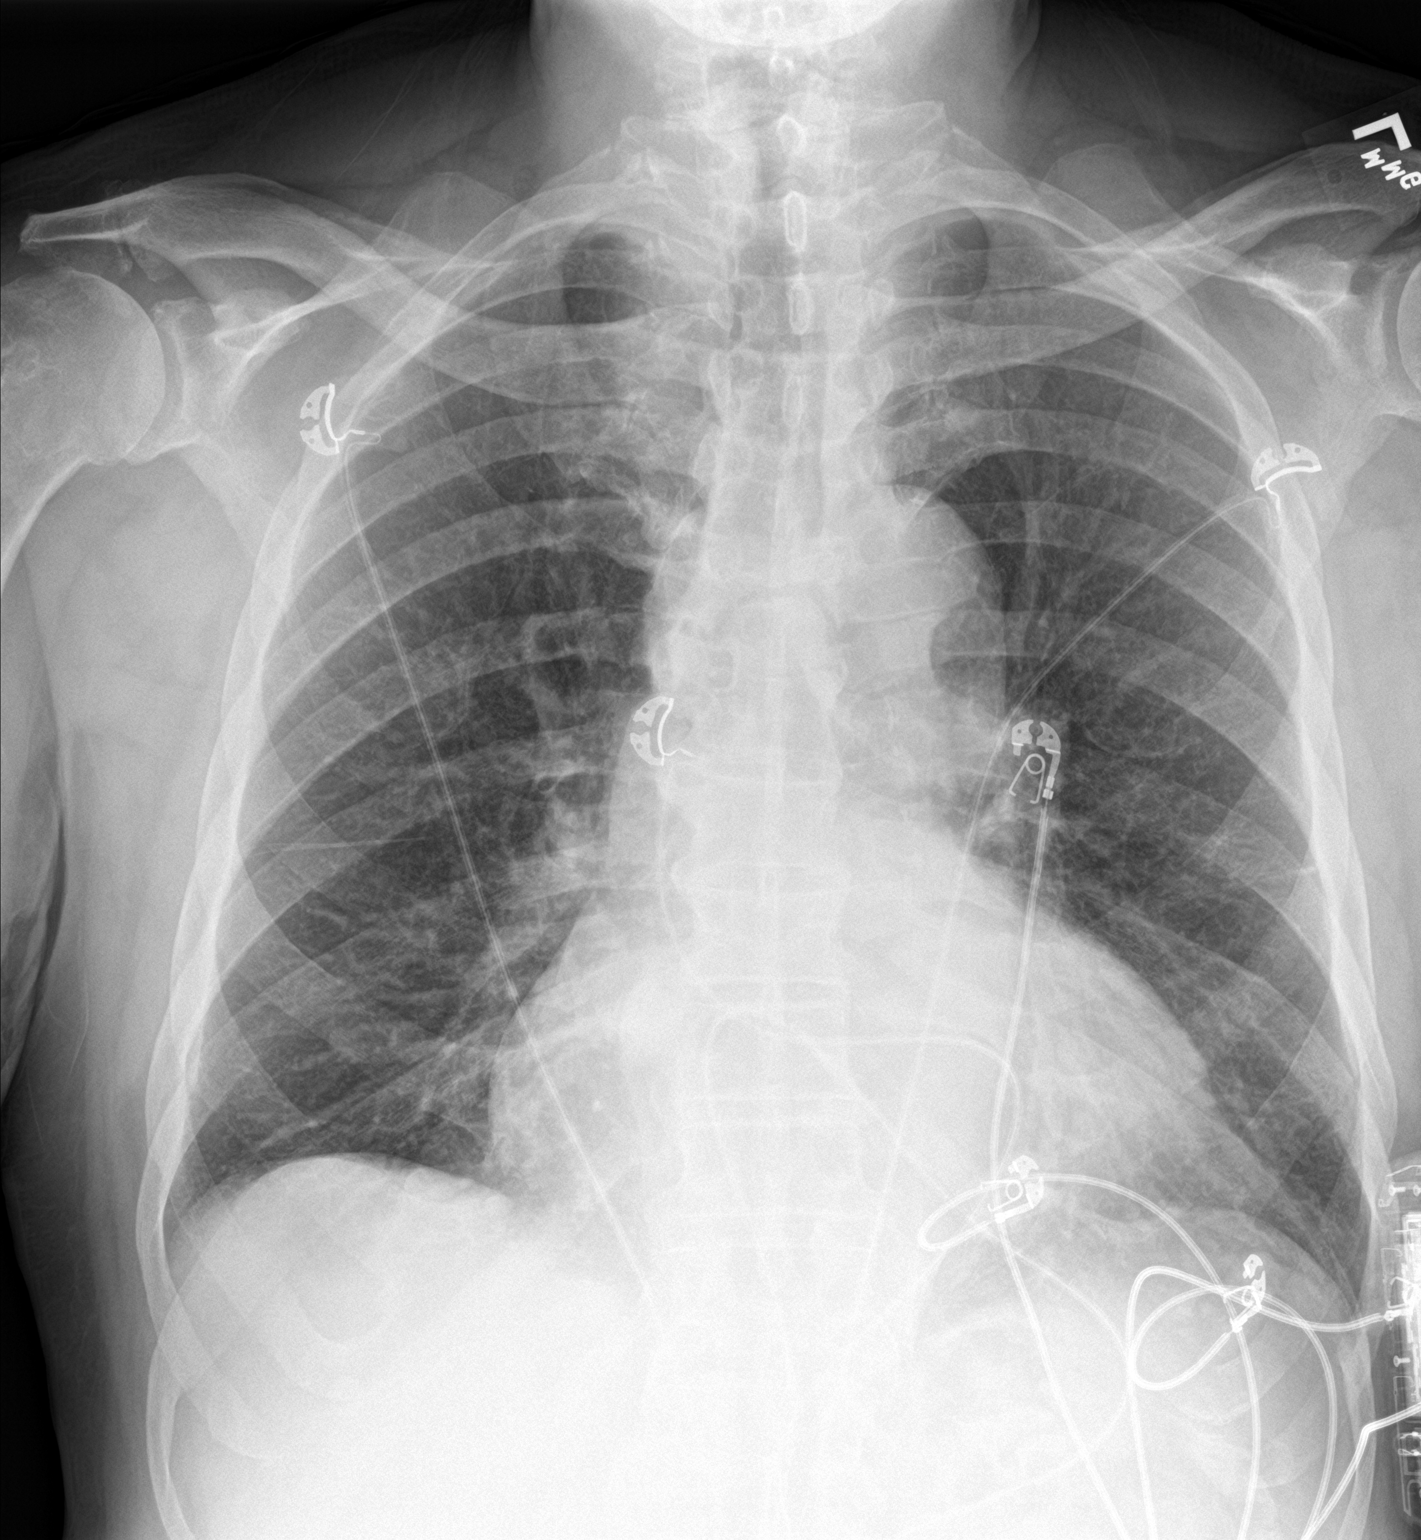

[chest lat]
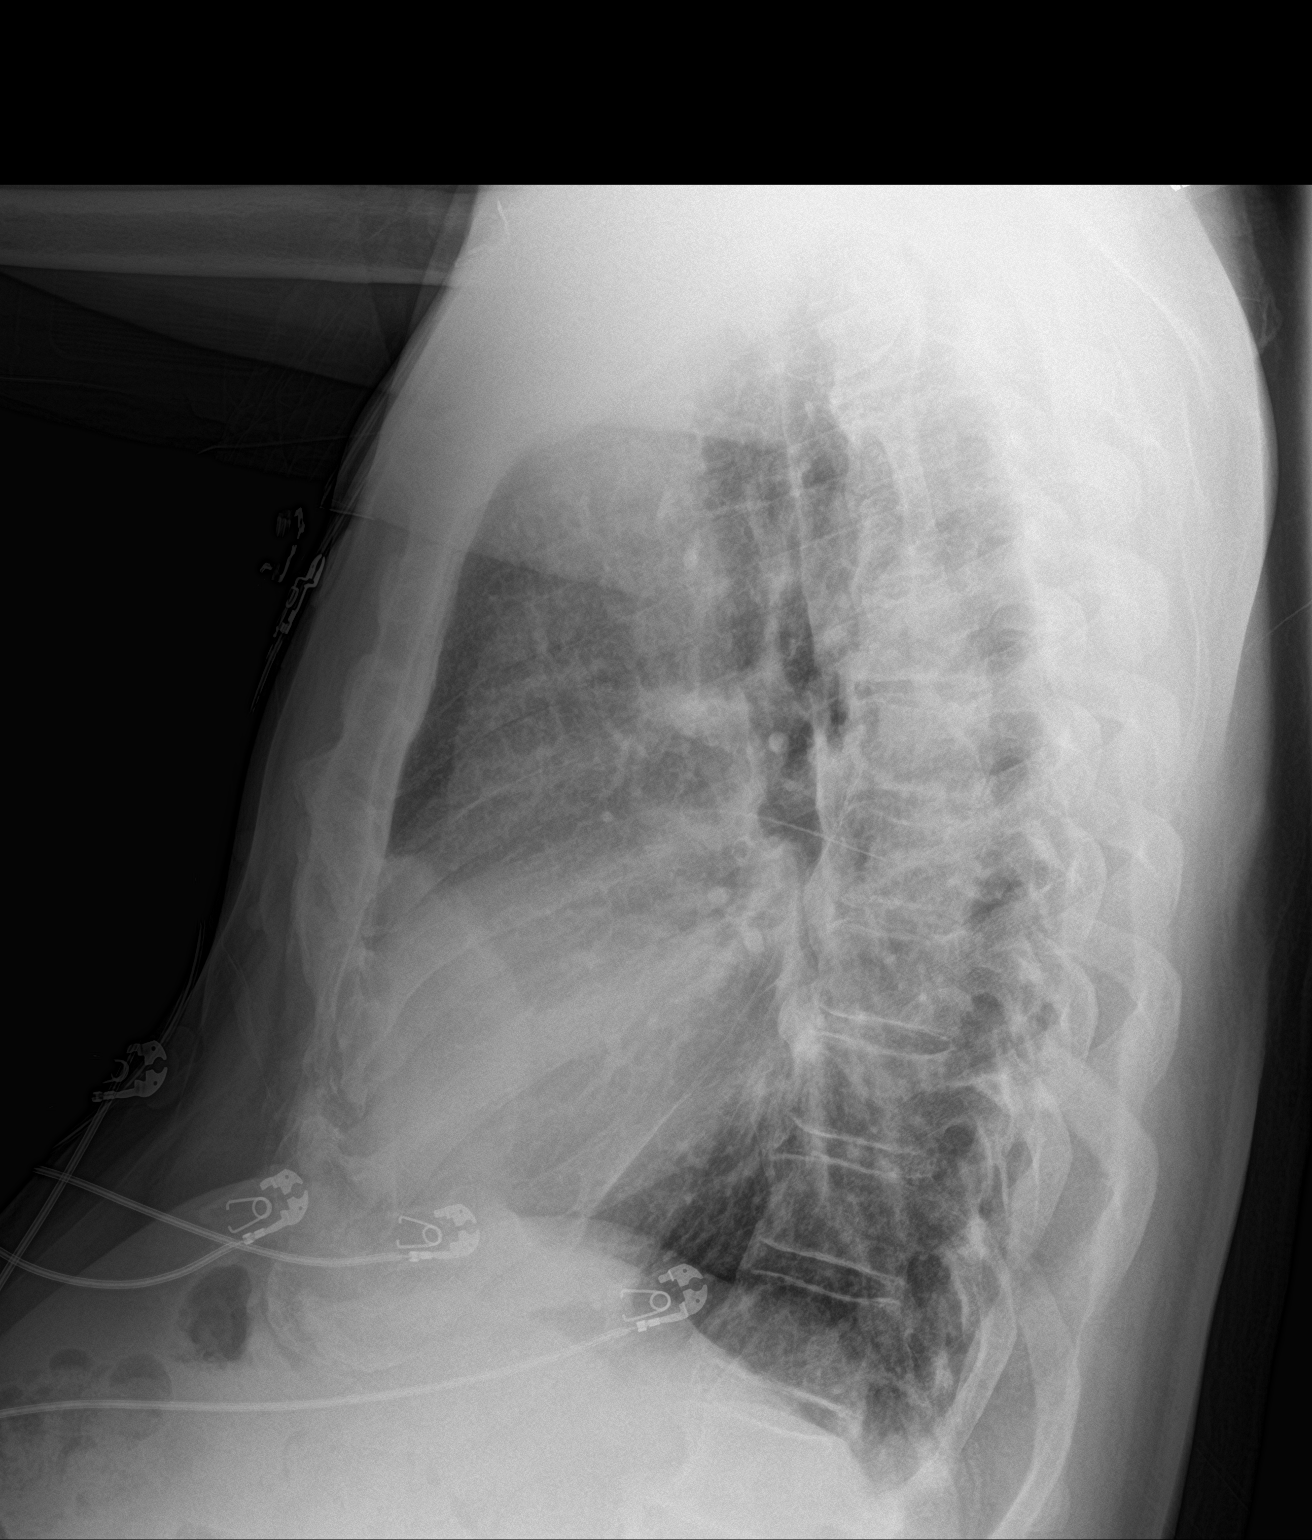

[2 of 2 positions shown; findings below may reference images not displayed]

FINDINGS: Minimal left lung base atelectatic changes/scarring similar to prior
radiograph and CT of 10/20/2015. There is no focal consolidation,
pleural effusion, or pneumothorax. Stable mild cardiomegaly. No
acute osseous pathology.
IMPRESSION: 1. No acute cardiopulmonary process.
2. Stable left lung base atelectasis/scarring.
3. Stable mild cardiomegaly.

## 2018-02-09 ENCOUNTER — Other Ambulatory Visit: Payer: Self-pay | Admitting: Family

## 2018-02-09 DIAGNOSIS — I10 Essential (primary) hypertension: Secondary | ICD-10-CM

## 2018-02-20 NOTE — Progress Notes (Deleted)
NEUROLOGY CONSULTATION NOTE  Andre [name] MRN: [id number] DOB: ***  Referring provider: *** Primary care provider: ***  Reason for consult:  ***  HISTORY OF PRESENT ILLNESS: Andre [name] is a 77 year old ***-handed male with type 2 diabetes mellitus, COPD, CAD, hypertension, tobacco abuse, and history of alcohol abuse and stroke/TIA who presents for headaches and confusion.  History supplemented by referring provider's notes.  Onset:  *** Location:  *** Quality:  *** Intensity:  ***.  *** denies new headache, thunderclap headache or severe headache that wakes *** from sleep. Aura:  *** Prodrome:  *** Postdrome:  *** Associated symptoms:  ***.  *** denies associated unilateral numbness or weakness. Duration:  *** Frequency:  *** Frequency of abortive medication: *** Triggers:  *** Exacerbating factors:  *** Relieving factors:  *** Activity:  ***  Current NSAIDS:  *** Current analgesics:  *** Current triptans:  *** Current ergotamine:  *** Current anti-emetic:  *** Current muscle relaxants:  *** Current anti-anxiolytic:  *** Current sleep aide:  *** Current Antihypertensive medications:  Hyzaar 100-25mg  daily, Toprol-XL 25mg  daily Current Antidepressant medications:  *** Current Anticonvulsant medications:  Gabapentin 200mg  at bedtime Current anti-CGRP:  *** Current Vitamins/Herbal/Supplements:  *** Current Antihistamines/Decongestants:  *** Other therapy:  *** Other medication:  ***  Past NSAIDS:  *** Past analgesics:  Fioricet Past abortive triptans:  *** Past abortive ergotamine:  *** Past muscle relaxants:  *** Past anti-emetic:  *** Past antihypertensive medications:  Atenolol 25mg  daily Past antidepressant medications:  *** Past anticonvulsant medications:  Tegretol 200mg  three times daily Past anti-CGRP:  *** Past vitamins/Herbal/Supplements:  *** Past antihistamines/decongestants:  *** Other past therapies:  ***  Cognitive deficits: His PCP has  noted confusion at office visits.  He reportedly is unsure about medications and his medical history.  He has had trouble remembering appointments.  ***  Most recent Head CT with and without contrast from 05/12/17 was personally reviewed and demonstrated intracranial atherosclerotic disease with chronic small vessel ischemic changes and atrophy with no abnormal enhancement.  He previously had an MRI of the brain without contrast on 08/29/13, which was also personally reviewed and also demonstrated atrophy and chronic small vessel ischemic changes.  08/20/17: HGB A1c 7.3%, LDL 91   PAST MEDICAL HISTORY: Past Medical History:  Diagnosis Date  . Arthritis   . Coronary artery disease, non-occlusive    a. 06/2011: cath for abnormal exercise echo: Only 30-40% proximal RCA. Otherwise normal. b. 10/2015: cath for abnormal NST and atypical CP --> showed mild 30-40% stenosis in the prox-distal RCA  . GERD (gastroesophageal reflux disease)   . HA (headache)   . Hypertension   . 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[place])   . TIA (transient ischemic attack)   . Weakness     PAST SURGICAL HISTORY: Past Surgical History:  Procedure Laterality Date  . CARDIAC CATHETERIZATION  January 2013   Proximal RCA 30-40%. Otherwise normal.  . CARDIAC CATHETERIZATION N/A 10/18/2015   Procedure: Left Heart Cath and Coronary Angiography;  Surgeon: Jettie Booze, MD;  Location: [facility];  Service: Cardiovascular;  Laterality: N/A;  . NM MYOVIEW LTD  March 2015   LOW RISK. No scar or ischemia. Normal EF (55%). No RWMA  . surgical excision of rectal cancer      MEDICATIONS: Current Outpatient Medications on File Prior to Visit  Medication Sig Dispense Refill  . aspirin EC 81 MG tablet Take 81 mg by mouth daily.    Marland Kitchen atorvastatin (LIPITOR) 10  MG tablet Take 1 tablet (10 mg total) by mouth daily. 90 tablet 1  . famotidine (PEPCID) 20 MG tablet Take 1 tablet (20 mg total) by mouth at bedtime. (Patient not taking: Reported on  12/31/2017) 30 tablet 2  . gabapentin (NEURONTIN) 100 MG capsule TAKE 2 CAPSULES BY MOUTH AT BEDTIME 180 capsule 1  . losartan-hydrochlorothiazide (HYZAAR) 100-25 MG tablet Take 1 tablet by mouth daily. 90 tablet 3  . metoprolol succinate (TOPROL-XL) 25 MG 24 hr tablet take 1 tablet by mouth once daily (Patient not taking: Reported on 12/31/2017) 90 tablet 3  . nitroGLYCERIN (NITROSTAT) 0.4 MG SL tablet Place 1 tablet (0.4 mg total) under the tongue every 5 (five) minutes x 3 doses as needed for chest pain. (Patient not taking: Reported on 12/31/2017) 25 tablet 0  . pantoprazole (PROTONIX) 40 MG tablet Take 1 tablet (40 mg total) by mouth daily. (Patient not taking: Reported on 12/31/2017) 90 tablet 1  . [DISCONTINUED] chlorthalidone (HYGROTON) 25 MG tablet Take 25 mg by mouth daily.     No current facility-administered medications on file prior to visit.     ALLERGIES: No Known Allergies  FAMILY HISTORY: Family History  Problem Relation Age of Onset  . Hypertension Mother   . Cancer Mother   . Liver disease Father    SOCIAL HISTORY: Social History   Socioeconomic History  . Marital status: Single    Spouse name: Not on file  . Number of children: 11  . Years of education: 68  . Highest education level: Not on file  Occupational History  . Occupation: Retried    Comment: retired  Scientific laboratory technician  . Financial resource strain: Not on file  . Food insecurity:    Worry: Not on file    Inability: Not on file  . Transportation needs:    Medical: Not on file    Non-medical: Not on file  Tobacco Use  . Smoking status: Current Some Day Smoker    Types: Cigarettes  . Smokeless tobacco: Never Used  Substance and Sexual Activity  . Alcohol use: Yes    Alcohol/week: 2.0 standard drinks    Types: 2 Cans of beer per week  . Drug use: No  . Sexual activity: Not on file  Lifestyle  . Physical activity:    Days per week: Not on file    Minutes per session: Not on file  . Stress: Not on  file  Relationships  . Social connections:    Talks on phone: Not on file    Gets together: Not on file    Attends religious service: Not on file    Active member of club or organization: Not on file    Attends meetings of clubs or organizations: Not on file    Relationship status: Not on file  . Intimate partner violence:    Fear of current or ex partner: Not on file    Emotionally abused: Not on file    Physically abused: Not on file    Forced sexual activity: Not on file  Other Topics Concern  . Not on file  Social History Narrative   Patient is single, retired   Patient is right handed   Education level is high school   Caffeine consumption is 1 cup daily    REVIEW OF SYSTEMS: Constitutional: No fevers, chills, or sweats, no generalized fatigue, change in appetite Eyes: No visual changes, double vision, eye pain Ear, nose and throat: No hearing loss, ear pain,  nasal congestion, sore throat Cardiovascular: No chest pain, palpitations Respiratory:  No shortness of breath at rest or with exertion, wheezes GastrointestinaI: No nausea, vomiting, diarrhea, abdominal pain, fecal incontinence Genitourinary:  No dysuria, urinary retention or frequency Musculoskeletal:  No neck pain, back pain Integumentary: No rash, pruritus, skin lesions Neurological: as above Psychiatric: No depression, insomnia, anxiety Endocrine: No palpitations, fatigue, diaphoresis, mood swings, change in appetite, change in weight, increased thirst Hematologic/Lymphatic:  No purpura, petechiae. Allergic/Immunologic: no itchy/runny eyes, nasal congestion, recent allergic reactions, rashes  PHYSICAL EXAM: *** General: No acute distress.  Patient appears ***-groomed.  *** Head:  Normocephalic/atraumatic Eyes:  fundi examined but not visualized Neck: supple, no paraspinal tenderness, full range of motion Back: No paraspinal tenderness Heart: regular rate and rhythm Lungs: Clear to auscultation  bilaterally. Vascular: No carotid bruits. Neurological Exam: Mental status: alert and oriented to person, place, and time, recent and remote memory intact, fund of knowledge intact, attention and concentration intact, speech fluent and not dysarthric, language intact. Cranial nerves: CN I: not tested CN II: pupils equal, round and reactive to light, visual fields intact CN III, IV, VI:  full range of motion, no nystagmus, no ptosis CN V: facial sensation intact CN VII: upper and lower face symmetric CN VIII: hearing intact CN IX, X: gag intact, uvula midline CN XI: sternocleidomastoid and trapezius muscles intact CN XII: tongue midline Bulk & Tone: normal, no fasciculations. Motor:  5/5 throughout *** Sensation:  Pinprick *** temperature *** and vibration sensation intact.  ***. Deep Tendon Reflexes:  2+ throughout, *** toes downgoing.  *** Finger to nose testing:  Without dysmetria.  *** Heel to shin:  Without dysmetria.  *** Gait:  Normal station and stride.  Able to turn and tandem walk. [place] ***.  IMPRESSION: ***  PLAN: ***  Thank you for allowing me to take part in the care of this patient.  Metta Clines, DO  CC: ***

## 2018-02-22 ENCOUNTER — Ambulatory Visit: Payer: Medicare Other | Admitting: Neurology

## 2018-03-20 ENCOUNTER — Other Ambulatory Visit: Payer: Self-pay

## 2018-03-20 NOTE — Patient Outreach (Signed)
[facility]) Care Management  03/20/2018  Andre [name] February 21, 1941 [id number]   Medication Adherence call to Andre. Andre [name] patient did not answer patient is due on Atorvastatin 10 mg [facility] said patient last pick up in May/2019. Andre [name] is showing past due under [place].   [place] Management Direct Dial [phone]  Fax [phone] [email or web address]

## 2018-05-02 ENCOUNTER — Other Ambulatory Visit: Payer: Self-pay | Admitting: Internal Medicine

## 2018-06-21 ENCOUNTER — Other Ambulatory Visit: Payer: Self-pay

## 2018-06-21 ENCOUNTER — Emergency Department (HOSPITAL_COMMUNITY)
Admission: EM | Admit: 2018-06-21 | Discharge: 2018-06-21 | Disposition: A | Discharge status: Home / Self Care | Payer: Medicare Other | Attending: Emergency Medicine | Admitting: Emergency Medicine

## 2018-06-21 ENCOUNTER — Emergency Department (HOSPITAL_COMMUNITY): Payer: Medicare Other

## 2018-06-21 ENCOUNTER — Encounter (HOSPITAL_COMMUNITY): Payer: Self-pay | Admitting: *Deleted

## 2018-06-21 DIAGNOSIS — R079 Chest pain, unspecified: Secondary | ICD-10-CM | POA: Diagnosis not present

## 2018-06-21 DIAGNOSIS — I4581 Long QT syndrome: Secondary | ICD-10-CM | POA: Diagnosis not present

## 2018-06-21 DIAGNOSIS — Z8673 Personal history of transient ischemic attack (TIA), and cerebral infarction without residual deficits: Secondary | ICD-10-CM | POA: Diagnosis not present

## 2018-06-21 DIAGNOSIS — I10 Essential (primary) hypertension: Secondary | ICD-10-CM | POA: Diagnosis not present

## 2018-06-21 DIAGNOSIS — J40 Bronchitis, not specified as acute or chronic: Secondary | ICD-10-CM | POA: Diagnosis not present

## 2018-06-21 DIAGNOSIS — Z7982 Long term (current) use of aspirin: Secondary | ICD-10-CM | POA: Insufficient documentation

## 2018-06-21 DIAGNOSIS — I2510 Atherosclerotic heart disease of native coronary artery without angina pectoris: Secondary | ICD-10-CM | POA: Insufficient documentation

## 2018-06-21 DIAGNOSIS — F17210 Nicotine dependence, cigarettes, uncomplicated: Secondary | ICD-10-CM | POA: Insufficient documentation

## 2018-06-21 DIAGNOSIS — Z85048 Personal history of other malignant neoplasm of rectum, rectosigmoid junction, and anus: Secondary | ICD-10-CM | POA: Diagnosis not present

## 2018-06-21 DIAGNOSIS — E876 Hypokalemia: Secondary | ICD-10-CM | POA: Insufficient documentation

## 2018-06-21 DIAGNOSIS — Z79899 Other long term (current) drug therapy: Secondary | ICD-10-CM | POA: Insufficient documentation

## 2018-06-21 DIAGNOSIS — E119 Type 2 diabetes mellitus without complications: Secondary | ICD-10-CM | POA: Insufficient documentation

## 2018-06-21 DIAGNOSIS — J449 Chronic obstructive pulmonary disease, unspecified: Secondary | ICD-10-CM | POA: Diagnosis not present

## 2018-06-21 DIAGNOSIS — R05 Cough: Secondary | ICD-10-CM | POA: Diagnosis not present

## 2018-06-21 LAB — CBC WITH DIFFERENTIAL/PLATELET
Abs Immature Granulocytes: 0.07 10*3/uL (ref 0.00–0.07)
Basophils Absolute: 0 10*3/uL (ref 0.0–0.1)
Basophils Relative: 0 %
Eosinophils Absolute: 0.3 10*3/uL (ref 0.0–0.5)
Eosinophils Relative: 3 %
HCT: 44 % (ref 39.0–52.0)
Hemoglobin: 14.5 g/dL (ref 13.0–17.0)
Immature Granulocytes: 1 %
LYMPHS PCT: 27 %
Lymphs Abs: 3 10*3/uL (ref 0.7–4.0)
MCH: 33.2 pg (ref 26.0–34.0)
MCHC: 33 g/dL (ref 30.0–36.0)
MCV: 100.7 fL — ABNORMAL HIGH (ref 80.0–100.0)
Monocytes Absolute: 1.2 10*3/uL — ABNORMAL HIGH (ref 0.1–1.0)
Monocytes Relative: 10 %
Neutro Abs: 6.7 10*3/uL (ref 1.7–7.7)
Neutrophils Relative %: 59 %
Platelets: 210 10*3/uL (ref 150–400)
RBC: 4.37 MIL/uL (ref 4.22–5.81)
RDW: 13.9 % (ref 11.5–15.5)
WBC: 11.2 10*3/uL — AB (ref 4.0–10.5)
nRBC: 0 % (ref 0.0–0.2)

## 2018-06-21 LAB — BASIC METABOLIC PANEL
Anion gap: 13 (ref 5–15)
BUN: 9 mg/dL (ref 8–23)
CHLORIDE: 98 mmol/L (ref 98–111)
CO2: 26 mmol/L (ref 22–32)
Calcium: 8.8 mg/dL — ABNORMAL LOW (ref 8.9–10.3)
Creatinine, Ser: 0.97 mg/dL (ref 0.61–1.24)
GFR calc Af Amer: >60 mL/min (ref >60)
GFR calc non Af Amer: >60 mL/min (ref >60)
Glucose, Bld: 125 mg/dL — ABNORMAL HIGH (ref 70–99)
Potassium: 2.7 mmol/L — CL (ref 3.5–5.1)
Sodium: 137 mmol/L (ref 135–145)

## 2018-06-21 LAB — TROPONIN I

## 2018-06-21 MED ORDER — AMOXICILLIN 500 MG PO CAPS: 500.0000 mg | ORAL_CAPSULE | Freq: Three times a day (TID) | ORAL | 0 refills | Status: DC

## 2018-06-21 MED ORDER — POTASSIUM CHLORIDE CRYS ER 20 MEQ PO TBCR: 40.0000 meq | EXTENDED_RELEASE_TABLET | Freq: Two times a day (BID) | ORAL | 0 refills | Status: DC

## 2018-06-21 MED ORDER — POTASSIUM CHLORIDE CRYS ER 20 MEQ PO TBCR
40.0000 meq | EXTENDED_RELEASE_TABLET | Freq: Once | ORAL | Status: AC
  Administered 2018-06-21: 40 meq via ORAL
  Filled 2018-06-21: qty 2

## 2018-06-21 NOTE — Discharge Instructions (Addendum)
Return if any problems.

## 2018-06-21 NOTE — ED Provider Notes (Signed)
[facility] DEPT Provider Note   CSN: [id number] Arrival date & time: 06/21/18  0559     History   Chief Complaint Chief Complaint  Patient presents with  . Cough    productive, yellow    HPI Andre [name] is a 78 y.o. male.  The history is provided by the patient. No language interpreter was used.  Cough  Cough characteristics:  Productive Sputum characteristics:  Nondescript Severity:  Moderate Onset quality:  Gradual Timing:  Constant Progression:  Worsening Chronicity:  New Smoker: yes   Context: smoke exposure   Relieved by:  Nothing Worsened by:  Nothing Ineffective treatments:  None tried Associated symptoms: shortness of breath     Past Medical History:  Diagnosis Date  . Arthritis   . Coronary artery disease, non-occlusive    a. 06/2011: cath for abnormal exercise echo: Only 30-40% proximal RCA. Otherwise normal. b. 10/2015: cath for abnormal NST and atypical CP --> showed mild 30-40% stenosis in the prox-distal RCA  . GERD (gastroesophageal reflux disease)   . HA (headache)   . Hypertension   . 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[place])   . TIA (transient ischemic attack)   . Weakness     Patient Active Problem List   Diagnosis Date Noted  . Hyperlipidemia 12/24/2017  . Alcohol use 12/24/2017  . Non-compliance 12/24/2017  . 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 Left lumbar radiculopathy 08/02/2017  . Atherosclerosis 06/03/2017  . Low-level of literacy 05/12/2017  . Osteoarthritis 04/20/2017  . Diabetes ([place]) 03/12/2017  . Acute non-recurrent frontal sinusitis 01/04/2017  . COPD exacerbation ([place]) 12/14/2016  . Acute upper respiratory infection 06/20/2016  . Back pain 02/29/2016  . Abnormal stress test 10/18/2015  . Erectile dysfunction 02/25/2015  . Cigarette smoker 02/10/2015  . COPD GOLD 0 11/08/2014  . Hemorrhoids 02/11/2014  . 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 Headache 10/23/2011  . 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[place]) 10/23/2011    . GERD (gastroesophageal reflux disease) 10/23/2011  . Nicotine addiction 10/23/2011  . 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 Cervical stenosis of spinal canal 10/23/2011  . Essential hypertension, benign 07/10/2011    Past Surgical History:  Procedure Laterality Date  . CARDIAC CATHETERIZATION  January 2013   Proximal RCA 30-40%. Otherwise normal.  . CARDIAC CATHETERIZATION N/A 10/18/2015   Procedure: Left Heart Cath and Coronary Angiography;  Surgeon: Jettie Booze, MD;  Location: [facility];  Service: Cardiovascular;  Laterality: N/A;  . NM MYOVIEW LTD  March 2015   LOW RISK. No scar or ischemia. Normal EF (55%). No RWMA  . surgical excision of rectal cancer          Home Medications    Prior to Admission medications   Medication Sig Start Date End Date Taking? Authorizing Provider  aspirin EC 81 MG tablet Take 81 mg by mouth daily.    [provider]  atorvastatin (LIPITOR) 10 MG tablet Take 1 tablet (10 mg total) by mouth daily. 10/25/17   Lance Sell, NP  famotidine (PEPCID) 20 MG tablet Take 1 tablet (20 mg total) by mouth at bedtime. Patient not taking: Reported on 12/31/2017 05/10/17   Lance Sell, NP  gabapentin (NEURONTIN) 100 MG capsule TAKE 2 CAPSULES BY MOUTH AT BEDTIME 12/11/17   Lyndal Pulley, DO  losartan-hydrochlorothiazide (HYZAAR) 100-25 MG tablet TAKE 1 TABLET BY MOUTH ONCE DAILY 05/02/18   Lance Sell, NP  metoprolol succinate (TOPROL-XL) 25 MG 24 hr tablet take 1  tablet by mouth once daily Patient not taking: Reported on 12/31/2017 02/06/17   Golden Circle, FNP  nitroGLYCERIN (NITROSTAT) 0.4 MG SL tablet Place 1 tablet (0.4 mg total) under the tongue every 5 (five) minutes x 3 doses as needed for chest pain. Patient not taking: Reported on 12/31/2017 01/26/17   Leonie Man, MD  pantoprazole (PROTONIX) 40 MG tablet Take 1 tablet (40 mg total) by mouth daily. Patient not taking: Reported on 12/31/2017  03/12/17   Binnie Rail, MD  chlorthalidone (HYGROTON) 25 MG tablet Take 25 mg by mouth daily.  08/30/11  [provider]    Family History Family History  Problem Relation Age of Onset  . Hypertension Mother   . Cancer Mother   . Liver disease Father     Social History Social History   Tobacco Use  . Smoking status: Current Some Day Smoker    Types: Cigarettes  . Smokeless tobacco: Never Used  Substance Use Topics  . Alcohol use: Yes    Alcohol/week: 2.0 standard drinks    Types: 2 Cans of beer per week  . Drug use: No     Allergies   Patient has no known allergies.   Review of Systems Review of Systems  Respiratory: Positive for cough and shortness of breath.   All other systems reviewed and are negative.    Physical Exam Updated Vital Signs BP 121/69   Pulse 88   Temp 98.3 F (36.8 C) (Oral)   Resp (!) 25   Ht 5\' 11"  (1.803 m)   Wt 83.9 kg   SpO2 97%   BMI 25.80 kg/m   Physical Exam Vitals signs and nursing note reviewed.  Constitutional:      Appearance: He is well-developed.  HENT:     Head: Normocephalic and atraumatic.     Right Ear: Tympanic membrane normal.     Left Ear: Tympanic membrane normal.     Nose: Nose normal.     Mouth/Throat:     Mouth: Mucous membranes are moist.  Eyes:     Conjunctiva/sclera: Conjunctivae normal.  Neck:     Musculoskeletal: Neck supple.  Cardiovascular:     Rate and Rhythm: Normal rate and regular rhythm.     Heart sounds: No murmur.  Pulmonary:     Effort: Pulmonary effort is normal. No respiratory distress.     Breath sounds: Normal breath sounds.  Abdominal:     Palpations: Abdomen is soft.     Tenderness: There is no abdominal tenderness.  Musculoskeletal: Normal range of motion.  Skin:    General: Skin is warm and dry.  Neurological:     General: No focal deficit present.     Mental Status: He is alert.  Psychiatric:        Mood and Affect: Mood normal.      ED Treatments /  Results  Labs (all labs ordered are listed, but only abnormal results are displayed) Labs Reviewed  CBC WITH DIFFERENTIAL/PLATELET - Abnormal; Notable for the following components:      Result Value   WBC 11.2 (*)    MCV 100.7 (*)    Monocytes Absolute 1.2 (*)    All other components within normal limits  BASIC METABOLIC PANEL  TROPONIN I    EKG EKG Interpretation  Date/Time:  Friday June 21 2018 06:25:03 EST Ventricular Rate:  104 PR Interval:    QRS Duration: 95 QT Interval:  371 QTC Calculation: 488 R Axis:   -  6 Text Interpretation:  Sinus rhythm Multiple premature complexes, vent & supraven Borderline repolarization abnormality Borderline prolonged QT interval Confirmed by Shanon Rosser [id number]) on 06/21/2018 6:29:39 AM   Radiology No results found.  Procedures Procedures (including critical care time)  Medications Ordered in ED Medications - No data to display   Initial Impression / Assessment and Plan / ED Course  I have reviewed the triage vital signs and the nursing notes.  Pertinent labs & imaging results that were available during my care of the patient were reviewed by me and considered in my medical decision making (see chart for details).     MDM Chest xray no pneumonia.  Pt has k of 2.7.  Pt given kdur here and rx.  Pt advised to see his MD for recheck in 1 week   Final Clinical Impressions(s) / ED Diagnoses   Final diagnoses:  Bronchitis  Hypokalemia    ED Discharge Orders         Ordered    amoxicillin (AMOXIL) 500 MG capsule  3 times daily     06/21/18 0822    potassium chloride SA (K-DUR,KLOR-CON) 20 MEQ tablet  2 times daily     06/21/18 6389        An After Visit Summary was printed and given to the patient.    Fransico Meadow, [place] 06/21/18 3734    Shanon Rosser, MD 06/23/18 1332

## 2018-06-21 NOTE — ED Notes (Signed)
RX X 2 PICK UP

## 2018-06-21 NOTE — ED Triage Notes (Signed)
[name] cough x 1 week, productive & is yellow.  Pt also c/o intermittent chest pain.

## 2018-06-26 ENCOUNTER — Ambulatory Visit (INDEPENDENT_AMBULATORY_CARE_PROVIDER_SITE_OTHER): Payer: Medicare Other | Admitting: Nurse Practitioner

## 2018-06-26 ENCOUNTER — Encounter: Payer: Self-pay | Admitting: Nurse Practitioner

## 2018-06-26 ENCOUNTER — Other Ambulatory Visit (INDEPENDENT_AMBULATORY_CARE_PROVIDER_SITE_OTHER): Payer: Medicare Other

## 2018-06-26 VITALS — BP 140/80 | HR 72 | Temp 98.1°F | Ht 71.0 in | Wt 191.0 lb

## 2018-06-26 DIAGNOSIS — E119 Type 2 diabetes mellitus without complications: Secondary | ICD-10-CM | POA: Diagnosis not present

## 2018-06-26 DIAGNOSIS — Z9119 Patient's noncompliance with other medical treatment and regimen: Secondary | ICD-10-CM

## 2018-06-26 DIAGNOSIS — E876 Hypokalemia: Secondary | ICD-10-CM

## 2018-06-26 DIAGNOSIS — Z91199 Patient's noncompliance with other medical treatment and regimen due to unspecified reason: Secondary | ICD-10-CM

## 2018-06-26 DIAGNOSIS — E785 Hyperlipidemia, unspecified: Secondary | ICD-10-CM

## 2018-06-26 DIAGNOSIS — I10 Essential (primary) hypertension: Secondary | ICD-10-CM

## 2018-06-26 LAB — LIPID PANEL
Cholesterol: 158 mg/dL (ref 0–200)
HDL: 33.8 mg/dL — ABNORMAL LOW (ref >39.00)
LDL Cholesterol: 99 mg/dL (ref 0–99)
NonHDL: 123.93
TRIGLYCERIDES: 124 mg/dL (ref 0.0–149.0)
Total CHOL/HDL Ratio: 5
VLDL: 24.8 mg/dL (ref 0.0–40.0)

## 2018-06-26 LAB — CBC
HCT: 44.3 % (ref 39.0–52.0)
HEMOGLOBIN: 15.1 g/dL (ref 13.0–17.0)
MCHC: 34.1 g/dL (ref 30.0–36.0)
MCV: 98.6 fl (ref 78.0–100.0)
Platelets: 339 10*3/uL (ref 150.0–400.0)
RBC: 4.49 Mil/uL (ref 4.22–5.81)
RDW: 14.3 % (ref 11.5–15.5)
WBC: 6.5 10*3/uL (ref 4.0–10.5)

## 2018-06-26 LAB — COMPREHENSIVE METABOLIC PANEL
ALBUMIN: 4 g/dL (ref 3.5–5.2)
ALT: 22 U/L (ref 0–53)
AST: 41 U/L — ABNORMAL HIGH (ref 0–37)
Alkaline Phosphatase: 59 U/L (ref 39–117)
BUN: 13 mg/dL (ref 6–23)
CO2: 33 mEq/L — ABNORMAL HIGH (ref 19–32)
Calcium: 10.4 mg/dL (ref 8.4–10.5)
Chloride: 97 mEq/L (ref 96–112)
Creatinine, Ser: 1.13 mg/dL (ref 0.40–1.50)
GFR: 81.03 mL/min (ref >60.00)
Glucose, Bld: 116 mg/dL — ABNORMAL HIGH (ref 70–99)
Potassium: 3.1 mEq/L — ABNORMAL LOW (ref 3.5–5.1)
Sodium: 139 mEq/L (ref 135–145)
Total Bilirubin: 0.8 mg/dL (ref 0.2–1.2)
Total Protein: 7.7 g/dL (ref 6.0–8.3)

## 2018-06-26 LAB — HEMOGLOBIN A1C: Hgb A1c MFr Bld: 7 % — ABNORMAL HIGH (ref 4.6–6.5)

## 2018-06-26 NOTE — Progress Notes (Signed)
Andre [name] is a 78 y.o. male with the following history as recorded in EpicCare:  Patient Active Problem List   Diagnosis Date Noted  . Hyperlipidemia 12/24/2017  . Alcohol use 12/24/2017  . Non-compliance 12/24/2017  . 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 Left lumbar radiculopathy 08/02/2017  . Atherosclerosis 06/03/2017  . Low-level of literacy 05/12/2017  . Osteoarthritis 04/20/2017  . Diabetes ([place]) 03/12/2017  . Acute non-recurrent frontal sinusitis 01/04/2017  . COPD exacerbation ([place]) 12/14/2016  . Acute upper respiratory infection 06/20/2016  . Back pain 02/29/2016  . Abnormal stress test 10/18/2015  . Erectile dysfunction 02/25/2015  . Cigarette smoker 02/10/2015  . COPD GOLD 0 11/08/2014  . Hemorrhoids 02/11/2014  . 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 Headache 10/23/2011  . 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[place]) 10/23/2011  . GERD (gastroesophageal reflux disease) 10/23/2011  . Nicotine addiction 10/23/2011  . 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 Cervical stenosis of spinal canal 10/23/2011  . Essential hypertension, benign 07/10/2011    Current Outpatient Medications  Medication Sig Dispense Refill  . acetaminophen (TYLENOL) 500 MG tablet Take 500 mg by mouth every 6 (six) hours as needed.    Marland Kitchen amoxicillin (AMOXIL) 500 MG capsule Take 1 capsule (500 mg total) by mouth 3 (three) times daily. 30 capsule 0  . aspirin EC 81 MG tablet Take 81 mg by mouth daily.    Marland Kitchen gabapentin (NEURONTIN) 100 MG capsule TAKE 2 CAPSULES BY MOUTH AT BEDTIME (Patient taking differently: Take 100 mg by mouth at bedtime. ) 180 capsule 1  . losartan-hydrochlorothiazide (HYZAAR) 100-25 MG tablet TAKE 1 TABLET BY MOUTH ONCE DAILY (Patient taking differently: Take 1 tablet by mouth daily. ) 90 tablet 0  . naproxen sodium (ALEVE) 220 MG tablet Take 220 mg by mouth as needed (headache).    . potassium chloride SA (K-DUR,KLOR-CON) 20 MEQ tablet Take 2 tablets (40 mEq total) by mouth 2 (two) times  daily. (Patient not taking: Reported on 06/26/2018) 30 tablet 0   No current facility-administered medications for this visit.     Allergies: Patient has no known allergies.  Past Medical History:  Diagnosis Date  . Arthritis   . Coronary artery disease, non-occlusive    a. 06/2011: cath for abnormal exercise echo: Only 30-40% proximal RCA. Otherwise normal. b. 10/2015: cath for abnormal NST and atypical CP --> showed mild 30-40% stenosis in the prox-distal RCA  . GERD (gastroesophageal reflux disease)   . HA (headache)   . Hypertension   . 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[place])   . TIA (transient ischemic attack)   . Weakness     Past Surgical History:  Procedure Laterality Date  . CARDIAC CATHETERIZATION  January 2013   Proximal RCA 30-40%. Otherwise normal.  . CARDIAC CATHETERIZATION N/A 10/18/2015   Procedure: Left Heart Cath and Coronary Angiography;  Surgeon: Jettie Booze, MD;  Location: [facility];  Service: Cardiovascular;  Laterality: N/A;  . NM MYOVIEW LTD  March 2015   LOW RISK. No scar or ischemia. Normal EF (55%). No RWMA  . surgical excision of rectal cancer      Family History  Problem Relation Age of Onset  . Hypertension Mother   . Cancer Mother   . Liver disease Father     Social History   Tobacco Use  . Smoking status: Current Some Day Smoker    Types: Cigarettes  . Smokeless tobacco: Never Used  Substance Use Topics  . Alcohol use: Yes  Alcohol/week: 2.0 standard drinks    Types: 2 Cans of beer per week     Subjective:  [name] is here today for routine follow up of chronic conditions. At his last OV on 12/24/17, referral was placed to neurology for mental confusion and headaches, he tells me today that he never scheduled neuro appt, doesn't feel he needs to, headaches have improved. I also referred him to [facility] to help with appointments and meds, he tells me today he declined the service and stopped answering their phone calls because he did not find the  service helpful. On chart review, noted that He was seen in in ED on 06/21/17 for bronchitis, noted to have low potassium and says he is currently taking the potassium pills he was prescribed.  Hypertension -maintained on losartan- hctz 100-25 Reports daily medication compliance without adverse medication effects. Reports he does not check his blood pressure readings at home. No headaches, vision changes, chest pain, shortness of breath, edema.  BP Readings from Last 3 Encounters:  06/26/18 140/80  06/21/18 110/66  12/24/17 106/62   HLD- has stopped statin at this time, he says he just did not want to take it any longer but does have "bottle full of the medication at home"  Lab Results  Component Value Date   CHOL 169 08/20/2017   HDL 39.50 08/20/2017   LDLCALC 91 08/20/2017   TRIG 194.0 (H) 08/20/2017   CHOLHDL 4 08/20/2017   Diabetes- maintained on no medications, again today tells me hes never been on medications for diabetes nor told he has diabetes. No polyuria, polydipsia, polyphagia.  Lab Results  Component Value Date   HGBA1C 7.3 (H) 08/20/2017    ROS- See HPI  Objective:  Vitals:   06/26/18 0923 06/26/18 1011  BP: (!) 150/80 140/80  Pulse: 72   Temp: 98.1 F (36.7 C)   TempSrc: Oral   SpO2: 98%   Weight: 191 lb (86.6 kg)   Height: 5\' 11"  (1.803 m)     General: Well developed, well nourished, in no acute distress  Skin : Warm and dry.  Head: Normocephalic and atraumatic  Eyes: Sclera and conjunctiva clear; pupils round and reactive to light; extraocular movements intact  Oropharynx: Pink, supple. No suspicious lesions  Neck: Supple Lungs: Respirations unlabored; clear to auscultation bilaterally without wheeze, rales, rhonchi  CVS exam: normal rate and regular rhythm, S1 and S2 normal.  Extremities: No edema, cyanosis, clubbing  Vessels: Symmetric bilaterally  Neurologic: Alert and oriented; speech intact; face symmetrical; moves all extremities well;  CNII-XII intact without focal deficit  Psychiatric: Normal mood and affect.  Assessment:  1. Essential hypertension, benign   2. Type 2 diabetes mellitus without complication, without long-term current use of insulin ([place])   3. Hyperlipidemia, unspecified hyperlipidemia type   4. Non-compliance   5. Hypokalemia     Plan:   Non-compliance Again today, he is not taking medications, has not followed up with specialists as recommended, has cancelled [facility] services I am trying to help him but not sure what else to offer at this point Will update annual labs and make recommendations as needed but it is unlikely he will follow recommendations  Hypokalemia Update labs F/U with further recommendations pending lab results - Comprehensive metabolic panel; Future   Return in about 6 months (around 12/25/2018) for routine follow up.  Orders Placed This Encounter  Procedures  . CBC    Standing Status:   Future    Number of Occurrences:  1    Standing Expiration Date:   06/27/2019  . Comprehensive metabolic panel    Standing Status:   Future    Number of Occurrences:   1    Standing Expiration Date:   06/27/2019  . Hemoglobin A1c    Standing Status:   Future    Number of Occurrences:   1    Standing Expiration Date:   06/27/2019  . Lipid panel    Standing Status:   Future    Number of Occurrences:   1    Standing Expiration Date:   06/27/2019    Requested Prescriptions    No prescriptions requested or ordered in this encounter

## 2018-06-26 NOTE — Patient Instructions (Addendum)
Head downstairs for labs today.  I will see you back in 6 months, sooner if needed.   DASH Eating Plan DASH stands for "Dietary Approaches to Stop Hypertension." The DASH eating plan is a healthy eating plan that has been shown to reduce high blood pressure (hypertension). It may also reduce your risk for type 2 diabetes, heart disease, and stroke. The DASH eating plan may also help with weight loss. What are tips for following this plan?  General guidelines  Avoid eating more than 2,300 mg (milligrams) of salt (sodium) a day. If you have hypertension, you may need to reduce your sodium intake to 1,500 mg a day.  Limit alcohol intake to no more than 1 drink a day for nonpregnant women and 2 drinks a day for men. One drink equals 12 oz of beer, 5 oz of wine, or 1 oz of hard liquor.  Work with your health care provider to maintain a healthy body weight or to lose weight. Ask what an ideal weight is for you.  Get at least 30 minutes of exercise that causes your heart to beat faster (aerobic exercise) most days of the week. Activities may include walking, swimming, or biking.  Work with your health care provider or diet and nutrition specialist (dietitian) to adjust your eating plan to your individual calorie needs. Reading food labels   Check food labels for the amount of sodium per serving. Choose foods with less than 5 percent of the Daily Value of sodium. Generally, foods with less than 300 mg of sodium per serving fit into this eating plan.  To find whole grains, look for the word "whole" as the first word in the ingredient list. Shopping  Buy products labeled as "low-sodium" or "no salt added."  Buy fresh foods. Avoid canned foods and premade or frozen meals. Cooking  Avoid adding salt when cooking. Use salt-free seasonings or herbs instead of table salt or sea salt. Check with your health care provider or pharmacist before using salt substitutes.  Do not fry foods. Cook foods  using healthy methods such as baking, boiling, grilling, and broiling instead.  Cook with heart-healthy oils, such as olive, canola, soybean, or sunflower oil. Meal planning  Eat a balanced diet that includes: ? 5 or more servings of fruits and vegetables each day. At each meal, try to fill half of your plate with fruits and vegetables. ? Up to 6-8 servings of whole grains each day. ? Less than 6 oz of lean meat, poultry, or fish each day. A 3-oz serving of meat is about the same size as a deck of cards. One egg equals 1 oz. ? 2 servings of low-fat dairy each day. ? A serving of nuts, seeds, or beans 5 times each week. ? Heart-healthy fats. Healthy fats called Omega-3 fatty acids are found in foods such as flaxseeds and coldwater fish, like sardines, salmon, and mackerel.  Limit how much you eat of the following: ? Canned or prepackaged foods. ? Food that is high in trans fat, such as fried foods. ? Food that is high in saturated fat, such as fatty meat. ? Sweets, desserts, sugary drinks, and other foods with added sugar. ? Full-fat dairy products.  Do not salt foods before eating.  Try to eat at least 2 vegetarian meals each week.  Eat more home-cooked food and less restaurant, buffet, and fast food.  When eating at a restaurant, ask that your food be prepared with less salt or no salt, if  possible. What foods are recommended? The items listed may not be a complete list. Talk with your dietitian about what dietary choices are best for you. Grains Whole-grain or whole-wheat bread. Whole-grain or whole-wheat pasta. Brown rice. [place]. Bulgur. Whole-grain and low-sodium cereals. Pita bread. Low-fat, low-sodium crackers. Whole-wheat flour tortillas. Vegetables Fresh or frozen vegetables (raw, steamed, roasted, or grilled). Low-sodium or reduced-sodium tomato and vegetable juice. Low-sodium or reduced-sodium tomato sauce and tomato paste. Low-sodium or reduced-sodium canned  vegetables. Fruits All fresh, dried, or frozen fruit. Canned fruit in natural juice (without added sugar). Meat and other protein foods Skinless chicken or [place]. Ground chicken or [place]. Pork with fat trimmed off. Fish and seafood. Egg whites. Dried beans, peas, or lentils. Unsalted nuts, nut butters, and seeds. Unsalted canned beans. Lean cuts of beef with fat trimmed off. Low-sodium, lean deli meat. Dairy Low-fat (1%) or fat-free (skim) milk. Fat-free, low-fat, or reduced-fat cheeses. Nonfat, low-sodium ricotta or cottage cheese. Low-fat or nonfat yogurt. Low-fat, low-sodium cheese. Fats and oils Soft margarine without trans fats. Vegetable oil. Low-fat, reduced-fat, or light mayonnaise and salad dressings (reduced-sodium). Canola, safflower, olive, soybean, and sunflower oils. Avocado. Seasoning and other foods Herbs. Spices. Seasoning mixes without salt. Unsalted popcorn and pretzels. Fat-free sweets. What foods are not recommended? The items listed may not be a complete list. Talk with your dietitian about what dietary choices are best for you. Grains Baked goods made with fat, such as croissants, muffins, or some breads. Dry pasta or rice meal packs. Vegetables Creamed or fried vegetables. Vegetables in a cheese sauce. Regular canned vegetables (not low-sodium or reduced-sodium). Regular canned tomato sauce and paste (not low-sodium or reduced-sodium). Regular tomato and vegetable juice (not low-sodium or reduced-sodium). Angie Fava. Olives. Fruits Canned fruit in a light or heavy syrup. Fried fruit. Fruit in cream or butter sauce. Meat and other protein foods Fatty cuts of meat. Ribs. Fried meat. Berniece Salines. Sausage. Bologna and other processed lunch meats. Salami. Fatback. Hotdogs. Bratwurst. Salted nuts and seeds. Canned beans with added salt. Canned or smoked fish. Whole eggs or egg yolks. Chicken or [place] with skin. Dairy Whole or 2% milk, cream, and half-and-half. Whole or full-fat  cream cheese. Whole-fat or sweetened yogurt. Full-fat cheese. Nondairy creamers. Whipped toppings. Processed cheese and cheese spreads. Fats and oils Butter. Stick margarine. Lard. Shortening. Ghee. Bacon fat. Tropical oils, such as coconut, palm kernel, or palm oil. Seasoning and other foods Salted popcorn and pretzels. Onion salt, garlic salt, seasoned salt, table salt, and sea salt. Worcestershire sauce. Tartar sauce. Barbecue sauce. Teriyaki sauce. Soy sauce, including reduced-sodium. Steak sauce. Canned and packaged gravies. Fish sauce. Oyster sauce. Cocktail sauce. Horseradish that you find on the shelf. Ketchup. Mustard. Meat flavorings and tenderizers. Bouillon cubes. Hot sauce and Tabasco sauce. Premade or packaged marinades. Premade or packaged taco seasonings. Relishes. Regular salad dressings. Where to find more information:  National Heart, Lung, and [place]: https://wilson-eaton.com/  American Heart Association: www.heart.org Summary  The DASH eating plan is a healthy eating plan that has been shown to reduce high blood pressure (hypertension). It may also reduce your risk for type 2 diabetes, heart disease, and stroke.  With the DASH eating plan, you should limit salt (sodium) intake to 2,300 mg a day. If you have hypertension, you may need to reduce your sodium intake to 1,500 mg a day.  When on the DASH eating plan, aim to eat more fresh fruits and vegetables, whole grains, lean proteins, low-fat dairy, and heart-healthy fats.  Work with your health care provider or diet and nutrition specialist (dietitian) to adjust your eating plan to your individual calorie needs. This information is not intended to replace advice given to you by your health care provider. Make sure you discuss any questions you have with your health care provider. Document Released: 05/18/2011 Document Revised: 05/22/2016 Document Reviewed: 05/22/2016 Elsevier Interactive Patient Education  2019 [employer].

## 2018-06-26 NOTE — Assessment & Plan Note (Addendum)
We discussed that past labs have indicated diabetes Update labs F/U with further recommendations pending lab results - CBC; Future - Comprehensive metabolic panel; Future - Hemoglobin A1c; Future - Lipid panel; Future

## 2018-06-26 NOTE — Assessment & Plan Note (Signed)
He is fasting Update labs F/U with further recommendations pending lab results, not currently compliant with statin - Lipid panel; Future

## 2018-06-26 NOTE — Assessment & Plan Note (Signed)
Stable Continue current medication - CBC; Future - Comprehensive metabolic panel; Future - Lipid panel; Future

## 2018-07-01 ENCOUNTER — Other Ambulatory Visit: Payer: Self-pay | Admitting: Nurse Practitioner

## 2018-07-01 DIAGNOSIS — E785 Hyperlipidemia, unspecified: Secondary | ICD-10-CM

## 2018-07-01 MED ORDER — ATORVASTATIN CALCIUM 10 MG PO TABS: 10.0000 mg | ORAL_TABLET | Freq: Every day | ORAL | 3 refills | Status: DC

## 2018-07-01 MED ORDER — AMLODIPINE BESYLATE 2.5 MG PO TABS: 2.5000 mg | ORAL_TABLET | Freq: Every day | ORAL | 3 refills | Status: DC

## 2018-07-01 MED ORDER — LOSARTAN POTASSIUM 100 MG PO TABS: 100.0000 mg | ORAL_TABLET | Freq: Every day | ORAL | 3 refills | Status: DC

## 2018-07-18 ENCOUNTER — Ambulatory Visit (INDEPENDENT_AMBULATORY_CARE_PROVIDER_SITE_OTHER): Payer: Medicare Other | Admitting: Nurse Practitioner

## 2018-07-18 ENCOUNTER — Encounter: Payer: Self-pay | Admitting: Nurse Practitioner

## 2018-07-18 VITALS — BP 142/72 | HR 76 | Ht 71.0 in | Wt 196.0 lb

## 2018-07-18 DIAGNOSIS — I10 Essential (primary) hypertension: Secondary | ICD-10-CM | POA: Diagnosis not present

## 2018-07-18 NOTE — Patient Instructions (Addendum)
Your blood pressure is stable , continue current medications.  Head downstairs for labs today  Please return in about 3 months for blood pressure follow up

## 2018-07-18 NOTE — Assessment & Plan Note (Signed)
BP slightly high, okay for age 78 BMET for potassium level today Continue current medications RTC in 3 months for f/u, could consider increasing amlodipine if his BP remains elevated

## 2018-07-18 NOTE — Progress Notes (Signed)
Stanley Lyness is a 78 y.o. male with the following history as recorded in EpicCare:  Patient Active Problem List   Diagnosis Date Noted  . Hyperlipidemia 12/24/2017  . Alcohol use 12/24/2017  . Non-compliance 12/24/2017  . 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 Left lumbar radiculopathy 08/02/2017  . Atherosclerosis 06/03/2017  . Low-level of literacy 05/12/2017  . Osteoarthritis 04/20/2017  . Diabetes ([place]) 03/12/2017  . Acute non-recurrent frontal sinusitis 01/04/2017  . COPD exacerbation ([place]) 12/14/2016  . Acute upper respiratory infection 06/20/2016  . Back pain 02/29/2016  . Abnormal stress test 10/18/2015  . Erectile dysfunction 02/25/2015  . Cigarette smoker 02/10/2015  . COPD GOLD 0 11/08/2014  . Hemorrhoids 02/11/2014  . 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 Headache 10/23/2011  . 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[place]) 10/23/2011  . GERD (gastroesophageal reflux disease) 10/23/2011  . Nicotine addiction 10/23/2011  . 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 Cervical stenosis of spinal canal 10/23/2011  . Essential hypertension, benign 07/10/2011    Current Outpatient Medications  Medication Sig Dispense Refill  . acetaminophen (TYLENOL) 500 MG tablet Take 500 mg by mouth every 6 (six) hours as needed.    Marland Kitchen amLODipine (NORVASC) 2.5 MG tablet Take 1 tablet (2.5 mg total) by mouth daily. 30 tablet 3  . aspirin EC 81 MG tablet Take 81 mg by mouth daily.    Marland Kitchen atorvastatin (LIPITOR) 10 MG tablet Take 1 tablet (10 mg total) by mouth daily. 90 tablet 3  . gabapentin (NEURONTIN) 100 MG capsule TAKE 2 CAPSULES BY MOUTH AT BEDTIME (Patient taking differently: Take 100 mg by mouth at bedtime. ) 180 capsule 1  . losartan (COZAAR) 100 MG tablet Take 1 tablet (100 mg total) by mouth daily. 30 tablet 3  . naproxen sodium (ALEVE) 220 MG tablet Take 220 mg by mouth as needed (headache).     No current facility-administered medications for this visit.     Allergies: Patient has no known  allergies.  Past Medical History:  Diagnosis Date  . Arthritis   . Coronary artery disease, non-occlusive    a. 06/2011: cath for abnormal exercise echo: Only 30-40% proximal RCA. Otherwise normal. b. 10/2015: cath for abnormal NST and atypical CP --> showed mild 30-40% stenosis in the prox-distal RCA  . GERD (gastroesophageal reflux disease)   . HA (headache)   . Hypertension   . 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[place])   . TIA (transient ischemic attack)   . Weakness     Past Surgical History:  Procedure Laterality Date  . CARDIAC CATHETERIZATION  January 2013   Proximal RCA 30-40%. Otherwise normal.  . CARDIAC CATHETERIZATION N/A 10/18/2015   Procedure: Left Heart Cath and Coronary Angiography;  Surgeon: Jettie Booze, MD;  Location: [facility];  Service: Cardiovascular;  Laterality: N/A;  . NM MYOVIEW LTD  March 2015   LOW RISK. No scar or ischemia. Normal EF (55%). No RWMA  . surgical excision of rectal cancer      Family History  Problem Relation Age of Onset  . Hypertension Mother   . Cancer Mother   . Liver disease Father     Social History   Tobacco Use  . Smoking status: Current Some Day Smoker    Types: Cigarettes  . Smokeless tobacco: Never Used  Substance Use Topics  . Alcohol use: Yes    Alcohol/week: 2.0 standard drinks    Types: 2 Cans of beer per week     Subjective:  Mr Bain  is here today for follow up of Hypertension. Due to continued hypokalemia noted on labs at 1/15 OV, he was instructed to stop losartan-HCTZ, take 2 additional doses of potassium then stop potassium, and start losartan 100 and amlodipine 2.5 daily  He is back today for follow up, has brought all of his medication bottles with him and Reports daily medication compliance without adverse medication effects, does appear to have all the correct prescriptions/dosages. He does not check his BP regularly at home He says he is feeling well today. Denies weakness, syncope, headaches, vision changes,  chest pain, shortness of breath, edema.  BP Readings from Last 3 Encounters:  07/18/18 (!) 142/72  06/26/18 140/80  06/21/18 110/66   ROS- See HPI  Objective:  Vitals:   07/18/18 0852 07/18/18 0914  BP: (!) 150/72 (!) 142/72  Pulse: 76   SpO2: 98%   Weight: 196 lb (88.9 kg)   Height: 5\' 11"  (1.803 m)     General: Well developed, well nourished, in no acute distress  Skin : Warm and dry.  Head: Normocephalic and atraumatic  Eyes: Sclera and conjunctiva clear; pupils round and reactive to light; extraocular movements intact  Oropharynx: Pink, supple. No suspicious lesions  Neck: Supple Lungs: Respirations unlabored; clear to auscultation bilaterally  CVS exam: normal rate and regular rhythm, S1 and S2 normal.  Extremities: No edema, cyanosis, clubbing  Vessels: Symmetric bilaterally  Neurologic: Alert and oriented; speech intact; face symmetrical; moves all extremities well; CNII-XII intact without focal deficit  Psychiatric: Normal mood and affect.   Assessment:  1. Essential hypertension, benign     Plan:   Return in about 3 months (around 10/16/2018) for Blood pressure follow up.  Orders Placed This Encounter  Procedures  . Basic metabolic panel    Standing Status:   Future    Standing Expiration Date:   07/19/2019    Requested Prescriptions    No prescriptions requested or ordered in this encounter

## 2018-07-26 ENCOUNTER — Ambulatory Visit (INDEPENDENT_AMBULATORY_CARE_PROVIDER_SITE_OTHER): Payer: Medicare Other | Admitting: Podiatry

## 2018-07-26 ENCOUNTER — Encounter: Payer: Self-pay | Admitting: Podiatry

## 2018-07-26 VITALS — BP 175/91 | HR 65

## 2018-07-26 DIAGNOSIS — E119 Type 2 diabetes mellitus without complications: Secondary | ICD-10-CM | POA: Diagnosis not present

## 2018-07-26 DIAGNOSIS — M79675 Pain in left toe(s): Secondary | ICD-10-CM | POA: Diagnosis not present

## 2018-07-26 DIAGNOSIS — M79674 Pain in right toe(s): Secondary | ICD-10-CM

## 2018-07-26 DIAGNOSIS — B351 Tinea unguium: Secondary | ICD-10-CM | POA: Diagnosis not present

## 2018-07-26 NOTE — Progress Notes (Signed)
This patient presents to the office with chief complaint of long thick nails and diabetic feet.  This patient  says there  is  no pain and discomfort in his feet.  This patient says there are long thick painful nails.  These nails are painful walking and wearing shoes.  Patient has no history of infection or drainage from both feet.  Patient is unable to  self treat his own nails .Patient cannot remember the last time his nails were done. This patient presents  to the office today for treatment of the  long nails and a foot evaluation due to history of  diabetes.  General Appearance  Alert, conversant and in no acute stress.  Vascular  Dorsalis pedis and posterior tibial  pulses are palpable  bilaterally.  Capillary return is within normal limits  bilaterally. Temperature is within normal limits  bilaterally.  Neurologic  Senn-Weinstein monofilament wire test within normal limits  bilaterally. Muscle power within normal limits bilaterally.  Nails Thick disfigured discolored nails with subungual debris  from hallux to fifth toes bilaterally. No evidence of bacterial infection or drainage bilaterally.  Orthopedic  No limitations of motion of motion feet .  No crepitus or effusions noted.  No bony pathology or digital deformities noted.  Skin  normotropic skin with no porokeratosis noted bilaterally.  No signs of infections or ulcers noted.     Onychomycosis  Diabetes with no foot complications  IE  Debride nails x 10.  A diabetic foot exam was performed and there is no evidence of any vascular or neurologic pathology.   RTC 3 months.   Gardiner Barefoot DPM

## 2018-08-27 ENCOUNTER — Other Ambulatory Visit: Payer: Self-pay | Admitting: Nurse Practitioner

## 2018-10-17 ENCOUNTER — Ambulatory Visit: Payer: Medicare Other | Admitting: Nurse Practitioner

## 2018-10-23 ENCOUNTER — Other Ambulatory Visit: Payer: Self-pay | Admitting: *Deleted

## 2018-10-23 MED ORDER — AMLODIPINE BESYLATE 2.5 MG PO TABS: 2.5000 mg | ORAL_TABLET | Freq: Every day | ORAL | 2 refills | Status: DC

## 2018-10-30 ENCOUNTER — Ambulatory Visit: Payer: Medicare Other | Admitting: Podiatry

## 2018-12-12 ENCOUNTER — Telehealth: Payer: Self-pay | Admitting: Nurse Practitioner

## 2018-12-12 NOTE — Telephone Encounter (Incomplete)
Medication Refill - Medication: atorvastatin (LIPITOR) 10 MG tablet [[id number]]   amLODipine (NORVASC) 2.5 MG tablet [[id number]]    Has the patient contacted their pharmacy? {yes [id number]} (Agent: If no, request that the patient contact the pharmacy for the refill.) (Agent: If yes, when and what did the pharmacy advise?)  Preferred Pharmacy (with phone number or street name):  [facility] [phone] - [place], [place] - [address] [phone] (Phone) [phone] (Fax)    Agent: Please be advised that RX refills may take up to 3 business days. We ask that you follow-up with your pharmacy.

## 2018-12-17 ENCOUNTER — Other Ambulatory Visit: Payer: Self-pay | Admitting: *Deleted

## 2018-12-17 ENCOUNTER — Other Ambulatory Visit: Payer: Self-pay | Admitting: Internal Medicine

## 2018-12-17 MED ORDER — AMLODIPINE BESYLATE 2.5 MG PO TABS: 2.5000 mg | ORAL_TABLET | Freq: Every day | ORAL | 0 refills | Status: DC

## 2018-12-17 MED ORDER — LOSARTAN POTASSIUM 100 MG PO TABS: 100.0000 mg | ORAL_TABLET | Freq: Every day | ORAL | 0 refills | Status: DC

## 2018-12-26 ENCOUNTER — Ambulatory Visit: Payer: Medicare Other | Admitting: Nurse Practitioner

## 2019-01-03 ENCOUNTER — Encounter: Payer: Medicare Other | Admitting: Family Medicine

## 2019-01-12 ENCOUNTER — Other Ambulatory Visit: Payer: Self-pay | Admitting: Internal Medicine

## 2019-01-13 ENCOUNTER — Other Ambulatory Visit: Payer: Self-pay | Admitting: Internal Medicine

## 2019-02-03 ENCOUNTER — Other Ambulatory Visit: Payer: Self-pay | Admitting: Internal Medicine

## 2019-02-20 ENCOUNTER — Other Ambulatory Visit: Payer: Self-pay

## 2019-02-20 NOTE — Patient Outreach (Signed)
[facility]) Care Management  02/20/2019  Andre [name] [birth year] [id number]   Medication Adherence call to Mr. Andre [name] [facility] Identifiers Verify spoke with patient he is past due on Losartan 100 mg patient explain he take 1 tablet daily  Patient pick up this medication from the pharmacy a couple of days ago. Mr. Andre [name] is showing past due under [place].   [place] Management Direct Dial [phone]  Fax [phone] [email or web address]

## 2019-03-13 ENCOUNTER — Other Ambulatory Visit: Payer: Self-pay | Admitting: Internal Medicine

## 2019-03-20 ENCOUNTER — Emergency Department (HOSPITAL_COMMUNITY)
Admission: EM | Admit: 2019-03-20 | Discharge: 2019-03-21 | Disposition: A | Discharge status: Home / Self Care | Payer: Medicare Other | Attending: Emergency Medicine | Admitting: Emergency Medicine

## 2019-03-20 ENCOUNTER — Ambulatory Visit: Payer: Self-pay | Admitting: *Deleted

## 2019-03-20 ENCOUNTER — Encounter (HOSPITAL_COMMUNITY): Payer: Self-pay | Admitting: Emergency Medicine

## 2019-03-20 ENCOUNTER — Emergency Department (HOSPITAL_COMMUNITY): Payer: Medicare Other

## 2019-03-20 ENCOUNTER — Other Ambulatory Visit: Payer: Self-pay

## 2019-03-20 DIAGNOSIS — R040 Epistaxis: Secondary | ICD-10-CM | POA: Insufficient documentation

## 2019-03-20 DIAGNOSIS — R079 Chest pain, unspecified: Secondary | ICD-10-CM | POA: Insufficient documentation

## 2019-03-20 DIAGNOSIS — Z5321 Procedure and treatment not carried out due to patient leaving prior to being seen by health care provider: Secondary | ICD-10-CM | POA: Diagnosis not present

## 2019-03-20 LAB — BASIC METABOLIC PANEL
Anion gap: 12 (ref 5–15)
BUN: 5 mg/dL — ABNORMAL LOW (ref 8–23)
CO2: 23 mmol/L (ref 22–32)
Calcium: 9 mg/dL (ref 8.9–10.3)
Chloride: 104 mmol/L (ref 98–111)
Creatinine, Ser: 1.02 mg/dL (ref 0.61–1.24)
GFR calc Af Amer: >60 mL/min (ref >60)
GFR calc non Af Amer: >60 mL/min (ref >60)
Glucose, Bld: 144 mg/dL — ABNORMAL HIGH (ref 70–99)
Potassium: 3.2 mmol/L — ABNORMAL LOW (ref 3.5–5.1)
Sodium: 139 mmol/L (ref 135–145)

## 2019-03-20 LAB — CBC
HCT: 41.9 % (ref 39.0–52.0)
Hemoglobin: 13.9 g/dL (ref 13.0–17.0)
MCH: 34.2 pg — ABNORMAL HIGH (ref 26.0–34.0)
MCHC: 33.2 g/dL (ref 30.0–36.0)
MCV: 103.2 fL — ABNORMAL HIGH (ref 80.0–100.0)
Platelets: 199 10*3/uL (ref 150–400)
RBC: 4.06 MIL/uL — ABNORMAL LOW (ref 4.22–5.81)
RDW: 15.5 % (ref 11.5–15.5)
WBC: 6.7 10*3/uL (ref 4.0–10.5)
nRBC: 0 % (ref 0.0–0.2)

## 2019-03-20 LAB — TROPONIN I (HIGH SENSITIVITY): Troponin I (High Sensitivity): 8 ng/L (ref <18)

## 2019-03-20 MED ORDER — SODIUM CHLORIDE 0.9% FLUSH: 3.0000 mL | Freq: Once | INTRAVENOUS | Status: DC

## 2019-03-20 NOTE — ED Triage Notes (Signed)
[name] intermittent chest pain x 2 days. Also reports nose bleeds in the morning on waking. States he has not been taking his BP meds as prescribed.

## 2019-03-20 NOTE — Telephone Encounter (Signed)
Pt called with complaints of headache, nose bleeds, and chest pain for 1 week; the nose bleeds occurs when he gets up in the morning; his chest pain intermtitent, and alka seltzer helped when he ate but now the pain is constatnt; his headache is intermittent, and he says that he has not been getting his hight BP pills; he has no checked it in a while; it was last checked it 2 months ago in the office; he is out of norvasc and losartan; recommendations made per nurse triage protocol; he verbalized understanding; pt also advised to call back to schedule his transfer of care appt; he was last see by [name], 07/18/2018; will route to office for notifiction.  Reason for Disposition . Pain also in shoulder(s) or arm(s) or jaw  Answer Assessment - Initial Assessment Questions 1. LOCATION: "Where does it hurt?"       Left side under breast and front of shoulder 2. RADIATION: "Does the pain go anywhere else?" (e.g., into neck, jaw, arms, back)    Front of left shoulder 3. ONSET: "When did the chest pain begin?" (Minutes, hours or days)     03/13/2019 4. PATTERN "Does the pain come and go, or has it been constant since it started?"  "Does it get worse with exertion?"      no 5. DURATION: "How long does it last" (e.g., seconds, minutes, hours)    days 6. SEVERITY: "How bad is the pain?"  (e.g., Scale 1-10; mild, moderate, or severe)    - MILD (1-3): doesn't interfere with normal activities     - MODERATE (4-7): interferes with normal activities or awakens from sleep    - SEVERE (8-10): excruciating pain, unable to do any normal activities      7 out 10 7. CARDIAC RISK FACTORS: "Do you have any history of heart problems or risk factors for heart disease?" (e.g., angina, prior heart attack; diabetes, high blood pressure, high cholesterol, smoker, or strong family history of heart disease)     htn 8. PULMONARY RISK FACTORS: "Do you have any history of lung disease?"  (e.g., blood clots in lung,  asthma, emphysema, birth control pills)   Former smoker; quit 2 weeks ago 9. CAUSE: "What do you think is causing the chest pain?"     High blood pressure 10. OTHER SYMPTOMS: "Do you have any other symptoms?" (e.g., dizziness, nausea, vomiting, sweating, fever, difficulty breathing, cough)       headache 11. PREGNANCY: "Is there any chance you are pregnant?" "When was your last menstrual period?"    n/a  Protocols used: CHEST PAIN-A-AH

## 2019-03-21 NOTE — ED Notes (Signed)
Pt did not respond when called for vitals check 

## 2019-03-23 DIAGNOSIS — R079 Chest pain, unspecified: Secondary | ICD-10-CM | POA: Insufficient documentation

## 2019-03-23 DIAGNOSIS — R040 Epistaxis: Secondary | ICD-10-CM | POA: Insufficient documentation

## 2019-03-23 NOTE — Patient Instructions (Addendum)
    Medications reviewed and updated.  Changes include :   Restart medications  Your prescription(s) have been submitted to your pharmacy. Please take as directed and contact our office if you believe you are having problem(s) with the medication(s).  A referral was ordered for cardiology

## 2019-03-23 NOTE — Progress Notes (Signed)
Subjective:    Patient ID: Andre [name], male    DOB: 12/06/1940, 78 y.o.   MRN: [id number]  HPI The patient is here for follow up from the ED.   He went to the ED on 03/20/19 after calling our office.  He complained of headache, nose bleeds and chest pain for one week.  The nosebleeds were when he got up in the morning.  His headaches were intermittent.  He had not been taking his BP medications for a while and was not checking his BP.  he was out of losartan and norvasc.  His chest pain was intermittent.  Alka seltzer helped when he ate, but when he called it was constant.    In the ED his CXR showed no acute cardiopulmonary disease.  troponin was normal, CBC unremarkable, potassium was slightly low, kidney function was normal.  EKG showed SR at 68 bpm with PACs, otherwise normal.  He left the ED and was not seen.     He had nose bleeds two days in a row and has not had any since.  He never had this before.   Chest pain:   He has intermittent soreness in the left side of his chest.  It usually occurs when he is resting.  He is not exercising regularly.   He walks about one block a day and denies pain when he walks.  He last had the pain yesterday.  The pain started about two weeks ago. He smokes 4-5 cigs a day.  He drinks alcohol on the weekends.  He denies palpitations, shortness of breath or lower extremity edema.  Hypertension:  He is not any medication for his BP.  Since his previous PCP left he did not get a refill for his medication, which is why he has been out of it.  He states when he was taking the medication regularly his blood pressure was well controlled.  He does want to restart it.  Hyperlipidemia: He has also not been taking his cholesterol medication.  He states he thinks he eats relatively healthy.  He is not exercising.  Diabetes: His diabetes is diet controlled.  He is not on any medication.  He is not compliant with a diabetic diet-he states he eats what he wants.  He is  due for blood work.   Medications and allergies reviewed with patient and updated if appropriate.  Patient Active Problem List   Diagnosis Date Noted  . Chest pain 03/23/2019  . Epistaxis 03/23/2019  . Hyperlipidemia 12/24/2017  . Alcohol use 12/24/2017  . Non-compliance 12/24/2017  . 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 Left lumbar radiculopathy 08/02/2017  . Atherosclerosis 06/03/2017  . Low-level of literacy 05/12/2017  . Osteoarthritis 04/20/2017  . Diabetes ([place]) 03/12/2017  . Back pain 02/29/2016  . Abnormal stress test 10/18/2015  . Erectile dysfunction 02/25/2015  . Cigarette smoker 02/10/2015  . COPD GOLD 0 11/08/2014  . Hemorrhoids 02/11/2014  . 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 Headache 10/23/2011  . 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[place]) 10/23/2011  . GERD (gastroesophageal reflux disease) 10/23/2011  . Nicotine addiction 10/23/2011  . 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 Cervical stenosis of spinal canal 10/23/2011  . Essential hypertension, benign 07/10/2011    Current Outpatient Medications on File Prior to Visit  Medication Sig Dispense Refill  . acetaminophen (TYLENOL) 500 MG tablet Take 500 mg by mouth every 6 (six) hours as needed.    . naproxen sodium (ALEVE) 220 MG tablet  Take 220 mg by mouth as needed (headache).    . [DISCONTINUED] chlorthalidone (HYGROTON) 25 MG tablet Take 25 mg by mouth daily.     No current facility-administered medications on file prior to visit.     Past Medical History:  Diagnosis Date  . Arthritis   . Coronary artery disease, non-occlusive    a. 06/2011: cath for abnormal exercise echo: Only 30-40% proximal RCA. Otherwise normal. b. 10/2015: cath for abnormal NST and atypical CP --> showed mild 30-40% stenosis in the prox-distal RCA  . GERD (gastroesophageal reflux disease)   . HA (headache)   . Hypertension   . 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[place])   . TIA (transient ischemic attack)   . Weakness     Past Surgical History:   Procedure Laterality Date  . CARDIAC CATHETERIZATION  January 2013   Proximal RCA 30-40%. Otherwise normal.  . CARDIAC CATHETERIZATION N/A 10/18/2015   Procedure: Left Heart Cath and Coronary Angiography;  Surgeon: Jettie Booze, MD;  Location: [facility];  Service: Cardiovascular;  Laterality: N/A;  . NM MYOVIEW LTD  March 2015   LOW RISK. No scar or ischemia. Normal EF (55%). No RWMA  . surgical excision of rectal cancer      Social History   Socioeconomic History  . Marital status: Single    Spouse name: Not on file  . Number of children: 11  . Years of education: 58  . Highest education level: Not on file  Occupational History  . Occupation: Retried    Comment: retired  Scientific laboratory technician  . Financial resource strain: Not on file  . Food insecurity    Worry: Not on file    Inability: Not on file  . Transportation needs    Medical: Not on file    Non-medical: Not on file  Tobacco Use  . Smoking status: Current Some Day Smoker    Types: Cigarettes  . Smokeless tobacco: Never Used  Substance and Sexual Activity  . Alcohol use: Yes    Alcohol/week: 2.0 standard drinks    Types: 2 Cans of beer per week  . Drug use: No  . Sexual activity: Not on file  Lifestyle  . Physical activity    Days per week: Not on file    Minutes per session: Not on file  . Stress: Not on file  Relationships  . Social Herbalist on phone: Not on file    Gets together: Not on file    Attends religious service: Not on file    Active member of club or organization: Not on file    Attends meetings of clubs or organizations: Not on file    Relationship status: Not on file  Other Topics Concern  . Not on file  Social History Narrative   Patient is single, retired   Patient is right handed   Education level is high school   Caffeine consumption is 1 cup daily    Family History  Problem Relation Age of Onset  . Hypertension Mother   . Cancer Mother   . Liver disease  Father     Review of Systems  Constitutional: Negative for chills and fever.  Respiratory: Negative for cough, shortness of breath and wheezing.   Cardiovascular: Positive for chest pain. Negative for palpitations and leg swelling.  Gastrointestinal: Negative for abdominal pain and nausea.       No gerd  Neurological: Positive for headaches. Negative for dizziness and light-headedness.  Objective:   Vitals:   03/24/19 1315  BP: (!) 168/70  Pulse: 75  Resp: 16  Temp: 98.2 F (36.8 C)  SpO2: 99%   BP Readings from Last 3 Encounters:  03/24/19 (!) 168/70  03/20/19 (!) 196/82  07/26/18 (!) 175/91   Wt Readings from Last 3 Encounters:  03/24/19 186 lb (84.4 kg)  07/18/18 196 lb (88.9 kg)  06/26/18 191 lb (86.6 kg)   Body mass index is 25.94 kg/m.   Physical Exam    Constitutional: Appears well-developed and well-nourished. No distress.  HENT:  Head: Normocephalic and atraumatic.  Neck: Neck supple. No tracheal deviation present. No thyromegaly present.  No cervical lymphadenopathy Cardiovascular: Normal rate, regular rhythm and normal heart sounds.   No murmur heard. No carotid bruit .  No edema Pulmonary/Chest: Effort normal and breath sounds normal. No respiratory distress. No has no wheezes. No rales.  Abd: soft, distended, non tender, no ascites Skin: Skin is warm and dry. Not diaphoretic.  Psychiatric: Normal mood and affect. Behavior is normal.    Lab Results  Component Value Date   WBC 6.7 03/20/2019   HGB 13.9 03/20/2019   HCT 41.9 03/20/2019   PLT 199 03/20/2019   GLUCOSE 144 (H) 03/20/2019   CHOL 158 06/26/2018   TRIG 124.0 06/26/2018   HDL 33.80 (L) 06/26/2018   LDLCALC 99 06/26/2018   ALT 22 06/26/2018   AST 41 (H) 06/26/2018   NA 139 03/20/2019   K 3.2 (L) 03/20/2019   CL 104 03/20/2019   CREATININE 1.02 03/20/2019   BUN 5 (L) 03/20/2019   CO2 23 03/20/2019   TSH 0.950 11/22/2016   INR 1.09 10/18/2015   HGBA1C 7.0 (H) 06/26/2018     DG Chest 2 View CLINICAL DATA:  Chest pain headache  EXAM: CHEST - 2 VIEW  COMPARISON:  06/21/2018  FINDINGS: No consolidation or pleural effusion. Mild cardiomegaly. No pneumothorax.  IMPRESSION: No active cardiopulmonary disease.  Stable mild cardiomegaly.  Electronically Signed   By: Donavan Foil M.D.   On: 03/20/2019 19:59   Assessment & Plan:    See Problem List for Assessment and Plan of chronic medical problems.

## 2019-03-24 ENCOUNTER — Encounter: Payer: Self-pay | Admitting: Internal Medicine

## 2019-03-24 ENCOUNTER — Telehealth: Payer: Self-pay | Admitting: Internal Medicine

## 2019-03-24 ENCOUNTER — Ambulatory Visit (INDEPENDENT_AMBULATORY_CARE_PROVIDER_SITE_OTHER): Payer: Medicare Other | Admitting: Internal Medicine

## 2019-03-24 ENCOUNTER — Other Ambulatory Visit: Payer: Self-pay

## 2019-03-24 VITALS — BP 168/70 | HR 75 | Temp 98.2°F | Resp 16 | Ht 71.0 in | Wt 186.0 lb

## 2019-03-24 DIAGNOSIS — R040 Epistaxis: Secondary | ICD-10-CM

## 2019-03-24 DIAGNOSIS — R0789 Other chest pain: Secondary | ICD-10-CM

## 2019-03-24 DIAGNOSIS — G8929 Other chronic pain: Secondary | ICD-10-CM

## 2019-03-24 DIAGNOSIS — E119 Type 2 diabetes mellitus without complications: Secondary | ICD-10-CM

## 2019-03-24 DIAGNOSIS — R519 Headache, unspecified: Secondary | ICD-10-CM

## 2019-03-24 DIAGNOSIS — I10 Essential (primary) hypertension: Secondary | ICD-10-CM | POA: Diagnosis not present

## 2019-03-24 MED ORDER — AMLODIPINE BESYLATE 2.5 MG PO TABS: 2.5000 mg | ORAL_TABLET | Freq: Every day | ORAL | 0 refills | Status: DC

## 2019-03-24 MED ORDER — ATORVASTATIN CALCIUM 20 MG PO TABS: 20.0000 mg | ORAL_TABLET | Freq: Every day | ORAL | 0 refills | Status: DC

## 2019-03-24 MED ORDER — ASPIRIN EC 81 MG PO TBEC: 81.0000 mg | DELAYED_RELEASE_TABLET | Freq: Every day | ORAL | Status: DC

## 2019-03-24 MED ORDER — LOSARTAN POTASSIUM 100 MG PO TABS: 100.0000 mg | ORAL_TABLET | Freq: Every day | ORAL | 0 refills | Status: DC

## 2019-03-24 NOTE — Assessment & Plan Note (Signed)
Having chest pain, which is fairly atypical, but has numerous risk factors Cardiac evaluation in 2017 included cardiac catheterization and nuclear stress test May not need further work-up, but will let cardiology evaluate and determine if further testing is necessary Restart your blood pressure medications Needs to work on risk reduction-needs to establish with new PCP

## 2019-03-24 NOTE — Assessment & Plan Note (Signed)
Diet controlled Last A1c 7.0% Needs blood work, just had blood work done in the ED so advised that he needs to establish with a PCP to have this followed up on

## 2019-03-24 NOTE — Assessment & Plan Note (Signed)
Likely related to uncontrolled BP from not being on his blood pressure medication Medication restarted

## 2019-03-24 NOTE — Assessment & Plan Note (Signed)
Currently uncontrolled due to not being on medication Restart losartan 100 mg daily and amlodipine 2.5 mg daily Needs a follow-up, but needs to establish with a new PCP-we will see if someone else in the office will accept him

## 2019-03-24 NOTE — Telephone Encounter (Signed)
Patient of Andre [name].  Would like to know if we could transfer over to [name]?  Please advise.

## 2019-03-24 NOTE — Telephone Encounter (Signed)
Ok with me 

## 2019-03-24 NOTE — Assessment & Plan Note (Signed)
Only occurred 2 days in a row and has not had any bleeding since then Monitor only

## 2019-03-26 ENCOUNTER — Telehealth: Payer: Self-pay | Admitting: Internal Medicine

## 2019-03-26 NOTE — Telephone Encounter (Signed)
No - we just restarted his medications he had been taking.  He stated that his BP was controlled on those meds when he was on them so there is no need for additional medication at this time.  When he sees Dr [name] he will be able to evaluate if additional medication is needed.

## 2019-03-26 NOTE — Telephone Encounter (Signed)
Patient states he thought he was to have a new BP med called into his pharmacy from the last time he seen Dr. Quay Burow.  Please advise.

## 2019-03-26 NOTE — Telephone Encounter (Signed)
Pt aware and expressed understanding.

## 2019-04-22 NOTE — Progress Notes (Deleted)
Cardiology Office Note:   Date:  04/22/2019  NAME:  Andre [name]    MRN: [id number] DOB:  1940-12-14   PCP:  Andre Sell, NP  Cardiologist:  No primary care provider on file.  Electrophysiologist:  None   Referring MD: Andre Rail, MD   No chief complaint on file. ***  History of Present Illness:   Andre [name] is a 78 y.o. male with a hx of non-obstructive CAD, HTN, diet controlled DM who is being seen today for the evaluation of chest pain at the request of Andre Sell, NP. LHC performed 10/18/2015 due to abnormal stress test and found to have LI in the LAD/LCX, 30% pRCA, 40% dRCA.   Past Medical History: Past Medical History:  Diagnosis Date  . Arthritis   . Coronary artery disease, non-occlusive    a. 06/2011: cath for abnormal exercise echo: Only 30-40% proximal RCA. Otherwise normal. b. 10/2015: cath for abnormal NST and atypical CP --> showed mild 30-40% stenosis in the prox-distal RCA  . GERD (gastroesophageal reflux disease)   . HA (headache)   . Hypertension   . 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[place])   . TIA (transient ischemic attack)   . Weakness     Past Surgical History: Past Surgical History:  Procedure Laterality Date  . CARDIAC CATHETERIZATION  January 2013   Proximal RCA 30-40%. Otherwise normal.  . CARDIAC CATHETERIZATION N/A 10/18/2015   Procedure: Left Heart Cath and Coronary Angiography;  Surgeon: Andre Booze, MD;  Location: [facility];  Service: Cardiovascular;  Laterality: N/A;  . NM MYOVIEW LTD  March 2015   LOW RISK. No scar or ischemia. Normal EF (55%). No RWMA  . surgical excision of rectal cancer      Current Medications: No outpatient medications have been marked as taking for the 04/23/19 encounter (Appointment) with [name], Andre Freer, MD.     Allergies:    Patient has no known allergies.   Social History: Social History   Socioeconomic History  . Marital status: Single    Spouse name: Not on file  . Number of  children: 11  . Years of education: 47  . Highest education level: Not on file  Occupational History  . Occupation: Retried    Comment: retired  Scientific laboratory technician  . Financial resource strain: Not on file  . Food insecurity    Worry: Not on file    Inability: Not on file  . Transportation needs    Medical: Not on file    Non-medical: Not on file  Tobacco Use  . Smoking status: Current Some Day Smoker    Types: Cigarettes  . Smokeless tobacco: Never Used  Substance and Sexual Activity  . Alcohol use: Yes    Alcohol/week: 2.0 standard drinks    Types: 2 Cans of beer per week  . Drug use: No  . Sexual activity: Not on file  Lifestyle  . Physical activity    Days per week: Not on file    Minutes per session: Not on file  . Stress: Not on file  Relationships  . Social Herbalist on phone: Not on file    Gets together: Not on file    Attends religious service: Not on file    Active member of club or organization: Not on file    Attends meetings of clubs or organizations: Not on file    Relationship status: Not on file  Other Topics Concern  . Not  on file  Social History Narrative   Patient is single, retired   Patient is right handed   Education level is high school   Caffeine consumption is 1 cup daily     Family History: The patient's ***family history includes Cancer in his mother; Hypertension in his mother; Liver disease in his father.  ROS:   All other ROS reviewed and negative. Pertinent positives noted in the HPI.     EKGs/Labs/Other Studies Reviewed:   The following studies were personally reviewed by me today:  EKG:  EKG is *** ordered today.  The ekg ordered today demonstrates ***, and was personally reviewed by me.   LHC 10/2015  Nonobstructive CAD.  Mildly elevated LVEDP.  LV was not injected due to tortuosity in the radial and subclavian.  If cath was needed in the future, would not use right radial approach.   Continue aggressive  preventive therapy.  Recent Labs: 06/26/2018: ALT 22 03/20/2019: BUN 5; Creatinine, Ser 1.02; Hemoglobin 13.9; Platelets 199; Potassium 3.2; Sodium 139   Recent Lipid Panel    Component Value Date/Time   CHOL 158 06/26/2018 1011   TRIG 124.0 06/26/2018 1011   HDL 33.80 (L) 06/26/2018 1011   CHOLHDL 5 06/26/2018 1011   VLDL 24.8 06/26/2018 1011   LDLCALC 99 06/26/2018 1011    Physical Exam:   VS:  There were no vitals taken for this visit.   Wt Readings from Last 3 Encounters:  03/24/19 186 lb (84.4 kg)  07/18/18 196 lb (88.9 kg)  06/26/18 191 lb (86.6 kg)    General: Well nourished, well developed, in no acute distress Heart: Atraumatic, normal size  Eyes: PEERLA, EOMI  Neck: Supple, no JVD Endocrine: No thryomegaly Cardiac: Normal S1, S2; RRR; no murmurs, rubs, or gallops Lungs: Clear to auscultation bilaterally, no wheezing, rhonchi or rales  Abd: Soft, nontender, no hepatomegaly  Ext: No edema, pulses 2+ Musculoskeletal: No deformities, BUE and BLE strength normal and equal Skin: Warm and dry, no rashes   Neuro: Alert and oriented to person, place, time, and situation, CNII-XII grossly intact, no focal deficits  Psych: Normal mood and affect   ASSESSMENT:   Andre [name] is a 78 y.o. male who presents for the following: 1. Other chest pain   2. Coronary artery disease involving native coronary artery of native heart without angina pectoris   3. Essential hypertension     PLAN:   There are no diagnoses linked to this encounter.  Disposition: No follow-ups on file.  Medication Adjustments/Labs and Tests Ordered: Current medicines are reviewed at length with the patient today.  Concerns regarding medicines are outlined above.  No orders of the defined types were placed in this encounter.  No orders of the defined types were placed in this encounter.   There are no Patient Instructions on file for this visit.   Signed, Andre [name]. Audie Box, [place]  [address] [place], [place] [address] [phone]  04/22/2019 8:08 PM

## 2019-04-23 ENCOUNTER — Ambulatory Visit: Payer: Medicare Other | Admitting: Internal Medicine

## 2019-04-23 ENCOUNTER — Ambulatory Visit: Payer: Medicare Other | Admitting: Cardiovascular Disease

## 2019-06-10 ENCOUNTER — Encounter: Payer: Self-pay | Admitting: General Practice

## 2019-06-13 ENCOUNTER — Other Ambulatory Visit: Payer: Self-pay | Admitting: Internal Medicine

## 2019-06-21 ENCOUNTER — Other Ambulatory Visit: Payer: Self-pay | Admitting: Internal Medicine

## 2019-08-06 ENCOUNTER — Other Ambulatory Visit: Payer: Self-pay | Admitting: Internal Medicine

## 2019-08-07 ENCOUNTER — Ambulatory Visit (INDEPENDENT_AMBULATORY_CARE_PROVIDER_SITE_OTHER): Payer: Medicare Other | Admitting: Internal Medicine

## 2019-08-07 ENCOUNTER — Encounter: Payer: Self-pay | Admitting: Internal Medicine

## 2019-08-07 ENCOUNTER — Other Ambulatory Visit: Payer: Self-pay

## 2019-08-07 VITALS — BP 154/82 | HR 68 | Temp 98.0°F | Ht 71.0 in | Wt 184.4 lb

## 2019-08-07 DIAGNOSIS — E785 Hyperlipidemia, unspecified: Secondary | ICD-10-CM

## 2019-08-07 DIAGNOSIS — J449 Chronic obstructive pulmonary disease, unspecified: Secondary | ICD-10-CM

## 2019-08-07 DIAGNOSIS — I10 Essential (primary) hypertension: Secondary | ICD-10-CM

## 2019-08-07 DIAGNOSIS — E119 Type 2 diabetes mellitus without complications: Secondary | ICD-10-CM | POA: Diagnosis not present

## 2019-08-07 DIAGNOSIS — E611 Iron deficiency: Secondary | ICD-10-CM

## 2019-08-07 DIAGNOSIS — R252 Cramp and spasm: Secondary | ICD-10-CM | POA: Diagnosis not present

## 2019-08-07 DIAGNOSIS — R040 Epistaxis: Secondary | ICD-10-CM | POA: Diagnosis not present

## 2019-08-07 DIAGNOSIS — E559 Vitamin D deficiency, unspecified: Secondary | ICD-10-CM | POA: Diagnosis not present

## 2019-08-07 DIAGNOSIS — E538 Deficiency of other specified B group vitamins: Secondary | ICD-10-CM

## 2019-08-07 DIAGNOSIS — Z0001 Encounter for general adult medical examination with abnormal findings: Secondary | ICD-10-CM | POA: Diagnosis not present

## 2019-08-07 LAB — CBC WITH DIFFERENTIAL/PLATELET
Basophils Absolute: 0 10*3/uL (ref 0.0–0.1)
Basophils Relative: 0.3 % (ref 0.0–3.0)
Eosinophils Absolute: 0.3 10*3/uL (ref 0.0–0.7)
Eosinophils Relative: 6 % — ABNORMAL HIGH (ref 0.0–5.0)
HCT: 45.2 % (ref 39.0–52.0)
Hemoglobin: 14.9 g/dL (ref 13.0–17.0)
Lymphocytes Relative: 41.5 % (ref 12.0–46.0)
Lymphs Abs: 2 10*3/uL (ref 0.7–4.0)
MCHC: 33 g/dL (ref 30.0–36.0)
MCV: 103.5 fl — ABNORMAL HIGH (ref 78.0–100.0)
Monocytes Absolute: 0.4 10*3/uL (ref 0.1–1.0)
Monocytes Relative: 8.8 % (ref 3.0–12.0)
Neutro Abs: 2.1 10*3/uL (ref 1.4–7.7)
Neutrophils Relative %: 43.4 % (ref 43.0–77.0)
Platelets: 221 10*3/uL (ref 150.0–400.0)
RBC: 4.37 Mil/uL (ref 4.22–5.81)
RDW: 15.2 % (ref 11.5–15.5)
WBC: 4.8 10*3/uL (ref 4.0–10.5)

## 2019-08-07 LAB — LIPID PANEL
Cholesterol: 105 mg/dL (ref 0–200)
HDL: 48.4 mg/dL (ref >39.00)
LDL Cholesterol: 42 mg/dL (ref 0–99)
NonHDL: 56.27
Total CHOL/HDL Ratio: 2
Triglycerides: 73 mg/dL (ref 0.0–149.0)
VLDL: 14.6 mg/dL (ref 0.0–40.0)

## 2019-08-07 LAB — HEPATIC FUNCTION PANEL
ALT: 20 U/L (ref 0–53)
AST: 34 U/L (ref 0–37)
Albumin: 4 g/dL (ref 3.5–5.2)
Alkaline Phosphatase: 83 U/L (ref 39–117)
Bilirubin, Direct: 0.3 mg/dL (ref 0.0–0.3)
Total Bilirubin: 1.3 mg/dL — ABNORMAL HIGH (ref 0.2–1.2)
Total Protein: 7 g/dL (ref 6.0–8.3)

## 2019-08-07 LAB — MICROALBUMIN / CREATININE URINE RATIO
Creatinine,U: 110.6 mg/dL
Microalb Creat Ratio: 3.9 mg/g (ref 0.0–30.0)
Microalb, Ur: 4.4 mg/dL — ABNORMAL HIGH (ref 0.0–1.9)

## 2019-08-07 LAB — IBC PANEL
Iron: 132 ug/dL (ref 42–165)
Saturation Ratios: 34.3 % (ref 20.0–50.0)
Transferrin: 275 mg/dL (ref 212.0–360.0)

## 2019-08-07 LAB — BASIC METABOLIC PANEL
BUN: 5 mg/dL — ABNORMAL LOW (ref 6–23)
CO2: 30 mEq/L (ref 19–32)
Calcium: 9.6 mg/dL (ref 8.4–10.5)
Chloride: 104 mEq/L (ref 96–112)
Creatinine, Ser: 0.82 mg/dL (ref 0.40–1.50)
GFR: 110.06 mL/min (ref >60.00)
Glucose, Bld: 105 mg/dL — ABNORMAL HIGH (ref 70–99)
Potassium: 4.2 mEq/L (ref 3.5–5.1)
Sodium: 139 mEq/L (ref 135–145)

## 2019-08-07 LAB — URINALYSIS, ROUTINE W REFLEX MICROSCOPIC
Bilirubin Urine: NEGATIVE
Hgb urine dipstick: NEGATIVE
Ketones, ur: NEGATIVE
Leukocytes,Ua: NEGATIVE
Nitrite: NEGATIVE
RBC / HPF: NONE SEEN (ref 0–?)
Specific Gravity, Urine: 1.02 (ref 1.000–1.030)
Total Protein, Urine: NEGATIVE
Urine Glucose: NEGATIVE
Urobilinogen, UA: 0.2 (ref 0.0–1.0)
pH: 8 (ref 5.0–8.0)

## 2019-08-07 LAB — PSA: PSA: 0.52 ng/mL (ref 0.10–4.00)

## 2019-08-07 LAB — VITAMIN D 25 HYDROXY (VIT D DEFICIENCY, FRACTURES): VITD: <7 ng/mL — ABNORMAL LOW (ref 30.00–100.00)

## 2019-08-07 LAB — TSH: TSH: 0.99 u[IU]/mL (ref 0.35–4.50)

## 2019-08-07 LAB — VITAMIN B12: Vitamin B-12: 76 pg/mL — ABNORMAL LOW (ref 211–911)

## 2019-08-07 MED ORDER — LOSARTAN POTASSIUM 100 MG PO TABS: 100.0000 mg | ORAL_TABLET | Freq: Every day | ORAL | 3 refills | Status: DC

## 2019-08-07 MED ORDER — ATORVASTATIN CALCIUM 20 MG PO TABS: 20.0000 mg | ORAL_TABLET | Freq: Every day | ORAL | 3 refills | Status: DC

## 2019-08-07 MED ORDER — AMLODIPINE BESYLATE 2.5 MG PO TABS: 2.5000 mg | ORAL_TABLET | Freq: Every day | ORAL | 3 refills | Status: DC

## 2019-08-07 MED ORDER — TIZANIDINE HCL 2 MG PO CAPS: 2.0000 mg | ORAL_CAPSULE | Freq: Every evening | ORAL | 5 refills | Status: DC | PRN

## 2019-08-07 NOTE — Patient Instructions (Addendum)
Please remember to call for your yearly eye doctor exam  You will be contacted regarding the referral for: ENT for the nosebleeds  Please take all new medication as prescribed - the muscle relaxer as needed for the leg cramps  Please continue all other medications as before, and refills have been done if requested - the ones for Blood Pressure and cholesterol  Please have the pharmacy call with any other refills you may need.  Please continue your efforts at being more active, low cholesterol diet, and weight control.  You are otherwise up to date with prevention measures today.  Please keep your appointments with your specialists as you may have planned  Please go to the LAB at the blood drawing area for the tests to be done  You will be contacted by phone if any changes need to be made immediately.  Otherwise, you will receive a letter about your results with an explanation, but please check with MyChart first.  Please remember to sign up for MyChart if you have not done so, as this will be important to you in the future with finding out test results, communicating by private email, and scheduling acute appointments online when needed.  Please make an Appointment to return in 2 months, or sooner if needed

## 2019-08-07 NOTE — Assessment & Plan Note (Signed)

## 2019-08-07 NOTE — Assessment & Plan Note (Addendum)
Etiology unclear, for ENT referral  - may need cautery  I spent 31 minutes in addition to time for wellness examination in preparing to see the patient by review of recent labs, imaging and procedures, obtaining and reviewing separately obtained history, communicating with the patient and family or caregiver, ordering medications, tests or procedures, and documenting clinical information in the EHR including the differential Dx, treatment, and any further evaluation and other management of right sided nosebleed, leg cramping, COPD, HLD, HTn, DM

## 2019-08-07 NOTE — Assessment & Plan Note (Signed)
Uncontrolled, to restart meds, .o/w stable overall by history and exam, recent data reviewed with pt, and pt to continue medical treatment as before,  to f/u any worsening symptoms or concerns

## 2019-08-07 NOTE — Progress Notes (Signed)
Subjective:    Patient ID: Andre [name], male    DOB: 1941/03/24, 79 y.o.   MRN: [id number]  HPI   Here for wellness and f/u;  Overall doing ok;  Pt denies Chest pain, worsening SOB, DOE, wheezing, orthopnea, PND, worsening LE edema, palpitations, dizziness or syncope.  Pt denies neurological change such as new headache, facial or extremity weakness.  Pt denies polydipsia, polyuria, or low sugar symptoms. Pt states overall good compliance with treatment and medications, good tolerability, and has been trying to follow appropriate diet.  Pt denies worsening depressive symptoms, suicidal ideation or panic. No fever, night sweats, wt loss, loss of appetite, or other constitutional symptoms.  Pt states good ability with ADL's, has low fall risk, home safety reviewed and adequate, no other significant changes in hearing or vision, and only occasionally active with exercise. Plans to call for optho appt soon.  Also with c/o 2 episodes in 3 wks of right sided nosebleed with some residual right nasal discomfort.  No sinus pain or congestion. Also with nightly most nights left > right calf cramps for unclear reasons. Past Medical History:  Diagnosis Date  . Arthritis   . Coronary artery disease, non-occlusive    a. 06/2011: cath for abnormal exercise echo: Only 30-40% proximal RCA. Otherwise normal. b. 10/2015: cath for abnormal NST and atypical CP --> showed mild 30-40% stenosis in the prox-distal RCA  . GERD (gastroesophageal reflux disease)   . HA (headache)   . Hypertension   . 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[place])   . TIA (transient ischemic attack)   . Weakness    Past Surgical History:  Procedure Laterality Date  . CARDIAC CATHETERIZATION  January 2013   Proximal RCA 30-40%. Otherwise normal.  . CARDIAC CATHETERIZATION N/A 10/18/2015   Procedure: Left Heart Cath and Coronary Angiography;  Surgeon: Jettie Booze, MD;  Location: [facility];  Service: Cardiovascular;  Laterality: N/A;  . NM MYOVIEW LTD   March 2015   LOW RISK. No scar or ischemia. Normal EF (55%). No RWMA  . surgical excision of rectal cancer      reports that he has been smoking cigarettes. He has never used smokeless tobacco. He reports current alcohol use of about 2.0 standard drinks of alcohol per week. He reports that he does not use drugs. family history includes Cancer in his mother; Hypertension in his mother; Liver disease in his father. No Known Allergies Current Outpatient Medications on File Prior to Visit  Medication Sig Dispense Refill  . acetaminophen (TYLENOL) 500 MG tablet Take 500 mg by mouth every 6 (six) hours as needed.    Marland Kitchen aspirin EC 81 MG tablet Take 1 tablet (81 mg total) by mouth daily.    . naproxen sodium (ALEVE) 220 MG tablet Take 220 mg by mouth as needed (headache).    Marland Kitchen amLODipine (NORVASC) 2.5 MG tablet TAKE 1 TABLET(2.5 MG) BY MOUTH DAILY 90 tablet 3  . losartan (COZAAR) 100 MG tablet TAKE 1 TABLET(100 MG) BY MOUTH DAILY 90 tablet 3  . [DISCONTINUED] chlorthalidone (HYGROTON) 25 MG tablet Take 25 mg by mouth daily.     No current facility-administered medications on file prior to visit.   Review of Systems All otherwise neg per pt     Objective:   Physical Exam BP (!) 154/82   Pulse 68   Temp 98 F (36.7 C)   Ht 5\' 11"  (1.803 m)   Wt 184 lb 6.4 oz (83.6 kg)   SpO2  99%   BMI 25.72 kg/m  VS noted,  Constitutional: Pt appears in NAD HENT: Head: NCAT.  Right Ear: External ear normal.  Left Ear: External ear normal.  Eyes: . Pupils are equal, round, and reactive to light. Conjunctivae and EOM are normal Nose: without d/c or deformity Neck: Neck supple. Gross normal ROM Cardiovascular: Normal rate and regular rhythm.   Pulmonary/Chest: Effort normal and breath sounds without rales or wheezing.  Abd:  Soft, NT, ND, + BS, no organomegaly Neurological: Pt is alert. At baseline orientation, motor grossly intact Skin: Skin is warm. No rashes, other new lesions, no LE  edema Psychiatric: Pt behavior is normal without agitation  All otherwise neg per pt Lab Results  Component Value Date   WBC 6.7 03/20/2019   HGB 13.9 03/20/2019   HCT 41.9 03/20/2019   PLT 199 03/20/2019   GLUCOSE 144 (H) 03/20/2019   CHOL 158 06/26/2018   TRIG 124.0 06/26/2018   HDL 33.80 (L) 06/26/2018   LDLCALC 99 06/26/2018   ALT 22 06/26/2018   AST 41 (H) 06/26/2018   NA 139 03/20/2019   K 3.2 (L) 03/20/2019   CL 104 03/20/2019   CREATININE 1.02 03/20/2019   BUN 5 (L) 03/20/2019   CO2 23 03/20/2019   TSH 0.950 11/22/2016   INR 1.09 10/18/2015   HGBA1C 7.0 (H) 06/26/2018   MICROALBUR 4.4 (H) 08/07/2019      Assessment & Plan:

## 2019-08-07 NOTE — Assessment & Plan Note (Signed)
For muscle relaxer prn,  to f/u any worsening symptoms or concerns

## 2019-08-07 NOTE — Assessment & Plan Note (Signed)
stable overall by history and exam, recent data reviewed with pt, and pt to continue medical treatment as before,  to f/u any worsening symptoms or concerns - to restart med

## 2019-08-07 NOTE — Assessment & Plan Note (Signed)
stable overall by history and exam, recent data reviewed with pt, and pt to continue medical treatment as before,  to f/u any worsening symptoms or concerns  

## 2019-08-08 LAB — HEMOGLOBIN A1C: Hgb A1c MFr Bld: 6.1 % (ref 4.6–6.5)

## 2019-08-09 ENCOUNTER — Other Ambulatory Visit: Payer: Self-pay | Admitting: Internal Medicine

## 2019-08-09 MED ORDER — VITAMIN D (ERGOCALCIFEROL) 1.25 MG (50000 UNIT) PO CAPS: 50000.0000 [IU] | ORAL_CAPSULE | ORAL | 0 refills | Status: DC

## 2019-08-09 MED ORDER — VITAMIN B-12 1000 MCG PO TABS: 1000.0000 ug | ORAL_TABLET | Freq: Every day | ORAL | 3 refills | Status: DC

## 2019-08-12 ENCOUNTER — Telehealth: Payer: Self-pay | Admitting: Internal Medicine

## 2019-08-12 DIAGNOSIS — R519 Headache, unspecified: Secondary | ICD-10-CM

## 2019-08-12 NOTE — Telephone Encounter (Signed)
Ok referral done 

## 2019-08-12 NOTE — Telephone Encounter (Signed)
-----   Message from [facility], [place] sent at 08/12/2019 10:41 AM EST ----- Pt informed of results. Pt is requesting a referral to see someone about his headache.

## 2019-08-14 ENCOUNTER — Ambulatory Visit: Payer: Medicare Other | Attending: Internal Medicine

## 2019-08-14 DIAGNOSIS — Z23 Encounter for immunization: Secondary | ICD-10-CM | POA: Insufficient documentation

## 2019-08-14 NOTE — Progress Notes (Signed)
   Covid-19 Vaccination Clinic  Name:  Andre [name]    MRN: [id number] DOB: 1940-11-20  08/14/2019  [name] was observed post Covid-19 immunization for 15 minutes without incident. He was provided with Vaccine Information Sheet and instruction to access the V-Safe system.   [name] was instructed to call 911 with any severe reactions post vaccine: Marland Kitchen Difficulty breathing  . Swelling of face and throat  . A fast heartbeat  . A bad rash all over body  . Dizziness and weakness   Immunizations Administered    Name Date Dose VIS Date Route   Pfizer COVID-19 Vaccine 08/14/2019  9:52 AM 0.3 mL 05/23/2019 Intramuscular   Manufacturer: [employer]   Lot: [id number]   [place]rena: [id number]

## 2019-08-15 ENCOUNTER — Encounter: Payer: Self-pay | Admitting: Neurology

## 2019-08-20 ENCOUNTER — Other Ambulatory Visit: Payer: Self-pay

## 2019-08-20 ENCOUNTER — Ambulatory Visit (INDEPENDENT_AMBULATORY_CARE_PROVIDER_SITE_OTHER): Payer: Medicare Other | Admitting: Otolaryngology

## 2019-08-20 VITALS — Temp 98.2°F

## 2019-08-20 DIAGNOSIS — J310 Chronic rhinitis: Secondary | ICD-10-CM | POA: Diagnosis not present

## 2019-08-20 DIAGNOSIS — R040 Epistaxis: Secondary | ICD-10-CM | POA: Diagnosis not present

## 2019-08-20 DIAGNOSIS — R519 Headache, unspecified: Secondary | ICD-10-CM

## 2019-08-20 NOTE — Progress Notes (Signed)
HPI: Andre [name] is a 79 y.o. male who presents is referred by Dr. [name] for evaluation of right-sided nosebleeds and sinus complaints.  He has intermittent nasal congestion but this does not seem to bother him that much.  He had 2 episodes of nosebleeds on the right side that occurred at night the last time about 10 days ago.  It apparently stopped spontaneously.  He has had no further bleeding this past week.  He had a another nosebleed about 10 days prior to the first one that also stopped spontaneously and was also from the right side.Marland Kitchen  He denies any yellow-green discharge from his nose.  He does not complain of any trouble breathing presently. I reviewed a CT scan that was performed about 2 years ago because of headaches and this showed clear paranasal sinuses with clear frontal sinuses bilaterally.  Past Medical History:  Diagnosis Date  . Arthritis   . Coronary artery disease, non-occlusive    a. 06/2011: cath for abnormal exercise echo: Only 30-40% proximal RCA. Otherwise normal. b. 10/2015: cath for abnormal NST and atypical CP --> showed mild 30-40% stenosis in the prox-distal RCA  . GERD (gastroesophageal reflux disease)   . HA (headache)   . Hypertension   . 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[place])   . TIA (transient ischemic attack)   . Weakness    Past Surgical History:  Procedure Laterality Date  . CARDIAC CATHETERIZATION  January 2013   Proximal RCA 30-40%. Otherwise normal.  . CARDIAC CATHETERIZATION N/A 10/18/2015   Procedure: Left Heart Cath and Coronary Angiography;  Surgeon: Jettie Booze, MD;  Location: [facility];  Service: Cardiovascular;  Laterality: N/A;  . NM MYOVIEW LTD  March 2015   LOW RISK. No scar or ischemia. Normal EF (55%). No RWMA  . surgical excision of rectal cancer     Social History   Socioeconomic History  . Marital status: Single    Spouse name: Not on file  . Number of children: 11  . Years of education: 80  . Highest education level: Not on file   Occupational History  . Occupation: Retried    Comment: retired  Tobacco Use  . Smoking status: Current Some Day Smoker    Types: Cigarettes  . Smokeless tobacco: Never Used  Substance and Sexual Activity  . Alcohol use: Yes    Alcohol/week: 2.0 standard drinks    Types: 2 Cans of beer per week  . Drug use: No  . Sexual activity: Not on file  Other Topics Concern  . Not on file  Social History Narrative   Patient is single, retired   Patient is right handed   Education level is high school   Caffeine consumption is 1 cup daily   Social Determinants of Radio broadcast assistant Strain:   . Difficulty of Paying Living Expenses: Not on file  Food Insecurity:   . Worried About Charity fundraiser in the Last Year: Not on file  . Ran Out of Food in the Last Year: Not on file  Transportation Needs:   . Lack of Transportation (Medical): Not on file  . Lack of Transportation (Non-Medical): Not on file  Physical Activity:   . Days of Exercise per Week: Not on file  . Minutes of Exercise per Session: Not on file  Stress:   . Feeling of Stress : Not on file  Social Connections:   . Frequency of Communication with Friends and Family: Not on file  .  Frequency of Social Gatherings with Friends and Family: Not on file  . Attends Religious Services: Not on file  . Active Member of Clubs or Organizations: Not on file  . Attends Archivist Meetings: Not on file  . Marital Status: Not on file   Family History  Problem Relation Age of Onset  . Hypertension Mother   . Cancer Mother   . Liver disease Father    No Known Allergies Prior to Admission medications   Medication Sig Start Date End Date Taking? Authorizing Provider  acetaminophen (TYLENOL) 500 MG tablet Take 500 mg by mouth every 6 (six) hours as needed.   Yes [provider]  amLODipine (NORVASC) 2.5 MG tablet TAKE 1 TABLET(2.5 MG) BY MOUTH DAILY 08/07/19  Yes Biagio Borg, MD  amLODipine (NORVASC)  2.5 MG tablet Take 1 tablet (2.5 mg total) by mouth daily. 08/07/19  Yes Biagio Borg, MD  aspirin EC 81 MG tablet Take 1 tablet (81 mg total) by mouth daily. 03/24/19  Yes Burns, Claudina Lick, MD  atorvastatin (LIPITOR) 20 MG tablet Take 1 tablet (20 mg total) by mouth daily. 08/07/19  Yes Biagio Borg, MD  losartan (COZAAR) 100 MG tablet TAKE 1 TABLET(100 MG) BY MOUTH DAILY 08/07/19  Yes Biagio Borg, MD  losartan (COZAAR) 100 MG tablet Take 1 tablet (100 mg total) by mouth daily. 08/07/19  Yes Biagio Borg, MD  naproxen sodium (ALEVE) 220 MG tablet Take 220 mg by mouth as needed (headache).   Yes [provider]  tizanidine (ZANAFLEX) 2 MG capsule Take 1 capsule (2 mg total) by mouth at bedtime as needed for muscle spasms. 08/07/19  Yes Biagio Borg, MD  vitamin B-12 (CYANOCOBALAMIN) 1000 MCG tablet Take 1 tablet (1,000 mcg total) by mouth daily. 08/09/19  Yes Biagio Borg, MD  Vitamin D, Ergocalciferol, (DRISDOL) 1.25 MG (50000 UNIT) CAPS capsule Take 1 capsule (50,000 Units total) by mouth every 7 (seven) days. 08/09/19  Yes Biagio Borg, MD  chlorthalidone (HYGROTON) 25 MG tablet Take 25 mg by mouth daily.  08/30/11  [provider]     Positive ROS: Otherwise negative  All other systems have been reviewed and were otherwise negative with the exception of those mentioned in the HPI and as above.  Physical Exam: Constitutional: Alert, well-appearing, no acute distress Ears: External ears without lesions or tenderness. Ear canals are clear bilaterally with intact, clear TMs.  Nasal: External nose without lesions. Septum slightly deviated to the right superiorly.  Has a septal spur more posteriorly on the left side..  No polyps or intranasal masses noted on anterior rhinoscopy. Nasal endoscopy was performed in the office today and on nasal endoscopy he had mild rhinitis but both middle meatus regions were clear with no evidence of polyps masses or mucopurulent discharge.   Nasopharynx was clear. Oral: Lips and gums without lesions. Tongue and palate mucosa without lesions. Posterior oropharynx clear. Neck: No palpable adenopathy or masses Respiratory: Breathing comfortably  Skin: No facial/neck lesions or rash noted.  Nasal/sinus endoscopy  Date/Time: 08/20/2019 12:54 PM Performed by: Rozetta Nunnery, MD Authorized by: Rozetta Nunnery, MD   Consent:    Consent obtained:  Verbal   Consent given by:  Patient   Risks discussed:  Pain Procedure details:    Indications: sino-nasal symptoms     Medication:  Afrin   Instrument: flexible fiberoptic nasal endoscope     Scope location: bilateral nare   Sinus:  Right middle meatus: normal     Left middle meatus: normal     Right nasopharynx: normal     Left nasopharynx: normal   Comments:     On nasal endoscopy there is presently no evidence of active sinus infection.  I cannot identify site or source of recent epistaxis.  He does have moderate rhinitis with mucosal swelling but only clear mucus discharge.    Assessment: Chronic rhinitis Recent right epistaxis which was minor and stopped spontaneously.  I cannot determine etiology of this. Headaches questionable etiology.  I do not feel like these are sinus related.  Plan: Discussed with patient concerning use of Flonase or Nasacort if he is having much nasal congestion but he really does not describe nasal congestion today. He will follow up here as needed any further epistaxis. Concerning his headaches which he is most concerned about would recommend further evaluation with headache specialist or neurology.   Radene Journey, MD   CC:

## 2019-09-10 ENCOUNTER — Ambulatory Visit: Payer: Medicare Other

## 2019-09-10 ENCOUNTER — Encounter: Payer: Self-pay | Admitting: Internal Medicine

## 2019-09-10 ENCOUNTER — Ambulatory Visit (INDEPENDENT_AMBULATORY_CARE_PROVIDER_SITE_OTHER): Payer: Medicare Other | Admitting: Internal Medicine

## 2019-09-10 ENCOUNTER — Other Ambulatory Visit: Payer: Self-pay

## 2019-09-10 VITALS — BP 162/80 | HR 62 | Temp 97.4°F | Resp 16 | Ht 71.0 in | Wt 185.0 lb

## 2019-09-10 DIAGNOSIS — R413 Other amnesia: Secondary | ICD-10-CM

## 2019-09-10 DIAGNOSIS — R519 Headache, unspecified: Secondary | ICD-10-CM

## 2019-09-10 DIAGNOSIS — K2100 Gastro-esophageal reflux disease with esophagitis, without bleeding: Secondary | ICD-10-CM | POA: Diagnosis not present

## 2019-09-10 MED ORDER — HYDROCHLOROTHIAZIDE 25 MG PO TABS: 25.0000 mg | ORAL_TABLET | Freq: Every day | ORAL | 5 refills | Status: DC

## 2019-09-10 MED ORDER — OMEPRAZOLE 40 MG PO CPDR: 40.0000 mg | DELAYED_RELEASE_CAPSULE | Freq: Every day | ORAL | 5 refills | Status: DC

## 2019-09-10 NOTE — Patient Instructions (Addendum)
A Ct scan of your head was ordered.    Medications reviewed and updated.  Changes include :     Hydrochlorothiazide for your blood pressure Omeprazole daily for your heartburn  Your prescription(s) have been submitted to your pharmacy. Please take as directed and contact our office if you believe you are having problem(s) with the medication(s).  A referral was ordered for Neurology.      Someone will call you to schedule this.     Follow up as scheduled with Dr [name] next month

## 2019-09-10 NOTE — Assessment & Plan Note (Addendum)
Have burning chest pain - GERD Start omeprazole 40 mg daily.   Avoid foods that make it worse, avoid nsaids ( advil, goody powders) Return in no improvement-has a follow-up with his PCP next month

## 2019-09-10 NOTE — Assessment & Plan Note (Signed)
Acute on chronic-has been having headaches for a while, but states they are getting worse No recent imaging-ordered Refer to neurology Advised not to take at all, ibuprofen and Goody powders on a regular basis

## 2019-09-10 NOTE — Assessment & Plan Note (Signed)
Acute, episode of memory loss Possibly related to alcohol intake-looks like he had issues with alcohol in the past He is very vague about how much he is drinking now, but admits that he did have some alcohol that night CT of the head Refer to neurology

## 2019-09-10 NOTE — Progress Notes (Signed)
Subjective:    Patient ID: Andre [name], male    DOB: May 30, 1941, 79 y.o.   MRN: [id number]  HPI The patient is here for an acute visit.   Headache: Initially he stated that his headache started 5 days, but with further questioning it started more than a year ago, but is getting worse.  He states for the past 5 days it has been intractable.  He sometimes takes Advil, Tylenol or Goody powders for this.  The headache is located in the front of his head on the right.  He is concerned because he had a recent episode of memory loss.  This occurred 5 days ago.  He went to a party that night and he remembers sitting around with a few of his friends.  He did drink some alcohol last night.  The next thing he remembers was 730 the next morning [place].  The people who is yard he was then called the police and they took him downtown.  They did do a breathalyzer and for him he was not drunk.  It sounds like they took his car from him and he will need paperwork for the [facility].  He cannot give me any other information on that.  He denies any other episodes of memory loss like this.  He still does not remember anything about that night or how he got to the country.    Chest pain:  It started > 1 month ago.  The pain is from his sternum to his stomach and is a burning or acid feeling.  It is worse with certain foods - spaghetti and soup recently.   He takes advil or goody powder as needed.  He takes them for his headaches.   He does have a history of GERD, but has not been on any medication for that recently.    Medications and allergies reviewed with patient and updated if appropriate.  Patient Active Problem List   Diagnosis Date Noted  . Right-sided nosebleed 08/07/2019  . Encounter for well adult exam with abnormal findings 08/07/2019  . Leg cramping 08/07/2019  . Chest pain 03/23/2019  . Epistaxis 03/23/2019  . Hyperlipidemia 12/24/2017  . Alcohol use  12/24/2017  . Non-compliance 12/24/2017  . 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 Left lumbar radiculopathy 08/02/2017  . Atherosclerosis 06/03/2017  . Low-level of literacy 05/12/2017  . Osteoarthritis 04/20/2017  . Diabetes ([place]) 03/12/2017  . Back pain 02/29/2016  . Abnormal stress test 10/18/2015  . Erectile dysfunction 02/25/2015  . Cigarette smoker 02/10/2015  . COPD GOLD 0 11/08/2014  . Hemorrhoids 02/11/2014  . 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 Headache 10/23/2011  . 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[place]) 10/23/2011  . GERD (gastroesophageal reflux disease) 10/23/2011  . Nicotine addiction 10/23/2011  . 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 Cervical stenosis of spinal canal 10/23/2011  . Essential hypertension, benign 07/10/2011    Current Outpatient Medications on File Prior to Visit  Medication Sig Dispense Refill  . acetaminophen (TYLENOL) 500 MG tablet Take 500 mg by mouth every 6 (six) hours as needed.    Marland Kitchen amLODipine (NORVASC) 2.5 MG tablet TAKE 1 TABLET(2.5 MG) BY MOUTH DAILY 90 tablet 3  . amLODipine (NORVASC) 2.5 MG tablet Take 1 tablet (2.5 mg total) by mouth daily. 90 tablet 3  . aspirin EC 81 MG tablet Take 1 tablet (81 mg total) by mouth daily.    Marland Kitchen atorvastatin (  LIPITOR) 20 MG tablet Take 1 tablet (20 mg total) by mouth daily. 90 tablet 3  . losartan (COZAAR) 100 MG tablet TAKE 1 TABLET(100 MG) BY MOUTH DAILY 90 tablet 3  . losartan (COZAAR) 100 MG tablet Take 1 tablet (100 mg total) by mouth daily. 90 tablet 3  . naproxen sodium (ALEVE) 220 MG tablet Take 220 mg by mouth as needed (headache).    . tizanidine (ZANAFLEX) 2 MG capsule Take 1 capsule (2 mg total) by mouth at bedtime as needed for muscle spasms. 30 capsule 5  . vitamin B-12 (CYANOCOBALAMIN) 1000 MCG tablet Take 1 tablet (1,000 mcg total) by mouth daily. 90 tablet 3  . Vitamin D, Ergocalciferol, (DRISDOL) 1.25 MG (50000 UNIT) CAPS capsule Take 1 capsule (50,000 Units total) by mouth every 7 (seven)  days. 12 capsule 0  . [DISCONTINUED] chlorthalidone (HYGROTON) 25 MG tablet Take 25 mg by mouth daily.     No current facility-administered medications on file prior to visit.    Past Medical History:  Diagnosis Date  . Arthritis   . Coronary artery disease, non-occlusive    a. 06/2011: cath for abnormal exercise echo: Only 30-40% proximal RCA. Otherwise normal. b. 10/2015: cath for abnormal NST and atypical CP --> showed mild 30-40% stenosis in the prox-distal RCA  . GERD (gastroesophageal reflux disease)   . HA (headache)   . Hypertension   . 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[place])   . TIA (transient ischemic attack)   . Weakness     Past Surgical History:  Procedure Laterality Date  . CARDIAC CATHETERIZATION  January 2013   Proximal RCA 30-40%. Otherwise normal.  . CARDIAC CATHETERIZATION N/A 10/18/2015   Procedure: Left Heart Cath and Coronary Angiography;  Surgeon: Jettie Booze, MD;  Location: [facility];  Service: Cardiovascular;  Laterality: N/A;  . NM MYOVIEW LTD  March 2015   LOW RISK. No scar or ischemia. Normal EF (55%). No RWMA  . surgical excision of rectal cancer      Social History   Socioeconomic History  . Marital status: Single    Spouse name: Not on file  . Number of children: 11  . Years of education: 35  . Highest education level: Not on file  Occupational History  . Occupation: Retried    Comment: retired  Tobacco Use  . Smoking status: Current Some Day Smoker    Types: Cigarettes  . Smokeless tobacco: Never Used  Substance and Sexual Activity  . Alcohol use: Yes    Alcohol/week: 2.0 standard drinks    Types: 2 Cans of beer per week  . Drug use: No  . Sexual activity: Not on file  Other Topics Concern  . Not on file  Social History Narrative   Patient is single, retired   Patient is right handed   Education level is high school   Caffeine consumption is 1 cup daily   Social Determinants of Radio broadcast assistant Strain:   . Difficulty  of Paying Living Expenses:   Food Insecurity:   . Worried About Charity fundraiser in the Last Year:   . Arboriculturist in the Last Year:   Transportation Needs:   . Film/video editor (Medical):   Marland Kitchen Lack of Transportation (Non-Medical):   Physical Activity:   . Days of Exercise per Week:   . Minutes of Exercise per Session:   Stress:   . Feeling of Stress :   Social Connections:   .  Frequency of Communication with Friends and Family:   . Frequency of Social Gatherings with Friends and Family:   . Attends Religious Services:   . Active Member of Clubs or Organizations:   . Attends Archivist Meetings:   Marland Kitchen Marital Status:     Family History  Problem Relation Age of Onset  . Hypertension Mother   . Cancer Mother   . Liver disease Father     Review of Systems  Constitutional: Negative for fever.  Eyes: Negative for visual disturbance.  Respiratory: Negative for shortness of breath.   Cardiovascular: Positive for chest pain. Negative for palpitations.  Neurological: Positive for dizziness (yesterday a little, occ) and headaches. Negative for weakness, light-headedness and numbness.       Objective:   Vitals:   09/10/19 1048  BP: (!) 162/80  Pulse: 62  Resp: 16  Temp: (!) 97.4 F (36.3 C)  SpO2: 99%   BP Readings from Last 3 Encounters:  09/10/19 (!) 162/80  08/07/19 (!) 154/82  03/24/19 (!) 168/70   Wt Readings from Last 3 Encounters:  09/10/19 185 lb (83.9 kg)  08/07/19 184 lb 6.4 oz (83.6 kg)  03/24/19 186 lb (84.4 kg)   Body mass index is 25.8 kg/m.   Physical Exam Constitutional:      General: He is not in acute distress.    Appearance: Normal appearance. He is not ill-appearing.  HENT:     Head: Normocephalic and atraumatic.  Eyes:     Extraocular Movements: Extraocular movements intact.     Conjunctiva/sclera: Conjunctivae normal.  Cardiovascular:     Rate and Rhythm: Normal rate and regular rhythm.  Pulmonary:     Effort:  Pulmonary effort is normal.     Breath sounds: Normal breath sounds.  Musculoskeletal:     Cervical back: Neck supple. No tenderness.     Right lower leg: Edema (mild) present.     Left lower leg: Edema (mild) present.  Lymphadenopathy:     Cervical: No cervical adenopathy.  Skin:    General: Skin is warm and dry.  Neurological:     Mental Status: He is alert and oriented to person, place, and time.     Cranial Nerves: No cranial nerve deficit.     Sensory: No sensory deficit.     Motor: No weakness.  Psychiatric:        Mood and Affect: Mood normal.            Assessment & Plan:    See Problem List for Assessment and Plan of chronic medical problems.    This visit occurred during the SARS-CoV-2 public health emergency.  Safety protocols were in place, including screening questions prior to the visit, additional usage of staff PPE, and extensive cleaning of exam room while observing appropriate contact time as indicated for disinfecting solutions.

## 2019-09-11 ENCOUNTER — Ambulatory Visit (INDEPENDENT_AMBULATORY_CARE_PROVIDER_SITE_OTHER)
Admission: RE | Admit: 2019-09-11 | Discharge: 2019-09-11 | Disposition: A | Discharge status: Home / Self Care | Payer: Medicare Other | Source: Ambulatory Visit | Attending: Internal Medicine | Admitting: Internal Medicine

## 2019-09-11 ENCOUNTER — Telehealth: Payer: Self-pay

## 2019-09-11 DIAGNOSIS — R413 Other amnesia: Secondary | ICD-10-CM

## 2019-09-11 DIAGNOSIS — R519 Headache, unspecified: Secondary | ICD-10-CM | POA: Diagnosis not present

## 2019-09-11 NOTE — Telephone Encounter (Signed)
Ok this is done 

## 2019-09-11 NOTE — Telephone Encounter (Signed)
There is no test FOR dementia per se -   He has had all usual tests done to look for other causes  I can refer to neurology if they like

## 2019-09-11 NOTE — Telephone Encounter (Signed)
New message    Daughter-in-law Vida Roller calling wanted to know what tests can be performed on her father-in-law to see if he has dementia.   The patient has CT done today @ [place].

## 2019-09-11 NOTE — Telephone Encounter (Signed)
See below

## 2019-09-16 ENCOUNTER — Ambulatory Visit: Payer: Medicare Other | Attending: Internal Medicine

## 2019-09-16 DIAGNOSIS — Z23 Encounter for immunization: Secondary | ICD-10-CM

## 2019-09-16 NOTE — Progress Notes (Signed)
   Covid-19 Vaccination Clinic  Name:  Andre [name]    MRN: [id number] DOB: 1941/05/11  09/16/2019  [name] was observed post Covid-19 immunization for 15 minutes without incident. He was provided with Vaccine Information Sheet and instruction to access the V-Safe system.   [name] was instructed to call 911 with any severe reactions post vaccine: Marland Kitchen Difficulty breathing  . Swelling of face and throat  . A fast heartbeat  . A bad rash all over body  . Dizziness and weakness   Immunizations Administered    Name Date Dose VIS Date Route   Pfizer COVID-19 Vaccine 09/16/2019 12:29 PM 0.3 mL 05/23/2019 Intramuscular   Manufacturer: [place]   Lot: [id number]   [place]: [address]-7

## 2019-09-30 ENCOUNTER — Encounter: Payer: Self-pay | Admitting: Internal Medicine

## 2019-09-30 ENCOUNTER — Ambulatory Visit (INDEPENDENT_AMBULATORY_CARE_PROVIDER_SITE_OTHER): Payer: Medicare Other | Admitting: Internal Medicine

## 2019-09-30 ENCOUNTER — Other Ambulatory Visit: Payer: Self-pay

## 2019-09-30 VITALS — BP 152/78 | HR 84 | Temp 98.0°F | Ht 71.0 in | Wt 186.0 lb

## 2019-09-30 DIAGNOSIS — I10 Essential (primary) hypertension: Secondary | ICD-10-CM | POA: Diagnosis not present

## 2019-09-30 DIAGNOSIS — I2510 Atherosclerotic heart disease of native coronary artery without angina pectoris: Secondary | ICD-10-CM | POA: Diagnosis not present

## 2019-09-30 DIAGNOSIS — R519 Headache, unspecified: Secondary | ICD-10-CM

## 2019-09-30 DIAGNOSIS — I2583 Coronary atherosclerosis due to lipid rich plaque: Secondary | ICD-10-CM | POA: Diagnosis not present

## 2019-09-30 DIAGNOSIS — E119 Type 2 diabetes mellitus without complications: Secondary | ICD-10-CM

## 2019-09-30 DIAGNOSIS — R079 Chest pain, unspecified: Secondary | ICD-10-CM | POA: Diagnosis not present

## 2019-09-30 NOTE — Assessment & Plan Note (Signed)
Non obstructive per 2017 cath, stable overall by history and exam, recent data reviewed with pt, and pt to continue medical treatment as before,  to f/u any worsening symptoms or concerns

## 2019-09-30 NOTE — Assessment & Plan Note (Addendum)
ecg reviewed, atypical, etiology unclear, for stress testing  I spent32 minutes in preparing to see the patient by review of recent labs, imaging and procedures, obtaining and reviewing separately obtained history, communicating with the patient and family or caregiver, ordering medications, tests or procedures, and documenting clinical information in the EHR including the differential Dx, treatment, and any further evaluation and other management of cp, ha, Dm, hTN

## 2019-09-30 NOTE — Assessment & Plan Note (Signed)
stable overall by history and exam, recent data reviewed with pt, and pt to continue medical treatment as before,  to f/u any worsening symptoms or concerns  

## 2019-09-30 NOTE — Patient Instructions (Signed)
Your EKG was OK today  You will be contacted regarding the referral for: stress test for the heart, and MRI for the brain  Please continue all other medications as before, and refills have been done if requested.  Please have the pharmacy call with any other refills you may need.  Please continue your efforts at being more active, low cholesterol diet, and weight control.  Please keep your appointments with your specialists as you may have planned

## 2019-09-30 NOTE — Assessment & Plan Note (Signed)
Atypical, etiology unclear, exam benign, for MRI brain,  to f/u any worsening symptoms or concerns

## 2019-09-30 NOTE — Progress Notes (Signed)
Subjective:    Patient ID: Andre [name], male    DOB: 1940-06-13, 79 y.o.   MRN: [id number]  HPI   Here with mid and left CP x 1 mo, intermittent, mild to mod, pressure, no radiation or sob, n/v, palps, diaphoresis, or dizziness.  Also with recurring HAs unusual for him, with episode of amnesia about 1 mo ago. None since except for the headaches.  Last cath with non obstructive CAD 2017, last cxr neg oct 2020.  Recent Head CT neg for acute apr 1.  Pt denies other chest pain, increased sob or doe, wheezing, orthopnea, PND, increased LE swelling.  Pt denies new neurological symptoms such as new facial or extremity weakness or numbness    Pt denies fever, wt loss, night sweats, loss of appetite, or other constitutional symptoms  Denies worsening depressive symptoms, suicidal ideation, or panic; has ongoing anxiety, not increased recently.  Past Medical History:  Diagnosis Date  . Arthritis   . Coronary artery disease, non-occlusive    a. 06/2011: cath for abnormal exercise echo: Only 30-40% proximal RCA. Otherwise normal. b. 10/2015: cath for abnormal NST and atypical CP --> showed mild 30-40% stenosis in the prox-distal RCA  . GERD (gastroesophageal reflux disease)   . HA (headache)   . Hypertension   . 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[place])   . TIA (transient ischemic attack)   . Weakness    Past Surgical History:  Procedure Laterality Date  . CARDIAC CATHETERIZATION  January 2013   Proximal RCA 30-40%. Otherwise normal.  . CARDIAC CATHETERIZATION N/A 10/18/2015   Procedure: Left Heart Cath and Coronary Angiography;  Surgeon: Jettie Booze, MD;  Location: [facility];  Service: Cardiovascular;  Laterality: N/A;  . NM MYOVIEW LTD  March 2015   LOW RISK. No scar or ischemia. Normal EF (55%). No RWMA  . surgical excision of rectal cancer      reports that he has been smoking cigarettes. He has never used smokeless tobacco. He reports current alcohol use of about 2.0 standard drinks of alcohol per  week. He reports that he does not use drugs. family history includes Cancer in his mother; Hypertension in his mother; Liver disease in his father. No Known Allergies Current Outpatient Medications on File Prior to Visit  Medication Sig Dispense Refill  . acetaminophen (TYLENOL) 500 MG tablet Take 500 mg by mouth every 6 (six) hours as needed.    Marland Kitchen amLODipine (NORVASC) 2.5 MG tablet Take 1 tablet (2.5 mg total) by mouth daily. 90 tablet 3  . aspirin EC 81 MG tablet Take 1 tablet (81 mg total) by mouth daily.    Marland Kitchen atorvastatin (LIPITOR) 20 MG tablet Take 1 tablet (20 mg total) by mouth daily. 90 tablet 3  . hydrochlorothiazide (HYDRODIURIL) 25 MG tablet Take 1 tablet (25 mg total) by mouth daily. 30 tablet 5  . losartan (COZAAR) 100 MG tablet Take 1 tablet (100 mg total) by mouth daily. 90 tablet 3  . omeprazole (PRILOSEC) 40 MG capsule Take 1 capsule (40 mg total) by mouth daily. 30 capsule 5  . tizanidine (ZANAFLEX) 2 MG capsule Take 1 capsule (2 mg total) by mouth at bedtime as needed for muscle spasms. 30 capsule 5  . vitamin B-12 (CYANOCOBALAMIN) 1000 MCG tablet Take 1 tablet (1,000 mcg total) by mouth daily. 90 tablet 3  . Vitamin D, Ergocalciferol, (DRISDOL) 1.25 MG (50000 UNIT) CAPS capsule Take 1 capsule (50,000 Units total) by mouth every 7 (seven) days. 12 capsule  0  . [DISCONTINUED] chlorthalidone (HYGROTON) 25 MG tablet Take 25 mg by mouth daily.     No current facility-administered medications on file prior to visit.   Review of Systems All otherwise neg per pt    Objective:   Physical Exam BP (!) 152/78 (BP Location: Left Arm, Patient Position: Sitting, Cuff Size: Large)   Pulse 84   Temp 98 F (36.7 C) (Oral)   Ht 5\' 11"  (1.803 m)   Wt 186 lb (84.4 kg)   SpO2 99%   BMI 25.94 kg/m  VS noted,  Constitutional: Pt appears in NAD HENT: Head: NCAT.  Right Ear: External ear normal.  Left Ear: External ear normal.  Eyes: . Pupils are equal, round, and reactive to light.  Conjunctivae and EOM are normal Nose: without d/c or deformity Neck: Neck supple. Gross normal ROM Cardiovascular: Normal rate and regular rhythm.   Pulmonary/Chest: Effort normal and breath sounds without rales or wheezing.  Abd:  Soft, NT, ND, + BS, no organomegaly Neurological: Pt is alert. At baseline orientation, motor grossly intact Skin: Skin is warm. No rashes, other new lesions, no LE edema Psychiatric: Pt behavior is normal without agitation  All otherwise neg per pt  Lab Results  Component Value Date   WBC 4.8 08/07/2019   HGB 14.9 08/07/2019   HCT 45.2 08/07/2019   PLT 221.0 08/07/2019   GLUCOSE 105 (H) 08/07/2019   CHOL 105 08/07/2019   TRIG 73.0 08/07/2019   HDL 48.40 08/07/2019   LDLCALC 42 08/07/2019   ALT 20 08/07/2019   AST 34 08/07/2019   NA 139 08/07/2019   K 4.2 08/07/2019   CL 104 08/07/2019   CREATININE 0.82 08/07/2019   BUN 5 (L) 08/07/2019   CO2 30 08/07/2019   TSH 0.99 08/07/2019   PSA 0.52 08/07/2019   INR 1.09 10/18/2015   HGBA1C 6.1 08/07/2019   MICROALBUR 4.4 (H) 08/07/2019       Assessment & Plan:
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
## 2019-10-07 ENCOUNTER — Other Ambulatory Visit: Payer: Self-pay

## 2019-10-07 ENCOUNTER — Ambulatory Visit: Payer: Medicare Other | Admitting: Neurology

## 2019-10-07 ENCOUNTER — Encounter: Payer: Self-pay | Admitting: Internal Medicine

## 2019-10-07 ENCOUNTER — Ambulatory Visit (INDEPENDENT_AMBULATORY_CARE_PROVIDER_SITE_OTHER): Payer: Medicare Other | Admitting: Internal Medicine

## 2019-10-07 VITALS — BP 150/70 | HR 76 | Temp 98.3°F | Ht 71.0 in | Wt 186.0 lb

## 2019-10-07 DIAGNOSIS — J309 Allergic rhinitis, unspecified: Secondary | ICD-10-CM

## 2019-10-07 DIAGNOSIS — E785 Hyperlipidemia, unspecified: Secondary | ICD-10-CM | POA: Diagnosis not present

## 2019-10-07 DIAGNOSIS — R413 Other amnesia: Secondary | ICD-10-CM

## 2019-10-07 DIAGNOSIS — F0390 Unspecified dementia without behavioral disturbance: Secondary | ICD-10-CM | POA: Insufficient documentation

## 2019-10-07 DIAGNOSIS — E119 Type 2 diabetes mellitus without complications: Secondary | ICD-10-CM | POA: Diagnosis not present

## 2019-10-07 DIAGNOSIS — I10 Essential (primary) hypertension: Secondary | ICD-10-CM

## 2019-10-07 DIAGNOSIS — J449 Chronic obstructive pulmonary disease, unspecified: Secondary | ICD-10-CM

## 2019-10-07 MED ORDER — DONEPEZIL HCL 5 MG PO TABS: 5.0000 mg | ORAL_TABLET | Freq: Every day | ORAL | 3 refills | Status: DC

## 2019-10-07 MED ORDER — AMLODIPINE BESYLATE 5 MG PO TABS: 5.0000 mg | ORAL_TABLET | Freq: Every day | ORAL | 3 refills | Status: DC

## 2019-10-07 MED ORDER — TRIAMCINOLONE ACETONIDE 55 MCG/ACT NA AERO: 2.0000 | INHALATION_SPRAY | Freq: Every day | NASAL | 12 refills | Status: DC

## 2019-10-07 NOTE — Assessment & Plan Note (Signed)
stable overall by history and exam, recent data reviewed with pt, and pt to continue medical treatment as before,  to f/u any worsening symptoms or concerns  

## 2019-10-07 NOTE — Assessment & Plan Note (Signed)
To add nasacort asd

## 2019-10-07 NOTE — Progress Notes (Signed)
Subjective:    Patient ID: Andre [name], male    DOB: 07/11/1940, 79 y.o.   MRN: [id number]  HPI  Here to f/u; overall doing ok,  Pt denies chest pain, increasing sob or doe, wheezing, orthopnea, PND, increased LE swelling, palpitations, dizziness or syncope.  Pt denies new neurological symptoms such as new headache, or facial or extremity weakness or numbness.  Pt denies polydipsia, polyuria, or low sugar episode.  Pt states overall good compliance with meds, mostly trying to follow appropriate diet, with wt overall stable, Does have several wks ongoing nasal allergy symptoms with clearish congestion, itch and sneezing, without fever, pain, ST, cough, swelling or wheezing.  BP Readings from Last 3 Encounters:  10/07/19 (!) 150/70  09/30/19 (!) 152/78  09/10/19 (!) 162/80  also with c/o by family with him for worsening ST memory with repeated same questions and less comprehension in the last few months Past Medical History:  Diagnosis Date  . Arthritis   . Coronary artery disease, non-occlusive    a. 06/2011: cath for abnormal exercise echo: Only 30-40% proximal RCA. Otherwise normal. b. 10/2015: cath for abnormal NST and atypical CP --> showed mild 30-40% stenosis in the prox-distal RCA  . GERD (gastroesophageal reflux disease)   . HA (headache)   . Hypertension   . 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[place])   . TIA (transient ischemic attack)   . Weakness    Past Surgical History:  Procedure Laterality Date  . CARDIAC CATHETERIZATION  January 2013   Proximal RCA 30-40%. Otherwise normal.  . CARDIAC CATHETERIZATION N/A 10/18/2015   Procedure: Left Heart Cath and Coronary Angiography;  Surgeon: Jettie Booze, MD;  Location: [facility];  Service: Cardiovascular;  Laterality: N/A;  . NM MYOVIEW LTD  March 2015   LOW RISK. No scar or ischemia. Normal EF (55%). No RWMA  . surgical excision of rectal cancer      reports that he has been smoking cigarettes. He has never used smokeless tobacco. He  reports current alcohol use of about 2.0 standard drinks of alcohol per week. He reports that he does not use drugs. family history includes Cancer in his mother; Hypertension in his mother; Liver disease in his father. No Known Allergies Current Outpatient Medications on File Prior to Visit  Medication Sig Dispense Refill  . acetaminophen (TYLENOL) 500 MG tablet Take 500 mg by mouth every 6 (six) hours as needed.    Marland Kitchen aspirin EC 81 MG tablet Take 1 tablet (81 mg total) by mouth daily.    Marland Kitchen atorvastatin (LIPITOR) 20 MG tablet Take 1 tablet (20 mg total) by mouth daily. 90 tablet 3  . hydrochlorothiazide (HYDRODIURIL) 25 MG tablet Take 1 tablet (25 mg total) by mouth daily. 30 tablet 5  . losartan (COZAAR) 100 MG tablet Take 1 tablet (100 mg total) by mouth daily. 90 tablet 3  . omeprazole (PRILOSEC) 40 MG capsule Take 1 capsule (40 mg total) by mouth daily. 30 capsule 5  . tizanidine (ZANAFLEX) 2 MG capsule Take 1 capsule (2 mg total) by mouth at bedtime as needed for muscle spasms. 30 capsule 5  . vitamin B-12 (CYANOCOBALAMIN) 1000 MCG tablet Take 1 tablet (1,000 mcg total) by mouth daily. 90 tablet 3  . Vitamin D, Ergocalciferol, (DRISDOL) 1.25 MG (50000 UNIT) CAPS capsule Take 1 capsule (50,000 Units total) by mouth every 7 (seven) days. 12 capsule 0  . [DISCONTINUED] chlorthalidone (HYGROTON) 25 MG tablet Take 25 mg by mouth daily.  No current facility-administered medications on file prior to visit.   Review of Systems All otherwise neg per pt     Objective:   Physical Exam BP (!) 150/70 (BP Location: Left Arm, Patient Position: Sitting, Cuff Size: Large)   Pulse 76   Temp 98.3 F (36.8 C) (Oral)   Ht 5\' 11"  (1.803 m)   Wt 186 lb (84.4 kg)   SpO2 99%   BMI 25.94 kg/m  VS noted,  Constitutional: Pt appears in NAD HENT: Head: NCAT.  Right Ear: External ear normal.  Left Ear: External ear normal.  Eyes: . Pupils are equal, round, and reactive to light. Conjunctivae and EOM  are normal Nose: without d/c or deformity Neck: Neck supple. Gross normal ROM Cardiovascular: Normal rate and regular rhythm.   Pulmonary/Chest: Effort normal and breath sounds without rales or wheezing.  Abd:  Soft, NT, ND, + BS, no organomegaly Neurological: Pt is alert. At baseline orientation, motor grossly intact Skin: Skin is warm. No rashes, other new lesions, no LE edema Psychiatric: Pt behavior is normal without agitation  All otherwise neg per pt   Lab Results  Component Value Date   WBC 4.8 08/07/2019   HGB 14.9 08/07/2019   HCT 45.2 08/07/2019   PLT 221.0 08/07/2019   GLUCOSE 105 (H) 08/07/2019   CHOL 105 08/07/2019   TRIG 73.0 08/07/2019   HDL 48.40 08/07/2019   LDLCALC 42 08/07/2019   ALT 20 08/07/2019   AST 34 08/07/2019   NA 139 08/07/2019   K 4.2 08/07/2019   CL 104 08/07/2019   CREATININE 0.82 08/07/2019   BUN 5 (L) 08/07/2019   CO2 30 08/07/2019   TSH 0.99 08/07/2019   PSA 0.52 08/07/2019   INR 1.09 10/18/2015   HGBA1C 6.1 08/07/2019   MICROALBUR 4.4 (H) 08/07/2019       Assessment & Plan:

## 2019-10-07 NOTE — Patient Instructions (Addendum)
.  Please take all new medication as prescribed - the nasacort for allergies, and generic aricept for memory  Please continue all other medications as before, except ok to increase the amlodipine to 5 mg per day  Please have the pharmacy call with any other refills you may need.  Please continue your efforts at being more active, low cholesterol diet, and weight control.  You are otherwise up to date with prevention measures today.  Please keep your appointments with your specialists as you may have planned  You should hear soon about the MRI we ordered last visit  You will be contacted regarding the referral for: Neurology  Please make an Appointment to return in 3 months, or sooner if needed

## 2019-10-07 NOTE — Assessment & Plan Note (Addendum)
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

## 2019-10-07 NOTE — Assessment & Plan Note (Addendum)
Uncontrolled, to add amlodipine 5 qd, stable overall by history and exam, recent data reviewed with pt, and pt to continue medical treatment as before,  to f/u any worsening symptoms or concerns

## 2019-10-09 ENCOUNTER — Telehealth (HOSPITAL_COMMUNITY): Payer: Self-pay

## 2019-10-09 NOTE — Telephone Encounter (Signed)
Spoke with the patient, detailed instructions were given. He stated that he understood and would be here for his test. Asked to call back with any questions. S.Andre [name] EMTP

## 2019-10-10 ENCOUNTER — Other Ambulatory Visit (HOSPITAL_COMMUNITY): Payer: Medicare Other

## 2019-10-14 ENCOUNTER — Ambulatory Visit (HOSPITAL_COMMUNITY): Payer: Medicare Other | Attending: Cardiology

## 2019-10-14 ENCOUNTER — Other Ambulatory Visit: Payer: Self-pay

## 2019-10-14 DIAGNOSIS — R079 Chest pain, unspecified: Secondary | ICD-10-CM | POA: Diagnosis not present

## 2019-10-14 LAB — MYOCARDIAL PERFUSION IMAGING
LV dias vol: 115 mL (ref 62–150)
LV sys vol: 56 mL
SDS: 0
SRS: 0
SSS: 0
TID: 0.95

## 2019-10-14 MED ORDER — TECHNETIUM TC 99M TETROFOSMIN IV KIT
10.4000 | PACK | Freq: Once | INTRAVENOUS | Status: AC | PRN
  Administered 2019-10-14: 10.4 via INTRAVENOUS
  Filled 2019-10-14: qty 11

## 2019-10-14 MED ORDER — REGADENOSON 0.4 MG/5ML IV SOLN
0.4000 mg | Freq: Once | INTRAVENOUS | Status: AC
  Administered 2019-10-14: 0.4 mg via INTRAVENOUS

## 2019-10-14 MED ORDER — TECHNETIUM TC 99M TETROFOSMIN IV KIT
32.1000 | PACK | Freq: Once | INTRAVENOUS | Status: AC | PRN
  Administered 2019-10-14: 32.1 via INTRAVENOUS
  Filled 2019-10-14: qty 33

## 2019-11-05 ENCOUNTER — Ambulatory Visit
Admission: RE | Admit: 2019-11-05 | Discharge: 2019-11-05 | Disposition: A | Discharge status: Home / Self Care | Payer: Medicare Other | Source: Ambulatory Visit | Attending: Internal Medicine | Admitting: Internal Medicine

## 2019-11-05 DIAGNOSIS — R519 Headache, unspecified: Secondary | ICD-10-CM

## 2019-11-13 ENCOUNTER — Encounter: Payer: Self-pay | Admitting: Neurology

## 2019-11-13 NOTE — Addendum Note (Signed)
Addended by: Elza Rafter D on: 11/13/2019 02:38 PM   Modules accepted: Orders

## 2019-12-01 ENCOUNTER — Ambulatory Visit (INDEPENDENT_AMBULATORY_CARE_PROVIDER_SITE_OTHER): Payer: Medicare Other | Admitting: Internal Medicine

## 2019-12-01 ENCOUNTER — Encounter: Payer: Self-pay | Admitting: Internal Medicine

## 2019-12-01 ENCOUNTER — Other Ambulatory Visit: Payer: Self-pay

## 2019-12-01 VITALS — BP 130/80 | HR 90 | Temp 98.2°F | Ht 71.0 in | Wt 186.0 lb

## 2019-12-01 DIAGNOSIS — M25562 Pain in left knee: Secondary | ICD-10-CM

## 2019-12-01 DIAGNOSIS — M7652 Patellar tendinitis, left knee: Secondary | ICD-10-CM | POA: Diagnosis not present

## 2019-12-01 DIAGNOSIS — E559 Vitamin D deficiency, unspecified: Secondary | ICD-10-CM | POA: Diagnosis not present

## 2019-12-01 DIAGNOSIS — E538 Deficiency of other specified B group vitamins: Secondary | ICD-10-CM | POA: Insufficient documentation

## 2019-12-01 DIAGNOSIS — I10 Essential (primary) hypertension: Secondary | ICD-10-CM

## 2019-12-01 DIAGNOSIS — F0390 Unspecified dementia without behavioral disturbance: Secondary | ICD-10-CM

## 2019-12-01 DIAGNOSIS — E119 Type 2 diabetes mellitus without complications: Secondary | ICD-10-CM | POA: Diagnosis not present

## 2019-12-01 MED ORDER — VITAMIN B-12 1000 MCG PO TABS: 1000.0000 ug | ORAL_TABLET | Freq: Every day | ORAL | 3 refills | Status: DC

## 2019-12-01 NOTE — Assessment & Plan Note (Signed)
For oral replacement 

## 2019-12-01 NOTE — Patient Instructions (Signed)
.  Please take OTC Vitamin D3 at 2000 units per day, indefinitely, and B12 1000 mcg per day  You will be contacted regarding the referral for: sports medicine  Please continue all other medications as before, and refills have been done if requested.  Please have the pharmacy call with any other refills you may need.  Please continue your efforts at being more active, low cholesterol diet, and weight control.  Please keep your appointments with your specialists as you may have planned

## 2019-12-01 NOTE — Assessment & Plan Note (Addendum)
Benign, for referral sports med  I spent 31 minutes in preparing to see the patient by review of recent labs, imaging and procedures, obtaining and reviewing separately obtained history, communicating with the patient and family or caregiver, ordering medications, tests or procedures, and documenting clinical information in the EHR including the differential Dx, treatment, and any further evaluation and other management of patellar tendonitis, htn, dm, b12 and d deficiency, dementia

## 2019-12-01 NOTE — Progress Notes (Signed)
Subjective:    Patient ID: Andre [name], male    DOB: 07-10-1940, 79 y.o.   MRN: [id number]  HPI  Here to f/u; overall doing ok,  Pt denies chest pain, increasing sob or doe, wheezing, orthopnea, PND, increased LE swelling, palpitations, dizziness or syncope.  Pt denies new neurological symptoms such as new headache, or facial or extremity weakness or numbness.  Pt denies polydipsia, polyuria, or low sugar episode.  Pt states overall good compliance with meds, mostly trying to follow appropriate diet, with wt overall stable,  Pt is not c/o palpiations here today.  Does have a large lump to come up just below the left kneecap in the midline, was mild tender now ok but has been getting bigger and family concerned.   Past Medical History:  Diagnosis Date  . Arthritis   . Coronary artery disease, non-occlusive    a. 06/2011: cath for abnormal exercise echo: Only 30-40% proximal RCA. Otherwise normal. b. 10/2015: cath for abnormal NST and atypical CP --> showed mild 30-40% stenosis in the prox-distal RCA  . GERD (gastroesophageal reflux disease)   . HA (headache)   . Hypertension   . 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[place])   . TIA (transient ischemic attack)   . Weakness    Past Surgical History:  Procedure Laterality Date  . CARDIAC CATHETERIZATION  January 2013   Proximal RCA 30-40%. Otherwise normal.  . CARDIAC CATHETERIZATION N/A 10/18/2015   Procedure: Left Heart Cath and Coronary Angiography;  Surgeon: Jettie Booze, MD;  Location: [facility];  Service: Cardiovascular;  Laterality: N/A;  . NM MYOVIEW LTD  March 2015   LOW RISK. No scar or ischemia. Normal EF (55%). No RWMA  . surgical excision of rectal cancer      reports that he has been smoking cigarettes. He has never used smokeless tobacco. He reports current alcohol use of about 2.0 standard drinks of alcohol per week. He reports that he does not use drugs. family history includes Cancer in his mother; Hypertension in his mother; Liver  disease in his father. No Known Allergies Current Outpatient Medications on File Prior to Visit  Medication Sig Dispense Refill  . acetaminophen (TYLENOL) 500 MG tablet Take 500 mg by mouth every 6 (six) hours as needed.    Marland Kitchen amLODipine (NORVASC) 2.5 MG tablet Take 2.5 mg by mouth daily.    Marland Kitchen aspirin EC 81 MG tablet Take 1 tablet (81 mg total) by mouth daily.    Marland Kitchen atorvastatin (LIPITOR) 20 MG tablet Take 1 tablet (20 mg total) by mouth daily. 90 tablet 3  . donepezil (ARICEPT) 5 MG tablet Take 1 tablet (5 mg total) by mouth at bedtime. 90 tablet 3  . hydrochlorothiazide (HYDRODIURIL) 25 MG tablet Take 1 tablet (25 mg total) by mouth daily. 30 tablet 5  . losartan (COZAAR) 100 MG tablet Take 1 tablet (100 mg total) by mouth daily. 90 tablet 3  . omeprazole (PRILOSEC) 40 MG capsule Take 1 capsule (40 mg total) by mouth daily. 30 capsule 5  . tizanidine (ZANAFLEX) 2 MG capsule Take 1 capsule (2 mg total) by mouth at bedtime as needed for muscle spasms. 30 capsule 5  . triamcinolone (NASACORT) 55 MCG/ACT AERO nasal inhaler Place 2 sprays into the nose daily. 1 Inhaler 12  . Vitamin D, Ergocalciferol, (DRISDOL) 1.25 MG (50000 UNIT) CAPS capsule Take 1 capsule (50,000 Units total) by mouth every 7 (seven) days. 12 capsule 0  . [DISCONTINUED] chlorthalidone (HYGROTON) 25 MG  tablet Take 25 mg by mouth daily.     No current facility-administered medications on file prior to visit.   Review of Systems All otherwise neg per pt    Objective:   Physical Exam BP 130/80 (BP Location: Left Arm, Patient Position: Sitting, Cuff Size: Large)   Pulse 90   Temp 98.2 F (36.8 C) (Oral)   Ht 5\' 11"  (1.803 m)   Wt 186 lb (84.4 kg)   SpO2 96%   BMI 25.94 kg/m  VS noted,  Constitutional: Pt appears in NAD HENT: Head: NCAT.  Right Ear: External ear normal.  Left Ear: External ear normal.  Eyes: . Pupils are equal, round, and reactive to light. Conjunctivae and EOM are normal Nose: without d/c or  deformity Neck: Neck supple. Gross normal ROM Cardiovascular: Normal rate and regular rhythm.   Pulmonary/Chest: Effort normal and breath sounds without rales or wheezing.  Left knee with large egg sized cystic lump too the patellar tendon, mild tender Neurological: Pt is alert. At baseline orientation, motor grossly intact Skin: Skin is warm. No rashes, other new lesions, no LE edema Psychiatric: Pt behavior is normal without agitation  All otherwise neg per pt Lab Results  Component Value Date   WBC 4.8 08/07/2019   HGB 14.9 08/07/2019   HCT 45.2 08/07/2019   PLT 221.0 08/07/2019   GLUCOSE 105 (H) 08/07/2019   CHOL 105 08/07/2019   TRIG 73.0 08/07/2019   HDL 48.40 08/07/2019   LDLCALC 42 08/07/2019   ALT 20 08/07/2019   AST 34 08/07/2019   NA 139 08/07/2019   K 4.2 08/07/2019   CL 104 08/07/2019   CREATININE 0.82 08/07/2019   BUN 5 (L) 08/07/2019   CO2 30 08/07/2019   TSH 0.99 08/07/2019   PSA 0.52 08/07/2019   INR 1.09 10/18/2015   HGBA1C 6.1 08/07/2019   MICROALBUR 4.4 (H) 08/07/2019         Assessment & Plan:

## 2019-12-01 NOTE — Assessment & Plan Note (Signed)
stable overall by history and exam, recent data reviewed with pt, and pt to continue medical treatment as before,  to f/u any worsening symptoms or concerns  

## 2019-12-08 ENCOUNTER — Ambulatory Visit: Payer: Self-pay

## 2019-12-08 ENCOUNTER — Encounter: Payer: Self-pay | Admitting: Family Medicine

## 2019-12-08 ENCOUNTER — Ambulatory Visit (INDEPENDENT_AMBULATORY_CARE_PROVIDER_SITE_OTHER): Payer: Medicare Other | Admitting: Family Medicine

## 2019-12-08 ENCOUNTER — Other Ambulatory Visit: Payer: Self-pay

## 2019-12-08 VITALS — BP 130/80 | HR 84 | Ht 71.0 in | Wt 185.2 lb

## 2019-12-08 DIAGNOSIS — M25462 Effusion, left knee: Secondary | ICD-10-CM | POA: Diagnosis not present

## 2019-12-08 DIAGNOSIS — M25562 Pain in left knee: Secondary | ICD-10-CM

## 2019-12-08 NOTE — Progress Notes (Signed)
    Subjective:    CC: L knee pain  I, Andre [name], LAT, ATC, am serving as scribe for Dr. [name].  HPI: Pt is a 79 y/o male presenting w/ c/o L knee pain and swelling just inferior to the patella x 5 weeks w/ no known MOI.  L knee swelling: yes, just inferior to patella L knee mechanical symptoms:no Aggravating factors: No Treatments tried: Tylenol  Pertinent review of Systems: No fevers or chills  Relevant historical information: History of stroke and rectal cancer.   Objective:    Vitals:   12/08/19 1053  BP: 130/80  Pulse: 84  SpO2: 96%   General: Well Developed, well nourished, and in no acute distress.   MSK: Left knee soft mass overlying patellar tendon nontender with no erythema.  No knee effusion. Normal knee motion.  See ligaments exam. Intact strength.  Lab and Radiology Results  Diagnostic Limited MSK Ultrasound of: Left knee Hypoechoic cystic mass overlying patellar tendon anterior knee consistent with prepatellar bursitis. Impression: Prepatellar bursitis    Impression and Recommendations:    Assessment and Plan: 79 y.o. male with left knee prepatellar bursitis ongoing for about 5 weeks.  Ideally best treatment for this should be aspiration and injection however patient is not looking forward to that and would like to try some more conservative management.  Will recommend Voltaren gel and compression.  Provided Ace wrap with instructions how to use it as well as Voltaren gel.  Recheck back in about 2 weeks if not any better at that point would likely proceed with aspiration and injection however will discuss it with patient further when he arrives for recheck.Andre [name]  PDMP not reviewed this encounter. Orders Placed This Encounter  Procedures  . [place] LIMITED JOINT SPACE STRUCTURES LOW LEFT(NO LINKED CHARGES)    Order Specific Question:   Reason for Exam (SYMPTOM  OR DIAGNOSIS REQUIRED)    Answer:   L knee swelling    Order Specific Question:    Preferred imaging location?    Answer:   [facility]   No orders of the defined types were placed in this encounter.   Discussed warning signs or symptoms. Please see discharge instructions. Patient expresses understanding.   The above documentation has been reviewed and is accurate and complete [name], M.D.

## 2019-12-08 NOTE — Patient Instructions (Signed)
Thank you for coming in today. Use Voltaren get 4x daily.     Try using compression like an ace wrap or compression sleeve.    Recheck in 2 weeks.

## 2019-12-17 ENCOUNTER — Ambulatory Visit (INDEPENDENT_AMBULATORY_CARE_PROVIDER_SITE_OTHER): Payer: Medicare Other | Admitting: Neurology

## 2019-12-17 ENCOUNTER — Encounter: Payer: Self-pay | Admitting: Neurology

## 2019-12-17 VITALS — BP 142/72 | HR 59 | Ht 71.0 in | Wt 186.5 lb

## 2019-12-17 DIAGNOSIS — E538 Deficiency of other specified B group vitamins: Secondary | ICD-10-CM | POA: Diagnosis not present

## 2019-12-17 DIAGNOSIS — G629 Polyneuropathy, unspecified: Secondary | ICD-10-CM | POA: Insufficient documentation

## 2019-12-17 DIAGNOSIS — E559 Vitamin D deficiency, unspecified: Secondary | ICD-10-CM | POA: Diagnosis not present

## 2019-12-17 DIAGNOSIS — G3184 Mild cognitive impairment, so stated: Secondary | ICD-10-CM | POA: Diagnosis not present

## 2019-12-17 DIAGNOSIS — G6289 Other specified polyneuropathies: Secondary | ICD-10-CM

## 2019-12-17 MED ORDER — DONEPEZIL HCL 10 MG PO TABS: 10.0000 mg | ORAL_TABLET | Freq: Every day | ORAL | 4 refills | Status: DC

## 2019-12-17 MED ORDER — MEMANTINE HCL 10 MG PO TABS
10.0000 mg | ORAL_TABLET | Freq: Two times a day (BID) | ORAL | 11 refills | Status: DC
Start: 2019-12-17 — End: 2021-05-21
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
## 2019-12-19 LAB — C-REACTIVE PROTEIN: CRP: 3 mg/L (ref 0–10)

## 2019-12-19 LAB — MULTIPLE MYELOMA PANEL, SERUM
Albumin SerPl Elph-Mcnc: 4.2 g/dL (ref 2.9–4.4)
Albumin/Glob SerPl: 1.3 (ref 0.7–1.7)
Alpha 1: 0.2 g/dL (ref 0.0–0.4)
Alpha2 Glob SerPl Elph-Mcnc: 0.7 g/dL (ref 0.4–1.0)
B-Globulin SerPl Elph-Mcnc: 1.1 g/dL (ref 0.7–1.3)
Gamma Glob SerPl Elph-Mcnc: 1.3 g/dL (ref 0.4–1.8)
Globulin, Total: 3.3 g/dL (ref 2.2–3.9)
IgA/Immunoglobulin A, Serum: 266 mg/dL (ref 61–437)
IgG (Immunoglobin G), Serum: 1173 mg/dL (ref 603–1613)
IgM (Immunoglobulin M), Srm: 112 mg/dL (ref 15–143)
Total Protein: 7.5 g/dL (ref 6.0–8.5)

## 2019-12-19 LAB — HOMOCYSTEINE: Homocysteine: 17.7 umol/L (ref 0.0–19.2)

## 2019-12-19 LAB — ANA W/REFLEX IF POSITIVE: Anti Nuclear Antibody (ANA): NEGATIVE

## 2019-12-19 LAB — COPPER, SERUM: Copper: 103 ug/dL (ref 69–132)

## 2019-12-19 LAB — VITAMIN B12: Vitamin B-12: 541 pg/mL (ref 232–1245)

## 2019-12-19 LAB — VITAMIN D 25 HYDROXY (VIT D DEFICIENCY, FRACTURES): Vit D, 25-Hydroxy: 8.6 ng/mL — ABNORMAL LOW (ref 30.0–100.0)

## 2019-12-19 LAB — FOLATE: Folate: 11.4 ng/mL (ref >3.0)

## 2019-12-19 LAB — SEDIMENTATION RATE: Sed Rate: 13 mm/hr (ref 0–30)

## 2019-12-21 LAB — METHYLMALONIC ACID(MMA), RND URINE
Creatinine(Crt), U: 1.54 g/L (ref 0.30–3.00)
MMA - Normalized: 1.5 umol/mmol cr (ref 0.3–2.8)
Methylmalonic Acid, Ur: 20.4 umol/L (ref 1.6–29.7)

## 2019-12-22 ENCOUNTER — Other Ambulatory Visit: Payer: Self-pay | Admitting: Neurology

## 2019-12-22 ENCOUNTER — Telehealth: Payer: Self-pay | Admitting: Neurology

## 2019-12-22 ENCOUNTER — Encounter: Payer: Self-pay | Admitting: Neurology

## 2019-12-22 DIAGNOSIS — E559 Vitamin D deficiency, unspecified: Secondary | ICD-10-CM

## 2019-12-22 MED ORDER — VITAMIN D (ERGOCALCIFEROL) 1.25 MG (50000 UNIT) PO CAPS: 50000.0000 [IU] | ORAL_CAPSULE | ORAL | 0 refills | Status: DC

## 2019-12-22 NOTE — Telephone Encounter (Signed)
Called the patient and reviewed the lab results. Advised the patient that low vitamin d level. Dr Krista Blue recommends 10 weeks of weekly Vitamin D RX strength. Advised this was already called into pharmacy. Advised that after the 10 weeks of that she would recommend 1/day Vit D3 1000 units. Encouraged the patient to write that down.  Pt verbalized understanding. Pt had no questions at this time but was encouraged to call back if questions arise.

## 2019-12-22 NOTE — Telephone Encounter (Signed)
Please call patient, repeat laboratory evaluation showed significantly decreased vitamin D level, 8.6, I have written vitamin D3 prescription,  Following his 10 weeks vitamin D3 supplement, 50,000 units every week, he should start over-the-counter vitamin D3 1000 units daily.  Meds ordered this encounter  Medications  . Vitamin D, Ergocalciferol, (DRISDOL) 1.25 MG (50000 UNIT) CAPS capsule    Sig: Take 1 capsule (50,000 Units total) by mouth every 7 (seven) days.    Dispense:  10 capsule    Refill:  0

## 2019-12-23 ENCOUNTER — Ambulatory Visit: Payer: Self-pay

## 2019-12-23 ENCOUNTER — Ambulatory Visit (INDEPENDENT_AMBULATORY_CARE_PROVIDER_SITE_OTHER): Payer: Medicare Other | Admitting: Family Medicine

## 2019-12-23 ENCOUNTER — Encounter: Payer: Self-pay | Admitting: Family Medicine

## 2019-12-23 ENCOUNTER — Other Ambulatory Visit: Payer: Self-pay

## 2019-12-23 VITALS — BP 100/60 | HR 80 | Ht 71.0 in | Wt 184.0 lb

## 2019-12-23 DIAGNOSIS — M25462 Effusion, left knee: Secondary | ICD-10-CM | POA: Diagnosis not present

## 2019-12-23 DIAGNOSIS — M25562 Pain in left knee: Secondary | ICD-10-CM | POA: Diagnosis not present

## 2019-12-23 DIAGNOSIS — M7042 Prepatellar bursitis, left knee: Secondary | ICD-10-CM | POA: Diagnosis not present

## 2019-12-23 NOTE — Progress Notes (Signed)
   Rito Ehrlich, am serving as a Education administrator for Dr. [name].  Andre [name] is a 79 y.o. male who presents to [facility] today for follow up of L knee pain. Patient was Last seen by Dr. Georgina Snell on 12/08/2019 for L knee pain and swelling and stated Pain is located just inferior to the patella x 5 weeks w/ no known MOI. Patient was given voltaren gel and told to wear compression wrap or sleeve.   Since last visit patient states Knee is not as sore as last time but the swelling is the same. Patient did use the voltaren gel and compression sleeve which he thinks has helped with the pain.    Pertinent review of systems: No fevers or chills  Relevant historical information: Mild dementia   Exam:  BP 100/60 (BP Location: Left Arm, Patient Position: Sitting, Cuff Size: Normal)   Pulse 80   Ht 5\' 11"  (1.803 m)   Wt 184 lb (83.5 kg)   SpO2 97%   BMI 25.66 kg/m  General: Well Developed, well nourished, and in no acute distress.   MSK: Left knee swelling overlying patella tendon.  Not tender.  Normal knee motion and strength.    Lab and Radiology Results  Diagnostic Limited MSK Ultrasound of: Left knee prepatellar swelling Hypoechoic collection of fluid overlying left patellar tendon consistent with seroma or prepatellar bursitis Impression: Seroma or prepatellar bursitis  Aspiration and injection of left knee prepatellar bursa Consent obtained and timeout performed. Skin sterilized with isopropyl alcohol and ethyl chloride cold spray used to anesthetize the skin. 25 gauge needle used to inject 2 mL of lidocaine subcutaneously and into planned aspiration tract. Skin was again sterilized with isopropyl alcohol. 18-gauge needle was used to access the bursa. 10 mL of dark red blood aspirated. Syringe was exchanged and 40 mg of Kenalog and 1 mL of Marcaine was injected. Patient tolerated procedure well. 4 x 4 and Ace wrap used.   Assessment and Plan: 79  y.o. male with left knee traumatic prepatellar bursitis.  Failed to resolve conservatively.  Plan to treat with aspiration injection as above.  Continue compression.  Recheck in 1 month.    Orders Placed This Encounter  Procedures  . [place] LIMITED JOINT SPACE STRUCTURES LOW LEFT    Standing Status:   Future    Number of Occurrences:   1    Standing Expiration Date:   12/22/2020    Order Specific Question:   Reason for Exam (SYMPTOM  OR DIAGNOSIS REQUIRED)    Answer:   left knee pain    Order Specific Question:   Preferred imaging location?    Answer:   [facility]   No orders of the defined types were placed in this encounter.    Discussed warning signs or symptoms. Please see discharge instructions. Patient expresses understanding.   The above documentation has been reviewed and is accurate and complete [name], M.D.

## 2019-12-23 NOTE — Patient Instructions (Signed)
Thank you for coming in today. Call or go to the ER if you develop a large red swollen joint with extreme pain or oozing puss.  Keep the wrap on it most of the time for 2 weeks or so.  Recheck back in about 1 month.

## 2019-12-25 ENCOUNTER — Other Ambulatory Visit: Payer: Self-pay

## 2019-12-25 ENCOUNTER — Ambulatory Visit (HOSPITAL_COMMUNITY)
Admission: RE | Admit: 2019-12-25 | Discharge: 2019-12-25 | Disposition: A | Discharge status: Home / Self Care | Payer: Medicare Other | Source: Ambulatory Visit | Attending: Neurology | Admitting: Neurology

## 2019-12-25 DIAGNOSIS — G6289 Other specified polyneuropathies: Secondary | ICD-10-CM | POA: Diagnosis not present

## 2019-12-25 DIAGNOSIS — E538 Deficiency of other specified B group vitamins: Secondary | ICD-10-CM | POA: Diagnosis not present

## 2019-12-25 DIAGNOSIS — E559 Vitamin D deficiency, unspecified: Secondary | ICD-10-CM | POA: Insufficient documentation

## 2019-12-25 DIAGNOSIS — G3184 Mild cognitive impairment, so stated: Secondary | ICD-10-CM | POA: Insufficient documentation

## 2020-01-06 ENCOUNTER — Other Ambulatory Visit: Payer: Self-pay

## 2020-01-06 ENCOUNTER — Ambulatory Visit: Payer: Medicare Other | Admitting: Internal Medicine

## 2020-01-06 VITALS — Ht 71.0 in

## 2020-01-08 ENCOUNTER — Other Ambulatory Visit: Payer: Self-pay

## 2020-01-08 ENCOUNTER — Ambulatory Visit (INDEPENDENT_AMBULATORY_CARE_PROVIDER_SITE_OTHER): Payer: Medicare Other | Admitting: Internal Medicine

## 2020-01-08 ENCOUNTER — Encounter: Payer: Self-pay | Admitting: Internal Medicine

## 2020-01-08 VITALS — BP 118/62 | HR 80 | Temp 97.7°F | Ht 71.0 in | Wt 183.0 lb

## 2020-01-08 DIAGNOSIS — E559 Vitamin D deficiency, unspecified: Secondary | ICD-10-CM

## 2020-01-08 DIAGNOSIS — E785 Hyperlipidemia, unspecified: Secondary | ICD-10-CM | POA: Diagnosis not present

## 2020-01-08 DIAGNOSIS — I10 Essential (primary) hypertension: Secondary | ICD-10-CM

## 2020-01-08 DIAGNOSIS — M7652 Patellar tendinitis, left knee: Secondary | ICD-10-CM | POA: Diagnosis not present

## 2020-01-08 DIAGNOSIS — E119 Type 2 diabetes mellitus without complications: Secondary | ICD-10-CM | POA: Diagnosis not present

## 2020-01-08 LAB — POCT GLYCOSYLATED HEMOGLOBIN (HGB A1C): Hemoglobin A1C: 6.4 % — AB (ref 4.0–5.6)

## 2020-01-08 NOTE — Progress Notes (Addendum)
Subjective:    Patient ID: Andre [name], male    DOB: 1940/09/27, 79 y.o.   MRN: [id number]  HPI  Here to f/u; overall doing ok,  Pt denies chest pain, increasing sob or doe, wheezing, orthopnea, PND, increased LE swelling, palpitations, dizziness or syncope.  Pt denies new neurological symptoms such as new headache, or facial or extremity weakness or numbness.  Pt denies polydipsia, polyuria, or low sugar episode.  Pt states overall good compliance with meds, mostly trying to follow appropriate diet, with wt overall stable,  but little exercise however.  Patellar tendonitis improved.  Has been taking oral vit d Past Medical History:  Diagnosis Date  . Arthritis   . Coronary artery disease, non-occlusive    a. 06/2011: cath for abnormal exercise echo: Only 30-40% proximal RCA. Otherwise normal. b. 10/2015: cath for abnormal NST and atypical CP --> showed mild 30-40% stenosis in the prox-distal RCA  . GERD (gastroesophageal reflux disease)   . HA (headache)   . Hypertension   . Memory loss   . 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[place])   . TIA (transient ischemic attack)   . Weakness    Past Surgical History:  Procedure Laterality Date  . CARDIAC CATHETERIZATION  January 2013   Proximal RCA 30-40%. Otherwise normal.  . CARDIAC CATHETERIZATION N/A 10/18/2015   Procedure: Left Heart Cath and Coronary Angiography;  Surgeon: Jettie Booze, MD;  Location: [facility];  Service: Cardiovascular;  Laterality: N/A;  . NM MYOVIEW LTD  March 2015   LOW RISK. No scar or ischemia. Normal EF (55%). No RWMA  . surgical excision of rectal cancer      reports that he has been smoking cigarettes. He has been smoking about 0.50 packs per day. He has never used smokeless tobacco. He reports current alcohol use. He reports that he does not use drugs. family history includes Cancer in his mother; Hypertension in his mother; Liver disease in his father. No Known Allergies Current Outpatient Medications on File Prior to  Visit  Medication Sig Dispense Refill  . acetaminophen (TYLENOL) 500 MG tablet Take 500 mg by mouth every 6 (six) hours as needed.    Marland Kitchen amLODipine (NORVASC) 2.5 MG tablet Take 2.5 mg by mouth daily.    Marland Kitchen aspirin EC 81 MG tablet Take 1 tablet (81 mg total) by mouth daily.    Marland Kitchen atorvastatin (LIPITOR) 20 MG tablet Take 1 tablet (20 mg total) by mouth daily. 90 tablet 3  . donepezil (ARICEPT) 10 MG tablet Take 1 tablet (10 mg total) by mouth at bedtime. 90 tablet 4  . hydrochlorothiazide (HYDRODIURIL) 25 MG tablet Take 1 tablet (25 mg total) by mouth daily. 30 tablet 5  . losartan (COZAAR) 100 MG tablet Take 1 tablet (100 mg total) by mouth daily. 90 tablet 3  . memantine (NAMENDA) 10 MG tablet Take 1 tablet (10 mg total) by mouth 2 (two) times daily. 60 tablet 11  . omeprazole (PRILOSEC) 40 MG capsule Take 1 capsule (40 mg total) by mouth daily. 30 capsule 5  . tizanidine (ZANAFLEX) 2 MG capsule Take 1 capsule (2 mg total) by mouth at bedtime as needed for muscle spasms. 30 capsule 5  . triamcinolone (NASACORT) 55 MCG/ACT AERO nasal inhaler Place 2 sprays into the nose daily. 1 Inhaler 12  . vitamin B-12 (CYANOCOBALAMIN) 1000 MCG tablet Take 1 tablet (1,000 mcg total) by mouth daily. 90 tablet 3  . Vitamin D, Ergocalciferol, (DRISDOL) 1.25 MG (50000 UNIT) CAPS  capsule Take 1 capsule (50,000 Units total) by mouth every 7 (seven) days. 12 capsule 0  . Vitamin D, Ergocalciferol, (DRISDOL) 1.25 MG (50000 UNIT) CAPS capsule Take 1 capsule (50,000 Units total) by mouth every 7 (seven) days. 10 capsule 0   No current facility-administered medications on file prior to visit.   Review of Systems All otherwise neg per pt     Objective:   Physical Exam BP (!) 118/62 (BP Location: Left Arm, Patient Position: Sitting, Cuff Size: Large)   Pulse 80   Temp 97.7 F (36.5 C) (Oral)   Ht 5\' 11"  (1.803 m)   Wt 183 lb (83 kg)   SpO2 97%   BMI 25.52 kg/m  VS noted,  Constitutional: Pt appears in NAD HENT:  Head: NCAT.  Right Ear: External ear normal.  Left Ear: External ear normal.  Eyes: . Pupils are equal, round, and reactive to light. Conjunctivae and EOM are normal Nose: without d/c or deformity Neck: Neck supple. Gross normal ROM Cardiovascular: Normal rate and regular rhythm.   Pulmonary/Chest: Effort normal and breath sounds without rales or wheezing.  Abd:  Soft, NT, ND, + BS, no organomegaly Neurological: Pt is alert. At baseline orientation, motor grossly intact Skin: Skin is warm. No rashes, other new lesions, no LE edema Psychiatric: Pt behavior is normal without agitation  All otherwise neg per pt Lab Results  Component Value Date   WBC 4.8 08/07/2019   HGB 14.9 08/07/2019   HCT 45.2 08/07/2019   PLT 221.0 08/07/2019   GLUCOSE 105 (H) 08/07/2019   CHOL 105 08/07/2019   TRIG 73.0 08/07/2019   HDL 48.40 08/07/2019   LDLCALC 42 08/07/2019   ALT 20 08/07/2019   AST 34 08/07/2019   NA 139 08/07/2019   K 4.2 08/07/2019   CL 104 08/07/2019   CREATININE 0.82 08/07/2019   BUN 5 (L) 08/07/2019   CO2 30 08/07/2019   TSH 0.99 08/07/2019   PSA 0.52 08/07/2019   INR 1.09 10/18/2015   HGBA1C 6.4 (A) 01/08/2020   MICROALBUR 4.4 (H) 08/07/2019   POCT glycosylated hemoglobin (Hb A1C) Order: [id number] Status:  Final result Visible to patient:  No (scheduled for 01/08/2020 11:43 AM) Dx:  Type 2 diabetes mellitus without comp...  0 Result Notes   1 HM Topic  Ref Range & Units 10:42  (01/08/20) 5 mo ago  (08/07/19) 1 yr ago  (06/26/18) 2 yr ago  (08/20/17) 2 yr ago  (03/12/17)  Hemoglobin A1C 4.0 - 5.6 % 6.4Abnormal  6.1 R, CM  7.0High R, CM  7.3High R, CM  8.1High R            Assessment & Plan:

## 2020-01-08 NOTE — Patient Instructions (Signed)
Your A1c was OK today  Please continue all other medications as before, and refills have been done if requested.  Please have the pharmacy call with any other refills you may need.  Please continue your efforts at being more active, low cholesterol diet, and weight control  Please keep your appointments with your specialists as you may have planned  Please make an Appointment to return in 6 months, or sooner if needed

## 2020-01-08 NOTE — Progress Notes (Signed)
   Subjective:    Patient ID: Andre [name], male    DOB: [birth year], 79 y.o.   MRN: [id number]  HPI   Pt decided to leave due to wait  Review of Systems     Objective:   Physical Exam           Assessment & Plan:

## 2020-01-11 ENCOUNTER — Encounter: Payer: Self-pay | Admitting: Internal Medicine

## 2020-01-11 NOTE — Assessment & Plan Note (Signed)
stable overall by history and exam, recent data reviewed with pt, and pt to continue medical treatment as before,  to f/u any worsening symptoms or concerns  

## 2020-01-11 NOTE — Assessment & Plan Note (Signed)
Cont oral replacement 

## 2020-01-11 NOTE — Assessment & Plan Note (Signed)
Improved,  to f/u any worsening symptoms or concerns

## 2020-01-11 NOTE — Assessment & Plan Note (Addendum)
stable overall by history and exam, recent data reviewed with pt, and pt to continue medical treatment as before,  to f/u any worsening symptoms or concerns  I spent 31 minutes in preparing to see the patient by review of recent labs, imaging and procedures, obtaining and reviewing separately obtained history, communicating with the patient and family or caregiver, ordering medications, tests or procedures, and documenting clinical information in the EHR including the differential Dx, treatment, and any further evaluation and other management of vit d defiiency, left knee tendonitis, dm, htn, hld

## 2020-01-13 ENCOUNTER — Encounter: Payer: Self-pay | Admitting: Internal Medicine

## 2020-01-13 NOTE — Patient Instructions (Signed)
none

## 2020-01-28 ENCOUNTER — Ambulatory Visit: Payer: Medicare Other | Admitting: Internal Medicine

## 2020-02-03 ENCOUNTER — Ambulatory Visit: Payer: Medicare Other | Admitting: Neurology

## 2020-02-11 ENCOUNTER — Ambulatory Visit (INDEPENDENT_AMBULATORY_CARE_PROVIDER_SITE_OTHER): Payer: Medicare Other | Admitting: Internal Medicine

## 2020-02-11 ENCOUNTER — Encounter: Payer: Self-pay | Admitting: Internal Medicine

## 2020-02-11 ENCOUNTER — Other Ambulatory Visit: Payer: Self-pay

## 2020-02-11 VITALS — BP 110/70 | HR 74 | Temp 98.2°F | Ht 71.0 in | Wt 183.0 lb

## 2020-02-11 DIAGNOSIS — E119 Type 2 diabetes mellitus without complications: Secondary | ICD-10-CM

## 2020-02-11 DIAGNOSIS — M7652 Patellar tendinitis, left knee: Secondary | ICD-10-CM | POA: Diagnosis not present

## 2020-02-11 DIAGNOSIS — M25511 Pain in right shoulder: Secondary | ICD-10-CM | POA: Insufficient documentation

## 2020-02-11 DIAGNOSIS — M25512 Pain in left shoulder: Secondary | ICD-10-CM

## 2020-02-11 MED ORDER — MELOXICAM 15 MG PO TABS
15.0000 mg | ORAL_TABLET | Freq: Every day | ORAL | 1 refills | Status: DC | PRN
Start: 2020-02-11 — End: 2021-05-14

## 2020-02-11 NOTE — Progress Notes (Signed)
Subjective:    Patient ID: Andre [name], male    DOB: 21-Nov-1940, 79 y.o.   MRN: [id number]  HPI  Here with several msk complaints today, including left shoulder with mild intemrittent sharp pain for several wks with reduced rom such that cannot reach overhead well.  Also Pt continues to have recurring LBP without change in severity, bowel or bladder change, fever, wt loss,  worsening LE pain/numbness/weakness, gait change or falls.  Also has recurrrent left knee pain with minimal swelling, no giveaways, mild, no falls, worse to walk or stand more then 10 min, better to not do this.  Also has painn/tender to right anterior shoulder only, worse to raise the arm overhead, but not to the side or reaching around to the back.  Pt denies chest pain, increased sob or doe, wheezing, orthopnea, PND, increased LE swelling, palpitations, dizziness or syncope.   Pt denies polydipsia, polyuria Past Medical History:  Diagnosis Date  . Arthritis   . Coronary artery disease, non-occlusive    a. 06/2011: cath for abnormal exercise echo: Only 30-40% proximal RCA. Otherwise normal. b. 10/2015: cath for abnormal NST and atypical CP --> showed mild 30-40% stenosis in the prox-distal RCA  . GERD (gastroesophageal reflux disease)   . HA (headache)   . Hypertension   . Memory loss   . 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[place])   . TIA (transient ischemic attack)   . Weakness    Past Surgical History:  Procedure Laterality Date  . CARDIAC CATHETERIZATION  January 2013   Proximal RCA 30-40%. Otherwise normal.  . CARDIAC CATHETERIZATION N/A 10/18/2015   Procedure: Left Heart Cath and Coronary Angiography;  Surgeon: Jettie Booze, MD;  Location: [facility];  Service: Cardiovascular;  Laterality: N/A;  . NM MYOVIEW LTD  March 2015   LOW RISK. No scar or ischemia. Normal EF (55%). No RWMA  . surgical excision of rectal cancer      reports that he has been smoking cigarettes. He has been smoking about 0.50 packs per day. He has  never used smokeless tobacco. He reports current alcohol use. He reports that he does not use drugs. family history includes Cancer in his mother; Hypertension in his mother; Liver disease in his father. No Known Allergies Current Outpatient Medications on File Prior to Visit  Medication Sig Dispense Refill  . acetaminophen (TYLENOL) 500 MG tablet Take 500 mg by mouth every 6 (six) hours as needed.    Marland Kitchen amLODipine (NORVASC) 2.5 MG tablet Take 2.5 mg by mouth daily.    Marland Kitchen aspirin EC 81 MG tablet Take 1 tablet (81 mg total) by mouth daily.    Marland Kitchen atorvastatin (LIPITOR) 20 MG tablet Take 1 tablet (20 mg total) by mouth daily. 90 tablet 3  . donepezil (ARICEPT) 10 MG tablet Take 1 tablet (10 mg total) by mouth at bedtime. 90 tablet 4  . hydrochlorothiazide (HYDRODIURIL) 25 MG tablet Take 1 tablet (25 mg total) by mouth daily. 30 tablet 5  . losartan (COZAAR) 100 MG tablet Take 1 tablet (100 mg total) by mouth daily. 90 tablet 3  . memantine (NAMENDA) 10 MG tablet Take 1 tablet (10 mg total) by mouth 2 (two) times daily. 60 tablet 11  . omeprazole (PRILOSEC) 40 MG capsule Take 1 capsule (40 mg total) by mouth daily. 30 capsule 5  . tizanidine (ZANAFLEX) 2 MG capsule Take 1 capsule (2 mg total) by mouth at bedtime as needed for muscle spasms. 30 capsule 5  .  triamcinolone (NASACORT) 55 MCG/ACT AERO nasal inhaler Place 2 sprays into the nose daily. 1 Inhaler 12  . vitamin B-12 (CYANOCOBALAMIN) 1000 MCG tablet Take 1 tablet (1,000 mcg total) by mouth daily. 90 tablet 3   No current facility-administered medications on file prior to visit.   Review of Systems All otherwise neg per pt    Objective:   Physical Exam BP 110/70 (BP Location: Left Arm, Patient Position: Sitting, Cuff Size: Large)   Pulse 74   Temp 98.2 F (36.8 C) (Oral)   Ht 5\' 11"  (1.803 m)   Wt 183 lb (83 kg)   SpO2 97%   BMI 25.52 kg/m  VS noted,  Constitutional: Pt appears in NAD HENT: Head: NCAT.  Right Ear: External ear  normal.  Left Ear: External ear normal.  Eyes: . Pupils are equal, round, and reactive to light. Conjunctivae and EOM are normal Nose: without d/c or deformity Neck: Neck supple. Gross normal ROM Cardiovascular: Normal rate and regular rhythm.   Pulmonary/Chest: Effort normal and breath sounds without rales or wheezing.  Left shoulder NT but has reduced ROM to lifting overhead, also tenderness noted to right bicipital tendon area, left knee also with slight effusion and crepitus Neurological: Pt is alert. At baseline orientation, motor grossly intact Skin: Skin is warm. No rashes, other new lesions, no LE edema Psychiatric: Pt behavior is normal without agitation  All otherwise neg per pt Lab Results  Component Value Date   WBC 4.8 08/07/2019   HGB 14.9 08/07/2019   HCT 45.2 08/07/2019   PLT 221.0 08/07/2019   GLUCOSE 105 (H) 08/07/2019   CHOL 105 08/07/2019   TRIG 73.0 08/07/2019   HDL 48.40 08/07/2019   LDLCALC 42 08/07/2019   ALT 20 08/07/2019   AST 34 08/07/2019   NA 139 08/07/2019   K 4.2 08/07/2019   CL 104 08/07/2019   CREATININE 0.82 08/07/2019   BUN 5 (L) 08/07/2019   CO2 30 08/07/2019   TSH 0.99 08/07/2019   PSA 0.52 08/07/2019   INR 1.09 10/18/2015   HGBA1C 6.4 (A) 01/08/2020   MICROALBUR 4.4 (H) 08/07/2019      Assessment & Plan:

## 2020-02-11 NOTE — Patient Instructions (Addendum)
Please take all new medication as prescribed - the anti-inflammatory for pain  You will be contacted regarding the referral for: sports medicine (for another day)  Please continue all other medications as before, and refills have been done if requested.  Please have the pharmacy call with any other refills you may need.  Please continue your efforts at being more active, low cholesterol diet, and weight control.  Please keep your appointments with your specialists as you may have planned  Please make an Appointment to return in 3 months, or sooner if needed

## 2020-02-19 ENCOUNTER — Ambulatory Visit: Payer: Medicare Other | Admitting: Family Medicine

## 2020-02-19 NOTE — Progress Notes (Deleted)
   I, Wendy Poet, LAT, ATC, am serving as scribe for Dr. [name].  Andre [name] is a 79 y.o. male who presents to [place] at [facility] today for B shoulder pain.  Pt was last seen by Dr. Georgina Snell on 12/23/19 for his L knee and had an aspiration/injection.  Since then, pt reports new B shoulder pain  Shoulder mechanical symptoms: Aggravating factors: Treatments tried:   Pertinent review of systems: ***  Relevant historical information: ***   Exam:  There were no vitals taken for this visit. General: Well Developed, well nourished, and in no acute distress.   MSK: ***    Lab and Radiology Results No results found for this or any previous visit (from the past 72 hour(s)). No results found.     Assessment and Plan: 79 y.o. male with ***   PDMP not reviewed this encounter. No orders of the defined types were placed in this encounter.  No orders of the defined types were placed in this encounter.    Discussed warning signs or symptoms. Please see discharge instructions. Patient expresses understanding.   ***

## 2020-02-23 ENCOUNTER — Encounter: Payer: Self-pay | Admitting: Internal Medicine

## 2020-02-23 NOTE — Assessment & Plan Note (Signed)
C/w with probable right bicipital tendon insertion site tendonitis -  for mobic 15 qd prn, f/u sports medicine

## 2020-02-23 NOTE — Assessment & Plan Note (Signed)
for mobic 15 qd prn, f/u sports medicine

## 2020-02-23 NOTE — Assessment & Plan Note (Addendum)
Most c/w probable rot cuff tendonitis, for mobic 15 qd prn, f/u sports medicine  I spent 31 minutes in preparing to see the patient by review of recent labs, imaging and procedures, obtaining and reviewing separately obtained history, communicating with the patient and family or caregiver, ordering medications, tests or procedures, and documenting clinical information in the EHR including the differential Dx, treatment, and any further evaluation and other management of left shoulder pain, right shoudler pain, dm, left knee pain

## 2020-02-23 NOTE — Assessment & Plan Note (Signed)
stable overall by history and exam, recent data reviewed with pt, and pt to continue medical treatment as before,  to f/u any worsening symptoms or concerns  

## 2020-02-24 ENCOUNTER — Encounter: Payer: Self-pay | Admitting: Family Medicine

## 2020-02-24 ENCOUNTER — Ambulatory Visit: Payer: Self-pay

## 2020-02-24 ENCOUNTER — Ambulatory Visit (INDEPENDENT_AMBULATORY_CARE_PROVIDER_SITE_OTHER): Payer: Medicare Other | Admitting: Family Medicine

## 2020-02-24 ENCOUNTER — Other Ambulatory Visit: Payer: Self-pay

## 2020-02-24 VITALS — BP 122/62 | HR 50 | Ht 71.0 in | Wt 183.0 lb

## 2020-02-24 DIAGNOSIS — M5416 Radiculopathy, lumbar region: Secondary | ICD-10-CM | POA: Diagnosis not present

## 2020-02-24 DIAGNOSIS — M25512 Pain in left shoulder: Secondary | ICD-10-CM

## 2020-02-24 DIAGNOSIS — M62838 Other muscle spasm: Secondary | ICD-10-CM

## 2020-02-24 DIAGNOSIS — M25511 Pain in right shoulder: Secondary | ICD-10-CM

## 2020-02-24 NOTE — Patient Instructions (Addendum)
Thank you for coming in today. Please use voltaren gel up to 4x daily for pain as needed.  Also ok to try biofreeze or icy hot.  I would like to get you into physical therapy. Home health PT will come to you since you do not drive.   You should hear from them soon.   Use heating pad.    Recheck as needed with me.

## 2020-02-24 NOTE — Progress Notes (Signed)
   Rito Ehrlich, am serving as a Education administrator for Dr. [name].  Andre [name] is a 79 y.o. male who presents to [place] at [facility] today for B shoulder pain.  He was last seen by Dr. Georgina Snell on 12/23/19 for L knee pain and had a L knee aspiration and injection.  Since his last visit, pt reports B shoulder pain w/ attempts at overhead ROM.  He saw his PCP on 02/11/20 and was prescribed Meloxicam.  Since then, pt reports that the meloxicam has not helped. Patient states that the pain is located on top of his shoulders and been going on for 2 months.   Shoulder mechanical symptoms: no  Aggravating factors: reaching overhead  Treatments tried: Meloxicam; icy hot   Pertinent review of systems: No fevers or chills  Relevant historical information: Mild dementia.  History prior lumbar radiculopathy.  Does not drive.   Exam:  BP 122/62 (BP Location: Left Arm, Patient Position: Sitting, Cuff Size: Normal)   Pulse (!) 50   Ht 5\' 11"  (1.803 m)   Wt 183 lb (83 kg)   SpO2 95%   BMI 25.52 kg/m  General: Well Developed, well nourished, and in no acute distress.   MSK: C-spine normal-appearing Nontender midline. Normal cervical motion. Tender palpation bilateral trapezius. Upper extremity strength reflexes and sensation are equal normal throughout.  Bilateral shoulders normal-appearing nontender normal motion normal strength.  Negative impingement testing.  L-spine normal-appearing Nontender midline. Normal lumbar motion. Positive left-sided straight leg raise test. Reflexes and strength are intact.  Left knee small prepatellar bursitis present nontender. Normal knee motion.  Nontender.  Pulses intact left lower extremity.  Assessment and Plan: 79 y.o. male with bilateral shoulder pain.  Pain mostly at trapezius due to cervical paraspinal muscular/trapezius dysfunction.  The best treatment for this would be physical therapy however patient has mild dementia and does  not drive.  Home health physical therapy is next best option.  Plan for trial of PT and recheck back if not improving.  Avoid muscle relaxers as this may exacerbate dementia symptoms.  Heating pad reasonable.  Posterior left lower leg pain.  Most likely lumbosacral radiculopathy.  Patient does have degenerative changes on x-ray lumbar spine from 2018.  Again trial of physical therapy.  Patient I do not think is a good candidate for gabapentin given his mild dementia.  Steroids would also would like to avoid given his diabetes.  Recheck back in a few weeks if not improving would consider steroids at that time along with gabapentin.     PDMP not reviewed this encounter. Orders Placed This Encounter  Procedures  . Ambulatory referral to Home Health    Referral Priority:   Routine    Referral Type:   Home Health Care    Referral Reason:   Specialty Services Required    Requested Specialty:   [facility]    Number of Visits Requested:   1   No orders of the defined types were placed in this encounter.    Discussed warning signs or symptoms. Please see discharge instructions. Patient expresses understanding.   The above documentation has been reviewed and is accurate and complete [name], M.D.

## 2020-02-26 ENCOUNTER — Telehealth: Payer: Self-pay | Admitting: Family Medicine

## 2020-02-26 NOTE — Telephone Encounter (Signed)
Andre [name] from [facility] looking for clarity on recent orders. We ordered PT and Nursing, but the note only discussed the need for PT. Did we also want Nursing to go out? If so, they need info as to what the nursing needs are.  Andre [name] [phone]

## 2020-02-26 NOTE — Telephone Encounter (Signed)
Andre [name] and LM w/ information regarding needs for PT only and no nursing.

## 2020-02-26 NOTE — Telephone Encounter (Signed)
PT only needed at this time.

## 2020-02-27 ENCOUNTER — Telehealth: Payer: Self-pay

## 2020-02-27 NOTE — Telephone Encounter (Signed)
Andre [name] home health calling stating patients start date for home health is Tuesday the 21st.

## 2020-02-29 ENCOUNTER — Emergency Department (HOSPITAL_COMMUNITY): Payer: Medicare Other

## 2020-02-29 ENCOUNTER — Emergency Department (HOSPITAL_COMMUNITY)
Admission: EM | Admit: 2020-02-29 | Discharge: 2020-02-29 | Disposition: A | Discharge status: Home / Self Care | Payer: Medicare Other | Attending: Emergency Medicine | Admitting: Emergency Medicine

## 2020-02-29 ENCOUNTER — Encounter (HOSPITAL_COMMUNITY): Payer: Self-pay

## 2020-02-29 DIAGNOSIS — I10 Essential (primary) hypertension: Secondary | ICD-10-CM | POA: Diagnosis not present

## 2020-02-29 DIAGNOSIS — Z5321 Procedure and treatment not carried out due to patient leaving prior to being seen by health care provider: Secondary | ICD-10-CM | POA: Diagnosis not present

## 2020-02-29 DIAGNOSIS — R079 Chest pain, unspecified: Secondary | ICD-10-CM | POA: Insufficient documentation

## 2020-02-29 LAB — CBC
HCT: 45.9 % (ref 39.0–52.0)
Hemoglobin: 15.2 g/dL (ref 13.0–17.0)
MCH: 31.3 pg (ref 26.0–34.0)
MCHC: 33.1 g/dL (ref 30.0–36.0)
MCV: 94.6 fL (ref 80.0–100.0)
Platelets: 206 10*3/uL (ref 150–400)
RBC: 4.85 MIL/uL (ref 4.22–5.81)
RDW: 13.4 % (ref 11.5–15.5)
WBC: 6.8 10*3/uL (ref 4.0–10.5)
nRBC: 0 % (ref 0.0–0.2)

## 2020-02-29 LAB — BASIC METABOLIC PANEL
Anion gap: 10 (ref 5–15)
BUN: 8 mg/dL (ref 8–23)
CO2: 27 mmol/L (ref 22–32)
Calcium: 9.5 mg/dL (ref 8.9–10.3)
Chloride: 98 mmol/L (ref 98–111)
Creatinine, Ser: 0.79 mg/dL (ref 0.61–1.24)
GFR calc Af Amer: >60 mL/min (ref >60)
GFR calc non Af Amer: >60 mL/min (ref >60)
Glucose, Bld: 113 mg/dL — ABNORMAL HIGH (ref 70–99)
Potassium: 3.6 mmol/L (ref 3.5–5.1)
Sodium: 135 mmol/L (ref 135–145)

## 2020-02-29 LAB — TROPONIN I (HIGH SENSITIVITY): Troponin I (High Sensitivity): 5 ng/L (ref <18)

## 2020-02-29 NOTE — ED Notes (Signed)
Called x 3 NO answer

## 2020-02-29 NOTE — ED Notes (Signed)
Called pt x 3 no answer 

## 2020-02-29 NOTE — ED Triage Notes (Signed)
Pt arrives to ED w/ c/o 8/10 centrally located, non-radiating chest pain that started 1 month ago, however, worsened this evening. Pt denies sob, n/v, diaphoresis. Pt has hx of HLD, HTN.

## 2020-03-02 DIAGNOSIS — M25511 Pain in right shoulder: Secondary | ICD-10-CM | POA: Diagnosis not present

## 2020-03-02 DIAGNOSIS — Z550 Illiteracy and low-level literacy: Secondary | ICD-10-CM | POA: Diagnosis not present

## 2020-03-02 DIAGNOSIS — M7042 Prepatellar bursitis, left knee: Secondary | ICD-10-CM | POA: Diagnosis not present

## 2020-03-02 DIAGNOSIS — Z8673 Personal history of transient ischemic attack (TIA), and cerebral infarction without residual deficits: Secondary | ICD-10-CM | POA: Diagnosis not present

## 2020-03-02 DIAGNOSIS — J449 Chronic obstructive pulmonary disease, unspecified: Secondary | ICD-10-CM | POA: Diagnosis not present

## 2020-03-02 DIAGNOSIS — M25512 Pain in left shoulder: Secondary | ICD-10-CM | POA: Diagnosis not present

## 2020-03-02 DIAGNOSIS — I10 Essential (primary) hypertension: Secondary | ICD-10-CM | POA: Diagnosis not present

## 2020-03-02 DIAGNOSIS — M1990 Unspecified osteoarthritis, unspecified site: Secondary | ICD-10-CM | POA: Diagnosis not present

## 2020-03-02 DIAGNOSIS — M5416 Radiculopathy, lumbar region: Secondary | ICD-10-CM | POA: Diagnosis not present

## 2020-03-02 DIAGNOSIS — Z7982 Long term (current) use of aspirin: Secondary | ICD-10-CM | POA: Diagnosis not present

## 2020-03-02 DIAGNOSIS — I2510 Atherosclerotic heart disease of native coronary artery without angina pectoris: Secondary | ICD-10-CM | POA: Diagnosis not present

## 2020-03-02 DIAGNOSIS — E1142 Type 2 diabetes mellitus with diabetic polyneuropathy: Secondary | ICD-10-CM | POA: Diagnosis not present

## 2020-03-02 DIAGNOSIS — F17210 Nicotine dependence, cigarettes, uncomplicated: Secondary | ICD-10-CM | POA: Diagnosis not present

## 2020-03-03 ENCOUNTER — Other Ambulatory Visit: Payer: Self-pay | Admitting: Internal Medicine

## 2020-03-05 DIAGNOSIS — M1990 Unspecified osteoarthritis, unspecified site: Secondary | ICD-10-CM | POA: Diagnosis not present

## 2020-03-05 DIAGNOSIS — Z550 Illiteracy and low-level literacy: Secondary | ICD-10-CM | POA: Diagnosis not present

## 2020-03-05 DIAGNOSIS — E1142 Type 2 diabetes mellitus with diabetic polyneuropathy: Secondary | ICD-10-CM | POA: Diagnosis not present

## 2020-03-05 DIAGNOSIS — I2510 Atherosclerotic heart disease of native coronary artery without angina pectoris: Secondary | ICD-10-CM | POA: Diagnosis not present

## 2020-03-05 DIAGNOSIS — Z7982 Long term (current) use of aspirin: Secondary | ICD-10-CM | POA: Diagnosis not present

## 2020-03-05 DIAGNOSIS — Z8673 Personal history of transient ischemic attack (TIA), and cerebral infarction without residual deficits: Secondary | ICD-10-CM | POA: Diagnosis not present

## 2020-03-05 DIAGNOSIS — J449 Chronic obstructive pulmonary disease, unspecified: Secondary | ICD-10-CM | POA: Diagnosis not present

## 2020-03-05 DIAGNOSIS — M5416 Radiculopathy, lumbar region: Secondary | ICD-10-CM | POA: Diagnosis not present

## 2020-03-05 DIAGNOSIS — M25512 Pain in left shoulder: Secondary | ICD-10-CM | POA: Diagnosis not present

## 2020-03-05 DIAGNOSIS — M25511 Pain in right shoulder: Secondary | ICD-10-CM | POA: Diagnosis not present

## 2020-03-05 DIAGNOSIS — M7042 Prepatellar bursitis, left knee: Secondary | ICD-10-CM | POA: Diagnosis not present

## 2020-03-05 DIAGNOSIS — F17210 Nicotine dependence, cigarettes, uncomplicated: Secondary | ICD-10-CM | POA: Diagnosis not present

## 2020-03-05 DIAGNOSIS — I10 Essential (primary) hypertension: Secondary | ICD-10-CM | POA: Diagnosis not present

## 2020-03-09 DIAGNOSIS — F17210 Nicotine dependence, cigarettes, uncomplicated: Secondary | ICD-10-CM | POA: Diagnosis not present

## 2020-03-09 DIAGNOSIS — Z8673 Personal history of transient ischemic attack (TIA), and cerebral infarction without residual deficits: Secondary | ICD-10-CM | POA: Diagnosis not present

## 2020-03-09 DIAGNOSIS — M5416 Radiculopathy, lumbar region: Secondary | ICD-10-CM | POA: Diagnosis not present

## 2020-03-09 DIAGNOSIS — I10 Essential (primary) hypertension: Secondary | ICD-10-CM | POA: Diagnosis not present

## 2020-03-09 DIAGNOSIS — M25512 Pain in left shoulder: Secondary | ICD-10-CM | POA: Diagnosis not present

## 2020-03-09 DIAGNOSIS — E1142 Type 2 diabetes mellitus with diabetic polyneuropathy: Secondary | ICD-10-CM | POA: Diagnosis not present

## 2020-03-09 DIAGNOSIS — J449 Chronic obstructive pulmonary disease, unspecified: Secondary | ICD-10-CM | POA: Diagnosis not present

## 2020-03-09 DIAGNOSIS — M1990 Unspecified osteoarthritis, unspecified site: Secondary | ICD-10-CM | POA: Diagnosis not present

## 2020-03-09 DIAGNOSIS — M7042 Prepatellar bursitis, left knee: Secondary | ICD-10-CM | POA: Diagnosis not present

## 2020-03-09 DIAGNOSIS — Z7982 Long term (current) use of aspirin: Secondary | ICD-10-CM | POA: Diagnosis not present

## 2020-03-09 DIAGNOSIS — Z550 Illiteracy and low-level literacy: Secondary | ICD-10-CM | POA: Diagnosis not present

## 2020-03-09 DIAGNOSIS — M25511 Pain in right shoulder: Secondary | ICD-10-CM | POA: Diagnosis not present

## 2020-03-09 DIAGNOSIS — I2510 Atherosclerotic heart disease of native coronary artery without angina pectoris: Secondary | ICD-10-CM | POA: Diagnosis not present

## 2020-03-12 DIAGNOSIS — M25512 Pain in left shoulder: Secondary | ICD-10-CM | POA: Diagnosis not present

## 2020-03-12 DIAGNOSIS — M5416 Radiculopathy, lumbar region: Secondary | ICD-10-CM | POA: Diagnosis not present

## 2020-03-12 DIAGNOSIS — M25511 Pain in right shoulder: Secondary | ICD-10-CM | POA: Diagnosis not present

## 2020-03-12 DIAGNOSIS — J449 Chronic obstructive pulmonary disease, unspecified: Secondary | ICD-10-CM | POA: Diagnosis not present

## 2020-03-12 DIAGNOSIS — Z550 Illiteracy and low-level literacy: Secondary | ICD-10-CM | POA: Diagnosis not present

## 2020-03-12 DIAGNOSIS — Z8673 Personal history of transient ischemic attack (TIA), and cerebral infarction without residual deficits: Secondary | ICD-10-CM | POA: Diagnosis not present

## 2020-03-12 DIAGNOSIS — M7042 Prepatellar bursitis, left knee: Secondary | ICD-10-CM | POA: Diagnosis not present

## 2020-03-12 DIAGNOSIS — Z7982 Long term (current) use of aspirin: Secondary | ICD-10-CM | POA: Diagnosis not present

## 2020-03-12 DIAGNOSIS — F17210 Nicotine dependence, cigarettes, uncomplicated: Secondary | ICD-10-CM | POA: Diagnosis not present

## 2020-03-12 DIAGNOSIS — I2510 Atherosclerotic heart disease of native coronary artery without angina pectoris: Secondary | ICD-10-CM | POA: Diagnosis not present

## 2020-03-12 DIAGNOSIS — I10 Essential (primary) hypertension: Secondary | ICD-10-CM | POA: Diagnosis not present

## 2020-03-12 DIAGNOSIS — E1142 Type 2 diabetes mellitus with diabetic polyneuropathy: Secondary | ICD-10-CM | POA: Diagnosis not present

## 2020-03-12 DIAGNOSIS — M1990 Unspecified osteoarthritis, unspecified site: Secondary | ICD-10-CM | POA: Diagnosis not present

## 2020-03-16 DIAGNOSIS — I2510 Atherosclerotic heart disease of native coronary artery without angina pectoris: Secondary | ICD-10-CM | POA: Diagnosis not present

## 2020-03-16 DIAGNOSIS — M7042 Prepatellar bursitis, left knee: Secondary | ICD-10-CM | POA: Diagnosis not present

## 2020-03-16 DIAGNOSIS — M25512 Pain in left shoulder: Secondary | ICD-10-CM | POA: Diagnosis not present

## 2020-03-16 DIAGNOSIS — Z550 Illiteracy and low-level literacy: Secondary | ICD-10-CM | POA: Diagnosis not present

## 2020-03-16 DIAGNOSIS — Z7982 Long term (current) use of aspirin: Secondary | ICD-10-CM | POA: Diagnosis not present

## 2020-03-16 DIAGNOSIS — M5416 Radiculopathy, lumbar region: Secondary | ICD-10-CM | POA: Diagnosis not present

## 2020-03-16 DIAGNOSIS — J449 Chronic obstructive pulmonary disease, unspecified: Secondary | ICD-10-CM | POA: Diagnosis not present

## 2020-03-16 DIAGNOSIS — F17210 Nicotine dependence, cigarettes, uncomplicated: Secondary | ICD-10-CM | POA: Diagnosis not present

## 2020-03-16 DIAGNOSIS — Z8673 Personal history of transient ischemic attack (TIA), and cerebral infarction without residual deficits: Secondary | ICD-10-CM | POA: Diagnosis not present

## 2020-03-16 DIAGNOSIS — M1990 Unspecified osteoarthritis, unspecified site: Secondary | ICD-10-CM | POA: Diagnosis not present

## 2020-03-16 DIAGNOSIS — E1142 Type 2 diabetes mellitus with diabetic polyneuropathy: Secondary | ICD-10-CM | POA: Diagnosis not present

## 2020-03-16 DIAGNOSIS — I10 Essential (primary) hypertension: Secondary | ICD-10-CM | POA: Diagnosis not present

## 2020-03-16 DIAGNOSIS — M25511 Pain in right shoulder: Secondary | ICD-10-CM | POA: Diagnosis not present

## 2020-03-19 DIAGNOSIS — I2510 Atherosclerotic heart disease of native coronary artery without angina pectoris: Secondary | ICD-10-CM | POA: Diagnosis not present

## 2020-03-19 DIAGNOSIS — M25511 Pain in right shoulder: Secondary | ICD-10-CM | POA: Diagnosis not present

## 2020-03-19 DIAGNOSIS — Z8673 Personal history of transient ischemic attack (TIA), and cerebral infarction without residual deficits: Secondary | ICD-10-CM | POA: Diagnosis not present

## 2020-03-19 DIAGNOSIS — Z7982 Long term (current) use of aspirin: Secondary | ICD-10-CM | POA: Diagnosis not present

## 2020-03-19 DIAGNOSIS — M7042 Prepatellar bursitis, left knee: Secondary | ICD-10-CM | POA: Diagnosis not present

## 2020-03-19 DIAGNOSIS — J449 Chronic obstructive pulmonary disease, unspecified: Secondary | ICD-10-CM | POA: Diagnosis not present

## 2020-03-19 DIAGNOSIS — M1990 Unspecified osteoarthritis, unspecified site: Secondary | ICD-10-CM | POA: Diagnosis not present

## 2020-03-19 DIAGNOSIS — M25512 Pain in left shoulder: Secondary | ICD-10-CM | POA: Diagnosis not present

## 2020-03-19 DIAGNOSIS — Z550 Illiteracy and low-level literacy: Secondary | ICD-10-CM | POA: Diagnosis not present

## 2020-03-19 DIAGNOSIS — M5416 Radiculopathy, lumbar region: Secondary | ICD-10-CM | POA: Diagnosis not present

## 2020-03-19 DIAGNOSIS — E1142 Type 2 diabetes mellitus with diabetic polyneuropathy: Secondary | ICD-10-CM | POA: Diagnosis not present

## 2020-03-19 DIAGNOSIS — I10 Essential (primary) hypertension: Secondary | ICD-10-CM | POA: Diagnosis not present

## 2020-03-19 DIAGNOSIS — F17210 Nicotine dependence, cigarettes, uncomplicated: Secondary | ICD-10-CM | POA: Diagnosis not present

## 2020-03-25 DIAGNOSIS — M25511 Pain in right shoulder: Secondary | ICD-10-CM | POA: Diagnosis not present

## 2020-03-25 DIAGNOSIS — Z550 Illiteracy and low-level literacy: Secondary | ICD-10-CM | POA: Diagnosis not present

## 2020-03-25 DIAGNOSIS — M25512 Pain in left shoulder: Secondary | ICD-10-CM | POA: Diagnosis not present

## 2020-03-25 DIAGNOSIS — Z7982 Long term (current) use of aspirin: Secondary | ICD-10-CM | POA: Diagnosis not present

## 2020-03-25 DIAGNOSIS — Z8673 Personal history of transient ischemic attack (TIA), and cerebral infarction without residual deficits: Secondary | ICD-10-CM | POA: Diagnosis not present

## 2020-03-25 DIAGNOSIS — F17210 Nicotine dependence, cigarettes, uncomplicated: Secondary | ICD-10-CM | POA: Diagnosis not present

## 2020-03-25 DIAGNOSIS — M1990 Unspecified osteoarthritis, unspecified site: Secondary | ICD-10-CM | POA: Diagnosis not present

## 2020-03-25 DIAGNOSIS — J449 Chronic obstructive pulmonary disease, unspecified: Secondary | ICD-10-CM | POA: Diagnosis not present

## 2020-03-25 DIAGNOSIS — M5416 Radiculopathy, lumbar region: Secondary | ICD-10-CM | POA: Diagnosis not present

## 2020-03-25 DIAGNOSIS — I10 Essential (primary) hypertension: Secondary | ICD-10-CM | POA: Diagnosis not present

## 2020-03-25 DIAGNOSIS — I2510 Atherosclerotic heart disease of native coronary artery without angina pectoris: Secondary | ICD-10-CM | POA: Diagnosis not present

## 2020-03-25 DIAGNOSIS — M7042 Prepatellar bursitis, left knee: Secondary | ICD-10-CM | POA: Diagnosis not present

## 2020-03-25 DIAGNOSIS — E1142 Type 2 diabetes mellitus with diabetic polyneuropathy: Secondary | ICD-10-CM | POA: Diagnosis not present

## 2020-03-26 DIAGNOSIS — E1142 Type 2 diabetes mellitus with diabetic polyneuropathy: Secondary | ICD-10-CM | POA: Diagnosis not present

## 2020-03-26 DIAGNOSIS — M25511 Pain in right shoulder: Secondary | ICD-10-CM | POA: Diagnosis not present

## 2020-03-26 DIAGNOSIS — I2510 Atherosclerotic heart disease of native coronary artery without angina pectoris: Secondary | ICD-10-CM | POA: Diagnosis not present

## 2020-03-26 DIAGNOSIS — I10 Essential (primary) hypertension: Secondary | ICD-10-CM | POA: Diagnosis not present

## 2020-03-26 DIAGNOSIS — M25512 Pain in left shoulder: Secondary | ICD-10-CM | POA: Diagnosis not present

## 2020-03-26 DIAGNOSIS — M5416 Radiculopathy, lumbar region: Secondary | ICD-10-CM | POA: Diagnosis not present

## 2020-03-26 DIAGNOSIS — Z8673 Personal history of transient ischemic attack (TIA), and cerebral infarction without residual deficits: Secondary | ICD-10-CM | POA: Diagnosis not present

## 2020-03-26 DIAGNOSIS — M1990 Unspecified osteoarthritis, unspecified site: Secondary | ICD-10-CM | POA: Diagnosis not present

## 2020-03-26 DIAGNOSIS — M7042 Prepatellar bursitis, left knee: Secondary | ICD-10-CM | POA: Diagnosis not present

## 2020-03-26 DIAGNOSIS — Z550 Illiteracy and low-level literacy: Secondary | ICD-10-CM | POA: Diagnosis not present

## 2020-03-26 DIAGNOSIS — Z7982 Long term (current) use of aspirin: Secondary | ICD-10-CM | POA: Diagnosis not present

## 2020-03-26 DIAGNOSIS — J449 Chronic obstructive pulmonary disease, unspecified: Secondary | ICD-10-CM | POA: Diagnosis not present

## 2020-03-26 DIAGNOSIS — F17210 Nicotine dependence, cigarettes, uncomplicated: Secondary | ICD-10-CM | POA: Diagnosis not present

## 2020-03-31 DIAGNOSIS — R0789 Other chest pain: Secondary | ICD-10-CM | POA: Diagnosis not present

## 2020-03-31 DIAGNOSIS — R079 Chest pain, unspecified: Secondary | ICD-10-CM | POA: Diagnosis not present

## 2020-04-01 ENCOUNTER — Telehealth: Payer: Self-pay

## 2020-04-01 NOTE — Telephone Encounter (Signed)
Spoke with team health who has stated pt has called spoke with team health with complaints of left sided chest pains radiating to his left arm, back and leg.  Pt was advised to go to the ED or UC asap.   However pt is refusing as EMS was already called on 03/31/20 for the same sxs the pt is having today. EMS did inform pt there is a wait and pt refused to go to the ED.

## 2020-04-01 NOTE — Telephone Encounter (Signed)
[facility] Report/Call: ---Caller states he has chest pain that is going down his left arm. has pain from his left hip to his ankle. Called 911 last night and he was told that he would be waiting in the ER all night so he did not go to the ER. Pain started last night  Advised call EMS now.

## 2020-04-01 NOTE — Telephone Encounter (Signed)
Very sorry, I have nothing to offere as we are not equipped at our office to evaluate this level of symptoms

## 2020-04-02 DIAGNOSIS — E1142 Type 2 diabetes mellitus with diabetic polyneuropathy: Secondary | ICD-10-CM | POA: Diagnosis not present

## 2020-04-02 DIAGNOSIS — Z8673 Personal history of transient ischemic attack (TIA), and cerebral infarction without residual deficits: Secondary | ICD-10-CM | POA: Diagnosis not present

## 2020-04-02 DIAGNOSIS — F17210 Nicotine dependence, cigarettes, uncomplicated: Secondary | ICD-10-CM | POA: Diagnosis not present

## 2020-04-02 DIAGNOSIS — Z7982 Long term (current) use of aspirin: Secondary | ICD-10-CM | POA: Diagnosis not present

## 2020-04-02 DIAGNOSIS — M25512 Pain in left shoulder: Secondary | ICD-10-CM | POA: Diagnosis not present

## 2020-04-02 DIAGNOSIS — M1990 Unspecified osteoarthritis, unspecified site: Secondary | ICD-10-CM | POA: Diagnosis not present

## 2020-04-02 DIAGNOSIS — I10 Essential (primary) hypertension: Secondary | ICD-10-CM | POA: Diagnosis not present

## 2020-04-02 DIAGNOSIS — I2510 Atherosclerotic heart disease of native coronary artery without angina pectoris: Secondary | ICD-10-CM | POA: Diagnosis not present

## 2020-04-02 DIAGNOSIS — M5416 Radiculopathy, lumbar region: Secondary | ICD-10-CM | POA: Diagnosis not present

## 2020-04-02 DIAGNOSIS — Z550 Illiteracy and low-level literacy: Secondary | ICD-10-CM | POA: Diagnosis not present

## 2020-04-02 DIAGNOSIS — M25511 Pain in right shoulder: Secondary | ICD-10-CM | POA: Diagnosis not present

## 2020-04-02 DIAGNOSIS — M7042 Prepatellar bursitis, left knee: Secondary | ICD-10-CM | POA: Diagnosis not present

## 2020-04-02 DIAGNOSIS — J449 Chronic obstructive pulmonary disease, unspecified: Secondary | ICD-10-CM | POA: Diagnosis not present

## 2020-04-06 DIAGNOSIS — M5416 Radiculopathy, lumbar region: Secondary | ICD-10-CM | POA: Diagnosis not present

## 2020-04-06 DIAGNOSIS — M1990 Unspecified osteoarthritis, unspecified site: Secondary | ICD-10-CM | POA: Diagnosis not present

## 2020-04-06 DIAGNOSIS — M7042 Prepatellar bursitis, left knee: Secondary | ICD-10-CM | POA: Diagnosis not present

## 2020-04-06 DIAGNOSIS — Z7982 Long term (current) use of aspirin: Secondary | ICD-10-CM | POA: Diagnosis not present

## 2020-04-06 DIAGNOSIS — J449 Chronic obstructive pulmonary disease, unspecified: Secondary | ICD-10-CM | POA: Diagnosis not present

## 2020-04-06 DIAGNOSIS — I10 Essential (primary) hypertension: Secondary | ICD-10-CM | POA: Diagnosis not present

## 2020-04-06 DIAGNOSIS — I2510 Atherosclerotic heart disease of native coronary artery without angina pectoris: Secondary | ICD-10-CM | POA: Diagnosis not present

## 2020-04-06 DIAGNOSIS — M25511 Pain in right shoulder: Secondary | ICD-10-CM | POA: Diagnosis not present

## 2020-04-06 DIAGNOSIS — F17210 Nicotine dependence, cigarettes, uncomplicated: Secondary | ICD-10-CM | POA: Diagnosis not present

## 2020-04-06 DIAGNOSIS — E1142 Type 2 diabetes mellitus with diabetic polyneuropathy: Secondary | ICD-10-CM | POA: Diagnosis not present

## 2020-04-06 DIAGNOSIS — Z550 Illiteracy and low-level literacy: Secondary | ICD-10-CM | POA: Diagnosis not present

## 2020-04-06 DIAGNOSIS — Z8673 Personal history of transient ischemic attack (TIA), and cerebral infarction without residual deficits: Secondary | ICD-10-CM | POA: Diagnosis not present

## 2020-04-06 DIAGNOSIS — M25512 Pain in left shoulder: Secondary | ICD-10-CM | POA: Diagnosis not present

## 2020-04-20 ENCOUNTER — Ambulatory Visit: Payer: Medicare Other | Admitting: Neurology

## 2020-06-22 ENCOUNTER — Ambulatory Visit: Payer: Medicare Other | Admitting: Neurology
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
## 2020-06-24 ENCOUNTER — Ambulatory Visit (INDEPENDENT_AMBULATORY_CARE_PROVIDER_SITE_OTHER): Payer: Medicare Other | Admitting: Neurology

## 2020-06-24 ENCOUNTER — Encounter: Payer: Self-pay | Admitting: Neurology

## 2020-06-24 VITALS — BP 130/77 | HR 90 | Ht 71.0 in | Wt 186.0 lb

## 2020-06-24 DIAGNOSIS — F0390 Unspecified dementia without behavioral disturbance: Secondary | ICD-10-CM

## 2020-06-24 DIAGNOSIS — E538 Deficiency of other specified B group vitamins: Secondary | ICD-10-CM | POA: Diagnosis not present

## 2020-06-24 DIAGNOSIS — E559 Vitamin D deficiency, unspecified: Secondary | ICD-10-CM | POA: Diagnosis not present

## 2020-06-24 DIAGNOSIS — G6289 Other specified polyneuropathies: Secondary | ICD-10-CM | POA: Diagnosis not present
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
## 2020-06-24 NOTE — Patient Instructions (Signed)
Check vitamin D level Continue medications  see you back in 6 months, bring your daughter next time

## 2020-06-25 LAB — VITAMIN D 25 HYDROXY (VIT D DEFICIENCY, FRACTURES): Vit D, 25-Hydroxy: 8 ng/mL — ABNORMAL LOW (ref 30.0–100.0)

## 2020-06-30 ENCOUNTER — Telehealth: Payer: Self-pay | Admitting: *Deleted

## 2020-06-30 MED ORDER — VITAMIN D (ERGOCALCIFEROL) 1.25 MG (50000 UNIT) PO CAPS
50000.0000 [IU] | ORAL_CAPSULE | ORAL | 0 refills | Status: DC
Start: 2020-06-30 — End: 2020-12-15

## 2020-06-30 NOTE — Telephone Encounter (Signed)
Spoke to pt and relayed lab results.  He stated he did take the vit d for the allotted time but I do not think he took OTC.  I relayed that per AL/NP she wanted to know if he took this and since low again may repeat this and once finished will then need to take OTC 1000units daily.  He verbalized understanding. I called [facility] [place] location to verify picking up (he did not pick up 12-12-19 per Dr. Krista Blue for D2 10 wk ).

## 2020-06-30 NOTE — Telephone Encounter (Signed)
I called Andre [name] and relayed the information about the Vit D level and receommendation for him again.  She would pick up the vit D herself and speak with her sister about this as well.  Will f/u with pcp who see's the pt more frequently then we do.  Take one capsule 50,000u once week, then 1000u daily afterthat.  She verbalized understanding and was appreciative of [place] calling.

## 2020-06-30 NOTE — Telephone Encounter (Signed)
-----   Message from Suzzanne Cloud, NP sent at 06/29/2020  6:17 AM EST ----- Vitamin D level came back very low again at 8.0, this was consistent with previous at 8.6 25-months ago. At that time, I am not clear he took the treatment (may need to check with pharmacy if he filled it?. Please send back to me with his response.   Following his 10 weeks vitamin D3 supplement, 50,000 units every week, he should start over-the-counter vitamin D3 1000 units daily.

## 2020-06-30 NOTE — Telephone Encounter (Signed)
Think this is for you.

## 2020-06-30 NOTE — Telephone Encounter (Signed)
Thank for you calling the pharmacy, since he didn't pick it up, he should repeat dosing as ordered by Dr. Krista Blue.  10 weeks Vitamin D3 supplement 50,000 units once weekly, then OTC D3 1000 units daily.  His PCP should follow this. May be helpful to call his daughter. I sent new script.

## 2020-07-01 ENCOUNTER — Ambulatory Visit (HOSPITAL_COMMUNITY): Admission: EM | Admit: 2020-07-01 | Discharge: 2020-07-01 | Payer: Medicare Other

## 2020-07-01 ENCOUNTER — Encounter (HOSPITAL_COMMUNITY): Payer: Self-pay

## 2020-07-01 ENCOUNTER — Emergency Department (HOSPITAL_COMMUNITY): Payer: Medicare Other

## 2020-07-01 ENCOUNTER — Other Ambulatory Visit: Payer: Self-pay

## 2020-07-01 ENCOUNTER — Emergency Department (HOSPITAL_COMMUNITY)
Admission: EM | Admit: 2020-07-01 | Discharge: 2020-07-01 | Disposition: A | Discharge status: Home / Self Care | Payer: Medicare Other | Attending: Emergency Medicine | Admitting: Emergency Medicine

## 2020-07-01 DIAGNOSIS — R519 Headache, unspecified: Secondary | ICD-10-CM | POA: Diagnosis not present

## 2020-07-01 DIAGNOSIS — R079 Chest pain, unspecified: Secondary | ICD-10-CM

## 2020-07-01 DIAGNOSIS — Z5321 Procedure and treatment not carried out due to patient leaving prior to being seen by health care provider: Secondary | ICD-10-CM | POA: Insufficient documentation

## 2020-07-01 LAB — BASIC METABOLIC PANEL
Anion gap: 12 (ref 5–15)
BUN: 6 mg/dL — ABNORMAL LOW (ref 8–23)
CO2: 32 mmol/L (ref 22–32)
Calcium: 9.6 mg/dL (ref 8.9–10.3)
Chloride: 95 mmol/L — ABNORMAL LOW (ref 98–111)
Creatinine, Ser: 0.86 mg/dL (ref 0.61–1.24)
GFR, Estimated: >60 mL/min (ref >60)
Glucose, Bld: 108 mg/dL — ABNORMAL HIGH (ref 70–99)
Potassium: 3.6 mmol/L (ref 3.5–5.1)
Sodium: 139 mmol/L (ref 135–145)

## 2020-07-01 LAB — CBC
HCT: 49 % (ref 39.0–52.0)
Hemoglobin: 16.8 g/dL (ref 13.0–17.0)
MCH: 32.4 pg (ref 26.0–34.0)
MCHC: 34.3 g/dL (ref 30.0–36.0)
MCV: 94.6 fL (ref 80.0–100.0)
Platelets: 244 10*3/uL (ref 150–400)
RBC: 5.18 MIL/uL (ref 4.22–5.81)
RDW: 14 % (ref 11.5–15.5)
WBC: 5.9 10*3/uL (ref 4.0–10.5)
nRBC: 0 % (ref 0.0–0.2)

## 2020-07-01 LAB — TROPONIN I (HIGH SENSITIVITY): Troponin I (High Sensitivity): 7 ng/L (ref <18)

## 2020-07-01 NOTE — ED Triage Notes (Signed)
Patient presents to Urgent Care with complaints of headahe and chest pain since this morning. Patient reports this has been an on-going issue x 6 months. Pt rates chest discomfort 8/10 intermittent pain. Describes pain "something crawling in my chest."   Denies SOB

## 2020-07-01 NOTE — ED Notes (Signed)
Patient is being discharged from the Urgent Care and sent to the Emergency Department via wheelchair by staff . Per provider Merrie Roof, patient is in need of higher level of care due to chest pain. Patient is aware and verbalizes understanding of plan of care.   Vitals:   07/01/20 0843  BP: (S) (!) 143/76  Pulse: 71  Temp: 97.9 F (36.6 C)  SpO2: 100%

## 2020-07-01 NOTE — ED Provider Notes (Signed)
[facility]    CSN: [id number] Arrival date & time: 07/01/20  6962      History   Chief Complaint No chief complaint on file.   HPI Andre [name] is a 80 y.o. male.   Here today with CP and HA which he states has been ongoing intermittently for about 6 months now but a bit worse and different today. Heavy sharp pain central chest that does not seem to be associated with movement, deep breaths, eating, coughing. Denies palpitations, SOB, diaphoresis, arm pain, N/V. Has not been trying anything OTC for this. Known hx of CVA, HTN, CAD, COPD, DM, GERD, smoking. No known sick contacts, injuries to area.      Past Medical History:  Diagnosis Date  . Arthritis   . Coronary artery disease, non-occlusive    a. 06/2011: cath for abnormal exercise echo: Only 30-40% proximal RCA. Otherwise normal. b. 10/2015: cath for abnormal NST and atypical CP --> showed mild 30-40% stenosis in the prox-distal RCA  . GERD (gastroesophageal reflux disease)   . HA (headache)   . Hypertension   . Memory loss   . 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[place])   . TIA (transient ischemic attack)   . Weakness     Patient Active Problem List   Diagnosis Date Noted  . Left shoulder pain 02/11/2020  . Right shoulder pain 02/11/2020  . Vitamin B12 deficiency 12/17/2019  . Mild cognitive impairment 12/17/2019  . Peripheral neuropathy 12/17/2019  . B12 deficiency 12/01/2019  . Vitamin D deficiency 12/01/2019  . Patellar tendonitis of left knee 12/01/2019  . Allergic rhinitis 10/07/2019  . Dementia ([place]) 10/07/2019  . CAD (coronary artery disease) 09/30/2019  . Amnesia 09/10/2019  . Right-sided nosebleed 08/07/2019  . Encounter for well adult exam with abnormal findings 08/07/2019  . Leg cramping 08/07/2019  . Chest pain 03/23/2019  . Epistaxis 03/23/2019  . Hyperlipidemia 12/24/2017  . Alcohol use 12/24/2017  . Non-compliance 12/24/2017  . 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 Left lumbar radiculopathy  08/02/2017  . Atherosclerosis 06/03/2017  . Low-level of literacy 05/12/2017  . Osteoarthritis 04/20/2017  . Diabetes ([place]) 03/12/2017  . Back pain 02/29/2016  . Abnormal stress test 10/18/2015  . Erectile dysfunction 02/25/2015  . Cigarette smoker 02/10/2015  . COPD GOLD 0 11/08/2014  . Hemorrhoids 02/11/2014  . 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 Headache 10/23/2011  . 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[place]) 10/23/2011  . GERD (gastroesophageal reflux disease) 10/23/2011  . Nicotine addiction 10/23/2011  . 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 Cervical stenosis of spinal canal 10/23/2011  . Essential hypertension, benign 07/10/2011    Past Surgical History:  Procedure Laterality Date  . CARDIAC CATHETERIZATION  January 2013   Proximal RCA 30-40%. Otherwise normal.  . CARDIAC CATHETERIZATION N/A 10/18/2015   Procedure: Left Heart Cath and Coronary Angiography;  Surgeon: Jettie Booze, MD;  Location: [facility];  Service: Cardiovascular;  Laterality: N/A;  . NM MYOVIEW LTD  March 2015   LOW RISK. No scar or ischemia. Normal EF (55%). No RWMA  . surgical excision of rectal cancer         Home Medications    Prior to Admission medications   Medication Sig Start Date End Date Taking? Authorizing Provider  acetaminophen (TYLENOL) 500 MG tablet Take 500 mg by mouth every 6 (six) hours as needed.    [provider]  amLODipine (NORVASC) 2.5 MG tablet Take 2.5 mg by mouth daily. 10/30/19   [provider]  aspirin EC 81 MG tablet Take 1 tablet (81 mg total) by mouth daily. 03/24/19   Binnie Rail, MD  atorvastatin (LIPITOR) 20 MG tablet Take 1 tablet (20 mg total) by mouth daily. 08/07/19   Biagio Borg, MD  donepezil (ARICEPT) 10 MG tablet Take 1 tablet (10 mg total) by mouth at bedtime. 12/17/19   Marcial Pacas, MD  hydrochlorothiazide (HYDRODIURIL) 25 MG tablet TAKE 1 TABLET(25 MG) BY MOUTH DAILY 03/03/20   Biagio Borg, MD  losartan (COZAAR) 100 MG tablet Take 1  tablet (100 mg total) by mouth daily. 08/07/19   Biagio Borg, MD  meloxicam (MOBIC) 15 MG tablet Take 1 tablet (15 mg total) by mouth daily as needed for pain. 02/11/20   Biagio Borg, MD  memantine (NAMENDA) 10 MG tablet Take 1 tablet (10 mg total) by mouth 2 (two) times daily. 12/17/19   Marcial Pacas, MD  omeprazole (PRILOSEC) 40 MG capsule Take 1 capsule (40 mg total) by mouth daily. 09/10/19   Binnie Rail, MD  tizanidine (ZANAFLEX) 2 MG capsule Take 1 capsule (2 mg total) by mouth at bedtime as needed for muscle spasms. 08/07/19   Biagio Borg, MD  triamcinolone (NASACORT) 55 MCG/ACT AERO nasal inhaler Place 2 sprays into the nose daily. 10/07/19   Biagio Borg, MD  vitamin B-12 (CYANOCOBALAMIN) 1000 MCG tablet Take 1 tablet (1,000 mcg total) by mouth daily. 12/01/19   Biagio Borg, MD  Vitamin D, Ergocalciferol, (DRISDOL) 1.25 MG (50000 UNIT) CAPS capsule Take 1 capsule (50,000 Units total) by mouth every 7 (seven) days. 06/30/20   Suzzanne Cloud, NP    Family History Family History  Problem Relation Age of Onset  . Hypertension Mother   . Cancer Mother   . Liver disease Father     Social History Social History   Tobacco Use  . Smoking status: Current Some Day Smoker    Packs/day: 0.50    Types: Cigarettes  . Smokeless tobacco: Never Used  Vaping Use  . Vaping Use: Never used  Substance Use Topics  . Alcohol use: Yes    Comment: drinks "every now and then"  . Drug use: No     Allergies   Patient has no known allergies.   Review of Systems Review of Systems PER HPI    Physical Exam Triage Vital Signs ED Triage Vitals  Enc Vitals Group     BP 07/01/20 0843 (S) (!) 143/76     Pulse Rate 07/01/20 0843 71     Resp --      Temp 07/01/20 0843 97.9 F (36.6 C)     Temp src --      SpO2 07/01/20 0843 100 %     Weight --      Height --      Head Circumference --      Peak Flow --      Pain Score 07/01/20 0947 6     Pain Loc --      Pain Edu? --      Excl. in [place]?  --    No data found.  Updated Vital Signs BP (S) (!) 143/76 (BP Location: Right Arm)   Pulse 71   Temp 97.9 F (36.6 C)   SpO2 100%   Visual Acuity Right Eye Distance:   Left Eye Distance:   Bilateral Distance:    Right Eye Near:   Left Eye Near:  Bilateral Near:     Physical Exam Vitals and nursing note reviewed.  Constitutional:      Appearance: Normal appearance.  HENT:     Head: Atraumatic.     Mouth/Throat:     Mouth: Mucous membranes are moist.     Pharynx: Oropharynx is clear.  Eyes:     Extraocular Movements: Extraocular movements intact.     Conjunctiva/sclera: Conjunctivae normal.  Cardiovascular:     Rate and Rhythm: Normal rate and regular rhythm.     Heart sounds: Normal heart sounds.  Pulmonary:     Effort: Pulmonary effort is normal. No respiratory distress.     Breath sounds: Normal breath sounds. No wheezing or rales.  Abdominal:     General: Bowel sounds are normal. There is no distension.     Palpations: Abdomen is soft.     Tenderness: There is no abdominal tenderness. There is no guarding.  Musculoskeletal:        General: Normal range of motion.     Cervical back: Normal range of motion and neck supple.  Skin:    General: Skin is warm and dry.  Neurological:     General: No focal deficit present.     Mental Status: He is oriented to person, place, and time. Mental status is at baseline.  Psychiatric:        Mood and Affect: Mood normal.        Thought Content: Thought content normal.        Judgment: Judgment normal.      UC Treatments / Results  Labs (all labs ordered are listed, but only abnormal results are displayed) Labs Reviewed - No data to display  EKG   Radiology DG Chest 2 View  Result Date: 07/01/2020 CLINICAL DATA:  Hervey Ard chest pain in this 80 year old male EXAM: CHEST - 2 VIEW COMPARISON:  February 29, 2020 FINDINGS: Trachea midline. Cardiomediastinal contours and hilar structures are normal. Lungs are clear.  No sign of pleural effusion. On limited assessment no acute skeletal process. IMPRESSION: No acute cardiopulmonary disease. Electronically Signed   By: Zetta Bills M.D.   On: 07/01/2020 10:37    Procedures Procedures (including critical care time)  Medications Ordered in UC Medications - No data to display  Initial Impression / Assessment and Plan / UC Course  I have reviewed the triage vital signs and the nursing notes.  Pertinent labs & imaging results that were available during my care of the patient were reviewed by me and considered in my medical decision making (see chart for details).     Overall well appearing today, hemodynamically stable, and EKG showing sinus rhythm with PVCs and nonspecific ST and T wave changes which is consistent overall with previous EKGs reviewed. Long discussion today with patient that in order to r/o a cardiac cause to his CP he would need to be evaluated in the ED, particularly given his age and numerous cardiac risk factors. He is agreeable to this and requests nursing staff to transfer him down to [facility] ED. Patient stable and transferred in wheelchair to ED by nursing staff.   Final Clinical Impressions(s) / UC Diagnoses   Final diagnoses:  Chest pain, unspecified type  Acute nonintractable headache, unspecified headache type   Discharge Instructions   None    ED Prescriptions    None     PDMP not reviewed this encounter.   Volney American, [place] 07/01/20 Bosie Helper

## 2020-07-01 NOTE — ED Triage Notes (Signed)
Pt presents from UC with c/o cp. Pt reports cp and headache since x2 weeks ago. Pt reports chest pain was worse today. Pt denies any sob or N&V.Pt reports the pain is 8/10 and feels like "something crawling in his chest".

## 2020-07-01 NOTE — ED Notes (Signed)
Pt said he will contact primary for results

## 2020-07-07 ENCOUNTER — Other Ambulatory Visit: Payer: Self-pay

## 2020-07-07 ENCOUNTER — Encounter: Payer: Self-pay | Admitting: Internal Medicine

## 2020-07-07 ENCOUNTER — Ambulatory Visit (INDEPENDENT_AMBULATORY_CARE_PROVIDER_SITE_OTHER): Payer: Medicare Other | Admitting: Internal Medicine

## 2020-07-07 VITALS — BP 138/68 | HR 74 | Temp 98.7°F | Ht 71.0 in | Wt 183.0 lb

## 2020-07-07 DIAGNOSIS — Z23 Encounter for immunization: Secondary | ICD-10-CM

## 2020-07-07 DIAGNOSIS — R079 Chest pain, unspecified: Secondary | ICD-10-CM

## 2020-07-07 DIAGNOSIS — Z0100 Encounter for examination of eyes and vision without abnormal findings: Secondary | ICD-10-CM | POA: Diagnosis not present

## 2020-07-07 DIAGNOSIS — R0789 Other chest pain: Secondary | ICD-10-CM | POA: Diagnosis not present

## 2020-07-07 DIAGNOSIS — I10 Essential (primary) hypertension: Secondary | ICD-10-CM | POA: Diagnosis not present

## 2020-07-07 DIAGNOSIS — E1165 Type 2 diabetes mellitus with hyperglycemia: Secondary | ICD-10-CM | POA: Diagnosis not present

## 2020-07-07 DIAGNOSIS — R040 Epistaxis: Secondary | ICD-10-CM | POA: Diagnosis not present

## 2020-07-07 DIAGNOSIS — Z1159 Encounter for screening for other viral diseases: Secondary | ICD-10-CM | POA: Diagnosis not present

## 2020-07-07 NOTE — Patient Instructions (Addendum)
You had the flu shot today  Please continue all other medications as before, and refills have been done if requested.  Please have the pharmacy call with any other refills you may need.  Please continue your efforts at being more active, low cholesterol diet, and weight control.  Please keep your appointments with your specialists as you may have planned  You will be contacted regarding the referral for: stress test for the heart, as well as an eye doctor referral, AND the ENT for the nose bleeding  Please make an Appointment to return in 3 months

## 2020-07-07 NOTE — Progress Notes (Signed)
Established Patient Office Visit  Subjective:  Patient ID: Andre [name], male    DOB: July 22, 1940  Age: 80 y.o. MRN: [id number]       Chief Complaint:  follow up ED visit jan 20, seen at [facility] but left ED early for epistaxis and CP       HPI:  Andre [name] is a 80 y.o. male here to f/u above; no further epistaxis but had several prior to the ED visit; also had intermittent SSCP dull without radiation but with mild sob, but no nausea, diaphoresis, palp or dizziness.  Also incidentally due for optho yearly exam, flu shot, hep C screen, and covid booster and declines pneumovax .        Wt Readings from Last 3 Encounters:  07/07/20 183 lb (83 kg)  07/01/20 182 lb (82.6 kg)  06/24/20 186 lb (84.4 kg)   BP Readings from Last 3 Encounters:  07/07/20 138/68  07/01/20 (!) 143/88  07/01/20 (S) (!) 143/76  also' Immunization History  Administered Date(s) Administered  . Fluad Quad(high Dose 65+) 07/07/2020  . Influenza,inj,Quad PF,6+ Mos 12/17/2014  . Influenza-Unspecified 05/12/2014  . PFIZER(Purple Top)SARS-COV-2 Vaccination 08/14/2019, 09/16/2019   Health Maintenance Due  Topic Date Due  . Hepatitis C Screening  Never done  . OPHTHALMOLOGY EXAM  Never done  . HEMOGLOBIN A1C  07/10/2020    Past Medical History:  Diagnosis Date  . Arthritis   . Coronary artery disease, non-occlusive    a. 06/2011: cath for abnormal exercise echo: Only 30-40% proximal RCA. Otherwise normal. b. 10/2015: cath for abnormal NST and atypical CP --> showed mild 30-40% stenosis in the prox-distal RCA  . GERD (gastroesophageal reflux disease)   . HA (headache)   . Hypertension   . Memory loss   . 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[place])   . TIA (transient ischemic attack)   . Weakness    Past Surgical History:  Procedure Laterality Date  . CARDIAC CATHETERIZATION  January 2013   Proximal RCA 30-40%. Otherwise normal.  . CARDIAC CATHETERIZATION N/A 10/18/2015   Procedure: Left Heart Cath and Coronary Angiography;  Surgeon:  Jettie Booze, MD;  Location: [facility];  Service: Cardiovascular;  Laterality: N/A;  . NM MYOVIEW LTD  March 2015   LOW RISK. No scar or ischemia. Normal EF (55%). No RWMA  . surgical excision of rectal cancer      reports that he has been smoking cigarettes. He has been smoking about 0.50 packs per day. He has never used smokeless tobacco. He reports current alcohol use. He reports that he does not use drugs. family history includes Cancer in his mother; Hypertension in his mother; Liver disease in his father. No Known Allergies Current Outpatient Medications on File Prior to Visit  Medication Sig Dispense Refill  . acetaminophen (TYLENOL) 500 MG tablet Take 500 mg by mouth every 6 (six) hours as needed.    Marland Kitchen amLODipine (NORVASC) 2.5 MG tablet Take 2.5 mg by mouth daily.    Marland Kitchen aspirin EC 81 MG tablet Take 1 tablet (81 mg total) by mouth daily.    Marland Kitchen atorvastatin (LIPITOR) 20 MG tablet Take 1 tablet (20 mg total) by mouth daily. 90 tablet 3  . donepezil (ARICEPT) 10 MG tablet Take 1 tablet (10 mg total) by mouth at bedtime. 90 tablet 4  . hydrochlorothiazide (HYDRODIURIL) 25 MG tablet TAKE 1 TABLET(25 MG) BY MOUTH DAILY 30 tablet 5  . losartan (COZAAR) 100 MG tablet Take 1 tablet (100 mg  total) by mouth daily. 90 tablet 3  . meloxicam (MOBIC) 15 MG tablet Take 1 tablet (15 mg total) by mouth daily as needed for pain. 90 tablet 1  . memantine (NAMENDA) 10 MG tablet Take 1 tablet (10 mg total) by mouth 2 (two) times daily. 60 tablet 11  . omeprazole (PRILOSEC) 40 MG capsule Take 1 capsule (40 mg total) by mouth daily. 30 capsule 5  . tizanidine (ZANAFLEX) 2 MG capsule Take 1 capsule (2 mg total) by mouth at bedtime as needed for muscle spasms. 30 capsule 5  . triamcinolone (NASACORT) 55 MCG/ACT AERO nasal inhaler Place 2 sprays into the nose daily. 1 Inhaler 12  . vitamin B-12 (CYANOCOBALAMIN) 1000 MCG tablet Take 1 tablet (1,000 mcg total) by mouth daily. 90 tablet 3  . Vitamin D,  Ergocalciferol, (DRISDOL) 1.25 MG (50000 UNIT) CAPS capsule Take 1 capsule (50,000 Units total) by mouth every 7 (seven) days. 10 capsule 0   No current facility-administered medications on file prior to visit.        ROS:  All others reviewed and negative.  Objective        PE:  BP 138/68   Pulse 74   Temp 98.7 F (37.1 C) (Temporal)   Ht 5\' 11"  (1.803 m)   Wt 183 lb (83 kg)   SpO2 98%   BMI 25.52 kg/m                 Constitutional: Pt appears in NAD               HENT: Head: NCAT.                Right Ear: External ear normal.                 Left Ear: External ear normal.                Eyes: . Pupils are equal, round, and reactive to light. Conjunctivae and EOM are normal               Nose: without d/c or deformity               Neck: Neck supple. Gross normal ROM               Cardiovascular: Normal rate and regular rhythm.                 Pulmonary/Chest: Effort normal and breath sounds without rales or wheezing.                Abd:  Soft, NT, ND, + BS, no organomegaly               Neurological: Pt is alert. At baseline orientation, motor grossly intact               Skin: Skin is warm. No rashes, no other new lesions, LE edema - none               Psychiatric: Pt behavior is normal without agitation   Assessment/Plan:  Andre [name] is a 80 y.o. Black or African American [2] male with  has a past medical history of Arthritis, Coronary artery disease, non-occlusive, GERD (gastroesophageal reflux disease), HA (headache), Hypertension, Memory loss, 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[place]), TIA (transient ischemic attack), and Weakness.   Assessment Plan  See problem oriented assessment and plan Labs/data reviewed for each problem:  Micro: none  Cardiac tracings I have personally interpreted  today:  none  Pertinent Radiological findings (summarize): none    Health Maintenance Due  Topic Date Due  . Hepatitis C Screening  Never done  . OPHTHALMOLOGY EXAM  Never done  . HEMOGLOBIN A1C   07/10/2020    There are no preventive care reminders to display for this patient.  Lab Results  Component Value Date   TSH 0.99 08/07/2019   Lab Results  Component Value Date   WBC 5.9 07/01/2020   HGB 16.8 07/01/2020   HCT 49.0 07/01/2020   MCV 94.6 07/01/2020   PLT 244 07/01/2020   Lab Results  Component Value Date   NA 139 07/01/2020   K 3.6 07/01/2020   CO2 32 07/01/2020   GLUCOSE 108 (H) 07/01/2020   BUN 6 (L) 07/01/2020   CREATININE 0.86 07/01/2020   BILITOT 1.3 (H) 08/07/2019   ALKPHOS 83 08/07/2019   AST 34 08/07/2019   ALT 20 08/07/2019   PROT 7.5 12/17/2019   ALBUMIN 4.0 08/07/2019   CALCIUM 9.6 07/01/2020   ANIONGAP 12 07/01/2020   GFR 110.06 08/07/2019   Lab Results  Component Value Date   CHOL 105 08/07/2019   Lab Results  Component Value Date   HDL 48.40 08/07/2019   Lab Results  Component Value Date   LDLCALC 42 08/07/2019   Lab Results  Component Value Date   TRIG 73.0 08/07/2019   Lab Results  Component Value Date   CHOLHDL 2 08/07/2019   Lab Results  Component Value Date   HGBA1C 6.4 (A) 01/08/2020      Assessment & Plan:   Problem List Items Addressed This Visit      High   Epistaxis - Primary    Also for ENT referral      Relevant Orders   Ambulatory referral to ENT   Chest pain    Atypical, for stress test      Relevant Orders   Cardiac Stress Test: Informed Consent Details: Physician/Practitioner Attestation; Transcribe to consent form and obtain patient signature   Myocardial Perfusion Imaging     Medium   Essential hypertension, benign (Chronic)    BP Readings from Last 3 Encounters:  07/07/20 138/68  07/01/20 (!) 143/88  07/01/20 (S) (!) 143/76   Stable, pt to continue medical treatment amlodipine, hct, and losartan   Current Outpatient Medications (Cardiovascular):  .  amLODipine (NORVASC) 2.5 MG tablet, Take 2.5 mg by mouth daily. Marland Kitchen  atorvastatin (LIPITOR) 20 MG tablet, Take 1 tablet (20 mg total)  by mouth daily. .  hydrochlorothiazide (HYDRODIURIL) 25 MG tablet, TAKE 1 TABLET(25 MG) BY MOUTH DAILY .  losartan (COZAAR) 100 MG tablet, Take 1 tablet (100 mg total) by mouth daily.  Current Outpatient Medications (Respiratory):  .  triamcinolone (NASACORT) 55 MCG/ACT AERO nasal inhaler, Place 2 sprays into the nose daily.  Current Outpatient Medications (Analgesics):  .  acetaminophen (TYLENOL) 500 MG tablet, Take 500 mg by mouth every 6 (six) hours as needed. Marland Kitchen  aspirin EC 81 MG tablet, Take 1 tablet (81 mg total) by mouth daily. .  meloxicam (MOBIC) 15 MG tablet, Take 1 tablet (15 mg total) by mouth daily as needed for pain.  Current Outpatient Medications (Hematological):  .  vitamin B-12 (CYANOCOBALAMIN) 1000 MCG tablet, Take 1 tablet (1,000 mcg total) by mouth daily.  Current Outpatient Medications (Other):  .  donepezil (ARICEPT) 10 MG tablet, Take 1 tablet (10 mg total) by mouth at bedtime. .  memantine (NAMENDA) 10 MG tablet, Take  1 tablet (10 mg total) by mouth 2 (two) times daily. Marland Kitchen  omeprazole (PRILOSEC) 40 MG capsule, Take 1 capsule (40 mg total) by mouth daily. .  tizanidine (ZANAFLEX) 2 MG capsule, Take 1 capsule (2 mg total) by mouth at bedtime as needed for muscle spasms. .  Vitamin D, Ergocalciferol, (DRISDOL) 1.25 MG (50000 UNIT) CAPS capsule, Take 1 capsule (50,000 Units total) by mouth every 7 (seven) days.      Diabetes ([place])    Lab Results  Component Value Date   HGBA1C 6.4 (A) 01/08/2020   Stable, pt to continue current medical treatment  - diet, and refer optho for yearly exam        Other Visit Diagnoses    Need for hepatitis C screening test       Relevant Orders   Hepatitis C Antibody   Eye exam, routine       Relevant Orders   Ambulatory referral to Ophthalmology   Needs flu shot       Relevant Orders   Flu Vaccine QUAD High Dose(Fluad) (Completed)      No orders of the defined types were placed in this encounter.   Follow-up: Return in  about 3 months (around 10/05/2020).   Cathlean Cower, MD 07/07/2020 9:52 AM [facility]

## 2020-07-10 ENCOUNTER — Encounter: Payer: Self-pay | Admitting: Internal Medicine

## 2020-07-10 NOTE — Assessment & Plan Note (Signed)
Lab Results  Component Value Date   HGBA1C 6.4 (A) 01/08/2020   Stable, pt to continue current medical treatment  - diet, and refer optho for yearly exam

## 2020-07-10 NOTE — Assessment & Plan Note (Signed)
BP Readings from Last 3 Encounters:  07/07/20 138/68  07/01/20 (!) 143/88  07/01/20 (S) (!) 143/76   Stable, pt to continue medical treatment amlodipine, hct, and losartan   Current Outpatient Medications (Cardiovascular):  .  amLODipine (NORVASC) 2.5 MG tablet, Take 2.5 mg by mouth daily. Marland Kitchen  atorvastatin (LIPITOR) 20 MG tablet, Take 1 tablet (20 mg total) by mouth daily. .  hydrochlorothiazide (HYDRODIURIL) 25 MG tablet, TAKE 1 TABLET(25 MG) BY MOUTH DAILY .  losartan (COZAAR) 100 MG tablet, Take 1 tablet (100 mg total) by mouth daily.  Current Outpatient Medications (Respiratory):  .  triamcinolone (NASACORT) 55 MCG/ACT AERO nasal inhaler, Place 2 sprays into the nose daily.  Current Outpatient Medications (Analgesics):  .  acetaminophen (TYLENOL) 500 MG tablet, Take 500 mg by mouth every 6 (six) hours as needed. Marland Kitchen  aspirin EC 81 MG tablet, Take 1 tablet (81 mg total) by mouth daily. .  meloxicam (MOBIC) 15 MG tablet, Take 1 tablet (15 mg total) by mouth daily as needed for pain.  Current Outpatient Medications (Hematological):  .  vitamin B-12 (CYANOCOBALAMIN) 1000 MCG tablet, Take 1 tablet (1,000 mcg total) by mouth daily.  Current Outpatient Medications (Other):  .  donepezil (ARICEPT) 10 MG tablet, Take 1 tablet (10 mg total) by mouth at bedtime. .  memantine (NAMENDA) 10 MG tablet, Take 1 tablet (10 mg total) by mouth 2 (two) times daily. Marland Kitchen  omeprazole (PRILOSEC) 40 MG capsule, Take 1 capsule (40 mg total) by mouth daily. .  tizanidine (ZANAFLEX) 2 MG capsule, Take 1 capsule (2 mg total) by mouth at bedtime as needed for muscle spasms. .  Vitamin D, Ergocalciferol, (DRISDOL) 1.25 MG (50000 UNIT) CAPS capsule, Take 1 capsule (50,000 Units total) by mouth every 7 (seven) days.

## 2020-07-10 NOTE — Assessment & Plan Note (Signed)
Also for ENT referral

## 2020-07-10 NOTE — Assessment & Plan Note (Signed)
Atypical, for stress test

## 2020-07-13 ENCOUNTER — Other Ambulatory Visit: Payer: Self-pay

## 2020-07-13 ENCOUNTER — Ambulatory Visit (INDEPENDENT_AMBULATORY_CARE_PROVIDER_SITE_OTHER): Payer: Medicare Other

## 2020-07-13 VITALS — BP 130/80 | HR 66 | Temp 98.3°F | Resp 16 | Ht 71.0 in | Wt 177.6 lb

## 2020-07-13 DIAGNOSIS — Z0000 Encounter for general adult medical examination without abnormal findings: Secondary | ICD-10-CM

## 2020-07-13 NOTE — Progress Notes (Signed)
Subjective:   Tobey Schmelzle is a 80 y.o. male who presents for Medicare Annual/Subsequent preventive examination.  Review of Systems    No ROS. Medicare Wellness Visit. Additional risk factors are reflected in social history. Cardiac Risk Factors include: advanced age (>68men, >68 women);dyslipidemia;family history of premature cardiovascular disease;hypertension;male gender     Objective:    Today's Vitals   07/13/20 0856  BP: 130/80  Pulse: 66  Resp: 16  Temp: 98.3 F (36.8 C)  SpO2: 99%  Weight: 177 lb 9.6 oz (80.6 kg)  Height: 5\' 11"  (1.803 m)  PainSc: 8    Body mass index is 24.77 kg/m.  Advanced Directives 07/13/2020 07/01/2020 06/21/2018 12/24/2017 10/11/2017 05/12/2017 05/11/2017  Does Patient Have a Medical Advance Directive? No No Yes No No No No  Type of Advance Directive - - [place]  Does patient want to make changes to medical advance directive? - - - - - - -  Copy of [place] in Chart? - - No - copy requested - - - -  Would patient like information on creating a medical advance directive? No - Patient declined No - Patient declined No - Patient declined No - Patient declined No - Patient declined - -  Pre-existing out of facility DNR order (yellow form or pink MOST form) - - - - - - -    Current Medications (verified) Outpatient Encounter Medications as of 07/13/2020  Medication Sig  . acetaminophen (TYLENOL) 500 MG tablet Take 500 mg by mouth every 6 (six) hours as needed.  Marland Kitchen amLODipine (NORVASC) 2.5 MG tablet Take 2.5 mg by mouth daily.  Marland Kitchen aspirin EC 81 MG tablet Take 1 tablet (81 mg total) by mouth daily.  Marland Kitchen atorvastatin (LIPITOR) 20 MG tablet Take 1 tablet (20 mg total) by mouth daily.  Marland Kitchen donepezil (ARICEPT) 10 MG tablet Take 1 tablet (10 mg total) by mouth at bedtime.  . hydrochlorothiazide (HYDRODIURIL) 25 MG tablet TAKE 1 TABLET(25 MG) BY MOUTH DAILY  . losartan (COZAAR) 100 MG tablet Take 1 tablet (100 mg  total) by mouth daily.  . meloxicam (MOBIC) 15 MG tablet Take 1 tablet (15 mg total) by mouth daily as needed for pain.  . memantine (NAMENDA) 10 MG tablet Take 1 tablet (10 mg total) by mouth 2 (two) times daily.  Marland Kitchen omeprazole (PRILOSEC) 40 MG capsule Take 1 capsule (40 mg total) by mouth daily.  . tizanidine (ZANAFLEX) 2 MG capsule Take 1 capsule (2 mg total) by mouth at bedtime as needed for muscle spasms.  Marland Kitchen triamcinolone (NASACORT) 55 MCG/ACT AERO nasal inhaler Place 2 sprays into the nose daily.  . vitamin B-12 (CYANOCOBALAMIN) 1000 MCG tablet Take 1 tablet (1,000 mcg total) by mouth daily.  . Vitamin D, Ergocalciferol, (DRISDOL) 1.25 MG (50000 UNIT) CAPS capsule Take 1 capsule (50,000 Units total) by mouth every 7 (seven) days.   No facility-administered encounter medications on file as of 07/13/2020.    Allergies (verified) Patient has no known allergies.   History: Past Medical History:  Diagnosis Date  . Arthritis   . Coronary artery disease, non-occlusive    a. 06/2011: cath for abnormal exercise echo: Only 30-40% proximal RCA. Otherwise normal. b. 10/2015: cath for abnormal NST and atypical CP --> showed mild 30-40% stenosis in the prox-distal RCA  . GERD (gastroesophageal reflux disease)   . HA (headache)   . Hypertension   . Memory loss   . Rectal  cancer ([place])   . TIA (transient ischemic attack)   . Weakness    Past Surgical History:  Procedure Laterality Date  . CARDIAC CATHETERIZATION  January 2013   Proximal RCA 30-40%. Otherwise normal.  . CARDIAC CATHETERIZATION N/A 10/18/2015   Procedure: Left Heart Cath and Coronary Angiography;  Surgeon: Jettie Booze, MD;  Location: [facility];  Service: Cardiovascular;  Laterality: N/A;  . NM MYOVIEW LTD  March 2015   LOW RISK. No scar or ischemia. Normal EF (55%). No RWMA  . surgical excision of rectal cancer     Family History  Problem Relation Age of Onset  . Hypertension Mother   . Cancer Mother   .  Liver disease Father    Social History   Socioeconomic History  . Marital status: Single    Spouse name: Not on file  . Number of children: 11  . Years of education: 19  . Highest education level: Not on file  Occupational History  . Occupation: Retried    Comment: retired  Tobacco Use  . Smoking status: Current Some Day Smoker    Packs/day: 0.50    Types: Cigarettes  . Smokeless tobacco: Never Used  Vaping Use  . Vaping Use: Never used  Substance and Sexual Activity  . Alcohol use: Yes    Comment: drinks "every now and then"  . Drug use: No  . Sexual activity: Not on file  Other Topics Concern  . Not on file  Social History Narrative   Patient is single, retired. Lives alone.   Patient is right handed   Education level is high school   Caffeine consumption is 1 cup daily   Social Determinants of Health   Financial Resource Strain: Low Risk   . Difficulty of Paying Living Expenses: Not hard at all  Food Insecurity: No Food Insecurity  . Worried About Charity fundraiser in the Last Year: Never true  . Ran Out of Food in the Last Year: Never true  Transportation Needs: No Transportation Needs  . Lack of Transportation (Medical): No  . Lack of Transportation (Non-Medical): No  Physical Activity: Sufficiently Active  . Days of Exercise per Week: 5 days  . Minutes of Exercise per Session: 30 min  Stress: No Stress Concern Present  . Feeling of Stress : Not at all  Social Connections: Moderately Integrated  . Frequency of Communication with Friends and Family: More than three times a week  . Frequency of Social Gatherings with Friends and Family: More than three times a week  . Attends Religious Services: 1 to 4 times per year  . Active Member of Clubs or Organizations: Yes  . Attends Archivist Meetings: More than 4 times per year  . Marital Status: Separated    Tobacco Counseling Ready to quit: Not Answered Counseling given: Not Answered   Clinical  Intake:  Pre-visit preparation completed: Yes  Pain : 0-10 Pain Score: 8  Pain Location: Head Pain Radiating Towards: right side temporal area Pain Descriptors / Indicators: Headache Pain Onset: Yesterday Pain Frequency: Occasional Pain Relieving Factors: Tylenol Effect of Pain on Daily Activities: Pain can lower motivation to exercise and prevent you from completing daily tasks.  Pain Relieving Factors: Tylenol  BMI - recorded: 24.77 Nutritional Status: BMI of 19-24  Normal Nutritional Risks: None Diabetes: Yes CBG done?: No Did pt. bring in CBG monitor from home?: No  How often do you need to have someone help you when  you read instructions, pamphlets, or other written materials from your doctor or pharmacy?: 1 - Never What is the last grade level you completed in school?: High School Graduate  Diabetic? yes  Interpreter Needed?: No  Information entered by :: Lisette Abu, LPN   Activities of Daily Living In your present state of health, do you have any difficulty performing the following activities: 07/13/2020 07/07/2020  Hearing? N N  Vision? N N  Difficulty concentrating or making decisions? N N  Walking or climbing stairs? N N  Dressing or bathing? N N  Doing errands, shopping? N N  Preparing Food and eating ? N -  Using the Toilet? N -  In the past six months, have you accidently leaked urine? N -  Do you have problems with loss of bowel control? N -  Managing your Medications? N -  Managing your Finances? N -  Housekeeping or managing your Housekeeping? N -  Some recent data might be hidden    Patient Care Team: Biagio Borg, MD as PCP - General (Internal Medicine)  Indicate any recent Medical Services you may have received from other than [facility] providers in the past year (date may be approximate).     Assessment:   This is a routine wellness examination for [place].  Hearing/Vision screen No exam data present  Dietary issues and exercise  activities discussed: Current Exercise Habits: Home exercise routine, Type of exercise: walking, Time (Minutes): 30, Frequency (Times/Week): 5, Weekly Exercise (Minutes/Week): 150, Intensity: Moderate, Exercise limited by: orthopedic condition(s);cardiac condition(s)  Goals   None    Depression Screen PHQ 2/9 Scores 07/13/2020 09/30/2019 08/07/2019 12/24/2017 05/10/2017 03/12/2017 07/11/2016  PHQ - 2 Score 0 1 0 0 1 0 0  PHQ- 9 Score - 2 - - - - -    Fall Risk Fall Risk  07/13/2020 10/07/2019 09/30/2019 08/07/2019 12/24/2017  Falls in the past year? 0 - 0 0 No  Number falls in past yr: 0 0 0 - -  Injury with Fall? 0 0 0 - -  Risk for fall due to : No Fall Risks No Fall Risks - - -  Follow up Falls evaluation completed Falls evaluation completed Falls evaluation completed - -    FALL RISK PREVENTION PERTAINING TO THE HOME:  Any stairs in or around the home? No  If so, are there any without handrails? No  Home free of loose throw rugs in walkways, pet beds, electrical cords, etc? Yes  Adequate lighting in your home to reduce risk of falls? Yes   ASSISTIVE DEVICES UTILIZED TO PREVENT FALLS:  Life alert? No  Use of a cane, walker or w/c? No  Grab bars in the bathroom? No  Shower chair or bench in shower? No  Elevated toilet seat or a handicapped toilet? No   TIMED UP AND GO:  Was the test performed? No .  Length of time to ambulate 10 feet: 0 sec.   Gait steady and fast without use of assistive device  Cognitive Function: MMSE - Mini Mental State Exam 06/24/2020 12/17/2019  Orientation to time 5 5  Orientation to Place 4 5  Registration 3 3  Attention/ Calculation 5 3  Recall 1 0  Language- name 2 objects 2 2  Language- repeat 1 1  Language- follow 3 step command 3 3  Language- read & follow direction 1 1  Write a sentence 0 1  Copy design 1 1  Total score 26 25  Immunizations Immunization History  Administered Date(s) Administered  . Fluad Quad(high Dose 65+)  07/07/2020  . Influenza,inj,Quad PF,6+ Mos 12/17/2014  . Influenza-Unspecified 05/12/2014  . PFIZER(Purple Top)SARS-COV-2 Vaccination 08/14/2019, 09/16/2019    TDAP status: Due, Education has been provided regarding the importance of this vaccine. Advised may receive this vaccine at local pharmacy or Health Dept. Aware to provide a copy of the vaccination record if obtained from local pharmacy or Health Dept. Verbalized acceptance and understanding.  Flu Vaccine status: Up to date  Pneumococcal vaccine status: Due, Education has been provided regarding the importance of this vaccine. Advised may receive this vaccine at local pharmacy or Health Dept. Aware to provide a copy of the vaccination record if obtained from local pharmacy or Health Dept. Verbalized acceptance and understanding.  Covid-19 vaccine status: Completed vaccines  Qualifies for Shingles Vaccine? Yes   Zostavax completed No   Shingrix Completed?: No.    Education has been provided regarding the importance of this vaccine. Patient has been advised to call insurance company to determine out of pocket expense if they have not yet received this vaccine. Advised may also receive vaccine at local pharmacy or Health Dept. Verbalized acceptance and understanding.  Screening Tests Health Maintenance  Topic Date Due  . Hepatitis C Screening  Never done  . OPHTHALMOLOGY EXAM  Never done  . HEMOGLOBIN A1C  07/10/2020  . COVID-19 Vaccine (3 - Pfizer risk 4-dose series) 07/23/2020 (Originally 10/14/2019)  . TETANUS/TDAP  08/06/2020 (Originally 12/27/1959)  . PNA vac Low Risk Adult (1 of 2 - PCV13) 07/07/2021 (Originally 12/28/2006)  . FOOT EXAM  08/06/2020  . INFLUENZA VACCINE  Completed    Health Maintenance  Health Maintenance Due  Topic Date Due  . Hepatitis C Screening  Never done  . OPHTHALMOLOGY EXAM  Never done  . HEMOGLOBIN A1C  07/10/2020    Colorectal cancer screening: No longer required.   Lung Cancer Screening:  (Low Dose CT Chest recommended if Age 32-80 years, 30 pack-year currently smoking OR have quit w/in 15years.) does qualify.   Lung Cancer Screening Referral: no  Additional Screening:  Hepatitis C Screening: does not qualify; Completed no  Vision Screening: Recommended annual ophthalmology exams for early detection of glaucoma and other disorders of the eye. Is the patient up to date with their annual eye exam?  Yes  Who is the provider or what is the name of the office in which the patient attends annual eye exams? [facility]  If pt is not established with a provider, would they like to be referred to a provider to establish care? No .   Dental Screening: Recommended annual dental exams for proper oral hygiene  Community Resource Referral / Chronic Care Management: CRR required this visit?  No   CCM required this visit?  No      Plan:     I have personally reviewed and noted the following in the patient's chart:   . Medical and social history . Use of alcohol, tobacco or illicit drugs  . Current medications and supplements . Functional ability and status . Nutritional status . Physical activity . Advanced directives . List of other physicians . Hospitalizations, surgeries, and ER visits in previous 12 months . Vitals . Screenings to include cognitive, depression, and falls . Referrals and appointments  In addition, I have reviewed and discussed with patient certain preventive protocols, quality metrics, and best practice recommendations. A written personalized care plan for preventive services as well as general  preventive health recommendations were provided to patient.     Sheral Flow, LPN   [id number]/[id number]   Nurse Notes: n/a

## 2020-07-13 NOTE — Patient Instructions (Addendum)
Andre [name] , Thank you for taking time to come for your Medicare Wellness Visit. I appreciate your ongoing commitment to your health goals. Please review the following plan we discussed and let me know if I can assist you in the future.   Screening recommendations/referrals: Colonoscopy: no repeat due to age Recommended yearly ophthalmology/optometry visit for glaucoma screening and checkup Recommended yearly dental visit for hygiene and checkup  Vaccinations: Influenza vaccine: 07/07/2020 Pneumococcal vaccine: 05/06/2014; need Prevnar Tdap vaccine: overdue Shingles vaccine: never done   Covid-19: up to date  Advanced directives: Please bring a copy of your health care power of attorney and living will to the office at your convenience.  Conditions/risks identified: Yes; Reviewed health maintenance screenings with patient today and relevant education, vaccines, and/or referrals were provided. Please continue to do your personal lifestyle choices by: daily care of teeth and gums, regular physical activity (goal should be 5 days a week for 30 minutes), eat a healthy diet, avoid tobacco and drug use, limiting any alcohol intake, taking a low-dose aspirin (if not allergic or have been advised by your provider otherwise) and taking vitamins and minerals as recommended by your provider. Continue doing brain stimulating activities (puzzles, reading, adult coloring books, staying active) to keep memory sharp. Continue to eat heart healthy diet (full of fruits, vegetables, whole grains, lean protein, water--limit salt, fat, and sugar intake) and increase physical activity as tolerated.  Next appointment: Please schedule your next Medicare Wellness Visit with your Nurse Health Advisor in 1 year by calling [phone].  Preventive Care 35 Years and Older, Male Preventive care refers to lifestyle choices and visits with your health care provider that can promote health and wellness. What does preventive  care include?  A yearly physical exam. This is also called an annual well check.  Dental exams once or twice a year.  Routine eye exams. Ask your health care provider how often you should have your eyes checked.  Personal lifestyle choices, including:  Daily care of your teeth and gums.  Regular physical activity.  Eating a healthy diet.  Avoiding tobacco and drug use.  Limiting alcohol use.  Practicing safe sex.  Taking low doses of aspirin every day.  Taking vitamin and mineral supplements as recommended by your health care provider. What happens during an annual well check? The services and screenings done by your health care provider during your annual well check will depend on your age, overall health, lifestyle risk factors, and family history of disease. Counseling  Your health care provider may ask you questions about your:  Alcohol use.  Tobacco use.  Drug use.  Emotional well-being.  Home and relationship well-being.  Sexual activity.  Eating habits.  History of falls.  Memory and ability to understand (cognition).  Work and work Statistician. Screening  You may have the following tests or measurements:  Height, weight, and BMI.  Blood pressure.  Lipid and cholesterol levels. These may be checked every 5 years, or more frequently if you are over 11 years old.  Skin check.  Lung cancer screening. You may have this screening every year starting at age 68 if you have a 30-pack-year history of smoking and currently smoke or have quit within the past 15 years.  Fecal occult blood test (FOBT) of the stool. You may have this test every year starting at age 68.  Flexible sigmoidoscopy or colonoscopy. You may have a sigmoidoscopy every 5 years or a colonoscopy every 10 years starting at age 59.  Prostate cancer screening. Recommendations will vary depending on your family history and other risks.  Hepatitis C blood test.  Hepatitis B blood  test.  Sexually transmitted disease (STD) testing.  Diabetes screening. This is done by checking your blood sugar (glucose) after you have not eaten for a while (fasting). You may have this done every 1-3 years.  Abdominal aortic aneurysm (AAA) screening. You may need this if you are a current or former smoker.  Osteoporosis. You may be screened starting at age 2 if you are at high risk. Talk with your health care provider about your test results, treatment options, and if necessary, the need for more tests. Vaccines  Your health care provider may recommend certain vaccines, such as:  Influenza vaccine. This is recommended every year.  Tetanus, diphtheria, and acellular pertussis (Tdap, Td) vaccine. You may need a Td booster every 10 years.  Zoster vaccine. You may need this after age 77.  Pneumococcal 13-valent conjugate (PCV13) vaccine. One dose is recommended after age 12.  Pneumococcal polysaccharide (PPSV23) vaccine. One dose is recommended after age 68. Talk to your health care provider about which screenings and vaccines you need and how often you need them. This information is not intended to replace advice given to you by your health care provider. Make sure you discuss any questions you have with your health care provider. Document Released: 06/25/2015 Document Revised: 02/16/2016 Document Reviewed: 03/30/2015 Elsevier Interactive Patient Education  2017 [place] Prevention in the Home Falls can cause injuries. They can happen to people of all ages. There are many things you can do to make your home safe and to help prevent falls. What can I do on the outside of my home?  Regularly fix the edges of walkways and driveways and fix any cracks.  Remove anything that might make you trip as you walk through a door, such as a raised step or threshold.  Trim any bushes or trees on the path to your home.  Use bright outdoor lighting.  Clear any walking paths of  anything that might make someone trip, such as rocks or tools.  Regularly check to see if handrails are loose or broken. Make sure that both sides of any steps have handrails.  Any raised decks and porches should have guardrails on the edges.  Have any leaves, snow, or ice cleared regularly.  Use sand or salt on walking paths during winter.  Clean up any spills in your garage right away. This includes oil or grease spills. What can I do in the bathroom?  Use night lights.  Install grab bars by the toilet and in the tub and shower. Do not use towel bars as grab bars.  Use non-skid mats or decals in the tub or shower.  If you need to sit down in the shower, use a plastic, non-slip stool.  Keep the floor dry. Clean up any water that spills on the floor as soon as it happens.  Remove soap buildup in the tub or shower regularly.  Attach bath mats securely with double-sided non-slip rug tape.  Do not have throw rugs and other things on the floor that can make you trip. What can I do in the bedroom?  Use night lights.  Make sure that you have a light by your bed that is easy to reach.  Do not use any sheets or blankets that are too big for your bed. They should not hang down onto the floor.  Have  a firm chair that has side arms. You can use this for support while you get dressed.  Do not have throw rugs and other things on the floor that can make you trip. What can I do in the kitchen?  Clean up any spills right away.  Avoid walking on wet floors.  Keep items that you use a lot in easy-to-reach places.  If you need to reach something above you, use a strong step stool that has a grab bar.  Keep electrical cords out of the way.  Do not use floor polish or wax that makes floors slippery. If you must use wax, use non-skid floor wax.  Do not have throw rugs and other things on the floor that can make you trip. What can I do with my stairs?  Do not leave any items on the  stairs.  Make sure that there are handrails on both sides of the stairs and use them. Fix handrails that are broken or loose. Make sure that handrails are as long as the stairways.  Check any carpeting to make sure that it is firmly attached to the stairs. Fix any carpet that is loose or worn.  Avoid having throw rugs at the top or bottom of the stairs. If you do have throw rugs, attach them to the floor with carpet tape.  Make sure that you have a light switch at the top of the stairs and the bottom of the stairs. If you do not have them, ask someone to add them for you. What else can I do to help prevent falls?  Wear shoes that:  Do not have high heels.  Have rubber bottoms.  Are comfortable and fit you well.  Are closed at the toe. Do not wear sandals.  If you use a stepladder:  Make sure that it is fully opened. Do not climb a closed stepladder.  Make sure that both sides of the stepladder are locked into place.  Ask someone to hold it for you, if possible.  Clearly mark and make sure that you can see:  Any grab bars or handrails.  First and last steps.  Where the edge of each step is.  Use tools that help you move around (mobility aids) if they are needed. These include:  Canes.  Walkers.  Scooters.  Crutches.  Turn on the lights when you go into a dark area. Replace any light bulbs as soon as they burn out.  Set up your furniture so you have a clear path. Avoid moving your furniture around.  If any of your floors are uneven, fix them.  If there are any pets around you, be aware of where they are.  Review your medicines with your doctor. Some medicines can make you feel dizzy. This can increase your chance of falling. Ask your doctor what other things that you can do to help prevent falls. This information is not intended to replace advice given to you by your health care provider. Make sure you discuss any questions you have with your health care  provider. Document Released: 03/25/2009 Document Revised: 11/04/2015 Document Reviewed: 07/03/2014 Elsevier Interactive Patient Education  2017 [employer].

## 2020-07-14 ENCOUNTER — Encounter: Payer: Self-pay | Admitting: Internal Medicine

## 2020-07-14 ENCOUNTER — Telehealth: Payer: Self-pay

## 2020-07-14 ENCOUNTER — Telehealth: Payer: Self-pay | Admitting: Internal Medicine

## 2020-07-14 NOTE — Telephone Encounter (Signed)
Pt would like you to give him a call to discuss some headaches he's been having. No sure if this was discussed in his recent visit.

## 2020-07-14 NOTE — Telephone Encounter (Signed)
I dont really have anything else to offer, unless he wants a referral to neurology

## 2020-07-15 ENCOUNTER — Emergency Department (HOSPITAL_COMMUNITY)
Admission: EM | Admit: 2020-07-15 | Discharge: 2020-07-15 | Disposition: A | Discharge status: Home / Self Care | Payer: Medicare Other | Attending: Emergency Medicine | Admitting: Emergency Medicine

## 2020-07-15 ENCOUNTER — Encounter (HOSPITAL_COMMUNITY): Payer: Self-pay | Admitting: *Deleted

## 2020-07-15 ENCOUNTER — Emergency Department (HOSPITAL_COMMUNITY): Payer: Medicare Other

## 2020-07-15 ENCOUNTER — Other Ambulatory Visit: Payer: Self-pay

## 2020-07-15 DIAGNOSIS — Z85048 Personal history of other malignant neoplasm of rectum, rectosigmoid junction, and anus: Secondary | ICD-10-CM | POA: Diagnosis not present

## 2020-07-15 DIAGNOSIS — Z8673 Personal history of transient ischemic attack (TIA), and cerebral infarction without residual deficits: Secondary | ICD-10-CM | POA: Diagnosis not present

## 2020-07-15 DIAGNOSIS — I2510 Atherosclerotic heart disease of native coronary artery without angina pectoris: Secondary | ICD-10-CM | POA: Insufficient documentation

## 2020-07-15 DIAGNOSIS — R519 Headache, unspecified: Secondary | ICD-10-CM | POA: Diagnosis not present

## 2020-07-15 DIAGNOSIS — Z7982 Long term (current) use of aspirin: Secondary | ICD-10-CM | POA: Insufficient documentation

## 2020-07-15 DIAGNOSIS — F17210 Nicotine dependence, cigarettes, uncomplicated: Secondary | ICD-10-CM | POA: Diagnosis not present

## 2020-07-15 DIAGNOSIS — J449 Chronic obstructive pulmonary disease, unspecified: Secondary | ICD-10-CM | POA: Diagnosis not present

## 2020-07-15 DIAGNOSIS — Z79899 Other long term (current) drug therapy: Secondary | ICD-10-CM | POA: Diagnosis not present

## 2020-07-15 DIAGNOSIS — I10 Essential (primary) hypertension: Secondary | ICD-10-CM | POA: Diagnosis not present

## 2020-07-15 LAB — SEDIMENTATION RATE: Sed Rate: 0 mm/hr (ref 0–16)

## 2020-07-15 LAB — BASIC METABOLIC PANEL
Anion gap: 8 (ref 5–15)
BUN: <5 mg/dL — ABNORMAL LOW (ref 8–23)
CO2: 28 mmol/L (ref 22–32)
Calcium: 9.3 mg/dL (ref 8.9–10.3)
Chloride: 100 mmol/L (ref 98–111)
Creatinine, Ser: 0.82 mg/dL (ref 0.61–1.24)
GFR, Estimated: >60 mL/min (ref >60)
Glucose, Bld: 114 mg/dL — ABNORMAL HIGH (ref 70–99)
Potassium: 3.7 mmol/L (ref 3.5–5.1)
Sodium: 136 mmol/L (ref 135–145)

## 2020-07-15 LAB — CBC WITH DIFFERENTIAL/PLATELET
Abs Immature Granulocytes: 0.02 10*3/uL (ref 0.00–0.07)
Basophils Absolute: 0 10*3/uL (ref 0.0–0.1)
Basophils Relative: 1 %
Eosinophils Absolute: 0.4 10*3/uL (ref 0.0–0.5)
Eosinophils Relative: 7 %
HCT: 48.4 % (ref 39.0–52.0)
Hemoglobin: 15.8 g/dL (ref 13.0–17.0)
Immature Granulocytes: 0 %
Lymphocytes Relative: 40 %
Lymphs Abs: 2.4 10*3/uL (ref 0.7–4.0)
MCH: 31.7 pg (ref 26.0–34.0)
MCHC: 32.6 g/dL (ref 30.0–36.0)
MCV: 97.2 fL (ref 80.0–100.0)
Monocytes Absolute: 0.5 10*3/uL (ref 0.1–1.0)
Monocytes Relative: 8 %
Neutro Abs: 2.7 10*3/uL (ref 1.7–7.7)
Neutrophils Relative %: 44 %
Platelets: 230 10*3/uL (ref 150–400)
RBC: 4.98 MIL/uL (ref 4.22–5.81)
RDW: 14.1 % (ref 11.5–15.5)
WBC: 6.1 10*3/uL (ref 4.0–10.5)
nRBC: 0 % (ref 0.0–0.2)

## 2020-07-15 LAB — CK: Total CK: 141 U/L (ref 49–397)

## 2020-07-15 MED ORDER — BUTALBITAL-APAP-CAFFEINE 50-325-40 MG PO TABS
1.0000 | ORAL_TABLET | Freq: Once | ORAL | Status: AC
  Administered 2020-07-15: 1 via ORAL
  Filled 2020-07-15: qty 1

## 2020-07-15 MED ORDER — SODIUM CHLORIDE 0.9 % IV BOLUS
500.0000 mL | Freq: Once | INTRAVENOUS | Status: AC
  Administered 2020-07-15: 500 mL via INTRAVENOUS

## 2020-07-15 MED ORDER — ACETAMINOPHEN 325 MG PO TABS
650.0000 mg | ORAL_TABLET | Freq: Once | ORAL | Status: AC
  Administered 2020-07-15: 650 mg via ORAL
  Filled 2020-07-15: qty 2

## 2020-07-15 NOTE — Discharge Instructions (Signed)
If you develop continued, recurrent, or worsening headache, fever, neck stiffness, vomiting, blurry or double vision, weakness or numbness in your arms or legs, trouble speaking, or any other new/concerning symptoms then return to the ER for evaluation.  

## 2020-07-15 NOTE — ED Triage Notes (Signed)
The pt is c/o a rt sided headache pain both shoulders and both legs for over a week he has been seen heere for the same and he has seen his doctor he reports that he has not been told what is  wrong

## 2020-07-15 NOTE — ED Provider Notes (Signed)
[facility] EMERGENCY DEPARTMENT Provider Note   CSN: [id number] Arrival date & time: 07/15/20  8469     History Chief Complaint  Patient presents with  . Headache    Andre [name] is a 80 y.o. male.  HPI 80 year old male presents with a right-sided headache.  The patient states this has been ongoing for at least 2 weeks.  It is sharp and intermittent.  Nothing specific brings it on.  Started again around 5 AM this morning.  It is about a 7 out of 10.  No neck pain though he is having bilateral trapezius pain that has also been going on for 1-2 weeks.  No trauma.  At night he he gets some cramps in his legs.  There is no visual complaints or eye pain.  No chest pain or shortness of breath.  No weakness or numbness.   Past Medical History:  Diagnosis Date  . Arthritis   . Coronary artery disease, non-occlusive    a. 06/2011: cath for abnormal exercise echo: Only 30-40% proximal RCA. Otherwise normal. b. 10/2015: cath for abnormal NST and atypical CP --> showed mild 30-40% stenosis in the prox-distal RCA  . GERD (gastroesophageal reflux disease)   . HA (headache)   . Hypertension   . Memory loss   . 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[place])   . TIA (transient ischemic attack)   . Weakness     Patient Active Problem List   Diagnosis Date Noted  . Left shoulder pain 02/11/2020  . Right shoulder pain 02/11/2020  . Vitamin B12 deficiency 12/17/2019  . Mild cognitive impairment 12/17/2019  . Peripheral neuropathy 12/17/2019  . Vitamin D deficiency 12/01/2019  . Patellar tendonitis of left knee 12/01/2019  . Allergic rhinitis 10/07/2019  . Dementia ([place]) 10/07/2019  . CAD (coronary artery disease) 09/30/2019  . Amnesia 09/10/2019  . Right-sided nosebleed 08/07/2019  . Encounter for well adult exam with abnormal findings 08/07/2019  . Leg cramping 08/07/2019  . Chest pain 03/23/2019  . Epistaxis 03/23/2019  . Hyperlipidemia 12/24/2017  . Alcohol use 12/24/2017  . Non-compliance  12/24/2017  . 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 Left lumbar radiculopathy 08/02/2017  . Atherosclerosis 06/03/2017  . Low-level of literacy 05/12/2017  . Osteoarthritis 04/20/2017  . Diabetes ([place]) 03/12/2017  . Back pain 02/29/2016  . Abnormal stress test 10/18/2015  . Erectile dysfunction 02/25/2015  . Cigarette smoker 02/10/2015  . COPD GOLD 0 11/08/2014  . Hemorrhoids 02/11/2014  . 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 Headache 10/23/2011  . 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[place]) 10/23/2011  . GERD (gastroesophageal reflux disease) 10/23/2011  . Nicotine addiction 10/23/2011  . 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 Cervical stenosis of spinal canal 10/23/2011  . Essential hypertension, benign 07/10/2011    Past Surgical History:  Procedure Laterality Date  . CARDIAC CATHETERIZATION  January 2013   Proximal RCA 30-40%. Otherwise normal.  . CARDIAC CATHETERIZATION N/A 10/18/2015   Procedure: Left Heart Cath and Coronary Angiography;  Surgeon: Jettie Booze, MD;  Location: [facility];  Service: Cardiovascular;  Laterality: N/A;  . NM MYOVIEW LTD  March 2015   LOW RISK. No scar or ischemia. Normal EF (55%). No RWMA  . surgical excision of rectal cancer         Family History  Problem Relation Age of Onset  . Hypertension Mother   . Cancer Mother   . Liver disease Father     Social History   Tobacco Use  .  Smoking status: Current Some Day Smoker    Packs/day: 0.50    Types: Cigarettes  . Smokeless tobacco: Never Used  Vaping Use  . Vaping Use: Never used  Substance Use Topics  . Alcohol use: Yes    Comment: drinks "every now and then"  . Drug use: No    Home Medications Prior to Admission medications   Medication Sig Start Date End Date Taking? Authorizing Provider  acetaminophen (TYLENOL) 500 MG tablet Take 500 mg by mouth every 6 (six) hours as needed.    [provider]  amLODipine (NORVASC) 2.5 MG tablet Take 2.5 mg by mouth daily.  10/30/19   [provider]  aspirin EC 81 MG tablet Take 1 tablet (81 mg total) by mouth daily. 03/24/19   Binnie Rail, MD  atorvastatin (LIPITOR) 20 MG tablet Take 1 tablet (20 mg total) by mouth daily. 08/07/19   Biagio Borg, MD  donepezil (ARICEPT) 10 MG tablet Take 1 tablet (10 mg total) by mouth at bedtime. 12/17/19   Marcial Pacas, MD  hydrochlorothiazide (HYDRODIURIL) 25 MG tablet TAKE 1 TABLET(25 MG) BY MOUTH DAILY 03/03/20   Biagio Borg, MD  losartan (COZAAR) 100 MG tablet Take 1 tablet (100 mg total) by mouth daily. 08/07/19   Biagio Borg, MD  meloxicam (MOBIC) 15 MG tablet Take 1 tablet (15 mg total) by mouth daily as needed for pain. 02/11/20   Biagio Borg, MD  memantine (NAMENDA) 10 MG tablet Take 1 tablet (10 mg total) by mouth 2 (two) times daily. 12/17/19   Marcial Pacas, MD  omeprazole (PRILOSEC) 40 MG capsule Take 1 capsule (40 mg total) by mouth daily. 09/10/19   Binnie Rail, MD  tizanidine (ZANAFLEX) 2 MG capsule Take 1 capsule (2 mg total) by mouth at bedtime as needed for muscle spasms. 08/07/19   Biagio Borg, MD  triamcinolone (NASACORT) 55 MCG/ACT AERO nasal inhaler Place 2 sprays into the nose daily. 10/07/19   Biagio Borg, MD  vitamin B-12 (CYANOCOBALAMIN) 1000 MCG tablet Take 1 tablet (1,000 mcg total) by mouth daily. 12/01/19   Biagio Borg, MD  Vitamin D, Ergocalciferol, (DRISDOL) 1.25 MG (50000 UNIT) CAPS capsule Take 1 capsule (50,000 Units total) by mouth every 7 (seven) days. 06/30/20   Suzzanne Cloud, NP    Allergies    Patient has no known allergies.  Review of Systems   Review of Systems  Constitutional: Negative for fever.  Eyes: Negative for pain and visual disturbance.  Respiratory: Negative for shortness of breath.   Cardiovascular: Negative for chest pain.  Musculoskeletal: Positive for myalgias. Negative for neck pain.  Neurological: Positive for headaches. Negative for weakness and numbness.  All other systems reviewed and are  negative.   Physical Exam Updated Vital Signs BP 128/81   Pulse 85   Temp 97.7 F (36.5 C) (Oral)   Resp 18   Ht 5\' 11"  (1.803 m)   Wt 80.6 kg   SpO2 100%   BMI 24.78 kg/m   Physical Exam Vitals and nursing note reviewed.  Constitutional:      Appearance: He is well-developed and well-nourished.  HENT:     Head: Normocephalic and atraumatic.     Comments: Mild right temporal tenderness    Right Ear: External ear normal.     Left Ear: External ear normal.     Nose: Nose normal.  Eyes:     General:  Right eye: No discharge.        Left eye: No discharge.     Extraocular Movements: Extraocular movements intact.     Pupils: Pupils are equal, round, and reactive to light.  Neck:     Comments: Mild bilateral trapezius tenderness Cardiovascular:     Rate and Rhythm: Normal rate and regular rhythm.     Heart sounds: Normal heart sounds.  Pulmonary:     Effort: Pulmonary effort is normal.     Breath sounds: Normal breath sounds.  Abdominal:     Palpations: Abdomen is soft.     Tenderness: There is no abdominal tenderness.  Musculoskeletal:        General: No edema.     Cervical back: Normal range of motion and neck supple. No spinous process tenderness or muscular tenderness.  Skin:    General: Skin is warm and dry.  Neurological:     Mental Status: He is alert.     Comments: CN 3-12 grossly intact. 5/5 strength in all 4 extremities. Grossly normal sensation. Normal finger to nose.   Psychiatric:        Mood and Affect: Mood is not anxious.     ED Results / Procedures / Treatments   Labs (all labs ordered are listed, but only abnormal results are displayed) Labs Reviewed  BASIC METABOLIC PANEL - Abnormal; Notable for the following components:      Result Value   Glucose, Bld 114 (*)    BUN <5 (*)    All other components within normal limits  CBC WITH DIFFERENTIAL/PLATELET  SEDIMENTATION RATE  CK    EKG EKG Interpretation  Date/Time:  Thursday  July 15 2020 09:04:22 EST Ventricular Rate:  67 PR Interval:    QRS Duration: 110 QT Interval:  440 QTC Calculation: 465 R Axis:   -14 Text Interpretation: Sinus rhythm Multiple premature complexes, vent & supraven Abnormal R-wave progression, late transition Borderline T abnormalities, anterior leads Confirmed by Sherwood Gambler [phone]) on 07/15/2020 9:43:17 AM   Radiology CT Head Wo Contrast  Result Date: 07/15/2020 CLINICAL DATA:  Headache. EXAM: CT HEAD WITHOUT CONTRAST TECHNIQUE: Contiguous axial images were obtained from the base of the skull through the vertex without intravenous contrast. COMPARISON:  September 11, 2019. FINDINGS: Brain: Mild chronic ischemic white matter disease is noted. No mass effect or midline shift is noted. Ventricular size is within normal limits. There is no evidence of mass lesion, hemorrhage or acute infarction. Vascular: No hyperdense vessel or unexpected calcification. Skull: Normal. Negative for fracture or focal lesion. Sinuses/Orbits: No acute finding. Other: None. IMPRESSION: Mild chronic ischemic white matter disease. No acute intracranial abnormality seen. Electronically Signed   By: Marijo Conception M.D.   On: 07/15/2020 09:44    Procedures Procedures   Medications Ordered in ED Medications  butalbital-acetaminophen-caffeine (FIORICET) 50-325-40 MG per tablet 1 tablet (1 tablet Oral Given 07/15/20 1028)  acetaminophen (TYLENOL) tablet 650 mg (650 mg Oral Given 07/15/20 1028)  sodium chloride 0.9 % bolus 500 mL (500 mLs Intravenous New Bag/Given 07/15/20 1030)    ED Course  I have reviewed the triage vital signs and the nursing notes.  Pertinent labs & imaging results that were available during my care of the patient were reviewed by me and considered in my medical decision making (see chart for details).    MDM Rules/Calculators/A&P  Patient is well-appearing.  Neuro exam is unremarkable.  No meningismus.  CT head has been  reviewed and is unremarkable.  Labs are also unremarkable including normal WBC, normal sed rate, normal CK.  Unclear why he is having this headache though chart review does show he has a significant history of headaches.  However with his cancer history I did want to make sure CT did not show obvious mass.  I do not think emergent MRI is indicated but he will need to follow-up with his PCP.  At this point I highly doubt CNS infection, head bleed, aneurysm, etc. Final Clinical Impression(s) / ED Diagnoses Final diagnoses:  Right-sided headache    Rx / DC Orders ED Discharge Orders    None       Sherwood Gambler, MD 07/15/20 1110

## 2020-07-15 NOTE — Telephone Encounter (Signed)
Called to notify pt however vc box has not been set up yet

## 2020-07-16 ENCOUNTER — Telehealth (HOSPITAL_COMMUNITY): Payer: Self-pay | Admitting: *Deleted

## 2020-07-16 ENCOUNTER — Inpatient Hospital Stay (HOSPITAL_COMMUNITY): Admission: RE | Admit: 2020-07-16 | Payer: Medicare Other | Source: Ambulatory Visit

## 2020-07-16 NOTE — Telephone Encounter (Signed)
Close encounter 

## 2020-07-20 ENCOUNTER — Ambulatory Visit (HOSPITAL_COMMUNITY)
Admission: RE | Admit: 2020-07-20 | Payer: Medicare Other | Source: Ambulatory Visit | Attending: Internal Medicine | Admitting: Internal Medicine

## 2020-08-05 ENCOUNTER — Emergency Department (HOSPITAL_COMMUNITY)
Admission: EM | Admit: 2020-08-05 | Discharge: 2020-08-05 | Disposition: A | Discharge status: Home / Self Care | Payer: Medicare Other | Attending: Emergency Medicine | Admitting: Emergency Medicine

## 2020-08-05 ENCOUNTER — Emergency Department (HOSPITAL_COMMUNITY): Payer: Medicare Other

## 2020-08-05 ENCOUNTER — Other Ambulatory Visit: Payer: Self-pay

## 2020-08-05 DIAGNOSIS — Z7982 Long term (current) use of aspirin: Secondary | ICD-10-CM | POA: Diagnosis not present

## 2020-08-05 DIAGNOSIS — I499 Cardiac arrhythmia, unspecified: Secondary | ICD-10-CM | POA: Diagnosis not present

## 2020-08-05 DIAGNOSIS — F17210 Nicotine dependence, cigarettes, uncomplicated: Secondary | ICD-10-CM | POA: Diagnosis not present

## 2020-08-05 DIAGNOSIS — R1030 Lower abdominal pain, unspecified: Secondary | ICD-10-CM | POA: Diagnosis not present

## 2020-08-05 DIAGNOSIS — I2510 Atherosclerotic heart disease of native coronary artery without angina pectoris: Secondary | ICD-10-CM | POA: Insufficient documentation

## 2020-08-05 DIAGNOSIS — R109 Unspecified abdominal pain: Secondary | ICD-10-CM | POA: Diagnosis not present

## 2020-08-05 DIAGNOSIS — R6889 Other general symptoms and signs: Secondary | ICD-10-CM | POA: Diagnosis not present

## 2020-08-05 DIAGNOSIS — R079 Chest pain, unspecified: Secondary | ICD-10-CM | POA: Diagnosis not present

## 2020-08-05 DIAGNOSIS — Z79899 Other long term (current) drug therapy: Secondary | ICD-10-CM | POA: Diagnosis not present

## 2020-08-05 DIAGNOSIS — K5900 Constipation, unspecified: Secondary | ICD-10-CM | POA: Diagnosis not present

## 2020-08-05 DIAGNOSIS — Z743 Need for continuous supervision: Secondary | ICD-10-CM | POA: Diagnosis not present

## 2020-08-05 DIAGNOSIS — I10 Essential (primary) hypertension: Secondary | ICD-10-CM | POA: Diagnosis not present

## 2020-08-05 LAB — CBC WITH DIFFERENTIAL/PLATELET
Abs Immature Granulocytes: 0.03 10*3/uL (ref 0.00–0.07)
Basophils Absolute: 0 10*3/uL (ref 0.0–0.1)
Basophils Relative: 0 %
Eosinophils Absolute: 0.6 10*3/uL — ABNORMAL HIGH (ref 0.0–0.5)
Eosinophils Relative: 9 %
HCT: 48.4 % (ref 39.0–52.0)
Hemoglobin: 16.1 g/dL (ref 13.0–17.0)
Immature Granulocytes: 0 %
Lymphocytes Relative: 33 %
Lymphs Abs: 2.3 10*3/uL (ref 0.7–4.0)
MCH: 31.8 pg (ref 26.0–34.0)
MCHC: 33.3 g/dL (ref 30.0–36.0)
MCV: 95.5 fL (ref 80.0–100.0)
Monocytes Absolute: 0.6 10*3/uL (ref 0.1–1.0)
Monocytes Relative: 9 %
Neutro Abs: 3.4 10*3/uL (ref 1.7–7.7)
Neutrophils Relative %: 49 %
Platelets: 216 10*3/uL (ref 150–400)
RBC: 5.07 MIL/uL (ref 4.22–5.81)
RDW: 14.1 % (ref 11.5–15.5)
WBC: 6.9 10*3/uL (ref 4.0–10.5)
nRBC: 0 % (ref 0.0–0.2)

## 2020-08-05 LAB — URINALYSIS, ROUTINE W REFLEX MICROSCOPIC
Bilirubin Urine: NEGATIVE
Glucose, UA: NEGATIVE mg/dL
Hgb urine dipstick: NEGATIVE
Ketones, ur: NEGATIVE mg/dL
Leukocytes,Ua: NEGATIVE
Nitrite: NEGATIVE
Protein, ur: NEGATIVE mg/dL
Specific Gravity, Urine: 1.014 (ref 1.005–1.030)
pH: 8 (ref 5.0–8.0)

## 2020-08-05 LAB — LIPASE, BLOOD: Lipase: 29 U/L (ref 11–51)

## 2020-08-05 LAB — COMPREHENSIVE METABOLIC PANEL
ALT: 20 U/L (ref 0–44)
AST: 32 U/L (ref 15–41)
Albumin: 4 g/dL (ref 3.5–5.0)
Alkaline Phosphatase: 70 U/L (ref 38–126)
Anion gap: 13 (ref 5–15)
BUN: 6 mg/dL — ABNORMAL LOW (ref 8–23)
CO2: 26 mmol/L (ref 22–32)
Calcium: 9.5 mg/dL (ref 8.9–10.3)
Chloride: 98 mmol/L (ref 98–111)
Creatinine, Ser: 0.8 mg/dL (ref 0.61–1.24)
GFR, Estimated: >60 mL/min (ref >60)
Glucose, Bld: 124 mg/dL — ABNORMAL HIGH (ref 70–99)
Potassium: 3.2 mmol/L — ABNORMAL LOW (ref 3.5–5.1)
Sodium: 137 mmol/L (ref 135–145)
Total Bilirubin: 1.6 mg/dL — ABNORMAL HIGH (ref 0.3–1.2)
Total Protein: 7 g/dL (ref 6.5–8.1)

## 2020-08-05 NOTE — ED Provider Notes (Signed)
[facility] EMERGENCY DEPARTMENT Provider Note   CSN: [id number] Arrival date & time: 08/05/20  0327     History Chief Complaint  Patient presents with  . Abdominal Pain    Andre [name] is a 80 y.o. male.  The history is provided by the patient and medical records.  Abdominal Pain   80 year old male with history of coronary artery disease, GERD, hypertension, presenting to the ED with abdominal discomfort.  States he has had some difficulty moving his bowels over the past week or so.  He decided to drink a bottle of magnesium citrate last night to help clean his system out.  States he had 3 large bowel movements and started feeling a little better but then went to bed around 9 PM and felt some lower abdominal cramping.  States he feels it all across his lower abdomen.  He has not had any nausea or vomiting.  He not notice any blood in the stool.  He denies any fever.  No urinary symptoms.  Denies history of abdominal problems.  No prior abdominal surgeries.  Per chart review-- remote history of rectal cancer, most recent colonoscopy in 2014 was grossly normal aside from small polyp that was removed.  Followed by GI, Dr. Benson Norway.  Past Medical History:  Diagnosis Date  . Arthritis   . Coronary artery disease, non-occlusive    a. 06/2011: cath for abnormal exercise echo: Only 30-40% proximal RCA. Otherwise normal. b. 10/2015: cath for abnormal NST and atypical CP --> showed mild 30-40% stenosis in the prox-distal RCA  . GERD (gastroesophageal reflux disease)   . HA (headache)   . Hypertension   . Memory loss   . 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[place])   . TIA (transient ischemic attack)   . Weakness     Patient Active Problem List   Diagnosis Date Noted  . Left shoulder pain 02/11/2020  . Right shoulder pain 02/11/2020  . Vitamin B12 deficiency 12/17/2019  . Mild cognitive impairment 12/17/2019  . Peripheral neuropathy 12/17/2019  . Vitamin D deficiency 12/01/2019  . Patellar  tendonitis of left knee 12/01/2019  . Allergic rhinitis 10/07/2019  . Dementia ([place]) 10/07/2019  . CAD (coronary artery disease) 09/30/2019  . Amnesia 09/10/2019  . Right-sided nosebleed 08/07/2019  . Encounter for well adult exam with abnormal findings 08/07/2019  . Leg cramping 08/07/2019  . Chest pain 03/23/2019  . Epistaxis 03/23/2019  . Hyperlipidemia 12/24/2017  . Alcohol use 12/24/2017  . Non-compliance 12/24/2017  . 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 Left lumbar radiculopathy 08/02/2017  . Atherosclerosis 06/03/2017  . Low-level of literacy 05/12/2017  . Osteoarthritis 04/20/2017  . Diabetes ([place]) 03/12/2017  . Back pain 02/29/2016  . Abnormal stress test 10/18/2015  . Erectile dysfunction 02/25/2015  . Cigarette smoker 02/10/2015  . COPD GOLD 0 11/08/2014  . Hemorrhoids 02/11/2014  . 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 Headache 10/23/2011  . 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[place]) 10/23/2011  . GERD (gastroesophageal reflux disease) 10/23/2011  . Nicotine addiction 10/23/2011  . 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 Cervical stenosis of spinal canal 10/23/2011  . Essential hypertension, benign 07/10/2011    Past Surgical History:  Procedure Laterality Date  . CARDIAC CATHETERIZATION  January 2013   Proximal RCA 30-40%. Otherwise normal.  . CARDIAC CATHETERIZATION N/A 10/18/2015   Procedure: Left Heart Cath and Coronary Angiography;  Surgeon: Jettie Booze, MD;  Location: [facility];  Service: Cardiovascular;  Laterality: N/A;  . NM MYOVIEW  LTD  March 2015   LOW RISK. No scar or ischemia. Normal EF (55%). No RWMA  . surgical excision of rectal cancer         Family History  Problem Relation Age of Onset  . Hypertension Mother   . Cancer Mother   . Liver disease Father     Social History   Tobacco Use  . Smoking status: Current Some Day Smoker    Packs/day: 0.50    Types: Cigarettes  . Smokeless tobacco: Never Used  Vaping Use  . Vaping  Use: Never used  Substance Use Topics  . Alcohol use: Yes    Comment: drinks "every now and then"  . Drug use: No    Home Medications Prior to Admission medications   Medication Sig Start Date End Date Taking? Authorizing Provider  acetaminophen (TYLENOL) 500 MG tablet Take 500 mg by mouth every 6 (six) hours as needed.    [provider]  amLODipine (NORVASC) 2.5 MG tablet Take 2.5 mg by mouth daily. 10/30/19   [provider]  aspirin EC 81 MG tablet Take 1 tablet (81 mg total) by mouth daily. 03/24/19   Binnie Rail, MD  atorvastatin (LIPITOR) 20 MG tablet Take 1 tablet (20 mg total) by mouth daily. 08/07/19   Biagio Borg, MD  donepezil (ARICEPT) 10 MG tablet Take 1 tablet (10 mg total) by mouth at bedtime. 12/17/19   Marcial Pacas, MD  hydrochlorothiazide (HYDRODIURIL) 25 MG tablet TAKE 1 TABLET(25 MG) BY MOUTH DAILY 03/03/20   Biagio Borg, MD  losartan (COZAAR) 100 MG tablet Take 1 tablet (100 mg total) by mouth daily. 08/07/19   Biagio Borg, MD  meloxicam (MOBIC) 15 MG tablet Take 1 tablet (15 mg total) by mouth daily as needed for pain. 02/11/20   Biagio Borg, MD  memantine (NAMENDA) 10 MG tablet Take 1 tablet (10 mg total) by mouth 2 (two) times daily. 12/17/19   Marcial Pacas, MD  omeprazole (PRILOSEC) 40 MG capsule Take 1 capsule (40 mg total) by mouth daily. 09/10/19   Binnie Rail, MD  tizanidine (ZANAFLEX) 2 MG capsule Take 1 capsule (2 mg total) by mouth at bedtime as needed for muscle spasms. 08/07/19   Biagio Borg, MD  triamcinolone (NASACORT) 55 MCG/ACT AERO nasal inhaler Place 2 sprays into the nose daily. 10/07/19   Biagio Borg, MD  vitamin B-12 (CYANOCOBALAMIN) 1000 MCG tablet Take 1 tablet (1,000 mcg total) by mouth daily. 12/01/19   Biagio Borg, MD  Vitamin D, Ergocalciferol, (DRISDOL) 1.25 MG (50000 UNIT) CAPS capsule Take 1 capsule (50,000 Units total) by mouth every 7 (seven) days. 06/30/20   Suzzanne Cloud, NP    Allergies    Patient has no known  allergies.  Review of Systems   Review of Systems  Gastrointestinal: Positive for abdominal pain.  All other systems reviewed and are negative.   Physical Exam Updated Vital Signs BP (!) 158/79 (BP Location: Right Arm)   Pulse 79   Temp 97.7 F (36.5 C) (Oral)   Resp (!) 26   SpO2 98%   Physical Exam Vitals and nursing note reviewed.  Constitutional:      Appearance: He is well-developed and well-nourished.  HENT:     Head: Normocephalic and atraumatic.     Mouth/Throat:     Mouth: Oropharynx is clear and moist.  Eyes:     Extraocular Movements: EOM normal.     Conjunctiva/sclera: Conjunctivae  normal.     Pupils: Pupils are equal, round, and reactive to light.  Cardiovascular:     Rate and Rhythm: Normal rate and regular rhythm.     Heart sounds: Normal heart sounds.  Pulmonary:     Effort: Pulmonary effort is normal.     Breath sounds: Normal breath sounds.  Abdominal:     General: Bowel sounds are normal.     Palpations: Abdomen is soft.     Tenderness: There is no abdominal tenderness. There is no rebound.     Comments: Endorses lower abdominal cramping but no focal tenderness or peritoneal signs  Musculoskeletal:        General: Normal range of motion.     Cervical back: Normal range of motion.  Skin:    General: Skin is warm and dry.  Neurological:     Mental Status: He is alert and oriented to person, place, and time.  Psychiatric:        Mood and Affect: Mood and affect normal.     ED Results / Procedures / Treatments   Labs (all labs ordered are listed, but only abnormal results are displayed) Labs Reviewed  CBC WITH DIFFERENTIAL/PLATELET - Abnormal; Notable for the following components:      Result Value   Eosinophils Absolute 0.6 (*)    All other components within normal limits  COMPREHENSIVE METABOLIC PANEL - Abnormal; Notable for the following components:   Potassium 3.2 (*)    Glucose, Bld 124 (*)    BUN 6 (*)    Total Bilirubin 1.6 (*)     All other components within normal limits  URINALYSIS, ROUTINE W REFLEX MICROSCOPIC - Abnormal; Notable for the following components:   APPearance HAZY (*)    All other components within normal limits  LIPASE, BLOOD    EKG None  Radiology DG ABD ACUTE 2+V W 1V CHEST  Result Date: 08/05/2020 CLINICAL DATA:  Recent constipation, took Mag citrate EXAM: DG ABDOMEN ACUTE WITH 1 VIEW CHEST COMPARISON:  Acute abdominal series 08/08/2011 FINDINGS: Stable cardiomegaly. Some chronic basilar scarring is noted. No new acute consolidative process or convincing features of edema. No subdiaphragmatic free air. No high-grade obstructive bowel gas pattern is seen. Scattered nondistended loops of air-filled large and small bowel throughout the abdomen. No suspicious abdominal calcifications. Phleboliths and vascular calcifications project over the abdomen and pelvis. No subdiaphragmatic free air. Multilevel degenerative changes in the shoulders, spine, hips and pelvis. Enthesopathic changes about the pelvis as well. IMPRESSION: 1. Nonobstructive bowel gas pattern. No free air. 2. Chronic basilar scarring. No acute cardiopulmonary disease. 3. Stable cardiomegaly. Electronically Signed   By: Lovena Le M.D.   On: 08/05/2020 03:57    Procedures Procedures   Medications Ordered in ED Medications - No data to display  ED Course  I have reviewed the triage vital signs and the nursing notes.  Pertinent labs & imaging results that were available during my care of the patient were reviewed by me and considered in my medical decision making (see chart for details).    MDM Rules/Calculators/A&P  80 year old male presenting to the ED with lower abdominal cramping.  States he has had some trouble moving his bowels over the past week and drank a bottle of magnesium citrate last night.  He had 3 subsequent large bowel movements and felt better.  Upon going to bed he noticed some lower abdominal cramping.  He has not  had any nausea or vomiting.  No fever or chills.  No urinary issues.  Denies any significant abdominal history in the past.  He denies any prior abdominal surgeries.  He is afebrile and nontoxic, no acute distress.  His abdomen is soft and nontender.  He endorses diffuse lower abdominal cramping.  Suspect this is likely related to the magnesium citrate.  Labs and films are pending.  Labs are reassuring.  Abdominal films with nonobstructive pattern, no free air.  Per chart review, remote history of rectal cancer that was successfully treated.  Has had follow-up colonoscopy in 2014 with no active disease, did have polyp removed that was benign.  I have recommended to avoid further laxatives for now.  Can increase water and fiber intake to help regulate bowel movements.  Close follow-up with GI for any ongoing issues.  Return here for new or acute changes.  Shared visit with attending physician, Dr. Betsey Holiday, who agrees with plan of care.  Final Clinical Impression(s) / ED Diagnoses Final diagnoses:  Abdominal cramping    Rx / DC Orders ED Discharge Orders    None       Larene Pickett, PA-C 08/05/20 0539    Orpah Greek, MD 08/05/20 0700

## 2020-08-05 NOTE — Discharge Instructions (Addendum)
I would avoid further laxatives for now.  Try to increase your fiber and water intake to keep stools regulated. Follow-up with Dr. Benson Norway if any ongoing issues. Return here for new concerns.

## 2020-08-05 NOTE — ED Triage Notes (Signed)
Pt BIBA from home with constipation for 8 days. Pt self administered a bottle of mag citrate and stool softeners with no bowel movement. Denies nausea/vomiting.

## 2020-08-05 NOTE — ED Notes (Signed)
Pt transported to xray 

## 2020-08-28 ENCOUNTER — Other Ambulatory Visit: Payer: Self-pay | Admitting: Internal Medicine

## 2020-08-28 NOTE — Telephone Encounter (Signed)
[place]

## 2020-11-10 ENCOUNTER — Other Ambulatory Visit: Payer: Self-pay

## 2020-11-10 ENCOUNTER — Emergency Department (HOSPITAL_COMMUNITY): Payer: Medicare Other

## 2020-11-10 ENCOUNTER — Emergency Department (HOSPITAL_COMMUNITY)
Admission: EM | Admit: 2020-11-10 | Discharge: 2020-11-10 | Disposition: A | Discharge status: Home / Self Care | Payer: Medicare Other | Attending: Emergency Medicine | Admitting: Emergency Medicine

## 2020-11-10 DIAGNOSIS — R0789 Other chest pain: Secondary | ICD-10-CM | POA: Diagnosis not present

## 2020-11-10 DIAGNOSIS — R059 Cough, unspecified: Secondary | ICD-10-CM | POA: Insufficient documentation

## 2020-11-10 DIAGNOSIS — R072 Precordial pain: Secondary | ICD-10-CM | POA: Insufficient documentation

## 2020-11-10 DIAGNOSIS — Z20822 Contact with and (suspected) exposure to covid-19: Secondary | ICD-10-CM | POA: Insufficient documentation

## 2020-11-10 DIAGNOSIS — Z8673 Personal history of transient ischemic attack (TIA), and cerebral infarction without residual deficits: Secondary | ICD-10-CM | POA: Diagnosis not present

## 2020-11-10 DIAGNOSIS — E119 Type 2 diabetes mellitus without complications: Secondary | ICD-10-CM | POA: Insufficient documentation

## 2020-11-10 DIAGNOSIS — R079 Chest pain, unspecified: Secondary | ICD-10-CM | POA: Diagnosis not present

## 2020-11-10 DIAGNOSIS — Z7982 Long term (current) use of aspirin: Secondary | ICD-10-CM | POA: Insufficient documentation

## 2020-11-10 DIAGNOSIS — J449 Chronic obstructive pulmonary disease, unspecified: Secondary | ICD-10-CM | POA: Diagnosis not present

## 2020-11-10 DIAGNOSIS — F17210 Nicotine dependence, cigarettes, uncomplicated: Secondary | ICD-10-CM | POA: Insufficient documentation

## 2020-11-10 DIAGNOSIS — F0390 Unspecified dementia without behavioral disturbance: Secondary | ICD-10-CM | POA: Diagnosis not present

## 2020-11-10 DIAGNOSIS — I2510 Atherosclerotic heart disease of native coronary artery without angina pectoris: Secondary | ICD-10-CM | POA: Diagnosis not present

## 2020-11-10 DIAGNOSIS — Z85048 Personal history of other malignant neoplasm of rectum, rectosigmoid junction, and anus: Secondary | ICD-10-CM | POA: Diagnosis not present

## 2020-11-10 DIAGNOSIS — I10 Essential (primary) hypertension: Secondary | ICD-10-CM | POA: Diagnosis not present

## 2020-11-10 DIAGNOSIS — Z79899 Other long term (current) drug therapy: Secondary | ICD-10-CM | POA: Insufficient documentation

## 2020-11-10 LAB — CBC WITH DIFFERENTIAL/PLATELET
Abs Immature Granulocytes: 0.04 10*3/uL (ref 0.00–0.07)
Basophils Absolute: 0 10*3/uL (ref 0.0–0.1)
Basophils Relative: 0 %
Eosinophils Absolute: 0.3 10*3/uL (ref 0.0–0.5)
Eosinophils Relative: 3 %
HCT: 45.4 % (ref 39.0–52.0)
Hemoglobin: 15.1 g/dL (ref 13.0–17.0)
Immature Granulocytes: 1 %
Lymphocytes Relative: 23 %
Lymphs Abs: 2 10*3/uL (ref 0.7–4.0)
MCH: 31.4 pg (ref 26.0–34.0)
MCHC: 33.3 g/dL (ref 30.0–36.0)
MCV: 94.4 fL (ref 80.0–100.0)
Monocytes Absolute: 0.9 10*3/uL (ref 0.1–1.0)
Monocytes Relative: 10 %
Neutro Abs: 5.3 10*3/uL (ref 1.7–7.7)
Neutrophils Relative %: 63 %
Platelets: 248 10*3/uL (ref 150–400)
RBC: 4.81 MIL/uL (ref 4.22–5.81)
RDW: 13.2 % (ref 11.5–15.5)
WBC: 8.6 10*3/uL (ref 4.0–10.5)
nRBC: 0 % (ref 0.0–0.2)

## 2020-11-10 LAB — RESP PANEL BY RT-PCR (FLU A&B, COVID) ARPGX2
Influenza A by PCR: NEGATIVE
Influenza B by PCR: NEGATIVE
SARS Coronavirus 2 by RT PCR: NEGATIVE

## 2020-11-10 LAB — BASIC METABOLIC PANEL
Anion gap: 9 (ref 5–15)
BUN: 8 mg/dL (ref 8–23)
CO2: 27 mmol/L (ref 22–32)
Calcium: 9.2 mg/dL (ref 8.9–10.3)
Chloride: 97 mmol/L — ABNORMAL LOW (ref 98–111)
Creatinine, Ser: 1.05 mg/dL (ref 0.61–1.24)
GFR, Estimated: >60 mL/min (ref >60)
Glucose, Bld: 104 mg/dL — ABNORMAL HIGH (ref 70–99)
Potassium: 3.3 mmol/L — ABNORMAL LOW (ref 3.5–5.1)
Sodium: 133 mmol/L — ABNORMAL LOW (ref 135–145)

## 2020-11-10 LAB — TROPONIN I (HIGH SENSITIVITY)
Troponin I (High Sensitivity): 10 ng/L (ref <18)
Troponin I (High Sensitivity): 14 ng/L (ref <18)

## 2020-11-10 NOTE — ED Provider Notes (Signed)
Emergency Medicine Provider Triage Evaluation Note  Andre [name] , a 80 y.o. male  was evaluated in triage.  Pt complains of CP.  Review of Systems  Positive: CP, cough, burning sensation in throat Negative: Fever, lightheadedness, diaphroretic, sob  Physical Exam  BP 113/75   Pulse 95   Temp 98.9 F (37.2 C) (Oral)   Resp 14   SpO2 96%  Gen:   Awake, no distress   Resp:  Normal effort  MSK:   Moves extremities without difficulty  Other:    Medical Decision Making  Medically screening exam initiated at 1:16 PM.  Appropriate orders placed.  Andre [name] was informed that the remainder of the evaluation will be completed by another provider, this initial triage assessment does not replace that evaluation, and the importance of remaining in the ED until their evaluation is complete.  Recurrent midsternal cp, with burning sensation to throat. Denies hx of GERD.  Intermittent sxs for the past several weeks.    Domenic Moras, PA-C 11/10/20 1319    Lucrezia Starch, MD 11/11/20 1010

## 2020-11-10 NOTE — ED Provider Notes (Signed)
[facility] EMERGENCY DEPARTMENT Provider Note   CSN: [id number] Arrival date & time: 11/10/20  1255     History Chief Complaint  Patient presents with  . Chest Pain    Andre [name] is a 80 y.o. male.  80 year old male with prior medical history as detailed below presents for evaluation of reported chest pain.  Patient reports intermittent chest pain for 3 days.  He localizes the pain to the mid sternum.  He denies active chest pain upon my evaluation.  Patient denies associated shortness of breath.  He does report mild associated intermittent cough.  He denies fever.  The history is provided by the patient and medical records.  Chest Pain Pain location:  Substernal area Pain quality: aching   Pain radiates to:  Does not radiate Pain severity:  No pain Onset quality:  Gradual Duration:  3 days Timing:  Intermittent Progression:  Waxing and waning      Past Medical History:  Diagnosis Date  . Arthritis   . Coronary artery disease, non-occlusive    a. 06/2011: cath for abnormal exercise echo: Only 30-40% proximal RCA. Otherwise normal. b. 10/2015: cath for abnormal NST and atypical CP --> showed mild 30-40% stenosis in the prox-distal RCA  . GERD (gastroesophageal reflux disease)   . HA (headache)   . Hypertension   . Memory loss   . 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[place])   . TIA (transient ischemic attack)   . Weakness     Patient Active Problem List   Diagnosis Date Noted  . Left shoulder pain 02/11/2020  . Right shoulder pain 02/11/2020  . Vitamin B12 deficiency 12/17/2019  . Mild cognitive impairment 12/17/2019  . Peripheral neuropathy 12/17/2019  . Vitamin D deficiency 12/01/2019  . Patellar tendonitis of left knee 12/01/2019  . Allergic rhinitis 10/07/2019  . Dementia ([place]) 10/07/2019  . CAD (coronary artery disease) 09/30/2019  . Amnesia 09/10/2019  . Right-sided nosebleed 08/07/2019  . Encounter for well adult exam with abnormal findings 08/07/2019  .  Leg cramping 08/07/2019  . Chest pain 03/23/2019  . Epistaxis 03/23/2019  . Hyperlipidemia 12/24/2017  . Alcohol use 12/24/2017  . Non-compliance 12/24/2017  . 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 Left lumbar radiculopathy 08/02/2017  . Atherosclerosis 06/03/2017  . Low-level of literacy 05/12/2017  . Osteoarthritis 04/20/2017  . Diabetes ([place]) 03/12/2017  . Back pain 02/29/2016  . Abnormal stress test 10/18/2015  . Erectile dysfunction 02/25/2015  . Cigarette smoker 02/10/2015  . COPD GOLD 0 11/08/2014  . Hemorrhoids 02/11/2014  . 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 Headache 10/23/2011  . 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[place]) 10/23/2011  . GERD (gastroesophageal reflux disease) 10/23/2011  . Nicotine addiction 10/23/2011  . 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 Cervical stenosis of spinal canal 10/23/2011  . Essential hypertension, benign 07/10/2011    Past Surgical History:  Procedure Laterality Date  . CARDIAC CATHETERIZATION  January 2013   Proximal RCA 30-40%. Otherwise normal.  . CARDIAC CATHETERIZATION N/A 10/18/2015   Procedure: Left Heart Cath and Coronary Angiography;  Surgeon: Jettie Booze, MD;  Location: [facility];  Service: Cardiovascular;  Laterality: N/A;  . NM MYOVIEW LTD  March 2015   LOW RISK. No scar or ischemia. Normal EF (55%). No RWMA  . surgical excision of rectal cancer         Family History  Problem Relation Age of Onset  . Hypertension Mother   . Cancer Mother   . Liver disease  Father     Social History   Tobacco Use  . Smoking status: Current Some Day Smoker    Packs/day: 0.50    Types: Cigarettes  . Smokeless tobacco: Never Used  Vaping Use  . Vaping Use: Never used  Substance Use Topics  . Alcohol use: Yes    Comment: drinks "every now and then"  . Drug use: No    Home Medications Prior to Admission medications   Medication Sig Start Date End Date Taking? Authorizing Provider  acetaminophen (TYLENOL) 500 MG  tablet Take 500 mg by mouth every 6 (six) hours as needed.    [provider]  amLODipine (NORVASC) 2.5 MG tablet Take 2.5 mg by mouth daily. 10/30/19   [provider]  aspirin EC 81 MG tablet Take 1 tablet (81 mg total) by mouth daily. 03/24/19   Binnie Rail, MD  atorvastatin (LIPITOR) 20 MG tablet Take 1 tablet (20 mg total) by mouth daily. 08/07/19   Biagio Borg, MD  donepezil (ARICEPT) 10 MG tablet Take 1 tablet (10 mg total) by mouth at bedtime. 12/17/19   Marcial Pacas, MD  hydrochlorothiazide (HYDRODIURIL) 25 MG tablet TAKE 1 TABLET(25 MG) BY MOUTH DAILY 08/28/20   Biagio Borg, MD  losartan (COZAAR) 100 MG tablet Take 1 tablet (100 mg total) by mouth daily. 08/07/19   Biagio Borg, MD  meloxicam (MOBIC) 15 MG tablet Take 1 tablet (15 mg total) by mouth daily as needed for pain. 02/11/20   Biagio Borg, MD  memantine (NAMENDA) 10 MG tablet Take 1 tablet (10 mg total) by mouth 2 (two) times daily. 12/17/19   Marcial Pacas, MD  omeprazole (PRILOSEC) 40 MG capsule Take 1 capsule (40 mg total) by mouth daily. 09/10/19   Binnie Rail, MD  tizanidine (ZANAFLEX) 2 MG capsule Take 1 capsule (2 mg total) by mouth at bedtime as needed for muscle spasms. 08/07/19   Biagio Borg, MD  triamcinolone (NASACORT) 55 MCG/ACT AERO nasal inhaler Place 2 sprays into the nose daily. 10/07/19   Biagio Borg, MD  vitamin B-12 (CYANOCOBALAMIN) 1000 MCG tablet Take 1 tablet (1,000 mcg total) by mouth daily. 12/01/19   Biagio Borg, MD  Vitamin D, Ergocalciferol, (DRISDOL) 1.25 MG (50000 UNIT) CAPS capsule Take 1 capsule (50,000 Units total) by mouth every 7 (seven) days. 06/30/20   Suzzanne Cloud, NP    Allergies    Patient has no known allergies.  Review of Systems   Review of Systems  Cardiovascular: Positive for chest pain.  All other systems reviewed and are negative.   Physical Exam Updated Vital Signs BP 113/75   Pulse 95   Temp 98.9 F (37.2 C) (Oral)   Resp (!) 23   SpO2 96%    Physical Exam Vitals and nursing note reviewed.  Constitutional:      General: He is not in acute distress.    Appearance: He is well-developed.  HENT:     Head: Normocephalic and atraumatic.  Eyes:     Conjunctiva/sclera: Conjunctivae normal.     Pupils: Pupils are equal, round, and reactive to light.  Cardiovascular:     Rate and Rhythm: Normal rate and regular rhythm.     Heart sounds: Normal heart sounds.  Pulmonary:     Effort: Pulmonary effort is normal. No respiratory distress.     Breath sounds: Normal breath sounds.  Abdominal:     General: There is no distension.  Palpations: Abdomen is soft.     Tenderness: There is no abdominal tenderness.  Musculoskeletal:        General: No deformity. Normal range of motion.     Cervical back: Normal range of motion and neck supple.  Skin:    General: Skin is warm and dry.  Neurological:     General: No focal deficit present.     Mental Status: He is alert and oriented to person, place, and time.     ED Results / Procedures / Treatments   Labs (all labs ordered are listed, but only abnormal results are displayed) Labs Reviewed  RESP PANEL BY RT-PCR (FLU A&B, COVID) ARPGX2  CBC WITH DIFFERENTIAL/PLATELET  BASIC METABOLIC PANEL  TROPONIN I (HIGH SENSITIVITY)    EKG EKG Interpretation  Date/Time:  Wednesday November 10 2020 13:17:38 EDT Ventricular Rate:  96 PR Interval:  184 QRS Duration: 86 QT Interval:  368 QTC Calculation: 464 R Axis:   8 Text Interpretation: Sinus rhythm with Premature atrial complexes Nonspecific ST abnormality Abnormal ECG Confirmed by Dene Gentry [phone]) on 11/10/2020 2:35:59 PM   Radiology DG Chest 2 View  Result Date: 11/10/2020 CLINICAL DATA:  Chest pain and cough EXAM: CHEST - 2 VIEW COMPARISON:  07/01/2020 FINDINGS: The heart size and mediastinal contours are within normal limits. Both lungs are clear. The visualized skeletal structures are unremarkable. IMPRESSION: No active  cardiopulmonary disease. Electronically Signed   By: Franchot Gallo M.D.   On: 11/10/2020 15:18    Procedures Procedures   Medications Ordered in ED Medications - No data to display  ED Course  I have reviewed the triage vital signs and the nursing notes.  Pertinent labs & imaging results that were available during my care of the patient were reviewed by me and considered in my medical decision making (see chart for details).    MDM Rules/Calculators/A&P                          MDM  MSE complete  Isabel Freese was evaluated in Emergency Department on 11/10/2020 for the symptoms described in the history of present illness. He was evaluated in the context of the global COVID-19 pandemic, which necessitated consideration that the patient might be at risk for infection with the SARS-CoV-2 virus that causes COVID-19. Institutional protocols and algorithms that pertain to the evaluation of patients at risk for COVID-19 are in a state of rapid change based on information released by regulatory bodies including the CDC and federal and state organizations. These policies and algorithms were followed during the patient's care in the ED.   Patient is presenting with complaint of somewhat atypical substernal chest pressure and discomfort for the last 3 days.  Initial EKG is without evidence of acute ischemia.  Baseline labs including troponin pending.  Dr. Alvino Chapel aware of case.    Final Clinical Impression(s) / ED Diagnoses Final diagnoses:  Chest pain, unspecified type    Rx / DC Orders ED Discharge Orders    None       Valarie Merino, MD 11/10/20 1530

## 2020-11-10 NOTE — ED Triage Notes (Signed)
Pt reports central chest pain up into throat x 3 days. Denies shob, is coughing up phlegm.

## 2020-11-10 NOTE — ED Provider Notes (Signed)
  Physical Exam  BP 132/82   Pulse 85   Temp 98.9 F (37.2 C) (Oral)   Resp (!) 22   SpO2 97%   Physical Exam  ED Course/Procedures     Procedures  MDM  Patient with chest pain.  Anterior chest.  Has had for 3 days.  EKG reassuring.  Troponin negative x2.  Has had previous nonischemic chest pain.  I think patient stable for discharge.  Outpatient follow-up with PCP       Davonna Belling, MD 11/10/20 [phone]

## 2020-11-10 NOTE — ED Notes (Signed)
RN reviewed discharge instructions w/ pt and his daughter. Pt was ready to go. Follow up care and results reviewed, pt had no further questions.

## 2020-11-29 ENCOUNTER — Other Ambulatory Visit: Payer: Self-pay

## 2020-11-29 MED ORDER — AMLODIPINE BESYLATE 2.5 MG PO TABS: 2.5000 mg | ORAL_TABLET | Freq: Every day | ORAL | 2 refills | Status: DC

## 2020-12-11 ENCOUNTER — Other Ambulatory Visit: Payer: Self-pay | Admitting: Neurology

## 2020-12-23 ENCOUNTER — Encounter: Payer: Self-pay | Admitting: Neurology

## 2020-12-23 ENCOUNTER — Ambulatory Visit: Payer: Medicare Other | Admitting: Neurology
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
## 2020-12-31 ENCOUNTER — Ambulatory Visit (INDEPENDENT_AMBULATORY_CARE_PROVIDER_SITE_OTHER): Payer: Medicare Other | Admitting: Internal Medicine

## 2020-12-31 ENCOUNTER — Other Ambulatory Visit: Payer: Self-pay

## 2020-12-31 ENCOUNTER — Encounter: Payer: Self-pay | Admitting: Internal Medicine

## 2020-12-31 VITALS — BP 132/80 | HR 75 | Temp 98.7°F | Resp 18 | Ht 71.0 in | Wt 177.4 lb

## 2020-12-31 DIAGNOSIS — Z0001 Encounter for general adult medical examination with abnormal findings: Secondary | ICD-10-CM | POA: Diagnosis not present

## 2020-12-31 DIAGNOSIS — E538 Deficiency of other specified B group vitamins: Secondary | ICD-10-CM | POA: Diagnosis not present

## 2020-12-31 DIAGNOSIS — J449 Chronic obstructive pulmonary disease, unspecified: Secondary | ICD-10-CM

## 2020-12-31 DIAGNOSIS — Z1159 Encounter for screening for other viral diseases: Secondary | ICD-10-CM | POA: Diagnosis not present

## 2020-12-31 DIAGNOSIS — E7800 Pure hypercholesterolemia, unspecified: Secondary | ICD-10-CM | POA: Diagnosis not present

## 2020-12-31 DIAGNOSIS — F17210 Nicotine dependence, cigarettes, uncomplicated: Secondary | ICD-10-CM | POA: Diagnosis not present

## 2020-12-31 DIAGNOSIS — I10 Essential (primary) hypertension: Secondary | ICD-10-CM | POA: Diagnosis not present

## 2020-12-31 DIAGNOSIS — E1165 Type 2 diabetes mellitus with hyperglycemia: Secondary | ICD-10-CM

## 2020-12-31 DIAGNOSIS — E559 Vitamin D deficiency, unspecified: Secondary | ICD-10-CM | POA: Diagnosis not present

## 2020-12-31 DIAGNOSIS — H538 Other visual disturbances: Secondary | ICD-10-CM

## 2020-12-31 DIAGNOSIS — L609 Nail disorder, unspecified: Secondary | ICD-10-CM

## 2020-12-31 LAB — MICROALBUMIN / CREATININE URINE RATIO
Creatinine,U: 185.2 mg/dL
Microalb Creat Ratio: 0.7 mg/g (ref 0.0–30.0)
Microalb, Ur: 1.2 mg/dL (ref 0.0–1.9)

## 2020-12-31 LAB — BASIC METABOLIC PANEL
BUN: 7 mg/dL (ref 6–23)
CO2: 28 mEq/L (ref 19–32)
Calcium: 9.4 mg/dL (ref 8.4–10.5)
Chloride: 99 mEq/L (ref 96–112)
Creatinine, Ser: 0.86 mg/dL (ref 0.40–1.50)
GFR: 82.64 mL/min (ref >60.00)
Glucose, Bld: 101 mg/dL — ABNORMAL HIGH (ref 70–99)
Potassium: 3.3 mEq/L — ABNORMAL LOW (ref 3.5–5.1)
Sodium: 139 mEq/L (ref 135–145)

## 2020-12-31 LAB — CBC WITH DIFFERENTIAL/PLATELET
Basophils Absolute: 0 10*3/uL (ref 0.0–0.1)
Basophils Relative: 0.7 % (ref 0.0–3.0)
Eosinophils Absolute: 0.4 10*3/uL (ref 0.0–0.7)
Eosinophils Relative: 8.1 % — ABNORMAL HIGH (ref 0.0–5.0)
HCT: 45.8 % (ref 39.0–52.0)
Hemoglobin: 14.9 g/dL (ref 13.0–17.0)
Lymphocytes Relative: 42.8 % (ref 12.0–46.0)
Lymphs Abs: 2.2 10*3/uL (ref 0.7–4.0)
MCHC: 32.5 g/dL (ref 30.0–36.0)
MCV: 93.1 fl (ref 78.0–100.0)
Monocytes Absolute: 0.6 10*3/uL (ref 0.1–1.0)
Monocytes Relative: 11 % (ref 3.0–12.0)
Neutro Abs: 2 10*3/uL (ref 1.4–7.7)
Neutrophils Relative %: 37.4 % — ABNORMAL LOW (ref 43.0–77.0)
Platelets: 261 10*3/uL (ref 150.0–400.0)
RBC: 4.92 Mil/uL (ref 4.22–5.81)
RDW: 14.8 % (ref 11.5–15.5)
WBC: 5.2 10*3/uL (ref 4.0–10.5)

## 2020-12-31 LAB — URINALYSIS, ROUTINE W REFLEX MICROSCOPIC
Bilirubin Urine: NEGATIVE
Hgb urine dipstick: NEGATIVE
Ketones, ur: NEGATIVE
Leukocytes,Ua: NEGATIVE
Nitrite: NEGATIVE
RBC / HPF: NONE SEEN (ref 0–?)
Specific Gravity, Urine: 1.025 (ref 1.000–1.030)
Total Protein, Urine: NEGATIVE
Urine Glucose: NEGATIVE
Urobilinogen, UA: 0.2 (ref 0.0–1.0)
pH: 6 (ref 5.0–8.0)

## 2020-12-31 LAB — HEMOGLOBIN A1C: Hgb A1c MFr Bld: 6.7 % — ABNORMAL HIGH (ref 4.6–6.5)

## 2020-12-31 LAB — LIPID PANEL
Cholesterol: 143 mg/dL (ref 0–200)
HDL: 37.9 mg/dL — ABNORMAL LOW (ref >39.00)
LDL Cholesterol: 84 mg/dL (ref 0–99)
NonHDL: 105.13
Total CHOL/HDL Ratio: 4
Triglycerides: 107 mg/dL (ref 0.0–149.0)
VLDL: 21.4 mg/dL (ref 0.0–40.0)

## 2020-12-31 LAB — PSA: PSA: 0.68 ng/mL (ref 0.10–4.00)

## 2020-12-31 LAB — HEPATIC FUNCTION PANEL
ALT: 9 U/L (ref 0–53)
AST: 15 U/L (ref 0–37)
Albumin: 4 g/dL (ref 3.5–5.2)
Alkaline Phosphatase: 78 U/L (ref 39–117)
Bilirubin, Direct: 0.2 mg/dL (ref 0.0–0.3)
Total Bilirubin: 1.2 mg/dL (ref 0.2–1.2)
Total Protein: 6.9 g/dL (ref 6.0–8.3)

## 2020-12-31 LAB — VITAMIN B12: Vitamin B-12: 211 pg/mL (ref 211–911)

## 2020-12-31 LAB — VITAMIN D 25 HYDROXY (VIT D DEFICIENCY, FRACTURES): VITD: 23.34 ng/mL — ABNORMAL LOW (ref 30.00–100.00)

## 2020-12-31 LAB — TSH: TSH: 1.07 u[IU]/mL (ref 0.35–5.50)

## 2020-12-31 NOTE — Patient Instructions (Signed)
Please continue all other medications as before, and refills have been done if requested.  Please have the pharmacy call with any other refills you may need.  Please continue your efforts at being more active, low cholesterol diet, and weight control.  You are otherwise up to date with prevention measures today.  Please keep your appointments with your specialists as you may have planned  You will be contacted regarding the referral for: eye doctor, and foot doctor  Please go to the LAB at the blood drawing area for the tests to be done  You will be contacted by phone if any changes need to be made immediately.  Otherwise, you will receive a letter about your results with an explanation, but please check with MyChart first.  Please remember to sign up for MyChart if you have not done so, as this will be important to you in the future with finding out test results, communicating by private email, and scheduling acute appointments online when needed.  Please make an Appointment to return in 6 months, or sooner if needed,

## 2020-12-31 NOTE — Progress Notes (Signed)
Patient ID: Andre [name], male   DOB: 1941/06/05, 80 y.o.   MRN: [id number]         Chief Complaint:: wellness exam and 6 month F/U and Health Maintenance (Eye exam is due. )  And nail disorder, copd, smoking, Vit D and b12 deficiency, htn, hld, dm       HPI:  Andre [name] is a 80 y.o. male here for wellness exam; due for hep c screen, eye exam, but declines covid booster, shingrix, pneumovax, tdap o/w up to date with preventive referrals and immunizations                         Also also c/o worsening long nails one in particular starting to dig into another toe and causing irritation.  Pt denies chest pain, increased sob or doe, wheezing, orthopnea, PND, increased LE swelling, palpitations, dizziness or syncope.   Pt denies polydipsia, polyuria, or new focal neuro s/s.  Pt denies fever, wt loss, night sweats, loss of appetite, or other constitutional symptoms  Hs no other new complaints   not taking Vit D   Wt Readings from Last 3 Encounters:  12/31/20 177 lb 6.4 oz (80.5 kg)  07/15/20 177 lb 11.1 oz (80.6 kg)  07/13/20 177 lb 9.6 oz (80.6 kg)   BP Readings from Last 3 Encounters:  12/31/20 132/80  11/10/20 112/78  08/05/20 123/78   Immunization History  Administered Date(s) Administered   Fluad Quad(high Dose 65+) 07/07/2020   Influenza,inj,Quad PF,6+ Mos 12/17/2014   Influenza-Unspecified 05/12/2014   PFIZER(Purple Top)SARS-COV-2 Vaccination 08/14/2019, 09/16/2019   Health Maintenance Due  Topic Date Due   OPHTHALMOLOGY EXAM  Never done   Hepatitis C Screening  Never done      Past Medical History:  Diagnosis Date   Arthritis    Coronary artery disease, non-occlusive    a. 06/2011: cath for abnormal exercise echo: Only 30-40% proximal RCA. Otherwise normal. b. 10/2015: cath for abnormal NST and atypical CP --> showed mild 30-40% stenosis in the prox-distal RCA   GERD (gastroesophageal reflux disease)    HA (headache)    Hypertension    Memory loss    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[place])     TIA (transient ischemic attack)    Weakness    Past Surgical History:  Procedure Laterality Date   CARDIAC CATHETERIZATION  January 2013   Proximal RCA 30-40%. Otherwise normal.   CARDIAC CATHETERIZATION N/A 10/18/2015   Procedure: Left Heart Cath and Coronary Angiography;  Surgeon: Jettie Booze, MD;  Location: [facility];  Service: Cardiovascular;  Laterality: N/A;   NM MYOVIEW LTD  March 2015   LOW RISK. No scar or ischemia. Normal EF (55%). No RWMA   surgical excision of rectal cancer      reports that he has been smoking cigarettes. He has been smoking an average of .5 packs per day. He has never used smokeless tobacco. He reports current alcohol use. He reports that he does not use drugs. family history includes Cancer in his mother; Hypertension in his mother; Liver disease in his father. No Known Allergies Current Outpatient Medications on File Prior to Visit  Medication Sig Dispense Refill   acetaminophen (TYLENOL) 500 MG tablet Take 500 mg by mouth every 6 (six) hours as needed.     amLODipine (NORVASC) 5 MG tablet Take 5 mg by mouth daily.     aspirin EC 81 MG tablet Take 1 tablet (81 mg total) by  mouth daily.     atorvastatin (LIPITOR) 20 MG tablet Take 1 tablet (20 mg total) by mouth daily. 90 tablet 3   donepezil (ARICEPT) 10 MG tablet Take 1 tablet (10 mg total) by mouth at bedtime. 90 tablet 4   hydrochlorothiazide (HYDRODIURIL) 25 MG tablet TAKE 1 TABLET(25 MG) BY MOUTH DAILY 90 tablet 3   losartan (COZAAR) 100 MG tablet Take 1 tablet (100 mg total) by mouth daily. 90 tablet 3   meloxicam (MOBIC) 15 MG tablet Take 1 tablet (15 mg total) by mouth daily as needed for pain. 90 tablet 1   memantine (NAMENDA) 10 MG tablet Take 1 tablet (10 mg total) by mouth 2 (two) times daily. 60 tablet 11   omeprazole (PRILOSEC) 40 MG capsule Take 1 capsule (40 mg total) by mouth daily. 30 capsule 5   tizanidine (ZANAFLEX) 2 MG capsule Take 1 capsule (2 mg total) by mouth at  bedtime as needed for muscle spasms. 30 capsule 5   triamcinolone (NASACORT) 55 MCG/ACT AERO nasal inhaler Place 2 sprays into the nose daily. 1 Inhaler 12   vitamin B-12 (CYANOCOBALAMIN) 1000 MCG tablet Take 1 tablet (1,000 mcg total) by mouth daily. 90 tablet 3   Vitamin D, Ergocalciferol, (DRISDOL) 1.25 MG (50000 UNIT) CAPS capsule TAKE 1 CAPSULE BY MOUTH EVERY 7 DAYS 10 capsule 0   No current facility-administered medications on file prior to visit.        ROS:  All others reviewed and negative.  Objective        PE:  BP 132/80   Pulse 75   Temp 98.7 F (37.1 C) (Oral)   Resp 18   Ht 5\' 11"  (1.803 m)   Wt 177 lb 6.4 oz (80.5 kg)   SpO2 96%   BMI 24.74 kg/m                 Constitutional: Pt appears in NAD               HENT: Head: NCAT.                Right Ear: External ear normal.                 Left Ear: External ear normal.                Eyes: . Pupils are equal, round, and reactive to light. Conjunctivae and EOM are normal               Nose: without d/c or deformity               Neck: Neck supple. Gross normal ROM               Cardiovascular: Normal rate and regular rhythm.                 Pulmonary/Chest: Effort normal and breath sounds without rales or wheezing.                Abd:  Soft, NT, ND, + BS, no organomegaly               Neurological: Pt is alert. At baseline orientation, motor grossly intact               Skin: Skin is warm. No rashes, no other new lesions, LE edema - none               Psychiatric: Pt behavior is normal without  agitation   Micro: none  Cardiac tracings I have personally interpreted today:  none  Pertinent Radiological findings (summarize): none   Lab Results  Component Value Date   WBC 5.2 12/31/2020   HGB 14.9 12/31/2020   HCT 45.8 12/31/2020   PLT 261.0 12/31/2020   GLUCOSE 101 (H) 12/31/2020   CHOL 143 12/31/2020   TRIG 107.0 12/31/2020   HDL 37.90 (L) 12/31/2020   LDLCALC 84 12/31/2020   ALT 9 12/31/2020   AST 15  12/31/2020   NA 139 12/31/2020   K 3.3 (L) 12/31/2020   CL 99 12/31/2020   CREATININE 0.86 12/31/2020   BUN 7 12/31/2020   CO2 28 12/31/2020   TSH 1.07 12/31/2020   PSA 0.68 12/31/2020   INR 1.09 10/18/2015   HGBA1C 6.7 (H) 12/31/2020   MICROALBUR 1.2 12/31/2020   Assessment/Plan:  Andre [name] is a 80 y.o. Black or African American [2] male with  has a past medical history of Arthritis, Coronary artery disease, non-occlusive, GERD (gastroesophageal reflux disease), HA (headache), Hypertension, Memory loss, 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[place]), TIA (transient ischemic attack), and Weakness.  Vitamin D deficiency Last vitamin D Lab Results  Component Value Date   VD25OH 8.0 (L) 06/24/2020   Low, to start oral replacement   Encounter for well adult exam with abnormal findings Age and sex appropriate education and counseling updated with regular exercise and diet Referrals for preventative services - for hep c screen, eye exam referral and podiatry referral Immunizations addressed - declines covid booster, shingrix, pneumovax, tdap Smoking counseling  - counseled to quit, pt not ready Evidence for depression or other mood disorder - none significant Most recent labs reviewed. I have personally reviewed and have noted: 1) the patient's medical and social history 2) The patient's current medications and supplements 3) The patient's height, weight, and BMI have been recorded in the chart   COPD GOLD 0 Stable overall, cont current med tx - declines inhaler prn for now   Cigarette smoker Pt counseled to quit, pt not ready  Vitamin B12 deficiency Lab Results  Component Value Date   VITAMINB12 211 12/31/2020   Low normal, to start oral replacement - b12 1000 mcg qd   Hyperlipidemia Lab Results  Component Value Date   LDLCALC 84 12/31/2020   Uncontrolled, goal ldl < 70, pt to continue current statin lipitor 20 as declines increase for now   Essential hypertension, benign BP Readings  from Last 3 Encounters:  12/31/20 132/80  11/10/20 112/78  08/05/20 123/78   Stable, pt to continue medical treatment losartan, hct, amlodipine   Diabetes ([place]) Lab Results  Component Value Date   HGBA1C 6.7 (H) 12/31/2020   With mild worsening, pt to continue current medical treatment  - diet   Nail disorder Also with worsening nails, for podiatry referral  Blurry vision, bilateral Also likely needs reading glasses up close, for optho as is due as well Followup: No follow-ups on file.  Cathlean Cower, MD 01/01/2021 5:26 PM [facility]

## 2020-12-31 NOTE — Addendum Note (Signed)
Addended by: Jacobo Forest on: 12/31/2020 09:37 AM   Modules accepted: Orders

## 2020-12-31 NOTE — Assessment & Plan Note (Signed)
Last vitamin D Lab Results  Component Value Date   VD25OH 8.0 (L) 06/24/2020   Low, to start oral replacement

## 2021-01-01 NOTE — Assessment & Plan Note (Signed)
Pt counseled to quit, pt not ready 

## 2021-01-01 NOTE — Assessment & Plan Note (Signed)
Age and sex appropriate education and counseling updated with regular exercise and diet Referrals for preventative services - for hep c screen, eye exam referral and podiatry referral Immunizations addressed - declines covid booster, shingrix, pneumovax, tdap Smoking counseling  - counseled to quit, pt not ready Evidence for depression or other mood disorder - none significant Most recent labs reviewed. I have personally reviewed and have noted: 1) the patient's medical and social history 2) The patient's current medications and supplements 3) The patient's height, weight, and BMI have been recorded in the chart

## 2021-01-01 NOTE — Assessment & Plan Note (Signed)
Also with worsening nails, for podiatry referral

## 2021-01-01 NOTE — Assessment & Plan Note (Signed)
Lab Results  Component Value Date   LDLCALC 84 12/31/2020   Uncontrolled, goal ldl < 70, pt to continue current statin lipitor 20 as declines increase for now

## 2021-01-01 NOTE — Assessment & Plan Note (Signed)
BP Readings from Last 3 Encounters:  12/31/20 132/80  11/10/20 112/78  08/05/20 123/78   Stable, pt to continue medical treatment losartan, hct, amlodipine

## 2021-01-01 NOTE — Assessment & Plan Note (Signed)
Stable overall, cont current med tx - declines inhaler prn for now

## 2021-01-01 NOTE — Assessment & Plan Note (Signed)
Also likely needs reading glasses up close, for optho as is due as well

## 2021-01-01 NOTE — Assessment & Plan Note (Signed)
Lab Results  Component Value Date   VITAMINB12 211 12/31/2020   Low normal, to start oral replacement - b12 1000 mcg qd

## 2021-01-01 NOTE — Assessment & Plan Note (Signed)
Lab Results  Component Value Date   HGBA1C 6.7 (H) 12/31/2020   With mild worsening, pt to continue current medical treatment  - diet

## 2021-01-02 ENCOUNTER — Encounter: Payer: Self-pay | Admitting: Internal Medicine

## 2021-01-03 LAB — HEPATITIS C ANTIBODY
Hepatitis C Ab: NONREACTIVE
SIGNAL TO CUT-OFF: 0.05 (ref <1.00)

## 2021-01-14 ENCOUNTER — Encounter: Payer: Self-pay | Admitting: Podiatry

## 2021-01-14 ENCOUNTER — Other Ambulatory Visit: Payer: Self-pay

## 2021-01-14 ENCOUNTER — Ambulatory Visit (INDEPENDENT_AMBULATORY_CARE_PROVIDER_SITE_OTHER): Payer: Medicare Other | Admitting: Podiatry

## 2021-01-14 DIAGNOSIS — M79675 Pain in left toe(s): Secondary | ICD-10-CM

## 2021-01-14 DIAGNOSIS — B351 Tinea unguium: Secondary | ICD-10-CM

## 2021-01-14 DIAGNOSIS — M79674 Pain in right toe(s): Secondary | ICD-10-CM

## 2021-01-14 NOTE — Progress Notes (Signed)
This patient returns to my office for at risk foot care.  This patient requires this care by a professional since this patient will be at risk due to having diabetic neuropathy.  Patient has not been seen in 2 years.  This patient is unable to cut nails himself since the patient cannot reach his nails.These nails are painful walking and wearing shoes.  This patient presents for at risk foot care today.  General Appearance  Alert, conversant and in no acute stress.  Vascular  Dorsalis pedis and posterior tibial  pulses are  weakly palpable  bilaterally.  Capillary return is within normal limits  bilaterally. Temperature is within normal limits  bilaterally.  Neurologic  Senn-Weinstein monofilament wire test diminished  bilaterally. Muscle power within normal limits bilaterally.  Nails Thick disfigured discolored nails with subungual debris  from hallux to fifth toes bilaterally. No evidence of bacterial infection or drainage bilaterally.  Orthopedic  No limitations of motion  feet .  No crepitus or effusions noted.  No bony pathology or digital deformities noted.  Skin  normotropic skin with no porokeratosis noted bilaterally.  No signs of infections or ulcers noted.     Onychomycosis  Pain in right toes  Pain in left toes  Consent was obtained for treatment procedures.   Mechanical debridement of nails 1-5  bilaterally performed with a nail nipper.  Filed with dremel without incident.    Return office visit                     Told patient to return for periodic foot care and evaluation due to potential at risk complications.   Gardiner Barefoot DPM

## 2021-01-26 ENCOUNTER — Telehealth: Payer: Self-pay | Admitting: Lab

## 2021-01-26 NOTE — Chronic Care Management (AMB) (Signed)
  Chronic Care Management   Note  01/26/2021 Name: Corie Vavra MRN: [id number] DOB: 11/07/1940  Riel Hirschman is a 80 y.o. year old male who is a primary care patient of Biagio Borg, MD. I reached out to Bari Mantis by phone today in response to a referral sent by Mr. Collins [place] Sainato's PCP, Biagio Borg, MD.   Mr. Etheredge was given information about Chronic Care Management services today including:  CCM service includes personalized support from designated clinical staff supervised by his physician, including individualized plan of care and coordination with other care providers 24/7 contact phone numbers for assistance for urgent and routine care needs. Service will only be billed when office clinical staff spend 20 minutes or more in a month to coordinate care. Only one practitioner may furnish and bill the service in a calendar month. The patient may stop CCM services at any time (effective at the end of the month) by phone call to the office staff.   Patient agreed to services and verbal consent obtained.   Follow up plan:   [facility]

## 2021-03-01 ENCOUNTER — Telehealth: Payer: Self-pay

## 2021-03-01 NOTE — Chronic Care Management (AMB) (Signed)
Chronic Care Management Pharmacy Assistant   Name: Andre [name]  MRN: [id number] DOB: 09-22-1940  Andre [name] is an 80 y.o. year old male who presents for his initial CCM visit with the clinical pharmacist.   Recent office visits:  12/31/20 Biagio Borg MD - Seen for Annual wellness visit - Labs ordered - Start b12 1000 mcg oral qd - Follow up in 6 months   Recent consult visits:  01/14/21 Gardiner Barefoot DPM - Podiatry - Seen for pain due to onychomycosis of toenails of both feet - No medication changes noted - Follow up in 6 months   Hospital visits:  Medication Reconciliation was completed by comparing discharge summary, patient's EMR and Pharmacy list, and upon discussion with patient.  Admitted to the hospital on 11/10/20 due to Chest pain. Discharge date was 11/10/20. Discharged from [facility]?Medications Started at [facility] Discharge:?? N/a  Medication Changes at Hospital Discharge: N/a  Medications Discontinued at Hospital Discharge: N/a  Medications that remain the same after Hospital Discharge:??  -All other medications will remain the same.    Medications: Outpatient Encounter Medications as of 03/01/2021  Medication Sig   acetaminophen (TYLENOL) 500 MG tablet Take 500 mg by mouth every 6 (six) hours as needed.   amLODipine (NORVASC) 5 MG tablet Take 5 mg by mouth daily.   aspirin EC 81 MG tablet Take 1 tablet (81 mg total) by mouth daily.   atorvastatin (LIPITOR) 20 MG tablet Take 1 tablet (20 mg total) by mouth daily.   donepezil (ARICEPT) 10 MG tablet Take 1 tablet (10 mg total) by mouth at bedtime.   hydrochlorothiazide (HYDRODIURIL) 25 MG tablet TAKE 1 TABLET(25 MG) BY MOUTH DAILY   losartan (COZAAR) 100 MG tablet Take 1 tablet (100 mg total) by mouth daily.   meloxicam (MOBIC) 15 MG tablet Take 1 tablet (15 mg total) by mouth daily as needed for pain.   memantine (NAMENDA) 10 MG tablet Take 1 tablet (10 mg total) by mouth 2 (two) times  daily.   omeprazole (PRILOSEC) 40 MG capsule Take 1 capsule (40 mg total) by mouth daily.   tizanidine (ZANAFLEX) 2 MG capsule Take 1 capsule (2 mg total) by mouth at bedtime as needed for muscle spasms.   triamcinolone (NASACORT) 55 MCG/ACT AERO nasal inhaler Place 2 sprays into the nose daily.   vitamin B-12 (CYANOCOBALAMIN) 1000 MCG tablet Take 1 tablet (1,000 mcg total) by mouth daily.   Vitamin D, Ergocalciferol, (DRISDOL) 1.25 MG (50000 UNIT) CAPS capsule TAKE 1 CAPSULE BY MOUTH EVERY 7 DAYS   No facility-administered encounter medications on file as of 03/01/2021.    Care Gaps: COVID-19 Vaccine: Last completed: Sep 16, 2019 INFLUENZA VACCINE: Last completed: Jul 07, 2020 OPHTHALMOLOGY EXAM: Never done    acetaminophen (TYLENOL) 500 MG tablet - Last filled 06/26/18 DS unknown  amLODipine (NORVASC) 5 MG tablet- Last filled 12/31/20 DS unknown  aspirin EC 81 MG tablet- Last filled 03/24/19 DS unknown atorvastatin (LIPITOR) 20 MG tablet - Last filled 08/01/20 90 DS donepezil (ARICEPT) 10 MG tablet- Last filled 12/17/19 90 DS hydrochlorothiazide (HYDRODIURIL) 25 MG tablet- Last filled 02/24/21 90 DS losartan (COZAAR) 100 MG tablet - Last filled 08/07/19 90 DS meloxicam (MOBIC) 15 MG tablet- Last filled 02/11/20 90 DS memantine (NAMENDA) 10 MG tablet- Last filled 12/17/19 30 DS omeprazole (PRILOSEC) 40 MG capsule- Last filled 09/10/19 90 DS tizanidine (ZANAFLEX) 2 MG capsule- Last filled 08/07/19 30 DS triamcinolone (NASACORT) 55 MCG/ACT  AERO nasal inhaler- Last filled 10/07/19 DS unknown  vitamin B-12 (CYANOCOBALAMIN) 1000 MCG tablet- Last filled 12/01/19 90 DS Vitamin D, Ergocalciferol, (DRISDOL) 1.25 MG (50000 UNIT) CAPS capsule- Last filled 12/15/20 10 week DS   Star Rating Drugs: atorvastatin (LIPITOR) 20 MG tablet - Last filled 08/01/20 90 DS losartan (COZAAR) 100 MG tablet - Last filled 08/07/19 90 DS  Andee Poles, CMA

## 2021-03-08 ENCOUNTER — Ambulatory Visit: Payer: Medicare Other

## 2021-03-10 ENCOUNTER — Encounter (HOSPITAL_COMMUNITY): Payer: Self-pay | Admitting: Emergency Medicine

## 2021-03-10 ENCOUNTER — Other Ambulatory Visit: Payer: Self-pay

## 2021-03-10 ENCOUNTER — Emergency Department (HOSPITAL_COMMUNITY): Payer: Medicare Other

## 2021-03-10 ENCOUNTER — Emergency Department (HOSPITAL_COMMUNITY)
Admission: EM | Admit: 2021-03-10 | Discharge: 2021-03-10 | Disposition: A | Discharge status: Home / Self Care | Payer: Medicare Other | Attending: Emergency Medicine | Admitting: Emergency Medicine

## 2021-03-10 DIAGNOSIS — R011 Cardiac murmur, unspecified: Secondary | ICD-10-CM | POA: Diagnosis not present

## 2021-03-10 DIAGNOSIS — F17210 Nicotine dependence, cigarettes, uncomplicated: Secondary | ICD-10-CM | POA: Insufficient documentation

## 2021-03-10 DIAGNOSIS — I10 Essential (primary) hypertension: Secondary | ICD-10-CM | POA: Insufficient documentation

## 2021-03-10 DIAGNOSIS — F0390 Unspecified dementia without behavioral disturbance: Secondary | ICD-10-CM | POA: Insufficient documentation

## 2021-03-10 DIAGNOSIS — R1032 Left lower quadrant pain: Secondary | ICD-10-CM | POA: Diagnosis not present

## 2021-03-10 DIAGNOSIS — I2510 Atherosclerotic heart disease of native coronary artery without angina pectoris: Secondary | ICD-10-CM | POA: Diagnosis not present

## 2021-03-10 DIAGNOSIS — J449 Chronic obstructive pulmonary disease, unspecified: Secondary | ICD-10-CM | POA: Insufficient documentation

## 2021-03-10 DIAGNOSIS — Z79899 Other long term (current) drug therapy: Secondary | ICD-10-CM | POA: Insufficient documentation

## 2021-03-10 DIAGNOSIS — Z85048 Personal history of other malignant neoplasm of rectum, rectosigmoid junction, and anus: Secondary | ICD-10-CM | POA: Diagnosis not present

## 2021-03-10 DIAGNOSIS — R0789 Other chest pain: Secondary | ICD-10-CM | POA: Diagnosis not present

## 2021-03-10 DIAGNOSIS — K219 Gastro-esophageal reflux disease without esophagitis: Secondary | ICD-10-CM | POA: Insufficient documentation

## 2021-03-10 DIAGNOSIS — R109 Unspecified abdominal pain: Secondary | ICD-10-CM | POA: Diagnosis present

## 2021-03-10 DIAGNOSIS — R079 Chest pain, unspecified: Secondary | ICD-10-CM | POA: Insufficient documentation

## 2021-03-10 DIAGNOSIS — Z7982 Long term (current) use of aspirin: Secondary | ICD-10-CM | POA: Insufficient documentation

## 2021-03-10 LAB — COMPREHENSIVE METABOLIC PANEL
ALT: 16 U/L (ref 0–44)
AST: 22 U/L (ref 15–41)
Albumin: 3.9 g/dL (ref 3.5–5.0)
Alkaline Phosphatase: 81 U/L (ref 38–126)
Anion gap: 11 (ref 5–15)
BUN: 6 mg/dL — ABNORMAL LOW (ref 8–23)
CO2: 30 mmol/L (ref 22–32)
Calcium: 9.6 mg/dL (ref 8.9–10.3)
Chloride: 97 mmol/L — ABNORMAL LOW (ref 98–111)
Creatinine, Ser: 0.93 mg/dL (ref 0.61–1.24)
GFR, Estimated: >60 mL/min (ref >60)
Glucose, Bld: 115 mg/dL — ABNORMAL HIGH (ref 70–99)
Potassium: 3.6 mmol/L (ref 3.5–5.1)
Sodium: 138 mmol/L (ref 135–145)
Total Bilirubin: 1.5 mg/dL — ABNORMAL HIGH (ref 0.3–1.2)
Total Protein: 7.4 g/dL (ref 6.5–8.1)

## 2021-03-10 LAB — CBC WITH DIFFERENTIAL/PLATELET
Abs Immature Granulocytes: 0 10*3/uL (ref 0.00–0.07)
Basophils Absolute: 0 10*3/uL (ref 0.0–0.1)
Basophils Relative: 1 %
Eosinophils Absolute: 0.6 10*3/uL — ABNORMAL HIGH (ref 0.0–0.5)
Eosinophils Relative: 10 %
HCT: 50.3 % (ref 39.0–52.0)
Hemoglobin: 16.4 g/dL (ref 13.0–17.0)
Immature Granulocytes: 0 %
Lymphocytes Relative: 51 %
Lymphs Abs: 3.1 10*3/uL (ref 0.7–4.0)
MCH: 29.8 pg (ref 26.0–34.0)
MCHC: 32.6 g/dL (ref 30.0–36.0)
MCV: 91.3 fL (ref 80.0–100.0)
Monocytes Absolute: 0.6 10*3/uL (ref 0.1–1.0)
Monocytes Relative: 10 %
Neutro Abs: 1.6 10*3/uL — ABNORMAL LOW (ref 1.7–7.7)
Neutrophils Relative %: 28 %
Platelets: 223 10*3/uL (ref 150–400)
RBC: 5.51 MIL/uL (ref 4.22–5.81)
RDW: 15 % (ref 11.5–15.5)
WBC: 5.9 10*3/uL (ref 4.0–10.5)
nRBC: 0 % (ref 0.0–0.2)

## 2021-03-10 LAB — TYPE AND SCREEN
ABO/RH(D): O POS
Antibody Screen: NEGATIVE

## 2021-03-10 LAB — PROTIME-INR
INR: 1 (ref 0.8–1.2)
Prothrombin Time: 13.3 seconds (ref 11.4–15.2)

## 2021-03-10 LAB — POC OCCULT BLOOD, ED: Fecal Occult Bld: NEGATIVE

## 2021-03-10 LAB — TROPONIN I (HIGH SENSITIVITY)
Troponin I (High Sensitivity): 7 ng/L (ref <18)
Troponin I (High Sensitivity): 5 ng/L (ref <18)

## 2021-03-10 MED ORDER — ACETAMINOPHEN 325 MG PO TABS
650.0000 mg | ORAL_TABLET | Freq: Once | ORAL | Status: AC
  Administered 2021-03-10: 650 mg via ORAL
  Filled 2021-03-10: qty 2

## 2021-03-10 NOTE — ED Triage Notes (Signed)
Pt endorses lower abd pain and black stools for 2 weeks. Also endorses upper chest pain with no radiation.

## 2021-03-10 NOTE — ED Notes (Signed)
Pt very upset about the wait and wanting to leave MD notified

## 2021-03-10 NOTE — Discharge Instructions (Addendum)
As we discussed your work-up today was reassuring for cardiac causes of chest pain.  We did not complete your work-up for abdominal pain.  However you are not in acute distress at this time.  Your lab work was unremarkable so far.  I recommend you try some over-the-counter treatments for reflux, and stomach upset.  I recommend that you follow-up with gastroenterology if you continue to have symptoms.  Please return if you begin to have severe abdominal pain especially with bright red blood in your stool, or lightheadedness.

## 2021-03-10 NOTE — ED Provider Notes (Signed)
[facility] EMERGENCY DEPARTMENT Provider Note   CSN: [id number] Arrival date & time: 03/10/21  6789     History Chief Complaint  Patient presents with   Abdominal Pain    Andre [name] is a 80 y.o. male with a past medical history significant for coronary artery disease, hypertension, rectal cancer(patient denies history of this when asked), TIA who presents with lower abdominal pain, with black stools for last 2 weeks, patient also endorses some chest pain.  Patient reports the chest pain and abdominal pain are both on and off.  Patient reports abdominal pain is worse at night.  Patient takes aspirin, reports that he drinks 1 shot during weekdays, 3 shots of alcohol on weekends per day.  Patient reports a history of tobacco use, reports that he quit around 1 month ago.  Patient reports dark stools, without bright red blood per rectum.  No pain with defecation.  Patient denies nausea, vomiting, dysuria, urinary frequency, shortness of breath.   Abdominal Pain Associated symptoms: chest pain   Associated symptoms: no shortness of breath       Past Medical History:  Diagnosis Date   Arthritis    Coronary artery disease, non-occlusive    a. 06/2011: cath for abnormal exercise echo: Only 30-40% proximal RCA. Otherwise normal. b. 10/2015: cath for abnormal NST and atypical CP --> showed mild 30-40% stenosis in the prox-distal RCA   GERD (gastroesophageal reflux disease)    HA (headache)    Hypertension    Memory loss    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[place])    TIA (transient ischemic attack)    Weakness     Patient Active Problem List   Diagnosis Date Noted   Pain due to onychomycosis of toenails of both feet 01/14/2021   Blurry vision, bilateral 12/31/2020   Nail disorder 12/31/2020   Left shoulder pain 02/11/2020   Right shoulder pain 02/11/2020   Vitamin B12 deficiency 12/17/2019   Mild cognitive impairment 12/17/2019   Peripheral neuropathy 12/17/2019   Vitamin D  deficiency 12/01/2019   Patellar tendonitis of left knee 12/01/2019   Allergic rhinitis 10/07/2019   Dementia ([place]) 10/07/2019   CAD (coronary artery disease) 09/30/2019   Amnesia 09/10/2019   Right-sided nosebleed 08/07/2019   Encounter for well adult exam with abnormal findings 08/07/2019   Leg cramping 08/07/2019   Chest pain 03/23/2019   Epistaxis 03/23/2019   Hyperlipidemia 12/24/2017   Alcohol use 12/24/2017   Non-compliance 12/24/2017   Degenerative disc disease, lumbar 11/15/2017   Left lumbar radiculopathy 08/02/2017   Atherosclerosis 06/03/2017   Low-level of literacy 05/12/2017   Osteoarthritis 04/20/2017   Diabetes ([place]) 03/12/2017   Back pain 02/29/2016   Abnormal stress test 10/18/2015   Erectile dysfunction 02/25/2015   Cigarette smoker 02/10/2015   COPD GOLD 0 11/08/2014   Hemorrhoids 02/11/2014   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 Otherwise normal.   CARDIAC CATHETERIZATION N/A 10/18/2015   Procedure: Left Heart Cath and Coronary Angiography;  Surgeon: Jettie Booze, MD;  Location: [facility];  Service: Cardiovascular;  Laterality: N/A;   NM MYOVIEW LTD  March 2015  LOW RISK. No scar or ischemia. Normal EF (55%). No RWMA   surgical excision of rectal cancer         Family History  Problem Relation Age of Onset   Hypertension Mother    Cancer Mother    Liver disease Father     Social History   Tobacco Use   Smoking status: Some Days    Packs/day: 0.50    Types: Cigarettes   Smokeless tobacco: Never  Vaping Use   Vaping Use: Never used  Substance  Use Topics   Alcohol use: Yes    Comment: drinks "every now and then"   Drug use: No    Home Medications Prior to Admission medications   Medication Sig Start Date End Date Taking? Authorizing Provider  acetaminophen (TYLENOL) 500 MG tablet Take 500 mg by mouth every 6 (six) hours as needed.    [provider]  amLODipine (NORVASC) 5 MG tablet Take 5 mg by mouth daily. 08/27/20   [provider]  aspirin EC 81 MG tablet Take 1 tablet (81 mg total) by mouth daily. 03/24/19   Binnie Rail, MD  atorvastatin (LIPITOR) 20 MG tablet Take 1 tablet (20 mg total) by mouth daily. 08/07/19   Biagio Borg, MD  donepezil (ARICEPT) 10 MG tablet Take 1 tablet (10 mg total) by mouth at bedtime. 12/17/19   Marcial Pacas, MD  hydrochlorothiazide (HYDRODIURIL) 25 MG tablet TAKE 1 TABLET(25 MG) BY MOUTH DAILY 08/28/20   Biagio Borg, MD  losartan (COZAAR) 100 MG tablet Take 1 tablet (100 mg total) by mouth daily. 08/07/19   Biagio Borg, MD  meloxicam (MOBIC) 15 MG tablet Take 1 tablet (15 mg total) by mouth daily as needed for pain. 02/11/20   Biagio Borg, MD  memantine (NAMENDA) 10 MG tablet Take 1 tablet (10 mg total) by mouth 2 (two) times daily. 12/17/19   Marcial Pacas, MD  omeprazole (PRILOSEC) 40 MG capsule Take 1 capsule (40 mg total) by mouth daily. 09/10/19   Binnie Rail, MD  tizanidine (ZANAFLEX) 2 MG capsule Take 1 capsule (2 mg total) by mouth at bedtime as needed for muscle spasms. 08/07/19   Biagio Borg, MD  triamcinolone (NASACORT) 55 MCG/ACT AERO nasal inhaler Place 2 sprays into the nose daily. 10/07/19   Biagio Borg, MD  vitamin B-12 (CYANOCOBALAMIN) 1000 MCG tablet Take 1 tablet (1,000 mcg total) by mouth daily. 12/01/19   Biagio Borg, MD  Vitamin D, Ergocalciferol, (DRISDOL) 1.25 MG (50000 UNIT) CAPS capsule TAKE 1 CAPSULE BY MOUTH EVERY 7 DAYS 12/15/20   Suzzanne Cloud, NP    Allergies    Patient has no known allergies.  Review of Systems   Review of Systems  Respiratory:   Positive for chest tightness. Negative for shortness of breath.   Cardiovascular:  Positive for chest pain.  Gastrointestinal:  Positive for abdominal pain.       Melena  All other systems reviewed and are negative.  Physical Exam Updated Vital Signs BP 138/86   Pulse 84   Temp 97.9 F (36.6 C) (Oral)   Resp 16   Ht 5\' 11"  (1.803 m)   Wt 77.1 kg   SpO2 100%   BMI 23.71 kg/m   Physical Exam Vitals and nursing note reviewed.  Constitutional:      General: He is not in acute distress.    Appearance: Normal appearance.  HENT:     Head: Normocephalic and  atraumatic.  Eyes:     General:        Right eye: No discharge.        Left eye: No discharge.  Cardiovascular:     Rate and Rhythm: Normal rate and regular rhythm.     Heart sounds: Murmur heard.    No friction rub. No gallop.  Pulmonary:     Effort: Pulmonary effort is normal.     Breath sounds: Normal breath sounds.  Abdominal:     General: Bowel sounds are normal.     Palpations: Abdomen is soft.     Comments: Tenderness to palpation focally worse in left lower quadrant.  Some discomfort throughout entire abdomen however.  No suprapubic tenderness.  No CVA tenderness.  No rebound, rigidity, guarding.  Skin:    General: Skin is warm and dry.     Capillary Refill: Capillary refill takes less than 2 seconds.  Neurological:     Mental Status: He is alert and oriented to person, place, and time.  Psychiatric:        Mood and Affect: Mood normal.        Behavior: Behavior normal.    ED Results / Procedures / Treatments   Labs (all labs ordered are listed, but only abnormal results are displayed) Labs Reviewed  COMPREHENSIVE METABOLIC PANEL - Abnormal; Notable for the following components:      Result Value   Chloride 97 (*)    Glucose, Bld 115 (*)    BUN 6 (*)    Total Bilirubin 1.5 (*)    All other components within normal limits  CBC WITH DIFFERENTIAL/PLATELET - Abnormal; Notable for the following components:    Neutro Abs 1.6 (*)    Eosinophils Absolute 0.6 (*)    All other components within normal limits  PROTIME-INR  POC OCCULT BLOOD, ED  TYPE AND SCREEN  TROPONIN I (HIGH SENSITIVITY)  TROPONIN I (HIGH SENSITIVITY)    EKG EKG Interpretation  Date/Time:  Thursday March 10 2021 07:14:08 EDT Ventricular Rate:  83 PR Interval:  192 QRS Duration: 86 QT Interval:  388 QTC Calculation: 455 R Axis:   -5 Text Interpretation: Sinus rhythm with Premature atrial complexes with Abberant conduction Nonspecific ST and T wave abnormality Abnormal ECG No significant change since last tracing Confirmed by Gareth Morgan [phone]) on 03/10/2021 2:38:59 PM  Radiology DG Chest 1 View  Result Date: 03/10/2021 CLINICAL DATA:  Midline chest pain EXAM: CHEST  1 VIEW COMPARISON:  11/10/2020 FINDINGS: Normal heart size and mediastinal contours. No acute infiltrate or edema. No effusion or pneumothorax. No acute osseous findings. IMPRESSION: Stable from prior.  No evidence of acute disease. Electronically Signed   By: Jorje Guild M.D.   On: 03/10/2021 07:41    Procedures Procedures   Medications Ordered in ED Medications  acetaminophen (TYLENOL) tablet 650 mg (650 mg Oral Given 03/10/21 0948)    ED Course  I have reviewed the triage vital signs and the nursing notes.  Pertinent labs & imaging results that were available during my care of the patient were reviewed by me and considered in my medical decision making (see chart for details).    MDM Rules/Calculators/A&P                         Briefly patient complained of chest pain, and abdominal pain on arrival.  Chest pain is located in upper chest, has been on and off for the last 2 weeks.  Patient denies radiation to neck, arm.  Patient denies history of prior heart attack.  Patient does have history of 2 prior catheterizations in 2013 in 2017 which showed a 30 to 40% stenosis of the RCA, with no other abnormality.  Patient also endorses lower  abdominal pain and black stools, on and off for the last 2 weeks.  Patient with questionable history of rectal cancer, it is reported on the patient's record, but he denies this.  Does have a known history of reflux.  Patient has not tried anything for the symptoms.  Patient does have a significant history of alcohol use, reports that he has cut back significantly.  He does report some drinking nightly at this time.  Given the large differential diagnosis for Wayden Schwertner, the decision making in this case is of high complexity.  After evaluating all of the data points in this case, the presentation of Jennie Bolar is NOT consistent with Acute Coronary Syndrome (ACS) and/or myocardial ischemia, pulmonary embolism, aortic dissection; Borhaave's, significant arrythmia, pneumothorax, cardiac tamponade, or other emergent cardiopulmonary condition.  Further, the presentation of Neiman Roots is NOT consistent with pericarditis, myocarditis, cholecystitis, pancreatitis, mediastinitis, endocarditis, new valvular disease.  Additionally, the presentation of Strider Vallance NOT consistent with flail chest, cardiac contusion, ARDS, or significant intra-thoracic or intra-abdominal bleeding.  Moreover, this presentation is NOT consistent with pneumonia, sepsis, or pyelonephritis.  The patient has a 2 negative delta troponins, nonischemic EKG.  Given the time of onset, do not favor cardiac cause of his chest pain at this time.  May be referred pain from ongoing reflux versus musculoskeletal pain.  This patient has some more focal tenderness over the left lower quadrant than any other abdominal region. We discussed that I would recommend CT of his abdomen at this time given his ongoing symptoms.  Patient has had a protracted stay in the emergency department, and does not want a wait for CT at this time.  Patient reports that he will try some Pepto-Bismol, and antacids, and agrees to follow-up with gastroenterology.   Extensive return precautions were given since we did not complete patient's work-up as we discussed.  Patient was discharged in stable condition.   Final Clinical Impression(s) / ED Diagnoses Final diagnoses:  Chest pain  Left lower quadrant abdominal pain    Rx / DC Orders ED Discharge Orders     None        Dorien Chihuahua 03/10/21 1903    Gareth Morgan, MD 03/12/21 [phone]

## 2021-03-10 NOTE — ED Provider Notes (Signed)
Emergency Medicine Provider Triage Evaluation Note  Andre [name] , a 80 y.o. male  was evaluated in triage.  Pt complains of lower abdominal pain with black stools x 2 weeks, also upper chest pain. Takes 81mg  ASA daily, no other thinners.  Review of Systems  Positive: Abdominal pain, CP, dark stools Negative: Fever, SHOB  Physical Exam  Ht 5\' 11"  (1.803 m)   Wt 77.1 kg   BMI 23.71 kg/m  Gen:   Awake, no distress   Resp:  Normal effort  MSK:   Moves extremities without difficulty  Other:  Generalized abdominal tenderness  Medical Decision Making  Medically screening exam initiated at 7:17 AM.  Appropriate orders placed.  Shaarav Ripple was informed that the remainder of the evaluation will be completed by another provider, this initial triage assessment does not replace that evaluation, and the importance of remaining in the ED until their evaluation is complete.     Tacy Learn, PA-C 03/10/21 0719    Lucrezia Starch, MD 03/10/21 [phone]

## 2021-03-10 NOTE — ED Notes (Signed)
Attempted to call CT to see when pt would be able to come over for scan

## 2021-03-23 DIAGNOSIS — K5900 Constipation, unspecified: Secondary | ICD-10-CM | POA: Diagnosis not present

## 2021-03-23 DIAGNOSIS — R109 Unspecified abdominal pain: Secondary | ICD-10-CM | POA: Diagnosis not present

## 2021-03-23 DIAGNOSIS — Z008 Encounter for other general examination: Secondary | ICD-10-CM | POA: Diagnosis not present

## 2021-03-28 ENCOUNTER — Other Ambulatory Visit: Payer: Self-pay

## 2021-03-28 ENCOUNTER — Encounter: Payer: Self-pay | Admitting: Internal Medicine

## 2021-03-28 ENCOUNTER — Ambulatory Visit (INDEPENDENT_AMBULATORY_CARE_PROVIDER_SITE_OTHER): Payer: Medicare Other | Admitting: Internal Medicine

## 2021-03-28 VITALS — BP 122/70 | HR 77 | Temp 98.5°F | Ht 71.0 in | Wt 167.8 lb

## 2021-03-28 DIAGNOSIS — J449 Chronic obstructive pulmonary disease, unspecified: Secondary | ICD-10-CM | POA: Diagnosis not present

## 2021-03-28 DIAGNOSIS — E538 Deficiency of other specified B group vitamins: Secondary | ICD-10-CM

## 2021-03-28 DIAGNOSIS — E1165 Type 2 diabetes mellitus with hyperglycemia: Secondary | ICD-10-CM | POA: Diagnosis not present

## 2021-03-28 DIAGNOSIS — E559 Vitamin D deficiency, unspecified: Secondary | ICD-10-CM | POA: Diagnosis not present

## 2021-03-28 DIAGNOSIS — R1013 Epigastric pain: Secondary | ICD-10-CM | POA: Insufficient documentation

## 2021-03-28 LAB — HEPATIC FUNCTION PANEL
ALT: 10 U/L (ref 0–53)
AST: 19 U/L (ref 0–37)
Albumin: 4.1 g/dL (ref 3.5–5.2)
Alkaline Phosphatase: 83 U/L (ref 39–117)
Bilirubin, Direct: 0.3 mg/dL (ref 0.0–0.3)
Total Bilirubin: 1.2 mg/dL (ref 0.2–1.2)
Total Protein: 7.3 g/dL (ref 6.0–8.3)

## 2021-03-28 LAB — BASIC METABOLIC PANEL WITH GFR
BUN: 8 mg/dL (ref 6–23)
CO2: 32 meq/L (ref 19–32)
Calcium: 9.5 mg/dL (ref 8.4–10.5)
Chloride: 96 meq/L (ref 96–112)
Creatinine, Ser: 0.89 mg/dL (ref 0.40–1.50)
GFR: 81.66 mL/min
Glucose, Bld: 86 mg/dL (ref 70–99)
Potassium: 3.4 meq/L — ABNORMAL LOW (ref 3.5–5.1)
Sodium: 135 meq/L (ref 135–145)

## 2021-03-28 LAB — CBC WITH DIFFERENTIAL/PLATELET
Basophils Absolute: 0 10*3/uL (ref 0.0–0.1)
Basophils Relative: 0.3 % (ref 0.0–3.0)
Eosinophils Absolute: 0.3 10*3/uL (ref 0.0–0.7)
Eosinophils Relative: 6.9 % — ABNORMAL HIGH (ref 0.0–5.0)
HCT: 46.7 % (ref 39.0–52.0)
Hemoglobin: 15.1 g/dL (ref 13.0–17.0)
Lymphocytes Relative: 41.3 % (ref 12.0–46.0)
Lymphs Abs: 2 10*3/uL (ref 0.7–4.0)
MCHC: 32.4 g/dL (ref 30.0–36.0)
MCV: 91.7 fl (ref 78.0–100.0)
Monocytes Absolute: 0.5 10*3/uL (ref 0.1–1.0)
Monocytes Relative: 10.3 % (ref 3.0–12.0)
Neutro Abs: 2 10*3/uL (ref 1.4–7.7)
Neutrophils Relative %: 41.2 % — ABNORMAL LOW (ref 43.0–77.0)
Platelets: 235 10*3/uL (ref 150.0–400.0)
RBC: 5.09 Mil/uL (ref 4.22–5.81)
RDW: 15.1 % (ref 11.5–15.5)
WBC: 4.9 10*3/uL (ref 4.0–10.5)

## 2021-03-28 LAB — URINALYSIS, ROUTINE W REFLEX MICROSCOPIC
Bilirubin Urine: NEGATIVE
Hgb urine dipstick: NEGATIVE
Ketones, ur: NEGATIVE
Leukocytes,Ua: NEGATIVE
Nitrite: NEGATIVE
RBC / HPF: NONE SEEN (ref 0–?)
Specific Gravity, Urine: 1.015 (ref 1.000–1.030)
Total Protein, Urine: NEGATIVE
Urine Glucose: NEGATIVE
Urobilinogen, UA: 0.2 (ref 0.0–1.0)
pH: 8 (ref 5.0–8.0)

## 2021-03-28 LAB — HEMOGLOBIN A1C: Hgb A1c MFr Bld: 6.4 % (ref 4.6–6.5)

## 2021-03-28 LAB — LIPASE: Lipase: 4 U/L — ABNORMAL LOW (ref 11.0–59.0)

## 2021-03-28 MED ORDER — SUCRALFATE 1 G PO TABS: 1.0000 g | ORAL_TABLET | Freq: Four times a day (QID) | ORAL | 0 refills | Status: DC

## 2021-03-28 NOTE — Progress Notes (Signed)
Patient ID: Andre [name], male   DOB: 07-Sep-1940, 80 y.o.   MRN: [id number]        Chief Complaint: follow up ED visit sept 29 with CP/abd pain       HPI:  Andre [name] is a 80 y.o. male here with c/o persistent epigastric pain, dull without radiation, moderate to occasaional severe, constant, no better with PPI, nothing else makes better or worse.  Pt denies chest pain, increased sob or doe, wheezing, orthopnea, PND, increased LE swelling, palpitations, dizziness or syncope.   Pt denies polydipsia, polyuria, or new focal neuro s/s.  Denies worsening reflux, dysphagia, bowel change or BRB or melena.  Has lost significant wt however - Peak wt in the past has been 186, most recently lost wt 10 lbs.       Wt Readings from Last 3 Encounters:  03/28/21 167 lb 12.8 oz (76.1 kg)  03/10/21 170 lb (77.1 kg)  12/31/20 177 lb 6.4 oz (80.5 kg)   BP Readings from Last 3 Encounters:  03/28/21 122/70  03/10/21 131/81  12/31/20 132/80         Past Medical History:  Diagnosis Date   Arthritis    Coronary artery disease, non-occlusive    a. 06/2011: cath for abnormal exercise echo: Only 30-40% proximal RCA. Otherwise normal. b. 10/2015: cath for abnormal NST and atypical CP --> showed mild 30-40% stenosis in the prox-distal RCA   GERD (gastroesophageal reflux disease)    HA (headache)    Hypertension    Memory loss    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[place])    TIA (transient ischemic attack)    Weakness    Past Surgical History:  Procedure Laterality Date   CARDIAC CATHETERIZATION  January 2013   Proximal RCA 30-40%. Otherwise normal.   CARDIAC CATHETERIZATION N/A 10/18/2015   Procedure: Left Heart Cath and Coronary Angiography;  Surgeon: Jettie Booze, MD;  Location: [facility];  Service: Cardiovascular;  Laterality: N/A;   NM MYOVIEW LTD  March 2015   LOW RISK. No scar or ischemia. Normal EF (55%). No RWMA   surgical excision of rectal cancer      reports that he has been smoking cigarettes. He has been  smoking an average of .25 packs per day. He has never used smokeless tobacco. He reports current alcohol use. He reports that he does not use drugs. family history includes Cancer in his mother; Hypertension in his mother; Liver disease in his father. No Known Allergies Current Outpatient Medications on File Prior to Visit  Medication Sig Dispense Refill   acetaminophen (TYLENOL) 500 MG tablet Take 500 mg by mouth every 6 (six) hours as needed.     amLODipine (NORVASC) 5 MG tablet Take 5 mg by mouth daily.     aspirin EC 81 MG tablet Take 1 tablet (81 mg total) by mouth daily.     atorvastatin (LIPITOR) 20 MG tablet Take 1 tablet (20 mg total) by mouth daily. 90 tablet 3   donepezil (ARICEPT) 10 MG tablet Take 1 tablet (10 mg total) by mouth at bedtime. 90 tablet 4   hydrochlorothiazide (HYDRODIURIL) 25 MG tablet TAKE 1 TABLET(25 MG) BY MOUTH DAILY 90 tablet 3   losartan (COZAAR) 100 MG tablet Take 1 tablet (100 mg total) by mouth daily. 90 tablet 3   meloxicam (MOBIC) 15 MG tablet Take 1 tablet (15 mg total) by mouth daily as needed for pain. 90 tablet 1   memantine (NAMENDA) 10 MG tablet Take 1  tablet (10 mg total) by mouth 2 (two) times daily. 60 tablet 11   omeprazole (PRILOSEC) 40 MG capsule Take 1 capsule (40 mg total) by mouth daily. 30 capsule 5   tizanidine (ZANAFLEX) 2 MG capsule Take 1 capsule (2 mg total) by mouth at bedtime as needed for muscle spasms. 30 capsule 5   triamcinolone (NASACORT) 55 MCG/ACT AERO nasal inhaler Place 2 sprays into the nose daily. 1 Inhaler 12   vitamin B-12 (CYANOCOBALAMIN) 1000 MCG tablet Take 1 tablet (1,000 mcg total) by mouth daily. 90 tablet 3   Vitamin D, Ergocalciferol, (DRISDOL) 1.25 MG (50000 UNIT) CAPS capsule TAKE 1 CAPSULE BY MOUTH EVERY 7 DAYS 10 capsule 0   No current facility-administered medications on file prior to visit.        ROS:  All others reviewed and negative.  Objective        PE:  BP 122/70 (BP Location: Right Arm, Patient  Position: Sitting, Cuff Size: Normal)   Pulse 77   Temp 98.5 F (36.9 C) (Oral)   Ht 5\' 11"  (1.803 m)   Wt 167 lb 12.8 oz (76.1 kg)   SpO2 100%   BMI 23.40 kg/m                 Constitutional: Pt appears in NAD               HENT: Head: NCAT.                Right Ear: External ear normal.                 Left Ear: External ear normal.                Eyes: . Pupils are equal, round, and reactive to light. Conjunctivae and EOM are normal               Nose: without d/c or deformity               Neck: Neck supple. Gross normal ROM               Cardiovascular: Normal rate and regular rhythm.                 Pulmonary/Chest: Effort normal and breath sounds without rales or wheezing.                Abd:  Soft, mild epigastric tender, ND, + BS, no organomegaly               Neurological: Pt is alert. At baseline orientation, motor grossly intact               Skin: Skin is warm. No rashes, no other new lesions, LE edema - none               Psychiatric: Pt behavior is normal without agitation   Micro: none  Cardiac tracings I have personally interpreted today:  none  Pertinent Radiological findings (summarize): none   Lab Results  Component Value Date   WBC 4.9 03/28/2021   HGB 15.1 03/28/2021   HCT 46.7 03/28/2021   PLT 235.0 03/28/2021   GLUCOSE 86 03/28/2021   CHOL 143 12/31/2020   TRIG 107.0 12/31/2020   HDL 37.90 (L) 12/31/2020   LDLCALC 84 12/31/2020   ALT 10 03/28/2021   AST 19 03/28/2021   NA 135 03/28/2021   K 3.4 (L) 03/28/2021   CL 96 03/28/2021  CREATININE 0.89 03/28/2021   BUN 8 03/28/2021   CO2 32 03/28/2021   TSH 1.07 12/31/2020   PSA 0.68 12/31/2020   INR 1.0 03/10/2021   HGBA1C 6.4 03/28/2021   MICROALBUR 1.2 12/31/2020   Assessment/Plan:  Andre [name] is a 80 y.o. Black or African American [2] male with  has a past medical history of Arthritis, Coronary artery disease, non-occlusive, GERD (gastroesophageal reflux disease), HA (headache),  Hypertension, Memory loss, 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[place]), TIA (transient ischemic attack), and Weakness.  Epigastric pain Persistent, etiology unclear, cant r/o gastritis or other, for carafate qid x 1 mo, CT abd/pelvis, refer GI,  to f/u any worsening symptoms or concerns    COPD GOLD 0 Stable, cont current med t and albuterol hfa prn,  to f/u any worsening symptoms or concerns    Diabetes [facility]) Lab Results  Component Value Date   HGBA1C 6.4 03/28/2021   Stable, pt to continue current medical treatment  - diet   Vitamin D deficiency Last vitamin D Lab Results  Component Value Date   VD25OH 23.34 (L) 12/31/2020   Low, to start oral replacement   Vitamin B12 deficiency Lab Results  Component Value Date   VITAMINB12 211 12/31/2020   Low, to start oral replacement - b12 1000 mcg qd  Followup: Return in about 3 months (around 06/28/2021).  Cathlean Cower, MD 03/29/2021 8:37 PM [facility]

## 2021-03-28 NOTE — Patient Instructions (Signed)
Please take all new medication as prescribed - the carafate four time per day for 1 month only  Please continue all other medications as before, and refills have been done if requested.  Please have the pharmacy call with any other refills you may need.  Please keep your appointments with your specialists as you may have planned  You will be contacted regarding the referral for: CT abdomen/pelvis, and Gastroenterology referral  Please go to the LAB at the blood drawing area for the tests to be done  You will be contacted by phone if any changes need to be made immediately.  Otherwise, you will receive a letter about your results with an explanation, but please check with MyChart first.  Please remember to sign up for MyChart if you have not done so, as this will be important to you in the future with finding out test results, communicating by private email, and scheduling acute appointments online when needed.  Please make an Appointment to return in 3 months, or sooner if needed

## 2021-03-29 ENCOUNTER — Encounter: Payer: Self-pay | Admitting: Internal Medicine

## 2021-03-29 NOTE — Assessment & Plan Note (Signed)
Last vitamin D Lab Results  Component Value Date   VD25OH 23.34 (L) 12/31/2020   Low, to start oral replacement

## 2021-03-29 NOTE — Assessment & Plan Note (Signed)
Lab Results  Component Value Date   VITAMINB12 211 12/31/2020   Low, to start oral replacement - b12 1000 mcg qd

## 2021-03-29 NOTE — Assessment & Plan Note (Signed)
Lab Results  Component Value Date   HGBA1C 6.4 03/28/2021   Stable, pt to continue current medical treatment  - diet

## 2021-03-29 NOTE — Assessment & Plan Note (Signed)
Persistent, etiology unclear, cant r/o gastritis or other, for carafate qid x 1 mo, CT abd/pelvis, refer GI,  to f/u any worsening symptoms or concerns

## 2021-03-29 NOTE — Assessment & Plan Note (Signed)
Stable, cont current med t and albuterol hfa prn,  to f/u any worsening symptoms or concerns

## 2021-04-01 ENCOUNTER — Telehealth: Payer: Self-pay | Admitting: Internal Medicine

## 2021-04-01 NOTE — Telephone Encounter (Signed)
Spoke with patient and advised that someone from imaging and GI will be contacting him soon with appointments.

## 2021-04-01 NOTE — Telephone Encounter (Signed)
Patient calling to check status on referral made on 03-28-2021  Patient states right side of stomach has a burning pain that is consistently getting worse  Patient states he is constipated and has to take a "a lot" of laxatives to have a bowel movement  Patient states stool is black  Patient states he was seen in office on 03-28-2021 for same symptoms    Patient is requesting advise and a call back [phone]

## 2021-04-03 ENCOUNTER — Emergency Department (HOSPITAL_COMMUNITY): Payer: Medicare Other

## 2021-04-03 ENCOUNTER — Encounter (HOSPITAL_COMMUNITY): Payer: Self-pay

## 2021-04-03 ENCOUNTER — Emergency Department (HOSPITAL_COMMUNITY)
Admission: EM | Admit: 2021-04-03 | Discharge: 2021-04-04 | Disposition: A | Discharge status: Home / Self Care | Payer: Medicare Other | Attending: Emergency Medicine | Admitting: Emergency Medicine

## 2021-04-03 ENCOUNTER — Other Ambulatory Visit: Payer: Self-pay

## 2021-04-03 DIAGNOSIS — N2889 Other specified disorders of kidney and ureter: Secondary | ICD-10-CM

## 2021-04-03 DIAGNOSIS — R1011 Right upper quadrant pain: Secondary | ICD-10-CM

## 2021-04-03 DIAGNOSIS — Z7982 Long term (current) use of aspirin: Secondary | ICD-10-CM | POA: Diagnosis not present

## 2021-04-03 DIAGNOSIS — J449 Chronic obstructive pulmonary disease, unspecified: Secondary | ICD-10-CM | POA: Insufficient documentation

## 2021-04-03 DIAGNOSIS — F17210 Nicotine dependence, cigarettes, uncomplicated: Secondary | ICD-10-CM | POA: Diagnosis not present

## 2021-04-03 DIAGNOSIS — I2510 Atherosclerotic heart disease of native coronary artery without angina pectoris: Secondary | ICD-10-CM | POA: Insufficient documentation

## 2021-04-03 DIAGNOSIS — R109 Unspecified abdominal pain: Secondary | ICD-10-CM | POA: Diagnosis not present

## 2021-04-03 DIAGNOSIS — I10 Essential (primary) hypertension: Secondary | ICD-10-CM | POA: Diagnosis not present

## 2021-04-03 DIAGNOSIS — N200 Calculus of kidney: Secondary | ICD-10-CM | POA: Diagnosis not present

## 2021-04-03 DIAGNOSIS — K760 Fatty (change of) liver, not elsewhere classified: Secondary | ICD-10-CM | POA: Diagnosis not present

## 2021-04-03 DIAGNOSIS — K8020 Calculus of gallbladder without cholecystitis without obstruction: Secondary | ICD-10-CM | POA: Insufficient documentation

## 2021-04-03 LAB — COMPREHENSIVE METABOLIC PANEL
ALT: 14 U/L (ref 0–44)
AST: 20 U/L (ref 15–41)
Albumin: 4.3 g/dL (ref 3.5–5.0)
Alkaline Phosphatase: 88 U/L (ref 38–126)
Anion gap: 6 (ref 5–15)
BUN: 11 mg/dL (ref 8–23)
CO2: 31 mmol/L (ref 22–32)
Calcium: 9.4 mg/dL (ref 8.9–10.3)
Chloride: 98 mmol/L (ref 98–111)
Creatinine, Ser: 0.72 mg/dL (ref 0.61–1.24)
GFR, Estimated: >60 mL/min (ref >60)
Glucose, Bld: 95 mg/dL (ref 70–99)
Potassium: 3.4 mmol/L — ABNORMAL LOW (ref 3.5–5.1)
Sodium: 135 mmol/L (ref 135–145)
Total Bilirubin: 1.2 mg/dL (ref 0.3–1.2)
Total Protein: 8 g/dL (ref 6.5–8.1)

## 2021-04-03 LAB — CBC
HCT: 48.7 % (ref 39.0–52.0)
Hemoglobin: 16 g/dL (ref 13.0–17.0)
MCH: 29.8 pg (ref 26.0–34.0)
MCHC: 32.9 g/dL (ref 30.0–36.0)
MCV: 90.7 fL (ref 80.0–100.0)
Platelets: 202 10*3/uL (ref 150–400)
RBC: 5.37 MIL/uL (ref 4.22–5.81)
RDW: 15 % (ref 11.5–15.5)
WBC: 5.6 10*3/uL (ref 4.0–10.5)
nRBC: 0 % (ref 0.0–0.2)

## 2021-04-03 LAB — URINALYSIS, ROUTINE W REFLEX MICROSCOPIC
Bilirubin Urine: NEGATIVE
Glucose, UA: NEGATIVE mg/dL
Hgb urine dipstick: NEGATIVE
Ketones, ur: 5 mg/dL — AB
Leukocytes,Ua: NEGATIVE
Nitrite: NEGATIVE
Protein, ur: NEGATIVE mg/dL
Specific Gravity, Urine: 1.018 (ref 1.005–1.030)
pH: 7 (ref 5.0–8.0)

## 2021-04-03 LAB — LIPASE, BLOOD: Lipase: 28 U/L (ref 11–51)

## 2021-04-03 MED ORDER — IOHEXOL 350 MG/ML SOLN
75.0000 mL | Freq: Once | INTRAVENOUS | Status: AC | PRN
  Administered 2021-04-03: 75 mL via INTRAVENOUS

## 2021-04-03 NOTE — ED Provider Notes (Signed)
[facility] DEPT Provider Note   CSN: [id number] Arrival date & time: 04/03/21  2947     History Chief Complaint  Patient presents with  . Abdominal Pain    Andre [name] is a 80 y.o. male.  80 year old male with history of GERD, HTN, CAD, no prior abdominal surgeries presents with complaint of pain in his right lower quadrant for the past 2 weeks or so.  Patient states that he has been going to his doctor however pain is getting progressively worse.  Pain occasionally radiates to epigastric area, throbbing in nature, no pain associated with eating.  Denies changes in bowel or bladder habits, nausea, vomiting, fevers or chills.  Patient is taking Pepto-Bismol and laxatives without relief of his pain.  Patient states that he is lost about 20 pounds in the past month or so.  Reports his last colonoscopy was about 10 years ago.  No other complaints or concerns.      Past Medical History:  Diagnosis Date  . Arthritis   . Coronary artery disease, non-occlusive    a. 06/2011: cath for abnormal exercise echo: Only 30-40% proximal RCA. Otherwise normal. b. 10/2015: cath for abnormal NST and atypical CP --> showed mild 30-40% stenosis in the prox-distal RCA  . GERD (gastroesophageal reflux disease)   . HA (headache)   . Hypertension   . Memory loss   . 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[place])   . TIA (transient ischemic attack)   . Weakness     Patient Active Problem List   Diagnosis Date Noted  . Epigastric pain 03/28/2021  . Pain due to onychomycosis of toenails of both feet 01/14/2021  . Blurry vision, bilateral 12/31/2020  . Nail disorder 12/31/2020  . Left shoulder pain 02/11/2020  . Right shoulder pain 02/11/2020  . Vitamin B12 deficiency 12/17/2019  . Mild cognitive impairment 12/17/2019  . Peripheral neuropathy 12/17/2019  . Vitamin D deficiency 12/01/2019  . Patellar tendonitis of left knee 12/01/2019  . Allergic rhinitis 10/07/2019  . Dementia ([place])  10/07/2019  . CAD (coronary artery disease) 09/30/2019  . Amnesia 09/10/2019  . Right-sided nosebleed 08/07/2019  . Encounter for well adult exam with abnormal findings 08/07/2019  . Leg cramping 08/07/2019  . Chest pain 03/23/2019  . Epistaxis 03/23/2019  . Hyperlipidemia 12/24/2017  . Alcohol use 12/24/2017  . Non-compliance 12/24/2017  . 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 Left lumbar radiculopathy 08/02/2017  . Atherosclerosis 06/03/2017  . Low-level of literacy 05/12/2017  . Osteoarthritis 04/20/2017  . Diabetes ([place]) 03/12/2017  . Back pain 02/29/2016  . Abnormal stress test 10/18/2015  . Erectile dysfunction 02/25/2015  . Cigarette smoker 02/10/2015  . COPD GOLD 0 11/08/2014  . Hemorrhoids 02/11/2014  . 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 Headache 10/23/2011  . 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[place]) 10/23/2011  . GERD (gastroesophageal reflux disease) 10/23/2011  . Nicotine addiction 10/23/2011  . 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 Cervical stenosis of spinal canal 10/23/2011  . Essential hypertension, benign 07/10/2011    Past Surgical History:  Procedure Laterality Date  . CARDIAC CATHETERIZATION  January 2013   Proximal RCA 30-40%. Otherwise normal.  . CARDIAC CATHETERIZATION N/A 10/18/2015   Procedure: Left Heart Cath and Coronary Angiography;  Surgeon: Jettie Booze, MD;  Location: [facility];  Service: Cardiovascular;  Laterality: N/A;  . NM MYOVIEW LTD  March 2015   LOW RISK. No scar or ischemia. Normal EF (55%). No RWMA  . surgical excision  of rectal cancer         Family History  Problem Relation Age of Onset  . Hypertension Mother   . Cancer Mother   . Liver disease Father     Social History   Tobacco Use  . Smoking status: Some Days    Packs/day: 0.25    Types: Cigarettes  . Smokeless tobacco: Never  Vaping Use  . Vaping Use: Never used  Substance Use Topics  . Alcohol use: Yes    Comment: drinks "every now and then"  .  Drug use: No    Home Medications Prior to Admission medications   Medication Sig Start Date End Date Taking? Authorizing Provider  oxycodone (OXY-IR) 5 MG capsule Take 1 capsule (5 mg total) by mouth every 4 (four) hours as needed. 04/04/21  Yes Tacy Learn, PA-C  acetaminophen (TYLENOL) 500 MG tablet Take 500 mg by mouth every 6 (six) hours as needed.    [provider]  amLODipine (NORVASC) 5 MG tablet Take 5 mg by mouth daily. 08/27/20   [provider]  aspirin EC 81 MG tablet Take 1 tablet (81 mg total) by mouth daily. 03/24/19   Binnie Rail, MD  atorvastatin (LIPITOR) 20 MG tablet Take 1 tablet (20 mg total) by mouth daily. 08/07/19   Biagio Borg, MD  donepezil (ARICEPT) 10 MG tablet Take 1 tablet (10 mg total) by mouth at bedtime. 12/17/19   Marcial Pacas, MD  hydrochlorothiazide (HYDRODIURIL) 25 MG tablet TAKE 1 TABLET(25 MG) BY MOUTH DAILY 08/28/20   Biagio Borg, MD  losartan (COZAAR) 100 MG tablet Take 1 tablet (100 mg total) by mouth daily. 08/07/19   Biagio Borg, MD  meloxicam (MOBIC) 15 MG tablet Take 1 tablet (15 mg total) by mouth daily as needed for pain. 02/11/20   Biagio Borg, MD  memantine (NAMENDA) 10 MG tablet Take 1 tablet (10 mg total) by mouth 2 (two) times daily. 12/17/19   Marcial Pacas, MD  omeprazole (PRILOSEC) 40 MG capsule Take 1 capsule (40 mg total) by mouth daily. 09/10/19   Binnie Rail, MD  sucralfate (CARAFATE) 1 g tablet Take 1 tablet (1 g total) by mouth 4 (four) times daily. 03/28/21   Biagio Borg, MD  tizanidine (ZANAFLEX) 2 MG capsule Take 1 capsule (2 mg total) by mouth at bedtime as needed for muscle spasms. 08/07/19   Biagio Borg, MD  triamcinolone (NASACORT) 55 MCG/ACT AERO nasal inhaler Place 2 sprays into the nose daily. 10/07/19   Biagio Borg, MD  vitamin B-12 (CYANOCOBALAMIN) 1000 MCG tablet Take 1 tablet (1,000 mcg total) by mouth daily. 12/01/19   Biagio Borg, MD  Vitamin D, Ergocalciferol, (DRISDOL) 1.25 MG (50000 UNIT)  CAPS capsule TAKE 1 CAPSULE BY MOUTH EVERY 7 DAYS 12/15/20   Suzzanne Cloud, NP    Allergies    Patient has no known allergies.  Review of Systems   Review of Systems  Constitutional:  Positive for unexpected weight change. Negative for appetite change, chills and fever.  Respiratory:  Negative for shortness of breath.   Cardiovascular:  Negative for chest pain.  Gastrointestinal:  Positive for abdominal pain. Negative for constipation, diarrhea, nausea and vomiting.  Genitourinary:  Negative for dysuria and frequency.  Musculoskeletal:  Negative for arthralgias and myalgias.  Skin:  Negative for rash and wound.  Allergic/Immunologic: Negative for immunocompromised state.  Neurological:  Negative for weakness.  Hematological:  Negative for adenopathy.  All other systems reviewed and are negative.  Physical Exam Updated Vital Signs BP 113/67   Pulse 63   Temp 98.3 F (36.8 C) (Oral)   Resp 16   SpO2 100%   Physical Exam Vitals and nursing note reviewed.  Constitutional:      General: He is not in acute distress.    Appearance: He is well-developed. He is not diaphoretic.  HENT:     Head: Normocephalic and atraumatic.  Cardiovascular:     Rate and Rhythm: Normal rate and regular rhythm.     Heart sounds: Normal heart sounds.  Pulmonary:     Effort: Pulmonary effort is normal.     Breath sounds: Normal breath sounds.  Abdominal:     Palpations: Abdomen is soft.     Tenderness: There is abdominal tenderness in the right upper quadrant. There is no right CVA tenderness or left CVA tenderness. Positive signs include 's sign.     Hernia: A hernia is present. Hernia is present in the umbilical area.  Skin:    General: Skin is warm and dry.     Findings: No erythema or rash.  Neurological:     Mental Status: He is alert and oriented to person, place, and time.  Psychiatric:        Behavior: Behavior normal.    ED Results / Procedures / Treatments   Labs (all labs  ordered are listed, but only abnormal results are displayed) Labs Reviewed  COMPREHENSIVE METABOLIC PANEL - Abnormal; Notable for the following components:      Result Value   Potassium 3.4 (*)    All other components within normal limits  URINALYSIS, ROUTINE W REFLEX MICROSCOPIC - Abnormal; Notable for the following components:   APPearance HAZY (*)    Ketones, ur 5 (*)    All other components within normal limits  LIPASE, BLOOD  CBC    EKG None  Radiology CT ABDOMEN PELVIS W CONTRAST  Result Date: 04/03/2021 CLINICAL DATA:  Right lower quadrant abdominal pain. Pain for 3 weeks. Patient reports 20 pound weight loss. EXAM: CT ABDOMEN AND PELVIS WITH CONTRAST TECHNIQUE: Multidetector CT imaging of the abdomen and pelvis was performed using the standard protocol following bolus administration of intravenous contrast. CONTRAST:  24m OMNIPAQUE IOHEXOL 350 MG/ML SOLN COMPARISON:  CT 08/08/2011 FINDINGS: Lower chest: Mild cardiomegaly with primarily right heart dilatation. Hypoventilatory changes in the dependent lungs. No pleural fluid or confluent consolidation. Hepatobiliary: Mild hepatic steatosis, but less prominent than on prior exam. There is ill-defined low-density adjacent to the gallbladder fossa. The gallbladder is only partially distended. There is intraluminal layering density. Suggestion of diffuse gallbladder wall thickening. Common bile duct is dilated measuring up to 13 mm. There is central intrahepatic biliary ductal dilatation. No visualized choledocholithiasis. Pancreas: Rounded low-density appears to be adjacent to the pancreatic head rather than within measuring 15 mm short axis, series 2, image 24. This is felt to represent necrotic lymph node rather than a pancreatic mass. No peripancreatic fat stranding. Pancreatic duct is nondilated at 2 mm. Spleen: Normal in size without focal abnormality. Adrenals/Urinary Tract: No adrenal nodule. Enhancing right renal mass measures 3 x  2.6 cm in the mid right kidney. There is no convincing renal vein invasion. There is also a simple cyst in the lower right kidney. No hydronephrosis. No visualized renal stone. No focal abnormality in the left kidney. Urinary bladder is unremarkable. Stomach/Bowel: The stomach is nondistended. There is a suspected periampullary duodenal diverticulum. Occasional fecalization  of small bowel contents. The appendix is normal. No terminal ileal inflammation. Left colonic diverticulosis without diverticulitis. The sigmoid colon is redundant and courses into the right abdomen. No colonic inflammation or obvious colonic mass. Vascular/Lymphatic: Moderate aortic and branch atherosclerosis. No aortic aneurysm. There is presumed contrast mixing in the IVC at the level of the renal veins. No thrombus is seen on delayed phase imaging. The portal vein is patent. Multiple enlarged lymph nodes in the abdomen, many of which appear necrotic. Retrocaval node at the level of the portal vein measures 15 mm short axis, series 2, image 21. 16 mm short axis peripancreatic node just anterior to the renal vein IVC confluence, series 2, image 24. Rounded soft tissue density adjacent to the stomach and left lobe of the liver likely represents adenopathy, series 2, image 21, but poorly defined. There is also ill-defined soft tissue adjacent to the superior mesenteric artery, series 2, image 18 with small soft tissue nodules/nodes in the lesser sac, series 2, image 15. Prominent lymph nodes are also seen in these regions on remote prior exam, however lymph nodes now appear low-density/necrotic. Reproductive: Prominent prostate gland spans 5 cm transverse. Other: No ascites, free air, or focal fluid collection. No abdominal wall hernia. Musculoskeletal: Sclerotic focus in the right iliac bone was present on prior exam and is typical of bone island. No lytic lesion. Advanced degenerative change throughout the lumbar spine. Moderate degenerative  change of both hips. IMPRESSION: 1. Enhancing right renal mass measuring 3 x 2.6 cm, highly suspicious for renal cell carcinoma. Recommend urology consultation. 2. Abnormal appearance of the gallbladder which is only partially distended. There is intraluminal density as well as suggestion of diffuse wall thickening. The adjacent liver parenchyma demonstrates decreased density which may be edema. In addition, there is intra and extrahepatic biliary ductal dilatation. No visualized choledocholithiasis. Recommend correlation with LFTs, as well as further assessment with MRCP. MRCP should only be considered when patient is able to tolerate breath hold technique 3. Upper abdominal adenopathy, some of which appears low-density/necrotic, measuring up to 16 mm. These are nonspecific, given renal and gallbladder findings, metastatic disease is not entirely excluded. 4. Presumed contrast mixing in the renal veins and IVC. 5. Colonic diverticulosis without diverticulitis. 6. Aortic and branch atherosclerosis. 7. Mildly enlarged prostate gland. Electronically Signed   By: Keith Rake M.D.   On: 04/03/2021 23:52   US Abdomen Limited  Result Date: 04/04/2021 CLINICAL DATA:  Right upper quadrant abdominal pain. EXAM: ULTRASOUND ABDOMEN LIMITED RIGHT UPPER QUADRANT COMPARISON:  CT dated 04/03/2021 FINDINGS: Gallbladder: The gallbladder is contracted. There is diffuse thickened appearance of the gallbladder wall measuring 9 mm in thickness. There is a 2 cm stone within the gallbladder. Small pericholecystic fluid versus gallbladder wall edema. Positive sonographic 's sign reported. Common bile duct: Diameter: 12 mm Liver: There is diffuse increased liver echogenicity most commonly seen in the setting of fatty infiltration. Superimposed inflammation or fibrosis is not excluded. Clinical correlation is recommended. Portal vein is patent on color Doppler imaging with normal direction of blood flow towards the liver.  Other: A 3.0 x 2.7 x 2.9 cm right renal mass in keeping with described neoplasm on CT. IMPRESSION: 1. Cholelithiasis with findings suggestive of acute cholecystitis. A hepatobiliary scintigraphy may provide better evaluation of the gallbladder if there is a high clinical concern for acute cholecystitis . 2. Fatty liver. 3. Right renal mass. Electronically Signed   By: Anner Crete M.D.   On: 04/04/2021 01:11  Procedures Procedures   Medications Ordered in ED Medications  alum & mag hydroxide-simeth (MAALOX/MYLANTA) 200-200-20 MG/5ML suspension 30 mL (has no administration in time range)    And  lidocaine (XYLOCAINE) 2 % viscous mouth solution 15 mL (has no administration in time range)  iohexol (OMNIPAQUE) 350 MG/ML injection 75 mL (75 mLs Intravenous Contrast Given 04/03/21 2309)    ED Course  I have reviewed the triage vital signs and the nursing notes.  Pertinent labs & imaging results that were available during my care of the patient were reviewed by me and considered in my medical decision making (see chart for details).  Clinical Course as of 04/04/21 0247  Mon Apr 05, 4715  3829 80 year old male with complaint of right upper quadrant abdominal pain x2 weeks with reported unintentional weight loss.  Found to have tenderness right upper quadrant with positive  sign.  CT abdomen pelvis concerning for possible acute cholecystitis, found to have new right renal mass. Discussed with Dr. Florina Ou, ER attending who has examined the patient and recommends right upper quadrant ultrasound.  Ultrasound also concerning for acute cholecystitis. Case discussed with Dr. Rosendo Gros, on-call with surgery, feels less likely acute cholecystitis as labs are normal including white count and LFTs.  Suggest possible HIDA scan follow-up. [LM]  0245 Discussed results with patient. Patient is driving self tonight, will give GI cocktail for pain with rx for oxycodone to his pharmacy.  Patient understands he  has new diagnosis of right renal mass and needs to follow-up with PCP and urology for further work-up of this. Discussed gallstones and gallbladder, discussed with surgery, does not feel he needs surgery at this time however may need further work-up with GI, referred back to patient's GI doctor, return to ER precautions including fever, worsening pain or other concerns.  Labs are reviewed, CBC is normal, lipase normal, CMP with normal LFTs, alk phos, bili, no other significant findings.  Urinalysis with ketones otherwise normal. Vitals reviewed and reassuring including room air sat of 100% O2. [LM]    Clinical Course User Index [LM] Roque Lias   MDM Rules/Calculators/A&P                           Final Clinical Impression(s) / ED Diagnoses Final diagnoses:  Right upper quadrant abdominal pain  Renal mass  Calculus of gallbladder without cholecystitis without obstruction    Rx / DC Orders ED Discharge Orders          Ordered    oxycodone (OXY-IR) 5 MG capsule  Every 4 hours PRN        04/04/21 0226             Tacy Learn, PA-C 04/04/21 0247    Molpus, John, MD 04/11/21 2228

## 2021-04-03 NOTE — ED Triage Notes (Signed)
Complains of abdominal pain x3 weeks. Reports dark green stools. States he has lost approx 20 lbs in the past 6 weeks. Denies blood in BM or fevers.

## 2021-04-03 NOTE — ED Notes (Signed)
Pt returned from CT °

## 2021-04-03 NOTE — ED Provider Notes (Signed)
Emergency Medicine Provider Triage Evaluation Note  Andre [name] , a 80 y.o. male  was evaluated in triage.  Pt complains of right lower quadrant abdominal pain that has been intermittent for the last 2 weeks.  Was seen evaluated at his primary care doctor who got some blood work.  Unknown results.  Reports associated dark green stools with associated weight loss.  No nausea, vomiting, cough, congestion, urinary complaints, shortness of breath, or chest pain.  Does admit to reflux symptoms.  Review of Systems  Positive:  Negative: See above   Physical Exam  BP 132/90 (BP Location: Left Arm)   Pulse 81   Temp 98.3 F (36.8 C) (Oral)   Resp 16   SpO2 100%  Gen:   Awake, no distress   Resp:  Normal effort  MSK:   Moves extremities without difficulty  Other:  Right lower quadrant abdominal tenderness  Medical Decision Making  Medically screening exam initiated at 7:18 PM.  Appropriate orders placed.  Andre [name] was informed that the remainder of the evaluation will be completed by another provider, this initial triage assessment does not replace that evaluation, and the importance of remaining in the ED until their evaluation is complete.     Hendricks Limes, PA-C 04/03/21 1919    Jeanell Sparrow, DO 04/03/21 2335

## 2021-04-04 ENCOUNTER — Emergency Department (HOSPITAL_COMMUNITY): Payer: Medicare Other

## 2021-04-04 DIAGNOSIS — K8020 Calculus of gallbladder without cholecystitis without obstruction: Secondary | ICD-10-CM | POA: Diagnosis not present

## 2021-04-04 DIAGNOSIS — R1011 Right upper quadrant pain: Secondary | ICD-10-CM | POA: Diagnosis not present

## 2021-04-04 DIAGNOSIS — K760 Fatty (change of) liver, not elsewhere classified: Secondary | ICD-10-CM | POA: Diagnosis not present

## 2021-04-04 MED ORDER — OXYCODONE HCL 5 MG PO CAPS: 5.0000 mg | ORAL_CAPSULE | ORAL | 0 refills | Status: DC | PRN

## 2021-04-04 MED ORDER — CEFTRIAXONE SODIUM 2 G IJ SOLR: 2.0000 g | Freq: Once | INTRAMUSCULAR | Status: DC

## 2021-04-04 MED ORDER — ALUM & MAG HYDROXIDE-SIMETH 200-200-20 MG/5ML PO SUSP
30.0000 mL | Freq: Once | ORAL | Status: AC
  Administered 2021-04-04: 30 mL via ORAL
  Filled 2021-04-04: qty 30

## 2021-04-04 MED ORDER — LIDOCAINE VISCOUS HCL 2 % MT SOLN
15.0000 mL | Freq: Once | OROMUCOSAL | Status: AC
  Administered 2021-04-04: 15 mL via ORAL
  Filled 2021-04-04: qty 15

## 2021-04-04 NOTE — ED Notes (Signed)
US at bedside

## 2021-04-04 NOTE — Discharge Instructions (Addendum)
Your work-up today is concerning for a mass on your right kidney.  Recommend following up with your doctor as well as seeing urology.  You can follow-up with GI regarding your right upper quadrant abdominal pain.  They may want to consider further tests on your gallbladder.  Return to the emergency room for fever, worsening pain or other concerns.  Limited use of oxycodone as needed for pain.  This medication can cause constipation, managed with MiraLAX or Colace as needed.  Do not drive or operate machinery if taking oxycodone.

## 2021-04-05 DIAGNOSIS — R599 Enlarged lymph nodes, unspecified: Secondary | ICD-10-CM | POA: Diagnosis not present

## 2021-04-05 DIAGNOSIS — D49511 Neoplasm of unspecified behavior of right kidney: Secondary | ICD-10-CM | POA: Diagnosis not present

## 2021-04-06 ENCOUNTER — Other Ambulatory Visit: Payer: Self-pay

## 2021-04-06 ENCOUNTER — Ambulatory Visit (INDEPENDENT_AMBULATORY_CARE_PROVIDER_SITE_OTHER): Payer: Medicare Other | Admitting: Internal Medicine

## 2021-04-06 ENCOUNTER — Encounter: Payer: Self-pay | Admitting: Internal Medicine

## 2021-04-06 VITALS — BP 138/66 | HR 56 | Ht 71.0 in | Wt 165.0 lb

## 2021-04-06 DIAGNOSIS — R1011 Right upper quadrant pain: Secondary | ICD-10-CM | POA: Diagnosis not present

## 2021-04-06 DIAGNOSIS — R1013 Epigastric pain: Secondary | ICD-10-CM | POA: Diagnosis not present

## 2021-04-06 DIAGNOSIS — N2889 Other specified disorders of kidney and ureter: Secondary | ICD-10-CM | POA: Diagnosis not present

## 2021-04-06 DIAGNOSIS — F03B0 Unspecified dementia, moderate, without behavioral disturbance, psychotic disturbance, mood disturbance, and anxiety: Secondary | ICD-10-CM

## 2021-04-06 DIAGNOSIS — N289 Disorder of kidney and ureter, unspecified: Secondary | ICD-10-CM | POA: Insufficient documentation

## 2021-04-06 NOTE — Assessment & Plan Note (Signed)
To continue PPI

## 2021-04-06 NOTE — Assessment & Plan Note (Signed)
Persistent symptoms, u/s abnormal c/w acute cholecystitis, cbc recent normal, will cont pain control and refer GI as wel as General Surgury

## 2021-04-06 NOTE — Patient Instructions (Signed)
Please continue all other medications as before, and refills have been done if requested.  Please have the pharmacy call with any other refills you may need.  Please continue your efforts at being more active, low cholesterol diet, and weight control.  Please keep your appointments with your specialists as you may have planned  You will be contacted regarding the referral for: Gastroenterology, [place]

## 2021-04-06 NOTE — Progress Notes (Signed)
Patient ID: Andre [name], male   DOB: August 18, 1940, 80 y.o.   MRN: [id number]        Chief Complaint: follow up recent ED visit oct 23       HPI:  Andre [name] is a 80 y.o. male here with daughter to assist due to dementia; pt with c/o persistent RUQ pain after ED visit oct 23, cbc normal, U/S c/w acute cholecystitis and CT abd/pelvis also c/w right renal mass and associate LA.Marland Kitchen  Has already [facility] urology yesterday, will be for PET scan, then f/u urology Nov 17.  Has been recommended for f/u. GI but Dr Benson Norway not taking new patients at this time.  Needs new referral.  Tolerating PPI well, but upper and ruq pain persists, dull, constant, mod to severe but no fever, vomiting, blood or bowel change  Dementia overall stable symptomatically, and not assoc with behavioral changes such as hallucinations, paranoia, or agitation.  Has lost significant wt in 2 mo, approx 20 lbs per pt .Pt denies chest pain, increased sob or doe, wheezing, orthopnea, PND, increased LE swelling, palpitations, dizziness or syncope.   Pt denies polydipsia, polyuria,   Wt Readings from Last 3 Encounters:  04/06/21 165 lb (74.8 kg)  03/28/21 167 lb 12.8 oz (76.1 kg)  03/10/21 170 lb (77.1 kg)   BP Readings from Last 3 Encounters:  04/06/21 138/66  04/04/21 115/70  03/28/21 122/70         Past Medical History:  Diagnosis Date   Arthritis    Coronary artery disease, non-occlusive    a. 06/2011: cath for abnormal exercise echo: Only 30-40% proximal RCA. Otherwise normal. b. 10/2015: cath for abnormal NST and atypical CP --> showed mild 30-40% stenosis in the prox-distal RCA   GERD (gastroesophageal reflux disease)    HA (headache)    Hypertension    Memory loss    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[place])    TIA (transient ischemic attack)    Weakness    Past Surgical History:  Procedure Laterality Date   CARDIAC CATHETERIZATION  January 2013   Proximal RCA 30-40%. Otherwise normal.   CARDIAC CATHETERIZATION N/A 10/18/2015   Procedure: Left Heart  Cath and Coronary Angiography;  Surgeon: Jettie Booze, MD;  Location: [place] [facility];  Service: Cardiovascular;  Laterality: N/A;   NM MYOVIEW LTD  March 2015   LOW RISK. No scar or ischemia. Normal EF (55%). No RWMA   surgical excision of rectal cancer      reports that he has been smoking cigarettes. He has been smoking an average of .25 packs per day. He has never used smokeless tobacco. He reports current alcohol use. He reports that he does not use drugs. family history includes Cancer in his mother; Hypertension in his mother; Liver disease in his father. No Known Allergies Current Outpatient Medications on File Prior to Visit  Medication Sig Dispense Refill   acetaminophen (TYLENOL) 500 MG tablet Take 500 mg by mouth every 6 (six) hours as needed.     amLODipine (NORVASC) 5 MG tablet Take 5 mg by mouth daily.     aspirin EC 81 MG tablet Take 1 tablet (81 mg total) by mouth daily.     atorvastatin (LIPITOR) 20 MG tablet Take 1 tablet (20 mg total) by mouth daily. 90 tablet 3   donepezil (ARICEPT) 10 MG tablet Take 1 tablet (10 mg total) by mouth at bedtime. 90 tablet 4   hydrochlorothiazide (HYDRODIURIL) 25 MG tablet TAKE 1 TABLET(25 MG) BY  MOUTH DAILY 90 tablet 3   losartan (COZAAR) 100 MG tablet Take 1 tablet (100 mg total) by mouth daily. 90 tablet 3   meloxicam (MOBIC) 15 MG tablet Take 1 tablet (15 mg total) by mouth daily as needed for pain. 90 tablet 1   memantine (NAMENDA) 10 MG tablet Take 1 tablet (10 mg total) by mouth 2 (two) times daily. 60 tablet 11   omeprazole (PRILOSEC) 40 MG capsule Take 1 capsule (40 mg total) by mouth daily. 30 capsule 5   oxycodone (OXY-IR) 5 MG capsule Take 1 capsule (5 mg total) by mouth every 4 (four) hours as needed. 10 capsule 0   sucralfate (CARAFATE) 1 g tablet Take 1 tablet (1 g total) by mouth 4 (four) times daily. 120 tablet 0   tizanidine (ZANAFLEX) 2 MG capsule Take 1 capsule (2 mg total) by mouth at bedtime as needed for muscle  spasms. 30 capsule 5   triamcinolone (NASACORT) 55 MCG/ACT AERO nasal inhaler Place 2 sprays into the nose daily. 1 Inhaler 12   vitamin B-12 (CYANOCOBALAMIN) 1000 MCG tablet Take 1 tablet (1,000 mcg total) by mouth daily. 90 tablet 3   Vitamin D, Ergocalciferol, (DRISDOL) 1.25 MG (50000 UNIT) CAPS capsule TAKE 1 CAPSULE BY MOUTH EVERY 7 DAYS 10 capsule 0   No current facility-administered medications on file prior to visit.        ROS:  All others reviewed and negative.  Objective        PE:  BP 138/66 (BP Location: Right Arm, Patient Position: Sitting, Cuff Size: Large)   Pulse (!) 56   Ht 5\' 11"  (1.803 m)   Wt 165 lb (74.8 kg)   SpO2 100%   BMI 23.01 kg/m                 Constitutional: Pt appears in NAD               HENT: Head: NCAT.                Right Ear: External ear normal.                 Left Ear: External ear normal.                Eyes: . Pupils are equal, round, and reactive to light. Conjunctivae and EOM are normal               Nose: without d/c or deformity               Neck: Neck supple. Gross normal ROM               Cardiovascular: Normal rate and regular rhythm.                 Pulmonary/Chest: Effort normal and breath sounds without rales or wheezing.                Abd:  Soft, mod tender RUQ, no  ND, + BS, no organomegaly               Neurological: Pt is alert. At baseline orientation, motor grossly intact               Skin: Skin is warm. No rashes, no other new lesions, LE edema - none               Psychiatric: Pt behavior is normal without agitation   Micro: none  Cardiac tracings  I have personally interpreted today:  none  Pertinent Radiological findings (summarize): none   Lab Results  Component Value Date   WBC 5.6 04/03/2021   HGB 16.0 04/03/2021   HCT 48.7 04/03/2021   PLT 202 04/03/2021   GLUCOSE 95 04/03/2021   CHOL 143 12/31/2020   TRIG 107.0 12/31/2020   HDL 37.90 (L) 12/31/2020   LDLCALC 84 12/31/2020   ALT 14 04/03/2021    AST 20 04/03/2021   NA 135 04/03/2021   K 3.4 (L) 04/03/2021   CL 98 04/03/2021   CREATININE 0.72 04/03/2021   BUN 11 04/03/2021   CO2 31 04/03/2021   TSH 1.07 12/31/2020   PSA 0.68 12/31/2020   INR 1.0 03/10/2021   HGBA1C 6.4 03/28/2021   MICROALBUR 1.2 12/31/2020   Assessment/Plan:  Andre [name] is a 80 y.o. Black or African American [2] male with  has a past medical history of Arthritis, Coronary artery disease, non-occlusive, GERD (gastroesophageal reflux disease), HA (headache), Hypertension, Memory loss, 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[place]), TIA (transient ischemic attack), and Weakness.  Epigastric pain To continue PPI  RUQ pain Persistent symptoms, u/s abnormal c/w acute cholecystitis, cbc recent normal, will cont pain control and refer GI as wel as General Surgury  Dementia ([place]) Stable, cont current aricept  Kidney mass New finding, c/w possible renal cell ca, for f/u PET scan and f/u urology as planned  Followup: Return in about 3 months (around 07/07/2021).  Cathlean Cower, MD 04/06/2021 10:03 PM [facility]

## 2021-04-06 NOTE — Assessment & Plan Note (Signed)
Stable, cont current aricept

## 2021-04-06 NOTE — Assessment & Plan Note (Signed)
New finding, c/w possible renal cell ca, for f/u PET scan and f/u urology as planned

## 2021-04-13 ENCOUNTER — Other Ambulatory Visit: Payer: Self-pay

## 2021-04-13 ENCOUNTER — Ambulatory Visit (HOSPITAL_COMMUNITY)
Admission: RE | Admit: 2021-04-13 | Discharge: 2021-04-13 | Disposition: A | Discharge status: Home / Self Care | Payer: Medicare Other | Source: Ambulatory Visit | Attending: Urology | Admitting: Urology

## 2021-04-13 ENCOUNTER — Other Ambulatory Visit (HOSPITAL_COMMUNITY): Payer: Self-pay | Admitting: Urology

## 2021-04-13 DIAGNOSIS — D49511 Neoplasm of unspecified behavior of right kidney: Secondary | ICD-10-CM

## 2021-04-13 DIAGNOSIS — Z01818 Encounter for other preprocedural examination: Secondary | ICD-10-CM | POA: Diagnosis not present

## 2021-04-13 DIAGNOSIS — I82220 Acute embolism and thrombosis of inferior vena cava: Secondary | ICD-10-CM | POA: Diagnosis not present

## 2021-04-22 ENCOUNTER — Telehealth: Payer: Self-pay

## 2021-04-22 NOTE — Progress Notes (Signed)
Chronic Care Management Pharmacy Assistant   Name: Andre [name]  MRN: [id number] DOB: [birth year]  Andre [name] is an 80 y.o. year old male who presents for his initial CCM visit with the clinical pharmacist.  Reason for Encounter: Initial Visit    Recent office visits:  04/06/21 Biagio Borg, MD-PCP (RUQ pain) Orders: referral to gastroenterology and general surgery, no med changes  03/28/21 Biagio Borg, MD-PCP (Epigastric pain) Orders: labs, med changes: sucralfate 1 g  12/31/20 Biagio Borg, MD-PCP (Encounter for well adult exam with abnormal findings) Orders : referral to podiatry and ophthalmology, cbc, bmp, Med changes: amlodipine 5 mg  Recent consult visits:  01/14/21 Gardiner Barefoot, DPM (Nail Problem) No orders or med changes  Hospital visits:  Medication Reconciliation was completed by comparing discharge summary, patient's EMR and Pharmacy list, and upon discussion with patient.  Admitted to the hospital on 04/03/21 due to abdominal pain. Discharge date was 04/04/21. Discharged from [facility] ED  New?Medications Started at [facility] Discharge:?? -started mylanta and lidocaine    Admitted to the hospital on 03/10/21 due to abdominal pain. Discharge date was 04/04/21. Discharged from [facility] ED  Admitted to the hospital on 11/10/20 due to chest pain. Discharge date was 04/04/21. Discharged from [facility] ED  Medications that remain the same after Hospital Discharge:??  -All other medications will remain the same.   Medications: Outpatient Encounter Medications as of 04/22/2021  Medication Sig   acetaminophen (TYLENOL) 500 MG tablet Take 500 mg by mouth every 6 (six) hours as needed.   amLODipine (NORVASC) 5 MG tablet Take 5 mg by mouth daily.   aspirin EC 81 MG tablet Take 1 tablet (81 mg total) by mouth daily.   atorvastatin (LIPITOR) 20 MG tablet Take 1 tablet (20 mg total) by mouth daily.   donepezil  (ARICEPT) 10 MG tablet Take 1 tablet (10 mg total) by mouth at bedtime.   hydrochlorothiazide (HYDRODIURIL) 25 MG tablet TAKE 1 TABLET(25 MG) BY MOUTH DAILY   losartan (COZAAR) 100 MG tablet Take 1 tablet (100 mg total) by mouth daily.   meloxicam (MOBIC) 15 MG tablet Take 1 tablet (15 mg total) by mouth daily as needed for pain.   memantine (NAMENDA) 10 MG tablet Take 1 tablet (10 mg total) by mouth 2 (two) times daily.   omeprazole (PRILOSEC) 40 MG capsule Take 1 capsule (40 mg total) by mouth daily.   oxycodone (OXY-IR) 5 MG capsule Take 1 capsule (5 mg total) by mouth every 4 (four) hours as needed.   sucralfate (CARAFATE) 1 g tablet Take 1 tablet (1 g total) by mouth 4 (four) times daily.   tizanidine (ZANAFLEX) 2 MG capsule Take 1 capsule (2 mg total) by mouth at bedtime as needed for muscle spasms.   triamcinolone (NASACORT) 55 MCG/ACT AERO nasal inhaler Place 2 sprays into the nose daily.   vitamin B-12 (CYANOCOBALAMIN) 1000 MCG tablet Take 1 tablet (1,000 mcg total) by mouth daily.   Vitamin D, Ergocalciferol, (DRISDOL) 1.25 MG (50000 UNIT) CAPS capsule TAKE 1 CAPSULE BY MOUTH EVERY 7 DAYS   No facility-administered encounter medications on file as of 04/22/2021.   Medication List: acetaminophen (TYLENOL) 500 MG tablet amLODipine (NORVASC) 5 MG tablet-last fill 03/02/21 90 ds aspirin EC 81 MG tablet atorvastatin (LIPITOR) 20 MG tablet-last fill 08/01/20 90 ds donepezil (ARICEPT) 10 MG tablet-last fill 12/17/19 90 ds hydrochlorothiazide (HYDRODIURIL) 25 MG tablet-last fill 02/27/21 90 ds losartan (COZAAR)  100 MG tablet-last fill 07/29/20 30 ds meloxicam (MOBIC) 15 MG tablet-last fill 02/11/20 last fill 90 ds memantine (NAMENDA) 10 MG tablet-last fill 12/17/19 30 ds omeprazole (PRILOSEC) 40 MG capsule-last fill 09/10/19 90 ds oxycodone (OXY-IR) 5 MG capsule-last fill 04/04/21 10 ds sucralfate (CARAFATE) 1 g tablet-last fill 04/02/21 30 ds tizanidine (ZANAFLEX) 2 MG capsule-last fill 30  ds triamcinolone (NASACORT) 55 MCG/ACT AERO nasal inhaler-last fill 10/07/19 vitamin B-12 (CYANOCOBALAMIN) 1000 MCG tablet Vitamin D, Ergocalciferol, (DRISDOL) 1.25 MG (50000 UNIT) CAPS capsule  Care Gaps: Colonoscopy-NA Diabetic Foot Exam-12/31/20 Ophthalmology-12/31/20 Dexa Scan - NA Annual Well Visit - NA Micro albumin-12/31/20 Hemoglobin A1c- 03/28/21  Star Rating Drugs: atorvastatin (LIPITOR) 20 MG tablet-last fill 08/01/20 90 ds losartan (COZAAR) 100 MG tablet-last fill 07/29/20 30 ds  [place] Pharmacist Assistant [phone]

## 2021-04-26 ENCOUNTER — Ambulatory Visit: Payer: Medicare Other

## 2021-04-26 NOTE — Progress Notes (Unsigned)
Chronic Care Management Pharmacy Note  04/26/2021 Name:  Andre [name] MRN:  [id number] DOB:  1941-01-30  Summary: ***  Recommendations/Changes made from today's visit: ***  Plan: ***  Subjective: Andre [name] is an 80 y.o. year old male who is a primary patient of Andre [name], Hunt Oris, MD.  The CCM team was consulted for assistance with disease management and care coordination needs.    Engaged with patient face to face for initial visit in response to provider referral for pharmacy case management and/or care coordination services.   Consent to Services:  The patient was given the following information about Chronic Care Management services today, agreed to services, and gave verbal consent: 1. CCM service includes personalized support from designated clinical staff supervised by the primary care provider, including individualized plan of care and coordination with other care providers 2. 24/7 contact phone numbers for assistance for urgent and routine care needs. 3. Service will only be billed when office clinical staff spend 20 minutes or more in a month to coordinate care. 4. Only one practitioner may furnish and bill the service in a calendar month. 5.The patient may stop CCM services at any time (effective at the end of the month) by phone call to the office staff. 6. The patient will be responsible for cost sharing (co-pay) of up to 20% of the service fee (after annual deductible is met). Patient agreed to services and consent obtained.  Patient Care Team: Biagio Borg, MD as PCP - General (Internal Medicine) [facility] as Consulting Physician (Ophthalmology) Marcial Pacas, MD as Consulting Physician (Neurology) Delice Bison Darnelle Maffucci, [facility] as Pharmacist (Pharmacist)  Recent office visits: 04/06/2021 - Dr. Jenny [name] - evaluation of right upper quadrant pain  - referred to gastro and general surgery - urology f/u 11/17 03/28/2021 - Dr. Jenny [name] - evaluation of epigastric pain - start  vitamin b12 1014mg daily  12/31/2020 - Dr. JJenny [name]- wellness exam - declines increase in atorvastatin - start vitamin b12 10046m daily   Recent consult visits: 01/14/2021 - Dr. [facility]ud[facility] - mechanical debridement  - f/u in 6 months   Hospital visits: 04/03/2021 - ED visit due to RLQ pain x 2 weeks  - GI cocktail given, right renal mass noted - to follow up with urology - noted gallstones as well - patient felt surgery was not necessary at this point  03/10/2021 - ED visit for abdominal pain - discharged same day - no changes to medications  11/10/2020 - ED visit for chest pain x 3 days - EKG reassuring, troponin negative - discharged no changes to medications   Objective:  Lab Results  Component Value Date   CREATININE 0.72 04/03/2021   BUN 11 04/03/2021   GFR 81.66 03/28/2021   GFRNONAA >60 04/03/2021   GFRAA >60 02/29/2020   NA 135 04/03/2021   K 3.4 (L) 04/03/2021   CALCIUM 9.4 04/03/2021   CO2 31 04/03/2021   GLUCOSE 95 04/03/2021    Lab Results  Component Value Date/Time   HGBA1C 6.4 03/28/2021 10:18 AM   HGBA1C 6.7 (H) 12/31/2020 09:38 AM   GFR 81.66 03/28/2021 10:18 AM   GFR 82.64 12/31/2020 09:38 AM   MICROALBUR 1.2 12/31/2020 09:38 AM   MICROALBUR 4.4 (H) 08/07/2019 11:25 AM    Last diabetic Eye exam:  No results found for: HMDIABEYEEXA  Last diabetic Foot exam:  No results found for: HMDIABFOOTEX   Lab Results  Component Value Date   CHOL 143 12/31/2020  HDL 37.90 (L) 12/31/2020   LDLCALC 84 12/31/2020   TRIG 107.0 12/31/2020   CHOLHDL 4 12/31/2020    Hepatic Function Latest Ref Rng & Units 04/03/2021 03/28/2021 03/10/2021  Total Protein 6.5 - 8.1 g/dL 8.0 7.3 7.4  Albumin 3.5 - 5.0 g/dL 4.3 4.1 3.9  AST 15 - 41 U/L 20 19 22   ALT 0 - 44 U/L 14 10 16   Alk Phosphatase 38 - 126 U/L 88 83 81  Total Bilirubin 0.3 - 1.2 mg/dL 1.2 1.2 1.5(H)  Bilirubin, Direct 0.0 - 0.3 mg/dL - 0.3 -    Lab Results  Component Value Date/Time   TSH 1.07 12/31/2020 09:38  AM   TSH 0.99 08/07/2019 11:18 AM   FREET4 0.72 10/17/2015 08:42 AM    CBC Latest Ref Rng & Units 04/03/2021 03/28/2021 03/10/2021  WBC 4.0 - 10.5 K/uL 5.6 4.9 5.9  Hemoglobin 13.0 - 17.0 g/dL 16.0 15.1 16.4  Hematocrit 39.0 - 52.0 % 48.7 46.7 50.3  Platelets 150 - 400 K/uL 202 235.0 223    Lab Results  Component Value Date/Time   VD25OH 23.34 (L) 12/31/2020 09:38 AM   VD25OH 8.0 (L) 06/24/2020 02:46 PM   VD25OH 8.6 (L) 12/17/2019 08:31 AM   VD25OH <7.00 (L) 08/07/2019 11:18 AM    Clinical ASCVD: Yes  The ASCVD Risk score (Arnett DK, et al., 2019) failed to calculate for the following reasons:   The patient has a prior MI or stroke diagnosis    Depression screen [facility] 2/9 12/31/2020 07/13/2020 09/30/2019  Decreased Interest 0 0 0  Down, Depressed, Hopeless 0 0 1  PHQ - 2 Score 0 0 1  Altered sleeping - - 0  Tired, decreased energy - - 1  Change in appetite - - 0  Feeling bad or failure about yourself  - - 0  Trouble concentrating - - 0  Moving slowly or fidgety/restless - - 0  PHQ-9 Score - - 2  Difficult doing work/chores - - Not difficult at all  Some recent data might be hidden     ***Other: (CHADS2VASc if Afib, MMRC or CAT for COPD, ACT, DEXA)  Social History   Tobacco Use  Smoking Status Some Days   Packs/day: 0.25   Types: Cigarettes  Smokeless Tobacco Never   BP Readings from Last 3 Encounters:  04/06/21 138/66  04/04/21 115/70  03/28/21 122/70   Pulse Readings from Last 3 Encounters:  04/06/21 (!) 56  04/04/21 67  03/28/21 77   Wt Readings from Last 3 Encounters:  04/06/21 165 lb (74.8 kg)  03/28/21 167 lb 12.8 oz (76.1 kg)  03/10/21 170 lb (77.1 kg)   BMI Readings from Last 3 Encounters:  04/06/21 23.01 kg/m  03/28/21 23.40 kg/m  03/10/21 23.71 kg/m    Assessment/Interventions: Review of patient past medical history, allergies, medications, health status, including review of consultants reports, laboratory and other test data, was performed  as part of comprehensive evaluation and provision of chronic care management services.   SDOH:  (Social Determinants of Health) assessments and interventions performed: {yes/no:20286}  SDOH Screenings   Alcohol Screen: Low Risk    Last Alcohol Screening Score (AUDIT): 3  Depression (PHQ2-9): Low Risk    PHQ-2 Score: 0  Financial Resource Strain: Low Risk    Difficulty of Paying Living Expenses: Not hard at all  Food Insecurity: No Food Insecurity   Worried About Charity fundraiser in the Last Year: Never true   Arboriculturist in  the Last Year: Never true  Housing: Low Risk    Last Housing Risk Score: 0  Physical Activity: Sufficiently Active   Days of Exercise per Week: 5 days   Minutes of Exercise per Session: 30 min  Social Connections: Moderately Integrated   Frequency of Communication with Friends and Family: More than three times a week   Frequency of Social Gatherings with Friends and Family: More than three times a week   Attends Religious Services: 1 to 4 times per year   Active Member of [employer] or Organizations: Yes   Attends Music therapist: More than 4 times per year   Marital Status: Separated  Stress: No Stress Concern Present   Feeling of Stress : Not at all  Tobacco Use: High Risk   Smoking Tobacco Use: Some Days   Smokeless Tobacco Use: Never   Passive Exposure: Not on file  Transportation Needs: No Transportation Needs   Lack of Transportation (Medical): No   Lack of Transportation (Non-Medical): No    CCM Care Plan  No Known Allergies  Medications Reviewed Today     Reviewed by Biagio Borg, MD (Physician) on 04/06/21 at 2203  Med List Status: <None>   Medication Order Taking? Sig Documenting Provider Last Dose Status Informant  acetaminophen (TYLENOL) 500 MG tablet [id number] Yes Take 500 mg by mouth every 6 (six) hours as needed. [provider] Taking Active   amLODipine (NORVASC) 5 MG tablet [id number] Yes Take 5 mg by mouth  daily. [provider] Taking Active   aspirin EC 81 MG tablet [id number] Yes Take 1 tablet (81 mg total) by mouth daily. Binnie Rail, MD Taking Active   atorvastatin (LIPITOR) 20 MG tablet [id number] Yes Take 1 tablet (20 mg total) by mouth daily. Biagio Borg, MD Taking Active   donepezil (ARICEPT) 10 MG tablet [id number] Yes Take 1 tablet (10 mg total) by mouth at bedtime. Marcial Pacas, MD Taking Active   hydrochlorothiazide (HYDRODIURIL) 25 MG tablet [id number] Yes TAKE 1 TABLET(25 MG) BY MOUTH DAILY Biagio Borg, MD Taking Active   losartan (COZAAR) 100 MG tablet [id number] Yes Take 1 tablet (100 mg total) by mouth daily. Biagio Borg, MD Taking Active   meloxicam [facility]) 15 MG tablet [id number] Yes Take 1 tablet (15 mg total) by mouth daily as needed for pain. Biagio Borg, MD Taking Active   memantine [facility]) 10 MG tablet [id number] Yes Take 1 tablet (10 mg total) by mouth 2 (two) times daily. Marcial Pacas, MD Taking Active   omeprazole (PRILOSEC) 40 MG capsule [id number] Yes Take 1 capsule (40 mg total) by mouth daily. Binnie Rail, MD Taking Active   oxycodone (OXY-IR) 5 MG capsule [id number] Yes Take 1 capsule (5 mg total) by mouth every 4 (four) hours as needed. Tacy Learn, PA-C Taking Active   sucralfate (CARAFATE) 1 g tablet [id number] Yes Take 1 tablet (1 g total) by mouth 4 (four) times daily. Biagio Borg, MD Taking Active   tizanidine (ZANAFLEX) 2 MG capsule [id number] Yes Take 1 capsule (2 mg total) by mouth at bedtime as needed for muscle spasms. Biagio Borg, MD Taking Active   triamcinolone (NASACORT) 55 MCG/ACT AERO nasal inhaler [id number] Yes Place 2 sprays into the nose daily. Biagio Borg, MD Taking Active   vitamin B-12 (CYANOCOBALAMIN) 1000 MCG tablet [id number] Yes Take 1 tablet (1,000 mcg total) by mouth daily. Biagio Borg, MD Taking Active   Vitamin D,  Ergocalciferol, (DRISDOL) 1.25 MG (50000 UNIT) CAPS capsule [id number] Yes TAKE 1 CAPSULE BY MOUTH EVERY 7  DAYS Suzzanne Cloud, NP Taking Active             Patient Active Problem List   Diagnosis Date Noted   RUQ pain 04/06/2021   Kidney mass 04/06/2021   Epigastric pain 03/28/2021   Pain due to onychomycosis of toenails of both feet 01/14/2021   Blurry vision, bilateral 12/31/2020   Nail disorder 12/31/2020   Left shoulder pain 02/11/2020   Right shoulder pain 02/11/2020   Vitamin B12 deficiency 12/17/2019   Mild cognitive impairment 12/17/2019   Peripheral neuropathy 12/17/2019   Vitamin D deficiency 12/01/2019   Patellar tendonitis of left knee 12/01/2019   Allergic rhinitis 10/07/2019   Dementia ([place]) 10/07/2019   CAD (coronary artery disease) 09/30/2019   Amnesia 09/10/2019   Right-sided nosebleed 08/07/2019   Encounter for well adult exam with abnormal findings 08/07/2019   Leg cramping 08/07/2019   Chest pain 03/23/2019   Epistaxis 03/23/2019   Hyperlipidemia 12/24/2017   Alcohol use 12/24/2017   Non-compliance 12/24/2017   Degenerative disc disease, lumbar 11/15/2017   Left lumbar radiculopathy 08/02/2017   Atherosclerosis 06/03/2017   Low-level of literacy 05/12/2017   Osteoarthritis 04/20/2017   Diabetes ([place]) 03/12/2017   Back pain 02/29/2016   Abnormal stress test 10/18/2015   Erectile dysfunction 02/25/2015   Cigarette smoker 02/10/2015   COPD GOLD 0 11/08/2014   Hemorrhoids 02/11/2014   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   Headache 10/23/2011   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[place]) 10/23/2011   GERD (gastroesophageal reflux disease) 10/23/2011   Nicotine addiction 10/23/2011   Alcohol abuse, 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     Medication Assistance: {MEDASSISTANCEINFO:25044}  Compliance/Adherence/Medication fill history: Care Gaps: ***  Patient's preferred pharmacy is:  [facility] [phone] - [address] [place], [place] [address] Phone: [phone] Fax: [phone]   Uses pill box? {Yes or If no, why not?:20788} Pt endorses ***% compliance  Care Plan and Follow Up Patient Decision:  {FOLLOWUP:24991}  Plan: {CM FOLLOW UP BTDH:74163} ***  Current Barriers:  {pharmacybarriers:24917}  Pharmacist Clinical Goal(s):  Patient will {PHARMACYGOALCHOICES:24921} through collaboration with PharmD and provider.   Interventions: 1:1 collaboration with Biagio Borg, MD regarding development and update of comprehensive plan of care as evidenced by provider attestation and co-signature Inter-disciplinary care team collaboration (see longitudinal plan of care) Comprehensive medication review performed; medication list updated in electronic medical record  Hypertension (BP goal <140/90) -{US controlled/uncontrolled:25276} -Current treatment: Amlodipine 33m - 1 tablet daily  Hydrochlorothiazide 294m- 1 tablet daily  Losartan 10082m 1 tablet daily  -Medications previously tried: atenolol, chlorthalidone, lisinopril, metoprolol, propranolol, valsartan,   -Current home readings: *** -Current dietary habits: *** -Current exercise habits: *** -{ACTIONS;DENIES/REPORTS:21021675::"Denies"} hypotensive/hypertensive symptoms -Educated on {CCM BP Counseling:25124} -Counseled to monitor BP at home ***, document, and provide log at future appointments -{CCMPHARMDINTERVENTION:25122}  Hyperlipidemia/ Coronary Artery Disease / History of TIA: (LDL goal < 70) -Not  ideally controlled Lab  Results  Component Value Date   LDLCALC 84 12/31/2020  -Current treatment: Atorvastatin 89m - 1 tablet daily  Aspirin 81m- 1 tablet daily  -Medications previously tried: n/a  -Current dietary patterns: *** -Current exercise habits: *** -Educated on {CCM HLD Counseling:25126} -{CCMPHARMDINTERVENTION:25122}  Diabetes (A1c goal <7%) -Controlled Lab Results  Component Value Date   HGBA1C 6.7 (H) 12/31/2020  -Current medications: N/a -Medications previously tried: glipizide, metformin  -Current home glucose readings fasting glucose: *** post prandial glucose: *** -{ACTIONS;DENIES/REPORTS:21021675::"Denies"} hypoglycemic/hyperglycemic symptoms -Current meal patterns:  breakfast: ***  lunch: ***  dinner: *** snacks: *** drinks: *** -Current exercise: *** -Educated on {CCM DM COUNSELING:25123} -Counseled to check feet daily and get yearly eye exams -{CCMPHARMDINTERVENTION:25122}  Tobacco use (Goal: Smoking Cessation) -{US controlled/uncontrolled:25276} -Previous quit attempts: *** -Current treatment  *** -Patient smokes {Time to first cigarette:23873} -Patient triggers include: {Smoking Triggers:23882} -On a scale of 1-10, reports MOTIVATION to quit is *** -On a scale of 1-10, reports CONFIDENCE in quitting is *** -{Smoking Cessation Counseling:23883} -{CCMPHARMDINTERVENTION:25122}  Dementia (Goal: Prevention of disease progression/ memory loss ) -{US controlled/uncontrolled:25276} -Current treatment  Memantine 1065m 1 tablet daily  Donepezil 22m29m1 tablet daily  -Medications previously tried: ***  -{CCMPHARMDINTERVENTION:25122}  Osteoarthritis/ Chronic pain/ Radiculopathy (Goal: Pain control) -{US controlled/uncontrolled:25276} -Current treatment  Meloxicam 15mg2mtablet daily  Acetaminophen 500mg 34mtablet every 6 hours as needed  Tizanidine 2mg - 42mapsule at bedtime as needed  -Medications previously tried: norco, ibuprofen, indomethacin, naproxen,  oxaprozin, percocet, tramadol  -{CCMPHARMDINTERVENTION:25122}  GERD (Goal: Acid reflux control / prevention) -{US controlled/uncontrolled:25276} -Current treatment  Omeprazole 40mg - 75mpsule daily Sucralfate 1g - 1 tablet 4 times daily  -Medications previously tried: pantoprazole, sucralfate, lansoprazole, famotidine,dexlansoprazole, pepto bismol  -{CCMPHARMDINTERVENTION:25122}  Allergic Rhinitis (Goal: Prevention / control of allergy symptoms) -{US controlled/uncontrolled:25276} -Current treatment  Triamcinolone 55mcg/ac78m2 sprays into each nostril daily -Medications previously tried: ***  -{CCMPHARMDINTERVENTION:25122}  Health Maintenance -Vaccine gaps: *** -Current therapy:  Vitamin D 50,000 units - 1 capsule weekly Last vitamin D Lab Results  Component Value Date   VD25OH 23.34 (L) 12/31/2020  Vitamin B12 1000mcg - 152mlet daily  Lab Results  Component Value Date   VITAMINB12 211 12/31/2020  -Educated on {ccm supplement counseling:25128} -{CCM Patient satisfied:25129} -{CCMPHARMDINTERVENTION:25122}  Patient Goals/Self-Care Activities Patient will:  - {pharmacypatientgoals:24919}  Follow Up Plan: {CM FOLLOW UP PLAN:22241}  Chart prep = 30 minutes

## 2021-04-28 DIAGNOSIS — R599 Enlarged lymph nodes, unspecified: Secondary | ICD-10-CM | POA: Diagnosis not present

## 2021-04-28 DIAGNOSIS — D49511 Neoplasm of unspecified behavior of right kidney: Secondary | ICD-10-CM | POA: Diagnosis not present

## 2021-05-03 DIAGNOSIS — R1011 Right upper quadrant pain: Secondary | ICD-10-CM | POA: Diagnosis not present

## 2021-05-11 ENCOUNTER — Other Ambulatory Visit: Payer: Self-pay | Admitting: Internal Medicine

## 2021-05-12 ENCOUNTER — Emergency Department (HOSPITAL_COMMUNITY): Payer: Medicare Other

## 2021-05-12 ENCOUNTER — Encounter (HOSPITAL_COMMUNITY): Payer: Self-pay | Admitting: Emergency Medicine

## 2021-05-12 ENCOUNTER — Inpatient Hospital Stay (HOSPITAL_COMMUNITY)
Admission: EM | Admit: 2021-05-12 | Discharge: 2021-05-21 | DRG: 871 | Disposition: A | Discharge status: Home / Self Care | Payer: Medicare Other | Attending: Internal Medicine | Admitting: Internal Medicine

## 2021-05-12 DIAGNOSIS — K810 Acute cholecystitis: Secondary | ICD-10-CM | POA: Diagnosis present

## 2021-05-12 DIAGNOSIS — R6520 Severe sepsis without septic shock: Secondary | ICD-10-CM | POA: Diagnosis not present

## 2021-05-12 DIAGNOSIS — Z8249 Family history of ischemic heart disease and other diseases of the circulatory system: Secondary | ICD-10-CM | POA: Diagnosis not present

## 2021-05-12 DIAGNOSIS — C253 Malignant neoplasm of pancreatic duct: Secondary | ICD-10-CM | POA: Diagnosis not present

## 2021-05-12 DIAGNOSIS — I10 Essential (primary) hypertension: Secondary | ICD-10-CM | POA: Diagnosis not present

## 2021-05-12 DIAGNOSIS — Z85048 Personal history of other malignant neoplasm of rectum, rectosigmoid junction, and anus: Secondary | ICD-10-CM | POA: Diagnosis not present

## 2021-05-12 DIAGNOSIS — E876 Hypokalemia: Secondary | ICD-10-CM | POA: Diagnosis not present

## 2021-05-12 DIAGNOSIS — Z95828 Presence of other vascular implants and grafts: Secondary | ICD-10-CM

## 2021-05-12 DIAGNOSIS — I429 Cardiomyopathy, unspecified: Secondary | ICD-10-CM | POA: Diagnosis present

## 2021-05-12 DIAGNOSIS — R109 Unspecified abdominal pain: Secondary | ICD-10-CM | POA: Diagnosis not present

## 2021-05-12 DIAGNOSIS — F17210 Nicotine dependence, cigarettes, uncomplicated: Secondary | ICD-10-CM | POA: Diagnosis not present

## 2021-05-12 DIAGNOSIS — I110 Hypertensive heart disease with heart failure: Secondary | ICD-10-CM | POA: Diagnosis not present

## 2021-05-12 DIAGNOSIS — I2510 Atherosclerotic heart disease of native coronary artery without angina pectoris: Secondary | ICD-10-CM | POA: Diagnosis not present

## 2021-05-12 DIAGNOSIS — Z743 Need for continuous supervision: Secondary | ICD-10-CM | POA: Diagnosis not present

## 2021-05-12 DIAGNOSIS — K750 Abscess of liver: Secondary | ICD-10-CM | POA: Diagnosis present

## 2021-05-12 DIAGNOSIS — Z7982 Long term (current) use of aspirin: Secondary | ICD-10-CM | POA: Diagnosis not present

## 2021-05-12 DIAGNOSIS — A4151 Sepsis due to Escherichia coli [E. coli]: Principal | ICD-10-CM | POA: Diagnosis present

## 2021-05-12 DIAGNOSIS — Z79899 Other long term (current) drug therapy: Secondary | ICD-10-CM | POA: Diagnosis not present

## 2021-05-12 DIAGNOSIS — E785 Hyperlipidemia, unspecified: Secondary | ICD-10-CM | POA: Diagnosis not present

## 2021-05-12 DIAGNOSIS — R7881 Bacteremia: Secondary | ICD-10-CM

## 2021-05-12 DIAGNOSIS — Z791 Long term (current) use of non-steroidal anti-inflammatories (NSAID): Secondary | ICD-10-CM | POA: Diagnosis not present

## 2021-05-12 DIAGNOSIS — I2693 Single subsegmental pulmonary embolism without acute cor pulmonale: Secondary | ICD-10-CM | POA: Diagnosis present

## 2021-05-12 DIAGNOSIS — K838 Other specified diseases of biliary tract: Secondary | ICD-10-CM | POA: Diagnosis not present

## 2021-05-12 DIAGNOSIS — Z20822 Contact with and (suspected) exposure to covid-19: Secondary | ICD-10-CM | POA: Diagnosis present

## 2021-05-12 DIAGNOSIS — I2699 Other pulmonary embolism without acute cor pulmonale: Secondary | ICD-10-CM | POA: Diagnosis present

## 2021-05-12 DIAGNOSIS — K3189 Other diseases of stomach and duodenum: Secondary | ICD-10-CM | POA: Diagnosis not present

## 2021-05-12 DIAGNOSIS — E871 Hypo-osmolality and hyponatremia: Secondary | ICD-10-CM | POA: Diagnosis not present

## 2021-05-12 DIAGNOSIS — K8020 Calculus of gallbladder without cholecystitis without obstruction: Secondary | ICD-10-CM | POA: Diagnosis not present

## 2021-05-12 DIAGNOSIS — R6889 Other general symptoms and signs: Secondary | ICD-10-CM | POA: Diagnosis not present

## 2021-05-12 DIAGNOSIS — R000 Tachycardia, unspecified: Secondary | ICD-10-CM | POA: Diagnosis not present

## 2021-05-12 DIAGNOSIS — A419 Sepsis, unspecified organism: Secondary | ICD-10-CM | POA: Diagnosis present

## 2021-05-12 DIAGNOSIS — F0390 Unspecified dementia without behavioral disturbance: Secondary | ICD-10-CM | POA: Diagnosis present

## 2021-05-12 DIAGNOSIS — B9620 Unspecified Escherichia coli [E. coli] as the cause of diseases classified elsewhere: Secondary | ICD-10-CM | POA: Diagnosis not present

## 2021-05-12 DIAGNOSIS — I5022 Chronic systolic (congestive) heart failure: Secondary | ICD-10-CM | POA: Diagnosis not present

## 2021-05-12 DIAGNOSIS — R1011 Right upper quadrant pain: Secondary | ICD-10-CM | POA: Diagnosis present

## 2021-05-12 DIAGNOSIS — I517 Cardiomegaly: Secondary | ICD-10-CM | POA: Diagnosis not present

## 2021-05-12 DIAGNOSIS — R599 Enlarged lymph nodes, unspecified: Secondary | ICD-10-CM | POA: Diagnosis not present

## 2021-05-12 DIAGNOSIS — N2889 Other specified disorders of kidney and ureter: Secondary | ICD-10-CM | POA: Diagnosis not present

## 2021-05-12 DIAGNOSIS — K2100 Gastro-esophageal reflux disease with esophagitis, without bleeding: Secondary | ICD-10-CM | POA: Diagnosis not present

## 2021-05-12 DIAGNOSIS — I499 Cardiac arrhythmia, unspecified: Secondary | ICD-10-CM | POA: Diagnosis not present

## 2021-05-12 DIAGNOSIS — Z8673 Personal history of transient ischemic attack (TIA), and cerebral infarction without residual deficits: Secondary | ICD-10-CM | POA: Diagnosis not present

## 2021-05-12 DIAGNOSIS — K6389 Other specified diseases of intestine: Secondary | ICD-10-CM | POA: Diagnosis not present

## 2021-05-12 DIAGNOSIS — Z0389 Encounter for observation for other suspected diseases and conditions ruled out: Secondary | ICD-10-CM | POA: Diagnosis not present

## 2021-05-12 DIAGNOSIS — R112 Nausea with vomiting, unspecified: Secondary | ICD-10-CM | POA: Diagnosis not present

## 2021-05-12 DIAGNOSIS — J9811 Atelectasis: Secondary | ICD-10-CM | POA: Diagnosis not present

## 2021-05-12 DIAGNOSIS — K219 Gastro-esophageal reflux disease without esophagitis: Secondary | ICD-10-CM | POA: Diagnosis not present

## 2021-05-12 DIAGNOSIS — C775 Secondary and unspecified malignant neoplasm of intrapelvic lymph nodes: Secondary | ICD-10-CM | POA: Diagnosis not present

## 2021-05-12 DIAGNOSIS — R911 Solitary pulmonary nodule: Secondary | ICD-10-CM | POA: Diagnosis present

## 2021-05-12 DIAGNOSIS — R1084 Generalized abdominal pain: Secondary | ICD-10-CM | POA: Diagnosis not present

## 2021-05-12 DIAGNOSIS — R531 Weakness: Secondary | ICD-10-CM | POA: Diagnosis not present

## 2021-05-12 DIAGNOSIS — R933 Abnormal findings on diagnostic imaging of other parts of digestive tract: Secondary | ICD-10-CM | POA: Diagnosis not present

## 2021-05-12 DIAGNOSIS — R1010 Upper abdominal pain, unspecified: Secondary | ICD-10-CM | POA: Diagnosis not present

## 2021-05-12 DIAGNOSIS — R509 Fever, unspecified: Secondary | ICD-10-CM

## 2021-05-12 DIAGNOSIS — R591 Generalized enlarged lymph nodes: Secondary | ICD-10-CM | POA: Diagnosis not present

## 2021-05-12 DIAGNOSIS — R0902 Hypoxemia: Secondary | ICD-10-CM | POA: Diagnosis not present

## 2021-05-12 LAB — CBC WITH DIFFERENTIAL/PLATELET
Abs Immature Granulocytes: 0.02 10*3/uL (ref 0.00–0.07)
Basophils Absolute: 0 10*3/uL (ref 0.0–0.1)
Basophils Relative: 0 %
Eosinophils Absolute: 0.1 10*3/uL (ref 0.0–0.5)
Eosinophils Relative: 1 %
HCT: 51.5 % (ref 39.0–52.0)
Hemoglobin: 16.7 g/dL (ref 13.0–17.0)
Immature Granulocytes: 0 %
Lymphocytes Relative: 13 %
Lymphs Abs: 1.1 10*3/uL (ref 0.7–4.0)
MCH: 29.8 pg (ref 26.0–34.0)
MCHC: 32.4 g/dL (ref 30.0–36.0)
MCV: 92 fL (ref 80.0–100.0)
Monocytes Absolute: 0.1 10*3/uL (ref 0.1–1.0)
Monocytes Relative: 1 %
Neutro Abs: 7.6 10*3/uL (ref 1.7–7.7)
Neutrophils Relative %: 85 %
Platelets: 225 10*3/uL (ref 150–400)
RBC: 5.6 MIL/uL (ref 4.22–5.81)
RDW: 15.1 % (ref 11.5–15.5)
WBC: 8.9 10*3/uL (ref 4.0–10.5)
nRBC: 0 % (ref 0.0–0.2)

## 2021-05-12 LAB — COMPREHENSIVE METABOLIC PANEL
ALT: 20 U/L (ref 0–44)
AST: 26 U/L (ref 15–41)
Albumin: 4.1 g/dL (ref 3.5–5.0)
Alkaline Phosphatase: 116 U/L (ref 38–126)
Anion gap: 10 (ref 5–15)
BUN: 10 mg/dL (ref 8–23)
CO2: 30 mmol/L (ref 22–32)
Calcium: 10.1 mg/dL (ref 8.9–10.3)
Chloride: 96 mmol/L — ABNORMAL LOW (ref 98–111)
Creatinine, Ser: 0.94 mg/dL (ref 0.61–1.24)
GFR, Estimated: >60 mL/min (ref >60)
Glucose, Bld: 111 mg/dL — ABNORMAL HIGH (ref 70–99)
Potassium: 4.3 mmol/L (ref 3.5–5.1)
Sodium: 136 mmol/L (ref 135–145)
Total Bilirubin: 3.5 mg/dL — ABNORMAL HIGH (ref 0.3–1.2)
Total Protein: 8.1 g/dL (ref 6.5–8.1)

## 2021-05-12 LAB — PROTIME-INR
INR: 1.1 (ref 0.8–1.2)
Prothrombin Time: 14.7 seconds (ref 11.4–15.2)

## 2021-05-12 LAB — URINALYSIS, ROUTINE W REFLEX MICROSCOPIC
Glucose, UA: NEGATIVE mg/dL
Hgb urine dipstick: NEGATIVE
Ketones, ur: 15 mg/dL — AB
Leukocytes,Ua: NEGATIVE
Nitrite: NEGATIVE
Protein, ur: NEGATIVE mg/dL
Specific Gravity, Urine: 1.02 (ref 1.005–1.030)
pH: 6 (ref 5.0–8.0)

## 2021-05-12 LAB — RESP PANEL BY RT-PCR (FLU A&B, COVID) ARPGX2
Influenza A by PCR: NEGATIVE
Influenza B by PCR: NEGATIVE
SARS Coronavirus 2 by RT PCR: NEGATIVE

## 2021-05-12 LAB — APTT: aPTT: 31 seconds (ref 24–36)

## 2021-05-12 LAB — LACTIC ACID, PLASMA: Lactic Acid, Venous: 1.9 mmol/L (ref 0.5–1.9)

## 2021-05-12 LAB — TROPONIN I (HIGH SENSITIVITY): Troponin I (High Sensitivity): 11 ng/L (ref <18)

## 2021-05-12 MED ORDER — IOHEXOL 350 MG/ML SOLN
100.0000 mL | Freq: Once | INTRAVENOUS | Status: AC | PRN
  Administered 2021-05-12: 100 mL via INTRAVENOUS

## 2021-05-12 MED ORDER — LACTATED RINGERS IV BOLUS (SEPSIS)
250.0000 mL | Freq: Once | INTRAVENOUS | Status: AC
  Administered 2021-05-12: 250 mL via INTRAVENOUS

## 2021-05-12 MED ORDER — LACTATED RINGERS IV BOLUS (SEPSIS)
1000.0000 mL | Freq: Once | INTRAVENOUS | Status: AC
  Administered 2021-05-12: 1000 mL via INTRAVENOUS

## 2021-05-12 MED ORDER — METRONIDAZOLE 500 MG/100ML IV SOLN: 500.0000 mg | Freq: Once | INTRAVENOUS | Status: DC

## 2021-05-12 MED ORDER — NOREPINEPHRINE 4 MG/250ML-% IV SOLN: 0.0000 ug/min | INTRAVENOUS | Status: DC

## 2021-05-12 MED ORDER — SODIUM CHLORIDE 0.9 % IV SOLN: 2.0000 g | Freq: Once | INTRAVENOUS | Status: DC

## 2021-05-12 MED ORDER — HYDROMORPHONE HCL 1 MG/ML IJ SOLN
1.0000 mg | Freq: Once | INTRAMUSCULAR | Status: AC
  Administered 2021-05-12: 1 mg via INTRAVENOUS
  Filled 2021-05-12: qty 1

## 2021-05-12 MED ORDER — PIPERACILLIN-TAZOBACTAM 3.375 G IVPB 30 MIN
3.3750 g | Freq: Once | INTRAVENOUS | Status: AC
  Administered 2021-05-12: 3.375 g via INTRAVENOUS
  Filled 2021-05-12: qty 50

## 2021-05-12 MED ORDER — VANCOMYCIN HCL 1500 MG/300ML IV SOLN
1500.0000 mg | Freq: Once | INTRAVENOUS | Status: DC
  Filled 2021-05-12: qty 300

## 2021-05-12 MED ORDER — LACTATED RINGERS IV SOLN: INTRAVENOUS | Status: DC

## 2021-05-12 NOTE — ED Triage Notes (Signed)
Patient BIB [facility] from [facility] with complaint of sudden onset of tremor that started at approximately 1745 and stopped suddenly prior to arriving to ED. Patient alert, oriented, and his only complaint is that he feels cold.

## 2021-05-12 NOTE — ED Provider Notes (Addendum)
Emergency Medicine Provider Triage Evaluation Note  Andre [name] , a 80 y.o. male  was evaluated in triage.  Pt complains of intention TREMOR, that began at 545 and stop if he arrived in the ED.  He does not have any complaints however continues to voice that he feels cold.  Review of Systems  Positive: chills Negative: Fever, chest pain, shortness of breath  Physical Exam  BP 105/73 (BP Location: Left Arm)   Pulse (!) 56   Temp 99.3 F (37.4 C)   Resp 16   SpO2 100%  Gen:   Awake, no distress   Resp:  Normal effort  MSK:   Moves extremities without difficulty  Other:   Medical Decision Making  Medically screening exam initiated at 7:29 PM.  Appropriate orders placed.  Andre [name] was informed that the remainder of the evaluation will be completed by another provider, this initial triage assessment does not replace that evaluation, and the importance of remaining in the ED until their evaluation is complete.    8:26 PM Patient's family concerned for patients symptoms.  Patient appears to be very disoriented, is very slow to respond.  Unable to obtain pressure readings, they were soft when these were taken and the waiting room.  Patient needs a room ASAP.    Janeece Fitting, PA-C 05/12/21 1931    Janeece Fitting, PA-C 05/12/21 2033    Daleen Bo, MD 05/12/21 [phone]

## 2021-05-12 NOTE — ED Provider Notes (Signed)
[facility] EMERGENCY DEPARTMENT Provider Note   CSN: [id number] Arrival date & time: 05/12/21  1824     History Chief Complaint  Patient presents with   Tremors   Code Sepsis    Andre [name] is a 80 y.o. male.  HPI  80 year old male with a history of CAD, nonocclusive, GERD, hypertension, dementia, TIA, diagnosis of gallstone disease with a planned elective cholecystectomy for late December, presenting to the emergency department with concern for chills, right upper quadrant abdominal pain, elevated heart rate at home.  Right upper quadrant pain is sharp, shooting, nonradiating, worsened with movement, better with rest. History is provided by the patient's daughter as the patient was unable to provide more medical history.  He has had roughly 1 day of chills and worsening right upper quadrant abdominal pain.  He had previously presented to the emergency department October for abdominal pain and had ulcer of the abdomen that was concerning for possible cholecystitis but however it did not have a leukocytosis at that time, normal biliary labs, surgery had planned for outpatient management.    Past Medical History:  Diagnosis Date   Arthritis    Coronary artery disease, non-occlusive    a. 06/2011: cath for abnormal exercise echo: Only 30-40% proximal RCA. Otherwise normal. b. 10/2015: cath for abnormal NST and atypical CP --> showed mild 30-40% stenosis in the prox-distal RCA   GERD (gastroesophageal reflux disease)    HA (headache)    Hypertension    Memory loss    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[place])    TIA (transient ischemic attack)    Weakness     Patient Active Problem List   Diagnosis Date Noted   Sepsis ([place]) 05/13/2021   RUQ pain 04/06/2021   Kidney mass 04/06/2021   Epigastric pain 03/28/2021   Pain due to onychomycosis of toenails of both feet 01/14/2021   Blurry vision, bilateral 12/31/2020   Nail disorder 12/31/2020   Left shoulder pain 02/11/2020   Right  shoulder pain 02/11/2020   Vitamin B12 deficiency 12/17/2019   Mild cognitive impairment 12/17/2019   Peripheral neuropathy 12/17/2019   Vitamin D deficiency 12/01/2019   Patellar tendonitis of left knee 12/01/2019   Allergic rhinitis 10/07/2019   Dementia ([place]) 10/07/2019   CAD (coronary artery disease) 09/30/2019   Amnesia 09/10/2019   Right-sided nosebleed 08/07/2019   Encounter for well adult exam with abnormal findings 08/07/2019   Leg cramping 08/07/2019   Chest pain 03/23/2019   Epistaxis 03/23/2019   Hyperlipidemia 12/24/2017   Alcohol use 12/24/2017   Non-compliance 12/24/2017   Degenerative disc disease, lumbar 11/15/2017   Left lumbar radiculopathy 08/02/2017   Atherosclerosis 06/03/2017   Low-level of literacy 05/12/2017   Osteoarthritis 04/20/2017   Diabetes ([place]) 03/12/2017   Back pain 02/29/2016   Abnormal stress test 10/18/2015   Erectile dysfunction 02/25/2015   Cigarette smoker 02/10/2015   COPD GOLD 0 11/08/2014   Hemorrhoids 02/11/2014   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 Otherwise normal.   CARDIAC CATHETERIZATION N/A 10/18/2015   Procedure: Left Heart Cath and Coronary Angiography;  Surgeon: Jettie Booze, MD;  Location:  [facility]IVE [facility];  Service: Cardiovascular;  Laterality: N/A;   NM MYOVIEW LTD  March 2015   LOW RISK. No scar or ischemia. Normal EF (55%). No RWMA   surgical excision of rectal cancer         Family History  Problem Relation Age of Onset   Hypertension Mother    Cancer Mother    Liver disease Father     Social History   Tobacco  Use   Smoking status: Some Days    Packs/day: 0.25    Types: Cigarettes   Smokeless tobacco: Never  Vaping Use   Vaping Use: Never used  Substance Use Topics   Alcohol use: Yes    Comment: drinks "every now and then"   Drug use: No    Home Medications Prior to Admission medications   Medication Sig Start Date End Date Taking? Authorizing Provider  acetaminophen (TYLENOL) 500 MG tablet Take 500 mg by mouth every 6 (six) hours as needed.    [provider]  amLODipine (NORVASC) 5 MG tablet Take 5 mg by mouth daily. 08/27/20   [provider]  aspirin EC 81 MG tablet Take 1 tablet (81 mg total) by mouth daily. 03/24/19   Binnie Rail, MD  atorvastatin (LIPITOR) 20 MG tablet Take 1 tablet (20 mg total) by mouth daily. 08/07/19   Biagio Borg, MD  donepezil (ARICEPT) 10 MG tablet Take 1 tablet (10 mg total) by mouth at bedtime. 12/17/19   Marcial Pacas, MD  hydrochlorothiazide (HYDRODIURIL) 25 MG tablet TAKE 1 TABLET(25 MG) BY MOUTH DAILY 08/28/20   Biagio Borg, MD  losartan (COZAAR) 100 MG tablet Take 1 tablet (100 mg total) by mouth daily. 08/07/19   Biagio Borg, MD  meloxicam (MOBIC) 15 MG tablet Take 1 tablet (15 mg total) by mouth daily as needed for pain. 02/11/20   Biagio Borg, MD  memantine (NAMENDA) 10 MG tablet Take 1 tablet (10 mg total) by mouth 2 (two) times daily. 12/17/19   Marcial Pacas, MD  omeprazole (PRILOSEC) 40 MG capsule Take 1 capsule (40 mg total) by mouth daily. 09/10/19   Binnie Rail, MD  oxycodone (OXY-IR) 5 MG capsule Take 1 capsule (5 mg total) by mouth every 4 (four) hours as needed. 04/04/21   Tacy Learn, PA-C  sucralfate (CARAFATE) 1 g tablet Take 1 tablet (1 g total) by mouth 4 (four) times daily. 03/28/21   Biagio Borg, MD  tizanidine (ZANAFLEX) 2 MG capsule Take 1 capsule (2 mg total) by mouth at bedtime as needed for muscle spasms. 08/07/19   Biagio Borg, MD  triamcinolone (NASACORT) 55 MCG/ACT AERO nasal inhaler Place 2 sprays into the  nose daily. 10/07/19   Biagio Borg, MD  vitamin B-12 (CYANOCOBALAMIN) 1000 MCG tablet Take 1 tablet (1,000 mcg total) by mouth daily. 12/01/19   Biagio Borg, MD  Vitamin D, Ergocalciferol, (DRISDOL) 1.25 MG (50000 UNIT) CAPS capsule TAKE 1 CAPSULE BY MOUTH EVERY 7 DAYS 12/15/20   Suzzanne Cloud, NP    Allergies    Patient has no known allergies.  Review of Systems   Review of Systems  Constitutional:  Positive for chills.  HENT:  Negative for ear pain and sore throat.   Eyes:  Negative for pain and visual disturbance.  Respiratory:  Negative for cough and shortness of breath.   Cardiovascular:  Negative for chest pain and palpitations.  Gastrointestinal:  Positive for abdominal pain.  Genitourinary:  Negative for dysuria and hematuria.  Musculoskeletal:  Negative for arthralgias and back pain.  Skin:  Negative for color change and rash.  Neurological:  Negative for seizures and syncope.  All other systems reviewed and are negative.  Physical Exam Updated Vital Signs BP 113/74   Pulse 69   Temp 99.2 F (37.3 C) (Oral)   Resp 14   SpO2 97%   Physical Exam Vitals and nursing note reviewed.  Constitutional:      General: He is not in acute distress.    Appearance: He is well-developed.  HENT:     Head: Normocephalic and atraumatic.  Eyes:     Conjunctiva/sclera: Conjunctivae normal.  Cardiovascular:     Rate and Rhythm: Normal rate and regular rhythm.     Heart sounds: No murmur heard. Pulmonary:     Effort: Pulmonary effort is normal. No respiratory distress.     Breath sounds: Normal breath sounds.  Abdominal:     Palpations: Abdomen is soft.     Tenderness: There is abdominal tenderness in the right upper quadrant. There is guarding.  Musculoskeletal:        General: No swelling.     Cervical back: Neck supple.  Skin:    General: Skin is warm and dry.     Capillary Refill: Capillary refill takes less than 2 seconds.  Neurological:     Mental Status: He is alert.   Psychiatric:        Mood and Affect: Mood normal.    ED Results / Procedures / Treatments   Labs (all labs ordered are listed, but only abnormal results are displayed) Labs Reviewed  COMPREHENSIVE METABOLIC PANEL - Abnormal; Notable for the following components:      Result Value   Chloride 96 (*)    Glucose, Bld 111 (*)    Total Bilirubin 3.5 (*)    All other components within normal limits  URINALYSIS, ROUTINE W REFLEX MICROSCOPIC - Abnormal; Notable for the following components:   Bilirubin Urine SMALL (*)    Ketones, ur 15 (*)    All other components within normal limits  LACTIC ACID, PLASMA - Abnormal; Notable for the following components:   Lactic Acid, Venous 2.4 (*)    All other components within normal limits  RESP PANEL BY RT-PCR (FLU A&B, COVID) ARPGX2  CULTURE, BLOOD (ROUTINE X 2)  CULTURE, BLOOD (ROUTINE X 2)  URINE CULTURE  URINE CULTURE  CBC WITH DIFFERENTIAL/PLATELET  LACTIC ACID, PLASMA  PROTIME-INR  APTT  RAPID URINE DRUG SCREEN, HOSP PERFORMED  HEPARIN LEVEL (UNFRACTIONATED)  PROTIME-INR  CORTISOL-AM, BLOOD  PROCALCITONIN  COMPREHENSIVE METABOLIC PANEL  MAGNESIUM  CBC WITH DIFFERENTIAL/PLATELET  TSH  LACTIC ACID, PLASMA  TROPONIN I (HIGH SENSITIVITY)  TROPONIN I (HIGH SENSITIVITY)    EKG EKG Interpretation  Date/Time:  Thursday May 12 2021 20:30:28 EST Ventricular Rate:  153 PR Interval:  138 QRS Duration: 78 QT Interval:  338 QTC Calculation: 539 R Axis:   44 Text Interpretation: Sinus tachycardia Nonspecific ST abnormality Abnormal ECG Confirmed by Regan Lemming ([id number]) on 05/12/2021 9:26:36 PM  Radiology DG Chest 1 View  Result Date: 05/12/2021 CLINICAL DATA:  The technologist did not provide a history. EXAM: CHEST  1 VIEW COMPARISON:  PA Lat 04/13/2021 FINDINGS: The heart size and mediastinal contours are within normal limits apart from mild aortic tortuosity. Both lungs are clear. The visualized skeletal structures are  unremarkable aside from spinal bridging enthesopathy of DISH. IMPRESSION: No evidence of  acute chest disease or interval changes. Electronically Signed   By: Telford Nab M.D.   On: 05/12/2021 21:29   CT Angio Chest Pulmonary Embolism (PE) W or WO Contrast  Result Date: 05/12/2021 CLINICAL DATA:  Tremors and sepsis. EXAM: CT ANGIOGRAPHY CHEST WITH CONTRAST TECHNIQUE: Multidetector CT imaging of the chest was performed using the standard protocol during bolus administration of intravenous contrast. Multiplanar CT image reconstructions and MIPs were obtained to evaluate the vascular anatomy. CONTRAST:  120m OMNIPAQUE IOHEXOL 350 MG/ML SOLN COMPARISON:  Oct 20, 2015 FINDINGS: Cardiovascular: A small amount of intraluminal low attenuation is seen within a subsegmental lower lobe branch of the left pulmonary artery (axial CT images 151 through 158, CT series 5/coronal reformatted images 88 through 91, CT series 8). Normal heart size. No pericardial effusion. Mediastinum/Nodes: No enlarged mediastinal, hilar, or axillary lymph nodes. Thyroid gland, trachea, and esophagus demonstrate no significant findings. Lungs/Pleura: Mild atelectatic changes are seen within the posterolateral aspect of the left upper lobe. A 1.1 cm noncalcified lung nodule is seen within the posterior aspect of the left upper lobe (axial CT image 75, CT series 4). This represents a new finding when compared to the prior exam. There is no evidence of a pleural effusion or pneumothorax. Upper Abdomen: No acute abnormality. Musculoskeletal: Multilevel degenerative changes seen throughout the thoracic spine. Review of the MIP images confirms the above findings. IMPRESSION: 1. Small amount of pulmonary embolism within a subsegmental lower lobe branch of the left pulmonary artery. 2. 1.1 cm noncalcified lung nodule within the posterior aspect of the left upper lobe. This represents a new finding when compared to the prior exam. Consider one of the  following in 3 months for both low-risk and high-risk individuals: (a) repeat chest CT, (b) follow-up PET-CT, or (c) tissue sampling. This recommendation follows the consensus statement: Guidelines for Management of Incidental Pulmonary Nodules Detected on CT Images: From the Fleischner Society 2017; Radiology 2017; 284:228-243. Electronically Signed   By: TVirgina NorfolkM.D.   On: 05/12/2021 23:26   CT ABDOMEN PELVIS W CONTRAST  Result Date: 05/12/2021 CLINICAL DATA:  Abdominal pain and fevers EXAM: CT ABDOMEN AND PELVIS WITH CONTRAST TECHNIQUE: Multidetector CT imaging of the abdomen and pelvis was performed using the standard protocol following bolus administration of intravenous contrast. CONTRAST:  1062mOMNIPAQUE IOHEXOL 350 MG/ML SOLN COMPARISON:  04/03/2021 FINDINGS: Lower chest: No acute abnormality. Hepatobiliary: Gallbladder is partially distended. Significant Peri cholecystic inflammatory changes are noted with similar findings in the adjacent hepatic tissue. These changes have increased in the interval from the prior exam and again are highly suspicious for acute cholecystitis. Prominence of the common bile duct is again seen and stable. Pancreas: Pancreas appears within normal limits. Spleen: Normal in size without focal abnormality. Adrenals/Urinary Tract: Adrenal glands are within normal limits. Kidneys again demonstrate a enhancing mass lesion within the substance of the right kidney measuring up to 3.3 cm suspicious for renal cell carcinoma and stable in appearance from the prior exam. Urologic consultation is recommended. No obstructive changes are seen. Stable renal cysts are noted in the right kidney. The bladder is decompressed. Stomach/Bowel: Diverticular change of the colon is noted without evidence of diverticulitis. The appendix is barium filled and within normal limits. Small bowel and stomach are unremarkable. Vascular/Lymphatic: Atherosclerotic calcifications of the aorta are  noted. Normal tapering is seen. No aneurysm is noted. Some upper abdominal peripancreatic lymph nodes are seen which appears centrally necrotic. The overall appearance is stable from the prior  study. Additional node located between the head of the pancreas and the left lobe of the liver is noted as well also stable in appearance. Persistent soft tissue density is noted surrounding proximal superior vena cava. Reproductive: Prostate appears within normal limits. Other: No abdominal wall hernia or abnormality. No abdominopelvic ascites. Musculoskeletal: Degenerative changes of lumbar spine are noted. IMPRESSION: Increase in the inflammatory changes surrounding the gallbladder and extending into the adjacent liver again suspicious for acute cholecystitis with localized hepatic inflammatory change. Correlate with pending ultrasound exam Upper abdominal lymphadenopathy which appears necrotic and stable in appearance from the prior exam. Patient is scheduled for PET-CT according to outpatient clinic notes. Stable enhancing right renal mass suspicious for renal cell carcinoma. By history the patient has met with urology. Remainder of the exam is stable from the prior study. Electronically Signed   By: Inez Catalina M.D.   On: 05/12/2021 23:34   US Abdomen Limited RUQ (LIVER/GB)  Result Date: 05/12/2021 CLINICAL DATA:  Fevers EXAM: ULTRASOUND ABDOMEN LIMITED RIGHT UPPER QUADRANT COMPARISON:  CT from earlier in the same day FINDINGS: Gallbladder: Gallbladder again shows evidence of cholelithiasis with gallbladder wall thickening and mild pericholecystic fluid. Negative sonographic Murphy's sign is elicited however. The degree of inflammatory change in the adjacent liver is less well appreciated on this exam than on recent CT. Common bile duct: Diameter: 6.4 mm Liver: No focal lesion identified. Within normal limits in parenchymal echogenicity. Portal vein is patent on color Doppler imaging with normal direction of blood  flow towards the liver. Other: None. IMPRESSION: Stable changes in the gallbladder with cholelithiasis and gallbladder wall thickening. Negative sonographic Murphy's sign is elicited however. This may be in part due to underlying medications. Electronically Signed   By: Inez Catalina M.D.   On: 05/12/2021 23:36    Procedures .Critical Care Performed by: Regan Lemming, MD Authorized by: Regan Lemming, MD   Critical care provider statement:    Critical care time (minutes):  62   Critical care was necessary to treat or prevent imminent or life-threatening deterioration of the following conditions:  Sepsis   Critical care was time spent personally by me on the following activities:  Development of treatment plan with patient or surrogate, discussions with consultants, examination of patient, obtaining history from patient or surrogate, ordering and review of laboratory studies, ordering and performing treatments and interventions, ordering and review of radiographic studies, re-evaluation of patient's condition and review of old charts   Care discussed with: admitting provider     Medications Ordered in ED Medications  lactated ringers infusion ( Intravenous New Bag/Given 05/12/21 2224)  norepinephrine (LEVOPHED) 73m in 2551m(0.016 mg/mL) premix infusion (0 mcg/min Intravenous Hold 05/12/21 2232)  heparin ADULT infusion 100 units/mL (25000 units/25058m(1,200 Units/hr Intravenous New Bag/Given 05/13/21 0036)  aspirin EC tablet 81 mg (has no administration in time range)  amLODipine (NORVASC) tablet 5 mg (has no administration in time range)  atorvastatin (LIPITOR) tablet 20 mg (has no administration in time range)  donepezil (ARICEPT) tablet 10 mg (has no administration in time range)  memantine (NAMENDA) tablet 10 mg (has no administration in time range)  sucralfate (CARAFATE) tablet 1 g (has no administration in time range)  tiZANidine (ZANAFLEX) tablet 2 mg (has no administration in time range)   piperacillin-tazobactam (ZOSYN) IVPB 3.375 g (has no administration in time range)  hydrocortisone sodium succinate (SOLU-CORTEF) 100 MG injection 100 mg (has no administration in time range)  acetaminophen (TYLENOL) tablet 650 mg (has no  administration in time range)    Or  acetaminophen (TYLENOL) suppository 650 mg (has no administration in time range)  oxyCODONE (Oxy IR/ROXICODONE) immediate release tablet 5 mg (has no administration in time range)  ondansetron (ZOFRAN) tablet 4 mg (has no administration in time range)    Or  ondansetron (ZOFRAN) injection 4 mg (has no administration in time range)  lactated ringers bolus 1,000 mL (0 mLs Intravenous Stopped 05/12/21 2233)    And  lactated ringers bolus 1,000 mL (0 mLs Intravenous Stopped 05/13/21 0030)    And  lactated ringers bolus 250 mL (0 mLs Intravenous Stopped 05/12/21 2234)  piperacillin-tazobactam (ZOSYN) IVPB 3.375 g (0 g Intravenous Stopped 05/12/21 2234)  HYDROmorphone (DILAUDID) injection 1 mg (1 mg Intravenous Given 05/12/21 2155)  iohexol (OMNIPAQUE) 350 MG/ML injection 100 mL (100 mLs Intravenous Contrast Given 05/12/21 2313)  heparin bolus via infusion 4,000 Units (4,000 Units Intravenous Bolus from Bag 05/13/21 0036)  lactated ringers bolus 500 mL (0 mLs Intravenous Stopped 05/13/21 0305)    ED Course  I have reviewed the triage vital signs and the nursing notes.  Pertinent labs & imaging results that were available during my care of the patient were reviewed by me and considered in my medical decision making (see chart for details).    MDM Rules/Calculators/A&P                           80 year old male with a history of CAD, nonocclusive, GERD, hypertension, dementia, TIA, diagnosis of gallstone disease with a planned elective cholecystectomy for late December, presenting to the emergency department with concern for chills, right upper quadrant abdominal pain, elevated heart rate at home.  Right upper quadrant pain is  sharp, shooting, nonradiating, worsened with movement, better with rest. History is provided by the patient's daughter as the patient was unable to provide more medical history.  He has had roughly 1 day of chills and worsening right upper quadrant abdominal pain.  He had previously presented to the emergency department October for abdominal pain and had ulcer of the abdomen that was concerning for possible cholecystitis but however it did not have a leukocytosis at that time, normal biliary labs, surgery had planned for outpatient management.    On arrival, the patient was afebrile by oral temperature, initially hypotensive and confused, BP 88/70 and difficult to appreciate in triage.  The patient was emergently bedded with subsequent improvement in his blood pressures.  He was tachycardic to the 120s concerning for sepsis with a potential intra-abdominal source.   Labs: CBC without a leukocytosis, anemia or platelet abnormality, COVID-19 and influenza PCR resulted normal, CMP without significant  Elevation of the patient's LFTs, alkaline phosphatase, elevated T bili to 3.5.  Urinalysis without evidence of UTI, lactic acid initially elevated to 2.4.  The patient was volume resuscitated with 30 cc/kg IV fluid bolus.  Blood cultures and cultures were collected prior to administration of antibiotics.  He was covered with broad-spectrum antibiotics for potential intra-abdominal source given his known history of gallstones and right upper quadrant tenderness on exam.  In consultation with pharmacy, patient was administered IV Zosyn.  CT of the abdomen pelvis was performed which was concerning for acute cholecystitis with concern for necrotizing lymph nodes in the abdomen.  CTA of the chest was concerning for small PE.  Dr. Louanna Raw of general surgery was consulted due to concern for acute cholecystitis noted on CT imaging.  This was confirmed By  the patient's right upper quadrant ultrasound was  revealed stable changes in the gallbladder with cholelithiasis and gallbladder wall thickening.  Patient did have negative sonographic Murphy sign however he had previously received IV Dilaudid prior to the exam.  Upon reassessment, the patient appears clinically improved. The patient is not currently hypotensive and has a MAP of >65. The patient's volume status appears to be improved after fluid resuscitation.  Based off the presentation and lab findings, the patient meets criteria for severe sepsis.  [SEVERE SEPSIS] HYPOTENSION (Nursing BPA for hypotension = Is this sepsis?) CREATININE > 2.0, or urine output < 0.5 mL/kg/hour for 2 hours  BILIRUBIN > 2 mg/dL (34.2 mmol/L)  PLATELET COUNT < 100,000  INR > 1.5 or aPTT > 60 sec  LACTATE > 2 mmol/L   [SEPTIC SHOCK] HYPOTENSION REFRACTORY TO 30 cc/kg OF FLUIDS LACTATE > 4 mmol/L  While in the ED, the patient was given: Medications  lactated ringers infusion ( Intravenous New Bag/Given 05/12/21 2224)  norepinephrine (LEVOPHED) 57m in 2599m(0.016 mg/mL) premix infusion (0 mcg/min Intravenous Hold 05/12/21 2232)  heparin ADULT infusion 100 units/mL (25000 units/25050m(1,200 Units/hr Intravenous New Bag/Given 05/13/21 0036)  aspirin EC tablet 81 mg (has no administration in time range)  amLODipine (NORVASC) tablet 5 mg (has no administration in time range)  atorvastatin (LIPITOR) tablet 20 mg (has no administration in time range)  donepezil (ARICEPT) tablet 10 mg (has no administration in time range)  memantine (NAMENDA) tablet 10 mg (has no administration in time range)  sucralfate (CARAFATE) tablet 1 g (has no administration in time range)  tiZANidine (ZANAFLEX) tablet 2 mg (has no administration in time range)  piperacillin-tazobactam (ZOSYN) IVPB 3.375 g (has no administration in time range)  hydrocortisone sodium succinate (SOLU-CORTEF) 100 MG injection 100 mg (has no administration in time range)  acetaminophen (TYLENOL) tablet 650 mg  (has no administration in time range)    Or  acetaminophen (TYLENOL) suppository 650 mg (has no administration in time range)  oxyCODONE (Oxy IR/ROXICODONE) immediate release tablet 5 mg (has no administration in time range)  ondansetron (ZOFRAN) tablet 4 mg (has no administration in time range)    Or  ondansetron (ZOFRAN) injection 4 mg (has no administration in time range)  lactated ringers bolus 1,000 mL (0 mLs Intravenous Stopped 05/12/21 2233)    And  lactated ringers bolus 1,000 mL (0 mLs Intravenous Stopped 05/13/21 0030)    And  lactated ringers bolus 250 mL (0 mLs Intravenous Stopped 05/12/21 2234)  piperacillin-tazobactam (ZOSYN) IVPB 3.375 g (0 g Intravenous Stopped 05/12/21 2234)  HYDROmorphone (DILAUDID) injection 1 mg (1 mg Intravenous Given 05/12/21 2155)  iohexol (OMNIPAQUE) 350 MG/ML injection 100 mL (100 mLs Intravenous Contrast Given 05/12/21 2313)  heparin bolus via infusion 4,000 Units (4,000 Units Intravenous Bolus from Bag 05/13/21 0036)  lactated ringers bolus 500 mL (0 mLs Intravenous Stopped 05/13/21 0305)     Full recommendations by surgery were pending at time of signout.  Signout was given to Dr. RanKingsley Callander 2330.  Please see his note for further MDM.  Plan at time of signout to follow-up surgery recommendations, admit the patient to the hospitalist service for further monitoring, IV antimicrobials.  Consideration given to heparin treatment pending surgery recommendations for the patient's acute PE.   Final Clinical Impression(s) / ED Diagnoses Final diagnoses:  RUQ pain  Fever  Sepsis with acute organ dysfunction without septic shock, due to unspecified organism, unspecified type ([place], unspecified pulmonary embolism  type, unspecified whether acute cor pulmonale present [facility])    Rx / [place] Orders ED Discharge Orders     None        Regan Lemming, MD 05/13/21 [phone]

## 2021-05-12 NOTE — ED Notes (Signed)
Pt brought back to triage by sort tech. Family present, reports pt has been complaining of chest pain and shortness of breath.

## 2021-05-12 NOTE — ED Notes (Signed)
Pt's family informed that the pt was having irregular heart beat and chest pain. The pt was brought back to triage for reassessment and EKG.

## 2021-05-13 ENCOUNTER — Other Ambulatory Visit: Payer: Self-pay

## 2021-05-13 ENCOUNTER — Inpatient Hospital Stay (HOSPITAL_COMMUNITY): Payer: Medicare Other

## 2021-05-13 DIAGNOSIS — K810 Acute cholecystitis: Secondary | ICD-10-CM

## 2021-05-13 DIAGNOSIS — I5022 Chronic systolic (congestive) heart failure: Secondary | ICD-10-CM | POA: Diagnosis present

## 2021-05-13 DIAGNOSIS — E876 Hypokalemia: Secondary | ICD-10-CM | POA: Diagnosis not present

## 2021-05-13 DIAGNOSIS — Z791 Long term (current) use of non-steroidal anti-inflammatories (NSAID): Secondary | ICD-10-CM | POA: Diagnosis not present

## 2021-05-13 DIAGNOSIS — K219 Gastro-esophageal reflux disease without esophagitis: Secondary | ICD-10-CM | POA: Diagnosis present

## 2021-05-13 DIAGNOSIS — I10 Essential (primary) hypertension: Secondary | ICD-10-CM | POA: Diagnosis not present

## 2021-05-13 DIAGNOSIS — K2100 Gastro-esophageal reflux disease with esophagitis, without bleeding: Secondary | ICD-10-CM | POA: Diagnosis not present

## 2021-05-13 DIAGNOSIS — I110 Hypertensive heart disease with heart failure: Secondary | ICD-10-CM | POA: Diagnosis present

## 2021-05-13 DIAGNOSIS — A419 Sepsis, unspecified organism: Secondary | ICD-10-CM | POA: Diagnosis not present

## 2021-05-13 DIAGNOSIS — A4151 Sepsis due to Escherichia coli [E. coli]: Secondary | ICD-10-CM | POA: Diagnosis present

## 2021-05-13 DIAGNOSIS — R7881 Bacteremia: Secondary | ICD-10-CM | POA: Diagnosis not present

## 2021-05-13 DIAGNOSIS — E785 Hyperlipidemia, unspecified: Secondary | ICD-10-CM | POA: Diagnosis present

## 2021-05-13 DIAGNOSIS — Z7982 Long term (current) use of aspirin: Secondary | ICD-10-CM | POA: Diagnosis not present

## 2021-05-13 DIAGNOSIS — I2699 Other pulmonary embolism without acute cor pulmonale: Secondary | ICD-10-CM | POA: Diagnosis present

## 2021-05-13 DIAGNOSIS — Z85048 Personal history of other malignant neoplasm of rectum, rectosigmoid junction, and anus: Secondary | ICD-10-CM | POA: Diagnosis not present

## 2021-05-13 DIAGNOSIS — I429 Cardiomyopathy, unspecified: Secondary | ICD-10-CM | POA: Diagnosis present

## 2021-05-13 DIAGNOSIS — Z8673 Personal history of transient ischemic attack (TIA), and cerebral infarction without residual deficits: Secondary | ICD-10-CM | POA: Diagnosis not present

## 2021-05-13 DIAGNOSIS — Z79899 Other long term (current) drug therapy: Secondary | ICD-10-CM | POA: Diagnosis not present

## 2021-05-13 DIAGNOSIS — R911 Solitary pulmonary nodule: Secondary | ICD-10-CM | POA: Diagnosis present

## 2021-05-13 DIAGNOSIS — K750 Abscess of liver: Secondary | ICD-10-CM | POA: Diagnosis present

## 2021-05-13 DIAGNOSIS — F17210 Nicotine dependence, cigarettes, uncomplicated: Secondary | ICD-10-CM | POA: Diagnosis present

## 2021-05-13 DIAGNOSIS — Z8249 Family history of ischemic heart disease and other diseases of the circulatory system: Secondary | ICD-10-CM | POA: Diagnosis not present

## 2021-05-13 DIAGNOSIS — Z20822 Contact with and (suspected) exposure to covid-19: Secondary | ICD-10-CM | POA: Diagnosis present

## 2021-05-13 DIAGNOSIS — R1011 Right upper quadrant pain: Secondary | ICD-10-CM | POA: Diagnosis present

## 2021-05-13 DIAGNOSIS — I2693 Single subsegmental pulmonary embolism without acute cor pulmonale: Secondary | ICD-10-CM | POA: Diagnosis present

## 2021-05-13 DIAGNOSIS — E871 Hypo-osmolality and hyponatremia: Secondary | ICD-10-CM | POA: Diagnosis not present

## 2021-05-13 DIAGNOSIS — C253 Malignant neoplasm of pancreatic duct: Secondary | ICD-10-CM | POA: Diagnosis present

## 2021-05-13 DIAGNOSIS — R6520 Severe sepsis without septic shock: Secondary | ICD-10-CM | POA: Diagnosis present

## 2021-05-13 DIAGNOSIS — I2510 Atherosclerotic heart disease of native coronary artery without angina pectoris: Secondary | ICD-10-CM | POA: Diagnosis present

## 2021-05-13 DIAGNOSIS — B9620 Unspecified Escherichia coli [E. coli] as the cause of diseases classified elsewhere: Secondary | ICD-10-CM | POA: Diagnosis not present

## 2021-05-13 DIAGNOSIS — F0390 Unspecified dementia without behavioral disturbance: Secondary | ICD-10-CM | POA: Diagnosis present

## 2021-05-13 LAB — MAGNESIUM: Magnesium: 1.7 mg/dL (ref 1.7–2.4)

## 2021-05-13 LAB — CBC WITH DIFFERENTIAL/PLATELET
Abs Immature Granulocytes: 0.04 10*3/uL (ref 0.00–0.07)
Basophils Absolute: 0 10*3/uL (ref 0.0–0.1)
Basophils Relative: 0 %
Eosinophils Absolute: 0 10*3/uL (ref 0.0–0.5)
Eosinophils Relative: 0 %
HCT: 42.5 % (ref 39.0–52.0)
Hemoglobin: 14.1 g/dL (ref 13.0–17.0)
Immature Granulocytes: 0 %
Lymphocytes Relative: 6 %
Lymphs Abs: 0.6 10*3/uL — ABNORMAL LOW (ref 0.7–4.0)
MCH: 29.8 pg (ref 26.0–34.0)
MCHC: 33.2 g/dL (ref 30.0–36.0)
MCV: 89.9 fL (ref 80.0–100.0)
Monocytes Absolute: 0.3 10*3/uL (ref 0.1–1.0)
Monocytes Relative: 3 %
Neutro Abs: 9.6 10*3/uL — ABNORMAL HIGH (ref 1.7–7.7)
Neutrophils Relative %: 91 %
Platelets: 171 10*3/uL (ref 150–400)
RBC: 4.73 MIL/uL (ref 4.22–5.81)
RDW: 15 % (ref 11.5–15.5)
WBC: 10.5 10*3/uL (ref 4.0–10.5)
nRBC: 0 % (ref 0.0–0.2)

## 2021-05-13 LAB — BLOOD CULTURE ID PANEL (REFLEXED) - BCID2

## 2021-05-13 LAB — COMPREHENSIVE METABOLIC PANEL
ALT: 22 U/L (ref 0–44)
AST: 44 U/L — ABNORMAL HIGH (ref 15–41)
Albumin: 3 g/dL — ABNORMAL LOW (ref 3.5–5.0)
Alkaline Phosphatase: 94 U/L (ref 38–126)
Anion gap: 7 (ref 5–15)
BUN: 10 mg/dL (ref 8–23)
CO2: 27 mmol/L (ref 22–32)
Calcium: 8.7 mg/dL — ABNORMAL LOW (ref 8.9–10.3)
Chloride: 97 mmol/L — ABNORMAL LOW (ref 98–111)
Creatinine, Ser: 0.78 mg/dL (ref 0.61–1.24)
GFR, Estimated: >60 mL/min (ref >60)
Glucose, Bld: 96 mg/dL (ref 70–99)
Potassium: 5.2 mmol/L — ABNORMAL HIGH (ref 3.5–5.1)
Sodium: 131 mmol/L — ABNORMAL LOW (ref 135–145)
Total Bilirubin: 3.5 mg/dL — ABNORMAL HIGH (ref 0.3–1.2)
Total Protein: 6.1 g/dL — ABNORMAL LOW (ref 6.5–8.1)

## 2021-05-13 LAB — HEPARIN LEVEL (UNFRACTIONATED): Heparin Unfractionated: 0.33 IU/mL (ref 0.30–0.70)

## 2021-05-13 LAB — ECHOCARDIOGRAM COMPLETE
AR max vel: 3.12 cm2
AV Area VTI: 3.05 cm2
AV Area mean vel: 3.01 cm2
AV Mean grad: 1 mmHg
AV Peak grad: 2.7 mmHg
Ao pk vel: 0.82 m/s
Area-P 1/2: 3.83 cm2
Calc EF: 35.5 %
S' Lateral: 3 cm
Single Plane A2C EF: 37.3 %
Single Plane A4C EF: 37.2 %

## 2021-05-13 LAB — RAPID URINE DRUG SCREEN, HOSP PERFORMED
Amphetamines: NOT DETECTED
Barbiturates: NOT DETECTED
Benzodiazepines: NOT DETECTED
Cocaine: NOT DETECTED
Opiates: NOT DETECTED
Tetrahydrocannabinol: NOT DETECTED

## 2021-05-13 LAB — PROTIME-INR
INR: 1.3 — ABNORMAL HIGH (ref 0.8–1.2)
Prothrombin Time: 16 seconds — ABNORMAL HIGH (ref 11.4–15.2)

## 2021-05-13 LAB — LACTIC ACID, PLASMA
Lactic Acid, Venous: 2.4 mmol/L (ref 0.5–1.9)
Lactic Acid, Venous: 1.9 mmol/L (ref 0.5–1.9)
Lactic Acid, Venous: 1.2 mmol/L (ref 0.5–1.9)

## 2021-05-13 LAB — CORTISOL-AM, BLOOD: Cortisol - AM: >100 ug/dL — ABNORMAL HIGH (ref 6.7–22.6)

## 2021-05-13 LAB — TROPONIN I (HIGH SENSITIVITY): Troponin I (High Sensitivity): 17 ng/L (ref <18)

## 2021-05-13 LAB — POTASSIUM: Potassium: 3.6 mmol/L (ref 3.5–5.1)

## 2021-05-13 LAB — PROCALCITONIN: Procalcitonin: 12.23 ng/mL

## 2021-05-13 LAB — TSH: TSH: 0.433 u[IU]/mL (ref 0.350–4.500)

## 2021-05-13 MED ORDER — DONEPEZIL HCL 10 MG PO TABS
10.0000 mg | ORAL_TABLET | Freq: Every day | ORAL | Status: DC
  Administered 2021-05-13 – 2021-05-20 (×8): 10 mg via ORAL
  Filled 2021-05-13 (×9): qty 1

## 2021-05-13 MED ORDER — OXYCODONE HCL 5 MG PO TABS
5.0000 mg | ORAL_TABLET | ORAL | Status: DC | PRN
  Administered 2021-05-15 – 2021-05-21 (×4): 5 mg via ORAL
  Filled 2021-05-13 (×6): qty 1

## 2021-05-13 MED ORDER — ENOXAPARIN SODIUM 80 MG/0.8ML IJ SOSY
75.0000 mg | PREFILLED_SYRINGE | Freq: Two times a day (BID) | INTRAMUSCULAR | Status: DC
  Filled 2021-05-13: qty 0.75

## 2021-05-13 MED ORDER — LACTATED RINGERS IV BOLUS
500.0000 mL | Freq: Once | INTRAVENOUS | Status: AC
  Administered 2021-05-13: 500 mL via INTRAVENOUS

## 2021-05-13 MED ORDER — HEPARIN BOLUS VIA INFUSION
4000.0000 [IU] | Freq: Once | INTRAVENOUS | Status: AC
  Administered 2021-05-13: 4000 [IU] via INTRAVENOUS
  Filled 2021-05-13: qty 4000

## 2021-05-13 MED ORDER — LACTATED RINGERS IV SOLN: INTRAVENOUS | Status: DC

## 2021-05-13 MED ORDER — HYDROCORTISONE SOD SUC (PF) 100 MG IJ SOLR
100.0000 mg | Freq: Two times a day (BID) | INTRAMUSCULAR | Status: DC
  Administered 2021-05-13: 100 mg via INTRAVENOUS
  Filled 2021-05-13: qty 2

## 2021-05-13 MED ORDER — MEMANTINE HCL 10 MG PO TABS
10.0000 mg | ORAL_TABLET | Freq: Two times a day (BID) | ORAL | Status: DC
  Administered 2021-05-13 – 2021-05-21 (×18): 10 mg via ORAL
  Filled 2021-05-13 (×19): qty 1

## 2021-05-13 MED ORDER — PERFLUTREN LIPID MICROSPHERE
1.0000 mL | INTRAVENOUS | Status: AC | PRN
Start: 2021-05-13 — End: 2021-05-13
  Administered 2021-05-13: 2 mL via INTRAVENOUS
  Filled 2021-05-13: qty 10

## 2021-05-13 MED ORDER — GADOBUTROL 1 MMOL/ML IV SOLN
7.5000 mL | Freq: Once | INTRAVENOUS | Status: AC | PRN
  Administered 2021-05-13: 7.5 mL via INTRAVENOUS

## 2021-05-13 MED ORDER — ATORVASTATIN CALCIUM 10 MG PO TABS
20.0000 mg | ORAL_TABLET | Freq: Every day | ORAL | Status: DC
  Administered 2021-05-13 – 2021-05-21 (×9): 20 mg via ORAL
  Filled 2021-05-13 (×9): qty 2

## 2021-05-13 MED ORDER — PIPERACILLIN-TAZOBACTAM 3.375 G IVPB
3.3750 g | Freq: Three times a day (TID) | INTRAVENOUS | Status: DC
  Administered 2021-05-13 – 2021-05-14 (×5): 3.375 g via INTRAVENOUS
  Filled 2021-05-13 (×6): qty 50

## 2021-05-13 MED ORDER — ONDANSETRON HCL 4 MG PO TABS: 4.0000 mg | ORAL_TABLET | Freq: Four times a day (QID) | ORAL | Status: DC | PRN

## 2021-05-13 MED ORDER — TIZANIDINE HCL 2 MG PO TABS
2.0000 mg | ORAL_TABLET | Freq: Every evening | ORAL | Status: DC | PRN
  Administered 2021-05-16: 2 mg via ORAL
  Filled 2021-05-13 (×2): qty 1

## 2021-05-13 MED ORDER — AMLODIPINE BESYLATE 5 MG PO TABS: 5.0000 mg | ORAL_TABLET | Freq: Every day | ORAL | Status: DC

## 2021-05-13 MED ORDER — ACETAMINOPHEN 650 MG RE SUPP: 650.0000 mg | Freq: Four times a day (QID) | RECTAL | Status: DC | PRN

## 2021-05-13 MED ORDER — HEPARIN (PORCINE) 25000 UT/250ML-% IV SOLN
1200.0000 [IU]/h | INTRAVENOUS | Status: DC
  Administered 2021-05-13: 1200 [IU]/h via INTRAVENOUS
  Filled 2021-05-13: qty 250

## 2021-05-13 MED ORDER — SUCRALFATE 1 G PO TABS: 1.0000 g | ORAL_TABLET | Freq: Three times a day (TID) | ORAL | Status: DC

## 2021-05-13 MED ORDER — ENOXAPARIN SODIUM 80 MG/0.8ML IJ SOSY
75.0000 mg | PREFILLED_SYRINGE | Freq: Two times a day (BID) | INTRAMUSCULAR | Status: DC
  Filled 2021-05-13 (×2): qty 0.75

## 2021-05-13 MED ORDER — ENOXAPARIN SODIUM 80 MG/0.8ML IJ SOSY
75.0000 mg | PREFILLED_SYRINGE | Freq: Once | INTRAMUSCULAR | Status: AC
  Administered 2021-05-13: 75 mg via SUBCUTANEOUS
  Filled 2021-05-13: qty 0.75
  Filled 2021-05-13: qty 0.8

## 2021-05-13 MED ORDER — ONDANSETRON HCL 4 MG/2ML IJ SOLN
4.0000 mg | Freq: Four times a day (QID) | INTRAMUSCULAR | Status: DC | PRN
  Administered 2021-05-18 (×2): 4 mg via INTRAVENOUS
  Filled 2021-05-13 (×2): qty 2

## 2021-05-13 MED ORDER — ASPIRIN EC 81 MG PO TBEC
81.0000 mg | DELAYED_RELEASE_TABLET | Freq: Every day | ORAL | Status: DC
  Administered 2021-05-13 – 2021-05-21 (×9): 81 mg via ORAL
  Filled 2021-05-13 (×9): qty 1

## 2021-05-13 MED ORDER — ACETAMINOPHEN 325 MG PO TABS
650.0000 mg | ORAL_TABLET | Freq: Four times a day (QID) | ORAL | Status: DC | PRN
  Administered 2021-05-14 – 2021-05-15 (×2): 650 mg via ORAL
  Filled 2021-05-13 (×2): qty 2

## 2021-05-13 NOTE — Consult Note (Signed)
Consulted for a percutaneous cholecystotomy tube.  Reviewed recent imaging and the gallbladder is decompressed and not amendable for a cholecystostomy tube.  There is a new finding in the liver adjacent to the gallbladder on recent CT and MR that is indeterminate.  This pericholecystic abnormality is not typical for an abscess.  Based on the abnormal lymph nodes in upper abdomen, I am concerned that the new liver finding could be neoplastic but hard to exclude inflammatory changes.  IR could try to aspirate or even biopsy this abnormal pericholecystic area if needed but again, not a candidate for a cholecystostomy tube because the gallbladder is completely decompressed.  Discussed with surgery PA, Melina Modena, and no plans for IR intervention this evening.

## 2021-05-13 NOTE — ED Notes (Signed)
Pt refused to have blood drawn RN notified

## 2021-05-13 NOTE — Progress Notes (Signed)
PHARMACY - PHYSICIAN COMMUNICATION CRITICAL VALUE ALERT - BLOOD CULTURE IDENTIFICATION (BCID)  Andre [name] is an 80 y.o. male who presented to [facility] on 05/12/2021 with a chief complaint of cholecystitis.  Assessment:  4 YOM diagnosed with cholelithiasis 2 weeks ago who presented with abd pain and rigors with concern for sepsis. Now with 1 of 4 BCx growing gram variable rod with BCID detecting E.coli with no resistance.   Name of physician (or Provider) Contacted: Elgergawy  Current antibiotics: Zosyn  Changes to prescribed antibiotics recommended:  Recommended Rocephin +/- Flagyl but MD desires to keep Zosyn for 1 more day and consider narrowing tomorrow (12/3).   Results for orders placed or performed during the hospital encounter of 05/12/21  Blood Culture ID Panel (Reflexed) (Collected: 05/12/2021  9:19 PM)  Result Value Ref Range   Enterococcus faecalis NOT DETECTED NOT DETECTED   Enterococcus Faecium NOT DETECTED NOT DETECTED   Listeria monocytogenes NOT DETECTED NOT DETECTED   Staphylococcus species NOT DETECTED NOT DETECTED   Staphylococcus aureus (BCID) NOT DETECTED NOT DETECTED   Staphylococcus epidermidis NOT DETECTED NOT DETECTED   Staphylococcus lugdunensis NOT DETECTED NOT DETECTED   Streptococcus species NOT DETECTED NOT DETECTED   Streptococcus agalactiae NOT DETECTED NOT DETECTED   Streptococcus pneumoniae NOT DETECTED NOT DETECTED   Streptococcus pyogenes NOT DETECTED NOT DETECTED   A.calcoaceticus-baumannii NOT DETECTED NOT DETECTED   Bacteroides fragilis NOT DETECTED NOT DETECTED   Enterobacterales DETECTED (A) NOT DETECTED   Enterobacter cloacae complex NOT DETECTED NOT DETECTED   Escherichia coli DETECTED (A) NOT DETECTED   Klebsiella aerogenes NOT DETECTED NOT DETECTED   Klebsiella oxytoca NOT DETECTED NOT DETECTED   Klebsiella pneumoniae NOT DETECTED NOT DETECTED   Proteus species NOT DETECTED NOT DETECTED   Salmonella species NOT DETECTED NOT DETECTED    Serratia marcescens NOT DETECTED NOT DETECTED   Haemophilus influenzae NOT DETECTED NOT DETECTED   Neisseria meningitidis NOT DETECTED NOT DETECTED   Pseudomonas aeruginosa NOT DETECTED NOT DETECTED   Stenotrophomonas maltophilia NOT DETECTED NOT DETECTED   Candida albicans NOT DETECTED NOT DETECTED   Candida auris NOT DETECTED NOT DETECTED   Candida glabrata NOT DETECTED NOT DETECTED   Candida krusei NOT DETECTED NOT DETECTED   Candida parapsilosis NOT DETECTED NOT DETECTED   Candida tropicalis NOT DETECTED NOT DETECTED   Cryptococcus neoformans/gattii NOT DETECTED NOT DETECTED   CTX-M ESBL NOT DETECTED NOT DETECTED   Carbapenem resistance IMP NOT DETECTED NOT DETECTED   Carbapenem resistance KPC NOT DETECTED NOT DETECTED   Carbapenem resistance NDM NOT DETECTED NOT DETECTED   Carbapenem resist OXA 48 LIKE NOT DETECTED NOT DETECTED   Carbapenem resistance VIM NOT DETECTED NOT DETECTED    Thank you for allowing pharmacy to be a part of this patient's care.  Alycia Rossetti, PharmD, BCPS Clinical Pharmacist 05/13/2021 2:59 PM   **Pharmacist phone directory can now be found on [email or web address] ([email or web address]).  Listed under [place].

## 2021-05-13 NOTE — Plan of Care (Signed)

## 2021-05-13 NOTE — ED Provider Notes (Signed)
Care assumed from Dr. Armandina Gemma.  Patient here with sepsis, fever, tachycardia, initial hypotension likely from intra-abdominal source.  Has known gallstones.  He was given IV fluids as well as antibiotics.  Levophed was ordered but not begun his blood pressure did improve Lactate and white blood cell count are normal.  Imaging is concerning for cholecystitis as well as lung nodule which family is informed of and need for followup.  Small pulmonary embolism is subsegmental. 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   Discussed with surgery who will evaluate. Dr. Thermon Leyland states patient will not go to the OR tonight and is okay for heparin drip.  Blood pressure remained stable in the 100s.  Levophed was not started.  Patient mentating well. Appears stable for stepdown admission.  Discussed with Dr. Clearence Ped.   .Critical Care Performed by: Ezequiel Essex, MD Authorized by: Ezequiel Essex, MD   Critical care provider statement:    Critical care time (minutes):  35   Critical care time was exclusive of:  Separately billable procedures and treating other patients   Critical care was necessary to treat or prevent imminent or life-threatening deterioration of the following conditions:  Sepsis   Critical care was time spent personally by me on the following activities:  Development of treatment plan with patient or surrogate, discussions with consultants, evaluation of patient's response to treatment, examination of patient, ordering and review of laboratory studies, ordering and review of radiographic studies, ordering and performing treatments and interventions, pulse oximetry, re-evaluation of patient's condition and review of old charts   I assumed direction of critical care for this patient from another provider in my specialty: yes     Care discussed with: admitting provider      Ezequiel Essex, MD 05/13/21 [phone]

## 2021-05-13 NOTE — Progress Notes (Signed)
PROGRESS NOTE    Andre [name]  QPY:[id number] DOB: [birth year] DOA: 05/12/2021 PCP: Biagio Borg, MD    Chief Complaint  Patient presents with   Tremors   Code Sepsis    Brief Narrative:   This is a no charge note as patient admitted earlier today by Dr. [place], patient was seen and examined, chart, imaging and labs were reviewed.   Assessment & Plan:   Principal Problem:   Sepsis ([place]) Active Problems:   Essential hypertension, benign   GERD (gastroesophageal reflux disease)   Hyperlipidemia   Acute cholecystitis   Pulmonary embolism (HCC)   Sepsis secondary to cholecystitis -Sepsis present on admission, as febrile 102.6, tachycardic 129, tachypneic 24, hypotensive 88/70 -Received stress dose IV steroids, responded to steroids and fluids, not require any pressors. -Work-up significant for cholecystitis, confirmed by MRI abdomen. -Continue with IV Zosyn -Continue with IV fluids -General surgery on board about timing of surgery, will keep n.p.o. for now.  Small PE - Continue heparin drip - no DVT on venous dopplers Follow on ECHO  Hypertension - Hold norvasc in setting of Hypotension 2/2 sepsis   HLD - Continue Statin  Right kidney leasion - followed by Dr Abner Greenspan, high suspension for malignancy   Dementia - Continue aricept and namenda  1.1 cm noncalcified lung nodule within the posterior aspect of the left upper lobe. - will need repeat CT in 3 months.  DVT prophylaxis: heparin GTT Code Status: Full Family Communication: D/W daughter at bedside Disposition:   Status is: Inpatient  Remains inpatient appropriate because: sepsis       Consultants:  General surgery  Subjective:  Patient reports he is feeling ill, weak, and complains of abdominal pain.  Objective: Vitals:   05/13/21 0800 05/13/21 0830 05/13/21 0900 05/13/21 1100  BP: 120/69 106/64 101/70 117/73  Pulse: (!) 111   76  Resp: $Remo'18 16 16 18  'nTlwe$ Temp:      TempSrc:      SpO2: 95%   100%    No intake or output data in the 24 hours ending 05/13/21 1405 There were no vitals filed for this visit.  Examination:  Awake Alert, Oriented X 3, frail, ill-appearing Symmetrical Chest wall movement, Good air movement bilaterally, CTAB RRR,No Gallops,Rubs or new Murmurs, No Parasternal Heave +ve B.Sounds, Abd Soft, No rebound - guarding or rigidity. No Cyanosis, Clubbing or edema, No new Rash or bruise      Data Reviewed: I have personally reviewed following labs and imaging studies  CBC: Recent Labs  Lab 05/12/21 1948 05/13/21 0508  WBC 8.9 10.5  NEUTROABS 7.6 9.6*  HGB 16.7 14.1  HCT 51.5 42.5  MCV 92.0 89.9  PLT 225 124    Basic Metabolic Panel: Recent Labs  Lab 05/12/21 1948 05/13/21 0508 05/13/21 0632  NA 136 131*  --   K 4.3 5.2* 3.6  CL 96* 97*  --   CO2 30 27  --   GLUCOSE 111* 96  --   BUN 10 10  --   CREATININE 0.94 0.78  --   CALCIUM 10.1 8.7*  --   MG  --  1.7  --     GFR: CrCl cannot be calculated (Unknown ideal weight.).  Liver Function Tests: Recent Labs  Lab 05/12/21 1948 05/13/21 0508  AST 26 44*  ALT 20 22  ALKPHOS 116 94  BILITOT 3.5* 3.5*  PROT 8.1 6.1*  ALBUMIN 4.1 3.0*    CBG: No results for input(s): GLUCAP in  the last 168 hours.   Recent Results (from the past 240 hour(s))  Resp Panel by RT-PCR (Flu A&B, Covid) Nasopharyngeal Swab     Status: None   Collection Time: 05/12/21  9:14 PM   Specimen: Nasopharyngeal Swab; Nasopharyngeal(NP) swabs in vial transport medium  Result Value Ref Range Status   SARS Coronavirus 2 by RT PCR NEGATIVE NEGATIVE Final    Comment: (NOTE) SARS-CoV-2 target nucleic acids are NOT DETECTED.  The SARS-CoV-2 RNA is generally detectable in upper respiratory specimens during the acute phase of infection. The lowest concentration of SARS-CoV-2 viral copies this assay can detect is 138 copies/mL. A negative result does not preclude SARS-Cov-2 infection and should not be used as the sole  basis for treatment or other patient management decisions. A negative result may occur with  improper specimen collection/handling, submission of specimen other than nasopharyngeal swab, presence of viral mutation(s) within the areas targeted by this assay, and inadequate number of viral copies(<138 copies/mL). A negative result must be combined with clinical observations, patient history, and epidemiological information. The expected result is Negative.  Fact Sheet for Patients:  BloggerCourse.com  Fact Sheet for Healthcare Providers:  SeriousBroker.it  This test is no t yet approved or cleared by the [place] FDA and  has been authorized for detection and/or diagnosis of SARS-CoV-2 by FDA under an Emergency Use Authorization (EUA). This EUA will remain  in effect (meaning this test can be used) for the duration of the COVID-19 declaration under Section 564(b)(1) of the Act, 21 U.S.C.section 360bbb-3(b)(1), unless the authorization is terminated  or revoked sooner.       Influenza A by PCR NEGATIVE NEGATIVE Final   Influenza B by PCR NEGATIVE NEGATIVE Final    Comment: (NOTE) The Xpert Xpress SARS-CoV-2/FLU/RSV plus assay is intended as an aid in the diagnosis of influenza from Nasopharyngeal swab specimens and should not be used as a sole basis for treatment. Nasal washings and aspirates are unacceptable for Xpert Xpress SARS-CoV-2/FLU/RSV testing.  Fact Sheet for Patients: BloggerCourse.com  Fact Sheet for Healthcare Providers: SeriousBroker.it  This test is not yet approved or cleared by the [place] FDA and has been authorized for detection and/or diagnosis of SARS-CoV-2 by FDA under an Emergency Use Authorization (EUA). This EUA will remain in effect (meaning this test can be used) for the duration of the COVID-19 declaration under Section 564(b)(1) of the Act,  21 U.S.C. section 360bbb-3(b)(1), unless the authorization is terminated or revoked.  Performed at [facility], [address] [place], [place] [address]   Blood Culture (routine x 2)     Status: Abnormal (Preliminary result)   Collection Time: 05/12/21  9:19 PM   Specimen: BLOOD  Result Value Ref Range Status   Specimen Description BLOOD SITE NOT SPECIFIED  Final   Special Requests   Final    BOTTLES DRAWN AEROBIC AND ANAEROBIC Blood Culture adequate volume   Culture  Setup Time (A)  Final    GRAM VARIABLE ROD AEROBIC BOTTLE ONLY Organism ID to follow    Culture   Final    NO GROWTH < 12 HOURS Performed at [facility], [address], [place], [place] [address]    Report Status PENDING  Incomplete  Blood Culture (routine x 2)     Status: None (Preliminary result)   Collection Time: 05/12/21  9:37 PM   Specimen: BLOOD  Result Value Ref Range Status   Specimen Description BLOOD SITE NOT SPECIFIED  Final   Special Requests   Final    BOTTLES DRAWN AEROBIC AND ANAEROBIC Blood Culture adequate volume   Culture   Final    NO GROWTH < 12 HOURS Performed at [facility], [address] [place], [place] [address]    Report Status PENDING  Incomplete         Radiology Studies: DG Chest 1 View  Result Date: 05/12/2021 CLINICAL DATA:  The technologist did not provide a history. EXAM: CHEST  1 VIEW COMPARISON:  PA Lat 04/13/2021 FINDINGS: The heart size and mediastinal contours are within normal limits apart from mild aortic tortuosity. Both lungs are clear. The visualized skeletal structures are unremarkable aside from spinal bridging enthesopathy of DISH. IMPRESSION: No evidence of acute chest disease or interval changes. Electronically Signed   By: Telford Nab M.D.   On: 05/12/2021 21:29   CT Angio Chest Pulmonary Embolism (PE) W or WO Contrast  Result Date: 05/12/2021 CLINICAL DATA:  Tremors and sepsis. EXAM: CT ANGIOGRAPHY CHEST WITH CONTRAST  TECHNIQUE: Multidetector CT imaging of the chest was performed using the standard protocol during bolus administration of intravenous contrast. Multiplanar CT image reconstructions and MIPs were obtained to evaluate the vascular anatomy. CONTRAST:  174mL OMNIPAQUE IOHEXOL 350 MG/ML SOLN COMPARISON:  Oct 20, 2015 FINDINGS: Cardiovascular: A small amount of intraluminal low attenuation is seen within a subsegmental lower lobe branch of the left pulmonary artery (axial CT images 151 through 158, CT series 5/coronal reformatted images 88 through 91, CT series 8). Normal heart size. No pericardial effusion. Mediastinum/Nodes: No enlarged mediastinal, hilar, or axillary lymph nodes. Thyroid gland, trachea, and esophagus demonstrate no significant findings. Lungs/Pleura: Mild atelectatic changes are seen within the posterolateral aspect of the left upper lobe. A 1.1 cm noncalcified lung nodule is seen within the posterior aspect of the left upper lobe (axial CT image 75, CT series 4). This represents a new finding when compared to the prior exam. There is no evidence of a pleural effusion or pneumothorax. Upper Abdomen: No acute abnormality. Musculoskeletal: Multilevel degenerative changes seen throughout the thoracic spine. Review of the MIP images confirms the above findings. IMPRESSION: 1. Small amount of pulmonary embolism within a subsegmental lower lobe branch of the left pulmonary artery. 2. 1.1 cm noncalcified lung nodule within the posterior aspect of the left upper lobe. This represents a new finding when compared to the prior exam. Consider one of the following in 3 months for both low-risk and high-risk individuals: (a) repeat chest CT, (b) follow-up PET-CT, or (c) tissue sampling. This recommendation follows the consensus statement: Guidelines for Management of Incidental Pulmonary Nodules Detected on CT Images: From the Fleischner Society 2017; Radiology 2017; 284:228-243. Electronically Signed   By: Virgina Norfolk M.D.   On: 05/12/2021 23:26   CT ABDOMEN PELVIS W CONTRAST  Result Date: 05/12/2021 CLINICAL DATA:  Abdominal pain and fevers EXAM: CT ABDOMEN AND PELVIS WITH CONTRAST TECHNIQUE: Multidetector CT imaging of the abdomen and pelvis was performed using the standard protocol following bolus administration of intravenous contrast. CONTRAST:  123mL OMNIPAQUE IOHEXOL 350 MG/ML SOLN COMPARISON:  04/03/2021 FINDINGS: Lower chest: No acute abnormality. Hepatobiliary: Gallbladder is partially distended. Significant Peri cholecystic inflammatory changes are noted with similar findings in the adjacent hepatic tissue. These changes have increased in the interval from the prior exam and again are highly suspicious for acute cholecystitis. Prominence of the common bile duct is again seen and stable. Pancreas: Pancreas appears within normal limits. Spleen: Normal  in size without focal abnormality. Adrenals/Urinary Tract: Adrenal glands are within normal limits. Kidneys again demonstrate a enhancing mass lesion within the substance of the right kidney measuring up to 3.3 cm suspicious for renal cell carcinoma and stable in appearance from the prior exam. Urologic consultation is recommended. No obstructive changes are seen. Stable renal cysts are noted in the right kidney. The bladder is decompressed. Stomach/Bowel: Diverticular change of the colon is noted without evidence of diverticulitis. The appendix is barium filled and within normal limits. Small bowel and stomach are unremarkable. Vascular/Lymphatic: Atherosclerotic calcifications of the aorta are noted. Normal tapering is seen. No aneurysm is noted. Some upper abdominal peripancreatic lymph nodes are seen which appears centrally necrotic. The overall appearance is stable from the prior study. Additional node located between the head of the pancreas and the left lobe of the liver is noted as well also stable in appearance. Persistent soft tissue density is noted  surrounding proximal superior vena cava. Reproductive: Prostate appears within normal limits. Other: No abdominal wall hernia or abnormality. No abdominopelvic ascites. Musculoskeletal: Degenerative changes of lumbar spine are noted. IMPRESSION: Increase in the inflammatory changes surrounding the gallbladder and extending into the adjacent liver again suspicious for acute cholecystitis with localized hepatic inflammatory change. Correlate with pending ultrasound exam Upper abdominal lymphadenopathy which appears necrotic and stable in appearance from the prior exam. Patient is scheduled for PET-CT according to outpatient clinic notes. Stable enhancing right renal mass suspicious for renal cell carcinoma. By history the patient has met with urology. Remainder of the exam is stable from the prior study. Electronically Signed   By: Inez Catalina M.D.   On: 05/12/2021 23:34   MR 3D Recon At Scanner  Result Date: 05/13/2021 CLINICAL DATA:  Abdominal pain. Biliary obstruction suspected. Rigors. Nausea and vomiting. Diarrhea for 2 weeks. Weight loss. EXAM: MRI ABDOMEN WITHOUT AND WITH CONTRAST (INCLUDING MRCP) TECHNIQUE: Multiplanar multisequence MR imaging of the abdomen was performed both before and after the administration of intravenous contrast. Heavily T2-weighted images of the biliary and pancreatic ducts were obtained, and three-dimensional MRCP images were rendered by post processing. CONTRAST:  7.54mL GADAVIST GADOBUTROL 1 MMOL/ML IV SOLN COMPARISON:  Ultrasound and CT of 1 day prior. FINDINGS: Portions of exam are mild to moderately motion degraded. Lower chest: New right base airspace disease since yesterday's CT. Mild cardiomegaly, without pericardial or pleural effusion. Hepatobiliary: Dependent small gallstones. The gallbladder is contracted with apparent mild gallbladder wall thickening. Within the pericholecystic right liver lobe, a 3.5 x 3.2 cm centrally hypoenhancing and mildly peripherally  hyperenhancing lesion is seen, including on 71/1104. Mildly T2 hypointense with mildly restricted diffusion. New since 04/03/2021. Mild intrahepatic biliary duct dilatation. The common duct is chronically dilated at 11 mm on 22/2. Followed to just above the level of the ampulla. On some images, including the dedicated MRCP sequence, a distal common duct filling defect is possible. Example at 6 mm 85/6. Pancreas: Pancreatic atrophy, without duct dilatation. Nonspecific edema within the left anterior pararenal space including on 18/4. Spleen:  Normal in size, without focal abnormality. Adrenals/Urinary Tract: Normal adrenal glands. Bilateral renal cysts. Arterially hyperenhancing interpolar right renal 2.8 x 2.6 cm lesion on 90/1101. No hydronephrosis. Stomach/Bowel: Grossly normal stomach and abdominal bowel loops. Vascular/Lymphatic:  Aortic atherosclerosis.  Patent renal veins. Again identified is necrotic adenopathy within the upper abdomen. Example gastrohepatic ligament node of 1.7 cm on 65/1102. A portacaval 11 mm necrotic node on 74/1103 Other:  No ascites. Musculoskeletal: No acute osseous  abnormality. IMPRESSION: 1. Motion degraded images. 2. Cholelithiasis. Suggestion of wall thickening in the setting of underdistention. Development of a pericholecystic liver lesion which is suspicious for extension of chronic cholecystitis. Direct extension of gallbladder carcinoma felt less likely, given absence of dominant gallbladder mass. 3. Upper abdominal necrotic adenopathy, present back to 04/03/2021. Outpatient PET may be informative to direct sampling. 4. Mild intra and extrahepatic biliary duct dilatation. Equivocal findings for distal common duct stone, given above limitations. If bilirubin is elevated, consider ERCP. 5. Interpolar right renal enhancing mass, consistent with renal cell carcinoma. 6. Mild edema within the anterior pararenal space. Correlate with pancreatic enzyme levels to exclude mild  pancreatitis. 7. Right base airspace disease, new since yesterday's CT. Favor atelectasis. Electronically Signed   By: Abigail Miyamoto M.D.   On: 05/13/2021 13:31   MR ABDOMEN MRCP W WO CONTAST  Result Date: 05/13/2021 CLINICAL DATA:  Abdominal pain. Biliary obstruction suspected. Rigors. Nausea and vomiting. Diarrhea for 2 weeks. Weight loss. EXAM: MRI ABDOMEN WITHOUT AND WITH CONTRAST (INCLUDING MRCP) TECHNIQUE: Multiplanar multisequence MR imaging of the abdomen was performed both before and after the administration of intravenous contrast. Heavily T2-weighted images of the biliary and pancreatic ducts were obtained, and three-dimensional MRCP images were rendered by post processing. CONTRAST:  7.4mL GADAVIST GADOBUTROL 1 MMOL/ML IV SOLN COMPARISON:  Ultrasound and CT of 1 day prior. FINDINGS: Portions of exam are mild to moderately motion degraded. Lower chest: New right base airspace disease since yesterday's CT. Mild cardiomegaly, without pericardial or pleural effusion. Hepatobiliary: Dependent small gallstones. The gallbladder is contracted with apparent mild gallbladder wall thickening. Within the pericholecystic right liver lobe, a 3.5 x 3.2 cm centrally hypoenhancing and mildly peripherally hyperenhancing lesion is seen, including on 71/1104. Mildly T2 hypointense with mildly restricted diffusion. New since 04/03/2021. Mild intrahepatic biliary duct dilatation. The common duct is chronically dilated at 11 mm on 22/2. Followed to just above the level of the ampulla. On some images, including the dedicated MRCP sequence, a distal common duct filling defect is possible. Example at 6 mm 85/6. Pancreas: Pancreatic atrophy, without duct dilatation. Nonspecific edema within the left anterior pararenal space including on 18/4. Spleen:  Normal in size, without focal abnormality. Adrenals/Urinary Tract: Normal adrenal glands. Bilateral renal cysts. Arterially hyperenhancing interpolar right renal 2.8 x 2.6 cm  lesion on 90/1101. No hydronephrosis. Stomach/Bowel: Grossly normal stomach and abdominal bowel loops. Vascular/Lymphatic:  Aortic atherosclerosis.  Patent renal veins. Again identified is necrotic adenopathy within the upper abdomen. Example gastrohepatic ligament node of 1.7 cm on 65/1102. A portacaval 11 mm necrotic node on 74/1103 Other:  No ascites. Musculoskeletal: No acute osseous abnormality. IMPRESSION: 1. Motion degraded images. 2. Cholelithiasis. Suggestion of wall thickening in the setting of underdistention. Development of a pericholecystic liver lesion which is suspicious for extension of chronic cholecystitis. Direct extension of gallbladder carcinoma felt less likely, given absence of dominant gallbladder mass. 3. Upper abdominal necrotic adenopathy, present back to 04/03/2021. Outpatient PET may be informative to direct sampling. 4. Mild intra and extrahepatic biliary duct dilatation. Equivocal findings for distal common duct stone, given above limitations. If bilirubin is elevated, consider ERCP. 5. Interpolar right renal enhancing mass, consistent with renal cell carcinoma. 6. Mild edema within the anterior pararenal space. Correlate with pancreatic enzyme levels to exclude mild pancreatitis. 7. Right base airspace disease, new since yesterday's CT. Favor atelectasis. Electronically Signed   By: Abigail Miyamoto M.D.   On: 05/13/2021 13:31   VAS [place] LOWER EXTREMITY VENOUS (  DVT)  Result Date: 05/13/2021  Lower Venous DVT Study Patient Name:  [facility]  Date of Exam:   05/13/2021 Medical Rec #: [id number]    Accession #:    [id number] Date of Birth: [birth year]    Patient Gender: M Patient Age:   19 years Exam Location:  [facility] Procedure:      VAS [place] LOWER EXTREMITY VENOUS (DVT) Referring Phys: ASIA ZIERLE-GHOSH --------------------------------------------------------------------------------  Indications: Pulmonary embolism.  Comparison Study: No previous exams Performing Technologist:  Jody Hill RVT, RDMS  Examination Guidelines: A complete evaluation includes B-mode imaging, spectral Doppler, color Doppler, and power Doppler as needed of all accessible portions of each vessel. Bilateral testing is considered an integral part of a complete examination. Limited examinations for reoccurring indications may be performed as noted. The reflux portion of the exam is performed with the patient in reverse Trendelenburg.  +---------+---------------+---------+-----------+----------+--------------+ RIGHT    CompressibilityPhasicitySpontaneityPropertiesThrombus Aging +---------+---------------+---------+-----------+----------+--------------+ CFV      Full           Yes      Yes                                 +---------+---------------+---------+-----------+----------+--------------+ SFJ      Full                                                        +---------+---------------+---------+-----------+----------+--------------+ FV Prox  Full           Yes      Yes                                 +---------+---------------+---------+-----------+----------+--------------+ FV Mid   Full           Yes      Yes                                 +---------+---------------+---------+-----------+----------+--------------+ FV DistalFull           Yes      Yes                                 +---------+---------------+---------+-----------+----------+--------------+ PFV      Full                                                        +---------+---------------+---------+-----------+----------+--------------+ POP      Full           Yes      Yes                                 +---------+---------------+---------+-----------+----------+--------------+ PTV      Full                                                        +---------+---------------+---------+-----------+----------+--------------+  PERO     Full                                                         +---------+---------------+---------+-----------+----------+--------------+   +---------+---------------+---------+-----------+----------+--------------+ LEFT     CompressibilityPhasicitySpontaneityPropertiesThrombus Aging +---------+---------------+---------+-----------+----------+--------------+ CFV      Full           Yes      Yes                                 +---------+---------------+---------+-----------+----------+--------------+ SFJ      Full                                                        +---------+---------------+---------+-----------+----------+--------------+ FV Prox  Full           Yes      Yes                                 +---------+---------------+---------+-----------+----------+--------------+ FV Mid   Full           Yes      Yes                                 +---------+---------------+---------+-----------+----------+--------------+ FV DistalFull           Yes      Yes                                 +---------+---------------+---------+-----------+----------+--------------+ PFV      Full                                                        +---------+---------------+---------+-----------+----------+--------------+ POP      Full           Yes      Yes                                 +---------+---------------+---------+-----------+----------+--------------+ PTV      Full                                                        +---------+---------------+---------+-----------+----------+--------------+ PERO     Full                                                        +---------+---------------+---------+-----------+----------+--------------+  Summary: RIGHT: - There is no evidence of deep vein thrombosis in the lower extremity.  LEFT: - There is no evidence of deep vein thrombosis in the lower extremity.  *See table(s) above for measurements and observations. Electronically signed by Deitra Mayo  MD on 05/13/2021 at 11:58:47 AM.    Final    US Abdomen Limited RUQ (LIVER/GB)  Result Date: 05/12/2021 CLINICAL DATA:  Fevers EXAM: ULTRASOUND ABDOMEN LIMITED RIGHT UPPER QUADRANT COMPARISON:  CT from earlier in the same day FINDINGS: Gallbladder: Gallbladder again shows evidence of cholelithiasis with gallbladder wall thickening and mild pericholecystic fluid. Negative sonographic Murphy's sign is elicited however. The degree of inflammatory change in the adjacent liver is less well appreciated on this exam than on recent CT. Common bile duct: Diameter: 6.4 mm Liver: No focal lesion identified. Within normal limits in parenchymal echogenicity. Portal vein is patent on color Doppler imaging with normal direction of blood flow towards the liver. Other: None. IMPRESSION: Stable changes in the gallbladder with cholelithiasis and gallbladder wall thickening. Negative sonographic Murphy's sign is elicited however. This may be in part due to underlying medications. Electronically Signed   By: Inez Catalina M.D.   On: 05/12/2021 23:36        Scheduled Meds:  aspirin EC  81 mg Oral Daily   atorvastatin  20 mg Oral Daily   donepezil  10 mg Oral QHS   memantine  10 mg Oral BID   Continuous Infusions:  heparin 1,200 Units/hr (05/13/21 0036)   lactated ringers Stopped (05/13/21 0624)   lactated ringers 75 mL/hr at 05/13/21 1038   norepinephrine (LEVOPHED) Adult infusion Stopped (05/12/21 2232)   piperacillin-tazobactam Stopped (05/13/21 1036)     LOS: 0 days      Phillips Climes, MD [facility]   To contact the attending provider between 7A-7P or the covering provider during after hours 7P-7A, please log into the web site [email or web address] and access using universal [place] password for that web site. If you do not have the password, please call the hospital operator.  05/13/2021, 2:05 PM

## 2021-05-13 NOTE — Progress Notes (Signed)
BLE venous duplex has been completed.   Results can be found under chart review under CV PROC. 05/13/2021 10:09 AM Dorette Hartel RVT, RDMS

## 2021-05-13 NOTE — ED Notes (Signed)
ED TO INPATIENT HANDOFF REPORT  ED Nurse Name and Phone #: Thurmond Butts, [phone]  S Name/Age/Gender Bari Mantis 80 y.o. male Room/Bed: 034C/034C  Code Status   Code Status: Full Code  Home/SNF/Other Home Patient oriented to: self, place, time, and situation Is this baseline? Yes   Triage Complete: Triage complete  Chief Complaint Sepsis [facility]) [A41.9]  Triage Note Patient [facility]IB [facility] from [facility] with complaint of sudden onset of tremor that started at approximately 1745 and stopped suddenly prior to arriving to ED. Patient alert, oriented, and his only complaint is that he feels cold.    Allergies No Known Allergies  Level of Care/Admitting Diagnosis ED Disposition     ED Disposition  Admit   Condition  --   [place]: [employer] [[id number]]  Level of Care: Progressive [102]  Admit to Progressive based on following criteria: RESPIRATORY PROBLEMS hypoxemic/hypercapnic respiratory failure that is responsive to NIPPV (BiPAP) or High Flow Nasal Cannula (6-80 lpm). Frequent assessment/intervention, no > Q2 hrs < Q4 hrs, to maintain oxygenation and pulmonary hygiene.  May admit patient to [facility] or [facility] if equivalent level of care is available:: No  Covid Evaluation: Asymptomatic Screening Protocol (No Symptoms)  Diagnosis: Sepsis [facility]) [[id number]]  Admitting Physician: Rolla Plate [[id number]]  Attending Physician: Rolla Plate [[id number]]  Estimated length of stay: past midnight tomorrow  Certification:: I certify this patient will need inpatient services for at least 2 midnights          B Medical/Surgery History Past Medical History:  Diagnosis Date   Arthritis    Coronary artery disease, non-occlusive    a. 06/2011: cath for abnormal exercise echo: Only 30-40% proximal RCA. Otherwise normal. b. 10/2015: cath for abnormal NST and atypical CP --> showed mild 30-40% stenosis in the prox-distal RCA   GERD (gastroesophageal reflux  disease)    HA (headache)    Hypertension    Memory loss    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[place])    TIA (transient ischemic attack)    Weakness    Past Surgical History:  Procedure Laterality Date   CARDIAC CATHETERIZATION  January 2013   Proximal RCA 30-40%. Otherwise normal.   CARDIAC CATHETERIZATION N/A 10/18/2015   Procedure: Left Heart Cath and Coronary Angiography;  Surgeon: Jettie Booze, MD;  Location: [facility];  Service: Cardiovascular;  Laterality: N/A;   NM [facility]  March 2015   LOW RISK. No scar or ischemia. Normal EF (55%). No RWMA   surgical excision of rectal cancer       A IV Location/Drains/Wounds Patient Lines/Drains/Airways Status     Active Line/Drains/Airways     Name Placement date Placement time Site Days   Peripheral IV 05/12/21 20 G 1.16" Anterior;Right Forearm 05/12/21  2047  Forearm  1   Peripheral IV 05/12/21 18 G 1.16" Anterior;Left Forearm 05/12/21  2146  Forearm  1            Intake/Output Last 24 hours No intake or output data in the 24 hours ending 05/13/21 1648  Labs/Imaging Results for orders placed or performed during the hospital encounter of 05/12/21 (from the past 48 hour(s))  Urinalysis, Routine w reflex microscopic Urine, Clean Catch     Status: Abnormal   Collection Time: 05/12/21  7:27 PM  Result Value Ref Range   Color, Urine YELLOW YELLOW   APPearance CLEAR CLEAR   Specific Gravity, Urine 1.020 1.005 - 1.030   pH 6.0 5.0 -  8.0   Glucose, UA NEGATIVE NEGATIVE mg/dL   Hgb urine dipstick NEGATIVE NEGATIVE   Bilirubin Urine SMALL (A) NEGATIVE   Ketones, ur 15 (A) NEGATIVE mg/dL   Protein, ur NEGATIVE NEGATIVE mg/dL   Nitrite NEGATIVE NEGATIVE   Leukocytes,Ua NEGATIVE NEGATIVE    Comment: Microscopic not done on urines with negative protein, blood, leukocytes, nitrite, or glucose < 500 mg/dL. Performed at [facility], [place] [address] [place], [place] [address]   CBC with Differential     Status: None    Collection Time: 05/12/21  7:48 PM  Result Value Ref Range   WBC 8.9 4.0 - 10.5 K/uL   RBC 5.60 4.22 - 5.81 MIL/uL   Hemoglobin 16.7 13.0 - 17.0 g/dL   HCT 51.5 39.0 - 52.0 %   MCV 92.0 80.0 - 100.0 fL   MCH 29.8 26.0 - 34.0 pg   MCHC 32.4 30.0 - 36.0 g/dL   RDW 15.1 11.5 - 15.5 %   Platelets 225 150 - 400 K/uL   nRBC 0.0 0.0 - 0.2 %   Neutrophils Relative % 85 %   Neutro Abs 7.6 1.7 - 7.7 K/uL   Lymphocytes Relative 13 %   Lymphs Abs 1.1 0.7 - 4.0 K/uL   Monocytes Relative 1 %   Monocytes Absolute 0.1 0.1 - 1.0 K/uL   Eosinophils Relative 1 %   Eosinophils Absolute 0.1 0.0 - 0.5 K/uL   Basophils Relative 0 %   Basophils Absolute 0.0 0.0 - 0.1 K/uL   Immature Granulocytes 0 %   Abs Immature Granulocytes 0.02 0.00 - 0.07 K/uL    Comment: Performed at [facility], [address] [place], [place] [address]  Comprehensive metabolic panel     Status: Abnormal   Collection Time: 05/12/21  7:48 PM  Result Value Ref Range   Sodium 136 135 - 145 mmol/L   Potassium 4.3 3.5 - 5.1 mmol/L   Chloride 96 (L) 98 - 111 mmol/L   CO2 30 22 - 32 mmol/L   Glucose, Bld 111 (H) 70 - 99 mg/dL    Comment: Glucose reference range applies only to samples taken after fasting for at least 8 hours.   BUN 10 8 - 23 mg/dL   Creatinine, Ser 0.94 0.61 - 1.24 mg/dL   Calcium 10.1 8.9 - 10.3 mg/dL   Total Protein 8.1 6.5 - 8.1 g/dL   Albumin 4.1 3.5 - 5.0 g/dL   AST 26 15 - 41 U/L   ALT 20 0 - 44 U/L   Alkaline Phosphatase 116 38 - 126 U/L   Total Bilirubin 3.5 (H) 0.3 - 1.2 mg/dL   GFR, Estimated >60 >60 mL/min    Comment: (NOTE) Calculated using the CKD-EPI Creatinine Equation (2021)    Anion gap 10 5 - 15    Comment: Performed at [facility] [address], [place], [place] [address]  Troponin I (High Sensitivity)     Status: None   Collection Time: 05/12/21  8:26 PM  Result Value Ref Range   Troponin I (High Sensitivity) 11 <18 ng/L    Comment: (NOTE) Elevated high sensitivity  troponin I (hsTnI) values and significant  changes across serial measurements may suggest ACS but many other  chronic and acute conditions are known to elevate hsTnI results.  Refer to the "Links" section for chest pain algorithms and additional  guidance. Performed at [facility], [place] [address], [place], [place] [address]  Resp Panel by RT-PCR (Flu A&B, Covid) Nasopharyngeal Swab     Status: None   Collection Time: 05/12/21  9:14 PM   Specimen: Nasopharyngeal Swab; Nasopharyngeal(NP) swabs in vial transport medium  Result Value Ref Range   SARS Coronavirus 2 by RT PCR NEGATIVE NEGATIVE    Comment: (NOTE) SARS-CoV-2 target nucleic acids are NOT DETECTED.  The SARS-CoV-2 RNA is generally detectable in upper respiratory specimens during the acute phase of infection. The lowest concentration of SARS-CoV-2 viral copies this assay can detect is 138 copies/mL. A negative result does not preclude SARS-Cov-2 infection and should not be used as the sole basis for treatment or other patient management decisions. A negative result may occur with  improper specimen collection/handling, submission of specimen other than nasopharyngeal swab, presence of viral mutation(s) within the areas targeted by this assay, and inadequate number of viral copies(<138 copies/mL). A negative result must be combined with clinical observations, patient history, and epidemiological information. The expected result is Negative.  Fact Sheet for Patients:  EntrepreneurPulse.com.au  Fact Sheet for Healthcare Providers:  IncredibleEmployment.be  This test is no t yet approved or cleared by the [place] FDA and  has been authorized for detection and/or diagnosis of SARS-CoV-2 by FDA under an Emergency Use Authorization (EUA). This EUA will remain  in effect (meaning this test can be used) for the duration of the COVID-19 declaration under Section 564(b)(1) of the Act,  21 U.S.C.section 360bbb-3(b)(1), unless the authorization is terminated  or revoked sooner.       Influenza A by PCR NEGATIVE NEGATIVE   Influenza B by PCR NEGATIVE NEGATIVE    Comment: (NOTE) The Xpert Xpress SARS-CoV-2/FLU/RSV plus assay is intended as an aid in the diagnosis of influenza from Nasopharyngeal swab specimens and should not be used as a sole basis for treatment. Nasal washings and aspirates are unacceptable for Xpert Xpress SARS-CoV-2/FLU/RSV testing.  Fact Sheet for Patients: EntrepreneurPulse.com.au  Fact Sheet for Healthcare Providers: IncredibleEmployment.be  This test is not yet approved or cleared by the [place] FDA and has been authorized for detection and/or diagnosis of SARS-CoV-2 by FDA under an Emergency Use Authorization (EUA). This EUA will remain in effect (meaning this test can be used) for the duration of the COVID-19 declaration under Section 564(b)(1) of the Act, 21 U.S.C. section 360bbb-3(b)(1), unless the authorization is terminated or revoked.  Performed at [facility], [place] [address], [place], [place] [address]   Lactic acid, plasma     Status: None   Collection Time: 05/12/21  9:14 PM  Result Value Ref Range   Lactic Acid, Venous 1.9 0.5 - 1.9 mmol/L    Comment: Performed at [facility] [address], [place], [place] [address]  Blood Culture (routine x 2)     Status: Abnormal (Preliminary result)   Collection Time: 05/12/21  9:19 PM   Specimen: BLOOD  Result Value Ref Range   Specimen Description BLOOD SITE NOT SPECIFIED    Special Requests      BOTTLES DRAWN AEROBIC AND ANAEROBIC Blood Culture adequate volume   Culture  Setup Time (A)     GRAM VARIABLE ROD AEROBIC BOTTLE ONLY CRITICAL RESULT CALLED TO, READ BACK BY AND VERIFIED WITH: PHARMD E MARTIN [id number] AT 1447 BY CM Performed at [facility], [place] [address], [place], [place] [address]    Culture GRAM VARIABLE  ROD (A)    Report Status PENDING   Blood Culture ID Panel (Reflexed)  Status: Abnormal   Collection Time: 05/12/21  9:19 PM  Result Value Ref Range   Enterococcus faecalis NOT DETECTED NOT DETECTED   Enterococcus Faecium NOT DETECTED NOT DETECTED   Listeria monocytogenes NOT DETECTED NOT DETECTED   Staphylococcus species NOT DETECTED NOT DETECTED   Staphylococcus aureus (BCID) NOT DETECTED NOT DETECTED   Staphylococcus epidermidis NOT DETECTED NOT DETECTED   Staphylococcus lugdunensis NOT DETECTED NOT DETECTED   Streptococcus species NOT DETECTED NOT DETECTED   Streptococcus agalactiae NOT DETECTED NOT DETECTED   Streptococcus pneumoniae NOT DETECTED NOT DETECTED   Streptococcus pyogenes NOT DETECTED NOT DETECTED   A.calcoaceticus-baumannii NOT DETECTED NOT DETECTED   Bacteroides fragilis NOT DETECTED NOT DETECTED   Enterobacterales DETECTED (A) NOT DETECTED    Comment: Enterobacterales represent a large order of gram negative bacteria, not a single organism. CRITICAL RESULT CALLED TO, READ BACK BY AND VERIFIED WITH: PHARMD E MARTIN [id number] AR 1457 BY CM    Enterobacter cloacae complex NOT DETECTED NOT DETECTED   Escherichia coli DETECTED (A) NOT DETECTED    Comment: CRITICAL RESULT CALLED TO, READ BACK BY AND VERIFIED WITH: PHARMD E MARTIN [id number] AT 1457 BY CM    Klebsiella aerogenes NOT DETECTED NOT DETECTED   Klebsiella oxytoca NOT DETECTED NOT DETECTED   Klebsiella pneumoniae NOT DETECTED NOT DETECTED   Proteus species NOT DETECTED NOT DETECTED   Salmonella species NOT DETECTED NOT DETECTED   Serratia marcescens NOT DETECTED NOT DETECTED   Haemophilus influenzae NOT DETECTED NOT DETECTED   Neisseria meningitidis NOT DETECTED NOT DETECTED   Pseudomonas aeruginosa NOT DETECTED NOT DETECTED   Stenotrophomonas maltophilia NOT DETECTED NOT DETECTED   Candida albicans NOT DETECTED NOT DETECTED   Candida auris NOT DETECTED NOT DETECTED   Candida glabrata NOT DETECTED NOT  DETECTED   Candida krusei NOT DETECTED NOT DETECTED   Candida parapsilosis NOT DETECTED NOT DETECTED   Candida tropicalis NOT DETECTED NOT DETECTED   Cryptococcus neoformans/gattii NOT DETECTED NOT DETECTED   CTX-M ESBL NOT DETECTED NOT DETECTED   Carbapenem resistance IMP NOT DETECTED NOT DETECTED   Carbapenem resistance KPC NOT DETECTED NOT DETECTED   Carbapenem resistance NDM NOT DETECTED NOT DETECTED   Carbapenem resist OXA 48 LIKE NOT DETECTED NOT DETECTED   Carbapenem resistance VIM NOT DETECTED NOT DETECTED    Comment: Performed at [facility], [address], [place], [place] [address]  Protime-INR     Status: None   Collection Time: 05/12/21  9:37 PM  Result Value Ref Range   Prothrombin Time 14.7 11.4 - 15.2 seconds   INR 1.1 0.8 - 1.2    Comment: (NOTE) INR goal varies based on device and disease states. Performed at [facility], [place] [address], [place], [place] [address]   APTT     Status: None   Collection Time: 05/12/21  9:37 PM  Result Value Ref Range   aPTT 31 24 - 36 seconds    Comment: Performed at [facility] [address] [place], [place] [address]  Blood Culture (routine x 2)     Status: None (Preliminary result)   Collection Time: 05/12/21  9:37 PM   Specimen: BLOOD  Result Value Ref Range   Specimen Description BLOOD SITE NOT SPECIFIED    Special Requests      BOTTLES DRAWN AEROBIC AND ANAEROBIC Blood Culture adequate volume   Culture      NO GROWTH < 12 HOURS Performed at [facility], [address], [place], [place] [address]    Report Status PENDING   Lactic acid, plasma     Status: Abnormal   Collection Time: 05/13/21 12:14 AM  Result Value Ref Range   Lactic Acid, Venous 2.4 (HH) 0.5 - 1.9 mmol/L    Comment: CRITICAL RESULT CALLED TO, READ BACK BY AND VERIFIED WITH: J. SINES, RN 05/13/21 0110 A GASKINS Performed at [facility], [place] [address] [place], [place] [address]   Rapid urine drug screen (hospital  performed)     Status: None   Collection Time: 05/13/21  5:08 AM  Result Value Ref Range   Opiates NONE DETECTED NONE DETECTED   Cocaine NONE DETECTED NONE DETECTED   Benzodiazepines NONE DETECTED NONE DETECTED   Amphetamines NONE DETECTED NONE DETECTED   Tetrahydrocannabinol NONE DETECTED NONE DETECTED   Barbiturates NONE DETECTED NONE DETECTED    Comment: (NOTE) DRUG SCREEN FOR MEDICAL PURPOSES ONLY.  IF CONFIRMATION IS NEEDED FOR ANY PURPOSE, NOTIFY LAB WITHIN 5 DAYS.  LOWEST DETECTABLE LIMITS FOR URINE DRUG SCREEN Drug Class                     Cutoff (ng/mL) Amphetamine and metabolites    1000 Barbiturate and metabolites    200 Benzodiazepine                 209 Tricyclics and metabolites     300 Opiates and metabolites        300 Cocaine and metabolites        300 THC                            50 Performed at [facility], [place] [address] [place], [place] [address]   Protime-INR     Status: Abnormal   Collection Time: 05/13/21  5:08 AM  Result Value Ref Range   Prothrombin Time 16.0 (H) 11.4 - 15.2 seconds   INR 1.3 (H) 0.8 - 1.2    Comment: (NOTE) INR goal varies based on device and disease states. Performed at [facility], [place] [address] [place], [place] [address]   Cortisol-am, blood     Status: Abnormal   Collection Time: 05/13/21  5:08 AM  Result Value Ref Range   Cortisol - AM >100.0 (H) 6.7 - 22.6 ug/dL    Comment: RESULTS CONFIRMED BY MANUAL DILUTION Performed at [facility], [place] [address], [place], [place] [address]   Comprehensive metabolic panel     Status: Abnormal   Collection Time: 05/13/21  5:08 AM  Result Value Ref Range   Sodium 131 (L) 135 - 145 mmol/L   Potassium 5.2 (H) 3.5 - 5.1 mmol/L    Comment: HEMOLYSIS AT THIS LEVEL MAY AFFECT RESULT   Chloride 97 (L) 98 - 111 mmol/L   CO2 27 22 - 32 mmol/L   Glucose, Bld 96 70 - 99 mg/dL    Comment: Glucose reference range applies only to samples taken after fasting for at  least 8 hours.   BUN 10 8 - 23 mg/dL   Creatinine, Ser 0.78 0.61 - 1.24 mg/dL   Calcium 8.7 (L) 8.9 - 10.3 mg/dL   Total Protein 6.1 (L) 6.5 - 8.1 g/dL   Albumin 3.0 (L) 3.5 - 5.0 g/dL   AST 44 (H) 15 - 41 U/L   ALT 22 0 - 44 U/L   Alkaline Phosphatase 94 38 -  126 U/L   Total Bilirubin 3.5 (H) 0.3 - 1.2 mg/dL   GFR, Estimated >60 >60 mL/min    Comment: (NOTE) Calculated using the CKD-EPI Creatinine Equation (2021)    Anion gap 7 5 - 15    Comment: Performed at [facility] [address], [place], [place] [address]  Magnesium     Status: None   Collection Time: 05/13/21  5:08 AM  Result Value Ref Range   Magnesium 1.7 1.7 - 2.4 mg/dL    Comment: Performed at [facility] [address] [place], [place] [address]  CBC WITH DIFFERENTIAL     Status: Abnormal   Collection Time: 05/13/21  5:08 AM  Result Value Ref Range   WBC 10.5 4.0 - 10.5 K/uL   RBC 4.73 4.22 - 5.81 MIL/uL   Hemoglobin 14.1 13.0 - 17.0 g/dL   HCT 42.5 39.0 - 52.0 %   MCV 89.9 80.0 - 100.0 fL   MCH 29.8 26.0 - 34.0 pg   MCHC 33.2 30.0 - 36.0 g/dL   RDW 15.0 11.5 - 15.5 %   Platelets 171 150 - 400 K/uL   nRBC 0.0 0.0 - 0.2 %   Neutrophils Relative % 91 %   Neutro Abs 9.6 (H) 1.7 - 7.7 K/uL   Lymphocytes Relative 6 %   Lymphs Abs 0.6 (L) 0.7 - 4.0 K/uL   Monocytes Relative 3 %   Monocytes Absolute 0.3 0.1 - 1.0 K/uL   Eosinophils Relative 0 %   Eosinophils Absolute 0.0 0.0 - 0.5 K/uL   Basophils Relative 0 %   Basophils Absolute 0.0 0.0 - 0.1 K/uL   Immature Granulocytes 0 %   Abs Immature Granulocytes 0.04 0.00 - 0.07 K/uL    Comment: Performed at [facility] [address], [place], [place] [address]  TSH     Status: None   Collection Time: 05/13/21  5:08 AM  Result Value Ref Range   TSH 0.433 0.350 - 4.500 uIU/mL    Comment: Performed by a 3rd Generation assay with a functional sensitivity of <=0.01 uIU/mL. Performed at [facility], [place] [address], [place], [place]  [address]   Lactic acid, plasma     Status: None   Collection Time: 05/13/21  5:08 AM  Result Value Ref Range   Lactic Acid, Venous 1.9 0.5 - 1.9 mmol/L    Comment: Performed at [facility] [address] [place], [place] [address]  Potassium     Status: None   Collection Time: 05/13/21  6:32 AM  Result Value Ref Range   Potassium 3.6 3.5 - 5.1 mmol/L    Comment: Performed at [facility] [address], [place], [place] [address]  Procalcitonin     Status: None   Collection Time: 05/13/21  6:32 AM  Result Value Ref Range   Procalcitonin 12.23 ng/mL    Comment:        Interpretation: PCT >= 10 ng/mL: Important systemic inflammatory response, almost exclusively due to severe bacterial sepsis or septic shock. (NOTE)       Sepsis PCT Algorithm           Lower Respiratory Tract                                      Infection PCT Algorithm    ----------------------------     ----------------------------  PCT < 0.25 ng/mL                PCT < 0.10 ng/mL          Strongly encourage             Strongly discourage   discontinuation of antibiotics    initiation of antibiotics    ----------------------------     -----------------------------       PCT 0.25 - 0.50 ng/mL            PCT 0.10 - 0.25 ng/mL               OR       >80% decrease in PCT            Discourage initiation of                                            antibiotics      Encourage discontinuation           of antibiotics    ----------------------------     -----------------------------         PCT >= 0.50 ng/mL              PCT 0.26 - 0.50 ng/mL                AND       <80% decrease in PCT             Encourage initiation of                                             antibiotics       Encourage continuation           of antibiotics    ----------------------------     -----------------------------        PCT >= 0.50 ng/mL                  PCT > 0.50 ng/mL               AND         increase in  PCT                  Strongly encourage                                      initiation of antibiotics    Strongly encourage escalation           of antibiotics                                     -----------------------------                                           PCT <= 0.25 ng/mL  OR                                        > 80% decrease in PCT                                      Discontinue / Do not initiate                                             antibiotics  Performed at [facility], [place] [address], [place], [place] [address]   Heparin level (unfractionated)     Status: None   Collection Time: 05/13/21  7:50 AM  Result Value Ref Range   Heparin Unfractionated 0.33 0.30 - 0.70 IU/mL    Comment: (NOTE) The clinical reportable range upper limit is being lowered to >1.10 to align with the FDA approved guidance for the current laboratory assay.  If heparin results are below expected values, and patient dosage has  been confirmed, suggest follow up testing of antithrombin III levels. Performed at [facility], [place] [address], [place], [place] [address]   Troponin I (High Sensitivity)     Status: None   Collection Time: 05/13/21  7:50 AM  Result Value Ref Range   Troponin I (High Sensitivity) 17 <18 ng/L    Comment: (NOTE) Elevated high sensitivity troponin I (hsTnI) values and significant  changes across serial measurements may suggest ACS but many other  chronic and acute conditions are known to elevate hsTnI results.  Refer to the "Links" section for chest pain algorithms and additional  guidance. Performed at [facility], [place] [address], [place], [place] [address]   Lactic acid, plasma     Status: None   Collection Time: 05/13/21  7:50 AM  Result Value Ref Range   Lactic Acid, Venous 1.2 0.5 - 1.9 mmol/L    Comment: Performed at [facility] [address], [place],  [address]    DG Chest 1 View  Result Date: 05/12/2021 CLINICAL DATA:  The technologist did not provide a history. EXAM: CHEST  1 VIEW COMPARISON:  PA Lat 04/13/2021 FINDINGS: The heart size and mediastinal contours are within normal limits apart from mild aortic tortuosity. Both lungs are clear. The visualized skeletal structures are unremarkable aside from spinal bridging enthesopathy of DISH. IMPRESSION: No evidence of acute chest disease or interval changes. Electronically Signed   By: Telford Nab M.D.   On: 05/12/2021 21:29   CT Angio Chest Pulmonary Embolism (PE) W or WO Contrast  Result Date: 05/12/2021 CLINICAL DATA:  Tremors and sepsis. EXAM: CT ANGIOGRAPHY CHEST WITH CONTRAST TECHNIQUE: Multidetector CT imaging of the chest was performed using the standard protocol during bolus administration of intravenous contrast. Multiplanar CT image reconstructions and MIPs were obtained to evaluate the vascular anatomy. CONTRAST:  18m OMNIPAQUE IOHEXOL 350 MG/ML SOLN COMPARISON:  Oct 20, 2015 FINDINGS: Cardiovascular: A small amount of intraluminal low attenuation is seen within a subsegmental lower lobe branch of the left pulmonary artery (axial CT images 151 through 158, CT series 5/coronal reformatted images 88 through 91, CT series 8). Normal heart size. No pericardial effusion. Mediastinum/Nodes: No enlarged  mediastinal, hilar, or axillary lymph nodes. Thyroid gland, trachea, and esophagus demonstrate no significant findings. Lungs/Pleura: Mild atelectatic changes are seen within the posterolateral aspect of the left upper lobe. A 1.1 cm noncalcified lung nodule is seen within the posterior aspect of the left upper lobe (axial CT image 75, CT series 4). This represents a new finding when compared to the prior exam. There is no evidence of a pleural effusion or pneumothorax. Upper Abdomen: No acute abnormality. Musculoskeletal: Multilevel degenerative changes seen throughout the thoracic spine. Review of the MIP  images confirms the above findings. IMPRESSION: 1. Small amount of pulmonary embolism within a subsegmental lower lobe branch of the left pulmonary artery. 2. 1.1 cm noncalcified lung nodule within the posterior aspect of the left upper lobe. This represents a new finding when compared to the prior exam. Consider one of the following in 3 months for both low-risk and high-risk individuals: (a) repeat chest CT, (b) follow-up PET-CT, or (c) tissue sampling. This recommendation follows the consensus statement: Guidelines for Management of Incidental Pulmonary Nodules Detected on CT Images: From the Fleischner Society 2017; Radiology 2017; 284:228-243. Electronically Signed   By: Virgina Norfolk M.D.   On: 05/12/2021 23:26   CT ABDOMEN PELVIS W CONTRAST  Result Date: 05/12/2021 CLINICAL DATA:  Abdominal pain and fevers EXAM: CT ABDOMEN AND PELVIS WITH CONTRAST TECHNIQUE: Multidetector CT imaging of the abdomen and pelvis was performed using the standard protocol following bolus administration of intravenous contrast. CONTRAST:  136m OMNIPAQUE IOHEXOL 350 MG/ML SOLN COMPARISON:  04/03/2021 FINDINGS: Lower chest: No acute abnormality. Hepatobiliary: Gallbladder is partially distended. Significant Peri cholecystic inflammatory changes are noted with similar findings in the adjacent hepatic tissue. These changes have increased in the interval from the prior exam and again are highly suspicious for acute cholecystitis. Prominence of the common bile duct is again seen and stable. Pancreas: Pancreas appears within normal limits. Spleen: Normal in size without focal abnormality. Adrenals/Urinary Tract: Adrenal glands are within normal limits. Kidneys again demonstrate a enhancing mass lesion within the substance of the right kidney measuring up to 3.3 cm suspicious for renal cell carcinoma and stable in appearance from the prior exam. Urologic consultation is recommended. No obstructive changes are seen. Stable renal  cysts are noted in the right kidney. The bladder is decompressed. Stomach/Bowel: Diverticular change of the colon is noted without evidence of diverticulitis. The appendix is barium filled and within normal limits. Small bowel and stomach are unremarkable. Vascular/Lymphatic: Atherosclerotic calcifications of the aorta are noted. Normal tapering is seen. No aneurysm is noted. Some upper abdominal peripancreatic lymph nodes are seen which appears centrally necrotic. The overall appearance is stable from the prior study. Additional node located between the head of the pancreas and the left lobe of the liver is noted as well also stable in appearance. Persistent soft tissue density is noted surrounding proximal superior vena cava. Reproductive: Prostate appears within normal limits. Other: No abdominal wall hernia or abnormality. No abdominopelvic ascites. Musculoskeletal: Degenerative changes of lumbar spine are noted. IMPRESSION: Increase in the inflammatory changes surrounding the gallbladder and extending into the adjacent liver again suspicious for acute cholecystitis with localized hepatic inflammatory change. Correlate with pending ultrasound exam Upper abdominal lymphadenopathy which appears necrotic and stable in appearance from the prior exam. Patient is scheduled for PET-CT according to outpatient clinic notes. Stable enhancing right renal mass suspicious for renal cell carcinoma. By history the patient has met with urology. Remainder of the exam is stable from the prior  study. Electronically Signed   By: Inez Catalina M.D.   On: 05/12/2021 23:34   MR 3D Recon At Scanner  Result Date: 05/13/2021 CLINICAL DATA:  Abdominal pain. Biliary obstruction suspected. Rigors. Nausea and vomiting. Diarrhea for 2 weeks. Weight loss. EXAM: MRI ABDOMEN WITHOUT AND WITH CONTRAST (INCLUDING MRCP) TECHNIQUE: Multiplanar multisequence MR imaging of the abdomen was performed both before and after the administration of  intravenous contrast. Heavily T2-weighted images of the biliary and pancreatic ducts were obtained, and three-dimensional MRCP images were rendered by post processing. CONTRAST:  7.31m GADAVIST GADOBUTROL 1 MMOL/ML IV SOLN COMPARISON:  Ultrasound and CT of 1 day prior. FINDINGS: Portions of exam are mild to moderately motion degraded. Lower chest: New right base airspace disease since yesterday's CT. Mild cardiomegaly, without pericardial or pleural effusion. Hepatobiliary: Dependent small gallstones. The gallbladder is contracted with apparent mild gallbladder wall thickening. Within the pericholecystic right liver lobe, a 3.5 x 3.2 cm centrally hypoenhancing and mildly peripherally hyperenhancing lesion is seen, including on 71/1104. Mildly T2 hypointense with mildly restricted diffusion. New since 04/03/2021. Mild intrahepatic biliary duct dilatation. The common duct is chronically dilated at 11 mm on 22/2. Followed to just above the level of the ampulla. On some images, including the dedicated MRCP sequence, a distal common duct filling defect is possible. Example at 6 mm 85/6. Pancreas: Pancreatic atrophy, without duct dilatation. Nonspecific edema within the left anterior pararenal space including on 18/4. Spleen:  Normal in size, without focal abnormality. Adrenals/Urinary Tract: Normal adrenal glands. Bilateral renal cysts. Arterially hyperenhancing interpolar right renal 2.8 x 2.6 cm lesion on 90/1101. No hydronephrosis. Stomach/Bowel: Grossly normal stomach and abdominal bowel loops. Vascular/Lymphatic:  Aortic atherosclerosis.  Patent renal veins. Again identified is necrotic adenopathy within the upper abdomen. Example gastrohepatic ligament node of 1.7 cm on 65/1102. A portacaval 11 mm necrotic node on 74/1103 Other:  No ascites. Musculoskeletal: No acute osseous abnormality. IMPRESSION: 1. Motion degraded images. 2. Cholelithiasis. Suggestion of wall thickening in the setting of underdistention.  Development of a pericholecystic liver lesion which is suspicious for extension of chronic cholecystitis. Direct extension of gallbladder carcinoma felt less likely, given absence of dominant gallbladder mass. 3. Upper abdominal necrotic adenopathy, present back to 04/03/2021. Outpatient PET may be informative to direct sampling. 4. Mild intra and extrahepatic biliary duct dilatation. Equivocal findings for distal common duct stone, given above limitations. If bilirubin is elevated, consider ERCP. 5. Interpolar right renal enhancing mass, consistent with renal cell carcinoma. 6. Mild edema within the anterior pararenal space. Correlate with pancreatic enzyme levels to exclude mild pancreatitis. 7. Right base airspace disease, new since yesterday's CT. Favor atelectasis. Electronically Signed   By: KAbigail MiyamotoM.D.   On: 05/13/2021 13:31   MR ABDOMEN MRCP W WO CONTAST  Result Date: 05/13/2021 CLINICAL DATA:  Abdominal pain. Biliary obstruction suspected. Rigors. Nausea and vomiting. Diarrhea for 2 weeks. Weight loss. EXAM: MRI ABDOMEN WITHOUT AND WITH CONTRAST (INCLUDING MRCP) TECHNIQUE: Multiplanar multisequence MR imaging of the abdomen was performed both before and after the administration of intravenous contrast. Heavily T2-weighted images of the biliary and pancreatic ducts were obtained, and three-dimensional MRCP images were rendered by post processing. CONTRAST:  7.520mGADAVIST GADOBUTROL 1 MMOL/ML IV SOLN COMPARISON:  Ultrasound and CT of 1 day prior. FINDINGS: Portions of exam are mild to moderately motion degraded. Lower chest: New right base airspace disease since yesterday's CT. Mild cardiomegaly, without pericardial or pleural effusion. Hepatobiliary: Dependent small gallstones. The gallbladder is contracted with  apparent mild gallbladder wall thickening. Within the pericholecystic right liver lobe, a 3.5 x 3.2 cm centrally hypoenhancing and mildly peripherally hyperenhancing lesion is seen,  including on 71/1104. Mildly T2 hypointense with mildly restricted diffusion. New since 04/03/2021. Mild intrahepatic biliary duct dilatation. The common duct is chronically dilated at 11 mm on 22/2. Followed to just above the level of the ampulla. On some images, including the dedicated MRCP sequence, a distal common duct filling defect is possible. Example at 6 mm 85/6. Pancreas: Pancreatic atrophy, without duct dilatation. Nonspecific edema within the left anterior pararenal space including on 18/4. Spleen:  Normal in size, without focal abnormality. Adrenals/Urinary Tract: Normal adrenal glands. Bilateral renal cysts. Arterially hyperenhancing interpolar right renal 2.8 x 2.6 cm lesion on 90/1101. No hydronephrosis. Stomach/Bowel: Grossly normal stomach and abdominal bowel loops. Vascular/Lymphatic:  Aortic atherosclerosis.  Patent renal veins. Again identified is necrotic adenopathy within the upper abdomen. Example gastrohepatic ligament node of 1.7 cm on 65/1102. A portacaval 11 mm necrotic node on 74/1103 Other:  No ascites. Musculoskeletal: No acute osseous abnormality. IMPRESSION: 1. Motion degraded images. 2. Cholelithiasis. Suggestion of wall thickening in the setting of underdistention. Development of a pericholecystic liver lesion which is suspicious for extension of chronic cholecystitis. Direct extension of gallbladder carcinoma felt less likely, given absence of dominant gallbladder mass. 3. Upper abdominal necrotic adenopathy, present back to 04/03/2021. Outpatient PET may be informative to direct sampling. 4. Mild intra and extrahepatic biliary duct dilatation. Equivocal findings for distal common duct stone, given above limitations. If bilirubin is elevated, consider ERCP. 5. Interpolar right renal enhancing mass, consistent with renal cell carcinoma. 6. Mild edema within the anterior pararenal space. Correlate with pancreatic enzyme levels to exclude mild pancreatitis. 7. Right base airspace  disease, new since yesterday's CT. Favor atelectasis. Electronically Signed   By: Abigail Miyamoto M.D.   On: 05/13/2021 13:31   ECHOCARDIOGRAM COMPLETE  Result Date: 05/13/2021    ECHOCARDIOGRAM REPORT   Patient Name:   [facility] Date of Exam: 05/13/2021 Medical Rec #:  [id number]   Height:       71.0 in Accession #:    [id number]  Weight:       165.0 lb Date of Birth:  03-24-1941   BSA:          1.943 m Patient Age:    29 years    BP:           101/70 mmHg Patient Gender: M           HR:           74 bpm. Exam Location:  Inpatient Procedure: 2D Echo, Cardiac Doppler, Color Doppler and Intracardiac            Opacification Agent Indications:    Pulmonary emblous  History:        Patient has no prior history of Echocardiogram examinations.                 CAD, COPD; Risk Factors:Hypertension.  Sonographer:    Glo Herring Referring Phys: [id number] [place]  1. Left ventricular ejection fraction, by estimation, is 40 to 45%. The left ventricle has mildly decreased function. The left ventricle demonstrates global hypokinesis. Left ventricular diastolic parameters are indeterminate.  2. Right ventricular systolic function is normal. The right ventricular size is mildly enlarged. Tricuspid regurgitation signal is inadequate for assessing PA pressure.  3. Right atrial size was mildly dilated.  4. The mitral valve is normal  in structure. Trivial mitral valve regurgitation. No evidence of mitral stenosis.  5. The aortic valve is tricuspid. Aortic valve regurgitation is not visualized. No aortic stenosis is present.  6. The inferior vena cava is dilated in size with >50% respiratory variability, suggesting right atrial pressure of 8 mmHg. FINDINGS  Left Ventricle: Left ventricular ejection fraction, by estimation, is 40 to 45%. The left ventricle has mildly decreased function. The left ventricle demonstrates global hypokinesis. The left ventricular internal cavity size was normal in size. There is   no left ventricular hypertrophy. Left ventricular diastolic parameters are indeterminate. Right Ventricle: The right ventricular size is mildly enlarged. No increase in right ventricular wall thickness. Right ventricular systolic function is normal. Tricuspid regurgitation signal is inadequate for assessing PA pressure. Left Atrium: Left atrial size was normal in size. Right Atrium: Right atrial size was mildly dilated. Pericardium: There is no evidence of pericardial effusion. Mitral Valve: The mitral valve is normal in structure. Trivial mitral valve regurgitation. No evidence of mitral valve stenosis. Tricuspid Valve: The tricuspid valve is normal in structure. Tricuspid valve regurgitation is not demonstrated. Aortic Valve: The aortic valve is tricuspid. Aortic valve regurgitation is not visualized. No aortic stenosis is present. Aortic valve mean gradient measures 1.0 mmHg. Aortic valve peak gradient measures 2.7 mmHg. Aortic valve area, by VTI measures 3.05 cm. Pulmonic Valve: The pulmonic valve was not well visualized. Pulmonic valve regurgitation is not visualized. Aorta: The aortic root is normal in size and structure. Venous: The inferior vena cava is dilated in size with greater than 50% respiratory variability, suggesting right atrial pressure of 8 mmHg. IAS/Shunts: The interatrial septum was not well visualized.  LEFT VENTRICLE PLAX 2D LVIDd:         4.70 cm      Diastology LVIDs:         3.00 cm      LV e' medial:    7.18 cm/s LV PW:         0.90 cm      LV E/e' medial:  8.4 LV IVS:        0.90 cm      LV e' lateral:   8.92 cm/s LVOT diam:     2.10 cm      LV E/e' lateral: 6.8 LV SV:         52 LV SV Index:   27 LVOT Area:     3.46 cm  LV Volumes (MOD) LV vol d, MOD A2C: 117.0 ml LV vol d, MOD A4C: 129.0 ml LV vol s, MOD A2C: 73.4 ml LV vol s, MOD A4C: 81.0 ml LV SV MOD A2C:     43.6 ml LV SV MOD A4C:     129.0 ml LV SV MOD BP:      44.8 ml RIGHT VENTRICLE             IVC RV Basal diam:  4.20 cm      IVC diam: 2.40 cm RV S prime:     11.90 cm/s LEFT ATRIUM           Index        RIGHT ATRIUM           Index LA diam:      2.60 cm 1.34 cm/m   RA Area:     23.40 cm LA Vol (A2C): 35.8 ml 18.42 ml/m  RA Volume:   74.30 ml  38.24 ml/m LA Vol (A4C): 58.1 ml 29.90 ml/m  AORTIC VALVE AV Area (Vmax):    3.12 cm AV Area (Vmean):   3.01 cm AV Area (VTI):     3.05 cm AV Vmax:           81.90 cm/s AV Vmean:          55.200 cm/s AV VTI:            0.169 m AV Peak Grad:      2.7 mmHg AV Mean Grad:      1.0 mmHg LVOT Vmax:         73.80 cm/s LVOT Vmean:        47.900 cm/s LVOT VTI:          0.149 m LVOT/AV VTI ratio: 0.88  AORTA Ao Root diam: 3.40 cm MITRAL VALVE MV Area (PHT): 3.83 cm    SHUNTS MV Decel Time: 198 msec    Systemic VTI:  0.15 m MV E velocity: 60.40 cm/s  Systemic Diam: 2.10 cm MV A velocity: 75.00 cm/s MV E/A ratio:  0.81 Oswaldo Milian MD Electronically signed by Oswaldo Milian MD Signature Date/Time: 05/13/2021/4:24:59 PM    Final    VAS [place] LOWER EXTREMITY VENOUS (DVT)  Result Date: 05/13/2021  Lower Venous DVT Study Patient Name:  [facility]  Date of Exam:   05/13/2021 Medical Rec #: [id number]    Accession #:    [id number] Date of Birth: 09-13-1940    Patient Gender: M Patient Age:   17 years Exam Location:  [facility] Procedure:      VAS [place] LOWER EXTREMITY VENOUS (DVT) Referring Phys: ASIA ZIERLE-GHOSH --------------------------------------------------------------------------------  Indications: Pulmonary embolism.  Comparison Study: No previous exams Performing Technologist: Jody Hill RVT, RDMS  Examination Guidelines: A complete evaluation includes B-mode imaging, spectral Doppler, color Doppler, and power Doppler as needed of all accessible portions of each vessel. Bilateral testing is considered an integral part of a complete examination. Limited examinations for reoccurring indications may be performed as noted. The reflux portion of the exam is performed with the patient  in reverse Trendelenburg.  +---------+---------------+---------+-----------+----------+--------------+ RIGHT    CompressibilityPhasicitySpontaneityPropertiesThrombus Aging +---------+---------------+---------+-----------+----------+--------------+ CFV      Full           Yes      Yes                                 +---------+---------------+---------+-----------+----------+--------------+ SFJ      Full                                                        +---------+---------------+---------+-----------+----------+--------------+ FV Prox  Full           Yes      Yes                                 +---------+---------------+---------+-----------+----------+--------------+ FV Mid   Full           Yes      Yes                                 +---------+---------------+---------+-----------+----------+--------------+ FV DistalFull  Yes      Yes                                 +---------+---------------+---------+-----------+----------+--------------+ PFV      Full                                                        +---------+---------------+---------+-----------+----------+--------------+ POP      Full           Yes      Yes                                 +---------+---------------+---------+-----------+----------+--------------+ PTV      Full                                                        +---------+---------------+---------+-----------+----------+--------------+ PERO     Full                                                        +---------+---------------+---------+-----------+----------+--------------+   +---------+---------------+---------+-----------+----------+--------------+ LEFT     CompressibilityPhasicitySpontaneityPropertiesThrombus Aging +---------+---------------+---------+-----------+----------+--------------+ CFV      Full           Yes      Yes                                  +---------+---------------+---------+-----------+----------+--------------+ SFJ      Full                                                        +---------+---------------+---------+-----------+----------+--------------+ FV Prox  Full           Yes      Yes                                 +---------+---------------+---------+-----------+----------+--------------+ FV Mid   Full           Yes      Yes                                 +---------+---------------+---------+-----------+----------+--------------+ FV DistalFull           Yes      Yes                                 +---------+---------------+---------+-----------+----------+--------------+ PFV      Full                                                        +---------+---------------+---------+-----------+----------+--------------+  POP      Full           Yes      Yes                                 +---------+---------------+---------+-----------+----------+--------------+ PTV      Full                                                        +---------+---------------+---------+-----------+----------+--------------+ PERO     Full                                                        +---------+---------------+---------+-----------+----------+--------------+     Summary: RIGHT: - There is no evidence of deep vein thrombosis in the lower extremity.  LEFT: - There is no evidence of deep vein thrombosis in the lower extremity.  *See table(s) above for measurements and observations. Electronically signed by Deitra Mayo MD on 05/13/2021 at 11:58:47 AM.    Final    US Abdomen Limited RUQ (LIVER/GB)  Result Date: 05/12/2021 CLINICAL DATA:  Fevers EXAM: ULTRASOUND ABDOMEN LIMITED RIGHT UPPER QUADRANT COMPARISON:  CT from earlier in the same day FINDINGS: Gallbladder: Gallbladder again shows evidence of cholelithiasis with gallbladder wall thickening and mild pericholecystic fluid. Negative  sonographic Murphy's sign is elicited however. The degree of inflammatory change in the adjacent liver is less well appreciated on this exam than on recent CT. Common bile duct: Diameter: 6.4 mm Liver: No focal lesion identified. Within normal limits in parenchymal echogenicity. Portal vein is patent on color Doppler imaging with normal direction of blood flow towards the liver. Other: None. IMPRESSION: Stable changes in the gallbladder with cholelithiasis and gallbladder wall thickening. Negative sonographic Murphy's sign is elicited however. This may be in part due to underlying medications. Electronically Signed   By: Inez Catalina M.D.   On: 05/12/2021 23:36    Pending Labs Unresulted Labs (From admission, onward)     Start     Ordered   05/14/21 0500  Heparin level (unfractionated)  Daily,   R      05/13/21 0025   05/14/21 0500  CBC  Daily,   R      05/13/21 0025   05/14/21 0500  Comprehensive metabolic panel  Tomorrow morning,   R        05/13/21 1430   05/13/21 1600  Heparin level (unfractionated)  Once-Timed,   TIMED        05/13/21 0905   05/13/21 0306  Urine Culture  Once,   R       Question:  Indication  Answer:  Sepsis   05/13/21 0305   05/12/21 2114  Urine Culture  (Septic presentation on arrival (screening labs, nursing and treatment orders for obvious sepsis))  ONCE - STAT,   STAT       Question:  Indication  Answer:  Sepsis   05/12/21 2114            Vitals/Pain Today's Vitals   05/13/21 0900 05/13/21 1100 05/13/21 1315 05/13/21 1620  BP: 101/70 117/73 124/69 123/62  Pulse:  76 80 79  Resp: 16 18 (!) 28 (!) 21  Temp:      TempSrc:      SpO2:  100% 96% 97%  PainSc:        Isolation Precautions No active isolations  Medications Medications  heparin ADULT infusion 100 units/mL (25000 units/273m) (1,200 Units/hr Intravenous New Bag/Given 05/13/21 0036)  aspirin EC tablet 81 mg (81 mg Oral Given 05/13/21 1329)  atorvastatin (LIPITOR) tablet 20 mg (20 mg Oral  Given 05/13/21 1328)  donepezil (ARICEPT) tablet 10 mg (has no administration in time range)  memantine (NAMENDA) tablet 10 mg (10 mg Oral Given 05/13/21 1329)  tiZANidine (ZANAFLEX) tablet 2 mg (has no administration in time range)  piperacillin-tazobactam (ZOSYN) IVPB 3.375 g (3.375 g Intravenous New Bag/Given 05/13/21 1508)  acetaminophen (TYLENOL) tablet 650 mg (has no administration in time range)    Or  acetaminophen (TYLENOL) suppository 650 mg (has no administration in time range)  oxyCODONE (Oxy IR/ROXICODONE) immediate release tablet 5 mg (has no administration in time range)  ondansetron (ZOFRAN) tablet 4 mg (has no administration in time range)    Or  ondansetron (ZOFRAN) injection 4 mg (has no administration in time range)  lactated ringers infusion ( Intravenous New Bag/Given 05/13/21 1038)  perflutren lipid microspheres (DEFINITY) IV suspension (2 mLs Intravenous Given 05/13/21 1427)  lactated ringers bolus 1,000 mL (0 mLs Intravenous Stopped 05/12/21 2233)    And  lactated ringers bolus 1,000 mL (0 mLs Intravenous Stopped 05/13/21 0030)    And  lactated ringers bolus 250 mL (0 mLs Intravenous Stopped 05/12/21 2234)  piperacillin-tazobactam (ZOSYN) IVPB 3.375 g (0 g Intravenous Stopped 05/12/21 2234)  HYDROmorphone (DILAUDID) injection 1 mg (1 mg Intravenous Given 05/12/21 2155)  iohexol (OMNIPAQUE) 350 MG/ML injection 100 mL (100 mLs Intravenous Contrast Given 05/12/21 2313)  heparin bolus via infusion 4,000 Units (4,000 Units Intravenous Bolus from Bag 05/13/21 0036)  lactated ringers bolus 500 mL (0 mLs Intravenous Stopped 05/13/21 0305)  gadobutrol (GADAVIST) 1 MMOL/ML injection 7.5 mL (7.5 mLs Intravenous Contrast Given 05/13/21 1307)    Mobility walks with device Low fall risk   Focused Assessments Cardiac Assessment Handoff:    Lab Results  Component Value Date   CKTOTAL 141 07/15/2020   CKMB 3.4 05/15/2011   TROPONINI <0.03 06/21/2018   Lab Results  Component Value  Date   DDIMER 0.50 12/28/2016   Does the Patient currently have chest pain? No    R Recommendations: See Admitting Provider Note  Report given to:   Additional Notes:

## 2021-05-13 NOTE — Progress Notes (Addendum)
ANTICOAGULATION CONSULT NOTE   Pharmacy Consult for Heparin Indication: pulmonary embolus  No Known Allergies  Patient Measurements:    Vital Signs: Temp: 99.2 F (37.3 C) (12/02 0230) Temp Source: Oral (12/02 0230) BP: 112/69 (12/02 0700) Pulse Rate: 80 (12/02 0700)  Labs: Recent Labs    05/12/21 1948 05/12/21 2026 05/12/21 2137 05/13/21 0508 05/13/21 0750  HGB 16.7  --   --  14.1  --   HCT 51.5  --   --  42.5  --   PLT 225  --   --  171  --   APTT  --   --  31  --   --   LABPROT  --   --  14.7 16.0*  --   INR  --   --  1.1 1.3*  --   HEPARINUNFRC  --   --   --   --  0.33  CREATININE 0.94  --   --  0.78  --   TROPONINIHS  --  11  --   --  17     CrCl cannot be calculated (Unknown ideal weight.).   Medical History: Past Medical History:  Diagnosis Date   Arthritis    Coronary artery disease, non-occlusive    a. 06/2011: cath for abnormal exercise echo: Only 30-40% proximal RCA. Otherwise normal. b. 10/2015: cath for abnormal NST and atypical CP --> showed mild 30-40% stenosis in the prox-distal RCA   GERD (gastroesophageal reflux disease)    HA (headache)    Hypertension    Memory loss    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HCC)    TIA (transient ischemic attack)    Weakness     Medications:  No current facility-administered medications on file prior to encounter.   Current Outpatient Medications on File Prior to Encounter  Medication Sig Dispense Refill   acetaminophen (TYLENOL) 500 MG tablet Take 500 mg by mouth every 6 (six) hours as needed.     amLODipine (NORVASC) 5 MG tablet Take 5 mg by mouth daily.     aspirin EC 81 MG tablet Take 1 tablet (81 mg total) by mouth daily.     atorvastatin (LIPITOR) 20 MG tablet Take 1 tablet (20 mg total) by mouth daily. 90 tablet 3   donepezil (ARICEPT) 10 MG tablet Take 1 tablet (10 mg total) by mouth at bedtime. 90 tablet 4   hydrochlorothiazide (HYDRODIURIL) 25 MG tablet TAKE 1 TABLET(25 MG) BY MOUTH DAILY 90 tablet 3   losartan  (COZAAR) 100 MG tablet Take 1 tablet (100 mg total) by mouth daily. 90 tablet 3   meloxicam (MOBIC) 15 MG tablet Take 1 tablet (15 mg total) by mouth daily as needed for pain. 90 tablet 1   memantine (NAMENDA) 10 MG tablet Take 1 tablet (10 mg total) by mouth 2 (two) times daily. 60 tablet 11   omeprazole (PRILOSEC) 40 MG capsule Take 1 capsule (40 mg total) by mouth daily. 30 capsule 5   oxycodone (OXY-IR) 5 MG capsule Take 1 capsule (5 mg total) by mouth every 4 (four) hours as needed. 10 capsule 0   sucralfate (CARAFATE) 1 g tablet Take 1 tablet (1 g total) by mouth 4 (four) times daily. 120 tablet 0   tizanidine (ZANAFLEX) 2 MG capsule Take 1 capsule (2 mg total) by mouth at bedtime as needed for muscle spasms. 30 capsule 5   triamcinolone (NASACORT) 55 MCG/ACT AERO nasal inhaler Place 2 sprays into the nose daily. 1 Inhaler 12  vitamin B-12 (CYANOCOBALAMIN) 1000 MCG tablet Take 1 tablet (1,000 mcg total) by mouth daily. 90 tablet 3   Vitamin D, Ergocalciferol, (DRISDOL) 1.25 MG (50000 UNIT) CAPS capsule TAKE 1 CAPSULE BY MOUTH EVERY 7 DAYS 10 capsule 0     Assessment: 80 y.o. male presenting with rigors and abdominal pain, also small PE on chest CT.  He is not on anticoagulation PTA  Initial heparin level therapeutic on 1200 units/hr  Goal of Therapy:  Heparin level 0.3-0.7 units/ml Monitor platelets by anticoagulation protocol: Yes   Plan:  Continue heparin gtt at 1200 units/hr F/u 8 hour heparin level to confirm F/u ability to transition to PO [facility]  Bertis Ruddy, PharmD Clinical Pharmacist ED Pharmacist Phone # [phone] 05/13/2021 9:04 AM  Addendum: Patient refusing labs. Will switch to lovenox per Dr. Bonney Aid. MD requesting that we hold AM lovenox dose day of surgery.  Enoxaparin 75mg  q12h F/u surgery plan and hold accordingly  Lorelei Pont, PharmD, BCPS 05/13/2021 5:38 PM ED Clinical Pharmacist -  [phone]

## 2021-05-13 NOTE — H&P (Addendum)
TRH H&P    Patient Demographics:    Joanne Brander, is a 80 y.o. male  MRN: [id number]  DOB - June 26, 1940  Admit Date - 05/12/2021  Referring MD/NP/PA: Rancour  Outpatient Primary MD for the patient is Biagio Borg, MD  Patient coming from: Home  Chief complaint- Rigors   HPI:    Artavis Cowie  is a 80 y.o. male, with history of GERD, coronary artery disease, hypertension, rectal cancer, TIA, weakness, presents ED with a chief complaint of rigors.  Patient reports rigors starting around 3 PM.  He initially denied pain associated with them, but then later reports abdominal pain.  He denies any measured fever at home.  Patient does report that has been nauseous.  He last vomited night before presentation.  He has had diarrhea for 2 weeks.  Patient reports he was diagnosed 2 weeks ago with cholelithiasis and was discussing having his gallbladder out.  Patient does report abdominal pain that is burning.  Symptoms lower abdomen.  Pepto-Bismol helped but did not relieve the pain completely.  He reports having a bowel movement resolves the pain briefly.  He has decreased appetite.  He has been losing weight.  Patient has been eating only soup.  He reports associated fatigue and malaise.  He has no associated dyspnea.  He does have a dry cough.  Patient has no other complaints at this time.  Patient quit drinking 4 weeks ago, smokes and does not use illicit drugs.  He is vaccinated for COVID.  Patient is full code.  In the ED Temp 98.2 but as high as 102.6, heart rate 41-129, respiratory rate 11-24, blood pressure 110/68 satting at 96% Leukocytosis 8.9, hemoglobin 16.7, platelets 225 Chemistry is unremarkable aside from obtain bili of 3.5 EKG shows a heart rate of 153, sinus tach, QTC 539, with ST depressions Initial troponin 11, repeat pending Lactic acid 2.5, repeat pending 1 mg Dilaudid, 2.25 L bolus, LR 150 mL/h  given Levophed ordered but not started as patient's blood pressure improved with fluids Zosyn started Blood cultures pending CT abdomen pelvis shows an increase in inflammatory changes surrounding the gallbladder and extending into the adjacent liver again suspicious for acute cholecystitis with localized hepatic inflammatory changes CTA PE shows a small amount of pulmonary embolism within the subsegmental lower lobe branch left pulmonary artery [place] [facility] surgery saw patient and recommends completing MRCP to further evaluate.      Review of systems:    In addition to the HPI above,  No Headache, No changes with Vision or hearing, No problems swallowing food or Liquids, No Chest pain, Cough or Shortness of Breath, No Blood in stool or Urine, No dysuria, No new skin rashes or bruises, No new joints pains-aches,  No new weakness, tingling, numbness in any extremity, No recent weight gain or loss, No polyuria, polydypsia or polyphagia, No significant Mental Stressors.  All other systems reviewed and are negative.    Past History of the following :    Past Medical History:  Diagnosis Date   Arthritis  Coronary artery disease, non-occlusive    a. 06/2011: cath for abnormal exercise echo: Only 30-40% proximal RCA. Otherwise normal. b. 10/2015: cath for abnormal NST and atypical CP --> showed mild 30-40% stenosis in the prox-distal RCA   GERD (gastroesophageal reflux disease)    HA (headache)    Hypertension    Memory loss    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[place])    TIA (transient ischemic attack)    Weakness       Past Surgical History:  Procedure Laterality Date   CARDIAC CATHETERIZATION  January 2013   Proximal RCA 30-40%. Otherwise normal.   CARDIAC CATHETERIZATION N/A 10/18/2015   Procedure: Left Heart Cath and Coronary Angiography;  Surgeon: Jettie Booze, MD;  Location: [facility];  Service: Cardiovascular;  Laterality: N/A;   NM MYOVIEW LTD  March 2015   LOW RISK. No  scar or ischemia. Normal EF (55%). No RWMA   surgical excision of rectal cancer        Social History:      Social History   Tobacco Use   Smoking status: Some Days    Packs/day: 0.25    Types: Cigarettes   Smokeless tobacco: Never  Substance Use Topics   Alcohol use: Yes    Comment: drinks "every now and then"       Family History :     Family History  Problem Relation Age of Onset   Hypertension Mother    Cancer Mother    Liver disease Father       Home Medications:   Prior to Admission medications   Medication Sig Start Date End Date Taking? Authorizing Provider  acetaminophen (TYLENOL) 500 MG tablet Take 500 mg by mouth every 6 (six) hours as needed.    [provider]  amLODipine (NORVASC) 5 MG tablet Take 5 mg by mouth daily. 08/27/20   [provider]  aspirin EC 81 MG tablet Take 1 tablet (81 mg total) by mouth daily. 03/24/19   Binnie Rail, MD  atorvastatin (LIPITOR) 20 MG tablet Take 1 tablet (20 mg total) by mouth daily. 08/07/19   Biagio Borg, MD  donepezil (ARICEPT) 10 MG tablet Take 1 tablet (10 mg total) by mouth at bedtime. 12/17/19   Marcial Pacas, MD  hydrochlorothiazide (HYDRODIURIL) 25 MG tablet TAKE 1 TABLET(25 MG) BY MOUTH DAILY 08/28/20   Biagio Borg, MD  losartan (COZAAR) 100 MG tablet Take 1 tablet (100 mg total) by mouth daily. 08/07/19   Biagio Borg, MD  meloxicam (MOBIC) 15 MG tablet Take 1 tablet (15 mg total) by mouth daily as needed for pain. 02/11/20   Biagio Borg, MD  memantine (NAMENDA) 10 MG tablet Take 1 tablet (10 mg total) by mouth 2 (two) times daily. 12/17/19   Marcial Pacas, MD  omeprazole (PRILOSEC) 40 MG capsule Take 1 capsule (40 mg total) by mouth daily. 09/10/19   Binnie Rail, MD  oxycodone (OXY-IR) 5 MG capsule Take 1 capsule (5 mg total) by mouth every 4 (four) hours as needed. 04/04/21   Tacy Learn, PA-C  sucralfate (CARAFATE) 1 g tablet Take 1 tablet (1 g total) by mouth 4 (four) times daily.  03/28/21   Biagio Borg, MD  tizanidine (ZANAFLEX) 2 MG capsule Take 1 capsule (2 mg total) by mouth at bedtime as needed for muscle spasms. 08/07/19   Biagio Borg, MD  triamcinolone (NASACORT) 55 MCG/ACT AERO nasal inhaler Place 2 sprays into the nose  daily. 10/07/19   Biagio Borg, MD  vitamin B-12 (CYANOCOBALAMIN) 1000 MCG tablet Take 1 tablet (1,000 mcg total) by mouth daily. 12/01/19   Biagio Borg, MD  Vitamin D, Ergocalciferol, (DRISDOL) 1.25 MG (50000 UNIT) CAPS capsule TAKE 1 CAPSULE BY MOUTH EVERY 7 DAYS 12/15/20   Suzzanne Cloud, NP     Allergies:    No Known Allergies   Physical Exam:   Vitals  Blood pressure 108/67, pulse 95, temperature 99.2 F (37.3 C), temperature source Oral, resp. rate 19, SpO2 94 %.   1.  General: Patient lying supine in bed,  no acute distress   2. Psychiatric: Alert and oriented x 3, mood and behavior normal for situation, pleasant and cooperative with exam   3. Neurologic: Speech and language are normal, face is symmetric, moves all 4 extremities voluntarily, at baseline without acute deficits on limited exam   4. HEENMT:  Head is atraumatic, normocephalic, pupils reactive to light, neck is supple, trachea is midline, mucous membranes are moist   5. Respiratory : Lungs are clear to auscultation bilaterally without wheezing, rhonchi, rales, no cyanosis, no increase in work of breathing or accessory muscle use   6. Cardiovascular : Heart rate normal, rhythm is regular, no murmurs, rubs or gallops, no peripheral edema, peripheral pulses palpated   7. Gastrointestinal:  Abdomen is soft,  mildly distended and tender in the right upper and right lower quadrants bowel sounds active, no masses or organomegaly palpated   8. Skin:  Skin is warm, dry and intact without rashes, acute lesions, or ulcers on limited exam   9.Musculoskeletal:  No acute deformities or trauma, no asymmetry in tone, no peripheral edema, peripheral pulses palpated, no  tenderness to palpation in the extremities     Data Review:    CBC Recent Labs  Lab 05/12/21 1948 05/13/21 0508  WBC 8.9 10.5  HGB 16.7 14.1  HCT 51.5 42.5  PLT 225 171  MCV 92.0 89.9  MCH 29.8 29.8  MCHC 32.4 33.2  RDW 15.1 15.0  LYMPHSABS 1.1 0.6*  MONOABS 0.1 0.3  EOSABS 0.1 0.0  BASOSABS 0.0 0.0   ------------------------------------------------------------------------------------------------------------------  Results for orders placed or performed during the hospital encounter of 05/12/21 (from the past 48 hour(s))  Urinalysis, Routine w reflex microscopic Urine, Clean Catch     Status: Abnormal   Collection Time: 05/12/21  7:27 PM  Result Value Ref Range   Color, Urine YELLOW YELLOW   APPearance CLEAR CLEAR   Specific Gravity, Urine 1.020 1.005 - 1.030   pH 6.0 5.0 - 8.0   Glucose, UA NEGATIVE NEGATIVE mg/dL   Hgb urine dipstick NEGATIVE NEGATIVE   Bilirubin Urine SMALL (A) NEGATIVE   Ketones, ur 15 (A) NEGATIVE mg/dL   Protein, ur NEGATIVE NEGATIVE mg/dL   Nitrite NEGATIVE NEGATIVE   Leukocytes,Ua NEGATIVE NEGATIVE    Comment: Microscopic not done on urines with negative protein, blood, leukocytes, nitrite, or glucose < 500 mg/dL. Performed at [facility], [place] [address], [place], [place] [address]   CBC with Differential     Status: None   Collection Time: 05/12/21  7:48 PM  Result Value Ref Range   WBC 8.9 4.0 - 10.5 K/uL   RBC 5.60 4.22 - 5.81 MIL/uL   Hemoglobin 16.7 13.0 - 17.0 g/dL   HCT 51.5 39.0 - 52.0 %   MCV 92.0 80.0 - 100.0 fL   MCH 29.8 26.0 - 34.0 pg   MCHC 32.4 30.0 -  36.0 g/dL   RDW 15.1 11.5 - 15.5 %   Platelets 225 150 - 400 K/uL   nRBC 0.0 0.0 - 0.2 %   Neutrophils Relative % 85 %   Neutro Abs 7.6 1.7 - 7.7 K/uL   Lymphocytes Relative 13 %   Lymphs Abs 1.1 0.7 - 4.0 K/uL   Monocytes Relative 1 %   Monocytes Absolute 0.1 0.1 - 1.0 K/uL   Eosinophils Relative 1 %   Eosinophils Absolute 0.1 0.0 - 0.5 K/uL   Basophils  Relative 0 %   Basophils Absolute 0.0 0.0 - 0.1 K/uL   Immature Granulocytes 0 %   Abs Immature Granulocytes 0.02 0.00 - 0.07 K/uL    Comment: Performed at [facility] [address] [place], [place] [address]  Comprehensive metabolic panel     Status: Abnormal   Collection Time: 05/12/21  7:48 PM  Result Value Ref Range   Sodium 136 135 - 145 mmol/L   Potassium 4.3 3.5 - 5.1 mmol/L   Chloride 96 (L) 98 - 111 mmol/L   CO2 30 22 - 32 mmol/L   Glucose, Bld 111 (H) 70 - 99 mg/dL    Comment: Glucose reference range applies only to samples taken after fasting for at least 8 hours.   BUN 10 8 - 23 mg/dL   Creatinine, Ser 0.94 0.61 - 1.24 mg/dL   Calcium 10.1 8.9 - 10.3 mg/dL   Total Protein 8.1 6.5 - 8.1 g/dL   Albumin 4.1 3.5 - 5.0 g/dL   AST 26 15 - 41 U/L   ALT 20 0 - 44 U/L   Alkaline Phosphatase 116 38 - 126 U/L   Total Bilirubin 3.5 (H) 0.3 - 1.2 mg/dL   GFR, Estimated >60 >60 mL/min    Comment: (NOTE) Calculated using the CKD-EPI Creatinine Equation (2021)    Anion gap 10 5 - 15    Comment: Performed at [facility] [address], [place], [place] [address]  Troponin I (High Sensitivity)     Status: None   Collection Time: 05/12/21  8:26 PM  Result Value Ref Range   Troponin I (High Sensitivity) 11 <18 ng/L    Comment: (NOTE) Elevated high sensitivity troponin I (hsTnI) values and significant  changes across serial measurements may suggest ACS but many other  chronic and acute conditions are known to elevate hsTnI results.  Refer to the "Links" section for chest pain algorithms and additional  guidance. Performed at [facility], [place] [address], [place], [place] [address]   Resp Panel by RT-PCR (Flu A&B, Covid) Nasopharyngeal Swab     Status: None   Collection Time: 05/12/21  9:14 PM   Specimen: Nasopharyngeal Swab; Nasopharyngeal(NP) swabs in vial transport medium  Result Value Ref Range   SARS Coronavirus 2 by RT PCR NEGATIVE NEGATIVE     Comment: (NOTE) SARS-CoV-2 target nucleic acids are NOT DETECTED.  The SARS-CoV-2 RNA is generally detectable in upper respiratory specimens during the acute phase of infection. The lowest concentration of SARS-CoV-2 viral copies this assay can detect is 138 copies/mL. A negative result does not preclude SARS-Cov-2 infection and should not be used as the sole basis for treatment or other patient management decisions. A negative result may occur with  improper specimen collection/handling, submission of specimen other than nasopharyngeal swab, presence of viral mutation(s) within the areas targeted by this assay, and inadequate number of viral copies(<138 copies/mL). A negative result must be combined with  clinical observations, patient history, and epidemiological information. The expected result is Negative.  Fact Sheet for Patients:  EntrepreneurPulse.com.au  Fact Sheet for Healthcare Providers:  IncredibleEmployment.be  This test is no t yet approved or cleared by the [place] FDA and  has been authorized for detection and/or diagnosis of SARS-CoV-2 by FDA under an Emergency Use Authorization (EUA). This EUA will remain  in effect (meaning this test can be used) for the duration of the COVID-19 declaration under Section 564(b)(1) of the Act, 21 U.S.C.section 360bbb-3(b)(1), unless the authorization is terminated  or revoked sooner.       Influenza A by PCR NEGATIVE NEGATIVE   Influenza B by PCR NEGATIVE NEGATIVE    Comment: (NOTE) The Xpert Xpress SARS-CoV-2/FLU/RSV plus assay is intended as an aid in the diagnosis of influenza from Nasopharyngeal swab specimens and should not be used as a sole basis for treatment. Nasal washings and aspirates are unacceptable for Xpert Xpress SARS-CoV-2/FLU/RSV testing.  Fact Sheet for Patients: EntrepreneurPulse.com.au  Fact Sheet for Healthcare  Providers: IncredibleEmployment.be  This test is not yet approved or cleared by the [place] FDA and has been authorized for detection and/or diagnosis of SARS-CoV-2 by FDA under an Emergency Use Authorization (EUA). This EUA will remain in effect (meaning this test can be used) for the duration of the COVID-19 declaration under Section 564(b)(1) of the Act, 21 U.S.C. section 360bbb-3(b)(1), unless the authorization is terminated or revoked.  Performed at [facility], [place] [address] [place], [place] [address]   Lactic acid, plasma     Status: None   Collection Time: 05/12/21  9:14 PM  Result Value Ref Range   Lactic Acid, Venous 1.9 0.5 - 1.9 mmol/L    Comment: Performed at [facility] [address], [place], [place] [address]  Protime-INR     Status: None   Collection Time: 05/12/21  9:37 PM  Result Value Ref Range   Prothrombin Time 14.7 11.4 - 15.2 seconds   INR 1.1 0.8 - 1.2    Comment: (NOTE) INR goal varies based on device and disease states. Performed at [facility], [place] [address], [place], [place] [address]   APTT     Status: None   Collection Time: 05/12/21  9:37 PM  Result Value Ref Range   aPTT 31 24 - 36 seconds    Comment: Performed at [facility] [address], [place], [place] [address]  Lactic acid, plasma     Status: Abnormal   Collection Time: 05/13/21 12:14 AM  Result Value Ref Range   Lactic Acid, Venous 2.4 (HH) 0.5 - 1.9 mmol/L    Comment: CRITICAL RESULT CALLED TO, READ BACK BY AND VERIFIED WITH: J. SINES, RN 05/13/21 0110 A GASKINS Performed at [facility], [place] [address] [place], [place] [address]   Rapid urine drug screen (hospital performed)     Status: None   Collection Time: 05/13/21  5:08 AM  Result Value Ref Range   Opiates NONE DETECTED NONE DETECTED   Cocaine NONE DETECTED NONE DETECTED   Benzodiazepines NONE DETECTED NONE DETECTED   Amphetamines NONE DETECTED NONE  DETECTED   Tetrahydrocannabinol NONE DETECTED NONE DETECTED   Barbiturates NONE DETECTED NONE DETECTED    Comment: (NOTE) DRUG SCREEN FOR MEDICAL PURPOSES ONLY.  IF CONFIRMATION IS NEEDED FOR ANY PURPOSE, NOTIFY LAB WITHIN 5 DAYS.  LOWEST DETECTABLE LIMITS FOR URINE DRUG SCREEN Drug Class  Cutoff (ng/mL) Amphetamine and metabolites    1000 Barbiturate and metabolites    200 Benzodiazepine                 353 Tricyclics and metabolites     300 Opiates and metabolites        300 Cocaine and metabolites        300 THC                            50 Performed at [facility], [place] [address] [place], [place] [address]   Protime-INR     Status: Abnormal   Collection Time: 05/13/21  5:08 AM  Result Value Ref Range   Prothrombin Time 16.0 (H) 11.4 - 15.2 seconds   INR 1.3 (H) 0.8 - 1.2    Comment: (NOTE) INR goal varies based on device and disease states. Performed at [facility], [place] [address] [place], [place] [address]   Comprehensive metabolic panel     Status: Abnormal   Collection Time: 05/13/21  5:08 AM  Result Value Ref Range   Sodium 131 (L) 135 - 145 mmol/L   Potassium 5.2 (H) 3.5 - 5.1 mmol/L    Comment: HEMOLYSIS AT THIS LEVEL MAY AFFECT RESULT   Chloride 97 (L) 98 - 111 mmol/L   CO2 27 22 - 32 mmol/L   Glucose, Bld 96 70 - 99 mg/dL    Comment: Glucose reference range applies only to samples taken after fasting for at least 8 hours.   BUN 10 8 - 23 mg/dL   Creatinine, Ser 0.78 0.61 - 1.24 mg/dL   Calcium 8.7 (L) 8.9 - 10.3 mg/dL   Total Protein 6.1 (L) 6.5 - 8.1 g/dL   Albumin 3.0 (L) 3.5 - 5.0 g/dL   AST 44 (H) 15 - 41 U/L   ALT 22 0 - 44 U/L   Alkaline Phosphatase 94 38 - 126 U/L   Total Bilirubin 3.5 (H) 0.3 - 1.2 mg/dL   GFR, Estimated >60 >60 mL/min    Comment: (NOTE) Calculated using the CKD-EPI Creatinine Equation (2021)    Anion gap 7 5 - 15    Comment: Performed at [facility], [place] [address],  [place], [place] [address]  Magnesium     Status: None   Collection Time: 05/13/21  5:08 AM  Result Value Ref Range   Magnesium 1.7 1.7 - 2.4 mg/dL    Comment: Performed at [facility] [address] [place] [address], [place], [place] [address]  CBC WITH DIFFERENTIAL     Status: Abnormal   Collection Time: 05/13/21  5:08 AM  Result Value Ref Range   WBC 10.5 4.0 - 10.5 K/uL   RBC 4.73 4.22 - 5.81 MIL/uL   Hemoglobin 14.1 13.0 - 17.0 g/dL   HCT 42.5 39.0 - 52.0 %   MCV 89.9 80.0 - 100.0 fL   MCH 29.8 26.0 - 34.0 pg   MCHC 33.2 30.0 - 36.0 g/dL   RDW 15.0 11.5 - 15.5 %   Platelets 171 150 - 400 K/uL   nRBC 0.0 0.0 - 0.2 %   Neutrophils Relative % 91 %   Neutro Abs 9.6 (H) 1.7 - 7.7 K/uL   Lymphocytes Relative 6 %   Lymphs Abs 0.6 (L) 0.7 - 4.0 K/uL   Monocytes Relative 3 %   Monocytes Absolute 0.3 0.1 - 1.0 K/uL   Eosinophils Relative 0 %  Eosinophils Absolute 0.0 0.0 - 0.5 K/uL   Basophils Relative 0 %   Basophils Absolute 0.0 0.0 - 0.1 K/uL   Immature Granulocytes 0 %   Abs Immature Granulocytes 0.04 0.00 - 0.07 K/uL    Comment: Performed at [facility], [place] [address] [place], [place] [address]  TSH     Status: None   Collection Time: 05/13/21  5:08 AM  Result Value Ref Range   TSH 0.433 0.350 - 4.500 uIU/mL    Comment: Performed by a 3rd Generation assay with a functional sensitivity of <=0.01 uIU/mL. Performed at [facility], [place] [address] [place], [place] [address]   Lactic acid, plasma     Status: None   Collection Time: 05/13/21  5:08 AM  Result Value Ref Range   Lactic Acid, Venous 1.9 0.5 - 1.9 mmol/L    Comment: Performed at [facility] [address] [place], [place] [address]    Chemistries  Recent Labs  Lab 05/12/21 1948 05/13/21 0508  NA 136 131*  K 4.3 5.2*  CL 96* 97*  CO2 30 27  GLUCOSE 111* 96  BUN 10 10  CREATININE 0.94 0.78  CALCIUM 10.1 8.7*  MG  --  1.7  AST 26 44*  ALT 20 22  ALKPHOS 116 94  BILITOT 3.5* 3.5*    ------------------------------------------------------------------------------------------------------------------  ------------------------------------------------------------------------------------------------------------------ GFR: CrCl cannot be calculated (Unknown ideal weight.). Liver Function Tests: Recent Labs  Lab 05/12/21 1948 05/13/21 0508  AST 26 44*  ALT 20 22  ALKPHOS 116 94  BILITOT 3.5* 3.5*  PROT 8.1 6.1*  ALBUMIN 4.1 3.0*   No results for input(s): LIPASE, AMYLASE in the last 168 hours. No results for input(s): AMMONIA in the last 168 hours. Coagulation Profile: Recent Labs  Lab 05/12/21 2137 05/13/21 0508  INR 1.1 1.3*   Cardiac Enzymes: No results for input(s): CKTOTAL, CKMB, CKMBINDEX, TROPONINI in the last 168 hours. BNP (last 3 results) No results for input(s): PROBNP in the last 8760 hours. HbA1C: No results for input(s): HGBA1C in the last 72 hours. CBG: No results for input(s): GLUCAP in the last 168 hours. Lipid Profile: No results for input(s): CHOL, HDL, LDLCALC, TRIG, CHOLHDL, LDLDIRECT in the last 72 hours. Thyroid Function Tests: Recent Labs    05/13/21 0508  TSH 0.433   Anemia Panel: No results for input(s): VITAMINB12, FOLATE, FERRITIN, TIBC, IRON, RETICCTPCT in the last 72 hours.  --------------------------------------------------------------------------------------------------------------- Urine analysis:    Component Value Date/Time   COLORURINE YELLOW 05/12/2021 1927   APPEARANCEUR CLEAR 05/12/2021 1927   LABSPEC 1.020 05/12/2021 1927   PHURINE 6.0 05/12/2021 1927   GLUCOSEU NEGATIVE 05/12/2021 1927   GLUCOSEU NEGATIVE 03/28/2021 1018   HGBUR NEGATIVE 05/12/2021 1927   BILIRUBINUR SMALL (A) 05/12/2021 1927   BILIRUBINUR neg 05/10/2017 0909   KETONESUR 15 (A) 05/12/2021 1927   PROTEINUR NEGATIVE 05/12/2021 1927   UROBILINOGEN 0.2 03/28/2021 1018   NITRITE NEGATIVE 05/12/2021 1927   LEUKOCYTESUR NEGATIVE 05/12/2021  1927      Imaging Results:    DG Chest 1 View  Result Date: 05/12/2021 CLINICAL DATA:  The technologist did not provide a history. EXAM: CHEST  1 VIEW COMPARISON:  PA Lat 04/13/2021 FINDINGS: The heart size and mediastinal contours are within normal limits apart from mild aortic tortuosity. Both lungs are clear. The visualized skeletal structures are unremarkable aside from spinal bridging enthesopathy of DISH. IMPRESSION: No evidence of acute chest disease or interval changes. Electronically Signed  By: Telford Nab M.D.   On: 05/12/2021 21:29   CT Angio Chest Pulmonary Embolism (PE) W or WO Contrast  Result Date: 05/12/2021 CLINICAL DATA:  Tremors and sepsis. EXAM: CT ANGIOGRAPHY CHEST WITH CONTRAST TECHNIQUE: Multidetector CT imaging of the chest was performed using the standard protocol during bolus administration of intravenous contrast. Multiplanar CT image reconstructions and MIPs were obtained to evaluate the vascular anatomy. CONTRAST:  15m OMNIPAQUE IOHEXOL 350 MG/ML SOLN COMPARISON:  Oct 20, 2015 FINDINGS: Cardiovascular: A small amount of intraluminal low attenuation is seen within a subsegmental lower lobe branch of the left pulmonary artery (axial CT images 151 through 158, CT series 5/coronal reformatted images 88 through 91, CT series 8). Normal heart size. No pericardial effusion. Mediastinum/Nodes: No enlarged mediastinal, hilar, or axillary lymph nodes. Thyroid gland, trachea, and esophagus demonstrate no significant findings. Lungs/Pleura: Mild atelectatic changes are seen within the posterolateral aspect of the left upper lobe. A 1.1 cm noncalcified lung nodule is seen within the posterior aspect of the left upper lobe (axial CT image 75, CT series 4). This represents a new finding when compared to the prior exam. There is no evidence of a pleural effusion or pneumothorax. Upper Abdomen: No acute abnormality. Musculoskeletal: Multilevel degenerative changes seen throughout the  thoracic spine. Review of the MIP images confirms the above findings. IMPRESSION: 1. Small amount of pulmonary embolism within a subsegmental lower lobe branch of the left pulmonary artery. 2. 1.1 cm noncalcified lung nodule within the posterior aspect of the left upper lobe. This represents a new finding when compared to the prior exam. Consider one of the following in 3 months for both low-risk and high-risk individuals: (a) repeat chest CT, (b) follow-up PET-CT, or (c) tissue sampling. This recommendation follows the consensus statement: Guidelines for Management of Incidental Pulmonary Nodules Detected on CT Images: From the Fleischner Society 2017; Radiology 2017; 284:228-243. Electronically Signed   By: TVirgina NorfolkM.D.   On: 05/12/2021 23:26   CT ABDOMEN PELVIS W CONTRAST  Result Date: 05/12/2021 CLINICAL DATA:  Abdominal pain and fevers EXAM: CT ABDOMEN AND PELVIS WITH CONTRAST TECHNIQUE: Multidetector CT imaging of the abdomen and pelvis was performed using the standard protocol following bolus administration of intravenous contrast. CONTRAST:  1021mOMNIPAQUE IOHEXOL 350 MG/ML SOLN COMPARISON:  04/03/2021 FINDINGS: Lower chest: No acute abnormality. Hepatobiliary: Gallbladder is partially distended. Significant Peri cholecystic inflammatory changes are noted with similar findings in the adjacent hepatic tissue. These changes have increased in the interval from the prior exam and again are highly suspicious for acute cholecystitis. Prominence of the common bile duct is again seen and stable. Pancreas: Pancreas appears within normal limits. Spleen: Normal in size without focal abnormality. Adrenals/Urinary Tract: Adrenal glands are within normal limits. Kidneys again demonstrate a enhancing mass lesion within the substance of the right kidney measuring up to 3.3 cm suspicious for renal cell carcinoma and stable in appearance from the prior exam. Urologic consultation is recommended. No obstructive  changes are seen. Stable renal cysts are noted in the right kidney. The bladder is decompressed. Stomach/Bowel: Diverticular change of the colon is noted without evidence of diverticulitis. The appendix is barium filled and within normal limits. Small bowel and stomach are unremarkable. Vascular/Lymphatic: Atherosclerotic calcifications of the aorta are noted. Normal tapering is seen. No aneurysm is noted. Some upper abdominal peripancreatic lymph nodes are seen which appears centrally necrotic. The overall appearance is stable from the prior study. Additional node located between the head of the pancreas  and the left lobe of the liver is noted as well also stable in appearance. Persistent soft tissue density is noted surrounding proximal superior vena cava. Reproductive: Prostate appears within normal limits. Other: No abdominal wall hernia or abnormality. No abdominopelvic ascites. Musculoskeletal: Degenerative changes of lumbar spine are noted. IMPRESSION: Increase in the inflammatory changes surrounding the gallbladder and extending into the adjacent liver again suspicious for acute cholecystitis with localized hepatic inflammatory change. Correlate with pending ultrasound exam Upper abdominal lymphadenopathy which appears necrotic and stable in appearance from the prior exam. Patient is scheduled for PET-CT according to outpatient clinic notes. Stable enhancing right renal mass suspicious for renal cell carcinoma. By history the patient has met with urology. Remainder of the exam is stable from the prior study. Electronically Signed   By: Inez Catalina M.D.   On: 05/12/2021 23:34   US Abdomen Limited RUQ (LIVER/GB)  Result Date: 05/12/2021 CLINICAL DATA:  Fevers EXAM: ULTRASOUND ABDOMEN LIMITED RIGHT UPPER QUADRANT COMPARISON:  CT from earlier in the same day FINDINGS: Gallbladder: Gallbladder again shows evidence of cholelithiasis with gallbladder wall thickening and mild pericholecystic fluid. Negative  sonographic Murphy's sign is elicited however. The degree of inflammatory change in the adjacent liver is less well appreciated on this exam than on recent CT. Common bile duct: Diameter: 6.4 mm Liver: No focal lesion identified. Within normal limits in parenchymal echogenicity. Portal vein is patent on color Doppler imaging with normal direction of blood flow towards the liver. Other: None. IMPRESSION: Stable changes in the gallbladder with cholelithiasis and gallbladder wall thickening. Negative sonographic Murphy's sign is elicited however. This may be in part due to underlying medications. Electronically Signed   By: Inez Catalina M.D.   On: 05/12/2021 23:36       Assessment & Plan:    Principal Problem:   Sepsis ([place]) Active Problems:   Essential hypertension, benign   GERD (gastroesophageal reflux disease)   Hyperlipidemia   Acute cholecystitis   Pulmonary embolism (HCC)   Sepsis Sepsis secondary to cholecystitis SIRS criteria temp 102.6, heart rate 129, respiratory rate 24 Most likely source of infection is acute cholecystitis Life threatening organ dysfunction 2/2 infection is evidenced by: Lactic acid 2.4, T bili 3.5 Antibiotics started Zosyn Fluids given prior to admission 2.25 L bolus Pressors were not required at admission Sepsis order set utilized Monitor this patient on telemetry Acute cholecystitis General surgery following Continue Zosyn Surgery to follow-up on MRCP Continue pain control Small PE Continue heparin drip Echo DVT studies Hypertension Hold norvasc in setting of Hypotension 2/2 sepsis  HLD Continue Statin Dementia Continue aricept and namenda   DVT Prophylaxis-   Heparin - SCDs   AM Labs Ordered, also please review Full Orders  Family Communication: No family at bedside  Code Status:  FULL  Admission status: Inpatient :The appropriate admission status for this patient is INPATIENT. Inpatient status is judged to be reasonable and necessary  in order to provide the required intensity of service to ensure the patient's safety. The patient's presenting symptoms, physical exam findings, and initial radiographic and laboratory data in the context of their chronic comorbidities is felt to place them at high risk for further clinical deterioration. Furthermore, it is not anticipated that the patient will be medically stable for discharge from the hospital within 2 midnights of admission. The following factors support the admission status of inpatient.     The patient's presenting symptoms include rigors. The worrisome physical exam findings include distended abdomen and abdominal  tenderness. The initial radiographic and laboratory data are worrisome because of acute cholecystitis. The chronic co-morbidities include hyperlipidemia hypertension.       * I certify that at the point of admission it is my clinical judgment that the patient will require inpatient hospital care spanning beyond 2 midnights from the point of admission due to high intensity of service, high risk for further deterioration and high frequency of surveillance required.*  Disposition: Anticipated Discharge date 72 hours Discharge to home  Time spent in minutes : [place] DO

## 2021-05-13 NOTE — Consult Note (Signed)
Consulting Physician: Nickola Major Lourdes Manning  Referring Provider: Dr. Wyvonnia Dusky, ER provider  Chief Complaint: Abdominal pain  Reason for Consult: Possible cholecystitis   Subjective   HPI: Andre [name] is an 80 y.o. male who is here for abdominal pain.  He presented tachycardic, hypotensive, and febrile but responded quickly to resuscitation in the emergency department.    His abdominal pain has been going on for a few months now.  He was recently seen and evaluated in the ER, primary care office, urology office and general surgery office with Dr. Bobbye Morton.  There were plans for a cholecystectomy later this month.  He also has new findings concerning for renal cell carcinoma and urology is planning a PET scan.  He has some memory issues due to dementia but is able to provide some history.    It looks like his symptoms started towards the end of September.  For the last few months he has had burning right sided abdominal pain.  He has dealt with constipation requiring laxatives but now saying he is having diarrhea.  He is very sleepy this evening (at 4am) and doesn't provide much more history.  He underwent transanal excision of polypectomy site and retained polyp by Dr. Hulen Skains on 08/23/2007, for rectal adenocarcinoma.  Past Medical History:  Diagnosis Date   Arthritis    Coronary artery disease, non-occlusive    a. 06/2011: cath for abnormal exercise echo: Only 30-40% proximal RCA. Otherwise normal. b. 10/2015: cath for abnormal NST and atypical CP --> showed mild 30-40% stenosis in the prox-distal RCA   GERD (gastroesophageal reflux disease)    HA (headache)    Hypertension    Memory loss    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[place])    TIA (transient ischemic attack)    Weakness     Past Surgical History:  Procedure Laterality Date   CARDIAC CATHETERIZATION  January 2013   Proximal RCA 30-40%. Otherwise normal.   CARDIAC CATHETERIZATION N/A 10/18/2015   Procedure: Left Heart Cath and Coronary Angiography;   Surgeon: Jettie Booze, MD;  Location: [facility];  Service: Cardiovascular;  Laterality: N/A;   NM MYOVIEW LTD  March 2015   LOW RISK. No scar or ischemia. Normal EF (55%). No RWMA   surgical excision of rectal cancer      Family History  Problem Relation Age of Onset   Hypertension Mother    Cancer Mother    Liver disease Father     Social:  reports that he has been smoking cigarettes. He has been smoking an average of .25 packs per day. He has never used smokeless tobacco. He reports current alcohol use. He reports that he does not use drugs.  Allergies: No Known Allergies  Medications: Current Outpatient Medications  Medication Instructions   acetaminophen (TYLENOL) 500 mg, Oral, Every 6 hours PRN   amLODipine (NORVASC) 5 mg, Oral, Daily   aspirin EC 81 mg, Oral, Daily   atorvastatin (LIPITOR) 20 mg, Oral, Daily   donepezil (ARICEPT) 10 mg, Oral, Daily at bedtime   hydrochlorothiazide (HYDRODIURIL) 25 MG tablet TAKE 1 TABLET(25 MG) BY MOUTH DAILY   losartan (COZAAR) 100 mg, Oral, Daily   meloxicam (MOBIC) 15 mg, Oral, Daily PRN   memantine (NAMENDA) 10 mg, Oral, 2 times daily   omeprazole (PRILOSEC) 40 mg, Oral, Daily   oxycodone (OXY-IR) 5 mg, Oral, Every 4 hours PRN   sucralfate (CARAFATE) 1 g, Oral, 4 times daily   tizanidine (ZANAFLEX) 2 mg, Oral, At bedtime  PRN   triamcinolone (NASACORT) 55 MCG/ACT AERO nasal inhaler 2 sprays, Nasal, Daily   vitamin B-12 (CYANOCOBALAMIN) 1,000 mcg, Oral, Daily   Vitamin D, Ergocalciferol, (DRISDOL) 1.25 MG (50000 UNIT) CAPS capsule TAKE 1 CAPSULE BY MOUTH EVERY 7 DAYS    ROS - all of the below systems have been reviewed with the patient and positives are indicated with bold text General: chills, fever or night sweats Eyes: blurry vision or double vision ENT: epistaxis or sore throat Allergy/Immunology: itchy/watery eyes or nasal congestion Hematologic/Lymphatic: bleeding problems, blood clots or swollen lymph  nodes Endocrine: temperature intolerance or unexpected weight changes Breast: new or changing breast lumps or nipple discharge Resp: cough, shortness of breath, or wheezing CV: chest pain or dyspnea on exertion GI: as per HPI GU: dysuria, trouble voiding, or hematuria MSK: joint pain or joint stiffness Neuro: TIA or stroke symptoms Derm: pruritus and skin lesion changes Psych: anxiety and depression  Objective   PE Blood pressure (!) 144/80, pulse 69, temperature 99.2 F (37.3 C), temperature source Oral, resp. rate 19, SpO2 97 %. Constitutional: NAD; conversant; no deformities Eyes: Moist conjunctiva; no lid lag; anicteric; PERRL Neck: Trachea midline; no thyromegaly Lungs: Normal respiratory effort; no tactile fremitus CV: RRR; no palpable thrills; no pitting edema GI: Abd Soft, mild RUQ tenderness; no palpable hepatosplenomegaly MSK: Normal range of motion of extremities; no clubbing/cyanosis Psychiatric: Appropriate affect; alert and oriented x3 Lymphatic: No palpable cervical or axillary lymphadenopathy  Results for orders placed or performed during the hospital encounter of 05/12/21 (from the past 24 hour(s))  Urinalysis, Routine w reflex microscopic Urine, Clean Catch     Status: Abnormal   Collection Time: 05/12/21  7:27 PM  Result Value Ref Range   Color, Urine YELLOW YELLOW   APPearance CLEAR CLEAR   Specific Gravity, Urine 1.020 1.005 - 1.030   pH 6.0 5.0 - 8.0   Glucose, UA NEGATIVE NEGATIVE mg/dL   Hgb urine dipstick NEGATIVE NEGATIVE   Bilirubin Urine SMALL (A) NEGATIVE   Ketones, ur 15 (A) NEGATIVE mg/dL   Protein, ur NEGATIVE NEGATIVE mg/dL   Nitrite NEGATIVE NEGATIVE   Leukocytes,Ua NEGATIVE NEGATIVE  CBC with Differential     Status: None   Collection Time: 05/12/21  7:48 PM  Result Value Ref Range   WBC 8.9 4.0 - 10.5 K/uL   RBC 5.60 4.22 - 5.81 MIL/uL   Hemoglobin 16.7 13.0 - 17.0 g/dL   HCT 51.5 39.0 - 52.0 %   MCV 92.0 80.0 - 100.0 fL   MCH 29.8  26.0 - 34.0 pg   MCHC 32.4 30.0 - 36.0 g/dL   RDW 15.1 11.5 - 15.5 %   Platelets 225 150 - 400 K/uL   nRBC 0.0 0.0 - 0.2 %   Neutrophils Relative % 85 %   Neutro Abs 7.6 1.7 - 7.7 K/uL   Lymphocytes Relative 13 %   Lymphs Abs 1.1 0.7 - 4.0 K/uL   Monocytes Relative 1 %   Monocytes Absolute 0.1 0.1 - 1.0 K/uL   Eosinophils Relative 1 %   Eosinophils Absolute 0.1 0.0 - 0.5 K/uL   Basophils Relative 0 %   Basophils Absolute 0.0 0.0 - 0.1 K/uL   Immature Granulocytes 0 %   Abs Immature Granulocytes 0.02 0.00 - 0.07 K/uL  Comprehensive metabolic panel     Status: Abnormal   Collection Time: 05/12/21  7:48 PM  Result Value Ref Range   Sodium 136 135 - 145 mmol/L   Potassium 4.3  3.5 - 5.1 mmol/L   Chloride 96 (L) 98 - 111 mmol/L   CO2 30 22 - 32 mmol/L   Glucose, Bld 111 (H) 70 - 99 mg/dL   BUN 10 8 - 23 mg/dL   Creatinine, Ser 0.94 0.61 - 1.24 mg/dL   Calcium 10.1 8.9 - 10.3 mg/dL   Total Protein 8.1 6.5 - 8.1 g/dL   Albumin 4.1 3.5 - 5.0 g/dL   AST 26 15 - 41 U/L   ALT 20 0 - 44 U/L   Alkaline Phosphatase 116 38 - 126 U/L   Total Bilirubin 3.5 (H) 0.3 - 1.2 mg/dL   GFR, Estimated >60 >60 mL/min   Anion gap 10 5 - 15  Troponin I (High Sensitivity)     Status: None   Collection Time: 05/12/21  8:26 PM  Result Value Ref Range   Troponin I (High Sensitivity) 11 <18 ng/L  Resp Panel by RT-PCR (Flu A&B, Covid) Nasopharyngeal Swab     Status: None   Collection Time: 05/12/21  9:14 PM   Specimen: Nasopharyngeal Swab; Nasopharyngeal(NP) swabs in vial transport medium  Result Value Ref Range   SARS Coronavirus 2 by RT PCR NEGATIVE NEGATIVE   Influenza A by PCR NEGATIVE NEGATIVE   Influenza B by PCR NEGATIVE NEGATIVE  Lactic acid, plasma     Status: None   Collection Time: 05/12/21  9:14 PM  Result Value Ref Range   Lactic Acid, Venous 1.9 0.5 - 1.9 mmol/L  Protime-INR     Status: None   Collection Time: 05/12/21  9:37 PM  Result Value Ref Range   Prothrombin Time 14.7 11.4 -  15.2 seconds   INR 1.1 0.8 - 1.2  APTT     Status: None   Collection Time: 05/12/21  9:37 PM  Result Value Ref Range   aPTT 31 24 - 36 seconds  Lactic acid, plasma     Status: Abnormal   Collection Time: 05/13/21 12:14 AM  Result Value Ref Range   Lactic Acid, Venous 2.4 (HH) 0.5 - 1.9 mmol/L     Imaging Orders         DG Chest 1 View         CT Angio Chest Pulmonary Embolism (PE) W or WO Contrast         CT ABDOMEN PELVIS W CONTRAST         US Abdomen Limited RUQ (LIVER/GB)     CT Abdomen/Pelvis 05/12/21:  Radiology IMPRESSION: Increase in the inflammatory changes surrounding the gallbladder and extending into the adjacent liver again suspicious for acute cholecystitis with localized hepatic inflammatory change. Correlate with pending ultrasound exam Upper abdominal lymphadenopathy which appears necrotic and stable in appearance from the prior exam. Patient is scheduled for PET-CT according to outpatient clinic notes.  Stable enhancing right renal mass suspicious for renal cell carcinoma. By history the patient has met with urology. Remainder of the exam is stable from the prior study.  My impression: Prominence of the common bile duct noted, measures about 1.2 cm on previous CT.  Pancreatic duct also visible concerning for a double duct sign.  This combined with upper abdominal lymphadenopathy makes me concerned for the changes around the gallbladder on CT to be related to malignancy and not gallstones.   RUQ [place] 05/12/21: Gallbladder: Gallbladder again shows evidence of cholelithiasis with gallbladder wall thickening (5.3 mm) and mild pericholecystic fluid. Negative sonographic Murphy's sign is elicited however. The degree of inflammatory change in the adjacent  liver is less well appreciated on this exam than on recent CT. Common bile duct:Diameter: 6.4 mm   Assessment and Plan   Neco Kling is an 80 y.o. male with abdominal pain concerning for cholecystitis.  On CT  and ultrasound, there are significant changes around the gallbladder noted above.  Prominence of the common bile duct noted, measures about 1.2 cm on previous CT.  Pancreatic duct also visible. This is concerning for a double duct sign.  This combined with upper abdominal lymphadenopathy and a pulmonary embolism makes me concerned for the changes around the gallbladder on CT to be related to malignancy and not gallstones.  However, he is also being evaluated for renal cell carcinoma so these findings may not be related.  I recommend completing a MRCP to further evaluate.  The surgery team will follow up on these results.  He can be anticoagulated with a heparin drip while we work towards a definitive diagnosis.    ICD-10-CM   1. Sepsis with acute organ dysfunction without septic shock, due to unspecified organism, unspecified type ([place])  A41.9    R65.20     2. Sepsis ([place])  A41.9 DG Chest 1 View    DG Chest 1 View    3. RUQ pain  R10.11 US Abdomen Limited RUQ (LIVER/GB)    US Abdomen Limited RUQ (LIVER/GB)    4. Fever  R50.9 US Abdomen Limited RUQ (LIVER/GB)    US Abdomen Limited RUQ (LIVER/GB)    5. Acute pulmonary embolism, unspecified pulmonary embolism type, unspecified whether acute cor pulmonale present (HCC)  I26.99        Felicie Morn, MD  [facility] Use [email or web address] to contact on call provider

## 2021-05-13 NOTE — Progress Notes (Signed)
ANTICOAGULATION CONSULT NOTE - Initial Consult  Pharmacy Consult for Heparin Indication: pulmonary embolus  No Known Allergies  Patient Measurements:    Vital Signs: Temp: 102.6 F (39.2 C) (12/01 2208) Temp Source: Rectal (12/01 2208) BP: 102/69 (12/02 0000) Pulse Rate: 103 (12/02 0000)  Labs: Recent Labs    05/12/21 1948 05/12/21 2026 05/12/21 2137  HGB 16.7  --   --   HCT 51.5  --   --   PLT 225  --   --   APTT  --   --  31  LABPROT  --   --  14.7  INR  --   --  1.1  CREATININE 0.94  --   --   TROPONINIHS  --  11  --     CrCl cannot be calculated (Unknown ideal weight.).   Medical History: Past Medical History:  Diagnosis Date   Arthritis    Coronary artery disease, non-occlusive    a. 06/2011: cath for abnormal exercise echo: Only 30-40% proximal RCA. Otherwise normal. b. 10/2015: cath for abnormal NST and atypical CP --> showed mild 30-40% stenosis in the prox-distal RCA   GERD (gastroesophageal reflux disease)    HA (headache)    Hypertension    Memory loss    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HCC)    TIA (transient ischemic attack)    Weakness     Medications:  No current facility-administered medications on file prior to encounter.   Current Outpatient Medications on File Prior to Encounter  Medication Sig Dispense Refill   acetaminophen (TYLENOL) 500 MG tablet Take 500 mg by mouth every 6 (six) hours as needed.     amLODipine (NORVASC) 5 MG tablet Take 5 mg by mouth daily.     aspirin EC 81 MG tablet Take 1 tablet (81 mg total) by mouth daily.     atorvastatin (LIPITOR) 20 MG tablet Take 1 tablet (20 mg total) by mouth daily. 90 tablet 3   donepezil (ARICEPT) 10 MG tablet Take 1 tablet (10 mg total) by mouth at bedtime. 90 tablet 4   hydrochlorothiazide (HYDRODIURIL) 25 MG tablet TAKE 1 TABLET(25 MG) BY MOUTH DAILY 90 tablet 3   losartan (COZAAR) 100 MG tablet Take 1 tablet (100 mg total) by mouth daily. 90 tablet 3   meloxicam (MOBIC) 15 MG tablet Take 1 tablet  (15 mg total) by mouth daily as needed for pain. 90 tablet 1   memantine (NAMENDA) 10 MG tablet Take 1 tablet (10 mg total) by mouth 2 (two) times daily. 60 tablet 11   omeprazole (PRILOSEC) 40 MG capsule Take 1 capsule (40 mg total) by mouth daily. 30 capsule 5   oxycodone (OXY-IR) 5 MG capsule Take 1 capsule (5 mg total) by mouth every 4 (four) hours as needed. 10 capsule 0   sucralfate (CARAFATE) 1 g tablet Take 1 tablet (1 g total) by mouth 4 (four) times daily. 120 tablet 0   tizanidine (ZANAFLEX) 2 MG capsule Take 1 capsule (2 mg total) by mouth at bedtime as needed for muscle spasms. 30 capsule 5   triamcinolone (NASACORT) 55 MCG/ACT AERO nasal inhaler Place 2 sprays into the nose daily. 1 Inhaler 12   vitamin B-12 (CYANOCOBALAMIN) 1000 MCG tablet Take 1 tablet (1,000 mcg total) by mouth daily. 90 tablet 3   Vitamin D, Ergocalciferol, (DRISDOL) 1.25 MG (50000 UNIT) CAPS capsule TAKE 1 CAPSULE BY MOUTH EVERY 7 DAYS 10 capsule 0     Assessment: 80 y.o. male with PE for  heparin  Goal of Therapy:  Heparin level 0.3-0.7 units/ml Monitor platelets by anticoagulation protocol: Yes   Plan:  Heparin 4000 units IV bolus, then start heparin 1200 units/hr Check heparin level in 8 hours.  Caryl Pina 05/13/2021,12:22 AM

## 2021-05-14 DIAGNOSIS — B9620 Unspecified Escherichia coli [E. coli] as the cause of diseases classified elsewhere: Secondary | ICD-10-CM

## 2021-05-14 DIAGNOSIS — R7881 Bacteremia: Secondary | ICD-10-CM

## 2021-05-14 LAB — COMPREHENSIVE METABOLIC PANEL
ALT: 21 U/L (ref 0–44)
AST: 41 U/L (ref 15–41)
Albumin: 2.6 g/dL — ABNORMAL LOW (ref 3.5–5.0)
Alkaline Phosphatase: 74 U/L (ref 38–126)
Anion gap: 8 (ref 5–15)
BUN: 7 mg/dL — ABNORMAL LOW (ref 8–23)
CO2: 23 mmol/L (ref 22–32)
Calcium: 8.6 mg/dL — ABNORMAL LOW (ref 8.9–10.3)
Chloride: 100 mmol/L (ref 98–111)
Creatinine, Ser: 0.88 mg/dL (ref 0.61–1.24)
GFR, Estimated: >60 mL/min (ref >60)
Glucose, Bld: 96 mg/dL (ref 70–99)
Potassium: 3.7 mmol/L (ref 3.5–5.1)
Sodium: 131 mmol/L — ABNORMAL LOW (ref 135–145)
Total Bilirubin: 2.2 mg/dL — ABNORMAL HIGH (ref 0.3–1.2)
Total Protein: 5.6 g/dL — ABNORMAL LOW (ref 6.5–8.1)

## 2021-05-14 LAB — URINE CULTURE: Culture: NO GROWTH

## 2021-05-14 LAB — CBC
HCT: 40.1 % (ref 39.0–52.0)
Hemoglobin: 13.5 g/dL (ref 13.0–17.0)
MCH: 29.8 pg (ref 26.0–34.0)
MCHC: 33.7 g/dL (ref 30.0–36.0)
MCV: 88.5 fL (ref 80.0–100.0)
Platelets: 150 10*3/uL (ref 150–400)
RBC: 4.53 MIL/uL (ref 4.22–5.81)
RDW: 15.3 % (ref 11.5–15.5)
WBC: 8.6 10*3/uL (ref 4.0–10.5)
nRBC: 0 % (ref 0.0–0.2)

## 2021-05-14 LAB — HEPARIN LEVEL (UNFRACTIONATED): Heparin Unfractionated: 0.1 IU/mL — ABNORMAL LOW (ref 0.30–0.70)

## 2021-05-14 MED ORDER — METRONIDAZOLE 500 MG/100ML IV SOLN
500.0000 mg | Freq: Three times a day (TID) | INTRAVENOUS | Status: DC
  Administered 2021-05-14 – 2021-05-18 (×12): 500 mg via INTRAVENOUS
  Filled 2021-05-14 (×11): qty 100

## 2021-05-14 MED ORDER — ENOXAPARIN SODIUM 80 MG/0.8ML IJ SOSY
75.0000 mg | PREFILLED_SYRINGE | Freq: Two times a day (BID) | INTRAMUSCULAR | Status: DC
  Administered 2021-05-14 – 2021-05-17 (×7): 75 mg via SUBCUTANEOUS
  Filled 2021-05-14 (×7): qty 0.8

## 2021-05-14 MED ORDER — SODIUM CHLORIDE 0.9 % IV SOLN
2.0000 g | INTRAVENOUS | Status: DC
  Administered 2021-05-14 – 2021-05-20 (×7): 2 g via INTRAVENOUS
  Filled 2021-05-14 (×7): qty 20

## 2021-05-14 NOTE — Progress Notes (Signed)
Subjective/Chief Complaint: Pt feels batter  No further abdominal pain No GB distention so IR could not place drain Family informed since they were unaware    Objective: Vital signs in last 24 hours: Temp:  [97.9 F (36.6 C)-98.4 F (36.9 C)] 98 F (36.7 C) (12/03 0906) Pulse Rate:  [76-99] 92 (12/03 0906) Resp:  [16-28] 18 (12/03 0906) BP: (99-143)/(60-73) 99/65 (12/03 0906) SpO2:  [92 %-100 %] 97 % (12/03 0906)    Intake/Output from previous day: 12/02 0701 - 12/03 0700 In: 1758.9 [I.V.:1681.7; IV Piggyback:77.2] Out: 450 [Urine:450] Intake/Output this shift: No intake/output data recorded.  General appearance: alert and cooperative Resp: clear to auscultation bilaterally Cardio: regular rate and rhythm GI: soft, non-tender; bowel sounds normal; no masses,  no organomegaly Neurologic: Grossly normal  Lab Results:  Recent Labs    05/13/21 0508 05/14/21 0632  WBC 10.5 8.6  HGB 14.1 13.5  HCT 42.5 40.1  PLT 171 150   BMET Recent Labs    05/13/21 0508 05/13/21 0632 05/14/21 0632  NA 131*  --  131*  K 5.2* 3.6 3.7  CL 97*  --  100  CO2 27  --  23  GLUCOSE 96  --  96  BUN 10  --  7*  CREATININE 0.78  --  0.88  CALCIUM 8.7*  --  8.6*   PT/INR Recent Labs    05/12/21 2137 05/13/21 0508  LABPROT 14.7 16.0*  INR 1.1 1.3*   ABG No results for input(s): PHART, HCO3 in the last 72 hours.  Invalid input(s): PCO2, PO2  Studies/Results: DG Chest 1 View  Result Date: 05/12/2021 CLINICAL DATA:  The technologist did not provide a history. EXAM: CHEST  1 VIEW COMPARISON:  PA Lat 04/13/2021 FINDINGS: The heart size and mediastinal contours are within normal limits apart from mild aortic tortuosity. Both lungs are clear. The visualized skeletal structures are unremarkable aside from spinal bridging enthesopathy of DISH. IMPRESSION: No evidence of acute chest disease or interval changes. Electronically Signed   By: Telford Nab M.D.   On: 05/12/2021 21:29    CT Angio Chest Pulmonary Embolism (PE) W or WO Contrast  Result Date: 05/12/2021 CLINICAL DATA:  Tremors and sepsis. EXAM: CT ANGIOGRAPHY CHEST WITH CONTRAST TECHNIQUE: Multidetector CT imaging of the chest was performed using the standard protocol during bolus administration of intravenous contrast. Multiplanar CT image reconstructions and MIPs were obtained to evaluate the vascular anatomy. CONTRAST:  130m OMNIPAQUE IOHEXOL 350 MG/ML SOLN COMPARISON:  Oct 20, 2015 FINDINGS: Cardiovascular: A small amount of intraluminal low attenuation is seen within a subsegmental lower lobe branch of the left pulmonary artery (axial CT images 151 through 158, CT series 5/coronal reformatted images 88 through 91, CT series 8). Normal heart size. No pericardial effusion. Mediastinum/Nodes: No enlarged mediastinal, hilar, or axillary lymph nodes. Thyroid gland, trachea, and esophagus demonstrate no significant findings. Lungs/Pleura: Mild atelectatic changes are seen within the posterolateral aspect of the left upper lobe. A 1.1 cm noncalcified lung nodule is seen within the posterior aspect of the left upper lobe (axial CT image 75, CT series 4). This represents a new finding when compared to the prior exam. There is no evidence of a pleural effusion or pneumothorax. Upper Abdomen: No acute abnormality. Musculoskeletal: Multilevel degenerative changes seen throughout the thoracic spine. Review of the MIP images confirms the above findings. IMPRESSION: 1. Small amount of pulmonary embolism within a subsegmental lower lobe branch of the left pulmonary artery. 2. 1.1 cm noncalcified  lung nodule within the posterior aspect of the left upper lobe. This represents a new finding when compared to the prior exam. Consider one of the following in 3 months for both low-risk and high-risk individuals: (a) repeat chest CT, (b) follow-up PET-CT, or (c) tissue sampling. This recommendation follows the consensus statement: Guidelines for  Management of Incidental Pulmonary Nodules Detected on CT Images: From the Fleischner Society 2017; Radiology 2017; 284:228-243. Electronically Signed   By: Virgina Norfolk M.D.   On: 05/12/2021 23:26   CT ABDOMEN PELVIS W CONTRAST  Result Date: 05/12/2021 CLINICAL DATA:  Abdominal pain and fevers EXAM: CT ABDOMEN AND PELVIS WITH CONTRAST TECHNIQUE: Multidetector CT imaging of the abdomen and pelvis was performed using the standard protocol following bolus administration of intravenous contrast. CONTRAST:  132m OMNIPAQUE IOHEXOL 350 MG/ML SOLN COMPARISON:  04/03/2021 FINDINGS: Lower chest: No acute abnormality. Hepatobiliary: Gallbladder is partially distended. Significant Peri cholecystic inflammatory changes are noted with similar findings in the adjacent hepatic tissue. These changes have increased in the interval from the prior exam and again are highly suspicious for acute cholecystitis. Prominence of the common bile duct is again seen and stable. Pancreas: Pancreas appears within normal limits. Spleen: Normal in size without focal abnormality. Adrenals/Urinary Tract: Adrenal glands are within normal limits. Kidneys again demonstrate a enhancing mass lesion within the substance of the right kidney measuring up to 3.3 cm suspicious for renal cell carcinoma and stable in appearance from the prior exam. Urologic consultation is recommended. No obstructive changes are seen. Stable renal cysts are noted in the right kidney. The bladder is decompressed. Stomach/Bowel: Diverticular change of the colon is noted without evidence of diverticulitis. The appendix is barium filled and within normal limits. Small bowel and stomach are unremarkable. Vascular/Lymphatic: Atherosclerotic calcifications of the aorta are noted. Normal tapering is seen. No aneurysm is noted. Some upper abdominal peripancreatic lymph nodes are seen which appears centrally necrotic. The overall appearance is stable from the prior study.  Additional node located between the head of the pancreas and the left lobe of the liver is noted as well also stable in appearance. Persistent soft tissue density is noted surrounding proximal superior vena cava. Reproductive: Prostate appears within normal limits. Other: No abdominal wall hernia or abnormality. No abdominopelvic ascites. Musculoskeletal: Degenerative changes of lumbar spine are noted. IMPRESSION: Increase in the inflammatory changes surrounding the gallbladder and extending into the adjacent liver again suspicious for acute cholecystitis with localized hepatic inflammatory change. Correlate with pending ultrasound exam Upper abdominal lymphadenopathy which appears necrotic and stable in appearance from the prior exam. Patient is scheduled for PET-CT according to outpatient clinic notes. Stable enhancing right renal mass suspicious for renal cell carcinoma. By history the patient has met with urology. Remainder of the exam is stable from the prior study. Electronically Signed   By: MInez CatalinaM.D.   On: 05/12/2021 23:34   MR 3D Recon At Scanner  Result Date: 05/13/2021 CLINICAL DATA:  Abdominal pain. Biliary obstruction suspected. Rigors. Nausea and vomiting. Diarrhea for 2 weeks. Weight loss. EXAM: MRI ABDOMEN WITHOUT AND WITH CONTRAST (INCLUDING MRCP) TECHNIQUE: Multiplanar multisequence MR imaging of the abdomen was performed both before and after the administration of intravenous contrast. Heavily T2-weighted images of the biliary and pancreatic ducts were obtained, and three-dimensional MRCP images were rendered by post processing. CONTRAST:  7.538mGADAVIST GADOBUTROL 1 MMOL/ML IV SOLN COMPARISON:  Ultrasound and CT of 1 day prior. FINDINGS: Portions of exam are mild to moderately motion degraded.  Lower chest: New right base airspace disease since yesterday's CT. Mild cardiomegaly, without pericardial or pleural effusion. Hepatobiliary: Dependent small gallstones. The gallbladder is  contracted with apparent mild gallbladder wall thickening. Within the pericholecystic right liver lobe, a 3.5 x 3.2 cm centrally hypoenhancing and mildly peripherally hyperenhancing lesion is seen, including on 71/1104. Mildly T2 hypointense with mildly restricted diffusion. New since 04/03/2021. Mild intrahepatic biliary duct dilatation. The common duct is chronically dilated at 11 mm on 22/2. Followed to just above the level of the ampulla. On some images, including the dedicated MRCP sequence, a distal common duct filling defect is possible. Example at 6 mm 85/6. Pancreas: Pancreatic atrophy, without duct dilatation. Nonspecific edema within the left anterior pararenal space including on 18/4. Spleen:  Normal in size, without focal abnormality. Adrenals/Urinary Tract: Normal adrenal glands. Bilateral renal cysts. Arterially hyperenhancing interpolar right renal 2.8 x 2.6 cm lesion on 90/1101. No hydronephrosis. Stomach/Bowel: Grossly normal stomach and abdominal bowel loops. Vascular/Lymphatic:  Aortic atherosclerosis.  Patent renal veins. Again identified is necrotic adenopathy within the upper abdomen. Example gastrohepatic ligament node of 1.7 cm on 65/1102. A portacaval 11 mm necrotic node on 74/1103 Other:  No ascites. Musculoskeletal: No acute osseous abnormality. IMPRESSION: 1. Motion degraded images. 2. Cholelithiasis. Suggestion of wall thickening in the setting of underdistention. Development of a pericholecystic liver lesion which is suspicious for extension of chronic cholecystitis. Direct extension of gallbladder carcinoma felt less likely, given absence of dominant gallbladder mass. 3. Upper abdominal necrotic adenopathy, present back to 04/03/2021. Outpatient PET may be informative to direct sampling. 4. Mild intra and extrahepatic biliary duct dilatation. Equivocal findings for distal common duct stone, given above limitations. If bilirubin is elevated, consider ERCP. 5. Interpolar right renal  enhancing mass, consistent with renal cell carcinoma. 6. Mild edema within the anterior pararenal space. Correlate with pancreatic enzyme levels to exclude mild pancreatitis. 7. Right base airspace disease, new since yesterday's CT. Favor atelectasis. Electronically Signed   By: Abigail Miyamoto M.D.   On: 05/13/2021 13:31   MR ABDOMEN MRCP W WO CONTAST  Result Date: 05/13/2021 CLINICAL DATA:  Abdominal pain. Biliary obstruction suspected. Rigors. Nausea and vomiting. Diarrhea for 2 weeks. Weight loss. EXAM: MRI ABDOMEN WITHOUT AND WITH CONTRAST (INCLUDING MRCP) TECHNIQUE: Multiplanar multisequence MR imaging of the abdomen was performed both before and after the administration of intravenous contrast. Heavily T2-weighted images of the biliary and pancreatic ducts were obtained, and three-dimensional MRCP images were rendered by post processing. CONTRAST:  7.65m GADAVIST GADOBUTROL 1 MMOL/ML IV SOLN COMPARISON:  Ultrasound and CT of 1 day prior. FINDINGS: Portions of exam are mild to moderately motion degraded. Lower chest: New right base airspace disease since yesterday's CT. Mild cardiomegaly, without pericardial or pleural effusion. Hepatobiliary: Dependent small gallstones. The gallbladder is contracted with apparent mild gallbladder wall thickening. Within the pericholecystic right liver lobe, a 3.5 x 3.2 cm centrally hypoenhancing and mildly peripherally hyperenhancing lesion is seen, including on 71/1104. Mildly T2 hypointense with mildly restricted diffusion. New since 04/03/2021. Mild intrahepatic biliary duct dilatation. The common duct is chronically dilated at 11 mm on 22/2. Followed to just above the level of the ampulla. On some images, including the dedicated MRCP sequence, a distal common duct filling defect is possible. Example at 6 mm 85/6. Pancreas: Pancreatic atrophy, without duct dilatation. Nonspecific edema within the left anterior pararenal space including on 18/4. Spleen:  Normal in size,  without focal abnormality. Adrenals/Urinary Tract: Normal adrenal glands. Bilateral renal cysts. Arterially hyperenhancing  interpolar right renal 2.8 x 2.6 cm lesion on 90/1101. No hydronephrosis. Stomach/Bowel: Grossly normal stomach and abdominal bowel loops. Vascular/Lymphatic:  Aortic atherosclerosis.  Patent renal veins. Again identified is necrotic adenopathy within the upper abdomen. Example gastrohepatic ligament node of 1.7 cm on 65/1102. A portacaval 11 mm necrotic node on 74/1103 Other:  No ascites. Musculoskeletal: No acute osseous abnormality. IMPRESSION: 1. Motion degraded images. 2. Cholelithiasis. Suggestion of wall thickening in the setting of underdistention. Development of a pericholecystic liver lesion which is suspicious for extension of chronic cholecystitis. Direct extension of gallbladder carcinoma felt less likely, given absence of dominant gallbladder mass. 3. Upper abdominal necrotic adenopathy, present back to 04/03/2021. Outpatient PET may be informative to direct sampling. 4. Mild intra and extrahepatic biliary duct dilatation. Equivocal findings for distal common duct stone, given above limitations. If bilirubin is elevated, consider ERCP. 5. Interpolar right renal enhancing mass, consistent with renal cell carcinoma. 6. Mild edema within the anterior pararenal space. Correlate with pancreatic enzyme levels to exclude mild pancreatitis. 7. Right base airspace disease, new since yesterday's CT. Favor atelectasis. Electronically Signed   By: Abigail Miyamoto M.D.   On: 05/13/2021 13:31   ECHOCARDIOGRAM COMPLETE  Result Date: 05/13/2021    ECHOCARDIOGRAM REPORT   Patient Name:   Andre [name] Date of Exam: 05/13/2021 Medical Rec #:  [id number]   Height:       71.0 in Accession #:    [id number]  Weight:       165.0 lb Date of Birth:  Feb 17, 1941   BSA:          1.943 m Patient Age:    36 years    BP:           101/70 mmHg Patient Gender: M           HR:           74 bpm. Exam Location:   Inpatient Procedure: 2D Echo, Cardiac Doppler, Color Doppler and Intracardiac            Opacification Agent Indications:    Pulmonary emblous  History:        Patient has no prior history of Echocardiogram examinations.                 CAD, COPD; Risk Factors:Hypertension.  Sonographer:    Glo Herring Referring Phys: [id number] [place]  1. Left ventricular ejection fraction, by estimation, is 40 to 45%. The left ventricle has mildly decreased function. The left ventricle demonstrates global hypokinesis. Left ventricular diastolic parameters are indeterminate.  2. Right ventricular systolic function is normal. The right ventricular size is mildly enlarged. Tricuspid regurgitation signal is inadequate for assessing PA pressure.  3. Right atrial size was mildly dilated.  4. The mitral valve is normal in structure. Trivial mitral valve regurgitation. No evidence of mitral stenosis.  5. The aortic valve is tricuspid. Aortic valve regurgitation is not visualized. No aortic stenosis is present.  6. The inferior vena cava is dilated in size with >50% respiratory variability, suggesting right atrial pressure of 8 mmHg. FINDINGS  Left Ventricle: Left ventricular ejection fraction, by estimation, is 40 to 45%. The left ventricle has mildly decreased function. The left ventricle demonstrates global hypokinesis. The left ventricular internal cavity size was normal in size. There is  no left ventricular hypertrophy. Left ventricular diastolic parameters are indeterminate. Right Ventricle: The right ventricular size is mildly enlarged. No increase in right ventricular wall thickness. Right ventricular systolic function  is normal. Tricuspid regurgitation signal is inadequate for assessing PA pressure. Left Atrium: Left atrial size was normal in size. Right Atrium: Right atrial size was mildly dilated. Pericardium: There is no evidence of pericardial effusion. Mitral Valve: The mitral valve is normal in  structure. Trivial mitral valve regurgitation. No evidence of mitral valve stenosis. Tricuspid Valve: The tricuspid valve is normal in structure. Tricuspid valve regurgitation is not demonstrated. Aortic Valve: The aortic valve is tricuspid. Aortic valve regurgitation is not visualized. No aortic stenosis is present. Aortic valve mean gradient measures 1.0 mmHg. Aortic valve peak gradient measures 2.7 mmHg. Aortic valve area, by VTI measures 3.05 cm. Pulmonic Valve: The pulmonic valve was not well visualized. Pulmonic valve regurgitation is not visualized. Aorta: The aortic root is normal in size and structure. Venous: The inferior vena cava is dilated in size with greater than 50% respiratory variability, suggesting right atrial pressure of 8 mmHg. IAS/Shunts: The interatrial septum was not well visualized.  LEFT VENTRICLE PLAX 2D LVIDd:         4.70 cm      Diastology LVIDs:         3.00 cm      LV e' medial:    7.18 cm/s LV PW:         0.90 cm      LV E/e' medial:  8.4 LV IVS:        0.90 cm      LV e' lateral:   8.92 cm/s LVOT diam:     2.10 cm      LV E/e' lateral: 6.8 LV SV:         52 LV SV Index:   27 LVOT Area:     3.46 cm  LV Volumes (MOD) LV vol d, MOD A2C: 117.0 ml LV vol d, MOD A4C: 129.0 ml LV vol s, MOD A2C: 73.4 ml LV vol s, MOD A4C: 81.0 ml LV SV MOD A2C:     43.6 ml LV SV MOD A4C:     129.0 ml LV SV MOD BP:      44.8 ml RIGHT VENTRICLE             IVC RV Basal diam:  4.20 cm     IVC diam: 2.40 cm RV S prime:     11.90 cm/s LEFT ATRIUM           Index        RIGHT ATRIUM           Index LA diam:      2.60 cm 1.34 cm/m   RA Area:     23.40 cm LA Vol (A2C): 35.8 ml 18.42 ml/m  RA Volume:   74.30 ml  38.24 ml/m LA Vol (A4C): 58.1 ml 29.90 ml/m  AORTIC VALVE AV Area (Vmax):    3.12 cm AV Area (Vmean):   3.01 cm AV Area (VTI):     3.05 cm AV Vmax:           81.90 cm/s AV Vmean:          55.200 cm/s AV VTI:            0.169 m AV Peak Grad:      2.7 mmHg AV Mean Grad:      1.0 mmHg LVOT Vmax:          73.80 cm/s LVOT Vmean:        47.900 cm/s LVOT VTI:  0.149 m LVOT/AV VTI ratio: 0.88  AORTA Ao Root diam: 3.40 cm MITRAL VALVE MV Area (PHT): 3.83 cm    SHUNTS MV Decel Time: 198 msec    Systemic VTI:  0.15 m MV E velocity: 60.40 cm/s  Systemic Diam: 2.10 cm MV A velocity: 75.00 cm/s MV E/A ratio:  0.81 Oswaldo Milian MD Electronically signed by Oswaldo Milian MD Signature Date/Time: 05/13/2021/4:24:59 PM    Final    VAS [place] LOWER EXTREMITY VENOUS (DVT)  Result Date: 05/13/2021  Lower Venous DVT Study Patient Name:  Andre [name]  Date of Exam:   05/13/2021 Medical Rec #: [id number]    Accession #:    [id number] Date of Birth: 1940-08-17    Patient Gender: M Patient Age:   37 years Exam Location:  [facility] Procedure:      VAS [place] LOWER EXTREMITY VENOUS (DVT) Referring Phys: ASIA ZIERLE-GHOSH --------------------------------------------------------------------------------  Indications: Pulmonary embolism.  Comparison Study: No previous exams Performing Technologist: Jody Hill RVT, RDMS  Examination Guidelines: A complete evaluation includes B-mode imaging, spectral Doppler, color Doppler, and power Doppler as needed of all accessible portions of each vessel. Bilateral testing is considered an integral part of a complete examination. Limited examinations for reoccurring indications may be performed as noted. The reflux portion of the exam is performed with the patient in reverse Trendelenburg.  +---------+---------------+---------+-----------+----------+--------------+ RIGHT    CompressibilityPhasicitySpontaneityPropertiesThrombus Aging +---------+---------------+---------+-----------+----------+--------------+ CFV      Full           Yes      Yes                                 +---------+---------------+---------+-----------+----------+--------------+ SFJ      Full                                                         +---------+---------------+---------+-----------+----------+--------------+ FV Prox  Full           Yes      Yes                                 +---------+---------------+---------+-----------+----------+--------------+ FV Mid   Full           Yes      Yes                                 +---------+---------------+---------+-----------+----------+--------------+ FV DistalFull           Yes      Yes                                 +---------+---------------+---------+-----------+----------+--------------+ PFV      Full                                                        +---------+---------------+---------+-----------+----------+--------------+ POP      Full  Yes      Yes                                 +---------+---------------+---------+-----------+----------+--------------+ PTV      Full                                                        +---------+---------------+---------+-----------+----------+--------------+ PERO     Full                                                        +---------+---------------+---------+-----------+----------+--------------+   +---------+---------------+---------+-----------+----------+--------------+ LEFT     CompressibilityPhasicitySpontaneityPropertiesThrombus Aging +---------+---------------+---------+-----------+----------+--------------+ CFV      Full           Yes      Yes                                 +---------+---------------+---------+-----------+----------+--------------+ SFJ      Full                                                        +---------+---------------+---------+-----------+----------+--------------+ FV Prox  Full           Yes      Yes                                 +---------+---------------+---------+-----------+----------+--------------+ FV Mid   Full           Yes      Yes                                  +---------+---------------+---------+-----------+----------+--------------+ FV DistalFull           Yes      Yes                                 +---------+---------------+---------+-----------+----------+--------------+ PFV      Full                                                        +---------+---------------+---------+-----------+----------+--------------+ POP      Full           Yes      Yes                                 +---------+---------------+---------+-----------+----------+--------------+ PTV      Full                                                        +---------+---------------+---------+-----------+----------+--------------+  PERO     Full                                                        +---------+---------------+---------+-----------+----------+--------------+     Summary: RIGHT: - There is no evidence of deep vein thrombosis in the lower extremity.  LEFT: - There is no evidence of deep vein thrombosis in the lower extremity.  *See table(s) above for measurements and observations. Electronically signed by Deitra Mayo MD on 05/13/2021 at 11:58:47 AM.    Final    US Abdomen Limited RUQ (LIVER/GB)  Result Date: 05/12/2021 CLINICAL DATA:  Fevers EXAM: ULTRASOUND ABDOMEN LIMITED RIGHT UPPER QUADRANT COMPARISON:  CT from earlier in the same day FINDINGS: Gallbladder: Gallbladder again shows evidence of cholelithiasis with gallbladder wall thickening and mild pericholecystic fluid. Negative sonographic Murphy's sign is elicited however. The degree of inflammatory change in the adjacent liver is less well appreciated on this exam than on recent CT. Common bile duct: Diameter: 6.4 mm Liver: No focal lesion identified. Within normal limits in parenchymal echogenicity. Portal vein is patent on color Doppler imaging with normal direction of blood flow towards the liver. Other: None. IMPRESSION: Stable changes in the gallbladder with cholelithiasis and  gallbladder wall thickening. Negative sonographic Murphy's sign is elicited however. This may be in part due to underlying medications. Electronically Signed   By: Inez Catalina M.D.   On: 05/12/2021 23:36    Anti-infectives: Anti-infectives (From admission, onward)    Start     Dose/Rate Route Frequency Ordered Stop   05/13/21 0600  piperacillin-tazobactam (ZOSYN) IVPB 3.375 g        3.375 g 12.5 mL/hr over 240 Minutes Intravenous Every 8 hours 05/13/21 0305     05/12/21 2130  piperacillin-tazobactam (ZOSYN) IVPB 3.375 g        3.375 g 100 mL/hr over 30 Minutes Intravenous  Once 05/12/21 2124 05/12/21 2234   05/12/21 2115  ceFEPIme (MAXIPIME) 2 g in sodium chloride 0.9 % 100 mL IVPB  Status:  Discontinued        2 g 200 mL/hr over 30 Minutes Intravenous  Once 05/12/21 2114 05/12/21 2124   05/12/21 2115  metroNIDAZOLE (FLAGYL) IVPB 500 mg  Status:  Discontinued        500 mg 100 mL/hr over 60 Minutes Intravenous  Once 05/12/21 2114 05/12/21 2124   05/12/21 2115  vancomycin (VANCOREADY) IVPB 1500 mg/300 mL  Status:  Discontinued        1,500 mg 150 mL/hr over 120 Minutes Intravenous  Once 05/12/21 2114 05/12/21 2124       Assessment/Plan: Gallbladder wall thickening, questionable cholecystitis, questionable gallbladder mass/liver mass-clinically he is better today.  He is nontender.  Advance diet.  This may require further work-up but for right now continue anticoagulation.  No acute surgical indication.  No role for percutaneous cholecystostomy tube.  Continue antibiotics and treat medically for now.   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  Multiple retroperitoneal necrotic lymph nodes noted.  Patient is followed by urology.  No acute surgical indication for now.  May need work-up of gallbladder/mass and/or HIDA scan at some point but we will follow along.     LOS: 1 day    Joyice Faster Lajeana Strough 05/14/2021

## 2021-05-14 NOTE — Progress Notes (Signed)
PROGRESS NOTE    [name]  MRN:[id number] DOB: 05/30/1941 DOA: 05/12/2021 PCP: John, James W, MD    Chief Complaint  Patient presents with   Tremors   Code Sepsis    Brief Narrative:   Andre [name]  is a 80 y.o. male, with history of GERD, coronary artery disease, hypertension, rectal cancer, TIA, weakness, presents ED with a chief complaint of rigors, fever, patient endorses abdominal pain for few weeks now, his work-up in ED significant for acute cholecystitis.  Assessment & Plan:   Principal Problem:   Sepsis (HCC) Active Problems:   Essential hypertension, benign   GERD (gastroesophageal reflux disease)   Hyperlipidemia   Acute cholecystitis   Pulmonary embolism (HCC)   Sepsis secondary to cholecystitis/E. coli bacteremia -Sepsis present on admission, as febrile 102.6, tachycardic 129, tachypneic 24, hypotensive 88/70 -Received stress dose IV steroids, responded to steroids and fluids, not require any pressors. -Work-up significant for cholecystitis, confirmed by MRI abdomen. -Continue with IV fluids -Agement per general surgery, for now continue with antibiotics, timing for surgery to be determined, there is no role for percutaneous cholecystostomy drain as no evidence of gallbladder distention. -Patient on IV Zosyn, will narrow to IV Rocephin and Flagyl.  Small PE -Continue with Lovenox - no DVT on venous dopplers  Hypertension - Hold norvasc in setting of Hypotension 2/2 sepsis  -Once blood pressure started to increase I would consider beta-blockers or ACE/ARB given EF 40 to 45% on echo done this admission.  Patient denies abdominal pain today,  HLD - Continue Statin  Right kidney leasion - followed by Dr Gay, high suspension for malignancy   Dementia - Continue aricept and namenda  1.1 cm noncalcified lung nodule within the posterior aspect of the left upper lobe. - will need repeat CT in 3 months.  DVT prophylaxis: heparin GTT Code Status:  Full Family Communication: D/W daughter at bedside Disposition:   Status is: Inpatient  Remains inpatient appropriate because: sepsis       Consultants:  General surgery  Subjective:  Patient denies abdominal pain today, report he is feeling better today, no nausea, no vomiting  Objective: Vitals:   05/13/21 1934 05/13/21 2341 05/14/21 0906 05/14/21 1228  BP: 140/66 131/60 99/65 120/64  Pulse: 87 99 92 97  Resp: 16 16 18 20  Temp: 97.9 F (36.6 C) 98 F (36.7 C) 98 F (36.7 C) 98 F (36.7 C)  TempSrc: Oral Oral Oral Oral  SpO2: 93% 95% 97% 95%  Weight:   73.2 kg   Height:   5' 10" (1.778 m)     Intake/Output Summary (Last 24 hours) at 05/14/2021 1429 Last data filed at 05/14/2021 0600 Gross per 24 hour  Intake 1758.9 ml  Output 450 ml  Net 1308.9 ml   Filed Weights   05/14/21 0906  Weight: 73.2 kg    Examination:  Awake Alert, Oriented X 3, No new F.N deficits, Normal affect Symmetrical Chest wall movement, Good air movement bilaterally, CTAB RRR,No Gallops,Rubs or new Murmurs, No Parasternal Heave +ve B.Sounds, Abd Soft, No tenderness, No rebound - guarding or rigidity. No Cyanosis, Clubbing or edema, No new Rash or bruise       Data Reviewed: I have personally reviewed following labs and imaging studies  CBC: Recent Labs  Lab 05/12/21 1948 05/13/21 0508 05/14/21 0632  WBC 8.9 10.5 8.6  NEUTROABS 7.6 9.6*  --   HGB 16.7 14.1 13.5  HCT 51.5 42.5 40.1  MCV 92.0 89.9 88.5    PLT 225 171 150    Basic Metabolic Panel: Recent Labs  Lab 05/12/21 1948 05/13/21 0508 05/13/21 0632 05/14/21 0632  NA 136 131*  --  131*  K 4.3 5.2* 3.6 3.7  CL 96* 97*  --  100  CO2 30 27  --  23  GLUCOSE 111* 96  --  96  BUN 10 10  --  7*  CREATININE 0.94 0.78  --  0.88  CALCIUM 10.1 8.7*  --  8.6*  MG  --  1.7  --   --     GFR: Estimated Creatinine Clearance: 70.3 mL/min (by C-G formula based on SCr of 0.88 mg/dL).  Liver Function Tests: Recent Labs   Lab 05/12/21 1948 05/13/21 0508 05/14/21 0632  AST 26 44* 41  ALT 20 22 21  ALKPHOS 116 94 74  BILITOT 3.5* 3.5* 2.2*  PROT 8.1 6.1* 5.6*  ALBUMIN 4.1 3.0* 2.6*    CBG: No results for input(s): GLUCAP in the last 168 hours.   Recent Results (from the past 240 hour(s))  Resp Panel by RT-PCR (Flu A&B, Covid) Nasopharyngeal Swab     Status: None   Collection Time: 05/12/21  9:14 PM   Specimen: Nasopharyngeal Swab; Nasopharyngeal(NP) swabs in vial transport medium  Result Value Ref Range Status   SARS Coronavirus 2 by RT PCR NEGATIVE NEGATIVE Final    Comment: (NOTE) SARS-CoV-2 target nucleic acids are NOT DETECTED.  The SARS-CoV-2 RNA is generally detectable in upper respiratory specimens during the acute phase of infection. The lowest concentration of SARS-CoV-2 viral copies this assay can detect is 138 copies/mL. A negative result does not preclude SARS-Cov-2 infection and should not be used as the sole basis for treatment or other patient management decisions. A negative result may occur with  improper specimen collection/handling, submission of specimen other than nasopharyngeal swab, presence of viral mutation(s) within the areas targeted by this assay, and inadequate number of viral copies(<138 copies/mL). A negative result must be combined with clinical observations, patient history, and epidemiological information. The expected result is Negative.  Fact Sheet for Patients:  https://www.fda.gov/media/152166/download  Fact Sheet for Healthcare Providers:  https://www.fda.gov/media/152162/download  This test is no t yet approved or cleared by the [place] FDA and  has been authorized for detection and/or diagnosis of SARS-CoV-2 by FDA under an Emergency Use Authorization (EUA). This EUA will remain  in effect (meaning this test can be used) for the duration of the COVID-19 declaration under Section 564(b)(1) of the Act, 21 U.S.C.section 360bbb-3(b)(1), unless  the authorization is terminated  or revoked sooner.       Influenza A by PCR NEGATIVE NEGATIVE Final   Influenza B by PCR NEGATIVE NEGATIVE Final    Comment: (NOTE) The Xpert Xpress SARS-CoV-2/FLU/RSV plus assay is intended as an aid in the diagnosis of influenza from Nasopharyngeal swab specimens and should not be used as a sole basis for treatment. Nasal washings and aspirates are unacceptable for Xpert Xpress SARS-CoV-2/FLU/RSV testing.  Fact Sheet for Patients: https://www.fda.gov/media/152166/download  Fact Sheet for Healthcare Providers: https://www.fda.gov/media/152162/download  This test is not yet approved or cleared by the [place] FDA and has been authorized for detection and/or diagnosis of SARS-CoV-2 by FDA under an Emergency Use Authorization (EUA). This EUA will remain in effect (meaning this test can be used) for the duration of the COVID-19 declaration under Section 564(b)(1) of the Act, 21 U.S.C. section 360bbb-3(b)(1), unless the authorization is terminated or revoked.  Performed at [facility],   [address] [place], [place] [address]   Blood Culture (routine x 2)     Status: Abnormal (Preliminary result)   Collection Time: 05/12/21  9:19 PM   Specimen: BLOOD  Result Value Ref Range Status   Specimen Description BLOOD SITE NOT SPECIFIED  Final   Special Requests   Final    BOTTLES DRAWN AEROBIC AND ANAEROBIC Blood Culture adequate volume   Culture  Setup Time (A)  Final    GRAM VARIABLE ROD IN BOTH AEROBIC AND ANAEROBIC BOTTLES CRITICAL RESULT CALLED TO, READ BACK BY AND VERIFIED WITH: PHARMD E MARTIN 120222 AT 1447 BY CM    Culture (A)  Final    ESCHERICHIA COLI SUSCEPTIBILITIES TO FOLLOW Performed at [facility], [address] , [place] [address]    Report Status PENDING  Incomplete  Blood Culture ID Panel (Reflexed)     Status: Abnormal   Collection Time: 05/12/21  9:19 PM  Result Value Ref Range Status    Enterococcus faecalis NOT DETECTED NOT DETECTED Final   Enterococcus Faecium NOT DETECTED NOT DETECTED Final   Listeria monocytogenes NOT DETECTED NOT DETECTED Final   Staphylococcus species NOT DETECTED NOT DETECTED Final   Staphylococcus aureus (BCID) NOT DETECTED NOT DETECTED Final   Staphylococcus epidermidis NOT DETECTED NOT DETECTED Final   Staphylococcus lugdunensis NOT DETECTED NOT DETECTED Final   Streptococcus species NOT DETECTED NOT DETECTED Final   Streptococcus agalactiae NOT DETECTED NOT DETECTED Final   Streptococcus pneumoniae NOT DETECTED NOT DETECTED Final   Streptococcus pyogenes NOT DETECTED NOT DETECTED Final   A.calcoaceticus-baumannii NOT DETECTED NOT DETECTED Final   Bacteroides fragilis NOT DETECTED NOT DETECTED Final   Enterobacterales DETECTED (A) NOT DETECTED Final    Comment: Enterobacterales represent a large order of gram negative bacteria, not a single organism. CRITICAL RESULT CALLED TO, READ BACK BY AND VERIFIED WITH: PHARMD E MARTIN 120222 AR 1457 BY CM    Enterobacter cloacae complex NOT DETECTED NOT DETECTED Final   Escherichia coli DETECTED (A) NOT DETECTED Final    Comment: CRITICAL RESULT CALLED TO, READ BACK BY AND VERIFIED WITH: PHARMD E MARTIN 120222 AT 1457 BY CM    Klebsiella aerogenes NOT DETECTED NOT DETECTED Final   Klebsiella oxytoca NOT DETECTED NOT DETECTED Final   Klebsiella pneumoniae NOT DETECTED NOT DETECTED Final   Proteus species NOT DETECTED NOT DETECTED Final   Salmonella species NOT DETECTED NOT DETECTED Final   Serratia marcescens NOT DETECTED NOT DETECTED Final   Haemophilus influenzae NOT DETECTED NOT DETECTED Final   Neisseria meningitidis NOT DETECTED NOT DETECTED Final   Pseudomonas aeruginosa NOT DETECTED NOT DETECTED Final   Stenotrophomonas maltophilia NOT DETECTED NOT DETECTED Final   Candida albicans NOT DETECTED NOT DETECTED Final   Candida auris NOT DETECTED NOT DETECTED Final   Candida glabrata NOT DETECTED  NOT DETECTED Final   Candida krusei NOT DETECTED NOT DETECTED Final   Candida parapsilosis NOT DETECTED NOT DETECTED Final   Candida tropicalis NOT DETECTED NOT DETECTED Final   Cryptococcus neoformans/gattii NOT DETECTED NOT DETECTED Final   CTX-M ESBL NOT DETECTED NOT DETECTED Final   Carbapenem resistance IMP NOT DETECTED NOT DETECTED Final   Carbapenem resistance KPC NOT DETECTED NOT DETECTED Final   Carbapenem resistance NDM NOT DETECTED NOT DETECTED Final   Carbapenem resist OXA 48 LIKE NOT DETECTED NOT DETECTED Final   Carbapenem resistance VIM NOT DETECTED NOT DETECTED Final    Comment: Performed at [facility], [address], [place], [place] [address]  Blood Culture (routine x 2)     Status: Abnormal (Preliminary result)   Collection Time: 05/12/21  9:37 PM   Specimen: BLOOD  Result Value Ref Range Status   Specimen Description BLOOD SITE NOT SPECIFIED  Final   Special Requests   Final    BOTTLES DRAWN AEROBIC AND ANAEROBIC Blood Culture adequate volume   Culture  Setup Time (A)  Final    GRAM VARIABLE ROD AEROBIC BOTTLE ONLY CRITICAL VALUE NOTED.  VALUE IS CONSISTENT WITH PREVIOUSLY REPORTED AND CALLED VALUE.    Culture (A)  Final    GRAM VARIABLE ROD IDENTIFICATION TO FOLLOW Performed at [facility], [place] [address] [place], [place] [address]    Report Status PENDING  Incomplete  Urine Culture     Status: None   Collection Time: 05/13/21  3:06 AM   Specimen: Urine, Clean Catch  Result Value Ref Range Status   Specimen Description URINE, CLEAN CATCH  Final   Special Requests NONE  Final   Culture   Final    NO GROWTH Performed at [facility], [place] [address], [place], [place] [address]    Report Status 05/14/2021 FINAL  Final         Radiology Studies: DG Chest 1 View  Result Date: 05/12/2021 CLINICAL DATA:  The technologist did not provide a history. EXAM: CHEST  1 VIEW COMPARISON:  PA Lat 04/13/2021 FINDINGS: The heart size and  mediastinal contours are within normal limits apart from mild aortic tortuosity. Both lungs are clear. The visualized skeletal structures are unremarkable aside from spinal bridging enthesopathy of DISH. IMPRESSION: No evidence of acute chest disease or interval changes. Electronically Signed   By: Telford Nab M.D.   On: 05/12/2021 21:29   CT Angio Chest Pulmonary Embolism (PE) W or WO Contrast  Result Date: 05/12/2021 CLINICAL DATA:  Tremors and sepsis. EXAM: CT ANGIOGRAPHY CHEST WITH CONTRAST TECHNIQUE: Multidetector CT imaging of the chest was performed using the standard protocol during bolus administration of intravenous contrast. Multiplanar CT image reconstructions and MIPs were obtained to evaluate the vascular anatomy. CONTRAST:  114m OMNIPAQUE IOHEXOL 350 MG/ML SOLN COMPARISON:  Oct 20, 2015 FINDINGS: Cardiovascular: A small amount of intraluminal low attenuation is seen within a subsegmental lower lobe branch of the left pulmonary artery (axial CT images 151 through 158, CT series 5/coronal reformatted images 88 through 91, CT series 8). Normal heart size. No pericardial effusion. Mediastinum/Nodes: No enlarged mediastinal, hilar, or axillary lymph nodes. Thyroid gland, trachea, and esophagus demonstrate no significant findings. Lungs/Pleura: Mild atelectatic changes are seen within the posterolateral aspect of the left upper lobe. A 1.1 cm noncalcified lung nodule is seen within the posterior aspect of the left upper lobe (axial CT image 75, CT series 4). This represents a new finding when compared to the prior exam. There is no evidence of a pleural effusion or pneumothorax. Upper Abdomen: No acute abnormality. Musculoskeletal: Multilevel degenerative changes seen throughout the thoracic spine. Review of the MIP images confirms the above findings. IMPRESSION: 1. Small amount of pulmonary embolism within a subsegmental lower lobe branch of the left pulmonary artery. 2. 1.1 cm noncalcified lung  nodule within the posterior aspect of the left upper lobe. This represents a new finding when compared to the prior exam. Consider one of the following in 3 months for both low-risk and high-risk individuals: (a) repeat chest CT, (b) follow-up PET-CT, or (c) tissue sampling. This recommendation  follows the consensus statement: Guidelines for Management of Incidental Pulmonary Nodules Detected on CT Images: From the Fleischner Society 2017; Radiology 2017; 284:228-243. Electronically Signed   By: Thaddeus  Houston M.D.   On: 05/12/2021 23:26   CT ABDOMEN PELVIS W CONTRAST  Result Date: 05/12/2021 CLINICAL DATA:  Abdominal pain and fevers EXAM: CT ABDOMEN AND PELVIS WITH CONTRAST TECHNIQUE: Multidetector CT imaging of the abdomen and pelvis was performed using the standard protocol following bolus administration of intravenous contrast. CONTRAST:  100mL OMNIPAQUE IOHEXOL 350 MG/ML SOLN COMPARISON:  04/03/2021 FINDINGS: Lower chest: No acute abnormality. Hepatobiliary: Gallbladder is partially distended. Significant Peri cholecystic inflammatory changes are noted with similar findings in the adjacent hepatic tissue. These changes have increased in the interval from the prior exam and again are highly suspicious for acute cholecystitis. Prominence of the common bile duct is again seen and stable. Pancreas: Pancreas appears within normal limits. Spleen: Normal in size without focal abnormality. Adrenals/Urinary Tract: Adrenal glands are within normal limits. Kidneys again demonstrate a enhancing mass lesion within the substance of the right kidney measuring up to 3.3 cm suspicious for renal cell carcinoma and stable in appearance from the prior exam. Urologic consultation is recommended. No obstructive changes are seen. Stable renal cysts are noted in the right kidney. The bladder is decompressed. Stomach/Bowel: Diverticular change of the colon is noted without evidence of diverticulitis. The appendix is barium  filled and within normal limits. Small bowel and stomach are unremarkable. Vascular/Lymphatic: Atherosclerotic calcifications of the aorta are noted. Normal tapering is seen. No aneurysm is noted. Some upper abdominal peripancreatic lymph nodes are seen which appears centrally necrotic. The overall appearance is stable from the prior study. Additional node located between the head of the pancreas and the left lobe of the liver is noted as well also stable in appearance. Persistent soft tissue density is noted surrounding proximal superior vena cava. Reproductive: Prostate appears within normal limits. Other: No abdominal wall hernia or abnormality. No abdominopelvic ascites. Musculoskeletal: Degenerative changes of lumbar spine are noted. IMPRESSION: Increase in the inflammatory changes surrounding the gallbladder and extending into the adjacent liver again suspicious for acute cholecystitis with localized hepatic inflammatory change. Correlate with pending ultrasound exam Upper abdominal lymphadenopathy which appears necrotic and stable in appearance from the prior exam. Patient is scheduled for PET-CT according to outpatient clinic notes. Stable enhancing right renal mass suspicious for renal cell carcinoma. By history the patient has met with urology. Remainder of the exam is stable from the prior study. Electronically Signed   By: Mark  Lukens M.D.   On: 05/12/2021 23:34   MR 3D Recon At Scanner  Result Date: 05/13/2021 CLINICAL DATA:  Abdominal pain. Biliary obstruction suspected. Rigors. Nausea and vomiting. Diarrhea for 2 weeks. Weight loss. EXAM: MRI ABDOMEN WITHOUT AND WITH CONTRAST (INCLUDING MRCP) TECHNIQUE: Multiplanar multisequence MR imaging of the abdomen was performed both before and after the administration of intravenous contrast. Heavily T2-weighted images of the biliary and pancreatic ducts were obtained, and three-dimensional MRCP images were rendered by post processing. CONTRAST:  7.5mL  GADAVIST GADOBUTROL 1 MMOL/ML IV SOLN COMPARISON:  Ultrasound and CT of 1 day prior. FINDINGS: Portions of exam are mild to moderately motion degraded. Lower chest: New right base airspace disease since yesterday's CT. Mild cardiomegaly, without pericardial or pleural effusion. Hepatobiliary: Dependent small gallstones. The gallbladder is contracted with apparent mild gallbladder wall thickening. Within the pericholecystic right liver lobe, a 3.5 x 3.2 cm centrally hypoenhancing and mildly peripherally hyperenhancing lesion   is seen, including on 71/1104. Mildly T2 hypointense with mildly restricted diffusion. New since 04/03/2021. Mild intrahepatic biliary duct dilatation. The common duct is chronically dilated at 11 mm on 22/2. Followed to just above the level of the ampulla. On some images, including the dedicated MRCP sequence, a distal common duct filling defect is possible. Example at 6 mm 85/6. Pancreas: Pancreatic atrophy, without duct dilatation. Nonspecific edema within the left anterior pararenal space including on 18/4. Spleen:  Normal in size, without focal abnormality. Adrenals/Urinary Tract: Normal adrenal glands. Bilateral renal cysts. Arterially hyperenhancing interpolar right renal 2.8 x 2.6 cm lesion on 90/1101. No hydronephrosis. Stomach/Bowel: Grossly normal stomach and abdominal bowel loops. Vascular/Lymphatic:  Aortic atherosclerosis.  Patent renal veins. Again identified is necrotic adenopathy within the upper abdomen. Example gastrohepatic ligament node of 1.7 cm on 65/1102. A portacaval 11 mm necrotic node on 74/1103 Other:  No ascites. Musculoskeletal: No acute osseous abnormality. IMPRESSION: 1. Motion degraded images. 2. Cholelithiasis. Suggestion of wall thickening in the setting of underdistention. Development of a pericholecystic liver lesion which is suspicious for extension of chronic cholecystitis. Direct extension of gallbladder carcinoma felt less likely, given absence of dominant  gallbladder mass. 3. Upper abdominal necrotic adenopathy, present back to 04/03/2021. Outpatient PET may be informative to direct sampling. 4. Mild intra and extrahepatic biliary duct dilatation. Equivocal findings for distal common duct stone, given above limitations. If bilirubin is elevated, consider ERCP. 5. Interpolar right renal enhancing mass, consistent with renal cell carcinoma. 6. Mild edema within the anterior pararenal space. Correlate with pancreatic enzyme levels to exclude mild pancreatitis. 7. Right base airspace disease, new since yesterday's CT. Favor atelectasis. Electronically Signed   By: Abigail Miyamoto M.D.   On: 05/13/2021 13:31   MR ABDOMEN MRCP W WO CONTAST  Result Date: 05/13/2021 CLINICAL DATA:  Abdominal pain. Biliary obstruction suspected. Rigors. Nausea and vomiting. Diarrhea for 2 weeks. Weight loss. EXAM: MRI ABDOMEN WITHOUT AND WITH CONTRAST (INCLUDING MRCP) TECHNIQUE: Multiplanar multisequence MR imaging of the abdomen was performed both before and after the administration of intravenous contrast. Heavily T2-weighted images of the biliary and pancreatic ducts were obtained, and three-dimensional MRCP images were rendered by post processing. CONTRAST:  7.28m GADAVIST GADOBUTROL 1 MMOL/ML IV SOLN COMPARISON:  Ultrasound and CT of 1 day prior. FINDINGS: Portions of exam are mild to moderately motion degraded. Lower chest: New right base airspace disease since yesterday's CT. Mild cardiomegaly, without pericardial or pleural effusion. Hepatobiliary: Dependent small gallstones. The gallbladder is contracted with apparent mild gallbladder wall thickening. Within the pericholecystic right liver lobe, a 3.5 x 3.2 cm centrally hypoenhancing and mildly peripherally hyperenhancing lesion is seen, including on 71/1104. Mildly T2 hypointense with mildly restricted diffusion. New since 04/03/2021. Mild intrahepatic biliary duct dilatation. The common duct is chronically dilated at 11 mm on  22/2. Followed to just above the level of the ampulla. On some images, including the dedicated MRCP sequence, a distal common duct filling defect is possible. Example at 6 mm 85/6. Pancreas: Pancreatic atrophy, without duct dilatation. Nonspecific edema within the left anterior pararenal space including on 18/4. Spleen:  Normal in size, without focal abnormality. Adrenals/Urinary Tract: Normal adrenal glands. Bilateral renal cysts. Arterially hyperenhancing interpolar right renal 2.8 x 2.6 cm lesion on 90/1101. No hydronephrosis. Stomach/Bowel: Grossly normal stomach and abdominal bowel loops. Vascular/Lymphatic:  Aortic atherosclerosis.  Patent renal veins. Again identified is necrotic adenopathy within the upper abdomen. Example gastrohepatic ligament node of 1.7 cm on 65/1102. A portacaval 11 mm  necrotic node on 74/1103 Other:  No ascites. Musculoskeletal: No acute osseous abnormality. IMPRESSION: 1. Motion degraded images. 2. Cholelithiasis. Suggestion of wall thickening in the setting of underdistention. Development of a pericholecystic liver lesion which is suspicious for extension of chronic cholecystitis. Direct extension of gallbladder carcinoma felt less likely, given absence of dominant gallbladder mass. 3. Upper abdominal necrotic adenopathy, present back to 04/03/2021. Outpatient PET may be informative to direct sampling. 4. Mild intra and extrahepatic biliary duct dilatation. Equivocal findings for distal common duct stone, given above limitations. If bilirubin is elevated, consider ERCP. 5. Interpolar right renal enhancing mass, consistent with renal cell carcinoma. 6. Mild edema within the anterior pararenal space. Correlate with pancreatic enzyme levels to exclude mild pancreatitis. 7. Right base airspace disease, new since yesterday's CT. Favor atelectasis. Electronically Signed   By: Kyle  Talbot M.D.   On: 05/13/2021 13:31   ECHOCARDIOGRAM COMPLETE  Result Date: 05/13/2021    ECHOCARDIOGRAM  REPORT   Patient Name:   Andre [name] Date of Exam: 05/13/2021 Medical Rec #:  [id number]   Height:       71.0 in Accession #:    [id number]  Weight:       165.0 lb Date of Birth:  09/01/1940   BSA:          1.943 m Patient Age:    80 years    BP:           101/70 mmHg Patient Gender: M           HR:           74 bpm. Exam Location:  Inpatient Procedure: 2D Echo, Cardiac Doppler, Color Doppler and Intracardiac            Opacification Agent Indications:    Pulmonary emblous  History:        Patient has no prior history of Echocardiogram examinations.                 CAD, COPD; Risk Factors:Hypertension.  Sonographer:    Brittney Adkins Referring Phys: [id number] ASIA B ZIERLE-GHOSH IMPRESSIONS  1. Left ventricular ejection fraction, by estimation, is 40 to 45%. The left ventricle has mildly decreased function. The left ventricle demonstrates global hypokinesis. Left ventricular diastolic parameters are indeterminate.  2. Right ventricular systolic function is normal. The right ventricular size is mildly enlarged. Tricuspid regurgitation signal is inadequate for assessing PA pressure.  3. Right atrial size was mildly dilated.  4. The mitral valve is normal in structure. Trivial mitral valve regurgitation. No evidence of mitral stenosis.  5. The aortic valve is tricuspid. Aortic valve regurgitation is not visualized. No aortic stenosis is present.  6. The inferior vena cava is dilated in size with >50% respiratory variability, suggesting right atrial pressure of 8 mmHg. FINDINGS  Left Ventricle: Left ventricular ejection fraction, by estimation, is 40 to 45%. The left ventricle has mildly decreased function. The left ventricle demonstrates global hypokinesis. The left ventricular internal cavity size was normal in size. There is  no left ventricular hypertrophy. Left ventricular diastolic parameters are indeterminate. Right Ventricle: The right ventricular size is mildly enlarged. No increase in right ventricular wall  thickness. Right ventricular systolic function is normal. Tricuspid regurgitation signal is inadequate for assessing PA pressure. Left Atrium: Left atrial size was normal in size. Right Atrium: Right atrial size was mildly dilated. Pericardium: There is no evidence of pericardial effusion. Mitral Valve: The mitral valve is normal in structure. Trivial mitral valve regurgitation.   No evidence of mitral valve stenosis. Tricuspid Valve: The tricuspid valve is normal in structure. Tricuspid valve regurgitation is not demonstrated. Aortic Valve: The aortic valve is tricuspid. Aortic valve regurgitation is not visualized. No aortic stenosis is present. Aortic valve mean gradient measures 1.0 mmHg. Aortic valve peak gradient measures 2.7 mmHg. Aortic valve area, by VTI measures 3.05 cm. Pulmonic Valve: The pulmonic valve was not well visualized. Pulmonic valve regurgitation is not visualized. Aorta: The aortic root is normal in size and structure. Venous: The inferior vena cava is dilated in size with greater than 50% respiratory variability, suggesting right atrial pressure of 8 mmHg. IAS/Shunts: The interatrial septum was not well visualized.  LEFT VENTRICLE PLAX 2D LVIDd:         4.70 cm      Diastology LVIDs:         3.00 cm      LV e' medial:    7.18 cm/s LV PW:         0.90 cm      LV E/e' medial:  8.4 LV IVS:        0.90 cm      LV e' lateral:   8.92 cm/s LVOT diam:     2.10 cm      LV E/e' lateral: 6.8 LV SV:         52 LV SV Index:   27 LVOT Area:     3.46 cm  LV Volumes (MOD) LV vol d, MOD A2C: 117.0 ml LV vol d, MOD A4C: 129.0 ml LV vol s, MOD A2C: 73.4 ml LV vol s, MOD A4C: 81.0 ml LV SV MOD A2C:     43.6 ml LV SV MOD A4C:     129.0 ml LV SV MOD BP:      44.8 ml RIGHT VENTRICLE             IVC RV Basal diam:  4.20 cm     IVC diam: 2.40 cm RV S prime:     11.90 cm/s LEFT ATRIUM           Index        RIGHT ATRIUM           Index LA diam:      2.60 cm 1.34 cm/m   RA Area:     23.40 cm LA Vol (A2C): 35.8 ml  18.42 ml/m  RA Volume:   74.30 ml  38.24 ml/m LA Vol (A4C): 58.1 ml 29.90 ml/m  AORTIC VALVE AV Area (Vmax):    3.12 cm AV Area (Vmean):   3.01 cm AV Area (VTI):     3.05 cm AV Vmax:           81.90 cm/s AV Vmean:          55.200 cm/s AV VTI:            0.169 m AV Peak Grad:      2.7 mmHg AV Mean Grad:      1.0 mmHg LVOT Vmax:         73.80 cm/s LVOT Vmean:        47.900 cm/s LVOT VTI:          0.149 m LVOT/AV VTI ratio: 0.88  AORTA Ao Root diam: 3.40 cm MITRAL VALVE MV Area (PHT): 3.83 cm    SHUNTS MV Decel Time: 198 msec    Systemic VTI:  0.15 m MV E velocity: 60.40 cm/s  Systemic Diam: 2.10 cm MV   A velocity: 75.00 cm/s MV E/A ratio:  0.81 Christopher Schumann MD Electronically signed by Christopher Schumann MD Signature Date/Time: 05/13/2021/4:24:59 PM    Final    VAS US LOWER EXTREMITY VENOUS (DVT)  Result Date: 05/13/2021  Lower Venous DVT Study Patient Name:  Remiel Ayoub  Date of Exam:   05/13/2021 Medical Rec #: [id number]    Accession #:    [id number] Date of Birth: 02/07/1941    Patient Gender: M Patient Age:   80 years Exam Location:  [facility] Procedure:      VAS US LOWER EXTREMITY VENOUS (DVT) Referring Phys: ASIA ZIERLE-GHOSH --------------------------------------------------------------------------------  Indications: Pulmonary embolism.  Comparison Study: No previous exams Performing Technologist: Jody Hill RVT, RDMS  Examination Guidelines: A complete evaluation includes B-mode imaging, spectral Doppler, color Doppler, and power Doppler as needed of all accessible portions of each vessel. Bilateral testing is considered an integral part of a complete examination. Limited examinations for reoccurring indications may be performed as noted. The reflux portion of the exam is performed with the patient in reverse Trendelenburg.  +---------+---------------+---------+-----------+----------+--------------+ RIGHT    CompressibilityPhasicitySpontaneityPropertiesThrombus Aging  +---------+---------------+---------+-----------+----------+--------------+ CFV      Full           Yes      Yes                                 +---------+---------------+---------+-----------+----------+--------------+ SFJ      Full                                                        +---------+---------------+---------+-----------+----------+--------------+ FV Prox  Full           Yes      Yes                                 +---------+---------------+---------+-----------+----------+--------------+ FV Mid   Full           Yes      Yes                                 +---------+---------------+---------+-----------+----------+--------------+ FV DistalFull           Yes      Yes                                 +---------+---------------+---------+-----------+----------+--------------+ PFV      Full                                                        +---------+---------------+---------+-----------+----------+--------------+ POP      Full           Yes      Yes                                 +---------+---------------+---------+-----------+----------+--------------+ PTV        Full                                                        +---------+---------------+---------+-----------+----------+--------------+ PERO     Full                                                        +---------+---------------+---------+-----------+----------+--------------+   +---------+---------------+---------+-----------+----------+--------------+ LEFT     CompressibilityPhasicitySpontaneityPropertiesThrombus Aging +---------+---------------+---------+-----------+----------+--------------+ CFV      Full           Yes      Yes                                 +---------+---------------+---------+-----------+----------+--------------+ SFJ      Full                                                         +---------+---------------+---------+-----------+----------+--------------+ FV Prox  Full           Yes      Yes                                 +---------+---------------+---------+-----------+----------+--------------+ FV Mid   Full           Yes      Yes                                 +---------+---------------+---------+-----------+----------+--------------+ FV DistalFull           Yes      Yes                                 +---------+---------------+---------+-----------+----------+--------------+ PFV      Full                                                        +---------+---------------+---------+-----------+----------+--------------+ POP      Full           Yes      Yes                                 +---------+---------------+---------+-----------+----------+--------------+ PTV      Full                                                        +---------+---------------+---------+-----------+----------+--------------+ PERO  Full                                                        +---------+---------------+---------+-----------+----------+--------------+     Summary: RIGHT: - There is no evidence of deep vein thrombosis in the lower extremity.  LEFT: - There is no evidence of deep vein thrombosis in the lower extremity.  *See table(s) above for measurements and observations. Electronically signed by Deitra Mayo MD on 05/13/2021 at 11:58:47 AM.    Final    US Abdomen Limited RUQ (LIVER/GB)  Result Date: 05/12/2021 CLINICAL DATA:  Fevers EXAM: ULTRASOUND ABDOMEN LIMITED RIGHT UPPER QUADRANT COMPARISON:  CT from earlier in the same day FINDINGS: Gallbladder: Gallbladder again shows evidence of cholelithiasis with gallbladder wall thickening and mild pericholecystic fluid. Negative sonographic Murphy's sign is elicited however. The degree of inflammatory change in the adjacent liver is less well appreciated on this exam than on recent CT.  Common bile duct: Diameter: 6.4 mm Liver: No focal lesion identified. Within normal limits in parenchymal echogenicity. Portal vein is patent on color Doppler imaging with normal direction of blood flow towards the liver. Other: None. IMPRESSION: Stable changes in the gallbladder with cholelithiasis and gallbladder wall thickening. Negative sonographic Murphy's sign is elicited however. This may be in part due to underlying medications. Electronically Signed   By: Inez Catalina M.D.   On: 05/12/2021 23:36        Scheduled Meds:  aspirin EC  81 mg Oral Daily   atorvastatin  20 mg Oral Daily   donepezil  10 mg Oral QHS   enoxaparin (LOVENOX) injection  75 mg Subcutaneous Q12H   memantine  10 mg Oral BID   Continuous Infusions:  cefTRIAXone (ROCEPHIN)  IV     lactated ringers 75 mL/hr at 05/13/21 2054   metronidazole       LOS: 1 day      Phillips Climes, MD [facility]   To contact the attending provider between 7A-7P or the covering provider during after hours 7P-7A, please log into the web site [email or web address] and access using universal [place] password for that web site. If you do not have the password, please call the hospital operator.  05/14/2021, 2:29 PM

## 2021-05-15 LAB — CULTURE, BLOOD (ROUTINE X 2): Special Requests: ADEQUATE

## 2021-05-15 LAB — BASIC METABOLIC PANEL
Anion gap: 8 (ref 5–15)
BUN: 7 mg/dL — ABNORMAL LOW (ref 8–23)
CO2: 25 mmol/L (ref 22–32)
Calcium: 8.1 mg/dL — ABNORMAL LOW (ref 8.9–10.3)
Chloride: 98 mmol/L (ref 98–111)
Creatinine, Ser: 0.83 mg/dL (ref 0.61–1.24)
GFR, Estimated: >60 mL/min (ref >60)
Glucose, Bld: 86 mg/dL (ref 70–99)
Potassium: 3.1 mmol/L — ABNORMAL LOW (ref 3.5–5.1)
Sodium: 131 mmol/L — ABNORMAL LOW (ref 135–145)

## 2021-05-15 LAB — CBC
HCT: 38.8 % — ABNORMAL LOW (ref 39.0–52.0)
Hemoglobin: 12.8 g/dL — ABNORMAL LOW (ref 13.0–17.0)
MCH: 29.4 pg (ref 26.0–34.0)
MCHC: 33 g/dL (ref 30.0–36.0)
MCV: 89.2 fL (ref 80.0–100.0)
Platelets: 146 10*3/uL — ABNORMAL LOW (ref 150–400)
RBC: 4.35 MIL/uL (ref 4.22–5.81)
RDW: 15.1 % (ref 11.5–15.5)
WBC: 8 10*3/uL (ref 4.0–10.5)
nRBC: 0 % (ref 0.0–0.2)

## 2021-05-15 MED ORDER — POTASSIUM CHLORIDE CRYS ER 20 MEQ PO TBCR
40.0000 meq | EXTENDED_RELEASE_TABLET | Freq: Four times a day (QID) | ORAL | Status: AC
  Administered 2021-05-15 (×2): 40 meq via ORAL
  Filled 2021-05-15 (×2): qty 2

## 2021-05-15 MED ORDER — SENNOSIDES-DOCUSATE SODIUM 8.6-50 MG PO TABS
2.0000 | ORAL_TABLET | Freq: Two times a day (BID) | ORAL | Status: DC
  Administered 2021-05-15 – 2021-05-21 (×10): 2 via ORAL
  Filled 2021-05-15 (×11): qty 2

## 2021-05-15 NOTE — Progress Notes (Signed)
Subjective/Chief Complaint: Tolerating diet.  Denies abdominal pain.   Objective: Vital signs in last 24 hours: Temp:  [97.8 F (36.6 C)-98.4 F (36.9 C)] 98.2 F (36.8 C) (12/04 0754) Pulse Rate:  [78-102] 85 (12/04 0754) Resp:  [18-20] 20 (12/04 0754) BP: (99-127)/(54-71) 120/71 (12/04 0754) SpO2:  [94 %-99 %] 98 % (12/04 0754) Weight:  [73.2 kg] 73.2 kg (12/03 0906) Last BM Date: 05/14/21  Intake/Output from previous day: 12/03 0701 - 12/04 0700 In: 50 [P.O.:50] Out: 100 [Urine:100] Intake/Output this shift: No intake/output data recorded.   General appearance: alert and cooperative Resp: clear to auscultation bilaterally Cardio: regular rate and rhythm GI: soft, non-tender; bowel sounds normal; no masses,  no organomegaly Neurologic: Grossly normal Lab Results:  Recent Labs    05/14/21 0632 05/15/21 0107  WBC 8.6 8.0  HGB 13.5 12.8*  HCT 40.1 38.8*  PLT 150 146*   BMET Recent Labs    05/14/21 0632 05/15/21 0107  NA 131* 131*  K 3.7 3.1*  CL 100 98  CO2 23 25  GLUCOSE 96 86  BUN 7* 7*  CREATININE 0.88 0.83  CALCIUM 8.6* 8.1*   PT/INR Recent Labs    05/12/21 2137 05/13/21 0508  LABPROT 14.7 16.0*  INR 1.1 1.3*   ABG No results for input(s): PHART, HCO3 in the last 72 hours.  Invalid input(s): PCO2, PO2  Studies/Results: MR 3D Recon At Scanner  Result Date: 05/13/2021 CLINICAL DATA:  Abdominal pain. Biliary obstruction suspected. Rigors. Nausea and vomiting. Diarrhea for 2 weeks. Weight loss. EXAM: MRI ABDOMEN WITHOUT AND WITH CONTRAST (INCLUDING MRCP) TECHNIQUE: Multiplanar multisequence MR imaging of the abdomen was performed both before and after the administration of intravenous contrast. Heavily T2-weighted images of the biliary and pancreatic ducts were obtained, and three-dimensional MRCP images were rendered by post processing. CONTRAST:  7.72mL GADAVIST GADOBUTROL 1 MMOL/ML IV SOLN COMPARISON:  Ultrasound and CT of 1 day prior.  FINDINGS: Portions of exam are mild to moderately motion degraded. Lower chest: New right base airspace disease since yesterday's CT. Mild cardiomegaly, without pericardial or pleural effusion. Hepatobiliary: Dependent small gallstones. The gallbladder is contracted with apparent mild gallbladder wall thickening. Within the pericholecystic right liver lobe, a 3.5 x 3.2 cm centrally hypoenhancing and mildly peripherally hyperenhancing lesion is seen, including on 71/1104. Mildly T2 hypointense with mildly restricted diffusion. New since 04/03/2021. Mild intrahepatic biliary duct dilatation. The common duct is chronically dilated at 11 mm on 22/2. Followed to just above the level of the ampulla. On some images, including the dedicated MRCP sequence, a distal common duct filling defect is possible. Example at 6 mm 85/6. Pancreas: Pancreatic atrophy, without duct dilatation. Nonspecific edema within the left anterior pararenal space including on 18/4. Spleen:  Normal in size, without focal abnormality. Adrenals/Urinary Tract: Normal adrenal glands. Bilateral renal cysts. Arterially hyperenhancing interpolar right renal 2.8 x 2.6 cm lesion on 90/1101. No hydronephrosis. Stomach/Bowel: Grossly normal stomach and abdominal bowel loops. Vascular/Lymphatic:  Aortic atherosclerosis.  Patent renal veins. Again identified is necrotic adenopathy within the upper abdomen. Example gastrohepatic ligament node of 1.7 cm on 65/1102. A portacaval 11 mm necrotic node on 74/1103 Other:  No ascites. Musculoskeletal: No acute osseous abnormality. IMPRESSION: 1. Motion degraded images. 2. Cholelithiasis. Suggestion of wall thickening in the setting of underdistention. Development of a pericholecystic liver lesion which is suspicious for extension of chronic cholecystitis. Direct extension of gallbladder carcinoma felt less likely, given absence of dominant gallbladder mass. 3. Upper abdominal necrotic adenopathy,  present back to  04/03/2021. Outpatient PET may be informative to direct sampling. 4. Mild intra and extrahepatic biliary duct dilatation. Equivocal findings for distal common duct stone, given above limitations. If bilirubin is elevated, consider ERCP. 5. Interpolar right renal enhancing mass, consistent with renal cell carcinoma. 6. Mild edema within the anterior pararenal space. Correlate with pancreatic enzyme levels to exclude mild pancreatitis. 7. Right base airspace disease, new since yesterday's CT. Favor atelectasis. Electronically Signed   By: Abigail Miyamoto M.D.   On: 05/13/2021 13:31   MR ABDOMEN MRCP W WO CONTAST  Result Date: 05/13/2021 CLINICAL DATA:  Abdominal pain. Biliary obstruction suspected. Rigors. Nausea and vomiting. Diarrhea for 2 weeks. Weight loss. EXAM: MRI ABDOMEN WITHOUT AND WITH CONTRAST (INCLUDING MRCP) TECHNIQUE: Multiplanar multisequence MR imaging of the abdomen was performed both before and after the administration of intravenous contrast. Heavily T2-weighted images of the biliary and pancreatic ducts were obtained, and three-dimensional MRCP images were rendered by post processing. CONTRAST:  7.53mL GADAVIST GADOBUTROL 1 MMOL/ML IV SOLN COMPARISON:  Ultrasound and CT of 1 day prior. FINDINGS: Portions of exam are mild to moderately motion degraded. Lower chest: New right base airspace disease since yesterday's CT. Mild cardiomegaly, without pericardial or pleural effusion. Hepatobiliary: Dependent small gallstones. The gallbladder is contracted with apparent mild gallbladder wall thickening. Within the pericholecystic right liver lobe, a 3.5 x 3.2 cm centrally hypoenhancing and mildly peripherally hyperenhancing lesion is seen, including on 71/1104. Mildly T2 hypointense with mildly restricted diffusion. New since 04/03/2021. Mild intrahepatic biliary duct dilatation. The common duct is chronically dilated at 11 mm on 22/2. Followed to just above the level of the ampulla. On some images,  including the dedicated MRCP sequence, a distal common duct filling defect is possible. Example at 6 mm 85/6. Pancreas: Pancreatic atrophy, without duct dilatation. Nonspecific edema within the left anterior pararenal space including on 18/4. Spleen:  Normal in size, without focal abnormality. Adrenals/Urinary Tract: Normal adrenal glands. Bilateral renal cysts. Arterially hyperenhancing interpolar right renal 2.8 x 2.6 cm lesion on 90/1101. No hydronephrosis. Stomach/Bowel: Grossly normal stomach and abdominal bowel loops. Vascular/Lymphatic:  Aortic atherosclerosis.  Patent renal veins. Again identified is necrotic adenopathy within the upper abdomen. Example gastrohepatic ligament node of 1.7 cm on 65/1102. A portacaval 11 mm necrotic node on 74/1103 Other:  No ascites. Musculoskeletal: No acute osseous abnormality. IMPRESSION: 1. Motion degraded images. 2. Cholelithiasis. Suggestion of wall thickening in the setting of underdistention. Development of a pericholecystic liver lesion which is suspicious for extension of chronic cholecystitis. Direct extension of gallbladder carcinoma felt less likely, given absence of dominant gallbladder mass. 3. Upper abdominal necrotic adenopathy, present back to 04/03/2021. Outpatient PET may be informative to direct sampling. 4. Mild intra and extrahepatic biliary duct dilatation. Equivocal findings for distal common duct stone, given above limitations. If bilirubin is elevated, consider ERCP. 5. Interpolar right renal enhancing mass, consistent with renal cell carcinoma. 6. Mild edema within the anterior pararenal space. Correlate with pancreatic enzyme levels to exclude mild pancreatitis. 7. Right base airspace disease, new since yesterday's CT. Favor atelectasis. Electronically Signed   By: Abigail Miyamoto M.D.   On: 05/13/2021 13:31   ECHOCARDIOGRAM COMPLETE  Result Date: 05/13/2021    ECHOCARDIOGRAM REPORT   Patient Name:   Andre [name] Date of Exam: 05/13/2021 Medical Rec  #:  [id number]   Height:       71.0 in Accession #:    [id number]  Weight:       165.0  lb Date of Birth:  1940-08-07   BSA:          1.943 m Patient Age:    63 years    BP:           101/70 mmHg Patient Gender: M           HR:           74 bpm. Exam Location:  Inpatient Procedure: 2D Echo, Cardiac Doppler, Color Doppler and Intracardiac            Opacification Agent Indications:    Pulmonary emblous  History:        Patient has no prior history of Echocardiogram examinations.                 CAD, COPD; Risk Factors:Hypertension.  Sonographer:    Glo Herring Referring Phys: [id number] [place]  1. Left ventricular ejection fraction, by estimation, is 40 to 45%. The left ventricle has mildly decreased function. The left ventricle demonstrates global hypokinesis. Left ventricular diastolic parameters are indeterminate.  2. Right ventricular systolic function is normal. The right ventricular size is mildly enlarged. Tricuspid regurgitation signal is inadequate for assessing PA pressure.  3. Right atrial size was mildly dilated.  4. The mitral valve is normal in structure. Trivial mitral valve regurgitation. No evidence of mitral stenosis.  5. The aortic valve is tricuspid. Aortic valve regurgitation is not visualized. No aortic stenosis is present.  6. The inferior vena cava is dilated in size with >50% respiratory variability, suggesting right atrial pressure of 8 mmHg. FINDINGS  Left Ventricle: Left ventricular ejection fraction, by estimation, is 40 to 45%. The left ventricle has mildly decreased function. The left ventricle demonstrates global hypokinesis. The left ventricular internal cavity size was normal in size. There is  no left ventricular hypertrophy. Left ventricular diastolic parameters are indeterminate. Right Ventricle: The right ventricular size is mildly enlarged. No increase in right ventricular wall thickness. Right ventricular systolic function is normal. Tricuspid  regurgitation signal is inadequate for assessing PA pressure. Left Atrium: Left atrial size was normal in size. Right Atrium: Right atrial size was mildly dilated. Pericardium: There is no evidence of pericardial effusion. Mitral Valve: The mitral valve is normal in structure. Trivial mitral valve regurgitation. No evidence of mitral valve stenosis. Tricuspid Valve: The tricuspid valve is normal in structure. Tricuspid valve regurgitation is not demonstrated. Aortic Valve: The aortic valve is tricuspid. Aortic valve regurgitation is not visualized. No aortic stenosis is present. Aortic valve mean gradient measures 1.0 mmHg. Aortic valve peak gradient measures 2.7 mmHg. Aortic valve area, by VTI measures 3.05 cm. Pulmonic Valve: The pulmonic valve was not well visualized. Pulmonic valve regurgitation is not visualized. Aorta: The aortic root is normal in size and structure. Venous: The inferior vena cava is dilated in size with greater than 50% respiratory variability, suggesting right atrial pressure of 8 mmHg. IAS/Shunts: The interatrial septum was not well visualized.  LEFT VENTRICLE PLAX 2D LVIDd:         4.70 cm      Diastology LVIDs:         3.00 cm      LV e' medial:    7.18 cm/s LV PW:         0.90 cm      LV E/e' medial:  8.4 LV IVS:        0.90 cm      LV e' lateral:   8.92 cm/s LVOT  diam:     2.10 cm      LV E/e' lateral: 6.8 LV SV:         52 LV SV Index:   27 LVOT Area:     3.46 cm  LV Volumes (MOD) LV vol d, MOD A2C: 117.0 ml LV vol d, MOD A4C: 129.0 ml LV vol s, MOD A2C: 73.4 ml LV vol s, MOD A4C: 81.0 ml LV SV MOD A2C:     43.6 ml LV SV MOD A4C:     129.0 ml LV SV MOD BP:      44.8 ml RIGHT VENTRICLE             IVC RV Basal diam:  4.20 cm     IVC diam: 2.40 cm RV S prime:     11.90 cm/s LEFT ATRIUM           Index        RIGHT ATRIUM           Index LA diam:      2.60 cm 1.34 cm/m   RA Area:     23.40 cm LA Vol (A2C): 35.8 ml 18.42 ml/m  RA Volume:   74.30 ml  38.24 ml/m LA Vol (A4C): 58.1 ml  29.90 ml/m  AORTIC VALVE AV Area (Vmax):    3.12 cm AV Area (Vmean):   3.01 cm AV Area (VTI):     3.05 cm AV Vmax:           81.90 cm/s AV Vmean:          55.200 cm/s AV VTI:            0.169 m AV Peak Grad:      2.7 mmHg AV Mean Grad:      1.0 mmHg LVOT Vmax:         73.80 cm/s LVOT Vmean:        47.900 cm/s LVOT VTI:          0.149 m LVOT/AV VTI ratio: 0.88  AORTA Ao Root diam: 3.40 cm MITRAL VALVE MV Area (PHT): 3.83 cm    SHUNTS MV Decel Time: 198 msec    Systemic VTI:  0.15 m MV E velocity: 60.40 cm/s  Systemic Diam: 2.10 cm MV A velocity: 75.00 cm/s MV E/A ratio:  0.81 Oswaldo Milian MD Electronically signed by Oswaldo Milian MD Signature Date/Time: 05/13/2021/4:24:59 PM    Final    VAS [place] LOWER EXTREMITY VENOUS (DVT)  Result Date: 05/13/2021  Lower Venous DVT Study Patient Name:  Andre [name]  Date of Exam:   05/13/2021 Medical Rec #: [id number]    Accession #:    [id number] Date of Birth: 1941/01/15    Patient Gender: M Patient Age:   80 years Exam Location:  [facility] Procedure:      VAS [place] LOWER EXTREMITY VENOUS (DVT) Referring Phys: ASIA ZIERLE-GHOSH --------------------------------------------------------------------------------  Indications: Pulmonary embolism.  Comparison Study: No previous exams Performing Technologist: Jody Hill RVT, RDMS  Examination Guidelines: A complete evaluation includes B-mode imaging, spectral Doppler, color Doppler, and power Doppler as needed of all accessible portions of each vessel. Bilateral testing is considered an integral part of a complete examination. Limited examinations for reoccurring indications may be performed as noted. The reflux portion of the exam is performed with the patient in reverse Trendelenburg.  +---------+---------------+---------+-----------+----------+--------------+ RIGHT    CompressibilityPhasicitySpontaneityPropertiesThrombus Aging +---------+---------------+---------+-----------+----------+--------------+ CFV       Full  Yes      Yes                                 +---------+---------------+---------+-----------+----------+--------------+ SFJ      Full                                                        +---------+---------------+---------+-----------+----------+--------------+ FV Prox  Full           Yes      Yes                                 +---------+---------------+---------+-----------+----------+--------------+ FV Mid   Full           Yes      Yes                                 +---------+---------------+---------+-----------+----------+--------------+ FV DistalFull           Yes      Yes                                 +---------+---------------+---------+-----------+----------+--------------+ PFV      Full                                                        +---------+---------------+---------+-----------+----------+--------------+ POP      Full           Yes      Yes                                 +---------+---------------+---------+-----------+----------+--------------+ PTV      Full                                                        +---------+---------------+---------+-----------+----------+--------------+ PERO     Full                                                        +---------+---------------+---------+-----------+----------+--------------+   +---------+---------------+---------+-----------+----------+--------------+ LEFT     CompressibilityPhasicitySpontaneityPropertiesThrombus Aging +---------+---------------+---------+-----------+----------+--------------+ CFV      Full           Yes      Yes                                 +---------+---------------+---------+-----------+----------+--------------+ SFJ      Full                                                        +---------+---------------+---------+-----------+----------+--------------+  FV Prox  Full           Yes      Yes                                  +---------+---------------+---------+-----------+----------+--------------+ FV Mid   Full           Yes      Yes                                 +---------+---------------+---------+-----------+----------+--------------+ FV DistalFull           Yes      Yes                                 +---------+---------------+---------+-----------+----------+--------------+ PFV      Full                                                        +---------+---------------+---------+-----------+----------+--------------+ POP      Full           Yes      Yes                                 +---------+---------------+---------+-----------+----------+--------------+ PTV      Full                                                        +---------+---------------+---------+-----------+----------+--------------+ PERO     Full                                                        +---------+---------------+---------+-----------+----------+--------------+     Summary: RIGHT: - There is no evidence of deep vein thrombosis in the lower extremity.  LEFT: - There is no evidence of deep vein thrombosis in the lower extremity.  *See table(s) above for measurements and observations. Electronically signed by Deitra Mayo MD on 05/13/2021 at 11:58:47 AM.    Final     Anti-infectives: Anti-infectives (From admission, onward)    Start     Dose/Rate Route Frequency Ordered Stop   05/14/21 1800  cefTRIAXone (ROCEPHIN) 2 g in sodium chloride 0.9 % 100 mL IVPB        2 g 200 mL/hr over 30 Minutes Intravenous Every 24 hours 05/14/21 1429     05/14/21 1600  metroNIDAZOLE (FLAGYL) IVPB 500 mg        500 mg 100 mL/hr over 60 Minutes Intravenous Every 8 hours 05/14/21 1429     05/13/21 0600  piperacillin-tazobactam (ZOSYN) IVPB 3.375 g  Status:  Discontinued        3.375 g 12.5 mL/hr over 240 Minutes Intravenous Every 8 hours 05/13/21 0305 05/14/21 1429   05/12/21 2130   piperacillin-tazobactam (  ZOSYN) IVPB 3.375 g        3.375 g 100 mL/hr over 30 Minutes Intravenous  Once 05/12/21 2124 05/12/21 2234   05/12/21 2115  ceFEPIme (MAXIPIME) 2 g in sodium chloride 0.9 % 100 mL IVPB  Status:  Discontinued        2 g 200 mL/hr over 30 Minutes Intravenous  Once 05/12/21 2114 05/12/21 2124   05/12/21 2115  metroNIDAZOLE (FLAGYL) IVPB 500 mg  Status:  Discontinued        500 mg 100 mL/hr over 60 Minutes Intravenous  Once 05/12/21 2114 05/12/21 2124   05/12/21 2115  vancomycin (VANCOREADY) IVPB 1500 mg/300 mL  Status:  Discontinued        1,500 mg 150 mL/hr over 120 Minutes Intravenous  Once 05/12/21 2114 05/12/21 2124       Assessment/Plan: Gallbladder wall thickening, questionable cholecystitis, questionable gallbladder mass/liver mass-clinically he is better today.  He is nontender.  Advance diet.  He is pain-free.  Primary team to follow-up tomorrow for further recommendations.  This may require further work-up but for right now continue anticoagulation.  No acute surgical indication.  No role for percutaneous cholecystostomy tube.  Continue antibiotics and treat medically for now.     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  Multiple retroperitoneal necrotic lymph nodes noted.  Patient is followed by urology.  No acute surgical indication for now.  May need work-up of gallbladder/mass and/or HIDA scan at some point but we will follow along.   LOS: 2 days    Turner Daniels  MD  05/15/2021

## 2021-05-15 NOTE — Progress Notes (Signed)
PROGRESS NOTE    Andre [name]  XHB:[id number] DOB: [birth year] DOA: 05/12/2021 PCP: Biagio Borg, MD    Chief Complaint  Patient presents with   Tremors   Code Sepsis    Brief Narrative:   Andre [name]  is a 80 y.o. male, with history of GERD, coronary artery disease, hypertension, rectal cancer, TIA, weakness, presents ED with a chief complaint of rigors, fever, patient endorses abdominal pain for few weeks now, his work-up in ED significant for acute cholecystitis.  Assessment & Plan:   Principal Problem:   Sepsis ([place]) Active Problems:   Essential hypertension, benign   GERD (gastroesophageal reflux disease)   Hyperlipidemia   Acute cholecystitis   Pulmonary embolism (HCC)   Sepsis secondary to cholecystitis/E. coli bacteremia -Sepsis present on admission, as febrile 102.6, tachycardic 129, tachypneic 24, hypotensive 88/70 -Received stress dose IV steroids, responded to steroids and fluids, did  not require any pressors. -Work-up significant for cholecystitis, confirmed by MRI abdomen. -Mangement per general surgery, for now continue with antibiotics, await further recommendations from surgery.  . - there is no role for percutaneous cholecystostomy drain as no evidence of gallbladder distention. -Patient on IV Zosyn, will narrow to IV Rocephin and Flagyl.  Small PE -Continue with Lovenox - no DVT on venous dopplers  Hypertension - Hold norvasc in setting of Hypotension 2/2 sepsis  -Once blood pressure started to increase I would consider beta-blockers or ACE/ARB given EF 40 to 45% on echo done this admission.  Patient denies abdominal pain today,  HLD - Continue Statin  Right kidney lesion - followed by Dr Abner Greenspan, high suspension for malignancy   Dementia - Continue aricept and namenda  1.1 cm noncalcified lung nodule within the posterior aspect of the left upper lobe. - will need repeat CT in 3 months.  Hypokalemia -We will replete.  DVT prophylaxis: heparin  GTT Code Status: Full Family Communication: D/W daughter by phone today.  Disposition:   Status is: Inpatient  Remains inpatient appropriate because: sepsis       Consultants:  General surgery  Subjective:  Patient denies abdominal pain today, report he is feeling better today, no nausea, no vomiting  Objective: Vitals:   05/14/21 2318 05/15/21 0629 05/15/21 0754 05/15/21 1155  BP: (!) 103/54 124/71 120/71 110/61  Pulse: 78 83 85 92  Resp: 20 20 20 20   Temp: 97.8 F (36.6 C) 98.4 F (36.9 C) 98.2 F (36.8 C) 99 F (37.2 C)  TempSrc: Axillary Oral Oral Oral  SpO2: 97% 99% 98% 100%  Weight:      Height:        Intake/Output Summary (Last 24 hours) at 05/15/2021 1239 Last data filed at 05/15/2021 0910 Gross per 24 hour  Intake 170 ml  Output 100 ml  Net 70 ml   Filed Weights   05/14/21 0906  Weight: 73.2 kg    Examination:  Awake Alert, Oriented X 3, No new F.N deficits, Normal affect Symmetrical Chest wall movement, Good air movement bilaterally, CTAB RRR,No Gallops,Rubs or new Murmurs, No Parasternal Heave +ve B.Sounds, Abd Soft, No tenderness, No rebound - guarding or rigidity. No Cyanosis, Clubbing or edema, No new Rash or bruise        Data Reviewed: I have personally reviewed following labs and imaging studies  CBC: Recent Labs  Lab 05/12/21 1948 05/13/21 0508 05/14/21 0632 05/15/21 0107  WBC 8.9 10.5 8.6 8.0  NEUTROABS 7.6 9.6*  --   --   HGB 16.7 14.1 13.5  12.8*  HCT 51.5 42.5 40.1 38.8*  MCV 92.0 89.9 88.5 89.2  PLT 225 171 150 146*    Basic Metabolic Panel: Recent Labs  Lab 05/12/21 1948 05/13/21 0508 05/13/21 0632 05/14/21 0632 05/15/21 0107  NA 136 131*  --  131* 131*  K 4.3 5.2* 3.6 3.7 3.1*  CL 96* 97*  --  100 98  CO2 30 27  --  23 25  GLUCOSE 111* 96  --  96 86  BUN 10 10  --  7* 7*  CREATININE 0.94 0.78  --  0.88 0.83  CALCIUM 10.1 8.7*  --  8.6* 8.1*  MG  --  1.7  --   --   --     GFR: Estimated Creatinine  Clearance: 74.5 mL/min (by C-G formula based on SCr of 0.83 mg/dL).  Liver Function Tests: Recent Labs  Lab 05/12/21 1948 05/13/21 0508 05/14/21 0632  AST 26 44* 41  ALT 20 22 21   ALKPHOS 116 94 74  BILITOT 3.5* 3.5* 2.2*  PROT 8.1 6.1* 5.6*  ALBUMIN 4.1 3.0* 2.6*    CBG: No results for input(s): GLUCAP in the last 168 hours.   Recent Results (from the past 240 hour(s))  Resp Panel by RT-PCR (Flu A&B, Covid) Nasopharyngeal Swab     Status: None   Collection Time: 05/12/21  9:14 PM   Specimen: Nasopharyngeal Swab; Nasopharyngeal(NP) swabs in vial transport medium  Result Value Ref Range Status   SARS Coronavirus 2 by RT PCR NEGATIVE NEGATIVE Final    Comment: (NOTE) SARS-CoV-2 target nucleic acids are NOT DETECTED.  The SARS-CoV-2 RNA is generally detectable in upper respiratory specimens during the acute phase of infection. The lowest concentration of SARS-CoV-2 viral copies this assay can detect is 138 copies/mL. A negative result does not preclude SARS-Cov-2 infection and should not be used as the sole basis for treatment or other patient management decisions. A negative result may occur with  improper specimen collection/handling, submission of specimen other than nasopharyngeal swab, presence of viral mutation(s) within the areas targeted by this assay, and inadequate number of viral copies(<138 copies/mL). A negative result must be combined with clinical observations, patient history, and epidemiological information. The expected result is Negative.  Fact Sheet for Patients:  EntrepreneurPulse.com.au  Fact Sheet for Healthcare Providers:  IncredibleEmployment.be  This test is no t yet approved or cleared by the [place] FDA and  has been authorized for detection and/or diagnosis of SARS-CoV-2 by FDA under an Emergency Use Authorization (EUA). This EUA will remain  in effect (meaning this test can be used) for the duration  of the COVID-19 declaration under Section 564(b)(1) of the Act, 21 U.S.C.section 360bbb-3(b)(1), unless the authorization is terminated  or revoked sooner.       Influenza A by PCR NEGATIVE NEGATIVE Final   Influenza B by PCR NEGATIVE NEGATIVE Final    Comment: (NOTE) The Xpert Xpress SARS-CoV-2/FLU/RSV plus assay is intended as an aid in the diagnosis of influenza from Nasopharyngeal swab specimens and should not be used as a sole basis for treatment. Nasal washings and aspirates are unacceptable for Xpert Xpress SARS-CoV-2/FLU/RSV testing.  Fact Sheet for Patients: EntrepreneurPulse.com.au  Fact Sheet for Healthcare Providers: IncredibleEmployment.be  This test is not yet approved or cleared by the [place] FDA and has been authorized for detection and/or diagnosis of SARS-CoV-2 by FDA under an Emergency Use Authorization (EUA). This EUA will remain in effect (meaning this test can be used) for the duration  of the COVID-19 declaration under Section 564(b)(1) of the Act, 21 U.S.C. section 360bbb-3(b)(1), unless the authorization is terminated or revoked.  Performed at [facility], [place] [address], [place], [place] [address]   Blood Culture (routine x 2)     Status: Abnormal (Preliminary result)   Collection Time: 05/12/21  9:19 PM   Specimen: BLOOD  Result Value Ref Range Status   Specimen Description BLOOD SITE NOT SPECIFIED  Final   Special Requests   Final    BOTTLES DRAWN AEROBIC AND ANAEROBIC Blood Culture adequate volume   Culture  Setup Time (A)  Final    GRAM VARIABLE ROD IN BOTH AEROBIC AND ANAEROBIC BOTTLES CRITICAL RESULT CALLED TO, READ BACK BY AND VERIFIED WITH: PHARMD E MARTIN [id number] AT 1447 BY CM Performed at [facility], [place] [address] [place], [place] [address]    Culture ESCHERICHIA COLI (A)  Final   Report Status PENDING  Incomplete   Organism ID, Bacteria ESCHERICHIA COLI  Final       Susceptibility   Escherichia coli - MIC*    AMPICILLIN >=32 RESISTANT Resistant     CEFAZOLIN 8 SENSITIVE Sensitive     CEFEPIME <=0.12 SENSITIVE Sensitive     CEFTAZIDIME <=1 SENSITIVE Sensitive     CEFTRIAXONE 0.5 SENSITIVE Sensitive     CIPROFLOXACIN <=0.25 SENSITIVE Sensitive     GENTAMICIN <=1 SENSITIVE Sensitive     IMIPENEM <=0.25 SENSITIVE Sensitive     TRIMETH/SULFA <=20 SENSITIVE Sensitive     AMPICILLIN/SULBACTAM >=32 RESISTANT Resistant     PIP/TAZO 8 SENSITIVE Sensitive     * ESCHERICHIA COLI  Blood Culture ID Panel (Reflexed)     Status: Abnormal   Collection Time: 05/12/21  9:19 PM  Result Value Ref Range Status   Enterococcus faecalis NOT DETECTED NOT DETECTED Final   Enterococcus Faecium NOT DETECTED NOT DETECTED Final   Listeria monocytogenes NOT DETECTED NOT DETECTED Final   Staphylococcus species NOT DETECTED NOT DETECTED Final   Staphylococcus aureus (BCID) NOT DETECTED NOT DETECTED Final   Staphylococcus epidermidis NOT DETECTED NOT DETECTED Final   Staphylococcus lugdunensis NOT DETECTED NOT DETECTED Final   Streptococcus species NOT DETECTED NOT DETECTED Final   Streptococcus agalactiae NOT DETECTED NOT DETECTED Final   Streptococcus pneumoniae NOT DETECTED NOT DETECTED Final   Streptococcus pyogenes NOT DETECTED NOT DETECTED Final   A.calcoaceticus-baumannii NOT DETECTED NOT DETECTED Final   Bacteroides fragilis NOT DETECTED NOT DETECTED Final   Enterobacterales DETECTED (A) NOT DETECTED Final    Comment: Enterobacterales represent a large order of gram negative bacteria, not a single organism. CRITICAL RESULT CALLED TO, READ BACK BY AND VERIFIED WITH: PHARMD E MARTIN [id number] AR 1457 BY CM    Enterobacter cloacae complex NOT DETECTED NOT DETECTED Final   Escherichia coli DETECTED (A) NOT DETECTED Final    Comment: CRITICAL RESULT CALLED TO, READ BACK BY AND VERIFIED WITH: PHARMD E MARTIN [id number] AT 1457 BY CM    Klebsiella aerogenes NOT DETECTED NOT  DETECTED Final   Klebsiella oxytoca NOT DETECTED NOT DETECTED Final   Klebsiella pneumoniae NOT DETECTED NOT DETECTED Final   Proteus species NOT DETECTED NOT DETECTED Final   Salmonella species NOT DETECTED NOT DETECTED Final   Serratia marcescens NOT DETECTED NOT DETECTED Final   Haemophilus influenzae NOT DETECTED NOT DETECTED Final   Neisseria meningitidis NOT DETECTED NOT DETECTED Final   Pseudomonas aeruginosa NOT DETECTED NOT DETECTED Final   Stenotrophomonas maltophilia NOT DETECTED NOT DETECTED  Final   Candida albicans NOT DETECTED NOT DETECTED Final   Candida auris NOT DETECTED NOT DETECTED Final   Candida glabrata NOT DETECTED NOT DETECTED Final   Candida krusei NOT DETECTED NOT DETECTED Final   Candida parapsilosis NOT DETECTED NOT DETECTED Final   Candida tropicalis NOT DETECTED NOT DETECTED Final   Cryptococcus neoformans/gattii NOT DETECTED NOT DETECTED Final   CTX-M ESBL NOT DETECTED NOT DETECTED Final   Carbapenem resistance IMP NOT DETECTED NOT DETECTED Final   Carbapenem resistance KPC NOT DETECTED NOT DETECTED Final   Carbapenem resistance NDM NOT DETECTED NOT DETECTED Final   Carbapenem resist OXA 48 LIKE NOT DETECTED NOT DETECTED Final   Carbapenem resistance VIM NOT DETECTED NOT DETECTED Final    Comment: Performed at [facility], [address], [place], [place] [address]  Blood Culture (routine x 2)     Status: Abnormal   Collection Time: 05/12/21  9:37 PM   Specimen: BLOOD  Result Value Ref Range Status   Specimen Description BLOOD SITE NOT SPECIFIED  Final   Special Requests   Final    BOTTLES DRAWN AEROBIC AND ANAEROBIC Blood Culture adequate volume   Culture  Setup Time (A)  Final    GRAM VARIABLE ROD AEROBIC BOTTLE ONLY CRITICAL VALUE NOTED.  VALUE IS CONSISTENT WITH PREVIOUSLY REPORTED AND CALLED VALUE.    Culture (A)  Final    ESCHERICHIA COLI SUSCEPTIBILITIES PERFORMED ON PREVIOUS CULTURE WITHIN THE LAST 5 DAYS. Performed at [facility], [place] [address], [place], [place] [address]    Report Status 05/15/2021 FINAL  Final  Urine Culture     Status: None   Collection Time: 05/13/21  3:06 AM   Specimen: Urine, Clean Catch  Result Value Ref Range Status   Specimen Description URINE, CLEAN CATCH  Final   Special Requests NONE  Final   Culture   Final    NO GROWTH Performed at [facility], [place] [address] [place], [place] [address]    Report Status 05/14/2021 FINAL  Final         Radiology Studies: MR 3D Recon At Scanner  Result Date: 05/13/2021 CLINICAL DATA:  Abdominal pain. Biliary obstruction suspected. Rigors. Nausea and vomiting. Diarrhea for 2 weeks. Weight loss. EXAM: MRI ABDOMEN WITHOUT AND WITH CONTRAST (INCLUDING MRCP) TECHNIQUE: Multiplanar multisequence MR imaging of the abdomen was performed both before and after the administration of intravenous contrast. Heavily T2-weighted images of the biliary and pancreatic ducts were obtained, and three-dimensional MRCP images were rendered by post processing. CONTRAST:  7.40mL GADAVIST GADOBUTROL 1 MMOL/ML IV SOLN COMPARISON:  Ultrasound and CT of 1 day prior. FINDINGS: Portions of exam are mild to moderately motion degraded. Lower chest: New right base airspace disease since yesterday's CT. Mild cardiomegaly, without pericardial or pleural effusion. Hepatobiliary: Dependent small gallstones. The gallbladder is contracted with apparent mild gallbladder wall thickening. Within the pericholecystic right liver lobe, a 3.5 x 3.2 cm centrally hypoenhancing and mildly peripherally hyperenhancing lesion is seen, including on 71/1104. Mildly T2 hypointense with mildly restricted diffusion. New since 04/03/2021. Mild intrahepatic biliary duct dilatation. The common duct is chronically dilated at 11 mm on 22/2. Followed to just above the level of the ampulla. On some images, including the dedicated MRCP sequence, a distal common duct filling defect is possible. Example at 6  mm 85/6. Pancreas: Pancreatic atrophy, without duct dilatation. Nonspecific edema within the left anterior pararenal space including on 18/4. Spleen:  Normal in  size, without focal abnormality. Adrenals/Urinary Tract: Normal adrenal glands. Bilateral renal cysts. Arterially hyperenhancing interpolar right renal 2.8 x 2.6 cm lesion on 90/1101. No hydronephrosis. Stomach/Bowel: Grossly normal stomach and abdominal bowel loops. Vascular/Lymphatic:  Aortic atherosclerosis.  Patent renal veins. Again identified is necrotic adenopathy within the upper abdomen. Example gastrohepatic ligament node of 1.7 cm on 65/1102. A portacaval 11 mm necrotic node on 74/1103 Other:  No ascites. Musculoskeletal: No acute osseous abnormality. IMPRESSION: 1. Motion degraded images. 2. Cholelithiasis. Suggestion of wall thickening in the setting of underdistention. Development of a pericholecystic liver lesion which is suspicious for extension of chronic cholecystitis. Direct extension of gallbladder carcinoma felt less likely, given absence of dominant gallbladder mass. 3. Upper abdominal necrotic adenopathy, present back to 04/03/2021. Outpatient PET may be informative to direct sampling. 4. Mild intra and extrahepatic biliary duct dilatation. Equivocal findings for distal common duct stone, given above limitations. If bilirubin is elevated, consider ERCP. 5. Interpolar right renal enhancing mass, consistent with renal cell carcinoma. 6. Mild edema within the anterior pararenal space. Correlate with pancreatic enzyme levels to exclude mild pancreatitis. 7. Right base airspace disease, new since yesterday's CT. Favor atelectasis. Electronically Signed   By: Abigail Miyamoto M.D.   On: 05/13/2021 13:31   MR ABDOMEN MRCP W WO CONTAST  Result Date: 05/13/2021 CLINICAL DATA:  Abdominal pain. Biliary obstruction suspected. Rigors. Nausea and vomiting. Diarrhea for 2 weeks. Weight loss. EXAM: MRI ABDOMEN WITHOUT AND WITH CONTRAST (INCLUDING  MRCP) TECHNIQUE: Multiplanar multisequence MR imaging of the abdomen was performed both before and after the administration of intravenous contrast. Heavily T2-weighted images of the biliary and pancreatic ducts were obtained, and three-dimensional MRCP images were rendered by post processing. CONTRAST:  7.4mL GADAVIST GADOBUTROL 1 MMOL/ML IV SOLN COMPARISON:  Ultrasound and CT of 1 day prior. FINDINGS: Portions of exam are mild to moderately motion degraded. Lower chest: New right base airspace disease since yesterday's CT. Mild cardiomegaly, without pericardial or pleural effusion. Hepatobiliary: Dependent small gallstones. The gallbladder is contracted with apparent mild gallbladder wall thickening. Within the pericholecystic right liver lobe, a 3.5 x 3.2 cm centrally hypoenhancing and mildly peripherally hyperenhancing lesion is seen, including on 71/1104. Mildly T2 hypointense with mildly restricted diffusion. New since 04/03/2021. Mild intrahepatic biliary duct dilatation. The common duct is chronically dilated at 11 mm on 22/2. Followed to just above the level of the ampulla. On some images, including the dedicated MRCP sequence, a distal common duct filling defect is possible. Example at 6 mm 85/6. Pancreas: Pancreatic atrophy, without duct dilatation. Nonspecific edema within the left anterior pararenal space including on 18/4. Spleen:  Normal in size, without focal abnormality. Adrenals/Urinary Tract: Normal adrenal glands. Bilateral renal cysts. Arterially hyperenhancing interpolar right renal 2.8 x 2.6 cm lesion on 90/1101. No hydronephrosis. Stomach/Bowel: Grossly normal stomach and abdominal bowel loops. Vascular/Lymphatic:  Aortic atherosclerosis.  Patent renal veins. Again identified is necrotic adenopathy within the upper abdomen. Example gastrohepatic ligament node of 1.7 cm on 65/1102. A portacaval 11 mm necrotic node on 74/1103 Other:  No ascites. Musculoskeletal: No acute osseous abnormality.  IMPRESSION: 1. Motion degraded images. 2. Cholelithiasis. Suggestion of wall thickening in the setting of underdistention. Development of a pericholecystic liver lesion which is suspicious for extension of chronic cholecystitis. Direct extension of gallbladder carcinoma felt less likely, given absence of dominant gallbladder mass. 3. Upper abdominal necrotic adenopathy, present back to 04/03/2021. Outpatient PET may be informative to direct sampling. 4. Mild intra and extrahepatic biliary duct dilatation.  Equivocal findings for distal common duct stone, given above limitations. If bilirubin is elevated, consider ERCP. 5. Interpolar right renal enhancing mass, consistent with renal cell carcinoma. 6. Mild edema within the anterior pararenal space. Correlate with pancreatic enzyme levels to exclude mild pancreatitis. 7. Right base airspace disease, new since yesterday's CT. Favor atelectasis. Electronically Signed   By: Abigail Miyamoto M.D.   On: 05/13/2021 13:31   ECHOCARDIOGRAM COMPLETE  Result Date: 05/13/2021    ECHOCARDIOGRAM REPORT   Patient Name:   [facility] Date of Exam: 05/13/2021 Medical Rec #:  [id number]   Height:       71.0 in Accession #:    [id number]  Weight:       165.0 lb Date of Birth:  July 21, 1940   BSA:          1.943 m Patient Age:    89 years    BP:           101/70 mmHg Patient Gender: M           HR:           74 bpm. Exam Location:  Inpatient Procedure: 2D Echo, Cardiac Doppler, Color Doppler and Intracardiac            Opacification Agent Indications:    Pulmonary emblous  History:        Patient has no prior history of Echocardiogram examinations.                 CAD, COPD; Risk Factors:Hypertension.  Sonographer:    Glo Herring Referring Phys: [id number] [place]  1. Left ventricular ejection fraction, by estimation, is 40 to 45%. The left ventricle has mildly decreased function. The left ventricle demonstrates global hypokinesis. Left ventricular diastolic  parameters are indeterminate.  2. Right ventricular systolic function is normal. The right ventricular size is mildly enlarged. Tricuspid regurgitation signal is inadequate for assessing PA pressure.  3. Right atrial size was mildly dilated.  4. The mitral valve is normal in structure. Trivial mitral valve regurgitation. No evidence of mitral stenosis.  5. The aortic valve is tricuspid. Aortic valve regurgitation is not visualized. No aortic stenosis is present.  6. The inferior vena cava is dilated in size with >50% respiratory variability, suggesting right atrial pressure of 8 mmHg. FINDINGS  Left Ventricle: Left ventricular ejection fraction, by estimation, is 40 to 45%. The left ventricle has mildly decreased function. The left ventricle demonstrates global hypokinesis. The left ventricular internal cavity size was normal in size. There is  no left ventricular hypertrophy. Left ventricular diastolic parameters are indeterminate. Right Ventricle: The right ventricular size is mildly enlarged. No increase in right ventricular wall thickness. Right ventricular systolic function is normal. Tricuspid regurgitation signal is inadequate for assessing PA pressure. Left Atrium: Left atrial size was normal in size. Right Atrium: Right atrial size was mildly dilated. Pericardium: There is no evidence of pericardial effusion. Mitral Valve: The mitral valve is normal in structure. Trivial mitral valve regurgitation. No evidence of mitral valve stenosis. Tricuspid Valve: The tricuspid valve is normal in structure. Tricuspid valve regurgitation is not demonstrated. Aortic Valve: The aortic valve is tricuspid. Aortic valve regurgitation is not visualized. No aortic stenosis is present. Aortic valve mean gradient measures 1.0 mmHg. Aortic valve peak gradient measures 2.7 mmHg. Aortic valve area, by VTI measures 3.05 cm. Pulmonic Valve: The pulmonic valve was not well visualized. Pulmonic valve regurgitation is not visualized.  Aorta: The aortic root is normal  in size and structure. Venous: The inferior vena cava is dilated in size with greater than 50% respiratory variability, suggesting right atrial pressure of 8 mmHg. IAS/Shunts: The interatrial septum was not well visualized.  LEFT VENTRICLE PLAX 2D LVIDd:         4.70 cm      Diastology LVIDs:         3.00 cm      LV e' medial:    7.18 cm/s LV PW:         0.90 cm      LV E/e' medial:  8.4 LV IVS:        0.90 cm      LV e' lateral:   8.92 cm/s LVOT diam:     2.10 cm      LV E/e' lateral: 6.8 LV SV:         52 LV SV Index:   27 LVOT Area:     3.46 cm  LV Volumes (MOD) LV vol d, MOD A2C: 117.0 ml LV vol d, MOD A4C: 129.0 ml LV vol s, MOD A2C: 73.4 ml LV vol s, MOD A4C: 81.0 ml LV SV MOD A2C:     43.6 ml LV SV MOD A4C:     129.0 ml LV SV MOD BP:      44.8 ml RIGHT VENTRICLE             IVC RV Basal diam:  4.20 cm     IVC diam: 2.40 cm RV S prime:     11.90 cm/s LEFT ATRIUM           Index        RIGHT ATRIUM           Index LA diam:      2.60 cm 1.34 cm/m   RA Area:     23.40 cm LA Vol (A2C): 35.8 ml 18.42 ml/m  RA Volume:   74.30 ml  38.24 ml/m LA Vol (A4C): 58.1 ml 29.90 ml/m  AORTIC VALVE AV Area (Vmax):    3.12 cm AV Area (Vmean):   3.01 cm AV Area (VTI):     3.05 cm AV Vmax:           81.90 cm/s AV Vmean:          55.200 cm/s AV VTI:            0.169 m AV Peak Grad:      2.7 mmHg AV Mean Grad:      1.0 mmHg LVOT Vmax:         73.80 cm/s LVOT Vmean:        47.900 cm/s LVOT VTI:          0.149 m LVOT/AV VTI ratio: 0.88  AORTA Ao Root diam: 3.40 cm MITRAL VALVE MV Area (PHT): 3.83 cm    SHUNTS MV Decel Time: 198 msec    Systemic VTI:  0.15 m MV E velocity: 60.40 cm/s  Systemic Diam: 2.10 cm MV A velocity: 75.00 cm/s MV E/A ratio:  0.81 Oswaldo Milian MD Electronically signed by Oswaldo Milian MD Signature Date/Time: 05/13/2021/4:24:59 PM    Final         Scheduled Meds:  aspirin EC  81 mg Oral Daily   atorvastatin  20 mg Oral Daily   donepezil  10 mg Oral  QHS   enoxaparin (LOVENOX) injection  75 mg Subcutaneous Q12H   memantine  10 mg Oral BID   potassium chloride  40 mEq Oral Q6H   Continuous Infusions:  cefTRIAXone (ROCEPHIN)  IV 2 g (05/14/21 1927)   lactated ringers 75 mL/hr at 05/14/21 2318   metronidazole 500 mg (05/15/21 0831)     LOS: 2 days      Phillips Climes, MD [facility]   To contact the attending provider between 7A-7P or the covering provider during after hours 7P-7A, please log into the web site [email or web address] and access using universal [place] password for that web site. If you do not have the password, please call the hospital operator.  05/15/2021, 12:39 PM

## 2021-05-16 LAB — CULTURE, BLOOD (ROUTINE X 2): Special Requests: ADEQUATE

## 2021-05-16 LAB — CBC
HCT: 38.4 % — ABNORMAL LOW (ref 39.0–52.0)
Hemoglobin: 13.1 g/dL (ref 13.0–17.0)
MCH: 30 pg (ref 26.0–34.0)
MCHC: 34.1 g/dL (ref 30.0–36.0)
MCV: 87.9 fL (ref 80.0–100.0)
Platelets: 149 10*3/uL — ABNORMAL LOW (ref 150–400)
RBC: 4.37 MIL/uL (ref 4.22–5.81)
RDW: 15.1 % (ref 11.5–15.5)
WBC: 6.8 10*3/uL (ref 4.0–10.5)
nRBC: 0 % (ref 0.0–0.2)

## 2021-05-16 LAB — HEPATITIS B SURFACE ANTIGEN: Hepatitis B Surface Ag: NONREACTIVE

## 2021-05-16 LAB — BASIC METABOLIC PANEL
Anion gap: 6 (ref 5–15)
BUN: 5 mg/dL — ABNORMAL LOW (ref 8–23)
CO2: 23 mmol/L (ref 22–32)
Calcium: 8 mg/dL — ABNORMAL LOW (ref 8.9–10.3)
Chloride: 101 mmol/L (ref 98–111)
Creatinine, Ser: 0.69 mg/dL (ref 0.61–1.24)
GFR, Estimated: >60 mL/min (ref >60)
Glucose, Bld: 90 mg/dL (ref 70–99)
Potassium: 4 mmol/L (ref 3.5–5.1)
Sodium: 130 mmol/L — ABNORMAL LOW (ref 135–145)

## 2021-05-16 LAB — HEPATITIS B CORE ANTIBODY, TOTAL: Hep B Core Total Ab: REACTIVE — AB

## 2021-05-16 LAB — HEPATITIS C ANTIBODY: HCV Ab: NONREACTIVE

## 2021-05-16 LAB — HIV ANTIBODY (ROUTINE TESTING W REFLEX): HIV Screen 4th Generation wRfx: NONREACTIVE

## 2021-05-16 LAB — HEPATITIS A ANTIBODY, TOTAL: hep A Total Ab: REACTIVE — AB

## 2021-05-16 MED ORDER — METOPROLOL TARTRATE 12.5 MG HALF TABLET
12.5000 mg | ORAL_TABLET | Freq: Two times a day (BID) | ORAL | Status: DC
  Administered 2021-05-16 – 2021-05-21 (×11): 12.5 mg via ORAL
  Filled 2021-05-16 (×11): qty 1

## 2021-05-16 MED ORDER — ACETAMINOPHEN 500 MG PO TABS: 1000.0000 mg | ORAL_TABLET | Freq: Four times a day (QID) | ORAL | Status: DC | PRN

## 2021-05-16 NOTE — Care Management Important Message (Signed)
Important Message  Patient Details  Name: Andre [name] MRN: [id number] Date of Birth: Nov 06, 1940   Medicare Important Message Given:  Yes     Orbie Pyo 05/16/2021, 4:28 PM

## 2021-05-16 NOTE — Progress Notes (Signed)
Patient's daughter Andre [name] called to return a missed phone call from the doctor. She wanted staff to know you can leave a detailed message on her voicemail as she works until 4:30pm.

## 2021-05-16 NOTE — Progress Notes (Signed)
1830: Patient's daughter, [name] would like a call from social work about resources for her father and some concerns she has regarding his transition home. Her phone number is listed under his contacts. Thanks!

## 2021-05-16 NOTE — Consult Note (Signed)
[facility]    Date of Admission:  05/12/2021   Total days of inpatient antibiotics 5        Reason for Consult: 5    Principal Problem:   Sepsis ([employer]) Active Problems:   Essential hypertension, benign   GERD (gastroesophageal reflux disease)   Hyperlipidemia   Acute cholecystitis   Pulmonary embolism [facility])   Assessment: 80 year old male with rectal cancer status post excision about 10 years ago, CAD, TIA admitted for sepsis secondary to E. coli bacteremia. PT had rigors and diarrhea, abdominal pain leading up to admission.   #E coli bacteremia 2/2 cholecystitis/intraabdominal infection #Concern for malignancy, renal mass, necrotic lymphadenopathy, Hx of rectal cancer -CT chest showed small subsegmental PE, 1.1 cm non calcified lung nodule LUL -CT abdomen pelvis showed concern for cholecystitis, necrotic abdominal lymphadenopathy, renal mass suspicious for renal cancer -MRCP showed chronic cholecystitis with cholelithiasis, necrotic adenopathy, renal mass consistent with renal cancer. General surgery consulted and no plans for intervention  as imaging is unclear if cholecystitis present.  Recommend GI engagement for EUS and LN bx.  -During the 04/06/21 visit with Dr. Jeneen Rinks Jones(PCP) pt was noted to have 20 lb wight loss in 2 months. He had followed up with urology who had recommend PET scan. Given his Hs of rectal Cx, weight loss and renal lesion suspect the necrotic lymph nodes are due malignancy. Of note he has a history of epigastric pain for the past few months.  -Would still cover for intraabdominal infection as liver lesion(3x2.6cm) on MRCP is new since 04/03/21 imaging.  Recommendations:  -Continue ceftriaxone and metronidazole for intraabdominal infection   -Agree with gastroenterology consult for EUS and LN Bx with path and Cx(Bacterial/afb/fungal). Given pt's Hx of rectal cancer would suspect malignancy.  -HIV, RPR, IGRA, Hep Panel for  infectious causes of lymphadenitis  Microbiology:   Antibiotics: Pip-tazo 12/1-12/3 Ceftriaxone 12/3-p Metronidazole 12/3-p  Cultures: Blood 12/1 2/2 Ecoli Urine 12/1 no growth    HPI: Andre [name] is a 80 y.o. male, CAD, hypertension, rectal cancer status post excision, TIA presented to the ED with rigors that started around 3 PM.  Patient also said abdominal pain and diarrhea for the 2 weeks prior to admission.  Diagnosed with cholelithiasis 2 weeks ago possibility of gallbladder removal.  On arrival to the ED temp of 100.1 CT show 100.6.  CT showed subsegmental PE, suspicion for acute cholecystitis necrotic lymph nodes.  General surgery was consulted and recommended MRCP.  MRCP showed chronic cholecystitis.  No plans for surgery, recommended GI consult for EUS for lymph nodes.  ID consulted as patient found to have E. coli bacteremia Today, patient resting in bed.  He reports he has had abdominal pain for the past few months.  He is a poor historian.  At first he denied having rectal cancer, then he states it was 15 year ago for which he recieved excisional therapy.    Review of Systems: ROS  Past Medical History:  Diagnosis Date   Arthritis    Coronary artery disease, non-occlusive    a. 06/2011: cath for abnormal exercise echo: Only 30-40% proximal RCA. Otherwise normal. b. 10/2015: cath for abnormal NST and atypical CP --> showed mild 30-40% stenosis in the prox-distal RCA   GERD (gastroesophageal reflux disease)    HA (headache)    Hypertension    Memory loss    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[place])    TIA (transient ischemic attack)  Weakness     Social History   Tobacco Use   Smoking status: Some Days    Packs/day: 0.25    Types: Cigarettes   Smokeless tobacco: Never  Vaping Use   Vaping Use: Never used  Substance Use Topics   Alcohol use: Yes    Comment: drinks "every now and then"   Drug use: No    Family History  Problem Relation Age of Onset   Hypertension Mother     Cancer Mother    Liver disease Father    Scheduled Meds:  aspirin EC  81 mg Oral Daily   atorvastatin  20 mg Oral Daily   donepezil  10 mg Oral QHS   enoxaparin (LOVENOX) injection  75 mg Subcutaneous Q12H   memantine  10 mg Oral BID   senna-docusate  2 tablet Oral BID   Continuous Infusions:  cefTRIAXone (ROCEPHIN)  IV 2 g (05/15/21 1807)   lactated ringers 75 mL/hr at 05/16/21 0823   metronidazole 500 mg (05/16/21 0825)   PRN Meds:.acetaminophen **OR** [DISCONTINUED] acetaminophen, ondansetron **OR** ondansetron (ZOFRAN) IV, oxyCODONE, tiZANidine No Known Allergies  OBJECTIVE: Blood pressure (!) 145/74, pulse 76, temperature 98 F (36.7 C), temperature source Oral, resp. rate 20, height 5\' 10"  (1.778 m), weight 73.2 kg, SpO2 91 %.  Physical Exam Constitutional:      General: He is not in acute distress.    Appearance: He is normal weight. He is not toxic-appearing.  HENT:     Head: Normocephalic and atraumatic.     Right Ear: External ear normal.     Left Ear: External ear normal.     Nose: No congestion or rhinorrhea.     Mouth/Throat:     Mouth: Mucous membranes are moist.     Pharynx: Oropharynx is clear.  Eyes:     Extraocular Movements: Extraocular movements intact.     Conjunctiva/sclera: Conjunctivae normal.     Pupils: Pupils are equal, round, and reactive to light.  Cardiovascular:     Rate and Rhythm: Normal rate and regular rhythm.     Heart sounds: No murmur heard.   No friction rub. No gallop.  Pulmonary:     Effort: Pulmonary effort is normal.     Breath sounds: Normal breath sounds.  Abdominal:     General: Abdomen is flat. Bowel sounds are normal.     Palpations: Abdomen is soft.  Musculoskeletal:        General: No swelling. Normal range of motion.     Cervical back: Normal range of motion and neck supple.  Skin:    General: Skin is warm and dry.  Neurological:     General: No focal deficit present.     Mental Status: He is oriented to  person, place, and time.  Psychiatric:        Mood and Affect: Mood normal.    Lab Results Lab Results  Component Value Date   WBC 6.8 05/16/2021   HGB 13.1 05/16/2021   HCT 38.4 (L) 05/16/2021   MCV 87.9 05/16/2021   PLT 149 (L) 05/16/2021    Lab Results  Component Value Date   CREATININE 0.69 05/16/2021   BUN 5 (L) 05/16/2021   NA 130 (L) 05/16/2021   K 4.0 05/16/2021   CL 101 05/16/2021   CO2 23 05/16/2021    Lab Results  Component Value Date   ALT 21 05/14/2021   AST 41 05/14/2021   ALKPHOS 74 05/14/2021   BILITOT 2.2 (H)  05/14/2021       Laurice Record, [facility] 05/16/2021, 1:52 PM

## 2021-05-16 NOTE — Evaluation (Signed)
Physical Therapy Evaluation Patient Details Name: Andre [name] MRN: [id number] DOB: [birth year] Today's Date: 05/16/2021  History of Present Illness  pt is a 80 y/o male admitted with c/o rigors and abdominal pain.  Work up for sepsis due to cholecystities/E. coli bacteremia. Small PE found.  PMHx:  GERD, CAD, HTN, rectal CA, TIA  Clinical Impression  Pt admitted with/for problem as stated above.  Pt presently weak with mild balance issues, needing light minimal assist for stability during gait and dynamic standing tasks..  Pt currently limited functionally due to the problems listed below.  (see problems list.)  Pt will benefit from PT to maximize function and safety to be able to get home safely with available assist.        Recommendations for follow up therapy are one component of a multi-disciplinary discharge planning process, led by the attending physician.  Recommendations may be updated based on patient status, additional functional criteria and insurance authorization.  Follow Up Recommendations No PT follow up    Assistance Recommended at Discharge Intermittent Supervision/Assistance  Functional Status Assessment Patient has had a recent decline in their functional status and demonstrates the ability to make significant improvements in function in a reasonable and predictable amount of time.  Equipment Recommendations  None recommended by PT    Recommendations for Other Services       Precautions / Restrictions Precautions Precautions: Fall      Mobility  Bed Mobility Overal bed mobility: Modified Independent                  Transfers Overall transfer level: Needs assistance   Transfers: Sit to/from Stand Sit to Stand: Min guard           General transfer comment: pt used UE's and rock to attain standing on the 2nd try.  x2.  No assist given,  bed not elevated.    Ambulation/Gait Ambulation/Gait assistance: Min Web designer (Feet): 280  Feet Assistive device: None;IV Pole Gait Pattern/deviations: Step-through pattern;Decreased step length - right;Decreased step length - left;Decreased stride length   Gait velocity interpretation: <1.8 ft/sec, indicate of risk for recurrent falls   General Gait Details: mildly unsteady overall with drift or soft stagger to the right to fignt for balance.  short, slow and guarded steps overall.  Stairs            Wheelchair Mobility    Modified Rankin (Stroke Patients Only)       Balance Overall balance assessment: Needs assistance Sitting-balance support: No upper extremity supported;Feet supported Sitting balance-Leahy Scale: Fair     Standing balance support: No upper extremity supported;Single extremity supported Standing balance-Leahy Scale: Fair Standing balance comment: able to stand without assist, more reliant on stability assist dynamically                             Pertinent Vitals/Pain Pain Assessment: Faces Faces Pain Scale: Hurts a little bit Pain Location: abdominal pain Pain Intervention(s): Monitored during session    [place] expects to be discharged to:: Private residence Living Arrangements: Non-relatives/Friends Available Help at Discharge: Friend(s);Available 24 hours/day Type of Home: House Home Access: Stairs to enter Entrance Stairs-Rails: Psychiatric nurse of Steps: 4   Home Layout: One level Home Equipment: None      Prior Function Prior Level of Function : Independent/Modified Independent  Hand Dominance        Extremity/Trunk Assessment   Upper Extremity Assessment Upper Extremity Assessment: Overall WFL for tasks assessed    Lower Extremity Assessment Lower Extremity Assessment: Overall WFL for tasks assessed;Generalized weakness    Cervical / Trunk Assessment Cervical / Trunk Assessment: Kyphotic  Communication   Communication: No  difficulties  Cognition Arousal/Alertness: Awake/alert Behavior During Therapy: WFL for tasks assessed/performed Overall Cognitive Status: Within Functional Limits for tasks assessed                                          General Comments      Exercises     Assessment/Plan    PT Assessment Patient needs continued PT services  PT Problem List Decreased strength;Decreased activity tolerance;Decreased balance;Decreased mobility;Cardiopulmonary status limiting activity;Pain       PT Treatment Interventions Gait training;Stair training;Functional mobility training;Therapeutic activities;Patient/family education    PT Goals (Current goals can be found in the Care Plan section)  Acute Rehab PT Goals Patient Stated Goal: home after the problem is okay PT Goal Formulation: With patient Time For Goal Achievement: 05/30/21 Potential to Achieve Goals: Good    Frequency Min 3X/week   Barriers to discharge        Co-evaluation               AM-PAC PT "6 Clicks" Mobility  Outcome Measure Help needed turning from your back to your side while in a flat bed without using bedrails?: None Help needed moving from lying on your back to sitting on the side of a flat bed without using bedrails?: None Help needed moving to and from a bed to a chair (including a wheelchair)?: A Little Help needed standing up from a chair using your arms (e.g., wheelchair or bedside chair)?: A Little Help needed to walk in hospital room?: A Little Help needed climbing 3-5 steps with a railing? : A Little 6 Click Score: 20    End of Session   Activity Tolerance: Patient tolerated treatment well;Patient limited by fatigue Patient left: in bed;with call bell/phone within reach;with bed alarm set Nurse Communication: Mobility status PT Visit Diagnosis: Unsteadiness on feet (R26.81);Muscle weakness (generalized) (M62.81);Difficulty in walking, not elsewhere classified (R26.2)    Time:  0354-6568 PT Time Calculation (min) (ACUTE ONLY): 22 min   Charges:   PT Evaluation $PT Eval Low Complexity: 1 Low PT Treatments $Gait Training: 8-22 mins        05/16/2021  Andre Carne., PT Acute Rehabilitation Services [phone]  (pager) [phone]  (office)  Andre [name] 05/16/2021, 2:33 PM

## 2021-05-16 NOTE — Progress Notes (Signed)
PROGRESS NOTE    Andre [name]  AJO:[id number] DOB: 1940/09/11 DOA: 05/12/2021 PCP: Biagio Borg, MD    Chief Complaint  Patient presents with   Tremors   Code Sepsis    Brief Narrative:   Andre [name]  is a 80 y.o. male, with history of GERD, coronary artery disease, hypertension, rectal cancer, TIA, weakness, presents ED with a chief complaint of rigors, fever, patient endorses abdominal pain for few weeks now, his work-up in ED significant for acute cholecystitis.  Assessment & Plan:   Principal Problem:   Sepsis ([place]) Active Problems:   Essential hypertension, benign   GERD (gastroesophageal reflux disease)   Hyperlipidemia   Acute cholecystitis   Pulmonary embolism (HCC)   Sepsis secondary to cholecystitis/E. coli bacteremia -Sepsis present on admission, as febrile 102.6, tachycardic 129, tachypneic 24, hypotensive 88/70 -Work-up significant for gallbladder wall thickening, with questionable cholecystitis, as well with evidence of E. coli bacteremia, upper abdominal adenopathy, MRI concerning for cholecystitis.  -Mangement per general surgery, for now continue with antibiotics. -As well some of the clinical picture suspicious for malignancy given surrounding lymphadenopathy, and gallbladder is not distended on imaging, so GI consulted, plan for EGD EUS with biopsy of surrounding lymph nodes, attempt lymph node biopsy prior to liver biopsy which can be done by IR . - there is no role for percutaneous cholecystostomy drain as no evidence of gallbladder distention. -Patient on IV Zosyn initially, did narrow to IV Rocephin and Flagyl.  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  -Blood pressure started to increase will start on  low-dose metoprolol.  HLD - Continue Statin  Right kidney lesion - followed by Dr Abner Greenspan, high suspension for malignancy   Dementia - Continue aricept and namenda  1.1 cm noncalcified lung nodule within the posterior aspect of the left upper lobe. - will need repeat CT in 3 months.  Hypokalemia -We will replete.  DVT prophylaxis: heparin GTT Code Status: Full Family Communication: None at bedside , left daughter a voicmail Disposition:   Status is: Inpatient  Remains inpatient appropriate because: sepsis       Consultants:  General surgery ID GI  Subjective:  Patient denies abdominal pain today, report he is feeling better today, no nausea, no vomiting  Objective: Vitals:   05/15/21 1624 05/15/21 2227 05/15/21 2355 05/16/21 1236  BP: (!) 142/77 (!) 104/57 (!) 92/56 (!) 145/74  Pulse: (!) 102 97 100 76  Resp: 20 20 20 20   Temp: 98 F (36.7 C) 98.3 F (36.8 C) 98 F (36.7 C) 98 F (36.7 C)  TempSrc: Oral Oral Oral Oral  SpO2: 93% (!) 87% 95% 91%  Weight:      Height:        Intake/Output Summary (Last 24 hours) at 05/16/2021 1513 Last data filed at 05/16/2021 1459 Gross per 24 hour  Intake 0 ml  Output 250 ml  Net -250 ml   Filed Weights   05/14/21 0906  Weight: 73.2 kg    Examination:  Awake Alert, Oriented X 3, No new F.N deficits, Normal affect Symmetrical Chest wall movement, Good air movement bilaterally, CTAB RRR,No Gallops,Rubs or new Murmurs, No Parasternal Heave +ve B.Sounds, Abd Soft, No tenderness, No rebound - guarding or rigidity. No  Cyanosis, Clubbing or edema, No new Rash or bruise        Data Reviewed: I have personally reviewed following labs and imaging studies  CBC: Recent Labs  Lab 05/12/21 1948 05/13/21 0508 05/14/21 0632 05/15/21 0107 05/16/21 0050  WBC 8.9 10.5 8.6 8.0 6.8  NEUTROABS 7.6 9.6*  --   --   --   HGB 16.7 14.1 13.5 12.8* 13.1  HCT 51.5 42.5 40.1 38.8* 38.4*  MCV 92.0 89.9 88.5 89.2 87.9  PLT 225  171 150 146* 149*    Basic Metabolic Panel: Recent Labs  Lab 05/12/21 1948 05/13/21 0508 05/13/21 0632 05/14/21 0632 05/15/21 0107 05/16/21 0050  NA 136 131*  --  131* 131* 130*  K 4.3 5.2* 3.6 3.7 3.1* 4.0  CL 96* 97*  --  100 98 101  CO2 30 27  --  23 25 23   GLUCOSE 111* 96  --  96 86 90  BUN 10 10  --  7* 7* 5*  CREATININE 0.94 0.78  --  0.88 0.83 0.69  CALCIUM 10.1 8.7*  --  8.6* 8.1* 8.0*  MG  --  1.7  --   --   --   --     GFR: Estimated Creatinine Clearance: 77.3 mL/min (by C-G formula based on SCr of 0.69 mg/dL).  Liver Function Tests: Recent Labs  Lab 05/12/21 1948 05/13/21 0508 05/14/21 0632  AST 26 44* 41  ALT 20 22 21   ALKPHOS 116 94 74  BILITOT 3.5* 3.5* 2.2*  PROT 8.1 6.1* 5.6*  ALBUMIN 4.1 3.0* 2.6*    CBG: No results for input(s): GLUCAP in the last 168 hours.   Recent Results (from the past 240 hour(s))  Resp Panel by RT-PCR (Flu A&B, Covid) Nasopharyngeal Swab     Status: None   Collection Time: 05/12/21  9:14 PM   Specimen: Nasopharyngeal Swab; Nasopharyngeal(NP) swabs in vial transport medium  Result Value Ref Range Status   SARS Coronavirus 2 by RT PCR NEGATIVE NEGATIVE Final    Comment: (NOTE) SARS-CoV-2 target nucleic acids are NOT DETECTED.  The SARS-CoV-2 RNA is generally detectable in upper respiratory specimens during the acute phase of infection. The lowest concentration of SARS-CoV-2 viral copies this assay can detect is 138 copies/mL. A negative result does not preclude SARS-Cov-2 infection and should not be used as the sole basis for treatment or other patient management decisions. A negative result may occur with  improper specimen collection/handling, submission of specimen other than nasopharyngeal swab, presence of viral mutation(s) within the areas targeted by this assay, and inadequate number of viral copies(<138 copies/mL). A negative result must be combined with clinical observations, patient history, and  epidemiological information. The expected result is Negative.  Fact Sheet for Patients:  EntrepreneurPulse.com.au  Fact Sheet for Healthcare Providers:  IncredibleEmployment.be  This test is no t yet approved or cleared by the [place] FDA and  has been authorized for detection and/or diagnosis of SARS-CoV-2 by FDA under an Emergency Use Authorization (EUA). This EUA will remain  in effect (meaning this test can be used) for the duration of the COVID-19 declaration under Section 564(b)(1) of the Act, 21 U.S.C.section 360bbb-3(b)(1), unless the authorization is terminated  or revoked sooner.       Influenza A by PCR NEGATIVE NEGATIVE Final   Influenza B by PCR NEGATIVE NEGATIVE Final    Comment: (NOTE) The Xpert Xpress SARS-CoV-2/FLU/RSV plus assay is intended as an aid in the diagnosis of influenza from  Nasopharyngeal swab specimens and should not be used as a sole basis for treatment. Nasal washings and aspirates are unacceptable for Xpert Xpress SARS-CoV-2/FLU/RSV testing.  Fact Sheet for Patients: EntrepreneurPulse.com.au  Fact Sheet for Healthcare Providers: IncredibleEmployment.be  This test is not yet approved or cleared by the [place] FDA and has been authorized for detection and/or diagnosis of SARS-CoV-2 by FDA under an Emergency Use Authorization (EUA). This EUA will remain in effect (meaning this test can be used) for the duration of the COVID-19 declaration under Section 564(b)(1) of the Act, 21 U.S.C. section 360bbb-3(b)(1), unless the authorization is terminated or revoked.  Performed at [facility], [place] [address], [place], [place] [address]   Blood Culture (routine x 2)     Status: Abnormal   Collection Time: 05/12/21  9:19 PM   Specimen: BLOOD  Result Value Ref Range Status   Specimen Description BLOOD SITE NOT SPECIFIED  Final   Special Requests   Final     BOTTLES DRAWN AEROBIC AND ANAEROBIC Blood Culture adequate volume   Culture  Setup Time (A)  Final    GRAM VARIABLE ROD IN BOTH AEROBIC AND ANAEROBIC BOTTLES CRITICAL RESULT CALLED TO, READ BACK BY AND VERIFIED WITH: PHARMD E MARTIN [id number] AT 1447 BY CM Performed at [facility], [place] [address] [place], [place] [address]    Culture ESCHERICHIA COLI (A)  Final   Report Status 05/16/2021 FINAL  Final   Organism ID, Bacteria ESCHERICHIA COLI  Final      Susceptibility   Escherichia coli - MIC*    AMPICILLIN >=32 RESISTANT Resistant     CEFAZOLIN 8 SENSITIVE Sensitive     CEFEPIME <=0.12 SENSITIVE Sensitive     CEFTAZIDIME <=1 SENSITIVE Sensitive     CEFTRIAXONE 0.5 SENSITIVE Sensitive     CIPROFLOXACIN <=0.25 SENSITIVE Sensitive     GENTAMICIN <=1 SENSITIVE Sensitive     IMIPENEM <=0.25 SENSITIVE Sensitive     TRIMETH/SULFA <=20 SENSITIVE Sensitive     AMPICILLIN/SULBACTAM >=32 RESISTANT Resistant     PIP/TAZO 8 SENSITIVE Sensitive     * ESCHERICHIA COLI  Blood Culture ID Panel (Reflexed)     Status: Abnormal   Collection Time: 05/12/21  9:19 PM  Result Value Ref Range Status   Enterococcus faecalis NOT DETECTED NOT DETECTED Final   Enterococcus Faecium NOT DETECTED NOT DETECTED Final   Listeria monocytogenes NOT DETECTED NOT DETECTED Final   Staphylococcus species NOT DETECTED NOT DETECTED Final   Staphylococcus aureus (BCID) NOT DETECTED NOT DETECTED Final   Staphylococcus epidermidis NOT DETECTED NOT DETECTED Final   Staphylococcus lugdunensis NOT DETECTED NOT DETECTED Final   Streptococcus species NOT DETECTED NOT DETECTED Final   Streptococcus agalactiae NOT DETECTED NOT DETECTED Final   Streptococcus pneumoniae NOT DETECTED NOT DETECTED Final   Streptococcus pyogenes NOT DETECTED NOT DETECTED Final   A.calcoaceticus-baumannii NOT DETECTED NOT DETECTED Final   Bacteroides fragilis NOT DETECTED NOT DETECTED Final   Enterobacterales DETECTED (A) NOT DETECTED Final     Comment: Enterobacterales represent a large order of gram negative bacteria, not a single organism. CRITICAL RESULT CALLED TO, READ BACK BY AND VERIFIED WITH: PHARMD E MARTIN [id number] AR 1457 BY CM    Enterobacter cloacae complex NOT DETECTED NOT DETECTED Final   Escherichia coli DETECTED (A) NOT DETECTED Final    Comment: CRITICAL RESULT CALLED TO, READ BACK BY AND VERIFIED WITH: PHARMD E MARTIN [id number] AT 1457 BY CM    Klebsiella aerogenes NOT  DETECTED NOT DETECTED Final   Klebsiella oxytoca NOT DETECTED NOT DETECTED Final   Klebsiella pneumoniae NOT DETECTED NOT DETECTED Final   Proteus species NOT DETECTED NOT DETECTED Final   Salmonella species NOT DETECTED NOT DETECTED Final   Serratia marcescens NOT DETECTED NOT DETECTED Final   Haemophilus influenzae NOT DETECTED NOT DETECTED Final   Neisseria meningitidis NOT DETECTED NOT DETECTED Final   Pseudomonas aeruginosa NOT DETECTED NOT DETECTED Final   Stenotrophomonas maltophilia NOT DETECTED NOT DETECTED Final   Candida albicans NOT DETECTED NOT DETECTED Final   Candida auris NOT DETECTED NOT DETECTED Final   Candida glabrata NOT DETECTED NOT DETECTED Final   Candida krusei NOT DETECTED NOT DETECTED Final   Candida parapsilosis NOT DETECTED NOT DETECTED Final   Candida tropicalis NOT DETECTED NOT DETECTED Final   Cryptococcus neoformans/gattii NOT DETECTED NOT DETECTED Final   CTX-M ESBL NOT DETECTED NOT DETECTED Final   Carbapenem resistance IMP NOT DETECTED NOT DETECTED Final   Carbapenem resistance KPC NOT DETECTED NOT DETECTED Final   Carbapenem resistance NDM NOT DETECTED NOT DETECTED Final   Carbapenem resist OXA 48 LIKE NOT DETECTED NOT DETECTED Final   Carbapenem resistance VIM NOT DETECTED NOT DETECTED Final    Comment: Performed at [facility], [address], [place], [place] [address]  Blood Culture (routine x 2)     Status: Abnormal   Collection Time: 05/12/21  9:37 PM   Specimen: BLOOD  Result Value Ref Range  Status   Specimen Description BLOOD SITE NOT SPECIFIED  Final   Special Requests   Final    BOTTLES DRAWN AEROBIC AND ANAEROBIC Blood Culture adequate volume   Culture  Setup Time (A)  Final    GRAM VARIABLE ROD IN BOTH AEROBIC AND ANAEROBIC BOTTLES CRITICAL VALUE NOTED.  VALUE IS CONSISTENT WITH PREVIOUSLY REPORTED AND CALLED VALUE.    Culture (A)  Final    ESCHERICHIA COLI SUSCEPTIBILITIES PERFORMED ON PREVIOUS CULTURE WITHIN THE LAST 5 DAYS. Performed at [facility], [place] [address], [place], [place] [address]    Report Status 05/15/2021 FINAL  Final  Urine Culture     Status: None   Collection Time: 05/13/21  3:06 AM   Specimen: Urine, Clean Catch  Result Value Ref Range Status   Specimen Description URINE, CLEAN CATCH  Final   Special Requests NONE  Final   Culture   Final    NO GROWTH Performed at [facility], [place] [address] [place], [place] [address]    Report Status 05/14/2021 FINAL  Final         Radiology Studies: No results found.      Scheduled Meds:  aspirin EC  81 mg Oral Daily   atorvastatin  20 mg Oral Daily   donepezil  10 mg Oral QHS   enoxaparin (LOVENOX) injection  75 mg Subcutaneous Q12H   memantine  10 mg Oral BID   senna-docusate  2 tablet Oral BID   Continuous Infusions:  cefTRIAXone (ROCEPHIN)  IV 2 g (05/15/21 1807)   lactated ringers 75 mL/hr at 05/16/21 2130   metronidazole 500 mg (05/16/21 0825)     LOS: 3 days      Phillips Climes, MD [facility]   To contact the attending provider between 7A-7P or the covering provider during after hours 7P-7A, please log into the web site [email or web address] and access using universal [place] password for that web site. If you do not have the password,  please call the hospital operator.  05/16/2021, 3:13 PM

## 2021-05-16 NOTE — Progress Notes (Addendum)
Subjective: Patient states abdominal pain is a bit better.  Eating some but not much.  Can't tell if this is making his pain any different.  No nausea or vomiting  ROS: See above, otherwise other systems negative  Objective: Vital signs in last 24 hours: Temp:  [98 F (36.7 C)-99 F (37.2 C)] 98 F (36.7 C) (12/04 2355) Pulse Rate:  [92-102] 100 (12/04 2355) Resp:  [20] 20 (12/04 2355) BP: (92-142)/(56-77) 92/56 (12/04 2355) SpO2:  [87 %-100 %] 95 % (12/04 2355) Last BM Date: 05/14/21  Intake/Output from previous day: 12/04 0701 - 12/05 0700 In: 3331.3 [P.O.:480; I.V.:2451.2; IV Piggyback:400] Out: 800 [Urine:800] Intake/Output this shift: No intake/output data recorded.  PE: Gen: non-ill appearing Heart: regular Lungs: CTAB Abd: soft, seems essentially NT currently, +BS, ND  Lab Results:  Recent Labs    05/15/21 0107 05/16/21 0050  WBC 8.0 6.8  HGB 12.8* 13.1  HCT 38.8* 38.4*  PLT 146* 149*   BMET Recent Labs    05/15/21 0107 05/16/21 0050  NA 131* 130*  K 3.1* 4.0  CL 98 101  CO2 25 23  GLUCOSE 86 90  BUN 7* 5*  CREATININE 0.83 0.69  CALCIUM 8.1* 8.0*   PT/INR No results for input(s): LABPROT, INR in the last 72 hours. CMP     Component Value Date/Time   NA 130 (L) 05/16/2021 0050   K 4.0 05/16/2021 0050   CL 101 05/16/2021 0050   CO2 23 05/16/2021 0050   GLUCOSE 90 05/16/2021 0050   BUN 5 (L) 05/16/2021 0050   CREATININE 0.69 05/16/2021 0050   CREATININE 0.81 11/03/2015 0918   CALCIUM 8.0 (L) 05/16/2021 0050   PROT 5.6 (L) 05/14/2021 0632   PROT 7.5 12/17/2019 0831   ALBUMIN 2.6 (L) 05/14/2021 0632   AST 41 05/14/2021 0632   ALT 21 05/14/2021 0632   ALKPHOS 74 05/14/2021 0632   BILITOT 2.2 (H) 05/14/2021 0632   GFRNONAA >60 05/16/2021 0050   GFRNONAA 88 11/03/2015 0918   GFRAA >60 02/29/2020 0054   GFRAA >89 11/03/2015 0918   Lipase     Component Value Date/Time   LIPASE 28 04/03/2021 2011       Studies/Results: No  results found.  Anti-infectives: Anti-infectives (From admission, onward)    Start     Dose/Rate Route Frequency Ordered Stop   05/14/21 1800  cefTRIAXone (ROCEPHIN) 2 g in sodium chloride 0.9 % 100 mL IVPB        2 g 200 mL/hr over 30 Minutes Intravenous Every 24 hours 05/14/21 1429     05/14/21 1600  metroNIDAZOLE (FLAGYL) IVPB 500 mg        500 mg 100 mL/hr over 60 Minutes Intravenous Every 8 hours 05/14/21 1429     05/13/21 0600  piperacillin-tazobactam (ZOSYN) IVPB 3.375 g  Status:  Discontinued        3.375 g 12.5 mL/hr over 240 Minutes Intravenous Every 8 hours 05/13/21 0305 05/14/21 1429   05/12/21 2130  piperacillin-tazobactam (ZOSYN) IVPB 3.375 g        3.375 g 100 mL/hr over 30 Minutes Intravenous  Once 05/12/21 2124 05/12/21 2234   05/12/21 2115  ceFEPIme (MAXIPIME) 2 g in sodium chloride 0.9 % 100 mL IVPB  Status:  Discontinued        2 g 200 mL/hr over 30 Minutes Intravenous  Once 05/12/21 2114 05/12/21 2124   05/12/21 2115  metroNIDAZOLE (FLAGYL) IVPB 500 mg  Status:  Discontinued        500 mg 100 mL/hr over 60 Minutes Intravenous  Once 05/12/21 2114 05/12/21 2124   05/12/21 2115  vancomycin (VANCOREADY) IVPB 1500 mg/300 mL  Status:  Discontinued        1,500 mg 150 mL/hr over 120 Minutes Intravenous  Once 05/12/21 2114 05/12/21 2124        Assessment/Plan Gallbladder wall thickening, ? Cholecystitis/ E coli Bacteremia/upper abdominal adenopathy -imaging not definitively clear on whether the patient has cholecystitis or not.   -discussed imaging further with IR, Dr. Earleen Newport.  Will order CA 19-9 and AFP.  He is concerned with the LNs and appearance that this may reflect malignancy over chronic cholecystitis.   -no role for HIDA at this time. -no role for perc chole drain if infection due to under distention -will discuss with MD but may need GI consult for EUS for biopsy of LNs.  Would attempt this type of biopsy prior to liver biopsy as this is not very well  visible on [place].    FEN - HH, IVFs VTE - lovenox 75mg  ID - Rocephin/Flagyl  Renal mass - per urology Small PE - on therapeutic lovenox  LOS: 3 days    Henreitta Cea , [facility] 05/16/2021, 9:04 AM Please see Amion for pager number during day hours 7:00am-4:30pm or 7:00am -11:30am on weekends

## 2021-05-17 ENCOUNTER — Telehealth: Payer: Self-pay

## 2021-05-17 LAB — CBC
HCT: 39.4 % (ref 39.0–52.0)
Hemoglobin: 13.4 g/dL (ref 13.0–17.0)
MCH: 29.7 pg (ref 26.0–34.0)
MCHC: 34 g/dL (ref 30.0–36.0)
MCV: 87.4 fL (ref 80.0–100.0)
Platelets: 155 10*3/uL (ref 150–400)
RBC: 4.51 MIL/uL (ref 4.22–5.81)
RDW: 15.4 % (ref 11.5–15.5)
WBC: 7.9 10*3/uL (ref 4.0–10.5)
nRBC: 0 % (ref 0.0–0.2)

## 2021-05-17 LAB — HEPATITIS A ANTIBODY, IGM: Hep A IgM: NONREACTIVE

## 2021-05-17 LAB — HEPATITIS B SURFACE ANTIBODY, QUANTITATIVE: Hep B S AB Quant (Post): 15 m[IU]/mL (ref >9.9)

## 2021-05-17 LAB — CANCER ANTIGEN 19-9: CA 19-9: 21 U/mL (ref 0–35)

## 2021-05-17 LAB — RPR: RPR Ser Ql: NONREACTIVE

## 2021-05-17 LAB — AFP TUMOR MARKER: AFP, Serum, Tumor Marker: 7.7 ng/mL (ref 0.0–8.4)

## 2021-05-17 MED ORDER — SODIUM CHLORIDE 0.9 % IV SOLN: INTRAVENOUS | Status: DC

## 2021-05-17 MED ORDER — SODIUM CHLORIDE 0.9 % IV SOLN: INTRAVENOUS | Status: DC | PRN

## 2021-05-17 MED ORDER — ENOXAPARIN SODIUM 80 MG/0.8ML IJ SOSY
75.0000 mg | PREFILLED_SYRINGE | Freq: Two times a day (BID) | INTRAMUSCULAR | Status: DC
  Administered 2021-05-18 – 2021-05-20 (×4): 75 mg via SUBCUTANEOUS
  Filled 2021-05-17 (×4): qty 0.8

## 2021-05-17 NOTE — Consult Note (Signed)
[facility] for Infectious Disease    Date of Admission:  05/12/2021   Total days of inpatient antibiotics 5        Reason for Consult: 5    Principal Problem:   Sepsis ([place]) Active Problems:   Essential hypertension, benign   GERD (gastroesophageal reflux disease)   Hyperlipidemia   Acute cholecystitis   Pulmonary embolism [facility])   Assessment: 80 year old male with rectal cancer status post excision about 10 years ago, CAD, TIA admitted for sepsis secondary to E. coli bacteremia. PT had rigors and diarrhea, abdominal pain leading up to admission.   #E coli bacteremia 2/2 cholecystitis/intraabdominal infection #Concern for malignancy, renal mass, necrotic lymphadenopathy, Hx of rectal cancer -CT chest showed small subsegmental PE, 1.1 cm non calcified lung nodule LUL -CT abdomen pelvis showed concern for cholecystitis, necrotic abdominal lymphadenopathy, renal mass suspicious for renal cancer -MRCP showed chronic cholecystitis with cholelithiasis, necrotic adenopathy, renal mass consistent with renal cancer. General surgery consulted and no plans for intervention  as imaging is unclear if cholecystitis present.  Recommend GI engagement for EUS and LN bx.  -During the 04/06/21 visit with Dr. Jeneen Rinks Jones(PCP) pt was noted to have 20 lb wight loss in 2 months. He had followed up with urology who had recommend PET scan. Given his Hs of rectal Cx, weight loss and renal lesion suspect the necrotic lymph nodes are due malignancy. Of note he has a history of epigastric pain for the past few months.  -Would still cover for intraabdominal infection as liver lesion(3x2.6cm) on MRCP is new since 04/03/21 imaging.  Recommendations:  -Continue ceftriaxone and metronidazole for intraabdominal infection. Since liver lesion is new and chronic choly seen on imaging would suspect a likely infectious etiology. I reviewed imaging with Dr. Jobe Igo radiology. Although malignancy is in the  differential.  -Agree with gastroenterology consult for EUS and LN Bx with path and Cx(Bacterial/afb/fungal).  -Follow-up IGRA, HepA IgM, RPR. Hep B naturally immune, HIV negative  Microbiology:   Antibiotics: Pip-tazo 12/1-12/3 Ceftriaxone 12/3-p Metronidazole 12/3-p  Cultures: Blood 12/1 2/2 Ecoli Urine 12/1 no growth    HPI: Andre [name] is a 80 y.o. male, CAD, hypertension, rectal cancer status post excision, TIA presented to the ED with rigors that started around 3PM.  Patient also said abdominal pain and diarrhea for the 2 weeks prior to admission.  Diagnosed with cholelithiasis 2 weeks ago possibility of gallbladder removal.  On arrival to the ED temp of 100.1 CT show 100.6.  CT showed subsegmental PE, suspicion for acute cholecystitis necrotic lymph nodes.  General surgery was consulted and recommended MRCP.  MRCP showed chronic cholecystitis.  No plans for surgery, recommended GI consult for EUS for lymph nodes.  ID consulted as patient found to have E. coli bacteremia Today, patient resting in bed.  He reports he has had abdominal pain for the past few months.  He is a poor historian.  At first he denied having rectal cancer, then he states it was 15 year ago for which he recieved excisional therapy.    Review of Systems: Review of Systems  All other systems reviewed and are negative.  Past Medical History:  Diagnosis Date   Arthritis    Coronary artery disease, non-occlusive    a. 06/2011: cath for abnormal exercise echo: Only 30-40% proximal RCA. Otherwise normal. b. 10/2015: cath for abnormal NST and atypical CP --> showed mild 30-40% stenosis in the prox-distal RCA   GERD (gastroesophageal  reflux disease)    HA (headache)    Hypertension    Memory loss    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HCC)    TIA (transient ischemic attack)    Weakness     Social History   Tobacco Use   Smoking status: Some Days    Packs/day: 0.25    Types: Cigarettes   Smokeless tobacco: Never  Vaping  Use   Vaping Use: Never used  Substance Use Topics   Alcohol use: Yes    Comment: drinks "every now and then"   Drug use: No    Family History  Problem Relation Age of Onset   Hypertension Mother    Cancer Mother    Liver disease Father    Scheduled Meds:  aspirin EC  81 mg Oral Daily   atorvastatin  20 mg Oral Daily   donepezil  10 mg Oral QHS   enoxaparin (LOVENOX) injection  75 mg Subcutaneous Q12H   memantine  10 mg Oral BID   metoprolol tartrate  12.5 mg Oral BID   senna-docusate  2 tablet Oral BID   Continuous Infusions:  sodium chloride     cefTRIAXone (ROCEPHIN)  IV Stopped (05/16/21 1900)   lactated ringers 75 mL/hr at 05/17/21 0700   metronidazole 500 mg (05/17/21 0820)   PRN Meds:.sodium chloride, acetaminophen **OR** [DISCONTINUED] acetaminophen, ondansetron **OR** ondansetron (ZOFRAN) IV, oxyCODONE, tiZANidine No Known Allergies  OBJECTIVE: Blood pressure 101/87, pulse 77, temperature 98 F (36.7 C), temperature source Oral, resp. rate 20, height 5\' 10"  (1.778 m), weight 73.2 kg, SpO2 92 %.  Physical Exam Constitutional:      General: He is not in acute distress.    Appearance: He is normal weight. He is not toxic-appearing.  HENT:     Head: Normocephalic and atraumatic.     Right Ear: External ear normal.     Left Ear: External ear normal.     Nose: No congestion or rhinorrhea.     Mouth/Throat:     Mouth: Mucous membranes are moist.     Pharynx: Oropharynx is clear.  Eyes:     Extraocular Movements: Extraocular movements intact.     Conjunctiva/sclera: Conjunctivae normal.     Pupils: Pupils are equal, round, and reactive to light.  Cardiovascular:     Rate and Rhythm: Normal rate and regular rhythm.     Heart sounds: No murmur heard.   No friction rub. No gallop.  Pulmonary:     Effort: Pulmonary effort is normal.     Breath sounds: Normal breath sounds.  Abdominal:     General: Abdomen is flat. Bowel sounds are normal.     Palpations:  Abdomen is soft.  Musculoskeletal:        General: No swelling. Normal range of motion.     Cervical back: Normal range of motion and neck supple.  Skin:    General: Skin is warm and dry.  Neurological:     General: No focal deficit present.     Mental Status: He is oriented to person, place, and time.  Psychiatric:        Mood and Affect: Mood normal.    Lab Results Lab Results  Component Value Date   WBC 7.9 05/17/2021   HGB 13.4 05/17/2021   HCT 39.4 05/17/2021   MCV 87.4 05/17/2021   PLT 155 05/17/2021    Lab Results  Component Value Date   CREATININE 0.69 05/16/2021   BUN 5 (L) 05/16/2021   NA 130 (L)  05/16/2021   K 4.0 05/16/2021   CL 101 05/16/2021   CO2 23 05/16/2021    Lab Results  Component Value Date   ALT 21 05/14/2021   AST 41 05/14/2021   ALKPHOS 74 05/14/2021   BILITOT 2.2 (H) 05/14/2021       Laurice Record, [place] [facility] 05/17/2021, 10:12 AM

## 2021-05-17 NOTE — Progress Notes (Signed)
[facility] for Infectious Disease  Date of Admission:  05/12/2021   Total days of inpatient antibiotics 6  Principal Problem:   Sepsis ([place]) Active Problems:   Essential hypertension, benign   GERD (gastroesophageal reflux disease)   Hyperlipidemia   Acute cholecystitis   Pulmonary embolism [facility])          Assessment: 80 year old male with rectal cancer status post excision about 10 years ago, CAD, TIA admitted for sepsis secondary to E. coli bacteremia. PT had rigors and diarrhea, abdominal pain leading up to admission.    #E coli bacteremia 2/2 cholecystitis/intraabdominal infection #Concern for malignancy, renal mass, necrotic lymphadenopathy, Hx of rectal cancer -CT chest showed small subsegmental PE, 1.1 cm non calcified lung nodule LUL -CT abdomen pelvis showed concern for cholecystitis, necrotic abdominal lymphadenopathy, renal mass suspicious for renal cancer -MRCP showed chronic cholecystitis with cholelithiasis, necrotic adenopathy, renal mass consistent with renal cancer. General surgery consulted and no plans for intervention  as imaging is unclear if cholecystitis present.  Recommend GI engagement for EUS and LN bx.  -During the 04/06/21 visit with Dr. Jeneen Rinks Jones(PCP) pt was noted to have 20 lb wight loss in 2 months. He had followed up with urology who had recommend PET scan. Given his Hs of rectal Cx, weight loss(pt reprots he ate less due to abdominal pain) and renal lesion suspect the necrotic lymph nodes are due malignancy. Of note he has a history of epigastric pain for the past few months.  -Would still cover for intraabdominal infection as liver lesion(3x2.6cm) on MRCP is new since 04/03/21 imaging.   Recommendations:  -Continue ceftriaxone and metronidazole for intraabdominal infection. Since liver lesion is new and chronic choly seen on imaging would suspect a likely infectious etiology. I reviewed imaging with Dr. Jobe Igo radiology. Although  malignancy is in the differential.  -Agree with gastroenterology consult for EUS and LN Bx with path and Cx(Bacterial/afb/fungal).  -Follow-up IGRA, HepA IgM, RPR. Hep B naturally immune, HIV negative  Microbiology:   Antibiotics: Pip-tazo 12/1-12/3 Ceftriaxone 12/3-p Metronidazole 12/3-p   Cultures: Blood 12/1 2/2 Ecoli Urine 12/1 no growth  SUBJECTIVE: Pt is resting in bed. No new complaints. NO significant overnight events.   Review of Systems: Review of Systems  All other systems reviewed and are negative.   Scheduled Meds:  aspirin EC  81 mg Oral Daily   atorvastatin  20 mg Oral Daily   donepezil  10 mg Oral QHS   enoxaparin (LOVENOX) injection  75 mg Subcutaneous Q12H   memantine  10 mg Oral BID   metoprolol tartrate  12.5 mg Oral BID   senna-docusate  2 tablet Oral BID   Continuous Infusions:  sodium chloride     cefTRIAXone (ROCEPHIN)  IV Stopped (05/16/21 1900)   lactated ringers 75 mL/hr at 05/17/21 0700   metronidazole 500 mg (05/17/21 0820)   PRN Meds:.sodium chloride, acetaminophen **OR** [DISCONTINUED] acetaminophen, ondansetron **OR** ondansetron (ZOFRAN) IV, oxyCODONE, tiZANidine No Known Allergies  OBJECTIVE: Vitals:   05/16/21 2132 05/16/21 2320 05/17/21 0417 05/17/21 0823  BP: 136/78 100/65 115/72 101/87  Pulse: (!) 101 84 75 77  Resp: (!) 21 17 (!) 22 20  Temp:  98.8 F (37.1 C) 98 F (36.7 C) 98 F (36.7 C)  TempSrc:  Oral Oral Oral  SpO2: 98% 95% 94% 92%  Weight:      Height:       Body mass index is 23.16 kg/m.  Physical Exam Constitutional:  General: He is not in acute distress.    Appearance: He is normal weight. He is not toxic-appearing.  HENT:     Head: Normocephalic and atraumatic.     Right Ear: External ear normal.     Left Ear: External ear normal.     Nose: No congestion or rhinorrhea.     Mouth/Throat:     Mouth: Mucous membranes are moist.     Pharynx: Oropharynx is clear.  Eyes:     Extraocular Movements:  Extraocular movements intact.     Conjunctiva/sclera: Conjunctivae normal.     Pupils: Pupils are equal, round, and reactive to light.  Cardiovascular:     Rate and Rhythm: Normal rate and regular rhythm.     Heart sounds: No murmur heard.   No friction rub. No gallop.  Pulmonary:     Effort: Pulmonary effort is normal.     Breath sounds: Normal breath sounds.  Abdominal:     General: Abdomen is flat. Bowel sounds are normal.     Palpations: Abdomen is soft.  Musculoskeletal:        General: No swelling. Normal range of motion.     Cervical back: Normal range of motion and neck supple.  Skin:    General: Skin is warm and dry.  Neurological:     General: No focal deficit present.     Mental Status: He is oriented to person, place, and time.  Psychiatric:        Mood and Affect: Mood normal.      Lab Results Lab Results  Component Value Date   WBC 7.9 05/17/2021   HGB 13.4 05/17/2021   HCT 39.4 05/17/2021   MCV 87.4 05/17/2021   PLT 155 05/17/2021    Lab Results  Component Value Date   CREATININE 0.69 05/16/2021   BUN 5 (L) 05/16/2021   NA 130 (L) 05/16/2021   K 4.0 05/16/2021   CL 101 05/16/2021   CO2 23 05/16/2021    Lab Results  Component Value Date   ALT 21 05/14/2021   AST 41 05/14/2021   ALKPHOS 74 05/14/2021   BILITOT 2.2 (H) 05/14/2021        Laurice Record, [facility] 05/17/2021, 10:19 AM

## 2021-05-17 NOTE — Telephone Encounter (Signed)
-----   Message from Beryle Flock, RN sent at 05/16/2021 11:24 AM EST ----- Regarding: FW: malaria  ----- Message ----- From: Laurice Record, MD Sent: 05/16/2021  11:21 AM EST To: [facility] Triage Nurse Pool Subject: malaria                                        Good morning,  PT needs a hospital follow-up for Friday/Thursday this week.

## 2021-05-17 NOTE — Progress Notes (Signed)
Patient awaiting EUS tomorrow for LN biopsies to help further determine pathology of these processes noted on imaging.  Incidentally also noted to be Hep A positive yesterday as well.  CA19-9 and AFP normal.  Continue current care today and await biopsies tomorrow.  Henreitta Cea 8:26 AM 05/17/2021

## 2021-05-17 NOTE — Progress Notes (Signed)
0900: Patient unable to find his phone. Nursing and NT try to help patient find his phone. His daughter has been attempting to call his phone, but we can't hear a ring tone. Patient thinks maybe his phone got lost in the laundry when his bed was changed overnight. Email sent to laundry services with the description of patient's phone.

## 2021-05-17 NOTE — Progress Notes (Signed)
PROGRESS NOTE    Andre [name]  KLK:[id number] DOB: [birth year] DOA: 05/12/2021 PCP: Biagio Borg, MD    Chief Complaint  Patient presents with   Tremors   Code Sepsis    Brief Narrative:   Andre [name]  is a 80 y.o. male, with history of GERD, coronary artery disease, hypertension, rectal cancer, TIA, weakness, presents ED with a chief complaint of rigors, fever, patient endorses abdominal pain for few weeks now, his work-up in ED significant for acute cholecystitis.  Assessment & Plan:   Principal Problem:   Sepsis ([place]) Active Problems:   Essential hypertension, benign   GERD (gastroesophageal reflux disease)   Hyperlipidemia   Acute cholecystitis   Pulmonary embolism ([facility])   Sepsis secondary to cholecystitis/E. coli bacteremia -Sepsis present on admission, as febrile 102.6, tachycardic 129, tachypneic 24, hypotensive 88/70 -Work-up significant for gallbladder wall thickening, with questionable cholecystitis, as well with evidence of E. coli bacteremia, upper abdominal adenopathy, MRI concerning for cholecystitis.  -Mangement per general surgery, for now continue with antibiotics. -As well some of the clinical picture suspicious for malignancy given surrounding lymphadenopathy, and gallbladder is not distended on imaging, so GI consulted, plan for EGD EUS with biopsy of surrounding lymph nodes by Dr Benson Norway tomorrow , attempt lymph node biopsy prior to liver biopsy which can be done by IR . - there is no role for percutaneous cholecystostomy drain as no evidence of gallbladder distention. -Patient on IV Zosyn initially, did narrow to IV Rocephin and Flagyl.  Small PE -Continue with Lovenox - no DVT on venous dopplers -Will hold Lovenox this evening and tomorrow morning in anticipation of EUS, will resume after that when okay by GI.  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  -Blood pressure started to increase , start on low-dose metoprolol.  HLD - Continue Statin  Right kidney lesion - followed by Dr Abner Greenspan, high suspension for malignancy   Dementia - Continue aricept and namenda  1.1 cm noncalcified lung nodule within the posterior aspect of the left upper lobe. - will need repeat CT in 3 months.  Hypokalemia -We will replete.  DVT prophylaxis: heparin GTT>> Lovenox full dose anticoagulation, currently on hold for EUS tomorrow, resume after procedure when ok by GI. Code Status: Full Family Communication: None at bedside , left daughter a voicmail 12/5 Disposition:   Status is: Inpatient  Remains inpatient appropriate because: sepsis       Consultants:  General surgery ID GI  Subjective:  Patient denies abdominal pain today, report he is feeling better today, no nausea, no vomiting  Objective: Vitals:   05/17/21 0417 05/17/21 0823 05/17/21 1157 05/17/21 1607  BP: 115/72 101/87 117/75 118/82  Pulse: 75 77 80 87  Resp: (!) 22 20 (!) 25 (!) 23  Temp: 98 F (36.7 C) 98 F (36.7 C)    TempSrc: Oral Oral    SpO2: 94% 92% 95% 94%  Weight:      Height:        Intake/Output Summary (Last 24 hours) at 05/17/2021 1929 Last data filed at 05/17/2021 1602 Gross per 24 hour  Intake 1780.56 ml  Output 450 ml  Net 1330.56 ml    Filed Weights   05/14/21 0906  Weight: 73.2 kg    Examination:  Awake Alert, Oriented X 3, No  new F.N deficits, Normal affect Symmetrical Chest wall movement, Good air movement bilaterally, CTAB RRR,No Gallops,Rubs or new Murmurs, No Parasternal Heave +ve B.Sounds, Abd Soft, No tenderness, No rebound - guarding or rigidity. No Cyanosis, Clubbing or edema, No new Rash or bruise         Data Reviewed: I have personally reviewed following labs and imaging studies  CBC: Recent Labs  Lab 05/12/21 1948 05/13/21 0508  05/14/21 0632 05/15/21 0107 05/16/21 0050 05/17/21 0111  WBC 8.9 10.5 8.6 8.0 6.8 7.9  NEUTROABS 7.6 9.6*  --   --   --   --   HGB 16.7 14.1 13.5 12.8* 13.1 13.4  HCT 51.5 42.5 40.1 38.8* 38.4* 39.4  MCV 92.0 89.9 88.5 89.2 87.9 87.4  PLT 225 171 150 146* 149* 155     Basic Metabolic Panel: Recent Labs  Lab 05/12/21 1948 05/13/21 0508 05/13/21 0632 05/14/21 0632 05/15/21 0107 05/16/21 0050  NA 136 131*  --  131* 131* 130*  K 4.3 5.2* 3.6 3.7 3.1* 4.0  CL 96* 97*  --  100 98 101  CO2 30 27  --  23 25 23   GLUCOSE 111* 96  --  96 86 90  BUN 10 10  --  7* 7* 5*  CREATININE 0.94 0.78  --  0.88 0.83 0.69  CALCIUM 10.1 8.7*  --  8.6* 8.1* 8.0*  MG  --  1.7  --   --   --   --      GFR: Estimated Creatinine Clearance: 77.3 mL/min (by C-G formula based on SCr of 0.69 mg/dL).  Liver Function Tests: Recent Labs  Lab 05/12/21 1948 05/13/21 0508 05/14/21 0632  AST 26 44* 41  ALT 20 22 21   ALKPHOS 116 94 74  BILITOT 3.5* 3.5* 2.2*  PROT 8.1 6.1* 5.6*  ALBUMIN 4.1 3.0* 2.6*     CBG: No results for input(s): GLUCAP in the last 168 hours.   Recent Results (from the past 240 hour(s))  Resp Panel by RT-PCR (Flu A&B, Covid) Nasopharyngeal Swab     Status: None   Collection Time: 05/12/21  9:14 PM   Specimen: Nasopharyngeal Swab; Nasopharyngeal(NP) swabs in vial transport medium  Result Value Ref Range Status   SARS Coronavirus 2 by RT PCR NEGATIVE NEGATIVE Final    Comment: (NOTE) SARS-CoV-2 target nucleic acids are NOT DETECTED.  The SARS-CoV-2 RNA is generally detectable in upper respiratory specimens during the acute phase of infection. The lowest concentration of SARS-CoV-2 viral copies this assay can detect is 138 copies/mL. A negative result does not preclude SARS-Cov-2 infection and should not be used as the sole basis for treatment or other patient management decisions. A negative result may occur with  improper specimen collection/handling, submission of  specimen other than nasopharyngeal swab, presence of viral mutation(s) within the areas targeted by this assay, and inadequate number of viral copies(<138 copies/mL). A negative result must be combined with clinical observations, patient history, and epidemiological information. The expected result is Negative.  Fact Sheet for Patients:  EntrepreneurPulse.com.au  Fact Sheet for Healthcare Providers:  IncredibleEmployment.be  This test is no t yet approved or cleared by the [place] FDA and  has been authorized for detection and/or diagnosis of SARS-CoV-2 by FDA under an Emergency Use Authorization (EUA). This EUA will remain  in effect (meaning this test can be used) for the duration of the COVID-19 declaration under Section 564(b)(1) of the Act, 21 U.S.C.section 360bbb-3(b)(1), unless the authorization is  terminated  or revoked sooner.       Influenza A by PCR NEGATIVE NEGATIVE Final   Influenza B by PCR NEGATIVE NEGATIVE Final    Comment: (NOTE) The Xpert Xpress SARS-CoV-2/FLU/RSV plus assay is intended as an aid in the diagnosis of influenza from Nasopharyngeal swab specimens and should not be used as a sole basis for treatment. Nasal washings and aspirates are unacceptable for Xpert Xpress SARS-CoV-2/FLU/RSV testing.  Fact Sheet for Patients: EntrepreneurPulse.com.au  Fact Sheet for Healthcare Providers: IncredibleEmployment.be  This test is not yet approved or cleared by the [place] FDA and has been authorized for detection and/or diagnosis of SARS-CoV-2 by FDA under an Emergency Use Authorization (EUA). This EUA will remain in effect (meaning this test can be used) for the duration of the COVID-19 declaration under Section 564(b)(1) of the Act, 21 U.S.C. section 360bbb-3(b)(1), unless the authorization is terminated or revoked.  Performed at [facility], [place] [address]  [place], [place] [address]   Blood Culture (routine x 2)     Status: Abnormal   Collection Time: 05/12/21  9:19 PM   Specimen: BLOOD  Result Value Ref Range Status   Specimen Description BLOOD SITE NOT SPECIFIED  Final   Special Requests   Final    BOTTLES DRAWN AEROBIC AND ANAEROBIC Blood Culture adequate volume   Culture  Setup Time (A)  Final    GRAM VARIABLE ROD IN BOTH AEROBIC AND ANAEROBIC BOTTLES CRITICAL RESULT CALLED TO, READ BACK BY AND VERIFIED WITH: PHARMD E MARTIN [id number] AT 1447 BY CM Performed at [facility], [place] [address], [place], [place] [address]    Culture ESCHERICHIA COLI (A)  Final   Report Status 05/16/2021 FINAL  Final   Organism ID, Bacteria ESCHERICHIA COLI  Final      Susceptibility   Escherichia coli - MIC*    AMPICILLIN >=32 RESISTANT Resistant     CEFAZOLIN 8 SENSITIVE Sensitive     CEFEPIME <=0.12 SENSITIVE Sensitive     CEFTAZIDIME <=1 SENSITIVE Sensitive     CEFTRIAXONE 0.5 SENSITIVE Sensitive     CIPROFLOXACIN <=0.25 SENSITIVE Sensitive     GENTAMICIN <=1 SENSITIVE Sensitive     IMIPENEM <=0.25 SENSITIVE Sensitive     TRIMETH/SULFA <=20 SENSITIVE Sensitive     AMPICILLIN/SULBACTAM >=32 RESISTANT Resistant     PIP/TAZO 8 SENSITIVE Sensitive     * ESCHERICHIA COLI  Blood Culture ID Panel (Reflexed)     Status: Abnormal   Collection Time: 05/12/21  9:19 PM  Result Value Ref Range Status   Enterococcus faecalis NOT DETECTED NOT DETECTED Final   Enterococcus Faecium NOT DETECTED NOT DETECTED Final   Listeria monocytogenes NOT DETECTED NOT DETECTED Final   Staphylococcus species NOT DETECTED NOT DETECTED Final   Staphylococcus aureus (BCID) NOT DETECTED NOT DETECTED Final   Staphylococcus epidermidis NOT DETECTED NOT DETECTED Final   Staphylococcus lugdunensis NOT DETECTED NOT DETECTED Final   Streptococcus species NOT DETECTED NOT DETECTED Final   Streptococcus agalactiae NOT DETECTED NOT DETECTED Final   Streptococcus pneumoniae NOT DETECTED  NOT DETECTED Final   Streptococcus pyogenes NOT DETECTED NOT DETECTED Final   A.calcoaceticus-baumannii NOT DETECTED NOT DETECTED Final   Bacteroides fragilis NOT DETECTED NOT DETECTED Final   Enterobacterales DETECTED (A) NOT DETECTED Final    Comment: Enterobacterales represent a large order of gram negative bacteria, not a single organism. CRITICAL RESULT CALLED TO, READ BACK BY AND VERIFIED WITH: PHARMD E MARTIN [id number] AR 1457 BY  CM    Enterobacter cloacae complex NOT DETECTED NOT DETECTED Final   Escherichia coli DETECTED (A) NOT DETECTED Final    Comment: CRITICAL RESULT CALLED TO, READ BACK BY AND VERIFIED WITH: PHARMD E MARTIN [id number] AT 1457 BY CM    Klebsiella aerogenes NOT DETECTED NOT DETECTED Final   Klebsiella oxytoca NOT DETECTED NOT DETECTED Final   Klebsiella pneumoniae NOT DETECTED NOT DETECTED Final   Proteus species NOT DETECTED NOT DETECTED Final   Salmonella species NOT DETECTED NOT DETECTED Final   Serratia marcescens NOT DETECTED NOT DETECTED Final   Haemophilus influenzae NOT DETECTED NOT DETECTED Final   Neisseria meningitidis NOT DETECTED NOT DETECTED Final   Pseudomonas aeruginosa NOT DETECTED NOT DETECTED Final   Stenotrophomonas maltophilia NOT DETECTED NOT DETECTED Final   Candida albicans NOT DETECTED NOT DETECTED Final   Candida auris NOT DETECTED NOT DETECTED Final   Candida glabrata NOT DETECTED NOT DETECTED Final   Candida krusei NOT DETECTED NOT DETECTED Final   Candida parapsilosis NOT DETECTED NOT DETECTED Final   Candida tropicalis NOT DETECTED NOT DETECTED Final   Cryptococcus neoformans/gattii NOT DETECTED NOT DETECTED Final   CTX-M ESBL NOT DETECTED NOT DETECTED Final   Carbapenem resistance IMP NOT DETECTED NOT DETECTED Final   Carbapenem resistance KPC NOT DETECTED NOT DETECTED Final   Carbapenem resistance NDM NOT DETECTED NOT DETECTED Final   Carbapenem resist OXA 48 LIKE NOT DETECTED NOT DETECTED Final   Carbapenem resistance VIM NOT  DETECTED NOT DETECTED Final    Comment: Performed at [facility], [address], [place], [place] [address]  Blood Culture (routine x 2)     Status: Abnormal   Collection Time: 05/12/21  9:37 PM   Specimen: BLOOD  Result Value Ref Range Status   Specimen Description BLOOD SITE NOT SPECIFIED  Final   Special Requests   Final    BOTTLES DRAWN AEROBIC AND ANAEROBIC Blood Culture adequate volume   Culture  Setup Time (A)  Final    GRAM VARIABLE ROD IN BOTH AEROBIC AND ANAEROBIC BOTTLES CRITICAL VALUE NOTED.  VALUE IS CONSISTENT WITH PREVIOUSLY REPORTED AND CALLED VALUE.    Culture (A)  Final    ESCHERICHIA COLI SUSCEPTIBILITIES PERFORMED ON PREVIOUS CULTURE WITHIN THE LAST 5 DAYS. Performed at [facility], [place] [address], [place], [place] [address]    Report Status 05/15/2021 FINAL  Final  Urine Culture     Status: None   Collection Time: 05/13/21  3:06 AM   Specimen: Urine, Clean Catch  Result Value Ref Range Status   Specimen Description URINE, CLEAN CATCH  Final   Special Requests NONE  Final   Culture   Final    NO GROWTH Performed at [facility], [place] [address], [place],  [address]    Report Status 05/14/2021 FINAL  Final          Radiology Studies: No results found.      Scheduled Meds:  aspirin EC  81 mg Oral Daily   atorvastatin  20 mg Oral Daily   donepezil  10 mg Oral QHS   [START ON 05/18/2021] enoxaparin (LOVENOX) injection  75 mg Subcutaneous Q12H   memantine  10 mg Oral BID   metoprolol tartrate  12.5 mg Oral BID   senna-docusate  2 tablet Oral BID   Continuous Infusions:  sodium chloride     sodium chloride     cefTRIAXone (ROCEPHIN)  IV 2 g (05/17/21 1818)  lactated ringers 75 mL/hr at 05/17/21 1146   metronidazole 500 mg (05/17/21 1602)     LOS: 4 days      Phillips Climes, MD [facility]   To contact the attending provider between 7A-7P or the covering provider during after hours 7P-7A, please  log into the web site [email or web address] and access using universal [facility] password for that web site. If you do not have the password, please call the hospital operator.  05/17/2021, 7:29 PM

## 2021-05-17 NOTE — Progress Notes (Signed)
Daugther [name] was updated today, and she would like to be updated tomorrow after procedure, cell [phone], will have rounding team tomorrow to update daughter . Phillips Climes MD

## 2021-05-17 NOTE — Telephone Encounter (Signed)
Left patient a voice mail to call back to schedule a hospital follow up

## 2021-05-17 NOTE — Plan of Care (Signed)
  Problem: Clinical Measurements: Goal: Respiratory complications will improve Outcome: Progressing Goal: Cardiovascular complication will be avoided Outcome: Progressing   

## 2021-05-17 NOTE — Progress Notes (Signed)
ANTICOAGULATION CONSULT NOTE - Follow Up Consult  Pharmacy Consult for Lovenox Indication: pulmonary embolus  No Known Allergies  Patient Measurements: Height: 5\' 10"  (177.8 cm) Weight: 73.2 kg (161 lb 6 oz) IBW/kg (Calculated) : 73  Vital Signs: Temp: 98 F (36.7 C) (12/06 0823) Temp Source: Oral (12/06 0823) BP: 101/87 (12/06 0823) Pulse Rate: 77 (12/06 0823)  Labs: Recent Labs    05/15/21 0107 05/16/21 0050 05/17/21 0111  HGB 12.8* 13.1 13.4  HCT 38.8* 38.4* 39.4  PLT 146* 149* 155  CREATININE 0.83 0.69  --     Estimated Creatinine Clearance: 77.3 mL/min (by C-G formula based on SCr of 0.69 mg/dL).  Assessment:  80 yr old male on therapeutic Lovenox for small PE per CTA on 12/1.  Now planning EGD EUS and lymph node biopsy on 12/7, scheduled for 12n.   Goal of Therapy:  Anti-Xa level 0.6-1 units/ml 4hrs after LMWH dose given Monitor platelets by anticoagulation protocol: Yes   Plan:  Hold Lovenox tonight and 12/7 am per d/w Dr. Waldron Labs Lovenox 75 mg SQ q12h currently scheduled to resume on 12/7 at 8pm. Will follow up for any need to modify timing.  Arty Baumgartner, RPh 05/17/2021,11:27 AM

## 2021-05-17 NOTE — Discharge Summary (Deleted)
PROGRESS NOTE    Andre [name]  SWF:[id number] DOB: [birth year] DOA: 05/12/2021 PCP: Biagio Borg, MD    Chief Complaint  Patient presents with   Tremors   Code Sepsis    Brief Narrative:   Andre [name]  is a 80 y.o. male, with history of GERD, coronary artery disease, hypertension, rectal cancer, TIA, weakness, presents ED with a chief complaint of rigors, fever, patient endorses abdominal pain for few weeks now, his work-up in ED significant for acute cholecystitis.  Assessment & Plan:   Principal Problem:   Sepsis ([place]) Active Problems:   Essential hypertension, benign   GERD (gastroesophageal reflux disease)   Hyperlipidemia   Acute cholecystitis   Pulmonary embolism (HCC)   Sepsis secondary to cholecystitis/E. coli bacteremia -Sepsis present on admission, as febrile 102.6, tachycardic 129, tachypneic 24, hypotensive 88/70 -Work-up significant for gallbladder wall thickening, with questionable cholecystitis, as well with evidence of E. coli bacteremia, upper abdominal adenopathy, MRI concerning for cholecystitis.  -Mangement per general surgery, for now continue with antibiotics. -As well some of the clinical picture suspicious for malignancy given surrounding lymphadenopathy, and gallbladder is not distended on imaging, so GI consulted, plan for EGD EUS with biopsy of surrounding lymph nodes by Dr Benson Norway tomorrow , attempt lymph node biopsy prior to liver biopsy which can be done by IR . - there is no role for percutaneous cholecystostomy drain as no evidence of gallbladder distention. -Patient on IV Zosyn initially, did narrow to IV Rocephin and Flagyl.  Small PE -Continue with Lovenox - no DVT on venous dopplers -Will hold Lovenox this evening and tomorrow morning in anticipation of EUS, will resume after that when okay by GI.  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  -Blood pressure started to increase , start on low-dose metoprolol.  HLD - Continue Statin  Right kidney lesion - followed by Dr Abner Greenspan, high suspension for malignancy   Dementia - Continue aricept and namenda  1.1 cm noncalcified lung nodule within the posterior aspect of the left upper lobe. - will need repeat CT in 3 months.  Hypokalemia -We will replete.  DVT prophylaxis: heparin GTT>> Lovenox full dose anticoagulation, currently on hold for EUS tomorrow, resume after procedure when ok by GI. Code Status: Full Family Communication: None at bedside , left daughter a voicmail 12/5 Disposition:   Status is: Inpatient  Remains inpatient appropriate because: sepsis       Consultants:  General surgery ID GI  Subjective:  Patient denies abdominal pain today, report he is feeling better today, no nausea, no vomiting  Objective: Vitals:   05/16/21 2320 05/17/21 0417 05/17/21 0823 05/17/21 1157  BP: 100/65 115/72 101/87 117/75  Pulse: 84 75 77 80  Resp: 17 (!) 22 20 (!) 25  Temp: 98.8 F (37.1 C) 98 F (36.7 C) 98 F (36.7 C)   TempSrc: Oral Oral Oral   SpO2: 95% 94% 92% 95%  Weight:      Height:        Intake/Output Summary (Last 24 hours) at 05/17/2021 1509 Last data filed at 05/17/2021 0700 Gross per 24 hour  Intake 1149.66 ml  Output 150 ml  Net 999.66 ml   Filed Weights   05/14/21 0906  Weight: 73.2 kg    Examination:  Awake Alert, Oriented X 3,  No new F.N deficits, Normal affect Symmetrical Chest wall movement, Good air movement bilaterally, CTAB RRR,No Gallops,Rubs or new Murmurs, No Parasternal Heave +ve B.Sounds, Abd Soft, No tenderness, No rebound - guarding or rigidity. No Cyanosis, Clubbing or edema, No new Rash or bruise         Data Reviewed: I have personally reviewed following labs and imaging studies  CBC: Recent Labs  Lab 05/12/21 1948  05/13/21 0508 05/14/21 0632 05/15/21 0107 05/16/21 0050 05/17/21 0111  WBC 8.9 10.5 8.6 8.0 6.8 7.9  NEUTROABS 7.6 9.6*  --   --   --   --   HGB 16.7 14.1 13.5 12.8* 13.1 13.4  HCT 51.5 42.5 40.1 38.8* 38.4* 39.4  MCV 92.0 89.9 88.5 89.2 87.9 87.4  PLT 225 171 150 146* 149* 277    Basic Metabolic Panel: Recent Labs  Lab 05/12/21 1948 05/13/21 0508 05/13/21 0632 05/14/21 0632 05/15/21 0107 05/16/21 0050  NA 136 131*  --  131* 131* 130*  K 4.3 5.2* 3.6 3.7 3.1* 4.0  CL 96* 97*  --  100 98 101  CO2 30 27  --  23 25 23   GLUCOSE 111* 96  --  96 86 90  BUN 10 10  --  7* 7* 5*  CREATININE 0.94 0.78  --  0.88 0.83 0.69  CALCIUM 10.1 8.7*  --  8.6* 8.1* 8.0*  MG  --  1.7  --   --   --   --     GFR: Estimated Creatinine Clearance: 77.3 mL/min (by C-G formula based on SCr of 0.69 mg/dL).  Liver Function Tests: Recent Labs  Lab 05/12/21 1948 05/13/21 0508 05/14/21 0632  AST 26 44* 41  ALT 20 22 21   ALKPHOS 116 94 74  BILITOT 3.5* 3.5* 2.2*  PROT 8.1 6.1* 5.6*  ALBUMIN 4.1 3.0* 2.6*    CBG: No results for input(s): GLUCAP in the last 168 hours.   Recent Results (from the past 240 hour(s))  Resp Panel by RT-PCR (Flu A&B, Covid) Nasopharyngeal Swab     Status: None   Collection Time: 05/12/21  9:14 PM   Specimen: Nasopharyngeal Swab; Nasopharyngeal(NP) swabs in vial transport medium  Result Value Ref [place] Status   SARS Coronavirus 2 by RT PCR NEGATIVE NEGATIVE Final    Comment: (NOTE) SARS-CoV-2 target nucleic acids are NOT DETECTED.  The SARS-CoV-2 RNA is generally detectable in upper respiratory specimens during the acute phase of infection. The lowest concentration of SARS-CoV-2 viral copies this assay can detect is 138 copies/mL. A negative result does not preclude SARS-Cov-2 infection and should not be used as the sole basis for treatment or other patient management decisions. A negative result may occur with  improper specimen collection/handling,  submission of specimen other than nasopharyngeal swab, presence of viral mutation(s) within the areas targeted by this assay, and inadequate number of viral copies(<138 copies/mL). A negative result must be combined with clinical observations, patient history, and epidemiological information. The expected result is Negative.  Fact Sheet for Patients:  EntrepreneurPulse.com.au  Fact Sheet for Healthcare Providers:  IncredibleEmployment.be  This test is no t yet approved or cleared by the [place] FDA and  has been authorized for detection and/or diagnosis of SARS-CoV-2 by FDA under an Emergency Use Authorization (EUA). This EUA will remain  in effect (meaning this test can be used) for the duration of the COVID-19 declaration under Section 564(b)(1) of the Act, 21 U.S.C.section 360bbb-3(b)(1), unless the authorization is terminated  or revoked sooner.       Influenza A by PCR NEGATIVE NEGATIVE Final   Influenza B by PCR NEGATIVE NEGATIVE Final    Comment: (NOTE) The Xpert Xpress SARS-CoV-2/FLU/RSV plus assay is intended as an aid in the diagnosis of influenza from Nasopharyngeal swab specimens and should not be used as a sole basis for treatment. Nasal washings and aspirates are unacceptable for Xpert Xpress SARS-CoV-2/FLU/RSV testing.  Fact Sheet for Patients: EntrepreneurPulse.com.au  Fact Sheet for Healthcare Providers: IncredibleEmployment.be  This test is not yet approved or cleared by the [place] FDA and has been authorized for detection and/or diagnosis of SARS-CoV-2 by FDA under an Emergency Use Authorization (EUA). This EUA will remain in effect (meaning this test can be used) for the duration of the COVID-19 declaration under Section 564(b)(1) of the Act, 21 U.S.C. section 360bbb-3(b)(1), unless the authorization is terminated or revoked.  Performed at [facility], [place] [address], [place], [place] [address]   Blood Culture (routine x 2)     Status: Abnormal   Collection Time: 05/12/21  9:19 PM   Specimen: BLOOD  Result Value Ref [place] Status   Specimen Description BLOOD SITE NOT SPECIFIED  Final   Special Requests   Final    BOTTLES DRAWN AEROBIC AND ANAEROBIC Blood Culture adequate volume   Culture  Setup Time (A)  Final    GRAM VARIABLE ROD IN BOTH AEROBIC AND ANAEROBIC BOTTLES CRITICAL RESULT CALLED TO, READ BACK BY AND VERIFIED WITH: PHARMD E MARTIN [id number] AT 1447 BY CM Performed at [facility], [place] [address], [place], [place] [address]    Culture ESCHERICHIA COLI (A)  Final   Report Status 05/16/2021 FINAL  Final   Organism ID, Bacteria ESCHERICHIA COLI  Final      Susceptibility   Escherichia coli - MIC*    AMPICILLIN >=32 RESISTANT Resistant     CEFAZOLIN 8 SENSITIVE Sensitive     CEFEPIME <=0.12 SENSITIVE Sensitive     CEFTAZIDIME <=1 SENSITIVE Sensitive     CEFTRIAXONE 0.5 SENSITIVE Sensitive     CIPROFLOXACIN <=0.25 SENSITIVE Sensitive     GENTAMICIN <=1 SENSITIVE Sensitive     IMIPENEM <=0.25 SENSITIVE Sensitive     TRIMETH/SULFA <=20 SENSITIVE Sensitive     AMPICILLIN/SULBACTAM >=32 RESISTANT Resistant     PIP/TAZO 8 SENSITIVE Sensitive     * ESCHERICHIA COLI  Blood Culture ID Panel (Reflexed)     Status: Abnormal   Collection Time: 05/12/21  9:19 PM  Result Value Ref [place] Status   Enterococcus faecalis NOT DETECTED NOT DETECTED Final   Enterococcus Faecium NOT DETECTED NOT DETECTED Final   Listeria monocytogenes NOT DETECTED NOT DETECTED Final   Staphylococcus species NOT DETECTED NOT DETECTED Final   Staphylococcus aureus (BCID) NOT DETECTED NOT DETECTED Final   Staphylococcus epidermidis NOT DETECTED NOT DETECTED Final   Staphylococcus lugdunensis NOT DETECTED NOT DETECTED Final   Streptococcus species NOT DETECTED NOT DETECTED Final   Streptococcus agalactiae NOT DETECTED NOT DETECTED Final   Streptococcus pneumoniae  NOT DETECTED NOT DETECTED Final   Streptococcus pyogenes NOT DETECTED NOT DETECTED Final   A.calcoaceticus-baumannii NOT DETECTED NOT DETECTED Final   Bacteroides fragilis NOT DETECTED NOT DETECTED Final   Enterobacterales DETECTED (A) NOT DETECTED Final    Comment: Enterobacterales represent a large order of gram negative bacteria, not a single organism. CRITICAL RESULT CALLED TO, READ BACK BY AND VERIFIED WITH: PHARMD E MARTIN [id number] AR 1457 BY CM  Enterobacter cloacae complex NOT DETECTED NOT DETECTED Final   Escherichia coli DETECTED (A) NOT DETECTED Final    Comment: CRITICAL RESULT CALLED TO, READ BACK BY AND VERIFIED WITH: PHARMD E MARTIN [id number] AT 1457 BY CM    Klebsiella aerogenes NOT DETECTED NOT DETECTED Final   Klebsiella oxytoca NOT DETECTED NOT DETECTED Final   Klebsiella pneumoniae NOT DETECTED NOT DETECTED Final   Proteus species NOT DETECTED NOT DETECTED Final   Salmonella species NOT DETECTED NOT DETECTED Final   Serratia marcescens NOT DETECTED NOT DETECTED Final   Haemophilus influenzae NOT DETECTED NOT DETECTED Final   Neisseria meningitidis NOT DETECTED NOT DETECTED Final   Pseudomonas aeruginosa NOT DETECTED NOT DETECTED Final   Stenotrophomonas maltophilia NOT DETECTED NOT DETECTED Final   Candida albicans NOT DETECTED NOT DETECTED Final   Candida auris NOT DETECTED NOT DETECTED Final   Candida glabrata NOT DETECTED NOT DETECTED Final   Candida krusei NOT DETECTED NOT DETECTED Final   Candida parapsilosis NOT DETECTED NOT DETECTED Final   Candida tropicalis NOT DETECTED NOT DETECTED Final   Cryptococcus neoformans/gattii NOT DETECTED NOT DETECTED Final   CTX-M ESBL NOT DETECTED NOT DETECTED Final   Carbapenem resistance IMP NOT DETECTED NOT DETECTED Final   Carbapenem resistance KPC NOT DETECTED NOT DETECTED Final   Carbapenem resistance NDM NOT DETECTED NOT DETECTED Final   Carbapenem resist OXA 48 LIKE NOT DETECTED NOT DETECTED Final   Carbapenem  resistance VIM NOT DETECTED NOT DETECTED Final    Comment: Performed at [facility], [address], [place], [place] [address]  Blood Culture (routine x 2)     Status: Abnormal   Collection Time: 05/12/21  9:37 PM   Specimen: BLOOD  Result Value Ref [place] Status   Specimen Description BLOOD SITE NOT SPECIFIED  Final   Special Requests   Final    BOTTLES DRAWN AEROBIC AND ANAEROBIC Blood Culture adequate volume   Culture  Setup Time (A)  Final    GRAM VARIABLE ROD IN BOTH AEROBIC AND ANAEROBIC BOTTLES CRITICAL VALUE NOTED.  VALUE IS CONSISTENT WITH PREVIOUSLY REPORTED AND CALLED VALUE.    Culture (A)  Final    ESCHERICHIA COLI SUSCEPTIBILITIES PERFORMED ON PREVIOUS CULTURE WITHIN THE LAST 5 DAYS. Performed at [facility], [place] [address], [place], [place] [address]    Report Status 05/15/2021 FINAL  Final  Urine Culture     Status: None   Collection Time: 05/13/21  3:06 AM   Specimen: Urine, Clean Catch  Result Value Ref Range Status   Specimen Description URINE, CLEAN CATCH  Final   Special Requests NONE  Final   Culture   Final    NO GROWTH Performed at [facility], [place] [address], [place], [place] [address]    Report Status 05/14/2021 FINAL  Final         Radiology Studies: No results found.      Scheduled Meds:  aspirin EC  81 mg Oral Daily   atorvastatin  20 mg Oral Daily   donepezil  10 mg Oral QHS   [START ON 05/18/2021] enoxaparin (LOVENOX) injection  75 mg Subcutaneous Q12H   memantine  10 mg Oral BID   metoprolol tartrate  12.5 mg Oral BID   senna-docusate  2 tablet Oral BID   Continuous Infusions:  sodium chloride     sodium chloride     cefTRIAXone (ROCEPHIN)  IV Stopped (05/16/21 1900)   lactated ringers 75 mL/hr at  05/17/21 1146   metronidazole 500 mg (05/17/21 0820)     LOS: 4 days      Phillips Climes, MD [facility]   To contact the attending provider between 7A-7P or the covering provider during after  hours 7P-7A, please log into the web site [email or web address] and access using universal [place] password for that web site. If you do not have the password, please call the hospital operator.  05/17/2021, 3:09 PM

## 2021-05-17 NOTE — Progress Notes (Signed)
Charge nurse alerted by family member, Rafael Bihari, of missing cell phone. Incident report made.

## 2021-05-18 ENCOUNTER — Inpatient Hospital Stay (HOSPITAL_COMMUNITY): Payer: Medicare Other | Admitting: Certified Registered"

## 2021-05-18 ENCOUNTER — Encounter (HOSPITAL_COMMUNITY)
Admission: EM | Disposition: A | Discharge status: Home / Self Care | Payer: Self-pay | Source: Home / Self Care | Attending: Internal Medicine

## 2021-05-18 ENCOUNTER — Other Ambulatory Visit (HOSPITAL_COMMUNITY): Payer: Self-pay

## 2021-05-18 ENCOUNTER — Encounter (HOSPITAL_COMMUNITY): Payer: Self-pay | Admitting: Family Medicine

## 2021-05-18 HISTORY — PX: EUS: SHX5427

## 2021-05-18 HISTORY — PX: FINE NEEDLE ASPIRATION: SHX5430

## 2021-05-18 HISTORY — PX: ESOPHAGOGASTRODUODENOSCOPY (EGD) WITH PROPOFOL: SHX5813

## 2021-05-18 LAB — COMPREHENSIVE METABOLIC PANEL
ALT: 17 U/L (ref 0–44)
AST: 30 U/L (ref 15–41)
Albumin: 2 g/dL — ABNORMAL LOW (ref 3.5–5.0)
Alkaline Phosphatase: 88 U/L (ref 38–126)
Anion gap: 11 (ref 5–15)
BUN: 5 mg/dL — ABNORMAL LOW (ref 8–23)
CO2: 23 mmol/L (ref 22–32)
Calcium: 8 mg/dL — ABNORMAL LOW (ref 8.9–10.3)
Chloride: 97 mmol/L — ABNORMAL LOW (ref 98–111)
Creatinine, Ser: 0.73 mg/dL (ref 0.61–1.24)
GFR, Estimated: >60 mL/min (ref >60)
Glucose, Bld: 78 mg/dL (ref 70–99)
Potassium: 3.7 mmol/L (ref 3.5–5.1)
Sodium: 131 mmol/L — ABNORMAL LOW (ref 135–145)
Total Bilirubin: 1.4 mg/dL — ABNORMAL HIGH (ref 0.3–1.2)
Total Protein: 5.2 g/dL — ABNORMAL LOW (ref 6.5–8.1)

## 2021-05-18 LAB — CBC
HCT: 37.2 % — ABNORMAL LOW (ref 39.0–52.0)
Hemoglobin: 12.7 g/dL — ABNORMAL LOW (ref 13.0–17.0)
MCH: 29.5 pg (ref 26.0–34.0)
MCHC: 34.1 g/dL (ref 30.0–36.0)
MCV: 86.5 fL (ref 80.0–100.0)
Platelets: 190 10*3/uL (ref 150–400)
RBC: 4.3 MIL/uL (ref 4.22–5.81)
RDW: 15.2 % (ref 11.5–15.5)
WBC: 6.2 10*3/uL (ref 4.0–10.5)
nRBC: 0 % (ref 0.0–0.2)

## 2021-05-18 LAB — QUANTIFERON-TB GOLD PLUS: QuantiFERON-TB Gold Plus: NEGATIVE

## 2021-05-18 LAB — QUANTIFERON-TB GOLD PLUS (RQFGPL)
QuantiFERON Mitogen Value: 4.75 IU/mL
QuantiFERON Nil Value: 0.01 IU/mL
QuantiFERON TB1 Ag Value: 0 IU/mL
QuantiFERON TB2 Ag Value: 0 IU/mL

## 2021-05-18 SURGERY — ESOPHAGOGASTRODUODENOSCOPY (EGD) WITH PROPOFOL
Anesthesia: Monitor Anesthesia Care

## 2021-05-18 MED ORDER — PHENYLEPHRINE HCL-NACL 20-0.9 MG/250ML-% IV SOLN
INTRAVENOUS | Status: DC | PRN
  Administered 2021-05-18: 50 ug/min via INTRAVENOUS

## 2021-05-18 MED ORDER — PROPOFOL 10 MG/ML IV BOLUS
INTRAVENOUS | Status: DC | PRN
  Administered 2021-05-18: 15 mg via INTRAVENOUS
  Administered 2021-05-18: 35 mg via INTRAVENOUS

## 2021-05-18 MED ORDER — METRONIDAZOLE 500 MG/100ML IV SOLN
500.0000 mg | Freq: Two times a day (BID) | INTRAVENOUS | Status: DC
  Administered 2021-05-18 – 2021-05-21 (×6): 500 mg via INTRAVENOUS
  Filled 2021-05-18 (×7): qty 100

## 2021-05-18 MED ORDER — PHENYLEPHRINE 40 MCG/ML (10ML) SYRINGE FOR IV PUSH (FOR BLOOD PRESSURE SUPPORT)
PREFILLED_SYRINGE | INTRAVENOUS | Status: DC | PRN
  Administered 2021-05-18 (×3): 80 ug via INTRAVENOUS
  Administered 2021-05-18: 120 ug via INTRAVENOUS

## 2021-05-18 MED ORDER — PROPOFOL 500 MG/50ML IV EMUL
INTRAVENOUS | Status: DC | PRN
  Administered 2021-05-18: 75 ug/kg/min via INTRAVENOUS

## 2021-05-18 MED ORDER — SODIUM CHLORIDE 0.9 % IV SOLN: INTRAVENOUS | Status: DC | PRN

## 2021-05-18 MED ORDER — LIDOCAINE 2% (20 MG/ML) 5 ML SYRINGE
INTRAMUSCULAR | Status: DC | PRN
  Administered 2021-05-18: 60 mg via INTRAVENOUS

## 2021-05-18 SURGICAL SUPPLY — 15 items

## 2021-05-18 NOTE — Progress Notes (Signed)
Subjective: Has some abdominal pain after medication given to help move his bowels.  Otherwise no other acute issues  ROS: See above, otherwise other systems negative  Objective: Vital signs in last 24 hours: Temp:  [98.2 F (36.8 C)-99 F (37.2 C)] 98.2 F (36.8 C) (12/07 0721) Pulse Rate:  [70-93] 70 (12/07 0721) Resp:  [15-25] 18 (12/07 0721) BP: (117-135)/(63-82) 122/70 (12/07 0721) SpO2:  [93 %-95 %] 93 % (12/07 0721) Last BM Date: 05/16/21  Intake/Output from previous day: 12/06 0701 - 12/07 0700 In: 1509.6 [P.O.:120; I.V.:989.6; IV Piggyback:400] Out: 900 [Urine:900] Intake/Output this shift: No intake/output data recorded.  PE: Gen: non-ill appearing Heart: regular Lungs: CTAB Abd: soft, minimally tender diffusely, not focally in RUQ, +BS, ND  Lab Results:  Recent Labs    05/17/21 0111 05/18/21 0124  WBC 7.9 6.2  HGB 13.4 12.7*  HCT 39.4 37.2*  PLT 155 190   BMET Recent Labs    05/16/21 0050 05/18/21 0124  NA 130* 131*  K 4.0 3.7  CL 101 97*  CO2 23 23  GLUCOSE 90 78  BUN 5* 5*  CREATININE 0.69 0.73  CALCIUM 8.0* 8.0*   PT/INR No results for input(s): LABPROT, INR in the last 72 hours. CMP     Component Value Date/Time   NA 131 (L) 05/18/2021 0124   K 3.7 05/18/2021 0124   CL 97 (L) 05/18/2021 0124   CO2 23 05/18/2021 0124   GLUCOSE 78 05/18/2021 0124   BUN 5 (L) 05/18/2021 0124   CREATININE 0.73 05/18/2021 0124   CREATININE 0.81 11/03/2015 0918   CALCIUM 8.0 (L) 05/18/2021 0124   PROT 5.2 (L) 05/18/2021 0124   PROT 7.5 12/17/2019 0831   ALBUMIN 2.0 (L) 05/18/2021 0124   AST 30 05/18/2021 0124   ALT 17 05/18/2021 0124   ALKPHOS 88 05/18/2021 0124   BILITOT 1.4 (H) 05/18/2021 0124   GFRNONAA >60 05/18/2021 0124   GFRNONAA 88 11/03/2015 0918   GFRAA >60 02/29/2020 0054   GFRAA >89 11/03/2015 0918   Lipase     Component Value Date/Time   LIPASE 28 04/03/2021 2011       Studies/Results: No results  found.  Anti-infectives: Anti-infectives (From admission, onward)    Start     Dose/Rate Route Frequency Ordered Stop   05/18/21 2000  metroNIDAZOLE (FLAGYL) IVPB 500 mg        500 mg 100 mL/hr over 60 Minutes Intravenous Every 12 hours 05/18/21 0837     05/14/21 1800  cefTRIAXone (ROCEPHIN) 2 g in sodium chloride 0.9 % 100 mL IVPB        2 g 200 mL/hr over 30 Minutes Intravenous Every 24 hours 05/14/21 1429     05/14/21 1600  metroNIDAZOLE (FLAGYL) IVPB 500 mg  Status:  Discontinued        500 mg 100 mL/hr over 60 Minutes Intravenous Every 8 hours 05/14/21 1429 05/18/21 0837   05/13/21 0600  piperacillin-tazobactam (ZOSYN) IVPB 3.375 g  Status:  Discontinued        3.375 g 12.5 mL/hr over 240 Minutes Intravenous Every 8 hours 05/13/21 0305 05/14/21 1429   05/12/21 2130  piperacillin-tazobactam (ZOSYN) IVPB 3.375 g        3.375 g 100 mL/hr over 30 Minutes Intravenous  Once 05/12/21 2124 05/12/21 2234   05/12/21 2115  ceFEPIme (MAXIPIME) 2 g in sodium chloride 0.9 % 100 mL IVPB  Status:  Discontinued  2 g 200 mL/hr over 30 Minutes Intravenous  Once 05/12/21 2114 05/12/21 2124   05/12/21 2115  metroNIDAZOLE (FLAGYL) IVPB 500 mg  Status:  Discontinued        500 mg 100 mL/hr over 60 Minutes Intravenous  Once 05/12/21 2114 05/12/21 2124   05/12/21 2115  vancomycin (VANCOREADY) IVPB 1500 mg/300 mL  Status:  Discontinued        1,500 mg 150 mL/hr over 120 Minutes Intravenous  Once 05/12/21 2114 05/12/21 2124        Assessment/Plan Gallbladder wall thickening, E coli Bacteremia/upper abdominal adenopathy -await EUS today and FNA of lymph nodes to determine if he has a malignant process or not.  If unable to get diagnostic samples, could discuss with IR regarding attempt to biopsy area in the liver -hep A positive but appears old as only antibody positive -no evidence at this point of acute cholecystitis.  -no plans for surgical intervention.  More work up to further delineate  this process is needed. -will follow, but likely chart check awaiting path results from EUS.    FEN - NPO for EDU, IVFs VTE - lovenox 75mg  ID - Rocephin/Flagyl  Renal mass - per urology Small PE - on therapeutic lovenox  LOS: 5 days    Andre [name] , [facility] 05/18/2021, 9:44 AM Please see Amion for pager number during day hours 7:00am-4:30pm or 7:00am -11:30am on weekends

## 2021-05-18 NOTE — Anesthesia Postprocedure Evaluation (Deleted)
Anesthesia Post Note  Patient: Mervin Ramires  Procedure(s) Performed: ESOPHAGOGASTRODUODENOSCOPY (EGD) WITH PROPOFOL UPPER ENDOSCOPIC ULTRASOUND (EUS) LINEAR (Left) FINE NEEDLE ASPIRATION (FNA) LINEAR     Patient location during evaluation: Endoscopy Anesthesia Type: MAC Level of consciousness: awake Pain management: pain level controlled Vital Signs Assessment: post-procedure vital signs reviewed and stable Respiratory status: spontaneous breathing Cardiovascular status: stable Postop Assessment: no apparent nausea or vomiting Anesthetic complications: no   No notable events documented.  Last Vitals:  Vitals:   05/18/21 0721 05/18/21 1141  BP: 122/70 118/66  Pulse: 70 71  Resp: 18 18  Temp: 36.8 C 36.7 C  SpO2: 93% 97%    Last Pain:  Vitals:   05/18/21 1141  TempSrc: Temporal  PainSc: 0-No pain                 Jacorey Donaway

## 2021-05-18 NOTE — Transfer of Care (Signed)
Immediate Anesthesia Transfer of Care Note  Patient: Andre [name]  Procedure(s) Performed: ESOPHAGOGASTRODUODENOSCOPY (EGD) WITH PROPOFOL UPPER ENDOSCOPIC ULTRASOUND (EUS) LINEAR (Left) FINE NEEDLE ASPIRATION (FNA) LINEAR  Patient Location: PACU  Anesthesia Type:MAC  Level of Consciousness: drowsy and patient cooperative  Airway & Oxygen Therapy: Patient Spontanous Breathing  Post-op Assessment: Report given to RN  Post vital signs: Reviewed and stable  Last Vitals:  Vitals Value Taken Time  BP 104/52 05/18/21 1402  Temp    Pulse 65 05/18/21 1404  Resp 18 05/18/21 1404  SpO2 100 % 05/18/21 1404  Vitals shown include unvalidated device data.  Last Pain:  Vitals:   05/18/21 1141  TempSrc: Temporal  PainSc: 0-No pain      Patients Stated Pain Goal: 3 (73/42/87 6811)  Complications: No notable events documented.

## 2021-05-18 NOTE — Anesthesia Preprocedure Evaluation (Signed)
Anesthesia Evaluation  Patient identified by MRN, date of birth, ID band Patient awake    Reviewed: Allergy & Precautions  Airway Mallampati: II  TM Distance: >3 FB     Dental   Pulmonary Current Smoker and Patient abstained from smoking.,    breath sounds clear to auscultation       Cardiovascular hypertension, + CAD   Rhythm:Regular Rate:Normal     Neuro/Psych    GI/Hepatic Neg liver ROS, GERD  ,  Endo/Other  diabetes  Renal/GU negative Renal ROS     Musculoskeletal   Abdominal   Peds  Hematology   Anesthesia Other Findings   Reproductive/Obstetrics                             Anesthesia Physical Anesthesia Plan  ASA: 3  Anesthesia Plan: MAC   Post-op Pain Management:    Induction: Intravenous  PONV Risk Score and Plan: Propofol infusion and Treatment may vary due to age or medical condition  Airway Management Planned:   Additional Equipment:   Intra-op Plan:   Post-operative Plan:   Informed Consent: I have reviewed the patients History and Physical, chart, labs and discussed the procedure including the risks, benefits and alternatives for the proposed anesthesia with the patient or authorized representative who has indicated his/her understanding and acceptance.     Dental advisory given  Plan Discussed with: Anesthesiologist and CRNA  Anesthesia Plan Comments:         Anesthesia Quick Evaluation

## 2021-05-18 NOTE — Progress Notes (Signed)
Physical Therapy Treatment Patient Details Name: Andre [name] MRN: [id number] DOB: Jan 26, 1941 Today's Date: 05/18/2021   History of Present Illness pt is a 80 y/o male admitted with c/o rigors and abdominal pain.  Work up for sepsis due to cholecystities/E. coli bacteremia. Small PE found.  PMHx:  GERD, CAD, HTN, rectal CA, TIA    PT Comments    Pt tolerates treatment well, ambulating for increased distances. Pt does continue to demonstrate generalized weakness and instability, benefiting from UE support of IV pole. Pt will benefit from frequent mobilization and is encouraged to ambulate out of the room with staff assistance at least 3 times daily.   Recommendations for follow up therapy are one component of a multi-disciplinary discharge planning process, led by the attending physician.  Recommendations may be updated based on patient status, additional functional criteria and insurance authorization.  Follow Up Recommendations  No PT follow up     Assistance Recommended at Discharge Intermittent Supervision/Assistance  Equipment Recommendations  None recommended by PT    Recommendations for Other Services       Precautions / Restrictions Precautions Precautions: Fall Restrictions Weight Bearing Restrictions: No     Mobility  Bed Mobility Overal bed mobility: Modified Independent                  Transfers Overall transfer level: Needs assistance Equipment used: None Transfers: Sit to/from Stand Sit to Stand: Min guard                Ambulation/Gait Ambulation/Gait assistance: Min guard Gait Distance (Feet): 300 Feet Assistive device: IV Pole Gait Pattern/deviations: Step-through pattern Gait velocity: reduced Gait velocity interpretation: <1.8 ft/sec, indicate of risk for recurrent falls   General Gait Details: pt with 2 lateral losses of balance, recovering with stepping strategy. Balance improves with increased ambulation distance and UE support of  IV pole   Stairs             Wheelchair Mobility    Modified Rankin (Stroke Patients Only)       Balance Overall balance assessment: Needs assistance Sitting-balance support: No upper extremity supported;Feet supported Sitting balance-Leahy Scale: Good     Standing balance support: No upper extremity supported Standing balance-Leahy Scale: Fair                              Cognition Arousal/Alertness: Awake/alert Behavior During Therapy: WFL for tasks assessed/performed Overall Cognitive Status: Within Functional Limits for tasks assessed                                          Exercises      General Comments General comments (skin integrity, edema, etc.): VSS on RA      Pertinent Vitals/Pain Pain Assessment: Faces Faces Pain Scale: Hurts even more Pain Location: abdomen Pain Descriptors / Indicators: Grimacing Pain Intervention(s): Monitored during session    Home Living                          Prior Function            PT Goals (current goals can now be found in the care plan section) Acute Rehab PT Goals Patient Stated Goal: home after the problem is okay Progress towards PT goals: Progressing toward goals    Frequency  Min 3X/week      PT Plan Current plan remains appropriate    Co-evaluation              AM-PAC PT "6 Clicks" Mobility   Outcome Measure  Help needed turning from your back to your side while in a flat bed without using bedrails?: None Help needed moving from lying on your back to sitting on the side of a flat bed without using bedrails?: None Help needed moving to and from a bed to a chair (including a wheelchair)?: A Little Help needed standing up from a chair using your arms (e.g., wheelchair or bedside chair)?: A Little Help needed to walk in hospital room?: A Little Help needed climbing 3-5 steps with a railing? : A Little 6 Click Score: 20    End of Session    Activity Tolerance: Patient tolerated treatment well Patient left: in chair;with call bell/phone within reach;with chair alarm set Nurse Communication: Mobility status PT Visit Diagnosis: Unsteadiness on feet (R26.81);Muscle weakness (generalized) (M62.81);Difficulty in walking, not elsewhere classified (R26.2)     Time: 8185-6314 PT Time Calculation (min) (ACUTE ONLY): 28 min  Charges:  $Gait Training: 23-37 mins                     Zenaida Niece, PT, DPT Acute Rehabilitation Pager: [phone] Office [place] 05/18/2021, 12:42 PM

## 2021-05-18 NOTE — Anesthesia Procedure Notes (Signed)
Procedure Name: MAC Date/Time: 05/18/2021 12:45 PM Performed by: Georgia Duff, CRNA Pre-anesthesia Checklist: Patient identified, Emergency Drugs available, Suction available and Patient being monitored Patient Re-evaluated:Patient Re-evaluated prior to induction Oxygen Delivery Method: Simple face mask Preoxygenation: Pre-oxygenation with 100% oxygen Induction Type: IV induction Airway Equipment and Method: Bite block Placement Confirmation: positive ETCO2 and CO2 detector Dental Injury: Teeth and Oropharynx as per pre-operative assessment

## 2021-05-18 NOTE — TOC Initial Note (Signed)
Transition of Care [facility]) - Initial/Assessment Note    Patient Details  Name: Andre [name] MRN: [id number] Date of Birth: [birth year]  [facility] CM/SW Contact:    Carles Collet, RN Phone Number: 05/18/2021, 12:16 PM  Clinical Narrative:                Damaris Schooner w patient's daughter over the phone. She states that the patient is from home with roommates, with plans to return home. She voiced concern that a roommate smokes (patient is currently RA) and that the patient is not able to deep clean the house. She specifically states that she feels it IS safe for him return, but requested information on low income senior house for reference and this has been emailed to her. She states that her and her sister will be deep cleaning the home before the patient returns. She states that was independent prior to admission, and she doesn't let him drive anymore but thinks that he probably does sometimes anyway.  She would like [facility] services, PT. She does not a preference for provider.  Patient will [facility] services set up and [facility] [place] orders.    Expected Discharge Plan: [place] Barriers to Discharge: Continued Medical Work up   Patient Goals and CMS Choice Patient states their goals for this hospitalization and ongoing recovery are:: to go home CMS Medicare.gov Compare Post Acute Care list provided to:: Other (Comment Required) Choice offered to / list presented to : Adult Children  Expected Discharge Plan and Services Expected Discharge Plan: [place]   Discharge Planning Services: CM Consult Post Acute Care Choice: Home Health Living arrangements for the past 2 months: Single Family Home                                      Prior Living Arrangements/Services Living arrangements for the past 2 months: Single Family Home Lives with:: Roommate                   Activities of Daily Living      Permission Sought/Granted                   Emotional Assessment              Admission diagnosis:  RUQ pain [R10.11] Fever [R50.9] Abdominal pain [R10.9] Sepsis ([place]) [A41.9] Sepsis with acute organ dysfunction without septic shock, due to unspecified organism, unspecified type ([place]) [A41.9, R65.20] Acute pulmonary embolism, unspecified pulmonary embolism type, unspecified whether acute cor pulmonale present ([place]) [I26.99] Patient Active Problem List   Diagnosis Date Noted   Sepsis ([place]) 05/13/2021   Acute cholecystitis 05/13/2021   Pulmonary embolism ([place]) 05/13/2021   RUQ pain 04/06/2021   Kidney mass 04/06/2021   Epigastric pain 03/28/2021   Pain due to onychomycosis of toenails of both feet 01/14/2021   Blurry vision, bilateral 12/31/2020   Nail disorder 12/31/2020   Left shoulder pain 02/11/2020   Right shoulder pain 02/11/2020   Vitamin B12 deficiency 12/17/2019   Mild cognitive impairment 12/17/2019   Peripheral neuropathy 12/17/2019   Vitamin D deficiency 12/01/2019   Patellar tendonitis of left knee 12/01/2019   Allergic rhinitis 10/07/2019   Dementia ([place]) 10/07/2019   CAD (coronary artery disease) 09/30/2019   Amnesia 09/10/2019   Right-sided nosebleed 08/07/2019   Encounter for well adult exam with abnormal findings 08/07/2019  Leg cramping 08/07/2019   Chest pain 03/23/2019   Epistaxis 03/23/2019   Hyperlipidemia 12/24/2017   Alcohol use 12/24/2017   Non-compliance 12/24/2017   Degenerative disc disease, lumbar 11/15/2017   Left lumbar radiculopathy 08/02/2017   Atherosclerosis 06/03/2017   Low-level of literacy 05/12/2017   Osteoarthritis 04/20/2017   Diabetes ([place]) 03/12/2017   Back pain 02/29/2016   Abnormal stress test 10/18/2015   Erectile dysfunction 02/25/2015   Cigarette smoker 02/10/2015   COPD GOLD 0 11/08/2014   Hemorrhoids 02/11/2014   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

## 2021-05-18 NOTE — Plan of Care (Signed)
  Problem: Clinical Measurements: Goal: Respiratory complications will improve Outcome: Progressing Goal: Cardiovascular complication will be avoided Outcome: Progressing   

## 2021-05-18 NOTE — Interval H&P Note (Signed)
History and Physical Interval Note:  05/18/2021 12:02 PM  Andre [name]  has presented today for surgery, with the diagnosis of Abnormal CT scan.  The various methods of treatment have been discussed with the patient and family. After consideration of risks, benefits and other options for treatment, the patient has consented to  Procedure(s): ESOPHAGOGASTRODUODENOSCOPY (EGD) WITH PROPOFOL (N/A) UPPER ENDOSCOPIC ULTRASOUND (EUS) LINEAR (Left) as a surgical intervention.  The patient's history has been reviewed, patient examined, no change in status, stable for surgery.  I have reviewed the patient's chart and labs.  Questions were answered to the patient's satisfaction.     Jaqwon Manfred D

## 2021-05-18 NOTE — Progress Notes (Signed)
[facility] for Infectious Disease  Date of Admission:  05/12/2021   Total days of inpatient antibiotics 7  Principal Problem:   Sepsis ([place]) Active Problems:   Essential hypertension, benign   GERD (gastroesophageal reflux disease)   Hyperlipidemia   Acute cholecystitis   Pulmonary embolism [facility])          Assessment: 80 year old male with rectal cancer status post excision about 10 years ago, CAD, TIA admitted for sepsis secondary to E. coli bacteremia. PT had rigors and diarrhea, abdominal pain leading up to admission.    #E coli bacteremia 2/2 cholecystitis/intraabdominal infection #Concern for malignancy, renal mass, necrotic lymphadenopathy, Hx of rectal cancer -CT chest showed small subsegmental PE, 1.1 cm non calcified lung nodule LUL -CT abdomen pelvis showed concern for cholecystitis, necrotic abdominal lymphadenopathy, renal mass suspicious for renal cancer -MRCP showed chronic cholecystitis with cholelithiasis, necrotic adenopathy, renal mass consistent with renal cancer. General surgery consulted and no plans for intervention as do not think pt has cholecystis. .  Recommend GI engagement for EUS and LN bx.  -During the 04/06/21 visit with Dr. Jeneen Rinks Jones(PCP) pt was noted to have 20 lb wight loss in 2 months. He had followed up with urology who had recommend PET scan. Given his Hs of rectal Cx, weight loss(pt reprots he ate less due to abdominal pain) and renal lesion suspect the necrotic lymph nodes are due malignancy. Of note he has a history of epigastric pain for the past few months.  -Would still cover for intraabdominal infection as liver lesion(3x2.6cm) on MRCP is new since 04/03/21 imaging.  -Hep A immune, IGRA, RPR. Hep B naturally immune, HIV negative -GI consulted, plan on EUS today  Recommendations:  -Continue ceftriaxone and metronidazole for intraabdominal infection. Since liver lesion(since imaging on 04/03/21) is new and chronic choly seen on  imaging would suspect a likely infectious etiology. I reviewed imaging with Dr. Jobe Igo radiology. Although malignancy is in the differential.  - EUS and LN Bx with path and Cx(Bacterial/afb/fungal).    Microbiology:   Antibiotics: Pip-tazo 12/1-12/3 Ceftriaxone 12/3-p Metronidazole 12/3-p   Cultures: Blood 12/1 2/2 Ecoli Urine 12/1 no growth  SUBJECTIVE: PT is resting in bed. Daughter is at bedside. I discussed pt's findings with the daughter. She is unsure if pt had rectal cancer, but reports her older sister is more aware of pt's medical history. He is going for endoscopy today.   Review of Systems: Review of Systems  All other systems reviewed and are negative.   Scheduled Meds:  [MAR Hold] aspirin EC  81 mg Oral Daily   [MAR Hold] atorvastatin  20 mg Oral Daily   [MAR Hold] donepezil  10 mg Oral QHS   [MAR Hold] enoxaparin (LOVENOX) injection  75 mg Subcutaneous Q12H   [MAR Hold] memantine  10 mg Oral BID   [MAR Hold] metoprolol tartrate  12.5 mg Oral BID   [MAR Hold] senna-docusate  2 tablet Oral BID   Continuous Infusions:  sodium chloride     [MAR Hold] sodium chloride     [MAR Hold] cefTRIAXone (ROCEPHIN)  IV Stopped (05/17/21 1848)   lactated ringers 75 mL/hr at 05/18/21 0422   [MAR Hold] metronidazole     PRN Meds:.[MAR Hold] sodium chloride, [MAR Hold] acetaminophen **OR** [DISCONTINUED] acetaminophen, [MAR Hold] ondansetron **OR** [MAR Hold] ondansetron (ZOFRAN) IV, [MAR Hold] oxyCODONE, [MAR Hold] tiZANidine No Known Allergies  OBJECTIVE: Vitals:   05/17/21 2339 05/18/21 0304 05/18/21 0721 05/18/21 1141  BP: 135/73 120/70 122/70 118/66  Pulse: 93 71 70 71  Resp: 15 18 18 18   Temp: 98.5 F (36.9 C) 98.2 F (36.8 C) 98.2 F (36.8 C) 98.1 F (36.7 C)  TempSrc: Axillary Axillary Axillary Temporal  SpO2: 95% 95% 93% 97%  Weight:      Height:       Body mass index is 23.16 kg/m.  Physical Exam Constitutional:      General: He is not in acute  distress.    Appearance: He is normal weight. He is not toxic-appearing.  HENT:     Head: Normocephalic and atraumatic.     Right Ear: External ear normal.     Left Ear: External ear normal.     Nose: No congestion or rhinorrhea.     Mouth/Throat:     Mouth: Mucous membranes are moist.     Pharynx: Oropharynx is clear.  Eyes:     Extraocular Movements: Extraocular movements intact.     Conjunctiva/sclera: Conjunctivae normal.     Pupils: Pupils are equal, round, and reactive to light.  Cardiovascular:     Rate and Rhythm: Normal rate and regular rhythm.     Heart sounds: No murmur heard.   No friction rub. No gallop.  Pulmonary:     Effort: Pulmonary effort is normal.     Breath sounds: Normal breath sounds.  Abdominal:     General: Abdomen is flat. Bowel sounds are normal.     Palpations: Abdomen is soft.  Musculoskeletal:        General: No swelling. Normal range of motion.     Cervical back: Normal range of motion and neck supple.  Skin:    General: Skin is warm and dry.  Neurological:     General: No focal deficit present.     Mental Status: He is oriented to person, place, and time.  Psychiatric:        Mood and Affect: Mood normal.      Lab Results Lab Results  Component Value Date   WBC 6.2 05/18/2021   HGB 12.7 (L) 05/18/2021   HCT 37.2 (L) 05/18/2021   MCV 86.5 05/18/2021   PLT 190 05/18/2021    Lab Results  Component Value Date   CREATININE 0.73 05/18/2021   BUN 5 (L) 05/18/2021   NA 131 (L) 05/18/2021   K 3.7 05/18/2021   CL 97 (L) 05/18/2021   CO2 23 05/18/2021    Lab Results  Component Value Date   ALT 17 05/18/2021   AST 30 05/18/2021   ALKPHOS 88 05/18/2021   BILITOT 1.4 (H) 05/18/2021        Laurice Record, MD [facility] 05/18/2021, 12:19 PM

## 2021-05-18 NOTE — Progress Notes (Addendum)
PROGRESS NOTE        PATIENT DETAILS Name: Andre [name] Age: 80 y.o. Sex: male Date of Birth: 13-Nov-1940 Admit Date: 05/12/2021 Admitting Physician Rolla Plate, DO WNI:OEVO, Hunt Oris, MD  Brief Narrative: Patient is a 80 y.o. male with history of rectal adenocarcinoma-s/p transanal excisional biopsy, CAD, GERD, HTN, TIA who presented with fever, abdominal pain-found to have sepsis secondary to E. coli bacteremia with concern that he may have underlying malignancy.  See below for further details.  Subjective: Lying comfortably in bed-denies any chest pain or shortness of breath.  Continues to have some diffuse abdominal pain.  Objective: Vitals: Blood pressure 118/66, pulse 71, temperature 98.1 F (36.7 C), temperature source Temporal, resp. rate 18, height 5\' 10"  (1.778 m), weight 73.2 kg, SpO2 97 %.   Exam: Gen Exam:Alert awake-not in any distress HEENT:atraumatic, normocephalic Chest: B/L clear to auscultation anteriorly CVS:S1S2 regular Abdomen: Soft but minimally diffusely tender. Extremities:no edema Neurology: Non focal Skin: no rash  Pertinent Labs/Radiology: Recent Labs  Lab 05/12/21 1948 05/13/21 0508 05/13/21 0632 05/14/21 0632 05/15/21 0107 05/18/21 0124  WBC 8.9 10.5  --  8.6   < > 6.2  HGB 16.7 14.1  --  13.5   < > 12.7*  PLT 225 171  --  150   < > 190  NA 136 131*  --  131*   < > 131*  K 4.3 5.2*   < > 3.7   < > 3.7  CREATININE 0.94 0.78  --  0.88   < > 0.73  AST 26 44*  --  41  --  30  ALT 20 22  --  21  --  17  ALKPHOS 116 94  --  74  --  88  BILITOT 3.5* 3.5*  --  2.2*  --  1.4*   < > = values in this interval not displayed.      Assessment/Plan: Sepsis secondary to E. coli bacteremia: Sepsis physiology has resolved-remains on IV Rocephin/Flagyl-ID following.  Pulmonary embolism: Resume Lovenox-after EUS has been completed.  Lower extremity Doppler negative for DVT.  Upper abdominal adenopathy/mild  intrahepatic and extrahepatic biliary ductal dilatation: For EUS today-suspicion for malignancy given adenopathy/ductal dilatation/PE and unintentional 30 pound weight loss.    Cholelithiasis: Asymptomatic-per general surgery-overall clinical picture not consistent with cholecystitis-no plans for cholecystectomy or cholecystostomy drain.  Right renal lesion: Concerning for malignancy-outpatient follow-up with Dr. Abner Greenspan.  1.1 cm noncalcified left upper lobe lung nodule: We will need outpatient follow-up with repeat CT chest in 3 to 6 months.  If no evidence of biliary malignancy-May need work-up sooner  HTN: BP stable-continue metoprolol.  HFrEF: Euvolemic on exam  HLD: Continue statin  Dementia: Appears to be mild-continue Aricept/Namenda  BMI Estimated body mass index is 23.16 kg/m as calculated from the following:   Height as of this encounter: 5\' 10"  (1.778 m).   Weight as of this encounter: 73.2 kg.   Procedures: None Consults: GI, general surgery DVT Prophylaxis: Lovenox on hold Code Status:Full code  Family Communication: Daughter-Latoya-[phone]-updated over the phone on 12/7  Time spent: 35 minutes-Greater than 50% of this time was spent in counseling, explanation of diagnosis, planning of further management, and coordination of care.   Disposition Plan: Status is: Inpatient  Remains inpatient appropriate because: Gram-negative bacteremia-concern for intra-abdominal malignancy-for EUS today.  Diet: Diet Order             Diet NPO time specified  Diet effective midnight                     Antimicrobial agents: Anti-infectives (From admission, onward)    Start     Dose/Rate Route Frequency Ordered Stop   05/18/21 2000  [MAR Hold]  metroNIDAZOLE (FLAGYL) IVPB 500 mg        (MAR Hold since Wed 05/18/2021 at 1140.Hold Reason: Transfer to a Procedural area)   500 mg 100 mL/hr over 60 Minutes Intravenous Every 12 hours 05/18/21 0837     05/14/21 1800   [MAR Hold]  cefTRIAXone (ROCEPHIN) 2 g in sodium chloride 0.9 % 100 mL IVPB        (MAR Hold since Wed 05/18/2021 at 1140.Hold Reason: Transfer to a Procedural area)   2 g 200 mL/hr over 30 Minutes Intravenous Every 24 hours 05/14/21 1429     05/14/21 1600  metroNIDAZOLE (FLAGYL) IVPB 500 mg  Status:  Discontinued        500 mg 100 mL/hr over 60 Minutes Intravenous Every 8 hours 05/14/21 1429 05/18/21 0837   05/13/21 0600  piperacillin-tazobactam (ZOSYN) IVPB 3.375 g  Status:  Discontinued        3.375 g 12.5 mL/hr over 240 Minutes Intravenous Every 8 hours 05/13/21 0305 05/14/21 1429   05/12/21 2130  piperacillin-tazobactam (ZOSYN) IVPB 3.375 g        3.375 g 100 mL/hr over 30 Minutes Intravenous  Once 05/12/21 2124 05/12/21 2234   05/12/21 2115  ceFEPIme (MAXIPIME) 2 g in sodium chloride 0.9 % 100 mL IVPB  Status:  Discontinued        2 g 200 mL/hr over 30 Minutes Intravenous  Once 05/12/21 2114 05/12/21 2124   05/12/21 2115  metroNIDAZOLE (FLAGYL) IVPB 500 mg  Status:  Discontinued        500 mg 100 mL/hr over 60 Minutes Intravenous  Once 05/12/21 2114 05/12/21 2124   05/12/21 2115  vancomycin (VANCOREADY) IVPB 1500 mg/300 mL  Status:  Discontinued        1,500 mg 150 mL/hr over 120 Minutes Intravenous  Once 05/12/21 2114 05/12/21 2124        MEDICATIONS: Scheduled Meds:  [MAR Hold] aspirin EC  81 mg Oral Daily   [MAR Hold] atorvastatin  20 mg Oral Daily   [MAR Hold] donepezil  10 mg Oral QHS   [MAR Hold] enoxaparin (LOVENOX) injection  75 mg Subcutaneous Q12H   [MAR Hold] memantine  10 mg Oral BID   [MAR Hold] metoprolol tartrate  12.5 mg Oral BID   [MAR Hold] senna-docusate  2 tablet Oral BID   Continuous Infusions:  sodium chloride     [MAR Hold] sodium chloride     [MAR Hold] cefTRIAXone (ROCEPHIN)  IV Stopped (05/17/21 1848)   lactated ringers 75 mL/hr at 05/18/21 0422   [MAR Hold] metronidazole     PRN Meds:.[MAR Hold] sodium chloride, [MAR Hold] acetaminophen  **OR** [DISCONTINUED] acetaminophen, [MAR Hold] ondansetron **OR** [MAR Hold] ondansetron (ZOFRAN) IV, [MAR Hold] oxyCODONE, [MAR Hold] tiZANidine   I have personally reviewed following labs and imaging studies  LABORATORY DATA: CBC: Recent Labs  Lab 05/12/21 1948 05/13/21 0508 05/14/21 0632 05/15/21 0107 05/16/21 0050 05/17/21 0111 05/18/21 0124  WBC 8.9 10.5 8.6 8.0 6.8 7.9 6.2  NEUTROABS 7.6 9.6*  --   --   --   --   --  HGB 16.7 14.1 13.5 12.8* 13.1 13.4 12.7*  HCT 51.5 42.5 40.1 38.8* 38.4* 39.4 37.2*  MCV 92.0 89.9 88.5 89.2 87.9 87.4 86.5  PLT 225 171 150 146* 149* 155 093    Basic Metabolic Panel: Recent Labs  Lab 05/13/21 0508 05/13/21 0632 05/14/21 0632 05/15/21 0107 05/16/21 0050 05/18/21 0124  NA 131*  --  131* 131* 130* 131*  K 5.2* 3.6 3.7 3.1* 4.0 3.7  CL 97*  --  100 98 101 97*  CO2 27  --  23 25 23 23   GLUCOSE 96  --  96 86 90 78  BUN 10  --  7* 7* 5* 5*  CREATININE 0.78  --  0.88 0.83 0.69 0.73  CALCIUM 8.7*  --  8.6* 8.1* 8.0* 8.0*  MG 1.7  --   --   --   --   --     GFR: Estimated Creatinine Clearance: 77.3 mL/min (by C-G formula based on SCr of 0.73 mg/dL).  Liver Function Tests: Recent Labs  Lab 05/12/21 1948 05/13/21 0508 05/14/21 0632 05/18/21 0124  AST 26 44* 41 30  ALT 20 22 21 17   ALKPHOS 116 94 74 88  BILITOT 3.5* 3.5* 2.2* 1.4*  PROT 8.1 6.1* 5.6* 5.2*  ALBUMIN 4.1 3.0* 2.6* 2.0*   No results for input(s): LIPASE, AMYLASE in the last 168 hours. No results for input(s): AMMONIA in the last 168 hours.  Coagulation Profile: Recent Labs  Lab 05/12/21 2137 05/13/21 0508  INR 1.1 1.3*    Cardiac Enzymes: No results for input(s): CKTOTAL, CKMB, CKMBINDEX, TROPONINI in the last 168 hours.  BNP (last 3 results) No results for input(s): PROBNP in the last 8760 hours.  Lipid Profile: No results for input(s): CHOL, HDL, LDLCALC, TRIG, CHOLHDL, LDLDIRECT in the last 72 hours.  Thyroid Function Tests: No results for  input(s): TSH, T4TOTAL, FREET4, T3FREE, THYROIDAB in the last 72 hours.  Anemia Panel: No results for input(s): VITAMINB12, FOLATE, FERRITIN, TIBC, IRON, RETICCTPCT in the last 72 hours.  Urine analysis:    Component Value Date/Time   COLORURINE YELLOW 05/12/2021 1927   APPEARANCEUR CLEAR 05/12/2021 1927   LABSPEC 1.020 05/12/2021 1927   PHURINE 6.0 05/12/2021 1927   GLUCOSEU NEGATIVE 05/12/2021 1927   GLUCOSEU NEGATIVE 03/28/2021 1018   HGBUR NEGATIVE 05/12/2021 1927   BILIRUBINUR SMALL (A) 05/12/2021 1927   BILIRUBINUR neg 05/10/2017 0909   KETONESUR 15 (A) 05/12/2021 1927   PROTEINUR NEGATIVE 05/12/2021 1927   UROBILINOGEN 0.2 03/28/2021 1018   NITRITE NEGATIVE 05/12/2021 1927   LEUKOCYTESUR NEGATIVE 05/12/2021 1927    Sepsis Labs: Lactic Acid, Venous    Component Value Date/Time   LATICACIDVEN 1.2 05/13/2021 0750    MICROBIOLOGY: Recent Results (from the past 240 hour(s))  Resp Panel by RT-PCR (Flu A&B, Covid) Nasopharyngeal Swab     Status: None   Collection Time: 05/12/21  9:14 PM   Specimen: Nasopharyngeal Swab; Nasopharyngeal(NP) swabs in vial transport medium  Result Value Ref Range Status   SARS Coronavirus 2 by RT PCR NEGATIVE NEGATIVE Final    Comment: (NOTE) SARS-CoV-2 target nucleic acids are NOT DETECTED.  The SARS-CoV-2 RNA is generally detectable in upper respiratory specimens during the acute phase of infection. The lowest concentration of SARS-CoV-2 viral copies this assay can detect is 138 copies/mL. A negative result does not preclude SARS-Cov-2 infection and should not be used as the sole basis for treatment or other patient management decisions. A negative result may occur with  improper specimen collection/handling, submission of specimen other than nasopharyngeal swab, presence of viral mutation(s) within the areas targeted by this assay, and inadequate number of viral copies(<138 copies/mL). A negative result must be combined with clinical  observations, patient history, and epidemiological information. The expected result is Negative.  Fact Sheet for Patients:  EntrepreneurPulse.com.au  Fact Sheet for Healthcare Providers:  IncredibleEmployment.be  This test is no t yet approved or cleared by the [place] FDA and  has been authorized for detection and/or diagnosis of SARS-CoV-2 by FDA under an Emergency Use Authorization (EUA). This EUA will remain  in effect (meaning this test can be used) for the duration of the COVID-19 declaration under Section 564(b)(1) of the Act, 21 U.S.C.section 360bbb-3(b)(1), unless the authorization is terminated  or revoked sooner.       Influenza A by PCR NEGATIVE NEGATIVE Final   Influenza B by PCR NEGATIVE NEGATIVE Final    Comment: (NOTE) The Xpert Xpress SARS-CoV-2/FLU/RSV plus assay is intended as an aid in the diagnosis of influenza from Nasopharyngeal swab specimens and should not be used as a sole basis for treatment. Nasal washings and aspirates are unacceptable for Xpert Xpress SARS-CoV-2/FLU/RSV testing.  Fact Sheet for Patients: EntrepreneurPulse.com.au  Fact Sheet for Healthcare Providers: IncredibleEmployment.be  This test is not yet approved or cleared by the [place] FDA and has been authorized for detection and/or diagnosis of SARS-CoV-2 by FDA under an Emergency Use Authorization (EUA). This EUA will remain in effect (meaning this test can be used) for the duration of the COVID-19 declaration under Section 564(b)(1) of the Act, 21 U.S.C. section 360bbb-3(b)(1), unless the authorization is terminated or revoked.  Performed at [facility], [place] [address] [place], [place] [address]   Blood Culture (routine x 2)     Status: Abnormal   Collection Time: 05/12/21  9:19 PM   Specimen: BLOOD  Result Value Ref Range Status   Specimen Description BLOOD SITE NOT SPECIFIED  Final    Special Requests   Final    BOTTLES DRAWN AEROBIC AND ANAEROBIC Blood Culture adequate volume   Culture  Setup Time (A)  Final    GRAM VARIABLE ROD IN BOTH AEROBIC AND ANAEROBIC BOTTLES CRITICAL RESULT CALLED TO, READ BACK BY AND VERIFIED WITH: PHARMD E MARTIN [id number] AT 1447 BY CM Performed at [facility], [place] [address] [place], [place] [address]    Culture ESCHERICHIA COLI (A)  Final   Report Status 05/16/2021 FINAL  Final   Organism ID, Bacteria ESCHERICHIA COLI  Final      Susceptibility   Escherichia coli - MIC*    AMPICILLIN >=32 RESISTANT Resistant     CEFAZOLIN 8 SENSITIVE Sensitive     CEFEPIME <=0.12 SENSITIVE Sensitive     CEFTAZIDIME <=1 SENSITIVE Sensitive     CEFTRIAXONE 0.5 SENSITIVE Sensitive     CIPROFLOXACIN <=0.25 SENSITIVE Sensitive     GENTAMICIN <=1 SENSITIVE Sensitive     IMIPENEM <=0.25 SENSITIVE Sensitive     TRIMETH/SULFA <=20 SENSITIVE Sensitive     AMPICILLIN/SULBACTAM >=32 RESISTANT Resistant     PIP/TAZO 8 SENSITIVE Sensitive     * ESCHERICHIA COLI  Blood Culture ID Panel (Reflexed)     Status: Abnormal   Collection Time: 05/12/21  9:19 PM  Result Value Ref Range Status   Enterococcus faecalis NOT DETECTED NOT DETECTED Final   Enterococcus Faecium NOT DETECTED NOT DETECTED Final   Listeria monocytogenes NOT DETECTED NOT DETECTED Final   Staphylococcus  species NOT DETECTED NOT DETECTED Final   Staphylococcus aureus (BCID) NOT DETECTED NOT DETECTED Final   Staphylococcus epidermidis NOT DETECTED NOT DETECTED Final   Staphylococcus lugdunensis NOT DETECTED NOT DETECTED Final   Streptococcus species NOT DETECTED NOT DETECTED Final   Streptococcus agalactiae NOT DETECTED NOT DETECTED Final   Streptococcus pneumoniae NOT DETECTED NOT DETECTED Final   Streptococcus pyogenes NOT DETECTED NOT DETECTED Final   A.calcoaceticus-baumannii NOT DETECTED NOT DETECTED Final   Bacteroides fragilis NOT DETECTED NOT DETECTED Final   Enterobacterales DETECTED  (A) NOT DETECTED Final    Comment: Enterobacterales represent a large order of gram negative bacteria, not a single organism. CRITICAL RESULT CALLED TO, READ BACK BY AND VERIFIED WITH: PHARMD E MARTIN [id number] AR 1457 BY CM    Enterobacter cloacae complex NOT DETECTED NOT DETECTED Final   Escherichia coli DETECTED (A) NOT DETECTED Final    Comment: CRITICAL RESULT CALLED TO, READ BACK BY AND VERIFIED WITH: PHARMD E MARTIN [id number] AT 1457 BY CM    Klebsiella aerogenes NOT DETECTED NOT DETECTED Final   Klebsiella oxytoca NOT DETECTED NOT DETECTED Final   Klebsiella pneumoniae NOT DETECTED NOT DETECTED Final   Proteus species NOT DETECTED NOT DETECTED Final   Salmonella species NOT DETECTED NOT DETECTED Final   Serratia marcescens NOT DETECTED NOT DETECTED Final   Haemophilus influenzae NOT DETECTED NOT DETECTED Final   Neisseria meningitidis NOT DETECTED NOT DETECTED Final   Pseudomonas aeruginosa NOT DETECTED NOT DETECTED Final   Stenotrophomonas maltophilia NOT DETECTED NOT DETECTED Final   Candida albicans NOT DETECTED NOT DETECTED Final   Candida auris NOT DETECTED NOT DETECTED Final   Candida glabrata NOT DETECTED NOT DETECTED Final   Candida krusei NOT DETECTED NOT DETECTED Final   Candida parapsilosis NOT DETECTED NOT DETECTED Final   Candida tropicalis NOT DETECTED NOT DETECTED Final   Cryptococcus neoformans/gattii NOT DETECTED NOT DETECTED Final   CTX-M ESBL NOT DETECTED NOT DETECTED Final   Carbapenem resistance IMP NOT DETECTED NOT DETECTED Final   Carbapenem resistance KPC NOT DETECTED NOT DETECTED Final   Carbapenem resistance NDM NOT DETECTED NOT DETECTED Final   Carbapenem resist OXA 48 LIKE NOT DETECTED NOT DETECTED Final   Carbapenem resistance VIM NOT DETECTED NOT DETECTED Final    Comment: Performed at [facility], [address], [place], [place] [address]  Blood Culture (routine x 2)     Status: Abnormal   Collection Time: 05/12/21  9:37 PM   Specimen:  BLOOD  Result Value Ref Range Status   Specimen Description BLOOD SITE NOT SPECIFIED  Final   Special Requests   Final    BOTTLES DRAWN AEROBIC AND ANAEROBIC Blood Culture adequate volume   Culture  Setup Time (A)  Final    GRAM VARIABLE ROD IN BOTH AEROBIC AND ANAEROBIC BOTTLES CRITICAL VALUE NOTED.  VALUE IS CONSISTENT WITH PREVIOUSLY REPORTED AND CALLED VALUE.    Culture (A)  Final    ESCHERICHIA COLI SUSCEPTIBILITIES PERFORMED ON PREVIOUS CULTURE WITHIN THE LAST 5 DAYS. Performed at [facility], [place] [address], [place], [place] [address]    Report Status 05/15/2021 FINAL  Final  Urine Culture     Status: None   Collection Time: 05/13/21  3:06 AM   Specimen: Urine, Clean Catch  Result Value Ref Range Status   Specimen Description URINE, CLEAN CATCH  Final   Special Requests NONE  Final   Culture   Final    NO GROWTH Performed  at [facility], [place] [address] [place], [place] [address]    Report Status 05/14/2021 FINAL  Final    RADIOLOGY STUDIES/RESULTS: No results found.   LOS: 5 days   Oren Binet, MD  [facility]    To contact the attending provider between 7A-7P or the covering provider during after hours 7P-7A, please log into the web site [email or web address] and access using universal [place] password for that web site. If you do not have the password, please call the hospital operator.  05/18/2021, 1:44 PM

## 2021-05-18 NOTE — Anesthesia Postprocedure Evaluation (Signed)
Anesthesia Post Note  Patient: Florencio Hollibaugh  Procedure(s) Performed: ESOPHAGOGASTRODUODENOSCOPY (EGD) WITH PROPOFOL UPPER ENDOSCOPIC ULTRASOUND (EUS) LINEAR (Left) FINE NEEDLE ASPIRATION (FNA) LINEAR     Patient location during evaluation: Endoscopy Anesthesia Type: MAC Level of consciousness: awake Pain management: pain level controlled Vital Signs Assessment: post-procedure vital signs reviewed and stable Respiratory status: spontaneous breathing Cardiovascular status: stable Postop Assessment: no apparent nausea or vomiting Anesthetic complications: no   No notable events documented.  Last Vitals:  Vitals:   05/18/21 1415 05/18/21 1420  BP: 118/70   Pulse: 62 62  Resp: 17 16  Temp:  36.7 C  SpO2: 100% 100%    Last Pain:  Vitals:   05/18/21 1415  TempSrc:   PainSc: 0-No pain                 Jaylaa Gallion

## 2021-05-18 NOTE — Care Management (Signed)
LVM for daughter requesting callback. Chapa,Latoya Daughter   [phone]

## 2021-05-18 NOTE — TOC Benefit Eligibility Note (Addendum)
Patient Teacher, English as a foreign language completed.    The patient is currently admitted and upon discharge could be taking Eliquis 5 mg.  The current 30 day co-pay is, $482.00 due to a $435.00 deductible remaining.   The patient is currently admitted and upon discharge could be taking Xarelto 20 mg.  The current 30 day co-pay is, $482.00 due to a $435.00 deductible remaining.   The patient is insured through [place], [place] Patient Advocate Specialist [place] Patient Advocate Team Direct Number: [phone]  Fax: [phone]

## 2021-05-18 NOTE — H&P (View-Only) (Signed)
Subjective: Has some abdominal pain after medication given to help move his bowels.  Otherwise no other acute issues  ROS: See above, otherwise other systems negative  Objective: Vital signs in last 24 hours: Temp:  [98.2 F (36.8 C)-99 F (37.2 C)] 98.2 F (36.8 C) (12/07 0721) Pulse Rate:  [70-93] 70 (12/07 0721) Resp:  [15-25] 18 (12/07 0721) BP: (117-135)/(63-82) 122/70 (12/07 0721) SpO2:  [93 %-95 %] 93 % (12/07 0721) Last BM Date: 05/16/21  Intake/Output from previous day: 12/06 0701 - 12/07 0700 In: 1509.6 [P.O.:120; I.V.:989.6; IV Piggyback:400] Out: 900 [Urine:900] Intake/Output this shift: No intake/output data recorded.  PE: Gen: non-ill appearing Heart: regular Lungs: CTAB Abd: soft, minimally tender diffusely, not focally in RUQ, +BS, ND  Lab Results:  Recent Labs    05/17/21 0111 05/18/21 0124  WBC 7.9 6.2  HGB 13.4 12.7*  HCT 39.4 37.2*  PLT 155 190   BMET Recent Labs    05/16/21 0050 05/18/21 0124  NA 130* 131*  K 4.0 3.7  CL 101 97*  CO2 23 23  GLUCOSE 90 78  BUN 5* 5*  CREATININE 0.69 0.73  CALCIUM 8.0* 8.0*   PT/INR No results for input(s): LABPROT, INR in the last 72 hours. CMP     Component Value Date/Time   NA 131 (L) 05/18/2021 0124   K 3.7 05/18/2021 0124   CL 97 (L) 05/18/2021 0124   CO2 23 05/18/2021 0124   GLUCOSE 78 05/18/2021 0124   BUN 5 (L) 05/18/2021 0124   CREATININE 0.73 05/18/2021 0124   CREATININE 0.81 11/03/2015 0918   CALCIUM 8.0 (L) 05/18/2021 0124   PROT 5.2 (L) 05/18/2021 0124   PROT 7.5 12/17/2019 0831   ALBUMIN 2.0 (L) 05/18/2021 0124   AST 30 05/18/2021 0124   ALT 17 05/18/2021 0124   ALKPHOS 88 05/18/2021 0124   BILITOT 1.4 (H) 05/18/2021 0124   GFRNONAA >60 05/18/2021 0124   GFRNONAA 88 11/03/2015 0918   GFRAA >60 02/29/2020 0054   GFRAA >89 11/03/2015 0918   Lipase     Component Value Date/Time   LIPASE 28 04/03/2021 2011       Studies/Results: No results  found.  Anti-infectives: Anti-infectives (From admission, onward)    Start     Dose/Rate Route Frequency Ordered Stop   05/18/21 2000  metroNIDAZOLE (FLAGYL) IVPB 500 mg        500 mg 100 mL/hr over 60 Minutes Intravenous Every 12 hours 05/18/21 0837     05/14/21 1800  cefTRIAXone (ROCEPHIN) 2 g in sodium chloride 0.9 % 100 mL IVPB        2 g 200 mL/hr over 30 Minutes Intravenous Every 24 hours 05/14/21 1429     05/14/21 1600  metroNIDAZOLE (FLAGYL) IVPB 500 mg  Status:  Discontinued        500 mg 100 mL/hr over 60 Minutes Intravenous Every 8 hours 05/14/21 1429 05/18/21 0837   05/13/21 0600  piperacillin-tazobactam (ZOSYN) IVPB 3.375 g  Status:  Discontinued        3.375 g 12.5 mL/hr over 240 Minutes Intravenous Every 8 hours 05/13/21 0305 05/14/21 1429   05/12/21 2130  piperacillin-tazobactam (ZOSYN) IVPB 3.375 g        3.375 g 100 mL/hr over 30 Minutes Intravenous  Once 05/12/21 2124 05/12/21 2234   05/12/21 2115  ceFEPIme (MAXIPIME) 2 g in sodium chloride 0.9 % 100 mL IVPB  Status:  Discontinued  2 g 200 mL/hr over 30 Minutes Intravenous  Once 05/12/21 2114 05/12/21 2124   05/12/21 2115  metroNIDAZOLE (FLAGYL) IVPB 500 mg  Status:  Discontinued        500 mg 100 mL/hr over 60 Minutes Intravenous  Once 05/12/21 2114 05/12/21 2124   05/12/21 2115  vancomycin (VANCOREADY) IVPB 1500 mg/300 mL  Status:  Discontinued        1,500 mg 150 mL/hr over 120 Minutes Intravenous  Once 05/12/21 2114 05/12/21 2124        Assessment/Plan Gallbladder wall thickening, E coli Bacteremia/upper abdominal adenopathy -await EUS today and FNA of lymph nodes to determine if he has a malignant process or not.  If unable to get diagnostic samples, could discuss with IR regarding attempt to biopsy area in the liver -hep A positive but appears old as only antibody positive -no evidence at this point of acute cholecystitis.  -no plans for surgical intervention.  More work up to further delineate  this process is needed. -will follow, but likely chart check awaiting path results from EUS.    FEN - NPO for EDU, IVFs VTE - lovenox 75mg  ID - Rocephin/Flagyl  Renal mass - per urology Small PE - on therapeutic lovenox  LOS: 5 days    Henreitta Cea , [facility] 05/18/2021, 9:44 AM Please see Amion for pager number during day hours 7:00am-4:30pm or 7:00am -11:30am on weekends

## 2021-05-19 ENCOUNTER — Telehealth: Payer: Self-pay | Admitting: Hematology

## 2021-05-19 ENCOUNTER — Telehealth: Payer: Self-pay | Admitting: Hematology and Oncology

## 2021-05-19 DIAGNOSIS — C259 Malignant neoplasm of pancreas, unspecified: Secondary | ICD-10-CM

## 2021-05-19 LAB — COMPREHENSIVE METABOLIC PANEL
ALT: 14 U/L (ref 0–44)
AST: 25 U/L (ref 15–41)
Albumin: 2 g/dL — ABNORMAL LOW (ref 3.5–5.0)
Alkaline Phosphatase: 85 U/L (ref 38–126)
Anion gap: 7 (ref 5–15)
BUN: <5 mg/dL — ABNORMAL LOW (ref 8–23)
CO2: 25 mmol/L (ref 22–32)
Calcium: 8 mg/dL — ABNORMAL LOW (ref 8.9–10.3)
Chloride: 96 mmol/L — ABNORMAL LOW (ref 98–111)
Creatinine, Ser: 0.64 mg/dL (ref 0.61–1.24)
GFR, Estimated: >60 mL/min (ref >60)
Glucose, Bld: 88 mg/dL (ref 70–99)
Potassium: 3.5 mmol/L (ref 3.5–5.1)
Sodium: 128 mmol/L — ABNORMAL LOW (ref 135–145)
Total Bilirubin: 0.9 mg/dL (ref 0.3–1.2)
Total Protein: 5.1 g/dL — ABNORMAL LOW (ref 6.5–8.1)

## 2021-05-19 LAB — CBC
HCT: 36.7 % — ABNORMAL LOW (ref 39.0–52.0)
Hemoglobin: 12.6 g/dL — ABNORMAL LOW (ref 13.0–17.0)
MCH: 29.5 pg (ref 26.0–34.0)
MCHC: 34.3 g/dL (ref 30.0–36.0)
MCV: 85.9 fL (ref 80.0–100.0)
Platelets: 216 10*3/uL (ref 150–400)
RBC: 4.27 MIL/uL (ref 4.22–5.81)
RDW: 15.2 % (ref 11.5–15.5)
WBC: 5.6 10*3/uL (ref 4.0–10.5)
nRBC: 0 % (ref 0.0–0.2)

## 2021-05-19 LAB — CYTOLOGY - NON PAP

## 2021-05-19 NOTE — Telephone Encounter (Signed)
I reviewed the case with hospitalist The patient has newly diagnosed pancreatic cancer He will be discharged tomorrow Overall, the patient is stable for outpatient consult in the near future I will reach out to GI navigator to schedule this as an outpatient consult

## 2021-05-19 NOTE — Op Note (Signed)
[facility] Patient Name: Andre [name] Procedure Date : 05/18/2021 MRN: [id number] Attending MD: Carol Ada , MD Date of Birth: October 04, 1940 CSN: [id number] Age: 80 Admit Type: Inpatient Procedure:                Upper EUS Indications:              Lymphadenopathy on CT scan Providers:                Carol Ada, MD, Jeanella Cara, RN, Janie                            Billups, Technician, Luan Moore, Technician,                            Neville Route, CRNA, Referring MD:              Medicines:                Propofol per Anesthesia Complications:            No immediate complications. Estimated Blood Loss:     Estimated blood loss: none. Procedure:                Pre-Anesthesia Assessment:                           - Prior to the procedure, a History and Physical                            was performed, and patient medications and                            allergies were reviewed. The patient's tolerance of                            previous anesthesia was also reviewed. The risks                            and benefits of the procedure and the sedation                            options and risks were discussed with the patient.                            All questions were answered, and informed consent                            was obtained. Prior Anticoagulants: The patient has                            taken no previous anticoagulant or antiplatelet                            agents. ASA Grade Assessment: III - A patient with  severe systemic disease. After reviewing the risks                            and benefits, the patient was deemed in                            satisfactory condition to undergo the procedure.                           - Sedation was administered by an anesthesia                            professional. Deep sedation was attained.                           After obtaining informed consent, the  endoscope was                            passed under direct vision. Throughout the                            procedure, the patient's blood pressure, pulse, and                            oxygen saturations were monitored continuously. The                            [id number] ([id number] Olympus linear ultrasound scope                            was introduced through the mouth, and advanced to                            the second part of duodenum. The upper EUS was                            accomplished without difficulty. The patient                            tolerated the procedure well. Scope In: Scope Out: Findings:      ENDOSONOGRAPHIC FINDING: :      Multiple malignant-appearing lymph nodes were visualized in the       peripancreatic region. These were fifteen mm from the primary tumor. The       largest measured 10 mm by 20 mm in maximal cross-sectional diameter. The       nodes were oval, heterogenous and had well defined margins. Fine needle       aspiration for cytology was performed. Color Doppler imaging was       utilized prior to needle puncture to confirm a lack of significant       vascular structures within the needle path. Five passes were made with       the 25 gauge needle using a transgastric approach. A stylet was used. A       cytotechnologist was present to evaluate the adequacy of  the specimen.       Final cytology results are pending.      Multiple peripancreatic lymph nodes were identified. The largest one       viewed measured approximatly 10 mm x 20 mm. Multiple passes with the 25       gauge FNA needle were performed through these lymph nodes and an       adequate sampling was obtained. In the hilar region of the liver a 20 -       30 mm node/mass/abnormality was identified. It was hyperechoic. The       gallbladder was not visualized after multiple attempts with       repositioning and tracing the CBD proximally. There was an area near       this  lesion that was anechoic, potentially being a portion of the       gallbladder. This area was adjacent or in the path of a potential FNA.       With this uncertainty about the location of the gallbladder in relation       to his lesion, an FNA was not performed. Impression:               - Multiple malignant-appearing lymph nodes were                            visualized in the peripancreatic region. Fine                            needle aspiration performed. Recommendation:           - Return patient to hospital ward for ongoing care.                           - Resume regular diet.                           - Resume Lovenox in 1 day. Procedure Code(s):        --- Professional ---                           [phone], Esophagogastroduodenoscopy, flexible,                            transoral; with transendoscopic ultrasound-guided                            intramural or transmural fine needle                            aspiration/biopsy(s), (includes endoscopic                            ultrasound examination limited to the esophagus,                            stomach or duodenum, and adjacent structures) Diagnosis Code(s):        --- Professional ---  I89.9, Noninfective disorder of lymphatic vessels                            and lymph nodes, unspecified                           R59.1, Generalized enlarged lymph nodes CPT copyright 2019 [employer]. All rights reserved. The codes documented in this report are preliminary and upon coder review may  be revised to meet current compliance requirements. Carol Ada, MD Carol Ada, MD 05/19/2021 7:33:09 AM This report has been signed electronically. Number of Addenda: 0

## 2021-05-19 NOTE — Progress Notes (Signed)
[place] [facility]  Date of Admission:  05/12/2021   Total days of inpatient antibiotics 7  Principal Problem:   Sepsis ([place]) Active Problems:   Essential hypertension, benign   GERD (gastroesophageal reflux disease)   Hyperlipidemia   Acute cholecystitis   Pulmonary embolism [facility])          Assessment: 80 year old male with rectal cancer status post excision about 10 years ago, CAD, TIA admitted for sepsis secondary to E. coli bacteremia. PT had rigors and diarrhea, abdominal pain leading up to admission.    #E coli bacteremia 2/2 cholecystitis/intraabdominal infection #renal mass, necrotic lymphadenopathy, Hx of rectal cancer #Newly diagnosed primary ductal mucinous adenocarcinoma -CT chest showed small subsegmental PE, 1.1 cm non calcified lung nodule LUL -CT abdomen pelvis showed concern for cholecystitis, necrotic abdominal lymphadenopathy, renal mass suspicious for renal cancer -MRCP showed chronic cholecystitis with cholelithiasis, necrotic adenopathy, renal mass consistent with renal cancer. General surgery consulted and no plans for intervention as do not think pt has cholecystis. .  Recommended GI engagement for EUS with LN biopsy which showed  ductal mucinous pancreatic cancer. -Would still cover for intraabdominal infection as liver lesion(3x2.6cm) on MRCP is new since 04/03/21 imaging and chronic cholecystitis on current imaging. Particularly since these findings are in the setting of bacteremia.    Recommendations:  -Continue ceftriaxone and metronidazole -I had recommended aspiration of liver lesion for Cx. Per communication with general surgery there was concern of draining collection would interfering with future cancer surgery. If there are no plans for intervention with IR then the treatment for hepatic abscess without drainage is 4-6 weeks of parenteral antibiotics.      Microbiology:   Antibiotics: Pip-tazo 12/1-12/3 Ceftriaxone  12/3-p Metronidazole 12/3-p   Cultures: Blood 12/1 2/2 Ecoli Urine 12/1 no growth  SUBJECTIVE: Pt is resting in bed. Reports feeling fatigued.   Review of Systems: Review of Systems  All other systems reviewed and are negative.   Scheduled Meds:  aspirin EC  81 mg Oral Daily   atorvastatin  20 mg Oral Daily   donepezil  10 mg Oral QHS   enoxaparin (LOVENOX) injection  75 mg Subcutaneous Q12H   memantine  10 mg Oral BID   metoprolol tartrate  12.5 mg Oral BID   senna-docusate  2 tablet Oral BID   Continuous Infusions:  sodium chloride 10 mL/hr at 05/18/21 2258   cefTRIAXone (ROCEPHIN)  IV 2 g (05/19/21 1729)   metronidazole 500 mg (05/19/21 0812)   PRN Meds:.sodium chloride, acetaminophen **OR** [DISCONTINUED] acetaminophen, ondansetron **OR** ondansetron (ZOFRAN) IV, oxyCODONE, tiZANidine No Known Allergies  OBJECTIVE: Vitals:   05/19/21 0755 05/19/21 1154 05/19/21 1634 05/19/21 2000  BP: 121/64 122/80 128/80 128/72  Pulse: 70 76 80 86  Resp: 17 17 17  (!) 22  Temp: 98 F (36.7 C) 98 F (36.7 C) 97.9 F (36.6 C) 97.9 F (36.6 C)  TempSrc: Oral Oral Oral Oral  SpO2: 97% 99% 98% 95%  Weight:      Height:       Body mass index is 23.16 kg/m.  Physical Exam Constitutional:      General: He is not in acute distress.    Appearance: He is normal weight. He is not toxic-appearing.  HENT:     Head: Normocephalic and atraumatic.     Right Ear: External ear normal.     Left Ear: External ear normal.     Nose: No congestion or rhinorrhea.  Mouth/Throat:     Mouth: Mucous membranes are moist.     Pharynx: Oropharynx is clear.  Eyes:     Extraocular Movements: Extraocular movements intact.     Conjunctiva/sclera: Conjunctivae normal.     Pupils: Pupils are equal, round, and reactive to light.  Cardiovascular:     Rate and Rhythm: Normal rate and regular rhythm.     Heart sounds: No murmur heard.   No friction rub. No gallop.  Pulmonary:     Effort:  Pulmonary effort is normal.     Breath sounds: Normal breath sounds.  Abdominal:     General: Abdomen is flat. Bowel sounds are normal.     Palpations: Abdomen is soft.  Musculoskeletal:        General: No swelling. Normal range of motion.     Cervical back: Normal range of motion and neck supple.  Skin:    General: Skin is warm and dry.  Neurological:     General: No focal deficit present.     Mental Status: He is oriented to person, place, and time.  Psychiatric:        Mood and Affect: Mood normal.      Lab Results Lab Results  Component Value Date   WBC 5.6 05/19/2021   HGB 12.6 (L) 05/19/2021   HCT 36.7 (L) 05/19/2021   MCV 85.9 05/19/2021   PLT 216 05/19/2021    Lab Results  Component Value Date   CREATININE 0.64 05/19/2021   BUN <5 (L) 05/19/2021   NA 128 (L) 05/19/2021   K 3.5 05/19/2021   CL 96 (L) 05/19/2021   CO2 25 05/19/2021    Lab Results  Component Value Date   ALT 14 05/19/2021   AST 25 05/19/2021   ALKPHOS 85 05/19/2021   BILITOT 0.9 05/19/2021        Laurice Record, [place] [facility] 05/19/2021, 9:58 PM

## 2021-05-19 NOTE — Progress Notes (Signed)
I received an in basket message from Dr Alvy Bimler for referral for Pancreatic cancer.  Referral placed.

## 2021-05-19 NOTE — Progress Notes (Addendum)
PROGRESS NOTE        PATIENT DETAILS Name: Andre [name] Age: 80 y.o. Sex: male Date of Birth: 09/29/1940 Admit Date: 05/12/2021 Admitting Physician Rolla Plate, DO WLN:[id number], Hunt Oris, MD  Brief Narrative: Patient is a 80 y.o. male with history of rectal adenocarcinoma-s/p transanal excisional biopsy, CAD, GERD, HTN, TIA who presented with fever, abdominal pain-found to have sepsis secondary to E. coli bacteremia with concern that he may have underlying malignancy.  See below for further details.  Subjective: Lying comfortably in bed-minimal upper abdominal discomfort but no overt pain.  Objective: Vitals: Blood pressure 121/64, pulse 70, temperature 98 F (36.7 C), temperature source Oral, resp. rate 17, height 5\' 10"  (1.778 m), weight 73.2 kg, SpO2 97 %.   Exam: Gen Exam:Alert awake-not in any distress HEENT:atraumatic, normocephalic Chest: B/L clear to auscultation anteriorly CVS:S1S2 regular Abdomen:soft-minimally tender. Extremities:no edema Neurology: Non focal Skin: no rash   Pertinent Labs/Radiology: Recent Labs  Lab 05/12/21 1948 05/13/21 0508 05/13/21 1194 05/14/21 0632 05/15/21 0107 05/18/21 0124 05/19/21 0136  WBC 8.9 10.5  --  8.6   < > 6.2 5.6  HGB 16.7 14.1  --  13.5   < > 12.7* 12.6*  PLT 225 171  --  150   < > 190 216  NA 136 131*  --  131*   < > 131* 128*  K 4.3 5.2*   < > 3.7   < > 3.7 3.5  CREATININE 0.94 0.78  --  0.88   < > 0.73 0.64  AST 26 44*  --  41  --  30 25  ALT 20 22  --  21  --  17 14  ALKPHOS 116 94  --  74  --  88 85  BILITOT 3.5* 3.5*  --  2.2*  --  1.4* 0.9   < > = values in this interval not displayed.       Assessment/Plan: Sepsis secondary to E. coli bacteremia: Sepsis physiology has resolved-continue IV Rocephin/Flagyl-ID following.    Pulmonary embolism: Continue Lovenox.  Pericholecystic liver lesion/ upper abdominal adenopathy/mild intrahepatic and extrahepatic biliary ductal  dilatation: Suspicion for malignancy-however given gram-negative bacteremia-concerned that this may be a liver abscess as well.  Discussed with ID MD-Dr. Candiss Norse today-recommends that we consult IR to see if a liver biopsy is possible.  Patient did undergo EUS with FNA of numerous lymph nodes-biopsies currently pending.  Addendum 2 PM.: FNA biopsy positive for metastatic adenocarcinoma-discussed with ID MD-we will cancel liver biopsy.  Will discuss with oncology-but suspect will need further work-up to be done in the outpatient setting once he is recovered from his bacteremia.  Have updated daughter [name] this afternoon.  Cholelithiasis: Asymptomatic-per general surgery-overall clinical picture not consistent with cholecystitis-no plans for cholecystectomy or cholecystostomy drain.  Right renal lesion: Concerning for malignancy-outpatient follow-up with Dr. Abner Greenspan.  1.1 cm noncalcified left upper lobe lung nodule: We will need outpatient follow-up with repeat CT chest in 3 to 6 months.  If no evidence of biliary malignancy-May need work-up sooner  Hyponatremia: Mild-volume status appears stable-stop IVF-and reassess on 12/9.  HTN: BP stable-continue metoprolol.  HFrEF: Euvolemic on exam  HLD: Continue statin  Dementia: Appears to be mild-continue Aricept/Namenda  BMI Estimated body mass index is 23.16 kg/m as calculated from the following:   Height as of this encounter: 5'  10" (1.778 m).   Weight as of this encounter: 73.2 kg.   Procedures: 12/8>> EUS (Dr. Benson Norway) Consults: GI, general surgery DVT Prophylaxis: Lovenox  Code Status:Full code  Family Communication: Daughter-[phone]-on 12/8.  Time spent: 35 minutes-Greater than 50% of this time was spent in counseling, explanation of diagnosis, planning of further management, and coordination of care.   Disposition Plan: Status is: Inpatient  Remains inpatient appropriate because: Gram-negative bacteremia-concern for  intra-abdominal malignancy-for EUS today.    Diet: Diet Order             DIET SOFT Room service appropriate? No; Fluid consistency: Thin  Diet effective now                     Antimicrobial agents: Anti-infectives (From admission, onward)    Start     Dose/Rate Route Frequency Ordered Stop   05/18/21 2000  metroNIDAZOLE (FLAGYL) IVPB 500 mg        500 mg 100 mL/hr over 60 Minutes Intravenous Every 12 hours 05/18/21 0837     05/14/21 1800  cefTRIAXone (ROCEPHIN) 2 g in sodium chloride 0.9 % 100 mL IVPB        2 g 200 mL/hr over 30 Minutes Intravenous Every 24 hours 05/14/21 1429     05/14/21 1600  metroNIDAZOLE (FLAGYL) IVPB 500 mg  Status:  Discontinued        500 mg 100 mL/hr over 60 Minutes Intravenous Every 8 hours 05/14/21 1429 05/18/21 0837   05/13/21 0600  piperacillin-tazobactam (ZOSYN) IVPB 3.375 g  Status:  Discontinued        3.375 g 12.5 mL/hr over 240 Minutes Intravenous Every 8 hours 05/13/21 0305 05/14/21 1429   05/12/21 2130  piperacillin-tazobactam (ZOSYN) IVPB 3.375 g        3.375 g 100 mL/hr over 30 Minutes Intravenous  Once 05/12/21 2124 05/12/21 2234   05/12/21 2115  ceFEPIme (MAXIPIME) 2 g in sodium chloride 0.9 % 100 mL IVPB  Status:  Discontinued        2 g 200 mL/hr over 30 Minutes Intravenous  Once 05/12/21 2114 05/12/21 2124   05/12/21 2115  metroNIDAZOLE (FLAGYL) IVPB 500 mg  Status:  Discontinued        500 mg 100 mL/hr over 60 Minutes Intravenous  Once 05/12/21 2114 05/12/21 2124   05/12/21 2115  vancomycin (VANCOREADY) IVPB 1500 mg/300 mL  Status:  Discontinued        1,500 mg 150 mL/hr over 120 Minutes Intravenous  Once 05/12/21 2114 05/12/21 2124        MEDICATIONS: Scheduled Meds:  aspirin EC  81 mg Oral Daily   atorvastatin  20 mg Oral Daily   donepezil  10 mg Oral QHS   enoxaparin (LOVENOX) injection  75 mg Subcutaneous Q12H   memantine  10 mg Oral BID   metoprolol tartrate  12.5 mg Oral BID   senna-docusate  2 tablet Oral  BID   Continuous Infusions:  sodium chloride 10 mL/hr at 05/18/21 2258   cefTRIAXone (ROCEPHIN)  IV Stopped (05/18/21 1757)   metronidazole 500 mg (05/19/21 0812)   PRN Meds:.sodium chloride, acetaminophen **OR** [DISCONTINUED] acetaminophen, ondansetron **OR** ondansetron (ZOFRAN) IV, oxyCODONE, tiZANidine   I have personally reviewed following labs and imaging studies  LABORATORY DATA: CBC: Recent Labs  Lab 05/12/21 1948 05/13/21 0508 05/14/21 0632 05/15/21 0107 05/16/21 0050 05/17/21 0111 05/18/21 0124 05/19/21 0136  WBC 8.9 10.5   < > 8.0 6.8 7.9 6.2 5.6  NEUTROABS 7.6 9.6*  --   --   --   --   --   --   HGB 16.7 14.1   < > 12.8* 13.1 13.4 12.7* 12.6*  HCT 51.5 42.5   < > 38.8* 38.4* 39.4 37.2* 36.7*  MCV 92.0 89.9   < > 89.2 87.9 87.4 86.5 85.9  PLT 225 171   < > 146* 149* 155 190 216   < > = values in this interval not displayed.     Basic Metabolic Panel: Recent Labs  Lab 05/13/21 0508 05/13/21 0632 05/14/21 0632 05/15/21 0107 05/16/21 0050 05/18/21 0124 05/19/21 0136  NA 131*  --  131* 131* 130* 131* 128*  K 5.2*   < > 3.7 3.1* 4.0 3.7 3.5  CL 97*  --  100 98 101 97* 96*  CO2 27  --  23 25 23 23 25   GLUCOSE 96  --  96 86 90 78 88  BUN 10  --  7* 7* 5* 5* <5*  CREATININE 0.78  --  0.88 0.83 0.69 0.73 0.64  CALCIUM 8.7*  --  8.6* 8.1* 8.0* 8.0* 8.0*  MG 1.7  --   --   --   --   --   --    < > = values in this interval not displayed.     GFR: Estimated Creatinine Clearance: 77.3 mL/min (by C-G formula based on SCr of 0.64 mg/dL).  Liver Function Tests: Recent Labs  Lab 05/12/21 1948 05/13/21 0508 05/14/21 0632 05/18/21 0124 05/19/21 0136  AST 26 44* 41 30 25  ALT 20 22 21 17 14   ALKPHOS 116 94 74 88 85  BILITOT 3.5* 3.5* 2.2* 1.4* 0.9  PROT 8.1 6.1* 5.6* 5.2* 5.1*  ALBUMIN 4.1 3.0* 2.6* 2.0* 2.0*    No results for input(s): LIPASE, AMYLASE in the last 168 hours. No results for input(s): AMMONIA in the last 168 hours.  Coagulation  Profile: Recent Labs  Lab 05/12/21 2137 05/13/21 0508  INR 1.1 1.3*     Cardiac Enzymes: No results for input(s): CKTOTAL, CKMB, CKMBINDEX, TROPONINI in the last 168 hours.  BNP (last 3 results) No results for input(s): PROBNP in the last 8760 hours.  Lipid Profile: No results for input(s): CHOL, HDL, LDLCALC, TRIG, CHOLHDL, LDLDIRECT in the last 72 hours.  Thyroid Function Tests: No results for input(s): TSH, T4TOTAL, FREET4, T3FREE, THYROIDAB in the last 72 hours.  Anemia Panel: No results for input(s): VITAMINB12, FOLATE, FERRITIN, TIBC, IRON, RETICCTPCT in the last 72 hours.  Urine analysis:    Component Value Date/Time   COLORURINE YELLOW 05/12/2021 1927   APPEARANCEUR CLEAR 05/12/2021 1927   LABSPEC 1.020 05/12/2021 1927   PHURINE 6.0 05/12/2021 1927   GLUCOSEU NEGATIVE 05/12/2021 1927   GLUCOSEU NEGATIVE 03/28/2021 1018   HGBUR NEGATIVE 05/12/2021 1927   BILIRUBINUR SMALL (A) 05/12/2021 1927   BILIRUBINUR neg 05/10/2017 0909   KETONESUR 15 (A) 05/12/2021 1927   PROTEINUR NEGATIVE 05/12/2021 1927   UROBILINOGEN 0.2 03/28/2021 1018   NITRITE NEGATIVE 05/12/2021 1927   LEUKOCYTESUR NEGATIVE 05/12/2021 1927    Sepsis Labs: Lactic Acid, Venous    Component Value Date/Time   LATICACIDVEN 1.2 05/13/2021 0750    MICROBIOLOGY: Recent Results (from the past 240 hour(s))  Resp Panel by RT-PCR (Flu A&B, Covid) Nasopharyngeal Swab     Status: None   Collection Time: 05/12/21  9:14 PM   Specimen: Nasopharyngeal Swab; Nasopharyngeal(NP) swabs in vial transport medium  Result  Value Ref Range Status   SARS Coronavirus 2 by RT PCR NEGATIVE NEGATIVE Final    Comment: (NOTE) SARS-CoV-2 target nucleic acids are NOT DETECTED.  The SARS-CoV-2 RNA is generally detectable in upper respiratory specimens during the acute phase of infection. The lowest concentration of SARS-CoV-2 viral copies this assay can detect is 138 copies/mL. A negative result does not preclude  SARS-Cov-2 infection and should not be used as the sole basis for treatment or other patient management decisions. A negative result may occur with  improper specimen collection/handling, submission of specimen other than nasopharyngeal swab, presence of viral mutation(s) within the areas targeted by this assay, and inadequate number of viral copies(<138 copies/mL). A negative result must be combined with clinical observations, patient history, and epidemiological information. The expected result is Negative.  Fact Sheet for Patients:  EntrepreneurPulse.com.au  Fact Sheet for Healthcare Providers:  IncredibleEmployment.be  This test is no t yet approved or cleared by the [place] FDA and  has been authorized for detection and/or diagnosis of SARS-CoV-2 by FDA under an Emergency Use Authorization (EUA). This EUA will remain  in effect (meaning this test can be used) for the duration of the COVID-19 declaration under Section 564(b)(1) of the Act, 21 U.S.C.section 360bbb-3(b)(1), unless the authorization is terminated  or revoked sooner.       Influenza A by PCR NEGATIVE NEGATIVE Final   Influenza B by PCR NEGATIVE NEGATIVE Final    Comment: (NOTE) The Xpert Xpress SARS-CoV-2/FLU/RSV plus assay is intended as an aid in the diagnosis of influenza from Nasopharyngeal swab specimens and should not be used as a sole basis for treatment. Nasal washings and aspirates are unacceptable for Xpert Xpress SARS-CoV-2/FLU/RSV testing.  Fact Sheet for Patients: EntrepreneurPulse.com.au  Fact Sheet for Healthcare Providers: IncredibleEmployment.be  This test is not yet approved or cleared by the [place] FDA and has been authorized for detection and/or diagnosis of SARS-CoV-2 by FDA under an Emergency Use Authorization (EUA). This EUA will remain in effect (meaning this test can be used) for the duration of  the COVID-19 declaration under Section 564(b)(1) of the Act, 21 U.S.C. section 360bbb-3(b)(1), unless the authorization is terminated or revoked.  Performed at [facility], [place] [address], [place], [place] [address]   Blood Culture (routine x 2)     Status: Abnormal   Collection Time: 05/12/21  9:19 PM   Specimen: BLOOD  Result Value Ref Range Status   Specimen Description BLOOD SITE NOT SPECIFIED  Final   Special Requests   Final    BOTTLES DRAWN AEROBIC AND ANAEROBIC Blood Culture adequate volume   Culture  Setup Time (A)  Final    GRAM VARIABLE ROD IN BOTH AEROBIC AND ANAEROBIC BOTTLES CRITICAL RESULT CALLED TO, READ BACK BY AND VERIFIED WITH: PHARMD E MARTIN [id number] AT 1447 BY CM Performed at [facility], [place] [address] [place], [place] [address]    Culture ESCHERICHIA COLI (A)  Final   Report Status 05/16/2021 FINAL  Final   Organism ID, Bacteria ESCHERICHIA COLI  Final      Susceptibility   Escherichia coli - MIC*    AMPICILLIN >=32 RESISTANT Resistant     CEFAZOLIN 8 SENSITIVE Sensitive     CEFEPIME <=0.12 SENSITIVE Sensitive     CEFTAZIDIME <=1 SENSITIVE Sensitive     CEFTRIAXONE 0.5 SENSITIVE Sensitive     CIPROFLOXACIN <=0.25 SENSITIVE Sensitive     GENTAMICIN <=1 SENSITIVE Sensitive     IMIPENEM <=0.25  SENSITIVE Sensitive     TRIMETH/SULFA <=20 SENSITIVE Sensitive     AMPICILLIN/SULBACTAM >=32 RESISTANT Resistant     PIP/TAZO 8 SENSITIVE Sensitive     * ESCHERICHIA COLI  Blood Culture ID Panel (Reflexed)     Status: Abnormal   Collection Time: 05/12/21  9:19 PM  Result Value Ref Range Status   Enterococcus faecalis NOT DETECTED NOT DETECTED Final   Enterococcus Faecium NOT DETECTED NOT DETECTED Final   Listeria monocytogenes NOT DETECTED NOT DETECTED Final   Staphylococcus species NOT DETECTED NOT DETECTED Final   Staphylococcus aureus (BCID) NOT DETECTED NOT DETECTED Final   Staphylococcus epidermidis NOT DETECTED NOT DETECTED Final    Staphylococcus lugdunensis NOT DETECTED NOT DETECTED Final   Streptococcus species NOT DETECTED NOT DETECTED Final   Streptococcus agalactiae NOT DETECTED NOT DETECTED Final   Streptococcus pneumoniae NOT DETECTED NOT DETECTED Final   Streptococcus pyogenes NOT DETECTED NOT DETECTED Final   A.calcoaceticus-baumannii NOT DETECTED NOT DETECTED Final   Bacteroides fragilis NOT DETECTED NOT DETECTED Final   Enterobacterales DETECTED (A) NOT DETECTED Final    Comment: Enterobacterales represent a large order of gram negative bacteria, not a single organism. CRITICAL RESULT CALLED TO, READ BACK BY AND VERIFIED WITH: PHARMD E MARTIN [id number] AR 1457 BY CM    Enterobacter cloacae complex NOT DETECTED NOT DETECTED Final   Escherichia coli DETECTED (A) NOT DETECTED Final    Comment: CRITICAL RESULT CALLED TO, READ BACK BY AND VERIFIED WITH: PHARMD E MARTIN [id number] AT 1457 BY CM    Klebsiella aerogenes NOT DETECTED NOT DETECTED Final   Klebsiella oxytoca NOT DETECTED NOT DETECTED Final   Klebsiella pneumoniae NOT DETECTED NOT DETECTED Final   Proteus species NOT DETECTED NOT DETECTED Final   Salmonella species NOT DETECTED NOT DETECTED Final   Serratia marcescens NOT DETECTED NOT DETECTED Final   Haemophilus influenzae NOT DETECTED NOT DETECTED Final   Neisseria meningitidis NOT DETECTED NOT DETECTED Final   Pseudomonas aeruginosa NOT DETECTED NOT DETECTED Final   Stenotrophomonas maltophilia NOT DETECTED NOT DETECTED Final   Candida albicans NOT DETECTED NOT DETECTED Final   Candida auris NOT DETECTED NOT DETECTED Final   Candida glabrata NOT DETECTED NOT DETECTED Final   Candida krusei NOT DETECTED NOT DETECTED Final   Candida parapsilosis NOT DETECTED NOT DETECTED Final   Candida tropicalis NOT DETECTED NOT DETECTED Final   Cryptococcus neoformans/gattii NOT DETECTED NOT DETECTED Final   CTX-M ESBL NOT DETECTED NOT DETECTED Final   Carbapenem resistance IMP NOT DETECTED NOT DETECTED Final    Carbapenem resistance KPC NOT DETECTED NOT DETECTED Final   Carbapenem resistance NDM NOT DETECTED NOT DETECTED Final   Carbapenem resist OXA 48 LIKE NOT DETECTED NOT DETECTED Final   Carbapenem resistance VIM NOT DETECTED NOT DETECTED Final    Comment: Performed at [facility], [address] [place], [place] [address]  Blood Culture (routine x 2)     Status: Abnormal   Collection Time: 05/12/21  9:37 PM   Specimen: BLOOD  Result Value Ref Range Status   Specimen Description BLOOD SITE NOT SPECIFIED  Final   Special Requests   Final    BOTTLES DRAWN AEROBIC AND ANAEROBIC Blood Culture adequate volume   Culture  Setup Time (A)  Final    GRAM VARIABLE ROD IN BOTH AEROBIC AND ANAEROBIC BOTTLES CRITICAL VALUE NOTED.  VALUE IS CONSISTENT WITH PREVIOUSLY REPORTED AND CALLED VALUE.    Culture (A)  Final    ESCHERICHIA COLI  SUSCEPTIBILITIES PERFORMED ON PREVIOUS CULTURE WITHIN THE LAST 5 DAYS. Performed at [facility], [place] [address], [place], [place] [address]    Report Status 05/15/2021 FINAL  Final  Urine Culture     Status: None   Collection Time: 05/13/21  3:06 AM   Specimen: Urine, Clean Catch  Result Value Ref Range Status   Specimen Description URINE, CLEAN CATCH  Final   Special Requests NONE  Final   Culture   Final    NO GROWTH Performed at [facility], [place] [address] [place], [place] [address]    Report Status 05/14/2021 FINAL  Final    RADIOLOGY STUDIES/RESULTS: No results found.   LOS: 6 days   Andre Binet, MD  [facility]    To contact the attending provider between 7A-7P or the covering provider during after hours 7P-7A, please log into the web site [email or web address] and access using universal [place] password for that web site. If you do not have the password, please call the hospital operator.  05/19/2021, 10:57 AM

## 2021-05-19 NOTE — Progress Notes (Signed)
Discussed EUS with him from yesterday and concern for malignancy in this area.  Discussed pathology may come back in 24 hrs vs early next week.  He will follow up with Dr. Zenia Resides our [facility] specialist as an outpatient.  No further surgical needs at this time.  Suspect this process is all related to a malignant process.  Bacteremia may be secondary to acute infection on malignant process.  No evidence of acute/chronic cholecystitis, suspect this appearance is secondary to malignancy.  Abx duration for bacteremia per primary/ID.  He may DC home from our standpoint when stable medically with surgical follow up.  Henreitta Cea 8:21 AM 05/19/2021

## 2021-05-19 NOTE — Progress Notes (Signed)
This chaplain responded to the consult requesting a phone call to the Pt. daughter-Latoya for POA information.  The chaplain phoned Latoya at the number in the Pt. Contacts.  The chaplain understands the [name] is interested in the Pt. completing HCPOA in the hospital.  The chaplain understands [name] will ask the RN to place a consult to spiritual care for creating/updating the Pt. Advance Directive and spiritual care will F/U.

## 2021-05-19 NOTE — Telephone Encounter (Signed)
Scheduled appt per 12/8 referral. Called pt, no answer. Left msg for pt with appt date and time. Also requested for pt to call me back to confirm appt.

## 2021-05-19 NOTE — Progress Notes (Signed)
Subjective: No complaints after the EUS.  Objective: Vital signs in last 24 hours: Temp:  [98 F (36.7 C)-98.4 F (36.9 C)] 98 F (36.7 C) (12/08 1154) Pulse Rate:  [62-76] 76 (12/08 1154) Resp:  [16-21] 17 (12/08 1154) BP: (104-140)/(52-80) 122/80 (12/08 1154) SpO2:  [95 %-100 %] 99 % (12/08 1154) Last BM Date: 05/16/21  Intake/Output from previous day: 12/07 0701 - 12/08 0700 In: 1280 [P.O.:60; I.V.:1020; IV Piggyback:200] Out: 850 [Urine:850] Intake/Output this shift: Total I/O In: 133 [P.O.:133] Out: -   General appearance: alert and no distress GI: soft, non-tender; bowel sounds normal; no masses,  no organomegaly  Lab Results: Recent Labs    05/17/21 0111 05/18/21 0124 05/19/21 0136  WBC 7.9 6.2 5.6  HGB 13.4 12.7* 12.6*  HCT 39.4 37.2* 36.7*  PLT 155 190 216   BMET Recent Labs    05/18/21 0124 05/19/21 0136  NA 131* 128*  K 3.7 3.5  CL 97* 96*  CO2 23 25  GLUCOSE 78 88  BUN 5* <5*  CREATININE 0.73 0.64  CALCIUM 8.0* 8.0*   LFT Recent Labs    05/19/21 0136  PROT 5.1*  ALBUMIN 2.0*  AST 25  ALT 14  ALKPHOS 85  BILITOT 0.9   PT/INR No results for input(s): LABPROT, INR in the last 72 hours. Hepatitis Panel Recent Labs    05/16/21 1553 05/17/21 0856  HEPBSAG NON REACTIVE  --   HCVAB NON REACTIVE  --   HEPAIGM  --  NON REACTIVE   C-Diff No results for input(s): CDIFFTOX in the last 72 hours. Fecal Lactopherrin No results for input(s): FECLLACTOFRN in the last 72 hours.  Studies/Results: No results found.  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 2) Bacteremia.   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  The cell block evaluation is pending.  This information was shared with the patient's daughters as well as the patient.  Plan: 1) Await FNA results.  LOS: 6 days   Hart Haas D 05/19/2021, 12:44 PM

## 2021-05-20 ENCOUNTER — Encounter (HOSPITAL_COMMUNITY): Payer: Self-pay | Admitting: Gastroenterology

## 2021-05-20 ENCOUNTER — Other Ambulatory Visit (HOSPITAL_COMMUNITY): Payer: Self-pay

## 2021-05-20 ENCOUNTER — Inpatient Hospital Stay: Payer: Self-pay

## 2021-05-20 LAB — CBC
HCT: 36.5 % — ABNORMAL LOW (ref 39.0–52.0)
Hemoglobin: 12.6 g/dL — ABNORMAL LOW (ref 13.0–17.0)
MCH: 29.7 pg (ref 26.0–34.0)
MCHC: 34.5 g/dL (ref 30.0–36.0)
MCV: 86.1 fL (ref 80.0–100.0)
Platelets: 257 10*3/uL (ref 150–400)
RBC: 4.24 MIL/uL (ref 4.22–5.81)
RDW: 15.4 % (ref 11.5–15.5)
WBC: 6.3 10*3/uL (ref 4.0–10.5)
nRBC: 0 % (ref 0.0–0.2)

## 2021-05-20 LAB — BASIC METABOLIC PANEL
Anion gap: 7 (ref 5–15)
BUN: <5 mg/dL — ABNORMAL LOW (ref 8–23)
CO2: 27 mmol/L (ref 22–32)
Calcium: 7.9 mg/dL — ABNORMAL LOW (ref 8.9–10.3)
Chloride: 96 mmol/L — ABNORMAL LOW (ref 98–111)
Creatinine, Ser: 0.67 mg/dL (ref 0.61–1.24)
GFR, Estimated: >60 mL/min (ref >60)
Glucose, Bld: 100 mg/dL — ABNORMAL HIGH (ref 70–99)
Potassium: 3.4 mmol/L — ABNORMAL LOW (ref 3.5–5.1)
Sodium: 130 mmol/L — ABNORMAL LOW (ref 135–145)

## 2021-05-20 MED ORDER — APIXABAN 5 MG PO TABS: 5.0000 mg | ORAL_TABLET | Freq: Two times a day (BID) | ORAL | Status: DC

## 2021-05-20 MED ORDER — RIVAROXABAN 20 MG PO TABS: 20.0000 mg | ORAL_TABLET | Freq: Every day | ORAL | Status: DC

## 2021-05-20 MED ORDER — METRONIDAZOLE 500 MG PO TABS
500.0000 mg | ORAL_TABLET | Freq: Two times a day (BID) | ORAL | 0 refills | Status: DC
  Filled 2021-05-20: qty 40, 20d supply, fill #0

## 2021-05-20 MED ORDER — RIVAROXABAN 15 MG PO TABS: 15.0000 mg | ORAL_TABLET | Freq: Two times a day (BID) | ORAL | Status: DC

## 2021-05-20 MED ORDER — APIXABAN 5 MG PO TABS
10.0000 mg | ORAL_TABLET | Freq: Two times a day (BID) | ORAL | Status: DC
  Administered 2021-05-20 – 2021-05-21 (×2): 10 mg via ORAL
  Filled 2021-05-20 (×2): qty 2

## 2021-05-20 MED ORDER — PANTOPRAZOLE SODIUM 40 MG PO TBEC
40.0000 mg | DELAYED_RELEASE_TABLET | Freq: Every day | ORAL | 1 refills | Status: AC
  Filled 2021-05-20 – 2021-06-24 (×2): qty 30, 30d supply, fill #0

## 2021-05-20 MED ORDER — APIXABAN (ELIQUIS) VTE STARTER PACK (10MG AND 5MG)
ORAL_TABLET | ORAL | 0 refills | Status: DC
Start: 2021-05-20 — End: 2021-06-27
  Filled 2021-05-20 (×2): qty 74, 30d supply, fill #0

## 2021-05-20 MED ORDER — POTASSIUM CHLORIDE CRYS ER 20 MEQ PO TBCR
40.0000 meq | EXTENDED_RELEASE_TABLET | Freq: Once | ORAL | Status: AC
  Administered 2021-05-20: 40 meq via ORAL
  Filled 2021-05-20: qty 2

## 2021-05-20 MED ORDER — RIVAROXABAN (XARELTO) VTE STARTER PACK (15 & 20 MG)
ORAL_TABLET | ORAL | 0 refills | Status: DC
Start: 2021-05-20 — End: 2021-05-20
  Filled 2021-05-20: qty 51, 30d supply, fill #0

## 2021-05-20 MED ORDER — CEFTRIAXONE IV (FOR PTA / DISCHARGE USE ONLY): 2.0000 g | INTRAVENOUS | 0 refills | Status: DC

## 2021-05-20 NOTE — TOC Progression Note (Signed)
Transition of Care [facility]) - Progression Note    Patient Details  Name: Andre [name] MRN: [id number] Date of Birth: July 01, 1940  Transition of [facility] CM/SW [place], RN Phone Number: 05/20/2021, 9:17 AM  Clinical Narrative:    According to ID the patient will need a line and IV antibiotics for 6-8 weeks. Notified from [place] [facility]. Called [facility] to see if they had RN availability, they do not. Messaged [facility] for acceptance   Expected Discharge Plan: [place] Barriers to Discharge: Continued Medical Work up  Expected Discharge Plan and Services Expected Discharge Plan: [place]   Discharge Planning Services: CM Consult Post Acute Care Choice: [place] arrangements for the past 2 months: Single Family Home                                       Social Determinants of Health (SDOH) Interventions    Readmission Risk Interventions No flowsheet data found.

## 2021-05-20 NOTE — Discharge Instructions (Addendum)
Information on my medicine - ELIQUIS (apixaban)  This medication education was reviewed with me or my healthcare representative as part of my discharge preparation.   Why was Eliquis prescribed for you? Eliquis was prescribed to treat blood clots that may have been found in the veins of your legs (deep vein thrombosis) or in your lungs (pulmonary embolism) and to reduce the risk of them occurring again.  What do You need to know about Eliquis ? The starting dose is 10 mg (two 5 mg tablets) taken TWICE daily for the FIRST SEVEN (7) DAYS, then on 05/27/21 in the evening the dose is reduced to ONE 5 mg tablet taken TWICE daily.  Eliquis may be taken with or without food.   Try to take the dose about the same time in the morning and in the evening. If you have difficulty swallowing the tablet whole please discuss with your pharmacist how to take the medication safely.  Take Eliquis exactly as prescribed and DO NOT stop taking Eliquis without talking to the doctor who prescribed the medication.  Stopping may increase your risk of developing a new blood clot.  Refill your prescription before you run out.  After discharge, you should have regular check-up appointments with your healthcare provider that is prescribing your Eliquis.    What do you do if you miss a dose? If a dose of ELIQUIS is not taken at the scheduled time, take it as soon as possible on the same day and twice-daily administration should be resumed. The dose should not be doubled to make up for a missed dose.  Important Safety Information A possible side effect of Eliquis is bleeding. You should call your healthcare provider right away if you experience any of the following: Bleeding from an injury or your nose that does not stop. Unusual colored urine (red or dark brown) or unusual colored stools (red or black). Unusual bruising for unknown reasons. A serious fall or if you hit your head (even if there is no  bleeding).  Some medicines may interact with Eliquis and might increase your risk of bleeding or clotting while on Eliquis. To help avoid this, consult your healthcare provider or pharmacist prior to using any new prescription or non-prescription medications, including herbals, vitamins, non-steroidal anti-inflammatory drugs (NSAIDs) and supplements.  This website has more information on Eliquis (apixaban): http://www.eliquis.com/eliquis/home

## 2021-05-20 NOTE — TOC Transition Note (Addendum)
Transition of Care [facility]) - CM/SW Discharge Note   Patient Details  Name: Andre [name] MRN: [id number] Date of Birth: 10/28/1940  [facility] CM/SW Contact:  Verdell Carmine, RN Phone Number: 05/20/2021, 12:37 PM   Clinical Narrative:     PT recommended walker. Ordered RW from [facility].  Set up with [facility] for RN,  and [facility] home infusion for medication.  Messaged MD to sign OPAT orders.  1400 spoke with patient, he feels he has everything needed. Advanced to do education with daughter at 3pm.  No further needs awaiting PICC line insertion and education then will DC   Barriers to Discharge: Continued Medical Work up   Patient Goals and CMS Choice Patient states their goals for this hospitalization and ongoing recovery are:: to go home CMS Medicare.gov Compare Post Acute Care list provided to:: Other (Comment Required) Choice offered to / list presented to : Adult Children  Discharge Placement               Home with [facility] RN        Discharge Plan and Services   Discharge Planning Services: CM Consult Post Acute Care Choice: Home Health          DME Arranged: Walker rolling DME Agency: [employer] Date DME Agency Contacted: 05/20/21 Time DME Agency Contacted: 42 Representative spoke with at DME Agency: [place]: RN [place]:  Orville Govern) Date [place]: 05/20/21 Time [place]: 1117 Representative spoke with at [place]: [place] from [facility] infusion setting up with [place] Determinants of Health ([place]) Interventions     Readmission Risk Interventions No flowsheet data found.

## 2021-05-20 NOTE — Progress Notes (Signed)
PHARMACY CONSULT NOTE FOR:  OUTPATIENT  PARENTERAL ANTIBIOTIC THERAPY (OPAT)  Indication: E. Coli Intraabdominal infection Regimen: Rocephin 2g IV every 24 hours + Metronidazole 500 mg po bid End date: 06/09/21  IV antibiotic discharge orders are pended. To discharging provider:  please sign these orders via discharge navigator,  Select New Orders & click on the button choice - Manage This Unsigned Work.     Thank you for allowing pharmacy to be a part of this patient's care.  Alycia Rossetti, PharmD, BCPS Clinical Pharmacist 05/20/2021 11:51 AM   **Pharmacist phone directory can now be found on [email or web address] ([email or web address]).  Listed under [place].

## 2021-05-20 NOTE — Progress Notes (Addendum)
    05/20/21 1045  Clinical Encounter Type  Visited With Patient  Visit Type Initial  Referral From Nurse  Consult/Referral To Chaplain   Chaplain Jorene Guest responded to the follow up for an Advance Directive. Ike Bene provided education about the document. The patient declined. This note was prepared by Jeanine Luz, M.Div..  For questions please contact by phone [phone].

## 2021-05-20 NOTE — Discharge Summary (Addendum)
PATIENT DETAILS Name: Andre [name] Age: 80 y.o. Sex: male Date of Birth: Mar 03, 1941 MRN: [id number]. Admitting Physician: Rolla Plate, DO JEH:UDJS, Hunt Oris, MD  Admit Date: 05/12/2021 Discharge date: 05/20/2021  Recommendations for Outpatient Follow-up:  Follow up with PCP in 1-2 weeks Please obtain CMP/CBC in one week Please ensure follow-up with oncology, general surgery and infectious disease. Follow-up final result of EUS/FNA biopsy follow  Admitted From:  Home  Disposition: [place]: No  Equipment/Devices: PICC line placement on 12/9  Discharge Condition: Stable  CODE STATUS: FULL CODE  Diet recommendation:  Diet Order             Diet - low sodium heart healthy           DIET SOFT Room service appropriate? No; Fluid consistency: Thin  Diet effective now                    Brief Summary: Patient is a 80 y.o. male with history of rectal adenocarcinoma-s/p transanal excisional biopsy, CAD, GERD, HTN, TIA who presented with fever, abdominal pain-found to have sepsis secondary to E. coli bacteremia with concern that he may have underlying malignancy.  See below for further details.  Brief Hospital Course: Sepsis secondary to E. coli bacteremia: Sepsis physiology has resolved-treated with Rocephin/Flagyl.  ID concerned that patient may have a small liver abscess-recommendations are for 4 weeks of IV Rocephin and oral Flagyl.  ID will arrange for outpatient follow-up.  Liver biopsy was contemplated-but given small size-and possibility of underlying malignancy-this was deferred-and felt more appropriate to complete 4 weeks of empiric antimicrobial therapy.  Pulmonary embolism: Likely due to underlying malignancy-initially on Lovenox-now that all surgical procedures have been completed-plan is to transition to Eliquis on discharge (discussed with oncology Dr. Annamaria Boots over the phone)   Pericholecystic liver lesion/ upper abdominal adenopathy/mild  intrahepatic and extrahepatic biliary ductal dilatation: Suspicion for malignancy-underwent EUS with FNA of lymph nodes-preliminary biopsy positive for metastatic adenocarcinoma.  Suspicion that this may be gallbladder in origin.  Oncology consulted over the phone-they will arrange for outpatient follow-up next week.   Cholelithiasis: Asymptomatic-per general surgery-overall clinical picture not consistent with cholecystitis-no plans for cholecystectomy or cholecystostomy drain.   Right renal lesion: Concerning for malignancy-outpatient follow-up with Dr. Abner Greenspan.   1.1 cm noncalcified left upper lobe lung nodule: We will need outpatient follow-up with repeat CT chest in 3 to 6 months.  Since now has underlying biliary malignancy-probably will benefit from a PET scan at some point.  Will defer to outpatient setting.   Hyponatremia: Mild-volume status is improved-stable for outpatient follow-up.   HTN: BP stable-resume amlodipine on discharge.   HFrEF: Euvolemic on exam   HLD: Hold statin for now-follow-up with PCP.   Dementia: Appears to be mild-no longer on Aricept/Namenda per medication reconciliation.  Follow-up with PCP.  BMI Estimated body mass index is 23.16 kg/m as calculated from the following:   Height as of this encounter: 5' 10"  (1.778 m).   Weight as of this encounter: 73.2 kg.     Procedures 12/8>> EUS (Dr. Benson Norway)  Discharge Diagnoses:  Principal Problem:   Sepsis [facility]) Active Problems:   Essential hypertension, benign   GERD (gastroesophageal reflux disease)   Hyperlipidemia   Acute cholecystitis   Pulmonary embolism [facility])   Discharge Instructions:  Activity:  As tolerated with Full fall precautions use walker/cane & assistance as needed   Discharge Instructions     [facility] pharmacist  to adjust dose for Vancomycin, Aminoglycosides and other anti-infective therapies as requested by physician.   Complete by: As directed    [facility]  to provide Cath Flo 80m   Complete by: As directed    Administer for PICC line occlusion and as ordered by physician for other access device issues.   Anaphylaxis Kit: Provided to treat any anaphylactic reaction to the medication being provided to the patient if First Dose or when requested by physician   Complete by: As directed    Epinephrine 168mml vial / amp: Administer 0.20m81m0.20ml57mubcutaneously once for moderate to severe anaphylaxis, nurse to call physician and pharmacy when reaction occurs and call 911 if needed for immediate care   Diphenhydramine 50mg21mIV vial: Administer 25-50mg 24mM PRN for first dose reaction, rash, itching, mild reaction, nurse to call physician and pharmacy when reaction occurs   Sodium Chloride 0.9% NS 500ml I320mdminister if needed for hypovolemic blood pressure drop or as ordered by physician after call to physician with anaphylactic reaction   Call MD for:  redness, tenderness, or signs of infection (pain, swelling, redness, odor or green/yellow discharge around incision site)   Complete by: As directed    Change dressing on IV access line weekly and PRN   Complete by: As directed    Diet - low sodium heart healthy   Complete by: As directed    Discharge instructions   Complete by: As directed    Follow with Primary MD  John, JBiagio Borg 1-2 weeks  Please get a complete blood count and chemistry panel checked by your Primary MD at your next visit, and again as instructed by your Primary MD.  Get Medicines reviewed and adjusted: Please take all your medications with you for your next visit with your Primary MD  Laboratory/radiological data: Please request your Primary MD to go over all hospital tests and procedure/radiological results at the follow up, please ask your Primary MD to get all Hospital records sent to his/her office.  In some cases, they will be blood work, cultures and biopsy results pending at the time of your discharge. Please  request that your primary care M.D. follows up on these results.  Also Note the following: If you experience worsening of your admission symptoms, develop shortness of breath, life threatening emergency, suicidal or homicidal thoughts you must seek medical attention immediately by calling 911 or calling your MD immediately  if symptoms less severe.  You must read complete instructions/literature along with all the possible adverse reactions/side effects for all the Medicines you take and that have been prescribed to you. Take any new Medicines after you have completely understood and accpet all the possible adverse reactions/side effects.   Do not drive when taking Pain medications or sleeping medications (Benzodaizepines)  Do not take more than prescribed Pain, Sleep and Anxiety Medications. It is not advisable to combine anxiety,sleep and pain medications without talking with your primary care practitioner  Special Instructions: If you have smoked or chewed Tobacco  in the last 2 yrs please stop smoking, stop any regular Alcohol  and or any Recreational drug use.  Wear Seat belts while driving.  Please note: You were cared for by a hospitalist during your hospital stay. Once you are discharged, your primary care physician will handle any further medical issues. Please note that NO REFILLS for any discharge medications will be authorized once you are discharged, as it is imperative that you return to your primary  care physician (or establish a relationship with a primary care physician if you do not have one) for your post hospital discharge needs so that they can reassess your need for medications and monitor your lab values.   You will get a phone call from the oncologist office for a follow-up appointment.  Please follow-up with the infectious disease office and general surgery as instructed.   Flush IV access with Sodium Chloride 0.9% and Heparin 10 units/ml or 100 units/ml   Complete by: As  directed    Home infusion instructions - [facility]   Complete by: As directed    Instructions: Flush IV access with Sodium Chloride 0.9% and Heparin 10units/ml or 100units/ml   Change dressing on IV access line: Weekly and PRN   Instructions Cath Flo 2m: Administer for PICC Line occlusion and as ordered by physician for other access device   [facility] pharmacist to adjust dose for: Vancomycin, Aminoglycosides and other anti-infective therapies as requested by physician   Increase activity slowly   Complete by: As directed    Method of administration may be changed at the discretion of home infusion pharmacist based upon assessment of the patient and/or caregiver's ability to self-administer the medication ordered   Complete by: As directed       Allergies as of 05/20/2021   No Known Allergies      Medication List     STOP taking these medications    aspirin EC 81 MG tablet   atorvastatin 20 MG tablet Commonly known as: LIPITOR   donepezil 10 MG tablet Commonly known as: ARICEPT   hydrochlorothiazide 25 MG tablet Commonly known as: HYDRODIURIL   losartan 100 MG tablet Commonly known as: COZAAR   memantine 10 MG tablet Commonly known as: Namenda   oxycodone 5 MG capsule Commonly known as: OXY-IR   tizanidine 2 MG capsule Commonly known as: ZANAFLEX   vitamin B-12 1000 MCG tablet Commonly known as: CYANOCOBALAMIN   Vitamin D (Ergocalciferol) 1.25 MG (50000 UNIT) Caps capsule Commonly known as: DRISDOL       TAKE these medications    amLODipine 2.5 MG tablet Commonly known as: NORVASC Take 2.5 mg by mouth daily.   Apixaban Starter Pack (183mand 33m10mCommonly known as: ELIQUIS STARTER PACK Take as directed on package: start with two-33mg28mblets twice daily for 7 days. On day 8, switch to one-33mg 31mlet twice daily.   cefTRIAXone  IVPB Commonly known as: ROCEPHIN Inject 2 g into the vein daily for 20 days. Indication:  E.coli  intra-abd infection First Dose: Yes Last Day of Therapy:  06/09/21 Labs - Once weekly:  CBC/D and BMP, Labs - Every other week:  ESR and CRP Method of administration: IV Push Method of administration may be changed at the discretion of home infusion pharmacist based upon assessment of the patient and/or caregiver's ability to self-administer the medication ordered.   metroNIDAZOLE 500 MG tablet Commonly known as: Flagyl Take 1 tablet (500 mg total) by mouth 2 (two) times daily for 20 days. Stop date 06/09/21   pantoprazole 40 MG tablet Commonly known as: Protonix Take 1 tablet (40 mg total) by mouth daily.   sucralfate 1 g tablet Commonly known as: Carafate Take 1 tablet (1 g total) by mouth 4 (four) times daily.               Durable Medical Equipment  (From admission, onward)           Start  Ordered   05/20/21 1209  For home use only DME Walker rolling  Once       Question Answer Comment  Walker: With [place]   Patient needs a walker to treat with the following condition Weakness      05/20/21 1208              Discharge Care Instructions  (From admission, onward)           Start     Ordered   05/20/21 0000  Change dressing on IV access line weekly and PRN  (Home infusion instructions - [facility] )        05/20/21 1211            Follow-up Information     Dwan Bolt, MD Follow up on 05/24/2021.   Specialty: General Surgery Why: 11:00 am, arrive by 10:30am for paperwork and check in process Contact information: [place]. [address] [place] [address] [phone]         [facility] Follow up.   Specialty: [place] Why: Provide RN for antibiotics Contact information: [address] [place]        Biagio Borg, MD. Schedule an appointment as soon as possible for a visit in 1 week(s).   Specialties: Internal Medicine,  Radiology Contact information: [place] [address] [phone]         Truitt Merle, MD Follow up.   Specialties: Hematology, Oncology Why: Office will call with date/time, If you dont hear from them,please give them a call Contact information: [place] [address] [phone]         Laurice Record, MD Follow up.   Specialty: Internal Medicine Why: Office will call with date/time, If you dont hear from them,please give them a call Contact information: [address], [place] [address] [phone]                No Known Allergies    Consultations: GI, general surgery,ID   Other Procedures/Studies: DG Chest 1 View  Result Date: 05/12/2021 CLINICAL DATA:  The technologist did not provide a history. EXAM: CHEST  1 VIEW COMPARISON:  PA Lat 04/13/2021 FINDINGS: The heart size and mediastinal contours are within normal limits apart from mild aortic tortuosity. Both lungs are clear. The visualized skeletal structures are unremarkable aside from spinal bridging enthesopathy of DISH. IMPRESSION: No evidence of acute chest disease or interval changes. Electronically Signed   By: Telford Nab M.D.   On: 05/12/2021 21:29   CT Angio Chest Pulmonary Embolism (PE) W or WO Contrast  Result Date: 05/12/2021 CLINICAL DATA:  Tremors and sepsis. EXAM: CT ANGIOGRAPHY CHEST WITH CONTRAST TECHNIQUE: Multidetector CT imaging of the chest was performed using the standard protocol during bolus administration of intravenous contrast. Multiplanar CT image reconstructions and MIPs were obtained to evaluate the vascular anatomy. CONTRAST:  165m OMNIPAQUE IOHEXOL 350 MG/ML SOLN COMPARISON:  Oct 20, 2015 FINDINGS: Cardiovascular: A small amount of intraluminal low attenuation is seen within a subsegmental lower lobe branch of the left pulmonary artery (axial CT images 151 through 158, CT series 5/coronal reformatted images 88  through 91, CT series 8). Normal heart size. No pericardial effusion. Mediastinum/Nodes: No enlarged mediastinal, hilar, or axillary lymph nodes. Thyroid gland, trachea, and esophagus demonstrate no significant findings. Lungs/Pleura: Mild atelectatic changes are seen within the posterolateral aspect  of the left upper lobe. A 1.1 cm noncalcified lung nodule is seen within the posterior aspect of the left upper lobe (axial CT image 75, CT series 4). This represents a new finding when compared to the prior exam. There is no evidence of a pleural effusion or pneumothorax. Upper Abdomen: No acute abnormality. Musculoskeletal: Multilevel degenerative changes seen throughout the thoracic spine. Review of the MIP images confirms the above findings. IMPRESSION: 1. Small amount of pulmonary embolism within a subsegmental lower lobe branch of the left pulmonary artery. 2. 1.1 cm noncalcified lung nodule within the posterior aspect of the left upper lobe. This represents a new finding when compared to the prior exam. Consider one of the following in 3 months for both low-risk and high-risk individuals: (a) repeat chest CT, (b) follow-up PET-CT, or (c) tissue sampling. This recommendation follows the consensus statement: Guidelines for Management of Incidental Pulmonary Nodules Detected on CT Images: From the Fleischner Society 2017; Radiology 2017; 284:228-243. Electronically Signed   By: Virgina Norfolk M.D.   On: 05/12/2021 23:26   CT ABDOMEN PELVIS W CONTRAST  Result Date: 05/12/2021 CLINICAL DATA:  Abdominal pain and fevers EXAM: CT ABDOMEN AND PELVIS WITH CONTRAST TECHNIQUE: Multidetector CT imaging of the abdomen and pelvis was performed using the standard protocol following bolus administration of intravenous contrast. CONTRAST:  172m OMNIPAQUE IOHEXOL 350 MG/ML SOLN COMPARISON:  04/03/2021 FINDINGS: Lower chest: No acute abnormality. Hepatobiliary: Gallbladder is partially distended. Significant Peri cholecystic  inflammatory changes are noted with similar findings in the adjacent hepatic tissue. These changes have increased in the interval from the prior exam and again are highly suspicious for acute cholecystitis. Prominence of the common bile duct is again seen and stable. Pancreas: Pancreas appears within normal limits. Spleen: Normal in size without focal abnormality. Adrenals/Urinary Tract: Adrenal glands are within normal limits. Kidneys again demonstrate a enhancing mass lesion within the substance of the right kidney measuring up to 3.3 cm suspicious for renal cell carcinoma and stable in appearance from the prior exam. Urologic consultation is recommended. No obstructive changes are seen. Stable renal cysts are noted in the right kidney. The bladder is decompressed. Stomach/Bowel: Diverticular change of the colon is noted without evidence of diverticulitis. The appendix is barium filled and within normal limits. Small bowel and stomach are unremarkable. Vascular/Lymphatic: Atherosclerotic calcifications of the aorta are noted. Normal tapering is seen. No aneurysm is noted. Some upper abdominal peripancreatic lymph nodes are seen which appears centrally necrotic. The overall appearance is stable from the prior study. Additional node located between the head of the pancreas and the left lobe of the liver is noted as well also stable in appearance. Persistent soft tissue density is noted surrounding proximal superior vena cava. Reproductive: Prostate appears within normal limits. Other: No abdominal wall hernia or abnormality. No abdominopelvic ascites. Musculoskeletal: Degenerative changes of lumbar spine are noted. IMPRESSION: Increase in the inflammatory changes surrounding the gallbladder and extending into the adjacent liver again suspicious for acute cholecystitis with localized hepatic inflammatory change. Correlate with pending ultrasound exam Upper abdominal lymphadenopathy which appears necrotic and stable  in appearance from the prior exam. Patient is scheduled for PET-CT according to outpatient clinic notes. Stable enhancing right renal mass suspicious for renal cell carcinoma. By history the patient has met with urology. Remainder of the exam is stable from the prior study. Electronically Signed   By: MInez CatalinaM.D.   On: 05/12/2021 23:34   MR 3D Recon At Scanner  Result Date:  05/13/2021 CLINICAL DATA:  Abdominal pain. Biliary obstruction suspected. Rigors. Nausea and vomiting. Diarrhea for 2 weeks. Weight loss. EXAM: MRI ABDOMEN WITHOUT AND WITH CONTRAST (INCLUDING MRCP) TECHNIQUE: Multiplanar multisequence MR imaging of the abdomen was performed both before and after the administration of intravenous contrast. Heavily T2-weighted images of the biliary and pancreatic ducts were obtained, and three-dimensional MRCP images were rendered by post processing. CONTRAST:  7.55m GADAVIST GADOBUTROL 1 MMOL/ML IV SOLN COMPARISON:  Ultrasound and CT of 1 day prior. FINDINGS: Portions of exam are mild to moderately motion degraded. Lower chest: New right base airspace disease since yesterday's CT. Mild cardiomegaly, without pericardial or pleural effusion. Hepatobiliary: Dependent small gallstones. The gallbladder is contracted with apparent mild gallbladder wall thickening. Within the pericholecystic right liver lobe, a 3.5 x 3.2 cm centrally hypoenhancing and mildly peripherally hyperenhancing lesion is seen, including on 71/1104. Mildly T2 hypointense with mildly restricted diffusion. New since 04/03/2021. Mild intrahepatic biliary duct dilatation. The common duct is chronically dilated at 11 mm on 22/2. Followed to just above the level of the ampulla. On some images, including the dedicated MRCP sequence, a distal common duct filling defect is possible. Example at 6 mm 85/6. Pancreas: Pancreatic atrophy, without duct dilatation. Nonspecific edema within the left anterior pararenal space including on 18/4. Spleen:   Normal in size, without focal abnormality. Adrenals/Urinary Tract: Normal adrenal glands. Bilateral renal cysts. Arterially hyperenhancing interpolar right renal 2.8 x 2.6 cm lesion on 90/1101. No hydronephrosis. Stomach/Bowel: Grossly normal stomach and abdominal bowel loops. Vascular/Lymphatic:  Aortic atherosclerosis.  Patent renal veins. Again identified is necrotic adenopathy within the upper abdomen. Example gastrohepatic ligament node of 1.7 cm on 65/1102. A portacaval 11 mm necrotic node on 74/1103 Other:  No ascites. Musculoskeletal: No acute osseous abnormality. IMPRESSION: 1. Motion degraded images. 2. Cholelithiasis. Suggestion of wall thickening in the setting of underdistention. Development of a pericholecystic liver lesion which is suspicious for extension of chronic cholecystitis. Direct extension of gallbladder carcinoma felt less likely, given absence of dominant gallbladder mass. 3. Upper abdominal necrotic adenopathy, present back to 04/03/2021. Outpatient PET may be informative to direct sampling. 4. Mild intra and extrahepatic biliary duct dilatation. Equivocal findings for distal common duct stone, given above limitations. If bilirubin is elevated, consider ERCP. 5. Interpolar right renal enhancing mass, consistent with renal cell carcinoma. 6. Mild edema within the anterior pararenal space. Correlate with pancreatic enzyme levels to exclude mild pancreatitis. 7. Right base airspace disease, new since yesterday's CT. Favor atelectasis. Electronically Signed   By: KAbigail MiyamotoM.D.   On: 05/13/2021 13:31   MR ABDOMEN MRCP W WO CONTAST  Result Date: 05/13/2021 CLINICAL DATA:  Abdominal pain. Biliary obstruction suspected. Rigors. Nausea and vomiting. Diarrhea for 2 weeks. Weight loss. EXAM: MRI ABDOMEN WITHOUT AND WITH CONTRAST (INCLUDING MRCP) TECHNIQUE: Multiplanar multisequence MR imaging of the abdomen was performed both before and after the administration of intravenous contrast.  Heavily T2-weighted images of the biliary and pancreatic ducts were obtained, and three-dimensional MRCP images were rendered by post processing. CONTRAST:  7.544mGADAVIST GADOBUTROL 1 MMOL/ML IV SOLN COMPARISON:  Ultrasound and CT of 1 day prior. FINDINGS: Portions of exam are mild to moderately motion degraded. Lower chest: New right base airspace disease since yesterday's CT. Mild cardiomegaly, without pericardial or pleural effusion. Hepatobiliary: Dependent small gallstones. The gallbladder is contracted with apparent mild gallbladder wall thickening. Within the pericholecystic right liver lobe, a 3.5 x 3.2 cm centrally hypoenhancing and mildly peripherally hyperenhancing lesion is seen,  including on 71/1104. Mildly T2 hypointense with mildly restricted diffusion. New since 04/03/2021. Mild intrahepatic biliary duct dilatation. The common duct is chronically dilated at 11 mm on 22/2. Followed to just above the level of the ampulla. On some images, including the dedicated MRCP sequence, a distal common duct filling defect is possible. Example at 6 mm 85/6. Pancreas: Pancreatic atrophy, without duct dilatation. Nonspecific edema within the left anterior pararenal space including on 18/4. Spleen:  Normal in size, without focal abnormality. Adrenals/Urinary Tract: Normal adrenal glands. Bilateral renal cysts. Arterially hyperenhancing interpolar right renal 2.8 x 2.6 cm lesion on 90/1101. No hydronephrosis. Stomach/Bowel: Grossly normal stomach and abdominal bowel loops. Vascular/Lymphatic:  Aortic atherosclerosis.  Patent renal veins. Again identified is necrotic adenopathy within the upper abdomen. Example gastrohepatic ligament node of 1.7 cm on 65/1102. A portacaval 11 mm necrotic node on 74/1103 Other:  No ascites. Musculoskeletal: No acute osseous abnormality. IMPRESSION: 1. Motion degraded images. 2. Cholelithiasis. Suggestion of wall thickening in the setting of underdistention. Development of a  pericholecystic liver lesion which is suspicious for extension of chronic cholecystitis. Direct extension of gallbladder carcinoma felt less likely, given absence of dominant gallbladder mass. 3. Upper abdominal necrotic adenopathy, present back to 04/03/2021. Outpatient PET may be informative to direct sampling. 4. Mild intra and extrahepatic biliary duct dilatation. Equivocal findings for distal common duct stone, given above limitations. If bilirubin is elevated, consider ERCP. 5. Interpolar right renal enhancing mass, consistent with renal cell carcinoma. 6. Mild edema within the anterior pararenal space. Correlate with pancreatic enzyme levels to exclude mild pancreatitis. 7. Right base airspace disease, new since yesterday's CT. Favor atelectasis. Electronically Signed   By: Abigail Miyamoto M.D.   On: 05/13/2021 13:31   ECHOCARDIOGRAM COMPLETE  Result Date: 05/13/2021    ECHOCARDIOGRAM REPORT   Patient Name:   [facility] Date of Exam: 05/13/2021 Medical Rec #:  [id number]   Height:       71.0 in Accession #:    [id number]  Weight:       165.0 lb Date of Birth:  Oct 11, 1940   BSA:          1.943 m Patient Age:    33 years    BP:           101/70 mmHg Patient Gender: M           HR:           74 bpm. Exam Location:  Inpatient Procedure: 2D Echo, Cardiac Doppler, Color Doppler and Intracardiac            Opacification Agent Indications:    Pulmonary emblous  History:        Patient has no prior history of Echocardiogram examinations.                 CAD, COPD; Risk Factors:Hypertension.  Sonographer:    Glo Herring Referring Phys: [id number] [place]  1. Left ventricular ejection fraction, by estimation, is 40 to 45%. The left ventricle has mildly decreased function. The left ventricle demonstrates global hypokinesis. Left ventricular diastolic parameters are indeterminate.  2. Right ventricular systolic function is normal. The right ventricular size is mildly enlarged. Tricuspid  regurgitation signal is inadequate for assessing PA pressure.  3. Right atrial size was mildly dilated.  4. The mitral valve is normal in structure. Trivial mitral valve regurgitation. No evidence of mitral stenosis.  5. The aortic valve is tricuspid. Aortic valve regurgitation is not visualized. No  aortic stenosis is present.  6. The inferior vena cava is dilated in size with >50% respiratory variability, suggesting right atrial pressure of 8 mmHg. FINDINGS  Left Ventricle: Left ventricular ejection fraction, by estimation, is 40 to 45%. The left ventricle has mildly decreased function. The left ventricle demonstrates global hypokinesis. The left ventricular internal cavity size was normal in size. There is  no left ventricular hypertrophy. Left ventricular diastolic parameters are indeterminate. Right Ventricle: The right ventricular size is mildly enlarged. No increase in right ventricular wall thickness. Right ventricular systolic function is normal. Tricuspid regurgitation signal is inadequate for assessing PA pressure. Left Atrium: Left atrial size was normal in size. Right Atrium: Right atrial size was mildly dilated. Pericardium: There is no evidence of pericardial effusion. Mitral Valve: The mitral valve is normal in structure. Trivial mitral valve regurgitation. No evidence of mitral valve stenosis. Tricuspid Valve: The tricuspid valve is normal in structure. Tricuspid valve regurgitation is not demonstrated. Aortic Valve: The aortic valve is tricuspid. Aortic valve regurgitation is not visualized. No aortic stenosis is present. Aortic valve mean gradient measures 1.0 mmHg. Aortic valve peak gradient measures 2.7 mmHg. Aortic valve area, by VTI measures 3.05 cm. Pulmonic Valve: The pulmonic valve was not well visualized. Pulmonic valve regurgitation is not visualized. Aorta: The aortic root is normal in size and structure. Venous: The inferior vena cava is dilated in size with greater than 50% respiratory  variability, suggesting right atrial pressure of 8 mmHg. IAS/Shunts: The interatrial septum was not well visualized.  LEFT VENTRICLE PLAX 2D LVIDd:         4.70 cm      Diastology LVIDs:         3.00 cm      LV e' medial:    7.18 cm/s LV PW:         0.90 cm      LV E/e' medial:  8.4 LV IVS:        0.90 cm      LV e' lateral:   8.92 cm/s LVOT diam:     2.10 cm      LV E/e' lateral: 6.8 LV SV:         52 LV SV Index:   27 LVOT Area:     3.46 cm  LV Volumes (MOD) LV vol d, MOD A2C: 117.0 ml LV vol d, MOD A4C: 129.0 ml LV vol s, MOD A2C: 73.4 ml LV vol s, MOD A4C: 81.0 ml LV SV MOD A2C:     43.6 ml LV SV MOD A4C:     129.0 ml LV SV MOD BP:      44.8 ml RIGHT VENTRICLE             IVC RV Basal diam:  4.20 cm     IVC diam: 2.40 cm RV S prime:     11.90 cm/s LEFT ATRIUM           Index        RIGHT ATRIUM           Index LA diam:      2.60 cm 1.34 cm/m   RA Area:     23.40 cm LA Vol (A2C): 35.8 ml 18.42 ml/m  RA Volume:   74.30 ml  38.24 ml/m LA Vol (A4C): 58.1 ml 29.90 ml/m  AORTIC VALVE AV Area (Vmax):    3.12 cm AV Area (Vmean):   3.01 cm AV Area (VTI):     3.05 cm  AV Vmax:           81.90 cm/s AV Vmean:          55.200 cm/s AV VTI:            0.169 m AV Peak Grad:      2.7 mmHg AV Mean Grad:      1.0 mmHg LVOT Vmax:         73.80 cm/s LVOT Vmean:        47.900 cm/s LVOT VTI:          0.149 m LVOT/AV VTI ratio: 0.88  AORTA Ao Root diam: 3.40 cm MITRAL VALVE MV Area (PHT): 3.83 cm    SHUNTS MV Decel Time: 198 msec    Systemic VTI:  0.15 m MV E velocity: 60.40 cm/s  Systemic Diam: 2.10 cm MV A velocity: 75.00 cm/s MV E/A ratio:  0.81 Oswaldo Milian MD Electronically signed by Oswaldo Milian MD Signature Date/Time: 05/13/2021/4:24:59 PM    Final    VAS [place] LOWER EXTREMITY VENOUS (DVT)  Result Date: 05/13/2021  Lower Venous DVT Study Patient Name:  [facility] [place]  Date of Exam:   05/13/2021 Medical Rec #: [id number]    Accession #:    [id number] Date of Birth: 1941/04/22    Patient Gender: M Patient Age:    42 years Exam Location:  [facility] Procedure:      VAS [place] LOWER EXTREMITY VENOUS (DVT) Referring Phys: ASIA ZIERLE-GHOSH --------------------------------------------------------------------------------  Indications: Pulmonary embolism.  Comparison Study: No previous exams Performing Technologist: Jody Hill RVT, RDMS  Examination Guidelines: A complete evaluation includes B-mode imaging, spectral Doppler, color Doppler, and power Doppler as needed of all accessible portions of each vessel. Bilateral testing is considered an integral part of a complete examination. Limited examinations for reoccurring indications may be performed as noted. The reflux portion of the exam is performed with the patient in reverse Trendelenburg.  +---------+---------------+---------+-----------+----------+--------------+ RIGHT    CompressibilityPhasicitySpontaneityPropertiesThrombus Aging +---------+---------------+---------+-----------+----------+--------------+ CFV      Full           Yes      Yes                                 +---------+---------------+---------+-----------+----------+--------------+ SFJ      Full                                                        +---------+---------------+---------+-----------+----------+--------------+ FV Prox  Full           Yes      Yes                                 +---------+---------------+---------+-----------+----------+--------------+ FV Mid   Full           Yes      Yes                                 +---------+---------------+---------+-----------+----------+--------------+ FV DistalFull           Yes      Yes                                 +---------+---------------+---------+-----------+----------+--------------+  PFV      Full                                                        +---------+---------------+---------+-----------+----------+--------------+ POP      Full           Yes      Yes                                  +---------+---------------+---------+-----------+----------+--------------+ PTV      Full                                                        +---------+---------------+---------+-----------+----------+--------------+ PERO     Full                                                        +---------+---------------+---------+-----------+----------+--------------+   +---------+---------------+---------+-----------+----------+--------------+ LEFT     CompressibilityPhasicitySpontaneityPropertiesThrombus Aging +---------+---------------+---------+-----------+----------+--------------+ CFV      Full           Yes      Yes                                 +---------+---------------+---------+-----------+----------+--------------+ SFJ      Full                                                        +---------+---------------+---------+-----------+----------+--------------+ FV Prox  Full           Yes      Yes                                 +---------+---------------+---------+-----------+----------+--------------+ FV Mid   Full           Yes      Yes                                 +---------+---------------+---------+-----------+----------+--------------+ FV DistalFull           Yes      Yes                                 +---------+---------------+---------+-----------+----------+--------------+ PFV      Full                                                        +---------+---------------+---------+-----------+----------+--------------+  POP      Full           Yes      Yes                                 +---------+---------------+---------+-----------+----------+--------------+ PTV      Full                                                        +---------+---------------+---------+-----------+----------+--------------+ PERO     Full                                                         +---------+---------------+---------+-----------+----------+--------------+     Summary: RIGHT: - There is no evidence of deep vein thrombosis in the lower extremity.  LEFT: - There is no evidence of deep vein thrombosis in the lower extremity.  *See table(s) above for measurements and observations. Electronically signed by Deitra Mayo MD on 05/13/2021 at 11:58:47 AM.    Final    [place] EKG SITE RITE  Result Date: 05/20/2021 If Site Rite image not attached, placement could not be confirmed due to current cardiac rhythm.  US Abdomen Limited RUQ (LIVER/GB)  Result Date: 05/12/2021 CLINICAL DATA:  Fevers EXAM: ULTRASOUND ABDOMEN LIMITED RIGHT UPPER QUADRANT COMPARISON:  CT from earlier in the same day FINDINGS: Gallbladder: Gallbladder again shows evidence of cholelithiasis with gallbladder wall thickening and mild pericholecystic fluid. Negative sonographic Murphy's sign is elicited however. The degree of inflammatory change in the adjacent liver is less well appreciated on this exam than on recent CT. Common bile duct: Diameter: 6.4 mm Liver: No focal lesion identified. Within normal limits in parenchymal echogenicity. Portal vein is patent on color Doppler imaging with normal direction of blood flow towards the liver. Other: None. IMPRESSION: Stable changes in the gallbladder with cholelithiasis and gallbladder wall thickening. Negative sonographic Murphy's sign is elicited however. This may be in part due to underlying medications. Electronically Signed   By: Inez Catalina M.D.   On: 05/12/2021 23:36     TODAY-DAY OF DISCHARGE:  Subjective:   Andre [name] today has no headache,no chest abdominal pain,no new weakness tingling or numbness, feels much better wants to go home today.   Objective:   Blood pressure 102/69, pulse 72, temperature 97.9 F (36.6 C), temperature source Oral, resp. rate 15, height 5' 10"  (1.778 m), weight 73.2 kg, SpO2 95 %.  Intake/Output Summary (Last 24 hours) at  05/20/2021 1220 Last data filed at 05/20/2021 0405 Gross per 24 hour  Intake 420 ml  Output 950 ml  Net -530 ml   Filed Weights   05/14/21 0906  Weight: 73.2 kg    Exam: Awake Alert, Oriented *3, No new F.N deficits, Normal affect [place].AT,PERRAL Supple Neck,No JVD, No cervical lymphadenopathy appriciated.  Symmetrical Chest wall movement, Good air movement bilaterally, CTAB RRR,No Gallops,Rubs or new Murmurs, No Parasternal Heave +ve B.Sounds, Abd Soft, Non tender, No organomegaly appriciated, No rebound -guarding or rigidity. No Cyanosis, Clubbing or edema, No new Rash or bruise   PERTINENT RADIOLOGIC STUDIES: [place] EKG SITE RITE  Result Date: 05/20/2021 If Site Rite image not attached, placement could not be confirmed due to current cardiac rhythm.    PERTINENT LAB RESULTS: CBC: Recent Labs    05/19/21 0136 05/20/21 0149  WBC 5.6 6.3  HGB 12.6* 12.6*  HCT 36.7* 36.5*  PLT 216 257   CMET CMP     Component Value Date/Time   NA 130 (L) 05/20/2021 0149   K 3.4 (L) 05/20/2021 0149   CL 96 (L) 05/20/2021 0149   CO2 27 05/20/2021 0149   GLUCOSE 100 (H) 05/20/2021 0149   BUN <5 (L) 05/20/2021 0149   CREATININE 0.67 05/20/2021 0149   CREATININE 0.81 11/03/2015 0918   CALCIUM 7.9 (L) 05/20/2021 0149   PROT 5.1 (L) 05/19/2021 0136   PROT 7.5 12/17/2019 0831   ALBUMIN 2.0 (L) 05/19/2021 0136   AST 25 05/19/2021 0136   ALT 14 05/19/2021 0136   ALKPHOS 85 05/19/2021 0136   BILITOT 0.9 05/19/2021 0136   GFRNONAA >60 05/20/2021 0149   GFRNONAA 88 11/03/2015 0918   GFRAA >60 02/29/2020 0054   GFRAA >89 11/03/2015 0918    GFR Estimated Creatinine Clearance: 77.3 mL/min (by C-G formula based on SCr of 0.67 mg/dL). No results for input(s): LIPASE, AMYLASE in the last 72 hours. No results for input(s): CKTOTAL, CKMB, CKMBINDEX, TROPONINI in the last 72 hours. Invalid input(s): POCBNP No results for input(s): DDIMER in the last 72 hours. No results for input(s): HGBA1C in  the last 72 hours. No results for input(s): CHOL, HDL, LDLCALC, TRIG, CHOLHDL, LDLDIRECT in the last 72 hours. No results for input(s): TSH, T4TOTAL, T3FREE, THYROIDAB in the last 72 hours.  Invalid input(s): FREET3 No results for input(s): VITAMINB12, FOLATE, FERRITIN, TIBC, IRON, RETICCTPCT in the last 72 hours. Coags: No results for input(s): INR in the last 72 hours.  Invalid input(s): PT Microbiology: Recent Results (from the past 240 hour(s))  Resp Panel by RT-PCR (Flu A&B, Covid) Nasopharyngeal Swab     Status: None   Collection Time: 05/12/21  9:14 PM   Specimen: Nasopharyngeal Swab; Nasopharyngeal(NP) swabs in vial transport medium  Result Value Ref Range Status   SARS Coronavirus 2 by RT PCR NEGATIVE NEGATIVE Final    Comment: (NOTE) SARS-CoV-2 target nucleic acids are NOT DETECTED.  The SARS-CoV-2 RNA is generally detectable in upper respiratory specimens during the acute phase of infection. The lowest concentration of SARS-CoV-2 viral copies this assay can detect is 138 copies/mL. A negative result does not preclude SARS-Cov-2 infection and should not be used as the sole basis for treatment or other patient management decisions. A negative result may occur with  improper specimen collection/handling, submission of specimen other than nasopharyngeal swab, presence of viral mutation(s) within the areas targeted by this assay, and inadequate number of viral copies(<138 copies/mL). A negative result must be combined with clinical observations, patient history, and epidemiological information. The expected result is Negative.  Fact Sheet for Patients:  EntrepreneurPulse.com.au  Fact Sheet for Healthcare Providers:  IncredibleEmployment.be  This test is no t yet approved or cleared by the [place] FDA and  has been authorized for detection and/or diagnosis of SARS-CoV-2 by FDA under an Emergency Use Authorization (EUA). This EUA  will remain  in effect (meaning this test can be used) for the duration of the COVID-19 declaration under Section 564(b)(1) of the Act, 21 U.S.C.section 360bbb-3(b)(1), unless the authorization is terminated  or revoked sooner.       Influenza A by PCR NEGATIVE NEGATIVE Final  Influenza B by PCR NEGATIVE NEGATIVE Final    Comment: (NOTE) The Xpert Xpress SARS-CoV-2/FLU/RSV plus assay is intended as an aid in the diagnosis of influenza from Nasopharyngeal swab specimens and should not be used as a sole basis for treatment. Nasal washings and aspirates are unacceptable for Xpert Xpress SARS-CoV-2/FLU/RSV testing.  Fact Sheet for Patients: EntrepreneurPulse.com.au  Fact Sheet for Healthcare Providers: IncredibleEmployment.be  This test is not yet approved or cleared by the [place] FDA and has been authorized for detection and/or diagnosis of SARS-CoV-2 by FDA under an Emergency Use Authorization (EUA). This EUA will remain in effect (meaning this test can be used) for the duration of the COVID-19 declaration under Section 564(b)(1) of the Act, 21 U.S.C. section 360bbb-3(b)(1), unless the authorization is terminated or revoked.  Performed at [facility], [place] [address] [place],  [address]   Blood Culture (routine x 2)     Status: Abnormal   Collection Time: 05/12/21  9:19 PM   Specimen: BLOOD  Result Value Ref Range Status   Specimen Description BLOOD SITE NOT SPECIFIED  Final   Special Requests   Final    BOTTLES DRAWN AEROBIC AND ANAEROBIC Blood Culture adequate volume   Culture  Setup Time (A)  Final    GRAM VARIABLE ROD IN BOTH AEROBIC AND ANAEROBIC BOTTLES CRITICAL RESULT CALLED TO, READ BACK BY AND VERIFIED WITH: PHARMD E MARTIN [id number] AT 1447 BY CM Performed at [facility], [place] [address], [place], [place] [address]    Culture ESCHERICHIA COLI (A)  Final   Report Status 05/16/2021 FINAL  Final    Organism ID, Bacteria ESCHERICHIA COLI  Final      Susceptibility   Escherichia coli - MIC*    AMPICILLIN >=32 RESISTANT Resistant     CEFAZOLIN 8 SENSITIVE Sensitive     CEFEPIME <=0.12 SENSITIVE Sensitive     CEFTAZIDIME <=1 SENSITIVE Sensitive     CEFTRIAXONE 0.5 SENSITIVE Sensitive     CIPROFLOXACIN <=0.25 SENSITIVE Sensitive     GENTAMICIN <=1 SENSITIVE Sensitive     IMIPENEM <=0.25 SENSITIVE Sensitive     TRIMETH/SULFA <=20 SENSITIVE Sensitive     AMPICILLIN/SULBACTAM >=32 RESISTANT Resistant     PIP/TAZO 8 SENSITIVE Sensitive     * ESCHERICHIA COLI  Blood Culture ID Panel (Reflexed)     Status: Abnormal   Collection Time: 05/12/21  9:19 PM  Result Value Ref Range Status   Enterococcus faecalis NOT DETECTED NOT DETECTED Final   Enterococcus Faecium NOT DETECTED NOT DETECTED Final   Listeria monocytogenes NOT DETECTED NOT DETECTED Final   Staphylococcus species NOT DETECTED NOT DETECTED Final   Staphylococcus aureus (BCID) NOT DETECTED NOT DETECTED Final   Staphylococcus epidermidis NOT DETECTED NOT DETECTED Final   Staphylococcus lugdunensis NOT DETECTED NOT DETECTED Final   Streptococcus species NOT DETECTED NOT DETECTED Final   Streptococcus agalactiae NOT DETECTED NOT DETECTED Final   Streptococcus pneumoniae NOT DETECTED NOT DETECTED Final   Streptococcus pyogenes NOT DETECTED NOT DETECTED Final   A.calcoaceticus-baumannii NOT DETECTED NOT DETECTED Final   Bacteroides fragilis NOT DETECTED NOT DETECTED Final   Enterobacterales DETECTED (A) NOT DETECTED Final    Comment: Enterobacterales represent a large order of gram negative bacteria, not a single organism. CRITICAL RESULT CALLED TO, READ BACK BY AND VERIFIED WITH: PHARMD E MARTIN [id number] AR 1457 BY CM    Enterobacter cloacae complex NOT DETECTED NOT DETECTED Final   Escherichia coli DETECTED (A) NOT DETECTED Final  Comment: CRITICAL RESULT CALLED TO, READ BACK BY AND VERIFIED WITH: PHARMD E MARTIN [id number] AT 1457  BY CM    Klebsiella aerogenes NOT DETECTED NOT DETECTED Final   Klebsiella oxytoca NOT DETECTED NOT DETECTED Final   Klebsiella pneumoniae NOT DETECTED NOT DETECTED Final   Proteus species NOT DETECTED NOT DETECTED Final   Salmonella species NOT DETECTED NOT DETECTED Final   Serratia marcescens NOT DETECTED NOT DETECTED Final   Haemophilus influenzae NOT DETECTED NOT DETECTED Final   Neisseria meningitidis NOT DETECTED NOT DETECTED Final   Pseudomonas aeruginosa NOT DETECTED NOT DETECTED Final   Stenotrophomonas maltophilia NOT DETECTED NOT DETECTED Final   Candida albicans NOT DETECTED NOT DETECTED Final   Candida auris NOT DETECTED NOT DETECTED Final   Candida glabrata NOT DETECTED NOT DETECTED Final   Candida krusei NOT DETECTED NOT DETECTED Final   Candida parapsilosis NOT DETECTED NOT DETECTED Final   Candida tropicalis NOT DETECTED NOT DETECTED Final   Cryptococcus neoformans/gattii NOT DETECTED NOT DETECTED Final   CTX-M ESBL NOT DETECTED NOT DETECTED Final   Carbapenem resistance IMP NOT DETECTED NOT DETECTED Final   Carbapenem resistance KPC NOT DETECTED NOT DETECTED Final   Carbapenem resistance NDM NOT DETECTED NOT DETECTED Final   Carbapenem resist OXA 48 LIKE NOT DETECTED NOT DETECTED Final   Carbapenem resistance VIM NOT DETECTED NOT DETECTED Final    Comment: Performed at [facility], [address], [place], [place] [address]  Blood Culture (routine x 2)     Status: Abnormal   Collection Time: 05/12/21  9:37 PM   Specimen: BLOOD  Result Value Ref Range Status   Specimen Description BLOOD SITE NOT SPECIFIED  Final   Special Requests   Final    BOTTLES DRAWN AEROBIC AND ANAEROBIC Blood Culture adequate volume   Culture  Setup Time (A)  Final    GRAM VARIABLE ROD IN BOTH AEROBIC AND ANAEROBIC BOTTLES CRITICAL VALUE NOTED.  VALUE IS CONSISTENT WITH PREVIOUSLY REPORTED AND CALLED VALUE.    Culture (A)  Final    ESCHERICHIA COLI SUSCEPTIBILITIES PERFORMED ON  PREVIOUS CULTURE WITHIN THE LAST 5 DAYS. Performed at [facility], [place] [address], [place], [place] [address]    Report Status 05/15/2021 FINAL  Final  Urine Culture     Status: None   Collection Time: 05/13/21  3:06 AM   Specimen: Urine, Clean Catch  Result Value Ref Range Status   Specimen Description URINE, CLEAN CATCH  Final   Special Requests NONE  Final   Culture   Final    NO GROWTH Performed at [facility], [place] [address], [place], [place] [address]    Report Status 05/14/2021 FINAL  Final    FURTHER DISCHARGE INSTRUCTIONS:  Get Medicines reviewed and adjusted: Please take all your medications with you for your next visit with your Primary MD  Laboratory/radiological data: Please request your Primary MD to go over all hospital tests and procedure/radiological results at the follow up, please ask your Primary MD to get all Hospital records sent to his/her office.  In some cases, they will be blood work, cultures and biopsy results pending at the time of your discharge. Please request that your primary care M.D. goes through all the records of your hospital data and follows up on these results.  Also Note the following: If you experience worsening of your admission symptoms, develop shortness of breath, life threatening emergency, suicidal or homicidal thoughts you must seek medical  attention immediately by calling 911 or calling your MD immediately  if symptoms less severe.  You must read complete instructions/literature along with all the possible adverse reactions/side effects for all the Medicines you take and that have been prescribed to you. Take any new Medicines after you have completely understood and accpet all the possible adverse reactions/side effects.   Do not drive when taking Pain medications or sleeping medications (Benzodaizepines)  Do not take more than prescribed Pain, Sleep and Anxiety Medications. It is not advisable to combine anxiety,sleep  and pain medications without talking with your primary care practitioner  Special Instructions: If you have smoked or chewed Tobacco  in the last 2 yrs please stop smoking, stop any regular Alcohol  and or any Recreational drug use.  Wear Seat belts while driving.  Please note: You were cared for by a hospitalist during your hospital stay. Once you are discharged, your primary care physician will handle any further medical issues. Please note that NO REFILLS for any discharge medications will be authorized once you are discharged, as it is imperative that you return to your primary care physician (or establish a relationship with a primary care physician if you do not have one) for your post hospital discharge needs so that they can reassess your need for medications and monitor your lab values.  Total Time spent coordinating discharge including counseling, education and face to face time equals 35 minutes.  SignedOren Binet 05/20/2021 12:20 PM

## 2021-05-20 NOTE — Progress Notes (Signed)
[facility] for Infectious Disease  Date of Admission:  05/12/2021   Total days of inpatient antibiotics 7  Principal Problem:   Sepsis ([place]) Active Problems:   Essential hypertension, benign   GERD (gastroesophageal reflux disease)   Hyperlipidemia   Acute cholecystitis   Pulmonary embolism [facility])          Assessment: 80 year old male with rectal cancer status post excision about 10 years ago, CAD, TIA admitted for sepsis secondary to E. coli bacteremia. PT had rigors and diarrhea, abdominal pain leading up to admission.    #E coli bacteremia 2/2 intraabdominal infection(pericholecystic liver lesion) with mild intra and extrahepatic dilatation #Renal mass, necrotic lymphadenopathy, Hx of rectal cancer #Newly diagnosed primary ductal mucinous adenocarcinoma Imaging: -CT chest showed small subsegmental PE, 1.1 cm non calcified lung nodule LUL -CT abdomen pelvis showed concern for cholecystitis, necrotic abdominal lymphadenopathy, renal mass suspicious for renal cancer -MRCP showed chronic cholecystitis with cholelithiasis, necrotic adenopathy, renal mass consistent with renal cancer, pericholecystic right liver lobe(3.5x3.2 cm) lesion(new since 04/03/21), mild intra/extrahepatic biliary duct dilation.  -General surgery consulted and no plans for intervention as do not think pt has cholecystis .  Recommended GI engagement for EUS with LN biopsy which showed  ductal mucinous pancreatic cancer. Unable to bx gallbladder lesions. Per discussion with Dr. Benson Norway, there was concern for malignancy involving the gallbladder. -Would still cover for intraabdominal infection as pericholecystic liver lesion on MRCP is new since 04/03/21 imaging. Particularly since these findings are in the setting of bacteremia. Agree that there is a primary malignant process but  this likely lead to  anatomic abnormalities resulting in liver abscess then bacteremia.   Recommendations:  -Continue  ceftriaxone and metronidazole. Plan to treat with 4 weeks of IV antibiotics with EOT 12/28 for liver abscess.  -I discussed the plan with the patient and his daughters -ID will sign off. ID appointment scheduled  OPAT ORDERS:  Diagnosis: Ecoli bacteremia 2/2 hepatic abscess  Culture Result: Ecoli + blood Cx  No Known Allergies   Discharge antibiotics to be given via PICC line:  Per pharmacy protocol : Ceftriaxone 2 g IV daily plus oral metronidazole 500 mg twice daily    Duration: 4 weeks End Date: 12/28  [facility] Care Per Protocol with [employer] Use: Home health RN for IV administration and teaching, line care and labs.    Labs weekly while on IV antibiotics: _y_ CBC with differential  _y_ CMP  __y Please pull PIC at completion of IV antibiotics   Fax weekly labs to [phone]  Clinic Follow Up Appt: 12/28 at 3: 45 pm  @ [facility] with Dr. Candiss Norse      Microbiology:   Antibiotics: Pip-tazo 12/1-12/3 Ceftriaxone 12/3-p Metronidazole 12/3-p   Cultures: Blood 12/1 2/2 Ecoli Urine 12/1 no growth  SUBJECTIVE: Pt is resting in bed. Reports feeling fatigued, abdominal pain is about the same.   Review of Systems: Review of Systems  All other systems reviewed and are negative.   Scheduled Meds:  apixaban  10 mg Oral BID   Followed by   Derrill Memo ON 05/27/2021] apixaban  5 mg Oral BID   aspirin EC  81 mg Oral Daily   atorvastatin  20 mg Oral Daily   donepezil  10 mg Oral QHS   memantine  10 mg Oral BID   metoprolol tartrate  12.5 mg Oral BID   senna-docusate  2 tablet Oral BID   Continuous Infusions:  sodium chloride  10 mL/hr at 05/18/21 2258   cefTRIAXone (ROCEPHIN)  IV 2 g (05/20/21 1225)   metronidazole 500 mg (05/20/21 0825)   PRN Meds:.sodium chloride, acetaminophen **OR** [DISCONTINUED] acetaminophen, ondansetron **OR** ondansetron (ZOFRAN) IV, oxyCODONE, tiZANidine No Known Allergies  OBJECTIVE: Vitals:   05/20/21 0000 05/20/21 0400 05/20/21  0722 05/20/21 1129  BP: 114/80 126/71 121/77 102/69  Pulse: 92 68 70 72  Resp: 20 17 18 15   Temp: 98.3 F (36.8 C) 98.3 F (36.8 C) 98.9 F (37.2 C) 97.9 F (36.6 C)  TempSrc: Oral Oral Oral Oral  SpO2: 95% 95% 98% 95%  Weight:      Height:       Body mass index is 23.16 kg/m.  Physical Exam Constitutional:      General: He is not in acute distress.    Appearance: He is normal weight. He is not toxic-appearing.  HENT:     Head: Normocephalic and atraumatic.     Right Ear: External ear normal.     Left Ear: External ear normal.     Nose: No congestion or rhinorrhea.     Mouth/Throat:     Mouth: Mucous membranes are moist.     Pharynx: Oropharynx is clear.  Eyes:     Extraocular Movements: Extraocular movements intact.     Conjunctiva/sclera: Conjunctivae normal.     Pupils: Pupils are equal, round, and reactive to light.  Cardiovascular:     Rate and Rhythm: Normal rate and regular rhythm.     Heart sounds: No murmur heard.   No friction rub. No gallop.  Pulmonary:     Effort: Pulmonary effort is normal.     Breath sounds: Normal breath sounds.  Abdominal:     General: Abdomen is flat. Bowel sounds are normal.     Palpations: Abdomen is soft.  Musculoskeletal:        General: No swelling. Normal range of motion.     Cervical back: Normal range of motion and neck supple.  Skin:    General: Skin is warm and dry.  Neurological:     General: No focal deficit present.     Mental Status: He is oriented to person, place, and time.  Psychiatric:        Mood and Affect: Mood normal.      Lab Results Lab Results  Component Value Date   WBC 6.3 05/20/2021   HGB 12.6 (L) 05/20/2021   HCT 36.5 (L) 05/20/2021   MCV 86.1 05/20/2021   PLT 257 05/20/2021    Lab Results  Component Value Date   CREATININE 0.67 05/20/2021   BUN <5 (L) 05/20/2021   NA 130 (L) 05/20/2021   K 3.4 (L) 05/20/2021   CL 96 (L) 05/20/2021   CO2 27 05/20/2021    Lab Results  Component  Value Date   ALT 14 05/19/2021   AST 25 05/19/2021   ALKPHOS 85 05/19/2021   BILITOT 0.9 05/19/2021        Laurice Record, [facility] for Infectious [facility] 05/20/2021, 2:01 PM

## 2021-05-20 NOTE — Progress Notes (Signed)
Physical Therapy Treatment Patient Details Name: Andre [name] MRN: [id number] DOB: 1941/03/15 Today's Date: 05/20/2021   History of Present Illness pt is a 80 y/o male admitted with c/o rigors and abdominal pain.  Work up for sepsis due to cholecystities/E. coli bacteremia. Small PE found. 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  PMHx:  GERD, CAD, HTN, rectal CA, TIA    PT Comments    Pt making steady progress but will benefit from a rollator at DC to provide safer ambulation in the community.    Recommendations for follow up therapy are one component of a multi-disciplinary discharge planning process, led by the attending physician.  Recommendations may be updated based on patient status, additional functional criteria and insurance authorization.  Follow Up Recommendations  No PT follow up     Assistance Recommended at Discharge Intermittent Supervision/Assistance  Equipment Recommendations  Rollator (4 wheels)    Recommendations for Other Services       Precautions / Restrictions Precautions Precautions: Fall     Mobility  Bed Mobility Overal bed mobility: Modified Independent                  Transfers Overall transfer level: Needs assistance Equipment used: None Transfers: Sit to/from Stand Sit to Stand: Supervision           General transfer comment: Assist for safety and lines    Ambulation/Gait Ambulation/Gait assistance: Min guard;Supervision Gait Distance (Feet): 350 Feet Assistive device: None;Rollator (4 wheels) Gait Pattern/deviations: Step-through pattern Gait velocity: decr Gait velocity interpretation: <1.31 ft/sec, indicative of household ambulator   General Gait Details: With no device pt is supervision in room but min guard in hallway due to tentative gait. With rollator in hallway pt more confident and only needs supervision for lines. Pt with slow gait and interested in what is going on in other rooms.   Stairs              Wheelchair Mobility    Modified Rankin (Stroke Patients Only)       Balance Overall balance assessment: Needs assistance Sitting-balance support: No upper extremity supported;Feet supported Sitting balance-Leahy Scale: Good     Standing balance support: No upper extremity supported Standing balance-Leahy Scale: Fair                              Cognition Arousal/Alertness: Awake/alert Behavior During Therapy: WFL for tasks assessed/performed Overall Cognitive Status: Within Functional Limits for tasks assessed                                          Exercises      General Comments        Pertinent Vitals/Pain Pain Assessment: No/denies pain    Home Living                          Prior Function            PT Goals (current goals can now be found in the care plan section) Acute Rehab PT Goals Patient Stated Goal: return home Progress towards PT goals: Progressing toward goals    Frequency    Min 3X/week      PT Plan Current plan remains appropriate;Equipment recommendations need to be updated    Co-evaluation  AM-PAC PT "6 Clicks" Mobility   Outcome Measure  Help needed turning from your back to your side while in a flat bed without using bedrails?: None Help needed moving from lying on your back to sitting on the side of a flat bed without using bedrails?: None Help needed moving to and from a bed to a chair (including a wheelchair)?: A Little Help needed standing up from a chair using your arms (e.g., wheelchair or bedside chair)?: A Little Help needed to walk in hospital room?: A Little Help needed climbing 3-5 steps with a railing? : A Little 6 Click Score: 20    End of Session   Activity Tolerance: Patient tolerated treatment well Patient left: in bed;with call bell/phone within reach Nurse Communication: Mobility status PT Visit Diagnosis: Unsteadiness on feet (R26.81);Muscle  weakness (generalized) (M62.81);Difficulty in walking, not elsewhere classified (R26.2)     Time: 1130-1147 PT Time Calculation (min) (ACUTE ONLY): 17 min  Charges:  $Gait Training: 8-22 mins                     [place] Pager [phone] Office [place] 05/20/2021, 12:14 PM

## 2021-05-21 ENCOUNTER — Inpatient Hospital Stay (HOSPITAL_COMMUNITY): Payer: Medicare Other

## 2021-05-21 DIAGNOSIS — I2699 Other pulmonary embolism without acute cor pulmonale: Secondary | ICD-10-CM | POA: Diagnosis not present

## 2021-05-21 DIAGNOSIS — K810 Acute cholecystitis: Secondary | ICD-10-CM | POA: Diagnosis not present

## 2021-05-21 DIAGNOSIS — A419 Sepsis, unspecified organism: Secondary | ICD-10-CM | POA: Diagnosis not present

## 2021-05-21 LAB — CBC
HCT: 36.3 % — ABNORMAL LOW (ref 39.0–52.0)
Hemoglobin: 12.4 g/dL — ABNORMAL LOW (ref 13.0–17.0)
MCH: 30.1 pg (ref 26.0–34.0)
MCHC: 34.2 g/dL (ref 30.0–36.0)
MCV: 88.1 fL (ref 80.0–100.0)
Platelets: 318 10*3/uL (ref 150–400)
RBC: 4.12 MIL/uL — ABNORMAL LOW (ref 4.22–5.81)
RDW: 15.5 % (ref 11.5–15.5)
WBC: 6.4 10*3/uL (ref 4.0–10.5)
nRBC: 0 % (ref 0.0–0.2)

## 2021-05-21 MED ORDER — SODIUM CHLORIDE 0.9% FLUSH
10.0000 mL | Freq: Two times a day (BID) | INTRAVENOUS | Status: DC
  Administered 2021-05-21: 10 mL

## 2021-05-21 MED ORDER — CHLORHEXIDINE GLUCONATE CLOTH 2 % EX PADS
6.0000 | MEDICATED_PAD | Freq: Every day | CUTANEOUS | Status: DC
  Administered 2021-05-21: 6 via TOPICAL

## 2021-05-21 MED ORDER — SODIUM CHLORIDE 0.9% FLUSH: 10.0000 mL | INTRAVENOUS | Status: DC | PRN

## 2021-05-21 NOTE — TOC Transition Note (Signed)
Transition of Care [facility]) - CM/SW Discharge Note   Patient Details  Name: Andre [name] MRN: [id number] Date of Birth: 1940-10-23  [facility] CM/SW Contact:  Carles Collet, RN Phone Number: 05/21/2021, 8:51 AM   Clinical Narrative:    Verified with Jeannene Patella, [facility] liaison that [facility] RN will be at the home as early as noon today. Requested RN to DC ASAp to ensure he gets home in time. Spoke w daughter [name] to notify her of apt time. Her sister will be at the hospital to pick him up this morning.       Barriers to Discharge: Continued Medical Work up   Patient Goals and CMS Choice Patient states their goals for this hospitalization and ongoing recovery are:: to go home CMS Medicare.gov Compare Post Acute Care list provided to:: Other (Comment Required) Choice offered to / list presented to : Adult Children  Discharge Placement                       Discharge Plan and Services   Discharge Planning Services: CM Consult Post Acute Care Choice: Home Health          DME Arranged: [place] rolling DME Agency: [employer] Date DME Agency Contacted: 05/20/21 Time DME Agency Contacted: 1200 Representative spoke with at DME Agency: [place]: RN [place]:  [place] Govern) Date [place]: 05/20/21 Time [place]: 1117 Representative spoke with at [place]: [place] from [facility] setting up with [place] Determinants of Health ([place]) Interventions     Readmission Risk Interventions No flowsheet data found.

## 2021-05-21 NOTE — Progress Notes (Signed)
Seen and examined-no major issues overnight-remains stable for discharge now that PICC line was placed earlier this morning.  See discharge summary done yesterday for further details.

## 2021-05-21 NOTE — Progress Notes (Signed)
Peripherally Inserted Central Catheter Placement  The IV Nurse has discussed with the patient and/or persons authorized to consent for the patient, the purpose of this procedure and the potential benefits and risks involved with this procedure.  The benefits include less needle sticks, lab draws from the catheter, and the patient may be discharged home with the catheter. Risks include, but not limited to, infection, bleeding, blood clot (thrombus formation), and puncture of an artery; nerve damage and irregular heartbeat and possibility to perform a PICC exchange if needed/ordered by physician.  Alternatives to this procedure were also discussed.  Bard Power PICC patient education guide, fact sheet on infection prevention and patient information card has been provided to patient /or left at bedside.    PICC Placement Documentation  PICC Single Lumen 05/21/21 Right Brachial 40 cm 0 cm (Active)  Indication for Insertion or Continuance of Line Home intravenous therapies (PICC only) 05/21/21 0235  Exposed Catheter (cm) 0 cm 05/21/21 0235  Site Assessment Clean;Dry;Intact 05/21/21 0235  Line Status Flushed;Blood return noted;Saline locked 05/21/21 0235  Dressing Type Transparent 05/21/21 0235  Dressing Status Clean;Dry;Intact 05/21/21 0235  Antimicrobial disc in place? Yes 05/21/21 0235  Safety Lock Not Applicable 59/16/38 4665  Line Care Connections checked and tightened 05/21/21 0235  Dressing Intervention New dressing 05/21/21 0235  Dressing Change Due 05/28/21 05/21/21 0235       Blythe Hartshorn, Cathlyn Parsons 05/21/2021, 2:37 AM

## 2021-05-22 DIAGNOSIS — I2699 Other pulmonary embolism without acute cor pulmonale: Secondary | ICD-10-CM | POA: Diagnosis not present

## 2021-05-22 DIAGNOSIS — A419 Sepsis, unspecified organism: Secondary | ICD-10-CM | POA: Diagnosis not present

## 2021-05-22 DIAGNOSIS — K810 Acute cholecystitis: Secondary | ICD-10-CM | POA: Diagnosis not present

## 2021-05-23 ENCOUNTER — Ambulatory Visit: Payer: Medicare Other

## 2021-05-23 NOTE — Progress Notes (Incomplete)
Chronic Care Management Pharmacy Note  05/23/2021 Name:  Andre [name] MRN:  [id number] DOB:  [birth year]  Summary: ***  Recommendations/Changes made from today's visit: ***  Plan: ***  Subjective: Andre [name] is an 80 y.o. year old male who is a primary patient of [name], Hunt Oris, MD.  The CCM team was consulted for assistance with disease management and care coordination needs.    Engaged with patient face to face for initial visit in response to provider referral for pharmacy case management and/or care coordination services.   Consent to Services:  The patient was given the following information about Chronic Care Management services today, agreed to services, and gave verbal consent: 1. CCM service includes personalized support from designated clinical staff supervised by the primary care provider, including individualized plan of care and coordination with other care providers 2. 24/7 contact phone numbers for assistance for urgent and routine care needs. 3. Service will only be billed when office clinical staff spend 20 minutes or more in a month to coordinate care. 4. Only one practitioner may furnish and bill the service in a calendar month. 5.The patient may stop CCM services at any time (effective at the end of the month) by phone call to the office staff. 6. The patient will be responsible for cost sharing (co-pay) of up to 20% of the service fee (after annual deductible is met). Patient agreed to services and consent obtained.  Patient Care Team: Biagio Borg, MD as PCP - General (Internal Medicine) [facility] as Consulting Physician (Ophthalmology) Marcial Pacas, MD as Consulting Physician (Neurology) Delice Bison Darnelle Maffucci, [facility] as Pharmacist (Pharmacist)  Recent office visits: 04/06/2021 - Dr. [name] - evaluation of right upper quadrant pain  - referred to gastro and general surgery - urology f/u 11/17 03/28/2021 - Dr. [name] - evaluation of epigastric pain - start  vitamin b12 1026mg daily  12/31/2020 - Dr. JJenny Reichmann- wellness exam - declines increase in atorvastatin - start vitamin b12 10059m daily   Recent consult visits: 01/14/2021 - Dr. MaPrudence Davidson Podiatry - mechanical debridement  - f/u in 6 months   Hospital visits: 05/12/2021-05/21/2021 - Admission to [facility] Sepsis secondary to E. Coli bacteremia - noted PE - started on eliquis outpatient  -discontinue statin for now due to concern for liver lesion   Stop: ASA, atorvastatin, dnoepezil, hctz, losartan, memantine, oxycodone, tizanidine, vit b12, vit D  Start: Amlodipine, Eliquis, Ceftriaxone, metronidazole, pantoprazole, sucralfate 04/03/2021 - ED visit due to RLQ pain x 2 weeks  - GI cocktail given, right renal mass noted - to follow up with urology - noted gallstones as well - patient felt surgery was not necessary at this point  03/10/2021 - ED visit for abdominal pain - discharged same day - no changes to medications  11/10/2020 - ED visit for chest pain x 3 days - EKG reassuring, troponin negative - discharged no changes to medications   Objective:  Lab Results  Component Value Date   CREATININE 0.67 05/20/2021   BUN <5 (L) 05/20/2021   GFR 81.66 03/28/2021   GFRNONAA >60 05/20/2021   GFRAA >60 02/29/2020   NA 130 (L) 05/20/2021   K 3.4 (L) 05/20/2021   CALCIUM 7.9 (L) 05/20/2021   CO2 27 05/20/2021   GLUCOSE 100 (H) 05/20/2021    Lab Results  Component Value Date/Time   HGBA1C 6.4 03/28/2021 10:18 AM   HGBA1C 6.7 (H) 12/31/2020 09:38 AM   GFR 81.66 03/28/2021 10:18  AM   GFR 82.64 12/31/2020 09:38 AM   MICROALBUR 1.2 12/31/2020 09:38 AM   MICROALBUR 4.4 (H) 08/07/2019 11:25 AM    Last diabetic Eye exam:  No results found for: HMDIABEYEEXA  Last diabetic Foot exam:  No results found for: HMDIABFOOTEX   Lab Results  Component Value Date   CHOL 143 12/31/2020   HDL 37.90 (L) 12/31/2020   LDLCALC 84 12/31/2020   TRIG 107.0 12/31/2020   CHOLHDL 4 12/31/2020     Hepatic Function Latest Ref Rng & Units 05/19/2021 05/18/2021 05/14/2021  Total Protein 6.5 - 8.1 g/dL 5.1(L) 5.2(L) 5.6(L)  Albumin 3.5 - 5.0 g/dL 2.0(L) 2.0(L) 2.6(L)  AST 15 - 41 U/L 25 30 41  ALT 0 - 44 U/L 14 17 21   Alk Phosphatase 38 - 126 U/L 85 88 74  Total Bilirubin 0.3 - 1.2 mg/dL 0.9 1.4(H) 2.2(H)  Bilirubin, Direct 0.0 - 0.3 mg/dL - - -    Lab Results  Component Value Date/Time   TSH 0.433 05/13/2021 05:08 AM   TSH 1.07 12/31/2020 09:38 AM   TSH 0.99 08/07/2019 11:18 AM   FREET4 0.72 10/17/2015 08:42 AM    CBC Latest Ref Rng & Units 05/21/2021 05/20/2021 05/19/2021  WBC 4.0 - 10.5 K/uL 6.4 6.3 5.6  Hemoglobin 13.0 - 17.0 g/dL 12.4(L) 12.6(L) 12.6(L)  Hematocrit 39.0 - 52.0 % 36.3(L) 36.5(L) 36.7(L)  Platelets 150 - 400 K/uL 318 257 216    Lab Results  Component Value Date/Time   VD25OH 23.34 (L) 12/31/2020 09:38 AM   VD25OH 8.0 (L) 06/24/2020 02:46 PM   VD25OH 8.6 (L) 12/17/2019 08:31 AM   VD25OH <7.00 (L) 08/07/2019 11:18 AM    Clinical ASCVD: Yes  The ASCVD Risk score (Arnett DK, et al., 2019) failed to calculate for the following reasons:   The patient has a prior MI or stroke diagnosis    Depression screen [facility] 2/9 12/31/2020 07/13/2020 09/30/2019  Decreased Interest 0 0 0  Down, Depressed, Hopeless 0 0 1  PHQ - 2 Score 0 0 1  Altered sleeping - - 0  Tired, decreased energy - - 1  Change in appetite - - 0  Feeling bad or failure about yourself  - - 0  Trouble concentrating - - 0  Moving slowly or fidgety/restless - - 0  PHQ-9 Score - - 2  Difficult doing work/chores - - Not difficult at all  Some recent data might be hidden     ***Other: (CHADS2VASc if Afib, MMRC or CAT for COPD, ACT, DEXA)  Social History   Tobacco Use  Smoking Status Some Days   Packs/day: 0.25   Types: Cigarettes  Smokeless Tobacco Never   BP Readings from Last 3 Encounters:  05/21/21 128/63  04/06/21 138/66  04/04/21 115/70   Pulse Readings from Last 3 Encounters:   05/21/21 65  04/06/21 (!) 56  04/04/21 67   Wt Readings from Last 3 Encounters:  05/14/21 161 lb 6 oz (73.2 kg)  04/06/21 165 lb (74.8 kg)  03/28/21 167 lb 12.8 oz (76.1 kg)   BMI Readings from Last 3 Encounters:  05/14/21 23.16 kg/m  04/06/21 23.01 kg/m  03/28/21 23.40 kg/m    Assessment/Interventions: Review of patient past medical history, allergies, medications, health status, including review of consultants reports, laboratory and other test data, was performed as part of comprehensive evaluation and provision of chronic care management services.   SDOH:  (Social Determinants of Health) assessments and interventions performed: {yes/no:20286}  SDOH Screenings  Alcohol Screen: Low Risk    Last Alcohol Screening Score (AUDIT): 3  Depression (PHQ2-9): Low Risk    PHQ-2 Score: 0  Financial Resource Strain: Low Risk    Difficulty of Paying Living Expenses: Not hard at all  Food Insecurity: No Food Insecurity   Worried About Charity fundraiser in the Last Year: Never true   Ran Out of Food in the Last Year: Never true  Housing: Low Risk    Last Housing Risk Score: 0  Physical Activity: Sufficiently Active   Days of Exercise per Week: 5 days   Minutes of Exercise per Session: 30 min  Social Connections: Moderately Integrated   Frequency of Communication with Friends and Family: More than three times a week   Frequency of Social Gatherings with Friends and Family: More than three times a week   Attends Religious Services: 1 to 4 times per year   Active Member of [employer] or Organizations: Yes   Attends Music therapist: More than 4 times per year   Marital Status: Separated  Stress: No Stress Concern Present   Feeling of Stress : Not at all  Tobacco Use: High Risk   Smoking Tobacco Use: Some Days   Smokeless Tobacco Use: Never   Passive Exposure: Not on file  Transportation Needs: No Transportation Needs   Lack of Transportation (Medical): No   Lack of  Transportation (Non-Medical): No    CCM Care Plan  No Known Allergies  Medications Reviewed Today     Reviewed by Kirke Shaggy, RN (Registered Nurse) on 05/18/21 at [place] List Status: Complete   Medication Order Taking? Sig Documenting Provider Last Dose Status Informant  amLODipine (NORVASC) 2.5 MG tablet [id number] Yes Take 2.5 mg by mouth daily. [provider] 05/12/2021 Active Self           Med Note Marcell Barlow   Sat May 14, 2021  9:49 AM)    aspirin EC 81 MG tablet [id number] Yes Take 1 tablet (81 mg total) by mouth daily. Binnie Rail, MD 05/12/2021 Active Self  atorvastatin (LIPITOR) 20 MG tablet [id number] No Take 1 tablet (20 mg total) by mouth daily.  Patient not taking: Reported on 05/14/2021   Biagio Borg, MD Not Taking Active Self           Med Note Payton Doughty   Fri May 13, 2021  5:55 PM) #90 [facility]  donepezil (ARICEPT) 10 MG tablet [id number] No Take 1 tablet (10 mg total) by mouth at bedtime.  Patient not taking: Reported on 05/14/2021   Marcial Pacas, MD Not Taking Active Self  hydrochlorothiazide (HYDRODIURIL) 25 MG tablet [id number] Yes TAKE 1 TABLET(25 MG) BY MOUTH DAILY  Patient taking differently: Take 25 mg by mouth daily.   Biagio Borg, MD 05/12/2021 Active Self           Med Note Germain Osgood F   Sat May 14, 2021  9:50 AM)    losartan (COZAAR) 100 MG tablet [id number] No Take 1 tablet (100 mg total) by mouth daily.  Patient not taking: Reported on 05/14/2021   Biagio Borg, MD Not Taking Active Self  memantine [facility]) 10 MG tablet [id number] No Take 1 tablet (10 mg total) by mouth 2 (two) times daily.  Patient not taking: Reported on 05/14/2021   Marcial Pacas, MD Not Taking Active Self  oxycodone (OXY-IR) 5 MG capsule [id number] No Take 1 capsule (  5 mg total) by mouth every 4 (four) hours as needed.  Patient not taking: Reported on 05/14/2021   Tacy Learn, PA-C Not Taking Active Self            Med Note Orvan Seen, Sharlette Dense   Fri May 13, 2021  5:52 PM) No record of this RX in PMP AWARE  sucralfate (CARAFATE) 1 g tablet [id number] Yes Take 1 tablet (1 g total) by mouth 4 (four) times daily. Biagio Borg, MD 05/12/2021 Active Self           Med Note Germain Osgood F   Sat May 14, 2021  9:51 AM)    tizanidine (ZANAFLEX) 2 MG capsule [id number] No Take 1 capsule (2 mg total) by mouth at bedtime as needed for muscle spasms.  Patient not taking: Reported on 05/14/2021   Biagio Borg, MD Not Taking Active Self  vitamin B-12 (CYANOCOBALAMIN) 1000 MCG tablet [id number] No Take 1 tablet (1,000 mcg total) by mouth daily.  Patient not taking: Reported on 05/14/2021   Biagio Borg, MD Not Taking Active Self  Vitamin D, Ergocalciferol, (DRISDOL) 1.25 MG (50000 UNIT) CAPS capsule [id number] No TAKE 1 CAPSULE BY MOUTH EVERY 7 DAYS  Patient not taking: Reported on 05/14/2021   Suzzanne Cloud, NP Not Taking Active Self           Med Note Orvan Seen, Sharlette Dense   Fri May 13, 2021  5:55 PM) #10/70 days [facility]/20 [facility]            Patient Active Problem List   Diagnosis Date Noted   Sepsis ([place]) 05/13/2021   Acute cholecystitis 05/13/2021   Pulmonary embolism ([place]) 05/13/2021   RUQ pain 04/06/2021   Kidney mass 04/06/2021   Epigastric pain 03/28/2021   Pain due to onychomycosis of toenails of both feet 01/14/2021   Blurry vision, bilateral 12/31/2020   Nail disorder 12/31/2020   Left shoulder pain 02/11/2020   Right shoulder pain 02/11/2020   Vitamin B12 deficiency 12/17/2019   Mild cognitive impairment 12/17/2019   Peripheral neuropathy 12/17/2019   Vitamin D deficiency 12/01/2019   Patellar tendonitis of left knee 12/01/2019   Allergic rhinitis 10/07/2019   Dementia ([place]) 10/07/2019   CAD (coronary artery disease) 09/30/2019   Amnesia 09/10/2019   Right-sided nosebleed 08/07/2019   Encounter for well adult exam with abnormal findings 08/07/2019   Leg cramping  08/07/2019   Chest pain 03/23/2019   Epistaxis 03/23/2019   Hyperlipidemia 12/24/2017   Alcohol use 12/24/2017   Non-compliance 12/24/2017   Degenerative disc disease, lumbar 11/15/2017   Left lumbar radiculopathy 08/02/2017   Atherosclerosis 06/03/2017   Low-level of literacy 05/12/2017   Osteoarthritis 04/20/2017   Diabetes ([place]) 03/12/2017   Back pain 02/29/2016   Abnormal stress test 10/18/2015   Erectile dysfunction 02/25/2015   Cigarette smoker 02/10/2015   COPD GOLD 0 11/08/2014   Hemorrhoids 02/11/2014   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   Headache 10/23/2011   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[place]) 10/23/2011   GERD (gastroesophageal reflux disease) 10/23/2011   Nicotine addiction 10/23/2011   Alcohol abuse, 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     Medication Assistance: {MEDASSISTANCEINFO:25044}  Compliance/Adherence/Medication fill history: Care Gaps: ***  Patient's preferred pharmacy is:  [facility] [phone] - [place], [place] - [address] [place] [address] Phone: [phone] Fax: [place] [address] [address] Phone: [phone] Fax: [phone]   Uses pill box? {Yes or If no, why not?:20788} Pt endorses ***% compliance  Care Plan and Follow Up Patient Decision:  {FOLLOWUP:24991}  Plan: {CM FOLLOW UP TRVU:02334} ***  Current Barriers:   {pharmacybarriers:24917}  Pharmacist Clinical Goal(s):  Patient will {PHARMACYGOALCHOICES:24921} through collaboration with PharmD and provider.   Interventions: 1:1 collaboration with Biagio Borg, MD regarding development and update of comprehensive plan of care as evidenced by provider attestation and co-signature Inter-disciplinary care team collaboration (see longitudinal plan of care) Comprehensive medication review performed; medication list updated in electronic medical record  Hypertension (BP goal <140/90) -{US controlled/uncontrolled:25276} -Current treatment: Amlodipine 2.37m - 1 tablet daily -Medications previously tried: atenolol, chlorthalidone, lisinopril, metoprolol, propranolol, valsartan,   -Current home readings: *** -Current dietary habits: *** -Current exercise habits: *** -{ACTIONS;DENIES/REPORTS:21021675::"Denies"} hypotensive/hypertensive symptoms -Educated on {CCM BP Counseling:25124} -Counseled to monitor BP at home ***, document, and provide log at future appointments -{CCMPHARMDINTERVENTION:25122}  Pulmonary Embolism (Goal: Treatment of PE / prevention of AE from anticoagulation ) -{US controlled/uncontrolled:25276} -Current treatment  Eliquis - 158mBID x 7 days, then 49m10mwice daily  -Medications previously tried: ***  -{CCMPHARMDINTERVENTION:25122}   Hyperlipidemia/ Coronary Artery Disease / History of TIA: (LDL goal < 70) -Not ideally controlled Lab Results  Component Value Date   [place] 12/31/2020  -Current treatment: N/a - holding statin at this time due to concern about lesion on liver with latest imaging  -Medications previously tried: n/a  -Current dietary patterns: *** -Current exercise habits: *** -Educated on {CCM HLD Counseling:25126} -{CCMPHARMDINTERVENTION:25122}  Patient Goals/Self-Care Activities Patient will:  - {pharmacypatientgoals:24919}  Follow Up Plan: {CM FOLLOW UP PLADHWY:61683}hart prep = 30 minutes

## 2021-05-24 ENCOUNTER — Telehealth: Payer: Self-pay | Admitting: Internal Medicine

## 2021-05-24 NOTE — Telephone Encounter (Signed)
Pt daughter has called to r/s pharmacy appt on yesterday. States pt has been in hospital. Please leave detailed message if she does not answer.     Callback #- [phone]

## 2021-05-25 DIAGNOSIS — A419 Sepsis, unspecified organism: Secondary | ICD-10-CM | POA: Diagnosis not present

## 2021-05-25 DIAGNOSIS — K810 Acute cholecystitis: Secondary | ICD-10-CM | POA: Diagnosis not present

## 2021-05-25 DIAGNOSIS — I2699 Other pulmonary embolism without acute cor pulmonale: Secondary | ICD-10-CM | POA: Diagnosis not present

## 2021-05-26 ENCOUNTER — Encounter: Payer: Self-pay | Admitting: Hematology

## 2021-05-26 ENCOUNTER — Inpatient Hospital Stay: Payer: Medicare Other | Attending: Hematology | Admitting: Hematology

## 2021-05-26 ENCOUNTER — Other Ambulatory Visit: Payer: Self-pay

## 2021-05-26 VITALS — BP 139/83 | HR 69 | Temp 97.7°F | Resp 16 | Ht 70.0 in | Wt 150.9 lb

## 2021-05-26 DIAGNOSIS — C23 Malignant neoplasm of gallbladder: Secondary | ICD-10-CM

## 2021-05-26 DIAGNOSIS — K8060 Calculus of gallbladder and bile duct with cholecystitis, unspecified, without obstruction: Secondary | ICD-10-CM | POA: Diagnosis not present

## 2021-05-26 DIAGNOSIS — Z87891 Personal history of nicotine dependence: Secondary | ICD-10-CM | POA: Insufficient documentation

## 2021-05-26 DIAGNOSIS — Z85048 Personal history of other malignant neoplasm of rectum, rectosigmoid junction, and anus: Secondary | ICD-10-CM | POA: Diagnosis not present

## 2021-05-26 DIAGNOSIS — C778 Secondary and unspecified malignant neoplasm of lymph nodes of multiple regions: Secondary | ICD-10-CM | POA: Diagnosis not present

## 2021-05-26 DIAGNOSIS — Z809 Family history of malignant neoplasm, unspecified: Secondary | ICD-10-CM | POA: Diagnosis not present

## 2021-05-26 DIAGNOSIS — I119 Hypertensive heart disease without heart failure: Secondary | ICD-10-CM | POA: Insufficient documentation

## 2021-05-26 DIAGNOSIS — C20 Malignant neoplasm of rectum: Secondary | ICD-10-CM | POA: Diagnosis not present

## 2021-05-26 MED ORDER — MIRTAZAPINE 7.5 MG PO TABS: 7.5000 mg | ORAL_TABLET | Freq: Every day | ORAL | 1 refills | Status: DC

## 2021-05-26 NOTE — Progress Notes (Signed)
I met with Andre [name] and his daughters after his consultation with Dr Burr Medico.  I explained my role as a nurse navigator and provided my contact information. I explained the services provided at [facility] and provided written information.  I explained the alight grant and let  them know one of the financial advisors will reach out to  them at the time of his chemo education class.  I told them after getting insurance prior authorization I will schedule his PET scan and f/u appt with Dr Burr Medico.  All questions were answered.  They verbalized understanding.

## 2021-05-26 NOTE — Progress Notes (Signed)
[place]   [phone] Fax:([phone]) [place] Note   Patient Care Team: Biagio Borg, MD as PCP - General (Internal Medicine) Faison, P.A. 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 Benson Norway  ASSESSMENT & PLAN:  Andre [name] is a 80 y.o.  male with   1.  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 CT AP 04/03/21 showed 3 cm right renal mass, abnormal gallbladder with stones, and upper abdominal adenopathy. CT chest showed a 1.1cm lung nodule in LUL -he developed sepsis and was admitted 05/12/21. Abdomen MRI showed: cholelithiasis, pericholecystic liver lesion, upper abdominal necrotic adenopathy, mild biliary duct dilatation, right renal mass, mild edema in pararenal space. -EUS biopsy on 05/18/21 showed metastatic adenocarcinoma to para-pancreatic lymph node. I discussed with him. Will discuss with path to see if adequate sample for studies   --I reviewed the work up and biopsy findings thus far with the patient and his daughters. From the images and EUS findings, the primary is likely gallbladder, also metastatic renal cell carcinoma is also a possibility.  I recommend obtaining PET scan to further evaluate the extent of his disease and possibly find a primary tumor.  2. Right renal mass  -probably renal cell carcinoma based on images, biopsy not due yet -seen by urologist Dr. Abner Greenspan -will decide management after the other metastatic cancer work up    3. H/o colorectal cancer  2009 -s/p resection 08/23/07 showing no residual neoplasm.  4. HTN, GERD -continue medication    PLAN:  -PET scan in 1-2 weeks -f/u after PET  -tumor board discuss next week    Oncology History  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[place])  10/23/2011 Initial Diagnosis   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[place])   04/03/2021 Imaging   EXAM: CT ABDOMEN AND PELVIS WITH CONTRAST  IMPRESSION: 1. Enhancing right renal mass measuring 3 x 2.6 cm, highly suspicious for renal cell carcinoma. Recommend urology consultation. 2. Abnormal appearance of the gallbladder which is only partially distended. There is intraluminal density as well as suggestion of diffuse wall thickening. The adjacent liver parenchyma demonstrates decreased density which may be edema. In addition, there is intra and extrahepatic biliary ductal dilatation. No visualized choledocholithiasis. Recommend correlation with LFTs, as well as further assessment with MRCP. MRCP should only be considered when patient is able to tolerate breath hold technique 3. Upper abdominal adenopathy, some of which appears low-density/necrotic, measuring up to 16 mm. These are nonspecific, given renal and gallbladder findings, metastatic disease is not entirely excluded. 4. Presumed contrast mixing in the renal veins and IVC. 5. Colonic diverticulosis without diverticulitis. 6. Aortic and branch atherosclerosis. 7. Mildly enlarged prostate gland.   04/04/2021 Imaging   EXAM: ULTRASOUND ABDOMEN LIMITED RIGHT UPPER QUADRANT  IMPRESSION: 1. Cholelithiasis with findings suggestive of acute cholecystitis. A hepatobiliary scintigraphy may provide better evaluation of the gallbladder if there is a high clinical concern for acute cholecystitis . 2. Fatty liver. 3. Right renal mass.   05/12/2021 Imaging   EXAM: CT ANGIOGRAPHY CHEST WITH CONTRAST  IMPRESSION: 1. Small amount of pulmonary embolism within a subsegmental lower lobe branch of  the left pulmonary artery. 2. 1.1 cm  noncalcified lung nodule within the posterior aspect of the left upper lobe. This represents a new finding when compared to the prior exam. Consider one of the following in 3 months for both low-risk and high-risk individuals: (a) repeat chest CT, (b) follow-up PET-CT, or (c) tissue sampling. This recommendation follows the consensus statement: Guidelines for Management of Incidental Pulmonary Nodules Detected on CT Images: From the Fleischner Society 2017; Radiology 2017; [phone].   05/12/2021 Imaging   EXAM: CT ABDOMEN AND PELVIS WITH CONTRAST  IMPRESSION: Increase in the inflammatory changes surrounding the gallbladder and extending into the adjacent liver again suspicious for acute cholecystitis with localized hepatic inflammatory change. Correlate with pending ultrasound exam   Upper abdominal lymphadenopathy which appears necrotic and stable in appearance from the prior exam. Patient is scheduled for PET-CT according to outpatient clinic notes.   Stable enhancing right renal mass suspicious for renal cell carcinoma. By history the patient has met with urology.   Remainder of the exam is stable from the prior study.   05/12/2021 Imaging   EXAM: ULTRASOUND ABDOMEN LIMITED RIGHT UPPER QUADRANT  IMPRESSION: Stable changes in the gallbladder with cholelithiasis and gallbladder wall thickening. Negative sonographic Murphy's sign is elicited however. This may be in part due to underlying medications.   05/13/2021 Imaging   EXAM: MRI ABDOMEN WITHOUT AND WITH CONTRAST (INCLUDING MRCP)  IMPRESSION: 1. Motion degraded images. 2. Cholelithiasis. Suggestion of wall thickening in the setting of underdistention. Development of a pericholecystic liver lesion which is suspicious for extension of chronic cholecystitis. Direct extension of gallbladder carcinoma felt less likely, given absence of dominant gallbladder mass. 3. Upper abdominal necrotic adenopathy, present back to  04/03/2021. Outpatient PET may be informative to direct sampling. 4. Mild intra and extrahepatic biliary duct dilatation. Equivocal findings for distal common duct stone, given above limitations. If bilirubin is elevated, consider ERCP. 5. Interpolar right renal enhancing mass, consistent with renal cell carcinoma. 6. Mild edema within the anterior pararenal space. Correlate with pancreatic enzyme levels to exclude mild pancreatitis. 7. Right base airspace disease, new since yesterday's CT. Favor atelectasis.   05/18/2021 Procedure   Upper EUS, Dr. Benson Norway  Findings: Multiple malignant-appearing lymph nodes were visualized in the peripancreatic region. These were fifteen mm from the primary tumor. The largest measured 10 mm by 20 mm in maximal cross-sectional diameter. The nodes were oval, heterogenous and had well defined margins. Fine needle aspiration for cytology was performed. Color Doppler imaging was utilized prior to needle puncture to confirm a lack of significant vascular structures within the needle path. Five passes were made with the 25 gauge needle using a transgastric approach. A stylet was used. A cytotechnologist was present to evaluate the adequacy of the specimen. Final cytology results are pending.   Multiple peripancreatic lymph nodes were identified. The largest one viewed measured approximatly 10 mm x 20 mm. Multiple passes with the 25 gauge FNA needle were performed through these lymph nodes and an adequate sampling was obtained. In the hilar region of the liver a 20 - 30 mm node/mass/abnormality was identified. It was hyperechoic. The gallbladder was not visualized after multiple attempts with repositioning and tracing the CBD proximally. There was an area near this lesion that was anechoic, potentially being a portion of the gallbladder. This area was adjacent or in the path of a potential FNA. With this uncertainty about the location of the gallbladder in relation to his  lesion, an FNA was not performed.  Impression: - Multiple malignant-appearing lymph nodes were visualized in the peripancreatic region. Fine needle aspiration performed.   05/18/2021 Pathology Results   A. 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       HISTORY OF PRESENTING ILLNESS:  Andre [name] 80 y.o. male is a here because of recently diagnosed metastatic adenocarcinoma. The patient was referred by Dr. Benson Norway. The patient presents to the clinic today accompanied by two of his daughters.   He initially presented with right lower abdominal pain and weight loss. He underwent CT AP in the ED on 04/03/21 showing: 3 cm right renal mass; abnormal appearance of gallbladder; upper abdominal adenopathy.  He unfortunately developed sepsis and was admitted on 05/12/21. Abdomen MRI performed the following day showed: cholelithiasis, suggestion of wall thickening in setting of underdistention, development of pericholecystic liver lesion suspicious for extension of chronic cholecystitis; upper abdominal necrotic adenopathy; mild intra and extrahepatic biliary duct dilatation; interpolar right renal enhancing mass; mild edema within anterior pararenal space.  He proceeded to EUS during hospitalization on 05/18/21 to further evaluate the suspicious lymph nodes. Aspiration was obtained from one of the para-pancreatic lymph nodes and revealed malignant cells consistent with metastatic adenocarcinoma. 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   Today the patient notes they felt/feeling prior/after... -he has lost 25+ lbs since 12/2020  He has a PMHx of.... -colorectal cancer 2009, excision  08/23/07 benign path -s/p heart catheterization 06/2011   Socially... -he has 10 children, 2 daughters and 8 sons. -He reports prior history of smoking and drinking alcohol, quit with diagnosis.    REVIEW OF SYSTEMS:    Constitutional: Denies fevers, chills or abnormal night sweats Eyes: Denies blurriness of vision, double vision or watery eyes Ears, nose, mouth, throat, and face: Denies mucositis or sore throat Respiratory: Denies cough, dyspnea or wheezes Cardiovascular: Denies palpitation, chest discomfort or lower extremity swelling Gastrointestinal:  Denies nausea, heartburn or change in bowel habits Skin: Denies abnormal skin rashes Lymphatics: Denies new lymphadenopathy or easy bruising Neurological:Denies numbness, tingling or new weaknesses Behavioral/Psych: Mood is stable, no new changes  All other systems were reviewed with the patient and are negative.   MEDICAL HISTORY:  Past Medical History:  Diagnosis Date   Arthritis    Coronary artery disease, non-occlusive    a. 06/2011: cath for abnormal exercise echo: Only 30-40% proximal RCA. Otherwise normal. b. 10/2015: cath for abnormal NST and atypical CP --> showed mild 30-40% stenosis in the prox-distal RCA   GERD (gastroesophageal reflux disease)    HA (headache)    Hypertension    Memory loss    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[place])    TIA (transient ischemic attack)    Weakness     SURGICAL HISTORY: Past Surgical History:  Procedure Laterality Date   CARDIAC CATHETERIZATION  January 2013   Proximal RCA 30-40%. Otherwise normal.   CARDIAC CATHETERIZATION N/A 10/18/2015   Procedure: Left Heart Cath and Coronary Angiography;  Surgeon: Jettie Booze, MD;  Location: [facility];  Service: Cardiovascular;  Laterality: N/A;   ESOPHAGOGASTRODUODENOSCOPY (EGD) WITH PROPOFOL N/A 05/18/2021   Procedure: ESOPHAGOGASTRODUODENOSCOPY (EGD) WITH PROPOFOL;  Surgeon: Carol Ada, MD;  Location: [place];  Service: Endoscopy;   Laterality: N/A;   EUS Left 05/18/2021   Procedure:  UPPER ENDOSCOPIC ULTRASOUND (EUS) LINEAR;  Surgeon: Carol Ada, MD;  Location: [place];  Service: Endoscopy;  Laterality: Left;   FINE NEEDLE ASPIRATION  05/18/2021   Procedure: FINE NEEDLE ASPIRATION (FNA) LINEAR;  Surgeon: Carol Ada, MD;  Location: [place];  Service: Endoscopy;;   NM MYOVIEW LTD  March 2015   LOW RISK. No scar or ischemia. Normal EF (55%). No RWMA   surgical excision of rectal cancer      SOCIAL HISTORY: Social History   Socioeconomic History   Marital status: Single    Spouse name: Not on file   Number of children: 85   Years of education: 12   Highest education level: Not on file  Occupational History   Occupation: Retried    Comment: retired  Tobacco Use   Smoking status: Former    Packs/day: 0.50    Years: 50.00    Pack years: 25.00    Types: Cigarettes    Quit date: 05/01/2021    Years since quitting: 0.0   Smokeless tobacco: Never  Vaping Use   Vaping Use: Never used  Substance and Sexual Activity   Alcohol use: Yes    Alcohol/week: 7.0 standard drinks    Types: 7 Shots of liquor per week    Comment: stopped in 04/2021   Drug use: No   Sexual activity: Not on file  Other Topics Concern   Not on file  Social History Narrative   Patient is single, retired. Lives alone.   Patient is right handed   Education level is high school   Caffeine consumption is 1 cup daily   Social Determinants of Health   Financial Resource Strain: Low Risk    Difficulty of Paying Living Expenses: Not hard at all  Food Insecurity: No Food Insecurity   Worried About Charity fundraiser in the Last Year: Never true   Arboriculturist in the Last Year: Never true  Transportation Needs: No Transportation Needs   Lack of Transportation (Medical): No   Lack of Transportation (Non-Medical): No  Physical Activity: Sufficiently Active   Days of Exercise per Week: 5 days   Minutes of Exercise per  Session: 30 min  Stress: No Stress Concern Present   Feeling of Stress : Not at all  Social Connections: Moderately Integrated   Frequency of Communication with Friends and Family: More than three times a week   Frequency of Social Gatherings with Friends and Family: More than three times a week   Attends Religious Services: 1 to 4 times per year   Active Member of [employer] or Organizations: Yes   Attends Music therapist: More than 4 times per year   Marital Status: Separated  Intimate Partner Violence: Not on file    FAMILY HISTORY: Family History  Problem Relation Age of Onset   Hypertension Mother    Cancer Mother        unknown type   Cancer Father        unknown type cancer   Liver disease Father     ALLERGIES:  has No Known Allergies.  MEDICATIONS:  Current Outpatient Medications  Medication Sig Dispense Refill   mirtazapine (REMERON) 7.5 MG tablet Take 1 tablet (7.5 mg total) by mouth at bedtime. 30 tablet 1   amLODipine (NORVASC) 2.5 MG tablet Take 2.5 mg by mouth daily.     APIXABAN (ELIQUIS) VTE STARTER PACK (10MG AND 5MG) Take as directed on package: start with two-34m tablets twice  daily for 7 days. On day 8, switch to one-36m tablet twice daily. 74 each 0   cefTRIAXone (ROCEPHIN) IVPB Inject 2 g into the vein daily for 20 days. Indication:  E.coli intra-abd infection First Dose: Yes Last Day of Therapy:  06/09/21 Labs - Once weekly:  CBC/D and BMP, Labs - Every other week:  ESR and CRP Method of administration: IV Push Method of administration may be changed at the discretion of home infusion pharmacist based upon assessment of the patient and/or caregiver's ability to self-administer the medication ordered. 20 Units 0   metroNIDAZOLE (FLAGYL) 500 MG tablet Take 1 tablet (500 mg total) by mouth 2 (two) times daily for 20 days. Stop date 06/09/21 40 tablet 0   pantoprazole (PROTONIX) 40 MG tablet Take 1 tablet (40 mg total) by mouth daily. 30 tablet 1    sucralfate (CARAFATE) 1 g tablet Take 1 tablet (1 g total) by mouth 4 (four) times daily. 120 tablet 0   No current facility-administered medications for this visit.    PHYSICAL EXAMINATION: ECOG PERFORMANCE STATUS: 1 - Symptomatic but completely ambulatory  Vitals:   05/26/21 1454  BP: 139/83  Pulse: 69  Resp: 16  Temp: 97.7 F (36.5 C)  SpO2: 97%   Filed Weights   05/26/21 1454  Weight: 150 lb 14.4 oz (68.4 kg)    GENERAL:alert, no distress and comfortable SKIN: skin color, texture, turgor are normal, no rashes or significant lesions EYES: normal, Conjunctiva are pink and non-injected, sclera clear  NECK: supple, thyroid normal size, non-tender, without nodularity LYMPH:  no palpable lymphadenopathy in the cervical, axillary  LUNGS: clear to auscultation and percussion with normal breathing effort HEART: regular rate & rhythm and no murmurs and no lower extremity edema ABDOMEN:abdomen soft and normal bowel sounds, (+) diffuse tenderness Musculoskeletal:no cyanosis of digits and no clubbing  NEURO: alert & oriented x 3 with fluent speech, no focal motor/sensory deficits  LABORATORY DATA:  I have reviewed the data as listed CBC Latest Ref Rng & Units 05/21/2021 05/20/2021 05/19/2021  WBC 4.0 - 10.5 K/uL 6.4 6.3 5.6  Hemoglobin 13.0 - 17.0 g/dL 12.4(L) 12.6(L) 12.6(L)  Hematocrit 39.0 - 52.0 % 36.3(L) 36.5(L) 36.7(L)  Platelets 150 - 400 K/uL 318 257 216    CMP Latest Ref Rng & Units 05/20/2021 05/19/2021 05/18/2021  Glucose 70 - 99 mg/dL 100(H) 88 78  BUN 8 - 23 mg/dL <5(L) <5(L) 5(L)  Creatinine 0.61 - 1.24 mg/dL 0.67 0.64 0.73  Sodium 135 - 145 mmol/L 130(L) 128(L) 131(L)  Potassium 3.5 - 5.1 mmol/L 3.4(L) 3.5 3.7  Chloride 98 - 111 mmol/L 96(L) 96(L) 97(L)  CO2 22 - 32 mmol/L _0 Calcium 8.9 - 10.3 mg/dL 7.9(L) 8.0(L) 8.0(L)  Total Protein 6.5 - 8.1 g/dL - 5.1(L) 5.2(L)  Total Bilirubin 0.3 - 1.2 mg/dL - 0.9 1.4(H)  Alkaline Phos 38 - 126 U/L - 85 88  AST 15  - 41 U/L - 25 30  ALT 0 - 44 U/L - 14 17     RADIOGRAPHIC STUDIES: I have personally reviewed the radiological images as listed and agreed with the findings in the report. DG Chest 1 View  Result Date: 05/12/2021 CLINICAL DATA:  The technologist did not provide a history. EXAM: CHEST  1 VIEW COMPARISON:  PA Lat 04/13/2021 FINDINGS: The heart size and mediastinal contours are within normal limits apart from mild aortic tortuosity. Both lungs are clear. The visualized skeletal structures are unremarkable aside from spinal  bridging enthesopathy of DISH. IMPRESSION: No evidence of acute chest disease or interval changes. Electronically Signed   By: Telford Nab M.D.   On: 05/12/2021 21:29   CT Angio Chest Pulmonary Embolism (PE) W or WO Contrast  Result Date: 05/12/2021 CLINICAL DATA:  Tremors and sepsis. EXAM: CT ANGIOGRAPHY CHEST WITH CONTRAST TECHNIQUE: Multidetector CT imaging of the chest was performed using the standard protocol during bolus administration of intravenous contrast. Multiplanar CT image reconstructions and MIPs were obtained to evaluate the vascular anatomy. CONTRAST:  110m OMNIPAQUE IOHEXOL 350 MG/ML SOLN COMPARISON:  Oct 20, 2015 FINDINGS: Cardiovascular: A small amount of intraluminal low attenuation is seen within a subsegmental lower lobe branch of the left pulmonary artery (axial CT images 151 through 158, CT series 5/coronal reformatted images 88 through 91, CT series 8). Normal heart size. No pericardial effusion. Mediastinum/Nodes: No enlarged mediastinal, hilar, or axillary lymph nodes. Thyroid gland, trachea, and esophagus demonstrate no significant findings. Lungs/Pleura: Mild atelectatic changes are seen within the posterolateral aspect of the left upper lobe. A 1.1 cm noncalcified lung nodule is seen within the posterior aspect of the left upper lobe (axial CT image 75, CT series 4). This represents a new finding when compared to the prior exam. There is no evidence of  a pleural effusion or pneumothorax. Upper Abdomen: No acute abnormality. Musculoskeletal: Multilevel degenerative changes seen throughout the thoracic spine. Review of the MIP images confirms the above findings. IMPRESSION: 1. Small amount of pulmonary embolism within a subsegmental lower lobe branch of the left pulmonary artery. 2. 1.1 cm noncalcified lung nodule within the posterior aspect of the left upper lobe. This represents a new finding when compared to the prior exam. Consider one of the following in 3 months for both low-risk and high-risk individuals: (a) repeat chest CT, (b) follow-up PET-CT, or (c) tissue sampling. This recommendation follows the consensus statement: Guidelines for Management of Incidental Pulmonary Nodules Detected on CT Images: From the Fleischner Society 2017; Radiology 2017; 284:228-243. Electronically Signed   By: TVirgina NorfolkM.D.   On: 05/12/2021 23:26   CT ABDOMEN PELVIS W CONTRAST  Result Date: 05/12/2021 CLINICAL DATA:  Abdominal pain and fevers EXAM: CT ABDOMEN AND PELVIS WITH CONTRAST TECHNIQUE: Multidetector CT imaging of the abdomen and pelvis was performed using the standard protocol following bolus administration of intravenous contrast. CONTRAST:  1064mOMNIPAQUE IOHEXOL 350 MG/ML SOLN COMPARISON:  04/03/2021 FINDINGS: Lower chest: No acute abnormality. Hepatobiliary: Gallbladder is partially distended. Significant Peri cholecystic inflammatory changes are noted with similar findings in the adjacent hepatic tissue. These changes have increased in the interval from the prior exam and again are highly suspicious for acute cholecystitis. Prominence of the common bile duct is again seen and stable. Pancreas: Pancreas appears within normal limits. Spleen: Normal in size without focal abnormality. Adrenals/Urinary Tract: Adrenal glands are within normal limits. Kidneys again demonstrate a enhancing mass lesion within the substance of the right kidney measuring up to  3.3 cm suspicious for renal cell carcinoma and stable in appearance from the prior exam. Urologic consultation is recommended. No obstructive changes are seen. Stable renal cysts are noted in the right kidney. The bladder is decompressed. Stomach/Bowel: Diverticular change of the colon is noted without evidence of diverticulitis. The appendix is barium filled and within normal limits. Small bowel and stomach are unremarkable. Vascular/Lymphatic: Atherosclerotic calcifications of the aorta are noted. Normal tapering is seen. No aneurysm is noted. Some upper abdominal peripancreatic lymph nodes are seen which appears centrally necrotic.  The overall appearance is stable from the prior study. Additional node located between the head of the pancreas and the left lobe of the liver is noted as well also stable in appearance. Persistent soft tissue density is noted surrounding proximal superior vena cava. Reproductive: Prostate appears within normal limits. Other: No abdominal wall hernia or abnormality. No abdominopelvic ascites. Musculoskeletal: Degenerative changes of lumbar spine are noted. IMPRESSION: Increase in the inflammatory changes surrounding the gallbladder and extending into the adjacent liver again suspicious for acute cholecystitis with localized hepatic inflammatory change. Correlate with pending ultrasound exam Upper abdominal lymphadenopathy which appears necrotic and stable in appearance from the prior exam. Patient is scheduled for PET-CT according to outpatient clinic notes. Stable enhancing right renal mass suspicious for renal cell carcinoma. By history the patient has met with urology. Remainder of the exam is stable from the prior study. Electronically Signed   By: Inez Catalina M.D.   On: 05/12/2021 23:34   MR 3D Recon At Scanner  Result Date: 05/13/2021 CLINICAL DATA:  Abdominal pain. Biliary obstruction suspected. Rigors. Nausea and vomiting. Diarrhea for 2 weeks. Weight loss. EXAM: MRI  ABDOMEN WITHOUT AND WITH CONTRAST (INCLUDING MRCP) TECHNIQUE: Multiplanar multisequence MR imaging of the abdomen was performed both before and after the administration of intravenous contrast. Heavily T2-weighted images of the biliary and pancreatic ducts were obtained, and three-dimensional MRCP images were rendered by post processing. CONTRAST:  7.15m GADAVIST GADOBUTROL 1 MMOL/ML IV SOLN COMPARISON:  Ultrasound and CT of 1 day prior. FINDINGS: Portions of exam are mild to moderately motion degraded. Lower chest: New right base airspace disease since yesterday's CT. Mild cardiomegaly, without pericardial or pleural effusion. Hepatobiliary: Dependent small gallstones. The gallbladder is contracted with apparent mild gallbladder wall thickening. Within the pericholecystic right liver lobe, a 3.5 x 3.2 cm centrally hypoenhancing and mildly peripherally hyperenhancing lesion is seen, including on 71/1104. Mildly T2 hypointense with mildly restricted diffusion. New since 04/03/2021. Mild intrahepatic biliary duct dilatation. The common duct is chronically dilated at 11 mm on 22/2. Followed to just above the level of the ampulla. On some images, including the dedicated MRCP sequence, a distal common duct filling defect is possible. Example at 6 mm 85/6. Pancreas: Pancreatic atrophy, without duct dilatation. Nonspecific edema within the left anterior pararenal space including on 18/4. Spleen:  Normal in size, without focal abnormality. Adrenals/Urinary Tract: Normal adrenal glands. Bilateral renal cysts. Arterially hyperenhancing interpolar right renal 2.8 x 2.6 cm lesion on 90/1101. No hydronephrosis. Stomach/Bowel: Grossly normal stomach and abdominal bowel loops. Vascular/Lymphatic:  Aortic atherosclerosis.  Patent renal veins. Again identified is necrotic adenopathy within the upper abdomen. Example gastrohepatic ligament node of 1.7 cm on 65/1102. A portacaval 11 mm necrotic node on 74/1103 Other:  No ascites.  Musculoskeletal: No acute osseous abnormality. IMPRESSION: 1. Motion degraded images. 2. Cholelithiasis. Suggestion of wall thickening in the setting of underdistention. Development of a pericholecystic liver lesion which is suspicious for extension of chronic cholecystitis. Direct extension of gallbladder carcinoma felt less likely, given absence of dominant gallbladder mass. 3. Upper abdominal necrotic adenopathy, present back to 04/03/2021. Outpatient PET may be informative to direct sampling. 4. Mild intra and extrahepatic biliary duct dilatation. Equivocal findings for distal common duct stone, given above limitations. If bilirubin is elevated, consider ERCP. 5. Interpolar right renal enhancing mass, consistent with renal cell carcinoma. 6. Mild edema within the anterior pararenal space. Correlate with pancreatic enzyme levels to exclude mild pancreatitis. 7. Right base airspace disease, new since yesterday's  CT. Favor atelectasis. Electronically Signed   By: Abigail Miyamoto M.D.   On: 05/13/2021 13:31   DG CHEST PORT 1 VIEW  Result Date: 05/21/2021 CLINICAL DATA:  Right-sided PICC placement. EXAM: PORTABLE CHEST 1 VIEW COMPARISON:  Chest radiograph dated 05/12/2021 and CT dated 05/12/2021. FINDINGS: Right-sided PICC with tip at the cavoatrial junction. There is cardiomegaly with mild vascular congestion. Bibasilar atelectasis. Developing infiltrate is less likely. No pleural effusion pneumothorax. No acute osseous pathology. IMPRESSION: Right-sided PICC with tip at the cavoatrial junction. Electronically Signed   By: Anner Crete M.D.   On: 05/21/2021 02:26   MR ABDOMEN MRCP W WO CONTAST  Result Date: 05/13/2021 CLINICAL DATA:  Abdominal pain. Biliary obstruction suspected. Rigors. Nausea and vomiting. Diarrhea for 2 weeks. Weight loss. EXAM: MRI ABDOMEN WITHOUT AND WITH CONTRAST (INCLUDING MRCP) TECHNIQUE: Multiplanar multisequence MR imaging of the abdomen was performed both before and after the  administration of intravenous contrast. Heavily T2-weighted images of the biliary and pancreatic ducts were obtained, and three-dimensional MRCP images were rendered by post processing. CONTRAST:  7.95m GADAVIST GADOBUTROL 1 MMOL/ML IV SOLN COMPARISON:  Ultrasound and CT of 1 day prior. FINDINGS: Portions of exam are mild to moderately motion degraded. Lower chest: New right base airspace disease since yesterday's CT. Mild cardiomegaly, without pericardial or pleural effusion. Hepatobiliary: Dependent small gallstones. The gallbladder is contracted with apparent mild gallbladder wall thickening. Within the pericholecystic right liver lobe, a 3.5 x 3.2 cm centrally hypoenhancing and mildly peripherally hyperenhancing lesion is seen, including on 71/1104. Mildly T2 hypointense with mildly restricted diffusion. New since 04/03/2021. Mild intrahepatic biliary duct dilatation. The common duct is chronically dilated at 11 mm on 22/2. Followed to just above the level of the ampulla. On some images, including the dedicated MRCP sequence, a distal common duct filling defect is possible. Example at 6 mm 85/6. Pancreas: Pancreatic atrophy, without duct dilatation. Nonspecific edema within the left anterior pararenal space including on 18/4. Spleen:  Normal in size, without focal abnormality. Adrenals/Urinary Tract: Normal adrenal glands. Bilateral renal cysts. Arterially hyperenhancing interpolar right renal 2.8 x 2.6 cm lesion on 90/1101. No hydronephrosis. Stomach/Bowel: Grossly normal stomach and abdominal bowel loops. Vascular/Lymphatic:  Aortic atherosclerosis.  Patent renal veins. Again identified is necrotic adenopathy within the upper abdomen. Example gastrohepatic ligament node of 1.7 cm on 65/1102. A portacaval 11 mm necrotic node on 74/1103 Other:  No ascites. Musculoskeletal: No acute osseous abnormality. IMPRESSION: 1. Motion degraded images. 2. Cholelithiasis. Suggestion of wall thickening in the setting of  underdistention. Development of a pericholecystic liver lesion which is suspicious for extension of chronic cholecystitis. Direct extension of gallbladder carcinoma felt less likely, given absence of dominant gallbladder mass. 3. Upper abdominal necrotic adenopathy, present back to 04/03/2021. Outpatient PET may be informative to direct sampling. 4. Mild intra and extrahepatic biliary duct dilatation. Equivocal findings for distal common duct stone, given above limitations. If bilirubin is elevated, consider ERCP. 5. Interpolar right renal enhancing mass, consistent with renal cell carcinoma. 6. Mild edema within the anterior pararenal space. Correlate with pancreatic enzyme levels to exclude mild pancreatitis. 7. Right base airspace disease, new since yesterday's CT. Favor atelectasis. Electronically Signed   By: KAbigail MiyamotoM.D.   On: 05/13/2021 13:31   ECHOCARDIOGRAM COMPLETE  Result Date: 05/13/2021    ECHOCARDIOGRAM REPORT   [facility] Date of Exam: 05/13/2021 Medical Rec #:  [id number]  Height:       71.0 in Accession #:  [id number]  Weight:       165.0 lb Date of Birth:  06/23/1940   BSA:          1.943 m Patient Age:    28 years    BP:           101/70 mmHg Patient Gender: M           HR:           74 bpm. Exam Location:  Inpatient Procedure: 2D Echo, Cardiac Doppler, Color Doppler and Intracardiac            Opacification Agent Indications:    Pulmonary emblous  History:        Patient has no prior history of Echocardiogram examinations.                 CAD, COPD; Risk Factors:Hypertension.  Sonographer:    Glo Herring Referring Phys: [id number] [place]  1. Left ventricular ejection fraction, by estimation, is 40 to 45%. The left ventricle has mildly decreased function. The left ventricle demonstrates global hypokinesis. Left ventricular diastolic parameters are indeterminate.  2. Right ventricular systolic function is normal. The right ventricular size is  mildly enlarged. Tricuspid regurgitation signal is inadequate for assessing PA pressure.  3. Right atrial size was mildly dilated.  4. The mitral valve is normal in structure. Trivial mitral valve regurgitation. No evidence of mitral stenosis.  5. The aortic valve is tricuspid. Aortic valve regurgitation is not visualized. No aortic stenosis is present.  6. The inferior vena cava is dilated in size with >50% respiratory variability, suggesting right atrial pressure of 8 mmHg. FINDINGS  Left Ventricle: Left ventricular ejection fraction, by estimation, is 40 to 45%. The left ventricle has mildly decreased function. The left ventricle demonstrates global hypokinesis. The left ventricular internal cavity size was normal in size. There is  no left ventricular hypertrophy. Left ventricular diastolic parameters are indeterminate. Right Ventricle: The right ventricular size is mildly enlarged. No increase in right ventricular wall thickness. Right ventricular systolic function is normal. Tricuspid regurgitation signal is inadequate for assessing PA pressure. Left Atrium: Left atrial size was normal in size. Right Atrium: Right atrial size was mildly dilated. Pericardium: There is no evidence of pericardial effusion. Mitral Valve: The mitral valve is normal in structure. Trivial mitral valve regurgitation. No evidence of mitral valve stenosis. Tricuspid Valve: The tricuspid valve is normal in structure. Tricuspid valve regurgitation is not demonstrated. Aortic Valve: The aortic valve is tricuspid. Aortic valve regurgitation is not visualized. No aortic stenosis is present. Aortic valve mean gradient measures 1.0 mmHg. Aortic valve peak gradient measures 2.7 mmHg. Aortic valve area, by VTI measures 3.05 cm. Pulmonic Valve: The pulmonic valve was not well visualized. Pulmonic valve regurgitation is not visualized. Aorta: The aortic root is normal in size and structure. Venous: The inferior vena cava is dilated in size with  greater than 50% respiratory variability, suggesting right atrial pressure of 8 mmHg. IAS/Shunts: The interatrial septum was not well visualized.  LEFT VENTRICLE PLAX 2D LVIDd:         4.70 cm      Diastology LVIDs:         3.00 cm      LV e' medial:    7.18 cm/s LV PW:         0.90 cm      LV E/e' medial:  8.4 LV IVS:        0.90 cm  LV e' lateral:   8.92 cm/s LVOT diam:     2.10 cm      LV E/e' lateral: 6.8 LV SV:         52 LV SV Index:   27 LVOT Area:     3.46 cm  LV Volumes (MOD) LV vol d, MOD A2C: 117.0 ml LV vol d, MOD A4C: 129.0 ml LV vol s, MOD A2C: 73.4 ml LV vol s, MOD A4C: 81.0 ml LV SV MOD A2C:     43.6 ml LV SV MOD A4C:     129.0 ml LV SV MOD BP:      44.8 ml RIGHT VENTRICLE             IVC RV Basal diam:  4.20 cm     IVC diam: 2.40 cm RV S prime:     11.90 cm/s LEFT ATRIUM           Index        RIGHT ATRIUM           Index LA diam:      2.60 cm 1.34 cm/m   RA Area:     23.40 cm LA Vol (A2C): 35.8 ml 18.42 ml/m  RA Volume:   74.30 ml  38.24 ml/m LA Vol (A4C): 58.1 ml 29.90 ml/m  AORTIC VALVE AV Area (Vmax):    3.12 cm AV Area (Vmean):   3.01 cm AV Area (VTI):     3.05 cm AV Vmax:           81.90 cm/s AV Vmean:          55.200 cm/s AV VTI:            0.169 m AV Peak Grad:      2.7 mmHg AV Mean Grad:      1.0 mmHg LVOT Vmax:         73.80 cm/s LVOT Vmean:        47.900 cm/s LVOT VTI:          0.149 m LVOT/AV VTI ratio: 0.88  AORTA Ao Root diam: 3.40 cm MITRAL VALVE MV Area (PHT): 3.83 cm    SHUNTS MV Decel Time: 198 msec    Systemic VTI:  0.15 m MV E velocity: 60.40 cm/s  Systemic Diam: 2.10 cm MV A velocity: 75.00 cm/s MV E/A ratio:  0.81 Oswaldo Milian MD Electronically signed by Oswaldo Milian MD Signature Date/Time: 05/13/2021/4:24:59 PM    Final    VAS [place] LOWER EXTREMITY VENOUS (DVT)  Result Date: 05/13/2021  Lower Venous DVT Study Patient Name:  [facility]  Date of Exam:   05/13/2021 Medical Rec #: [id number]    Accession #:    [id number] Date of Birth: 10-31-1940     Patient Gender: M Patient Age:   36 years Exam Location:  [facility] Procedure:      VAS [place] LOWER EXTREMITY VENOUS (DVT) Referring Phys: ASIA ZIERLE-GHOSH --------------------------------------------------------------------------------  Indications: Pulmonary embolism.  Comparison Study: No previous exams Performing Technologist: Jody Hill RVT, RDMS  Examination Guidelines: A complete evaluation includes B-mode imaging, spectral Doppler, color Doppler, and power Doppler as needed of all accessible portions of each vessel. Bilateral testing is considered an integral part of a complete examination. Limited examinations for reoccurring indications may be performed as noted. The reflux portion of the exam is performed with the patient in reverse Trendelenburg.  +---------+---------------+---------+-----------+----------+--------------+  RIGHT     Compressibility Phasicity Spontaneity Properties Thrombus Aging  +---------+---------------+---------+-----------+----------+--------------+  CFV       Full            Yes       Yes                                    +---------+---------------+---------+-----------+----------+--------------+  SFJ       Full                                                             +---------+---------------+---------+-----------+----------+--------------+  FV Prox   Full            Yes       Yes                                    +---------+---------------+---------+-----------+----------+--------------+  FV Mid    Full            Yes       Yes                                    +---------+---------------+---------+-----------+----------+--------------+  FV Distal Full            Yes       Yes                                    +---------+---------------+---------+-----------+----------+--------------+  PFV       Full                                                             +---------+---------------+---------+-----------+----------+--------------+  POP       Full            Yes        Yes                                    +---------+---------------+---------+-----------+----------+--------------+  PTV       Full                                                             +---------+---------------+---------+-----------+----------+--------------+  PERO      Full                                                             +---------+---------------+---------+-----------+----------+--------------+   +---------+---------------+---------+-----------+----------+--------------+  LEFT      Compressibility  Phasicity Spontaneity Properties Thrombus Aging  +---------+---------------+---------+-----------+----------+--------------+  CFV       Full            Yes       Yes                                    +---------+---------------+---------+-----------+----------+--------------+  SFJ       Full                                                             +---------+---------------+---------+-----------+----------+--------------+  FV Prox   Full            Yes       Yes                                    +---------+---------------+---------+-----------+----------+--------------+  FV Mid    Full            Yes       Yes                                    +---------+---------------+---------+-----------+----------+--------------+  FV Distal Full            Yes       Yes                                    +---------+---------------+---------+-----------+----------+--------------+  PFV       Full                                                             +---------+---------------+---------+-----------+----------+--------------+  POP       Full            Yes       Yes                                    +---------+---------------+---------+-----------+----------+--------------+  PTV       Full                                                             +---------+---------------+---------+-----------+----------+--------------+  PERO      Full                                                              +---------+---------------+---------+-----------+----------+--------------+  Summary: RIGHT: - There is no evidence of deep vein thrombosis in the lower extremity.  LEFT: - There is no evidence of deep vein thrombosis in the lower extremity.  *See table(s) above for measurements and observations. Electronically signed by Deitra Mayo MD on 05/13/2021 at 11:58:47 AM.    Final    [place] EKG SITE RITE  Result Date: 05/20/2021 If Site Rite image not attached, placement could not be confirmed due to current cardiac rhythm.  US Abdomen Limited RUQ (LIVER/GB)  Result Date: 05/12/2021 CLINICAL DATA:  Fevers EXAM: ULTRASOUND ABDOMEN LIMITED RIGHT UPPER QUADRANT COMPARISON:  CT from earlier in the same day FINDINGS: Gallbladder: Gallbladder again shows evidence of cholelithiasis with gallbladder wall thickening and mild pericholecystic fluid. Negative sonographic Murphy's sign is elicited however. The degree of inflammatory change in the adjacent liver is less well appreciated on this exam than on recent CT. Common bile duct: Diameter: 6.4 mm Liver: No focal lesion identified. Within normal limits in parenchymal echogenicity. Portal vein is patent on color Doppler imaging with normal direction of blood flow towards the liver. Other: None. IMPRESSION: Stable changes in the gallbladder with cholelithiasis and gallbladder wall thickening. Negative sonographic Murphy's sign is elicited however. This may be in part due to underlying medications. Electronically Signed   By: Inez Catalina M.D.   On: 05/12/2021 23:36     Orders Placed This Encounter  Procedures   NM PET Image Initial (PI) Skull Base To Thigh    Standing Status:   Future    Standing Expiration Date:   05/26/2022    Order Specific Question:   If indicated for the ordered procedure, I authorize the administration of a radiopharmaceutical per Radiology protocol    Answer:   Yes    Order Specific Question:   Preferred imaging location?    Answer:    [facility]    All questions were answered. The patient knows to call the clinic with any problems, questions or concerns. The total time spent in the appointment was 60 minutes.     Truitt Merle, MD 05/26/2021   I, Wilburn Mylar, am acting as scribe for Truitt Merle, MD.   I have reviewed the above documentation for accuracy and completeness, and I agree with the above.

## 2021-05-27 ENCOUNTER — Other Ambulatory Visit: Payer: Self-pay

## 2021-05-27 ENCOUNTER — Telehealth: Payer: Self-pay | Admitting: Hematology

## 2021-05-27 DIAGNOSIS — C787 Secondary malignant neoplasm of liver and intrahepatic bile duct: Secondary | ICD-10-CM

## 2021-05-27 NOTE — Progress Notes (Signed)
Andre [name] returned my call and requested I leave a detailed message if she did not answer her phone.  I left a vm with location, date, time, and instructions for upcoming PET scan scheduled for 06/07/2021.

## 2021-05-27 NOTE — Telephone Encounter (Signed)
Scheduled per sch msg. Called and left msg  

## 2021-05-27 NOTE — Progress Notes (Signed)
Opened in error

## 2021-05-27 NOTE — Progress Notes (Signed)
I left Andre [name] a vm regarding upcoming lab and Dr Burr Medico f/u appt.

## 2021-05-28 DIAGNOSIS — K810 Acute cholecystitis: Secondary | ICD-10-CM | POA: Diagnosis not present

## 2021-05-28 DIAGNOSIS — A419 Sepsis, unspecified organism: Secondary | ICD-10-CM | POA: Diagnosis not present

## 2021-05-28 DIAGNOSIS — I2699 Other pulmonary embolism without acute cor pulmonale: Secondary | ICD-10-CM | POA: Diagnosis not present

## 2021-05-30 ENCOUNTER — Ambulatory Visit: Admit: 2021-05-30 | Payer: Medicare Other | Admitting: Surgery

## 2021-05-30 SURGERY — LAPAROSCOPIC CHOLECYSTECTOMY
Anesthesia: General

## 2021-05-31 DIAGNOSIS — K828 Other specified diseases of gallbladder: Secondary | ICD-10-CM | POA: Diagnosis not present

## 2021-05-31 DIAGNOSIS — R590 Localized enlarged lymph nodes: Secondary | ICD-10-CM | POA: Diagnosis not present

## 2021-05-31 DIAGNOSIS — R634 Abnormal weight loss: Secondary | ICD-10-CM | POA: Diagnosis not present

## 2021-05-31 DIAGNOSIS — N2889 Other specified disorders of kidney and ureter: Secondary | ICD-10-CM | POA: Diagnosis not present

## 2021-06-01 ENCOUNTER — Other Ambulatory Visit: Payer: Self-pay

## 2021-06-01 DIAGNOSIS — I2699 Other pulmonary embolism without acute cor pulmonale: Secondary | ICD-10-CM | POA: Diagnosis not present

## 2021-06-01 DIAGNOSIS — A419 Sepsis, unspecified organism: Secondary | ICD-10-CM | POA: Diagnosis not present

## 2021-06-01 DIAGNOSIS — K810 Acute cholecystitis: Secondary | ICD-10-CM | POA: Diagnosis not present

## 2021-06-01 NOTE — Progress Notes (Signed)
The proposed treatment discussed in conference is for discussion purpose only and is not a binding recommendation.  The patients have not been physically examined, or presented with their treatment options.  Therefore, final treatment plans cannot be decided.  

## 2021-06-03 ENCOUNTER — Telehealth (HOSPITAL_COMMUNITY): Payer: Self-pay

## 2021-06-03 ENCOUNTER — Ambulatory Visit: Payer: Medicare Other | Admitting: Dietician

## 2021-06-03 ENCOUNTER — Other Ambulatory Visit (HOSPITAL_COMMUNITY): Payer: Self-pay

## 2021-06-03 NOTE — Telephone Encounter (Signed)
Transitions of Care Pharmacy  ° °Call attempted for a pharmacy transitions of care follow-up. HIPAA appropriate voicemail was left with call back information provided.  ° °Call attempt #1. Will follow-up in 2-3 days.  °  °

## 2021-06-03 NOTE — Progress Notes (Signed)
Patient did not show for scheduled nutrition appointment. Message sent to scheduling to contact patient and offer another appointment.

## 2021-06-04 DIAGNOSIS — K810 Acute cholecystitis: Secondary | ICD-10-CM | POA: Diagnosis not present

## 2021-06-04 DIAGNOSIS — A419 Sepsis, unspecified organism: Secondary | ICD-10-CM | POA: Diagnosis not present

## 2021-06-04 DIAGNOSIS — I2699 Other pulmonary embolism without acute cor pulmonale: Secondary | ICD-10-CM | POA: Diagnosis not present

## 2021-06-06 ENCOUNTER — Encounter (HOSPITAL_COMMUNITY): Payer: Self-pay | Admitting: Emergency Medicine

## 2021-06-06 ENCOUNTER — Other Ambulatory Visit: Payer: Self-pay

## 2021-06-06 ENCOUNTER — Inpatient Hospital Stay (HOSPITAL_COMMUNITY)
Admission: EM | Admit: 2021-06-06 | Discharge: 2021-06-08 | DRG: 193 | Disposition: A | Discharge status: Home / Self Care | Payer: Medicare Other | Attending: Internal Medicine | Admitting: Internal Medicine

## 2021-06-06 ENCOUNTER — Emergency Department (HOSPITAL_COMMUNITY): Payer: Medicare Other

## 2021-06-06 DIAGNOSIS — I2510 Atherosclerotic heart disease of native coronary artery without angina pectoris: Secondary | ICD-10-CM | POA: Diagnosis present

## 2021-06-06 DIAGNOSIS — K750 Abscess of liver: Secondary | ICD-10-CM | POA: Diagnosis not present

## 2021-06-06 DIAGNOSIS — E119 Type 2 diabetes mellitus without complications: Secondary | ICD-10-CM

## 2021-06-06 DIAGNOSIS — Z20822 Contact with and (suspected) exposure to covid-19: Secondary | ICD-10-CM | POA: Diagnosis not present

## 2021-06-06 DIAGNOSIS — Z809 Family history of malignant neoplasm, unspecified: Secondary | ICD-10-CM

## 2021-06-06 DIAGNOSIS — K219 Gastro-esophageal reflux disease without esophagitis: Secondary | ICD-10-CM | POA: Diagnosis not present

## 2021-06-06 DIAGNOSIS — Z452 Encounter for adjustment and management of vascular access device: Secondary | ICD-10-CM | POA: Diagnosis not present

## 2021-06-06 DIAGNOSIS — I517 Cardiomegaly: Secondary | ICD-10-CM | POA: Diagnosis not present

## 2021-06-06 DIAGNOSIS — K5730 Diverticulosis of large intestine without perforation or abscess without bleeding: Secondary | ICD-10-CM | POA: Diagnosis not present

## 2021-06-06 DIAGNOSIS — I5020 Unspecified systolic (congestive) heart failure: Secondary | ICD-10-CM | POA: Diagnosis present

## 2021-06-06 DIAGNOSIS — R079 Chest pain, unspecified: Secondary | ICD-10-CM | POA: Diagnosis not present

## 2021-06-06 DIAGNOSIS — C799 Secondary malignant neoplasm of unspecified site: Secondary | ICD-10-CM | POA: Diagnosis present

## 2021-06-06 DIAGNOSIS — Z7901 Long term (current) use of anticoagulants: Secondary | ICD-10-CM | POA: Diagnosis not present

## 2021-06-06 DIAGNOSIS — E785 Hyperlipidemia, unspecified: Secondary | ICD-10-CM | POA: Diagnosis present

## 2021-06-06 DIAGNOSIS — R0789 Other chest pain: Secondary | ICD-10-CM | POA: Diagnosis not present

## 2021-06-06 DIAGNOSIS — A4151 Sepsis due to Escherichia coli [E. coli]: Secondary | ICD-10-CM | POA: Diagnosis present

## 2021-06-06 DIAGNOSIS — I499 Cardiac arrhythmia, unspecified: Secondary | ICD-10-CM | POA: Diagnosis not present

## 2021-06-06 DIAGNOSIS — Z87891 Personal history of nicotine dependence: Secondary | ICD-10-CM

## 2021-06-06 DIAGNOSIS — Z8249 Family history of ischemic heart disease and other diseases of the circulatory system: Secondary | ICD-10-CM | POA: Diagnosis not present

## 2021-06-06 DIAGNOSIS — Z85048 Personal history of other malignant neoplasm of rectum, rectosigmoid junction, and anus: Secondary | ICD-10-CM | POA: Diagnosis not present

## 2021-06-06 DIAGNOSIS — C641 Malignant neoplasm of right kidney, except renal pelvis: Secondary | ICD-10-CM | POA: Diagnosis present

## 2021-06-06 DIAGNOSIS — Z79899 Other long term (current) drug therapy: Secondary | ICD-10-CM

## 2021-06-06 DIAGNOSIS — N289 Disorder of kidney and ureter, unspecified: Secondary | ICD-10-CM

## 2021-06-06 DIAGNOSIS — Z8673 Personal history of transient ischemic attack (TIA), and cerebral infarction without residual deficits: Secondary | ICD-10-CM | POA: Diagnosis not present

## 2021-06-06 DIAGNOSIS — R0602 Shortness of breath: Secondary | ICD-10-CM | POA: Diagnosis not present

## 2021-06-06 DIAGNOSIS — I471 Supraventricular tachycardia, unspecified: Secondary | ICD-10-CM | POA: Diagnosis present

## 2021-06-06 DIAGNOSIS — K7689 Other specified diseases of liver: Secondary | ICD-10-CM | POA: Diagnosis not present

## 2021-06-06 DIAGNOSIS — I2699 Other pulmonary embolism without acute cor pulmonale: Secondary | ICD-10-CM | POA: Diagnosis present

## 2021-06-06 DIAGNOSIS — R6889 Other general symptoms and signs: Secondary | ICD-10-CM | POA: Diagnosis not present

## 2021-06-06 DIAGNOSIS — I10 Essential (primary) hypertension: Secondary | ICD-10-CM | POA: Diagnosis not present

## 2021-06-06 DIAGNOSIS — I110 Hypertensive heart disease with heart failure: Secondary | ICD-10-CM | POA: Diagnosis not present

## 2021-06-06 DIAGNOSIS — J189 Pneumonia, unspecified organism: Secondary | ICD-10-CM | POA: Diagnosis present

## 2021-06-06 DIAGNOSIS — J432 Centrilobular emphysema: Secondary | ICD-10-CM | POA: Diagnosis not present

## 2021-06-06 DIAGNOSIS — R413 Other amnesia: Secondary | ICD-10-CM | POA: Diagnosis present

## 2021-06-06 DIAGNOSIS — K820 Obstruction of gallbladder: Secondary | ICD-10-CM | POA: Diagnosis not present

## 2021-06-06 DIAGNOSIS — I700 Atherosclerosis of aorta: Secondary | ICD-10-CM | POA: Diagnosis present

## 2021-06-06 DIAGNOSIS — Z743 Need for continuous supervision: Secondary | ICD-10-CM | POA: Diagnosis not present

## 2021-06-06 DIAGNOSIS — J449 Chronic obstructive pulmonary disease, unspecified: Secondary | ICD-10-CM | POA: Diagnosis present

## 2021-06-06 LAB — I-STAT CHEM 8, ED
BUN: 5 mg/dL — ABNORMAL LOW (ref 8–23)
Calcium, Ion: 1.21 mmol/L (ref 1.15–1.40)
Chloride: 100 mmol/L (ref 98–111)
Creatinine, Ser: 0.9 mg/dL (ref 0.61–1.24)
Glucose, Bld: 99 mg/dL (ref 70–99)
HCT: 43 % (ref 39.0–52.0)
Hemoglobin: 14.6 g/dL (ref 13.0–17.0)
Potassium: 4.1 mmol/L (ref 3.5–5.1)
Sodium: 140 mmol/L (ref 135–145)
TCO2: 33 mmol/L — ABNORMAL HIGH (ref 22–32)

## 2021-06-06 LAB — COMPREHENSIVE METABOLIC PANEL
ALT: 14 U/L (ref 0–44)
AST: 27 U/L (ref 15–41)
Albumin: 3 g/dL — ABNORMAL LOW (ref 3.5–5.0)
Alkaline Phosphatase: 140 U/L — ABNORMAL HIGH (ref 38–126)
Anion gap: 6 (ref 5–15)
BUN: 5 mg/dL — ABNORMAL LOW (ref 8–23)
CO2: 30 mmol/L (ref 22–32)
Calcium: 9 mg/dL (ref 8.9–10.3)
Chloride: 101 mmol/L (ref 98–111)
Creatinine, Ser: 1.03 mg/dL (ref 0.61–1.24)
GFR, Estimated: >60 mL/min (ref >60)
Glucose, Bld: 102 mg/dL — ABNORMAL HIGH (ref 70–99)
Potassium: 4 mmol/L (ref 3.5–5.1)
Sodium: 137 mmol/L (ref 135–145)
Total Bilirubin: 1.3 mg/dL — ABNORMAL HIGH (ref 0.3–1.2)
Total Protein: 6.7 g/dL (ref 6.5–8.1)

## 2021-06-06 LAB — CBC WITH DIFFERENTIAL/PLATELET
Abs Immature Granulocytes: 0.01 10*3/uL (ref 0.00–0.07)
Basophils Absolute: 0 10*3/uL (ref 0.0–0.1)
Basophils Relative: 1 %
Eosinophils Absolute: 0.1 10*3/uL (ref 0.0–0.5)
Eosinophils Relative: 2 %
HCT: 42.1 % (ref 39.0–52.0)
Hemoglobin: 13.4 g/dL (ref 13.0–17.0)
Immature Granulocytes: 0 %
Lymphocytes Relative: 47 %
Lymphs Abs: 2.8 10*3/uL (ref 0.7–4.0)
MCH: 29.1 pg (ref 26.0–34.0)
MCHC: 31.8 g/dL (ref 30.0–36.0)
MCV: 91.3 fL (ref 80.0–100.0)
Monocytes Absolute: 0.7 10*3/uL (ref 0.1–1.0)
Monocytes Relative: 12 %
Neutro Abs: 2.3 10*3/uL (ref 1.7–7.7)
Neutrophils Relative %: 38 %
Platelets: 264 10*3/uL (ref 150–400)
RBC: 4.61 MIL/uL (ref 4.22–5.81)
RDW: 16.7 % — ABNORMAL HIGH (ref 11.5–15.5)
WBC: 5.9 10*3/uL (ref 4.0–10.5)
nRBC: 0 % (ref 0.0–0.2)

## 2021-06-06 LAB — RESP PANEL BY RT-PCR (FLU A&B, COVID) ARPGX2
Influenza A by PCR: NEGATIVE
Influenza B by PCR: NEGATIVE
SARS Coronavirus 2 by RT PCR: NEGATIVE

## 2021-06-06 LAB — TROPONIN I (HIGH SENSITIVITY): Troponin I (High Sensitivity): 196 ng/L (ref <18)

## 2021-06-06 LAB — LIPASE, BLOOD: Lipase: 32 U/L (ref 11–51)

## 2021-06-06 LAB — LACTIC ACID, PLASMA: Lactic Acid, Venous: 1.3 mmol/L (ref 0.5–1.9)

## 2021-06-06 LAB — MAGNESIUM: Magnesium: 2 mg/dL (ref 1.7–2.4)

## 2021-06-06 LAB — PROCALCITONIN: Procalcitonin: <0.1 ng/mL

## 2021-06-06 LAB — TSH: TSH: 0.648 u[IU]/mL (ref 0.350–4.500)

## 2021-06-06 MED ORDER — SODIUM CHLORIDE 0.9 % IV SOLN
2.0000 g | Freq: Three times a day (TID) | INTRAVENOUS | Status: DC
  Administered 2021-06-06 – 2021-06-08 (×6): 2 g via INTRAVENOUS
  Filled 2021-06-06 (×7): qty 2

## 2021-06-06 MED ORDER — METRONIDAZOLE 500 MG/100ML IV SOLN
500.0000 mg | Freq: Once | INTRAVENOUS | Status: AC
  Administered 2021-06-06: 16:00:00 500 mg via INTRAVENOUS
  Filled 2021-06-06: qty 100

## 2021-06-06 MED ORDER — SODIUM CHLORIDE 0.9 % IV SOLN: 250.0000 mL | INTRAVENOUS | Status: DC | PRN

## 2021-06-06 MED ORDER — METRONIDAZOLE 500 MG PO TABS: 500.0000 mg | ORAL_TABLET | Freq: Two times a day (BID) | ORAL | Status: DC

## 2021-06-06 MED ORDER — METRONIDAZOLE 500 MG PO TABS
500.0000 mg | ORAL_TABLET | Freq: Two times a day (BID) | ORAL | Status: DC
  Administered 2021-06-07 – 2021-06-08 (×3): 500 mg via ORAL
  Filled 2021-06-06 (×3): qty 1

## 2021-06-06 MED ORDER — DICLOFENAC SODIUM 1 % EX GEL: 2.0000 g | Freq: Four times a day (QID) | CUTANEOUS | Status: DC | PRN

## 2021-06-06 MED ORDER — SODIUM CHLORIDE 0.9% FLUSH: 3.0000 mL | INTRAVENOUS | Status: DC | PRN

## 2021-06-06 MED ORDER — SODIUM CHLORIDE 0.9% FLUSH
3.0000 mL | Freq: Two times a day (BID) | INTRAVENOUS | Status: DC
  Administered 2021-06-06: 22:00:00 3 mL via INTRAVENOUS

## 2021-06-06 MED ORDER — SUCRALFATE 1 G PO TABS
1.0000 g | ORAL_TABLET | Freq: Four times a day (QID) | ORAL | Status: DC
  Administered 2021-06-06 – 2021-06-08 (×6): 1 g via ORAL
  Filled 2021-06-06 (×6): qty 1

## 2021-06-06 MED ORDER — FENTANYL CITRATE PF 50 MCG/ML IJ SOSY
25.0000 ug | PREFILLED_SYRINGE | Freq: Once | INTRAMUSCULAR | Status: AC
  Administered 2021-06-06: 14:00:00 25 ug via INTRAVENOUS
  Filled 2021-06-06: qty 1

## 2021-06-06 MED ORDER — SODIUM CHLORIDE 0.9 % IV BOLUS
1000.0000 mL | Freq: Once | INTRAVENOUS | Status: AC
  Administered 2021-06-06: 14:00:00 1000 mL via INTRAVENOUS

## 2021-06-06 MED ORDER — PANTOPRAZOLE SODIUM 40 MG PO TBEC
40.0000 mg | DELAYED_RELEASE_TABLET | Freq: Every day | ORAL | Status: DC
  Administered 2021-06-07 – 2021-06-08 (×2): 40 mg via ORAL
  Filled 2021-06-06 (×2): qty 1

## 2021-06-06 MED ORDER — ASPIRIN EC 81 MG PO TBEC
81.0000 mg | DELAYED_RELEASE_TABLET | Freq: Every day | ORAL | Status: DC
  Administered 2021-06-06 – 2021-06-08 (×3): 81 mg via ORAL
  Filled 2021-06-06 (×3): qty 1

## 2021-06-06 MED ORDER — APIXABAN 5 MG PO TABS
5.0000 mg | ORAL_TABLET | Freq: Two times a day (BID) | ORAL | Status: DC
  Administered 2021-06-06 – 2021-06-08 (×4): 5 mg via ORAL
  Filled 2021-06-06 (×4): qty 1

## 2021-06-06 MED ORDER — AMLODIPINE BESYLATE 5 MG PO TABS
2.5000 mg | ORAL_TABLET | Freq: Every day | ORAL | Status: DC
  Administered 2021-06-07: 10:00:00 2.5 mg via ORAL
  Filled 2021-06-06 (×2): qty 1

## 2021-06-06 MED ORDER — HYDROCODONE-ACETAMINOPHEN 10-325 MG PO TABS
1.0000 | ORAL_TABLET | Freq: Four times a day (QID) | ORAL | Status: DC | PRN
  Administered 2021-06-07 (×2): 1 via ORAL
  Filled 2021-06-06 (×2): qty 1

## 2021-06-06 MED ORDER — ACETAMINOPHEN 325 MG PO TABS: 650.0000 mg | ORAL_TABLET | Freq: Four times a day (QID) | ORAL | Status: DC | PRN

## 2021-06-06 MED ORDER — ACETAMINOPHEN 650 MG RE SUPP: 650.0000 mg | Freq: Four times a day (QID) | RECTAL | Status: DC | PRN

## 2021-06-06 MED ORDER — ADENOSINE 6 MG/2ML IV SOLN
12.0000 mg | Freq: Once | INTRAVENOUS | Status: AC
  Administered 2021-06-06: 14:00:00 12 mg via INTRAVENOUS
  Filled 2021-06-06: qty 4

## 2021-06-06 MED ORDER — IOHEXOL 350 MG/ML SOLN
90.0000 mL | Freq: Once | INTRAVENOUS | Status: AC | PRN
  Administered 2021-06-06: 14:00:00 90 mL via INTRAVENOUS

## 2021-06-06 MED ORDER — APIXABAN (ELIQUIS) VTE STARTER PACK (10MG AND 5MG): 5.0000 mg | ORAL_TABLET | Freq: Two times a day (BID) | ORAL | Status: DC

## 2021-06-06 MED ORDER — MIRTAZAPINE 15 MG PO TABS
7.5000 mg | ORAL_TABLET | Freq: Every day | ORAL | Status: DC
  Administered 2021-06-07 – 2021-06-08 (×2): 7.5 mg via ORAL
  Filled 2021-06-06 (×4): qty 1

## 2021-06-06 NOTE — Assessment & Plan Note (Addendum)
Suspicion for malignancy-underwent EUS with FNA of lymph nodes-preliminary biopsy positive for metastatic adenocarcinoma.  Suspicion that this may be gallbladder in origin.  has already seen general surgery where it was discussed at tumor board and felt to be gallbladder in origin. Has seen oncology and is supposed to have a PET scan tomorrow.   -also concerns for liver abscess on last hospitalization with bacteremia. Continue flagyl and rocephin changed to cefepime. Last dose of medication 12/290/22 for 4 week course.

## 2021-06-06 NOTE — ED Triage Notes (Signed)
Pt arrives via EMS from home with CP and SOB that began at 12pm. Pt was sitting on the couch when pain began , reported SOB with the pain. Initally EKG SVT 210, bp 70/40, reports current treatment for septic call bladder. Picc line in right arm. 20g LAC. Currently SVT rate 175 after 525ml NS. Pt alert, oriented, GCS 15. Last bp 104/70.

## 2021-06-06 NOTE — Assessment & Plan Note (Signed)
-  s/p resection 08/23/07 showing no residual neoplasm.

## 2021-06-06 NOTE — Assessment & Plan Note (Signed)
Well controlled. Lat A1C in October was 6.4 Continue lifestyle and diet modification.  SSI while in hospital

## 2021-06-06 NOTE — Assessment & Plan Note (Addendum)
Statin held on last discharge, continue to hold with cancer w/u.

## 2021-06-06 NOTE — Assessment & Plan Note (Addendum)
Concern for malignancy-outpatient follow up Has f/u with Dr Luvenia Heller

## 2021-06-06 NOTE — Progress Notes (Signed)
Pharmacy Antibiotic Note  Andre [name] is a 80 y.o. male admitted on 06/06/2021 with bacteremia.  Pharmacy has been consulted for cefepime dosing.  Patient with a history of  coronary artery disease, GERD, hypertension, rectal cancer, TIA. Patient presenting with CP and SOB.  Patient recently treated inpatient for sepsis secondary to E. coli bacteremia (Amp / Amp sulbact resistant). ID recommended rocephin and flagyl until 12/28 (4 weeks).  SCr 0.9 - at baseline WBC 5.9  Plan: Metronidazole per MD Cefepime 2g q8hr Trend WBC, Fever, Renal function, & Clinical course F/u cultures, clinical course, WBC, fever De-escalate when able F/u ID recommendations     Temp (24hrs), Avg:97.8 F (36.6 C), Min:97.8 F (36.6 C), Max:97.8 F (36.6 C)  Recent Labs  Lab 06/06/21 1338 06/06/21 1356  WBC 5.9  --   CREATININE 1.03 0.90    Estimated Creatinine Clearance: 64.4 mL/min (by C-G formula based on SCr of 0.9 mg/dL).    No Known Allergies  Antimicrobials this admission: Zosyn 12/2 >>12/3 CTX 12/3 >> 12/26 Cefepime 12/26 >> Flagyl 12/3>> [?12/29]  Microbiology results: Results this encounter - pending  Previous results: 12/1 blood: E coli, sens all except R to Amp and Unasyn 12/2 urine: neg 12/1 COVID and flu: neg 12/5 Quantiferon gold: negative 12/5 HIV and RPR: negative  Thank you for allowing pharmacy to be a part of this patients care.  Lorelei Pont, PharmD, BCPS 06/06/2021 3:13 PM ED Clinical Pharmacist -  [phone]

## 2021-06-06 NOTE — H&P (Addendum)
History and Physical    Andre [name] YQM:[id number] DOB: Jun 21, 1940 DOA: 06/06/2021  PCP: Biagio Borg, MD Consultants:  oncology: Dr. Burr Medico, neurology: dr. Krista Blue Patient coming from:  Home - lives with a roommate   Chief Complaint: right pleuritic pain   HPI: Andre [name] is a 80 y.o. 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 Pain sharp, rated as a 10/10. Pain constant and still present. Nothing made it better or worse. No radiation. He denies any coughing or fever. He didn't take any medication. The pain was so bad he came to ED. Pain is much better.   Recently admitted 12/1-12/9/22 for sepsis secondary to e.coli bacteremia with concern that he may have underlying malignancy. He was treated with rocephin and flagyl with recommended 4 weeks of IV rocephin and oral flagyl. Liver biopsy contemplated, but deffered and felt more appropriate after completion of 4 weeks of abx. Also found to have PE and discharged on eliquis. Also had right renal lesion concerning for malignancy. He has been taking abx as prescribed.   He denies any fever/chills, headaches/vision changes, chest pain or palpitations, shorntess of breath or cough. Stomach pain in right lower quadrant unchanged. No N/V/D. No dysuria or urgency or frequency. No leg swelling.   ED Course: vitals: afebrile, bp: 136/78, HR: 69, RR: 20, oxygen: 91%. When arrived in SVT to rate of 195. Given adenosine and HR in 80-90.  Pertinent labs: alkaline phosphatase: 140,  CT angio chest/abdo/pelvis for dissection: Redemonstrated contracted, heterogeneous gallbladder with an adjacent hypodense lesion of the right lobe of the liver Scattered and ground glass and heterogeneous airspace opacities in dependent lungs, nonspecific and  most likely infectious or inflammatory. Other findings as described by previous imaging.  In ED given flagyl and cefepime,also in SVT and given adenosine. 1L bolus.  tRH was asked to admit.   Review of Systems: As per HPI; otherwise review of systems reviewed and negative.   Ambulatory Status:  Ambulates without assistance   Past Medical History:  Diagnosis Date   Arthritis    Coronary artery disease, non-occlusive    a. 06/2011: cath for abnormal exercise echo: Only 30-40% proximal RCA. Otherwise normal. b. 10/2015: cath for abnormal NST and atypical CP --> showed mild 30-40% stenosis in the prox-distal RCA   GERD (gastroesophageal reflux disease)    HA (headache)    Hypertension    Memory loss    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[place])    TIA (transient ischemic attack)    Weakness     Past Surgical History:  Procedure Laterality Date   CARDIAC CATHETERIZATION  January 2013   Proximal RCA 30-40%. Otherwise normal.   CARDIAC CATHETERIZATION N/A 10/18/2015   Procedure: Left Heart Cath and Coronary Angiography;  Surgeon: Jettie Booze, MD;  Location: [facility];  Service: Cardiovascular;  Laterality: N/A;   ESOPHAGOGASTRODUODENOSCOPY (EGD) WITH PROPOFOL N/A 05/18/2021   Procedure: ESOPHAGOGASTRODUODENOSCOPY (EGD) WITH PROPOFOL;  Surgeon: Carol Ada, MD;  Location: [facility];  Service: Endoscopy;  Laterality: N/A;   EUS Left 05/18/2021   Procedure: UPPER ENDOSCOPIC ULTRASOUND (EUS) LINEAR;  Surgeon: Carol Ada, MD;  Location: [facility];  Service: Endoscopy;  Laterality: Left;   FINE NEEDLE ASPIRATION  05/18/2021   Procedure: FINE NEEDLE ASPIRATION (FNA) LINEAR;  Surgeon: Carol Ada, MD;  Location: [facility]  ENDOSCOPY;  Service: Endoscopy;;   NM MYOVIEW LTD  March 2015   LOW RISK. No scar or ischemia. Normal EF (55%). No RWMA   surgical excision of rectal cancer      Social History   Socioeconomic History   Marital status: Single    Spouse name: Not on file   Number of children:  71   Years of education: 12   Highest education level: Not on file  Occupational History   Occupation: Retried    Comment: retired  Tobacco Use   Smoking status: Former    Packs/day: 0.50    Years: 50.00    Pack years: 25.00    Types: Cigarettes    Quit date: 05/01/2021    Years since quitting: 0.0   Smokeless tobacco: Never  Vaping Use   Vaping Use: Never used  Substance and Sexual Activity   Alcohol use: Yes    Alcohol/week: 7.0 standard drinks    Types: 7 Shots of liquor per week    Comment: stopped in 04/2021   Drug use: No   Sexual activity: Not on file  Other Topics Concern   Not on file  Social History Narrative   Patient is single, retired. Lives alone.   Patient is right handed   Education level is high school   Caffeine consumption is 1 cup daily   Social Determinants of Health   Financial Resource Strain: Low Risk    Difficulty of Paying Living Expenses: Not hard at all  Food Insecurity: No Food Insecurity   Worried About Charity fundraiser in the Last Year: Never true   Arboriculturist in the Last Year: Never true  Transportation Needs: No Transportation Needs   Lack of Transportation (Medical): No   Lack of Transportation (Non-Medical): No  Physical Activity: Sufficiently Active   Days of Exercise per Week: 5 days   Minutes of Exercise per Session: 30 min  Stress: No Stress Concern Present   Feeling of Stress : Not at all  Social Connections: Moderately Integrated   Frequency of Communication with Friends and Family: More than three times a week   Frequency of Social Gatherings with Friends and Family: More than three times a week   Attends Religious Services: 1 to 4 times per year   Active Member of [employer] or Organizations: Yes   Attends Music therapist: More than 4 times per year   Marital Status: Separated  Intimate Partner Violence: Not on file    No Known Allergies  Family History  Problem Relation Age of Onset    Hypertension Mother    Cancer Mother        unknown type   Cancer Father        unknown type cancer   Liver disease Father     Prior to Admission medications   Medication Sig Start Date End Date Taking? Authorizing Provider  amLODipine (NORVASC) 2.5 MG tablet Take 2.5 mg by mouth daily. 03/02/21   [provider]  APIXABAN Arne Cleveland) VTE STARTER PACK (10MG AND 5MG) Take as directed on package: start with two-45m tablets twice daily for 7 days. On day 8, switch to one-583mtablet twice daily. 05/20/21   Ghimire, ShHenreitta LeberMD  cefTRIAXone (ROCEPHIN) IVPB Inject 2 g into the vein daily for 20 days. Indication:  E.coli intra-abd infection First Dose: Yes Last Day of Therapy:  06/09/21 Labs - Once weekly:  CBC/D and BMP, Labs - Every other week:  ESR and CRP Method of administration: IV Push Method of administration may be changed at the discretion of home infusion pharmacist based upon assessment of the patient and/or caregiver's ability to self-administer the medication ordered. 05/20/21 06/09/21  Ghimire, Henreitta Leber, MD  metroNIDAZOLE (FLAGYL) 500 MG tablet Take 1 tablet (500 mg total) by mouth 2 (two) times daily for 20 days. Stop date 06/09/21 05/20/21 06/09/21  Jonetta Osgood, MD  mirtazapine (REMERON) 7.5 MG tablet Take 1 tablet (7.5 mg total) by mouth at bedtime. 05/26/21   Truitt Merle, MD  pantoprazole (PROTONIX) 40 MG tablet Take 1 tablet (40 mg total) by mouth daily. 05/20/21 05/20/22  Ghimire, Henreitta Leber, MD  sucralfate (CARAFATE) 1 g tablet Take 1 tablet (1 g total) by mouth 4 (four) times daily. 03/28/21   Biagio Borg, MD    Physical Exam: Vitals:   06/06/21 1629 06/06/21 1730 06/06/21 1830 06/06/21 1930  BP:  114/78 116/73 125/80  Pulse:   75   Resp:  15 15 17   Temp:      TempSrc:      SpO2: 97%  96%      General:  Appears calm and comfortable and is in NAD Eyes:  PERRL, EOMI, normal lids, iris ENT:  grossly normal hearing, lips & tongue, mmm; appropriate  dentition Neck:  no LAD, masses or thyromegaly; no carotid bruits Cardiovascular:  RRR, no m/r/g. No LE edema.  Respiratory:   CTA bilaterally with no wheezes/rales/rhonchi.  Normal respiratory effort. Abdomen:  soft, NT, ND, NABS Back:   normal alignment, no CVAT Skin:  no rash or induration seen on limited exam Musculoskeletal:  grossly normal tone BUE/BLE, good ROM, no bony abnormality Lower extremity:  No LE edema.  Limited foot exam with no ulcerations.  2+ distal pulses. Psychiatric:  grossly normal mood and affect, speech fluent and appropriate, AOx3 Neurologic:  CN 2-12 grossly intact, moves all extremities in coordinated fashion, sensation intact    Radiological Exams on Admission: Independently reviewed - see discussion in A/P where applicable  CT Angio Chest/Abd/Pel for Dissection W and/or Wo Contrast  Result Date: 06/06/2021 CLINICAL DATA:  Chest and back pain, shortness of breath, aortic dissection suspected EXAM: CT ANGIOGRAPHY CHEST, ABDOMEN AND PELVIS TECHNIQUE: Non-contrast CT of the chest was initially obtained. Multidetector CT imaging through the chest, abdomen and pelvis was performed using the standard protocol during bolus administration of intravenous contrast. Multiplanar reconstructed images and MIPs were obtained and reviewed to evaluate the vascular anatomy. CONTRAST:  43m OMNIPAQUE IOHEXOL 350 MG/ML SOLN COMPARISON:  CT chest angiogram, CT abdomen pelvis, 05/12/2021, MR abdomen, 05/13/2021 FINDINGS: CTA CHEST FINDINGS Cardiovascular: Preferential opacification of the thoracic aorta. Normal contour and caliber of the thoracic aorta without evidence of aneurysm, dissection, or other acute aortic pathology. Moderate mixed calcific atherosclerosis of the arch and descending thoracic aorta. Cardiomegaly. Three-vessel coronary artery calcifications. No pericardial effusion. Right upper extremity PICC. Mediastinum/Nodes: No enlarged mediastinal, hilar, or axillary lymph  nodes. Thyroid gland, trachea, and esophagus demonstrate no significant findings. Lungs/Pleura: Mild centrilobular emphysema. Scattered ground-glass and heterogeneous airspace opacities in the dependent lungs (series 6, image 36). No pleural effusion or pneumothorax. Musculoskeletal: No chest wall abnormality. No acute or significant osseous findings. Review of the MIP images confirms the above findings. CTA ABDOMEN AND PELVIS FINDINGS VASCULAR Normal contour and caliber of the abdominal aorta. No evidence of aneurysm, dissection, or other acute aortic pathology. Moderate to severe mixed calcific atherosclerosis. Duplicated right renal arteries, with a small  accessory inferior pole right renal artery, and a solitary left renal artery origin with early branching of renal arterioles. Otherwise standard branching pattern of the abdominal aorta. Atherosclerosis at the branch vessel origins without significant stenosis. Review of the MIP images confirms the above findings. NON-VASCULAR Hepatobiliary: Redemonstrated contracted, heterogeneous gallbladder with an adjacent hypodense lesion of the right lobe of the liver (series 7, image 169). Pancreas: Unremarkable. No pancreatic ductal dilatation or surrounding inflammatory changes. Spleen: Normal in size without significant abnormality. Adrenals/Urinary Tract: Adrenal glands are unremarkable. Interpolar mass of the midportion of the right kidney again noted, better appreciated by prior MR (series 7, image 185). Bladder is unremarkable. Stomach/Bowel: Stomach is within normal limits. Appendix appears normal. No evidence of bowel wall thickening, distention, or inflammatory changes. Colon is fluid-filled to the rectum. Descending and sigmoid diverticulosis. Lymphatic: No enlarged abdominal or pelvic lymph nodes. Reproductive: No mass or other significant abnormality. Other: No abdominal wall hernia or abnormality. No abdominopelvic ascites. Musculoskeletal: No acute or  significant osseous findings. Review of the MIP images confirms the above findings. IMPRESSION: 1. Normal contour and caliber of the thoracic and abdominal aorta without evidence of aneurysm, dissection, or other acute aortic pathology. Moderate to severe mixed calcific atherosclerosis. 2. Scattered ground-glass and heterogeneous airspace opacities in the dependent lungs, nonspecific and most likely infectious or inflammatory. Given concern for new pulmonary nodule raised on prior CT angiogram, recommend follow-up CT in 3 months to ensure resolution. 3. Mild centrilobular emphysema. 4. Redemonstrated contracted, heterogeneous gallbladder with an adjacent hypodense lesion of the right lobe of the liver, poorly assessed on this early arterial phase CT and better characterized by prior MR. 5. Interpolar mass of the midportion of the right kidney again noted, in keeping with renal cell carcinoma again better characterized by prior MR. 6. Colon is fluid-filled to the rectum, suggestive of diarrheal illness. 7. Coronary artery disease. Aortic Atherosclerosis (ICD10-I70.0) and Emphysema (ICD10-J43.9). Electronically Signed   By: Delanna Ahmadi M.D.   On: 06/06/2021 14:39    EKG: Independently reviewed.  SVT with rate 195; nonspecific ST changes with no evidence of acute ischemia. P waves with no changes.   Post adenosine: NSR rate of 98   Labs on Admission: I have personally reviewed the available labs and imaging studies at the time of the admission.  Pertinent labs:  Alk phos: 140   Assessment/Plan Pneumonia 80 year old with right pleuritic chest pain with findings on CT concerning for possible pneumonia vs. inflammation. Recently admitted for sepsis with e.coli bacteremia and concerns for liver abscess -place on observation -differential includes MSK vs. Pneumonia vs. Viral process vs. Inflammation  -afebrile with no increased wbc -check blood cultures/lactic acid -change rocephin to cefepime,  continue flagyl for liver abscess last day 06/09/21.  -check procalcitonin  -covid/flu pending  SVT (supraventricular tachycardia) (HCC) -found to be in SVT on arrival, patient asymptomatic.  -given adenosine with rate control  -no change in p wave morphology -working up pneumonia, check troponin/UDS -place on telemetry  Pulmonary embolism ([place]) Compliant with eliquis. 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  Suspicion that this may be gallbladder in origin.  has already seen general surgery where it was discussed at tumor board and felt to be gallbladder in origin. Has seen oncology and is supposed to  have a PET scan tomorrow.   -also concerns for liver abscess on last hospitalization with bacteremia. Continue flagyl and rocephin changed to cefepime. Last dose of medication 12/290/22 for 4 week course.   HFrEF (heart failure with reduced ejection fraction) ([place]) Euvolemic Monitor I/O Echo: 05/13/21: Ef of 40-45% with mildly decreased LV function and global hypokinesis. Indeterminate diastotlic parameters.   Type 2 diabetes mellitus without complication (HCC) Well controlled. Lat A1C in October was 6.4 Continue lifestyle and diet modification.  SSI while in hospital   Renal lesion Concern for malignancy-outpatient follow up Has f/u with Dr Luvenia Heller   GERD (gastroesophageal reflux disease) Continue protonix/carafate   Hyperlipidemia Statin held on last discharge, continue to hold with cancer w/u.   Essential hypertension, benign Extremely well controlled, continue low dose norvasc.   History of colorectal cancer in 2009  -s/p resection 08/23/07 showing no residual neoplasm.    COPD  GOLD 0 Stable. On no inhalers. Past history of smoking    There is no height or weight on file to calculate BMI.  Level of care: Telemetry Medical DVT prophylaxis:  eliquis  Code Status:  Full - confirmed with patient Family Communication: None present Disposition Plan:  The patient is from: home  Anticipated d/c is to: home   Patient placed in observation as anticipate less than 2 midnight stay. Requires hospitalization for IV antibiotics for pneumonia.    Patient is currently: stable Consults called: none  Admission status:  observation    Orma Flaming MD [facility]   How to contact the [facility] Attending or Consulting provider [place] or covering provider during after hours [place], for this patient?  Check the care team in [facility] and look for a) attending/consulting [facility] provider listed and b) the [facility] team listed Log into [email or web address] and use [place]'s universal password to access. If you do not have the password, please contact the hospital operator. Locate the [facility] provider you are looking for under [facility] and page to a number that you can be directly reached. If you still have difficulty reaching the provider, please page the [facility] (Director on Call) for the Hospitalists listed on amion for assistance.   06/06/2021, 8:21 PM

## 2021-06-06 NOTE — Assessment & Plan Note (Signed)
Continue protonix/carafate

## 2021-06-06 NOTE — Assessment & Plan Note (Signed)
-  found to be in SVT on arrival, patient asymptomatic.  -given adenosine with rate control  -no change in p wave morphology -working up pneumonia, check troponin/UDS -place on telemetry

## 2021-06-06 NOTE — Assessment & Plan Note (Addendum)
Compliant with eliquis. Thought secondary to malignancy at last hospitalization Negative bilateral dopplers Continue with eliquis/telemetry

## 2021-06-06 NOTE — Assessment & Plan Note (Signed)
Stable. On no inhalers. Past history of smoking

## 2021-06-06 NOTE — Assessment & Plan Note (Addendum)
80 year old with right pleuritic chest pain with findings on CT concerning for possible pneumonia. Recently admitted for sepsis with e.coli bacteremia and concerns for liver abscess -place on observation -differential includes MSK vs. Pneumonia vs. Viral process -afebrile with increased wbc -check blood cultures/lactic acid -change rocephin to cefepime, continue flagyl for liver abscess -check procalcitonin  -covid/flu pending

## 2021-06-06 NOTE — Assessment & Plan Note (Signed)
Extremely well controlled, continue low dose norvasc.

## 2021-06-06 NOTE — ED Notes (Signed)
Lab advised trop is 196 for this pt

## 2021-06-06 NOTE — ED Provider Notes (Addendum)
[facility] Provider Note   CSN: [id number] Arrival date & time: 06/06/21  1313     History Chief Complaint  Patient presents with   Shortness of Breath   Chest Pain    Andre [name] is a 80 y.o. male with significant medical history, coronary artery disease, GERD, hypertension, rectal cancer, TIA.  Patient presents today from home complaining of chest pain and right upper quadrant/flank pain that began at 12 PM.  Patient states that he was sitting on his couch when the pain began and is currently complaining of shortness of breath, right upper quadrant pain and chest pain.  Patient states that the pain is primarily located in his chest without radiation as well as his right upper quadrant.  Patient was recently discharged from this medical facility on 12/9.  Patient admitted due to sepsis secondary to E. coli bacteremia.  Patient recently being seen by Dr. 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 Patient states compliance with all home medication, however his daughter states that he is frequently noncompliant with his medications and she often must come over to help him understand which medications to take.     Shortness of Breath Associated symptoms: chest pain   Chest Pain Associated symptoms: shortness of breath       Past Medical History:  Diagnosis Date   Arthritis    Coronary artery disease, non-occlusive    a. 06/2011: cath for abnormal exercise echo: Only 30-40% proximal RCA. Otherwise normal. b. 10/2015: cath for abnormal NST and atypical CP --> showed mild 30-40% stenosis in the prox-distal RCA   GERD (gastroesophageal reflux disease)    HA (headache)    Hypertension    Memory loss    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[place])    TIA (transient ischemic attack)    Weakness     Patient Active Problem List    Diagnosis Date Noted   Liver lesion 06/06/2021   HFrEF (heart failure with reduced ejection fraction) ([place]) 06/06/2021   Pneumonia 06/06/2021   Sepsis ([place]) 05/13/2021   Acute cholecystitis 05/13/2021   Pulmonary embolism ([place]) 05/13/2021   RUQ pain 04/06/2021   Renal lesion 04/06/2021   Epigastric pain 03/28/2021   Pain due to onychomycosis of toenails of both feet 01/14/2021   Blurry vision, bilateral 12/31/2020   Nail disorder 12/31/2020   Left shoulder pain 02/11/2020   Right shoulder pain 02/11/2020   Vitamin B12 deficiency 12/17/2019   Mild cognitive impairment 12/17/2019   Peripheral neuropathy 12/17/2019   Vitamin D deficiency 12/01/2019   Patellar tendonitis of left knee 12/01/2019   Allergic rhinitis 10/07/2019   Dementia ([place]) 10/07/2019   CAD (coronary artery disease) 09/30/2019   Amnesia 09/10/2019   Right-sided nosebleed 08/07/2019   Leg cramping 08/07/2019   Chest pain 03/23/2019   Epistaxis 03/23/2019   Hyperlipidemia 12/24/2017   Alcohol use 12/24/2017   Non-compliance 12/24/2017   Degenerative disc disease, lumbar 11/15/2017   Left lumbar radiculopathy 08/02/2017   Atherosclerosis 06/03/2017   Low-level of literacy 05/12/2017   Osteoarthritis 04/20/2017   Diabetes ([place]) 03/12/2017   Back pain 02/29/2016   Abnormal stress test 10/18/2015   Erectile dysfunction 02/25/2015   Cigarette smoker 02/10/2015   COPD GOLD 0 11/08/2014   Hemorrhoids 02/11/2014   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 Otherwise normal.   CARDIAC CATHETERIZATION N/A 10/18/2015   Procedure: Left  Heart Cath and Coronary Angiography;  Surgeon: Jettie Booze, MD;  Location: [facility];  Service: Cardiovascular;  Laterality: N/A;   ESOPHAGOGASTRODUODENOSCOPY (EGD) WITH PROPOFOL N/A 05/18/2021   Procedure: ESOPHAGOGASTRODUODENOSCOPY (EGD) WITH PROPOFOL;  Surgeon: Carol Ada, MD;  Location: [facility];  Service: Endoscopy;  Laterality: N/A;   EUS Left 05/18/2021   Procedure: UPPER ENDOSCOPIC ULTRASOUND (EUS) LINEAR;  Surgeon: Carol Ada, MD;  Location: [facility];  Service: Endoscopy;  Laterality: Left;   FINE NEEDLE ASPIRATION  05/18/2021   Procedure: FINE NEEDLE ASPIRATION (FNA) LINEAR;  Surgeon: Carol Ada, MD;  Location: [facility];  Service: Endoscopy;;   NM MYOVIEW LTD  March 2015   LOW RISK. No scar or ischemia. Normal EF (55%). No RWMA   surgical excision of rectal cancer         Family History  Problem Relation Age of Onset   Hypertension Mother    Cancer Mother        unknown type   Cancer Father        unknown type cancer   Liver disease Father     Social History   Tobacco Use   Smoking status: Former    Packs/day: 0.50    Years: 50.00    Pack years: 25.00    Types: Cigarettes    Quit date: 05/01/2021    Years since quitting: 0.0   Smokeless tobacco: Never  Vaping Use   Vaping Use: Never used  Substance Use Topics   Alcohol use: Yes    Alcohol/week: 7.0 standard drinks    Types: 7 Shots of liquor per week    Comment: stopped in 04/2021   Drug use: No    Home Medications Prior to Admission medications   Medication Sig Start Date End Date Taking? Authorizing Provider  amLODipine (NORVASC) 2.5 MG tablet Take 2.5 mg by mouth daily. 03/02/21   [provider]  APIXABAN Arne [place]) VTE STARTER PACK (10MG AND 5MG) Take as directed on package: start with two-84m tablets twice daily for 7 days. On day 8, switch to one-560mtablet twice daily. 05/20/21   Ghimire, ShHenreitta LeberMD  cefTRIAXone (ROCEPHIN) IVPB Inject 2 g into the vein  daily for 20 days. Indication:  E.coli intra-abd infection First Dose: Yes Last Day of Therapy:  06/09/21 Labs - Once weekly:  CBC/D and BMP, Labs - Every other week:  ESR and CRP Method of administration: IV Push Method of administration may be changed at the discretion of home infusion pharmacist based upon assessment of the patient and/or caregiver's ability to self-administer the medication ordered. 05/20/21 06/09/21  Ghimire, ShHenreitta LeberMD  metroNIDAZOLE (FLAGYL) 500 MG tablet Take 1 tablet (500 mg total) by mouth 2 (two) times daily for 20 days. Stop date 06/09/21 05/20/21 06/09/21  GhJonetta OsgoodMD  mirtazapine (REMERON) 7.5 MG tablet Take 1 tablet (7.5 mg total) by mouth at bedtime. 05/26/21   FeTruitt MerleMD  pantoprazole (PROTONIX) 40 MG tablet Take 1 tablet (40 mg total) by mouth daily. 05/20/21 05/20/22  Ghimire, ShHenreitta LeberMD  sucralfate (CARAFATE) 1 g tablet Take 1 tablet (1  g total) by mouth 4 (four) times daily. 03/28/21   Biagio Borg, MD    Allergies    Patient has no known allergies.  Review of Systems   Review of Systems  Respiratory:  Positive for shortness of breath.   Cardiovascular:  Positive for chest pain.   Physical Exam Updated Vital Signs BP 122/83    Pulse 83    Temp 97.8 F (36.6 C) (Oral)    Resp 15    SpO2 97%   Physical Exam Constitutional:      General: He is in acute distress.     Appearance: He is diaphoretic. He is not ill-appearing or toxic-appearing.  HENT:     Head: Normocephalic and atraumatic.  Eyes:     Pupils: Pupils are equal, round, and reactive to light.  Neck:     Vascular: JVD present.  Cardiovascular:     Rate and Rhythm: Regular rhythm. Tachycardia present.  Pulmonary:     Effort: Pulmonary effort is normal. Tachypnea present.  Abdominal:     Palpations: Abdomen is soft. There is no mass.  Musculoskeletal:     Cervical back: Normal range of motion.  Lymphadenopathy:     Cervical: No cervical adenopathy.  Skin:     General: Skin is warm.     Capillary Refill: Capillary refill takes less than 2 seconds.  Neurological:     General: No focal deficit present.     Mental Status: He is alert.    ED Results / Procedures / Treatments   Labs (all labs ordered are listed, but only abnormal results are displayed) Labs Reviewed  CBC WITH DIFFERENTIAL/PLATELET - Abnormal; Notable for the following components:      Result Value   RDW 16.7 (*)    All other components within normal limits  COMPREHENSIVE METABOLIC PANEL - Abnormal; Notable for the following components:   Glucose, Bld 102 (*)    BUN 5 (*)    Albumin 3.0 (*)    Alkaline Phosphatase 140 (*)    Total Bilirubin 1.3 (*)    All other components within normal limits  I-STAT CHEM 8, ED - Abnormal; Notable for the following components:   BUN 5 (*)    TCO2 33 (*)    All other components within normal limits  RESP PANEL BY RT-PCR (FLU A&B, COVID) ARPGX2  LIPASE, BLOOD    EKG EKG Interpretation  Date/Time:  Monday June 06 2021 13:47:41 EST Ventricular Rate:  98 PR Interval:    QRS Duration: 87 QT Interval:  330 QTC Calculation: 422 R Axis:   48 Text Interpretation: ?multiple p waves Normal sinus rhythm Borderline T abnormalities, anterior leads Otherwise no significant change Confirmed by Deno Etienne [id number]) on 06/06/2021 2:22:11 PM  Radiology CT Angio Chest/Abd/Pel for Dissection W and/or Wo Contrast  Result Date: 06/06/2021 CLINICAL DATA:  Chest and back pain, shortness of breath, aortic dissection suspected EXAM: CT ANGIOGRAPHY CHEST, ABDOMEN AND PELVIS TECHNIQUE: Non-contrast CT of the chest was initially obtained. Multidetector CT imaging through the chest, abdomen and pelvis was performed using the standard protocol during bolus administration of intravenous contrast. Multiplanar reconstructed images and MIPs were obtained and reviewed to evaluate the vascular anatomy. CONTRAST:  41m OMNIPAQUE IOHEXOL 350 MG/ML SOLN COMPARISON:  CT  chest angiogram, CT abdomen pelvis, 05/12/2021, MR abdomen, 05/13/2021 FINDINGS: CTA CHEST FINDINGS Cardiovascular: Preferential opacification of the thoracic aorta. Normal contour and caliber of the thoracic aorta without evidence of aneurysm, dissection, or other acute aortic  pathology. Moderate mixed calcific atherosclerosis of the arch and descending thoracic aorta. Cardiomegaly. Three-vessel coronary artery calcifications. No pericardial effusion. Right upper extremity PICC. Mediastinum/Nodes: No enlarged mediastinal, hilar, or axillary lymph nodes. Thyroid gland, trachea, and esophagus demonstrate no significant findings. Lungs/Pleura: Mild centrilobular emphysema. Scattered ground-glass and heterogeneous airspace opacities in the dependent lungs (series 6, image 36). No pleural effusion or pneumothorax. Musculoskeletal: No chest wall abnormality. No acute or significant osseous findings. Review of the MIP images confirms the above findings. CTA ABDOMEN AND PELVIS FINDINGS VASCULAR Normal contour and caliber of the abdominal aorta. No evidence of aneurysm, dissection, or other acute aortic pathology. Moderate to severe mixed calcific atherosclerosis. Duplicated right renal arteries, with a small accessory inferior pole right renal artery, and a solitary left renal artery origin with early branching of renal arterioles. Otherwise standard branching pattern of the abdominal aorta. Atherosclerosis at the branch vessel origins without significant stenosis. Review of the MIP images confirms the above findings. NON-VASCULAR Hepatobiliary: Redemonstrated contracted, heterogeneous gallbladder with an adjacent hypodense lesion of the right lobe of the liver (series 7, image 169). Pancreas: Unremarkable. No pancreatic ductal dilatation or surrounding inflammatory changes. Spleen: Normal in size without significant abnormality. Adrenals/Urinary Tract: Adrenal glands are unremarkable. Interpolar mass of the midportion of  the right kidney again noted, better appreciated by prior MR (series 7, image 185). Bladder is unremarkable. Stomach/Bowel: Stomach is within normal limits. Appendix appears normal. No evidence of bowel wall thickening, distention, or inflammatory changes. Colon is fluid-filled to the rectum. Descending and sigmoid diverticulosis. Lymphatic: No enlarged abdominal or pelvic lymph nodes. Reproductive: No mass or other significant abnormality. Other: No abdominal wall hernia or abnormality. No abdominopelvic ascites. Musculoskeletal: No acute or significant osseous findings. Review of the MIP images confirms the above findings. IMPRESSION: 1. Normal contour and caliber of the thoracic and abdominal aorta without evidence of aneurysm, dissection, or other acute aortic pathology. Moderate to severe mixed calcific atherosclerosis. 2. Scattered ground-glass and heterogeneous airspace opacities in the dependent lungs, nonspecific and most likely infectious or inflammatory. Given concern for new pulmonary nodule raised on prior CT angiogram, recommend follow-up CT in 3 months to ensure resolution. 3. Mild centrilobular emphysema. 4. Redemonstrated contracted, heterogeneous gallbladder with an adjacent hypodense lesion of the right lobe of the liver, poorly assessed on this early arterial phase CT and better characterized by prior MR. 5. Interpolar mass of the midportion of the right kidney again noted, in keeping with renal cell carcinoma again better characterized by prior MR. 6. Colon is fluid-filled to the rectum, suggestive of diarrheal illness. 7. Coronary artery disease. Aortic Atherosclerosis (ICD10-I70.0) and Emphysema (ICD10-J43.9). Electronically Signed   By: Delanna Ahmadi M.D.   On: 06/06/2021 14:39    Procedures Procedures   Medications Ordered in ED Medications  metroNIDAZOLE (FLAGYL) IVPB 500 mg (500 mg Intravenous New Bag/Given 06/06/21 1603)  ceFEPIme (MAXIPIME) 2 g in sodium chloride 0.9 % 100 mL  IVPB (2 g Intravenous New Bag/Given 06/06/21 1606)  sodium chloride 0.9 % bolus 1,000 mL (0 mLs Intravenous Stopped 06/06/21 1556)  fentaNYL (SUBLIMAZE) injection 25 mcg (25 mcg Intravenous Given 06/06/21 1357)  adenosine (ADENOCARD) 6 MG/2ML injection 12 mg (12 mg Intravenous Given 06/06/21 1358)  iohexol (OMNIPAQUE) 350 MG/ML injection 90 mL (90 mLs Intravenous Contrast Given 06/06/21 1414)    ED Course  I have reviewed the triage vital signs and the nursing notes.  Pertinent labs & imaging results that were available during my care of the patient were  reviewed by me and considered in my medical decision making (see chart for details).     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  Presents today with sudden onset terrible abdominal discomfort around 12 PM while he was sitting down watching TV.  Patient found to be in SVT with rate into 200s and also hypotensive.  I notified Dr. Tyrone Nine of this and he immediately came to the patient's bedside.  We administered adenosine which brought his rate down but abdominal discomfort persisted.  Dissection study was then ordered which was negative for aortic dissection but did show possible pneumonia.  Patient states that he is feeling better now but with his prolongation of symptoms after rate control was administered and questionable CAP we feel he is worth observing overnight and administering IV antibiotics.  I have contacted the hospitalist, Dr. Eliberto Ivory, who agreed to see and admit the patient.  The patient was stable at the time of admission.   Final Clinical Impression(s) / ED Diagnoses Final diagnoses:  SVT (supraventricular tachycardia) ([place])  Community acquired pneumonia, unspecified laterality    Rx / DC Orders ED Discharge Orders     None        Lawana Chambers 06/06/21 [place], DO 06/11/21 1551    Azucena Cecil, PA-C 06/19/21 [place], DO 06/20/21 1606

## 2021-06-06 NOTE — Assessment & Plan Note (Addendum)
Euvolemic Monitor I/O Echo: 05/13/21: Ef of 40-45% with mildly decreased LV function and global hypokinesis. Indeterminate diastotlic parameters.

## 2021-06-07 ENCOUNTER — Ambulatory Visit (HOSPITAL_COMMUNITY)
Admission: RE | Admit: 2021-06-07 | Discharge: 2021-06-07 | Disposition: A | Discharge status: Home / Self Care | Payer: Medicare Other | Source: Ambulatory Visit | Attending: Hematology | Admitting: Hematology

## 2021-06-07 DIAGNOSIS — R0602 Shortness of breath: Secondary | ICD-10-CM | POA: Diagnosis present

## 2021-06-07 DIAGNOSIS — Z8673 Personal history of transient ischemic attack (TIA), and cerebral infarction without residual deficits: Secondary | ICD-10-CM | POA: Diagnosis not present

## 2021-06-07 DIAGNOSIS — R413 Other amnesia: Secondary | ICD-10-CM | POA: Diagnosis present

## 2021-06-07 DIAGNOSIS — A4151 Sepsis due to Escherichia coli [E. coli]: Secondary | ICD-10-CM | POA: Diagnosis present

## 2021-06-07 DIAGNOSIS — K219 Gastro-esophageal reflux disease without esophagitis: Secondary | ICD-10-CM | POA: Diagnosis present

## 2021-06-07 DIAGNOSIS — Z20822 Contact with and (suspected) exposure to covid-19: Secondary | ICD-10-CM | POA: Diagnosis present

## 2021-06-07 DIAGNOSIS — I5020 Unspecified systolic (congestive) heart failure: Secondary | ICD-10-CM | POA: Diagnosis present

## 2021-06-07 DIAGNOSIS — Z85048 Personal history of other malignant neoplasm of rectum, rectosigmoid junction, and anus: Secondary | ICD-10-CM | POA: Diagnosis not present

## 2021-06-07 DIAGNOSIS — C641 Malignant neoplasm of right kidney, except renal pelvis: Secondary | ICD-10-CM | POA: Diagnosis present

## 2021-06-07 DIAGNOSIS — E119 Type 2 diabetes mellitus without complications: Secondary | ICD-10-CM

## 2021-06-07 DIAGNOSIS — R0789 Other chest pain: Secondary | ICD-10-CM | POA: Diagnosis not present

## 2021-06-07 DIAGNOSIS — I700 Atherosclerosis of aorta: Secondary | ICD-10-CM | POA: Diagnosis present

## 2021-06-07 DIAGNOSIS — I2699 Other pulmonary embolism without acute cor pulmonale: Secondary | ICD-10-CM

## 2021-06-07 DIAGNOSIS — Z809 Family history of malignant neoplasm, unspecified: Secondary | ICD-10-CM | POA: Diagnosis not present

## 2021-06-07 DIAGNOSIS — K750 Abscess of liver: Secondary | ICD-10-CM | POA: Diagnosis present

## 2021-06-07 DIAGNOSIS — Z8249 Family history of ischemic heart disease and other diseases of the circulatory system: Secondary | ICD-10-CM | POA: Diagnosis not present

## 2021-06-07 DIAGNOSIS — J189 Pneumonia, unspecified organism: Secondary | ICD-10-CM | POA: Diagnosis present

## 2021-06-07 DIAGNOSIS — I110 Hypertensive heart disease with heart failure: Secondary | ICD-10-CM | POA: Diagnosis present

## 2021-06-07 DIAGNOSIS — I10 Essential (primary) hypertension: Secondary | ICD-10-CM

## 2021-06-07 DIAGNOSIS — Z7901 Long term (current) use of anticoagulants: Secondary | ICD-10-CM | POA: Diagnosis not present

## 2021-06-07 DIAGNOSIS — Z87891 Personal history of nicotine dependence: Secondary | ICD-10-CM | POA: Diagnosis not present

## 2021-06-07 DIAGNOSIS — E785 Hyperlipidemia, unspecified: Secondary | ICD-10-CM | POA: Diagnosis present

## 2021-06-07 DIAGNOSIS — I471 Supraventricular tachycardia: Secondary | ICD-10-CM | POA: Diagnosis present

## 2021-06-07 DIAGNOSIS — Z79899 Other long term (current) drug therapy: Secondary | ICD-10-CM | POA: Diagnosis not present

## 2021-06-07 LAB — RAPID URINE DRUG SCREEN, HOSP PERFORMED
Amphetamines: NOT DETECTED
Barbiturates: NOT DETECTED
Benzodiazepines: NOT DETECTED
Cocaine: NOT DETECTED
Opiates: POSITIVE — AB
Tetrahydrocannabinol: NOT DETECTED

## 2021-06-07 LAB — LACTIC ACID, PLASMA: Lactic Acid, Venous: 1.3 mmol/L (ref 0.5–1.9)

## 2021-06-07 LAB — TROPONIN I (HIGH SENSITIVITY): Troponin I (High Sensitivity): 178 ng/L (ref <18)

## 2021-06-07 MED ORDER — SODIUM CHLORIDE 0.9% FLUSH: 10.0000 mL | INTRAVENOUS | Status: DC | PRN

## 2021-06-07 MED ORDER — CHLORHEXIDINE GLUCONATE CLOTH 2 % EX PADS
6.0000 | MEDICATED_PAD | Freq: Every day | CUTANEOUS | Status: DC
  Administered 2021-06-07: 6 via TOPICAL

## 2021-06-07 NOTE — ED Notes (Signed)
Lunch ordered 

## 2021-06-07 NOTE — Progress Notes (Signed)
Progress Note    Andre [name]  HFW:[id number] DOB: [birth year]  DOA: 06/06/2021 PCP: Biagio Borg, MD    Brief Narrative:     Medical records reviewed and are as summarized below:  Andre [name] is an 80 y.o. 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 Pain sharp, rated as a 10/10. Pain constant and still present. Nothing made it better or worse. No radiation. He denies any coughing or fever. He didn't take any medication. The pain was so bad he came to ED. Pain is much better.    Recently admitted 12/1-12/9/22 for sepsis secondary to e.coli bacteremia with concern that he may have underlying malignancy. He was treated with rocephin and flagyl with recommended 4 weeks of IV rocephin and oral flagyl. Liver biopsy contemplated, but deffered and felt more appropriate after completion of 4 weeks of abx. Also found to have PE and discharged on eliquis. Also had right renal lesion concerning for malignancy. He has been taking abx as prescribed.   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 inflammation. Recently admitted for sepsis with e.coli bacteremia and concerns for liver abscess -differential includes MSK vs. Pneumonia vs.  Viral process vs. Inflammation  -blood cultures NGTD -change rocephin to cefepime, continue flagyl for liver abscess: last day 06/09/21.  -covid/flu negative   SVT (supraventricular tachycardia) ([place]) -found to be in SVT on arrival, patient asymptomatic.  -given adenosine with rate control  -no change in p wave morphology -continue tele   Pulmonary embolism ([place]) Compliant with eliquis. 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  Suspicion that this may be gallbladder in origin.  has already seen general surgery where it was discussed at tumor board and felt to be gallbladder in origin. Has seen oncology and is supposed to have a PET scan tomorrow.  -also concerns for liver abscess on last hospitalization with bacteremia. Continue flagyl and rocephin changed to cefepime.   HFrEF (heart failure with reduced ejection fraction) ([place]) Euvolemic Monitor I/O Echo: 05/13/21: Ef of 40-45% with mildly decreased LV function and global hypokinesis. Indeterminate diastotlic parameters.    Type 2 diabetes mellitus without complication (HCC) Well controlled. Lat A1C in October was 6.4 Continue lifestyle and diet modification.  SSI while in hospital    Renal lesion Concern for malignancy-outpatient follow up Has f/u with Dr Abner Greenspan    GERD (gastroesophageal reflux disease) Continue protonix/carafate    Hyperlipidemia Statin held on last discharge, continue to hold with cancer w/u.    Essential hypertension, benign Extremely well controlled, continue low dose norvasc.    History  of colorectal cancer in 2009  -s/p resection 08/23/07 showing no residual neoplasm.    COPD GOLD 0 Stable. On no inhalers.  Past history of smoking    Family Communication/Anticipated D/C date and plan/Code Status   DVT prophylaxis: eliquis Code Status: Full Code.   Disposition Plan: Status is: Observation  The patient remains OBS appropriate and will d/c before 2 midnights.        Medical Consultants:   None.    Subjective:   Feeling better than at admission  Objective:    Vitals:   06/07/21 1015 06/07/21 1030 06/07/21 1045 06/07/21 1100  BP: 106/64 111/75 124/76 115/73  Pulse:  92    Resp: (!) 27 20 18 15   Temp:      TempSrc:      SpO2:  96%      Intake/Output Summary (Last 24 hours) at 06/07/2021 1220 Last data filed at 06/06/2021 1833 Gross per 24 hour  Intake 1200 ml  Output --  Net 1200 ml   There were no vitals filed for this visit.  Exam:  General: Appearance:    Elderly male in no acute distress     Lungs:     Diminished, respirations unlabored  Heart:    Normal heart rate.     MS:   All extremities are intact.    Neurologic:   Awake, alert     Data Reviewed:   I have personally reviewed following labs and imaging studies:  Labs: Labs show the following:   Basic Metabolic Panel: Recent Labs  Lab 06/06/21 1338 06/06/21 1356 06/06/21 1826  NA 137 140  --   K 4.0 4.1  --   CL 101 100  --   CO2 30  --   --   GLUCOSE 102* 99  --   BUN 5* 5*  --   CREATININE 1.03 0.90  --   CALCIUM 9.0  --   --   MG  --   --  2.0   GFR Estimated Creatinine Clearance: 64.4 mL/min (by C-G formula based on SCr of 0.9 mg/dL). Liver Function Tests: Recent Labs  Lab 06/06/21 1338  AST 27  ALT 14  ALKPHOS 140*  BILITOT 1.3*  PROT 6.7  ALBUMIN 3.0*   Recent Labs  Lab 06/06/21 1338  LIPASE 32   No results for input(s): AMMONIA in the last 168 hours. Coagulation profile No results for input(s): INR, PROTIME in the last 168 hours.  CBC: Recent Labs  Lab 06/06/21 1338 06/06/21 1356  WBC 5.9  --   NEUTROABS 2.3  --   HGB 13.4 14.6  HCT 42.1 43.0  MCV  91.3  --   PLT 264  --    Cardiac Enzymes: No results for input(s): CKTOTAL, CKMB, CKMBINDEX, TROPONINI in the last 168 hours. BNP (last 3 results) No results for input(s): PROBNP in the last 8760 hours. CBG: No results for input(s): GLUCAP in the last 168 hours. D-Dimer: No results for input(s): DDIMER in the last 72 hours. Hgb A1c: No results for input(s): HGBA1C in the last 72 hours. Lipid Profile: No results for input(s): CHOL, HDL, LDLCALC, TRIG, CHOLHDL, LDLDIRECT in the last 72 hours. Thyroid function studies: Recent Labs    06/06/21 1830  TSH 0.648   Anemia work up: No results for input(s): VITAMINB12, FOLATE, FERRITIN, TIBC, IRON, RETICCTPCT in the last 72 hours. Sepsis Labs: Recent Labs  Lab 06/06/21 1338 06/06/21 1816 06/06/21 1826 06/07/21 0106  PROCALCITON  --   --  <  0.10  --   WBC 5.9  --   --   --   LATICACIDVEN  --  1.3  --  1.3    Microbiology Recent Results (from the past 240 hour(s))  Resp Panel by RT-PCR (Flu A&B, Covid) Nasopharyngeal Swab     Status: None   Collection Time: 06/06/21  4:30 PM   Specimen: Nasopharyngeal Swab; Nasopharyngeal(NP) swabs in vial transport medium  Result Value Ref Range Status   SARS Coronavirus 2 by RT PCR NEGATIVE NEGATIVE Final    Comment: (NOTE) SARS-CoV-2 target nucleic acids are NOT DETECTED.  The SARS-CoV-2 RNA is generally detectable in upper respiratory specimens during the acute phase of infection. The lowest concentration of SARS-CoV-2 viral copies this assay can detect is 138 copies/mL. A negative result does not preclude SARS-Cov-2 infection and should not be used as the sole basis for treatment or other patient management decisions. A negative result may occur with  improper specimen collection/handling, submission of specimen other than nasopharyngeal swab, presence of viral mutation(s) within the areas targeted by this assay, and inadequate number of viral copies(<138 copies/mL). A negative result  must be combined with clinical observations, patient history, and epidemiological information. The expected result is Negative.  Fact Sheet for Patients:  EntrepreneurPulse.com.au  Fact Sheet for Healthcare Providers:  IncredibleEmployment.be  This test is no t yet approved or cleared by the [place] FDA and  has been authorized for detection and/or diagnosis of SARS-CoV-2 by FDA under an Emergency Use Authorization (EUA). This EUA will remain  in effect (meaning this test can be used) for the duration of the COVID-19 declaration under Section 564(b)(1) of the Act, 21 U.S.C.section 360bbb-3(b)(1), unless the authorization is terminated  or revoked sooner.       Influenza A by PCR NEGATIVE NEGATIVE Final   Influenza B by PCR NEGATIVE NEGATIVE Final    Comment: (NOTE) The Xpert Xpress SARS-CoV-2/FLU/RSV plus assay is intended as an aid in the diagnosis of influenza from Nasopharyngeal swab specimens and should not be used as a sole basis for treatment. Nasal washings and aspirates are unacceptable for Xpert Xpress SARS-CoV-2/FLU/RSV testing.  Fact Sheet for Patients: EntrepreneurPulse.com.au  Fact Sheet for Healthcare Providers: IncredibleEmployment.be  This test is not yet approved or cleared by the [place] FDA and has been authorized for detection and/or diagnosis of SARS-CoV-2 by FDA under an Emergency Use Authorization (EUA). This EUA will remain in effect (meaning this test can be used) for the duration of the COVID-19 declaration under Section 564(b)(1) of the Act, 21 U.S.C. section 360bbb-3(b)(1), unless the authorization is terminated or revoked.  Performed at [facility], [place] [address], [place], [place] [address]   Culture, blood (routine x 2)     Status: None (Preliminary result)   Collection Time: 06/06/21  6:26 PM   Specimen: BLOOD  Result Value Ref Range Status    Specimen Description BLOOD LEFT ANTECUBITAL  Final   Special Requests   Final    BOTTLES DRAWN AEROBIC AND ANAEROBIC Blood Culture adequate volume   Culture   Final    NO GROWTH < 12 HOURS Performed at [facility], [place] [address], [place], [place] [address]    Report Status PENDING  Incomplete  Culture, blood (routine x 2)     Status: None (Preliminary result)   Collection Time: 06/06/21  6:26 PM   Specimen: BLOOD  Result Value Ref Range Status   Specimen Description BLOOD RIGHT ANTECUBITAL  Final  Special Requests   Final    BOTTLES DRAWN AEROBIC AND ANAEROBIC Blood Culture adequate volume   Culture   Final    NO GROWTH < 12 HOURS Performed at [facility], [place] [address] [place], [place] [address]    Report Status PENDING  Incomplete    Procedures and diagnostic studies:  CT Angio Chest/Abd/Pel for Dissection W and/or Wo Contrast  Result Date: 06/06/2021 CLINICAL DATA:  Chest and back pain, shortness of breath, aortic dissection suspected EXAM: CT ANGIOGRAPHY CHEST, ABDOMEN AND PELVIS TECHNIQUE: Non-contrast CT of the chest was initially obtained. Multidetector CT imaging through the chest, abdomen and pelvis was performed using the standard protocol during bolus administration of intravenous contrast. Multiplanar reconstructed images and MIPs were obtained and reviewed to evaluate the vascular anatomy. CONTRAST:  60mL OMNIPAQUE IOHEXOL 350 MG/ML SOLN COMPARISON:  CT chest angiogram, CT abdomen pelvis, 05/12/2021, MR abdomen, 05/13/2021 FINDINGS: CTA CHEST FINDINGS Cardiovascular: Preferential opacification of the thoracic aorta. Normal contour and caliber of the thoracic aorta without evidence of aneurysm, dissection, or other acute aortic pathology. Moderate mixed calcific atherosclerosis of the arch and descending thoracic aorta. Cardiomegaly. Three-vessel coronary artery calcifications. No pericardial effusion. Right upper extremity PICC. Mediastinum/Nodes: No enlarged  mediastinal, hilar, or axillary lymph nodes. Thyroid gland, trachea, and esophagus demonstrate no significant findings. Lungs/Pleura: Mild centrilobular emphysema. Scattered ground-glass and heterogeneous airspace opacities in the dependent lungs (series 6, image 36). No pleural effusion or pneumothorax. Musculoskeletal: No chest wall abnormality. No acute or significant osseous findings. Review of the MIP images confirms the above findings. CTA ABDOMEN AND PELVIS FINDINGS VASCULAR Normal contour and caliber of the abdominal aorta. No evidence of aneurysm, dissection, or other acute aortic pathology. Moderate to severe mixed calcific atherosclerosis. Duplicated right renal arteries, with a small accessory inferior pole right renal artery, and a solitary left renal artery origin with early branching of renal arterioles. Otherwise standard branching pattern of the abdominal aorta. Atherosclerosis at the branch vessel origins without significant stenosis. Review of the MIP images confirms the above findings. NON-VASCULAR Hepatobiliary: Redemonstrated contracted, heterogeneous gallbladder with an adjacent hypodense lesion of the right lobe of the liver (series 7, image 169). Pancreas: Unremarkable. No pancreatic ductal dilatation or surrounding inflammatory changes. Spleen: Normal in size without significant abnormality. Adrenals/Urinary Tract: Adrenal glands are unremarkable. Interpolar mass of the midportion of the right kidney again noted, better appreciated by prior MR (series 7, image 185). Bladder is unremarkable. Stomach/Bowel: Stomach is within normal limits. Appendix appears normal. No evidence of bowel wall thickening, distention, or inflammatory changes. Colon is fluid-filled to the rectum. Descending and sigmoid diverticulosis. Lymphatic: No enlarged abdominal or pelvic lymph nodes. Reproductive: No mass or other significant abnormality. Other: No abdominal wall hernia or abnormality. No abdominopelvic  ascites. Musculoskeletal: No acute or significant osseous findings. Review of the MIP images confirms the above findings. IMPRESSION: 1. Normal contour and caliber of the thoracic and abdominal aorta without evidence of aneurysm, dissection, or other acute aortic pathology. Moderate to severe mixed calcific atherosclerosis. 2. Scattered ground-glass and heterogeneous airspace opacities in the dependent lungs, nonspecific and most likely infectious or inflammatory. Given concern for new pulmonary nodule raised on prior CT angiogram, recommend follow-up CT in 3 months to ensure resolution. 3. Mild centrilobular emphysema. 4. Redemonstrated contracted, heterogeneous gallbladder with an adjacent hypodense lesion of the right lobe of the liver, poorly assessed on this early arterial phase CT and better characterized by prior MR. 5. Interpolar mass of the midportion of  the right kidney again noted, in keeping with renal cell carcinoma again better characterized by prior MR. 6. Colon is fluid-filled to the rectum, suggestive of diarrheal illness. 7. Coronary artery disease. Aortic Atherosclerosis (ICD10-I70.0) and Emphysema (ICD10-J43.9). Electronically Signed   By: Delanna Ahmadi M.D.   On: 06/06/2021 14:39    Medications:    amLODipine  2.5 mg Oral Daily   apixaban  5 mg Oral BID   aspirin EC  81 mg Oral Daily   metroNIDAZOLE  500 mg Oral BID   mirtazapine  7.5 mg Oral Daily   pantoprazole  40 mg Oral Daily   sodium chloride flush  3 mL Intravenous Q12H   sucralfate  1 g Oral QID   Continuous Infusions:  sodium chloride     ceFEPime (MAXIPIME) IV Stopped (06/07/21 8144)     LOS: 0 days   Geradine Girt  [facility]   How to contact the [facility] Attending or Consulting provider [place] or covering provider during after hours [place], for this patient?  Check the care team in [facility] and look for a) attending/consulting TR[facility] provider listed and b) the [facility] team listed Log into [email or web address] and use [facility]'s universal password to access. If you do not have the password, please contact the hospital operator. Locate the [facility] provider you are looking for under [facility] and page to a number that you can be directly reached. If you still have difficulty reaching the provider, please page the [facility] (Director on Call) for the Hospitalists listed on amion for assistance.  06/07/2021, 12:20 PM

## 2021-06-07 NOTE — Progress Notes (Signed)
NEW ADMISSION NOTE New Admission Note:   Arrival Method: stretcher Mental Orientation: A&O X4 Telemetry: [id number] Assessment: Completed Skin: Intact dry flaky IV: PICC RIGHT upper arm, LAC Pain: 7/10 Tubes: NONE Safety Measures: Safety Fall Prevention Plan has been given, discussed and signed Admission: Completed [facility] [place] Orientation: Patient has been orientated to the room, unit and staff.  Family:none at bedside  Orders have been reviewed and implemented. Will continue to monitor the patient. Call light has been placed within reach and bed alarm has been activated.   Tal Kempker S Maylin [place], RN

## 2021-06-07 NOTE — ED Notes (Signed)
Pt assisted with set up to eat breakfast

## 2021-06-07 NOTE — ED Notes (Signed)
Attending doc rounded

## 2021-06-07 NOTE — Evaluation (Signed)
Physical Therapy Evaluation Patient Details Name: Andre [name] MRN: [id number] DOB: 06-24-1940 Today's Date: 06/07/2021  History of Present Illness  Pt is a 80 y/o male admitted 12/26 secondary to SOB and chest pain. Imaging showed PNA vs inflammation. PMH includes HTN, COPD, DM, CAD, and rectal cancer.  Clinical Impression  Pt admitted secondary to problem above with deficits below. Pt requiring Min A for transfers and short distance gait within the room. Educated about using AD at home to increase safety. Recommending HHPT at d/c to address current deficits. Will continue to follow acutely.        Recommendations for follow up therapy are one component of a multi-disciplinary discharge planning process, led by the attending physician.  Recommendations may be updated based on patient status, additional functional criteria and insurance authorization.  Follow Up Recommendations Home health PT    Assistance Recommended at Discharge Intermittent Supervision/Assistance  Functional Status Assessment Patient has had a recent decline in their functional status and demonstrates the ability to make significant improvements in function in a reasonable and predictable amount of time.  Equipment Recommendations  None recommended by PT    Recommendations for Other Services       Precautions / Restrictions Precautions Precautions: Fall Restrictions Weight Bearing Restrictions: No      Mobility  Bed Mobility Overal bed mobility: Needs Assistance Bed Mobility: Supine to Sit;Sit to Supine     Supine to sit: Min assist Sit to supine: Supervision   General bed mobility comments: Min A for trunk elevation    Transfers Overall transfer level: Needs assistance Equipment used: 1 person hand held assist Transfers: Sit to/from Stand Sit to Stand: Min assist           General transfer comment: Min A for steadying assist. Pt with slight posterior lean and relied on bed for support  initially    Ambulation/Gait Ambulation/Gait assistance: Min guard;Min assist Gait Distance (Feet): 5 Feet Assistive device: 1 person hand held assist Gait Pattern/deviations: Step-through pattern Gait velocity: Decreased     General Gait Details: Min guard to min A For steadying to ambulate short distance in room. Educated about using rollator at home to increase safety.  Stairs            Wheelchair Mobility    Modified Rankin (Stroke Patients Only)       Balance Overall balance assessment: Needs assistance Sitting-balance support: No upper extremity supported Sitting balance-Leahy Scale: Fair     Standing balance support: Single extremity supported Standing balance-Leahy Scale: Poor Standing balance comment: Reliant on UE and external support                             Pertinent Vitals/Pain Pain Assessment: 0-10 Pain Score: 5  Pain Location: chest Pain Descriptors / Indicators: Aching Pain Intervention(s): Monitored during session;Limited activity within patient's tolerance;Repositioned    Home Living Family/patient expects to be discharged to:: Private residence Living Arrangements: Non-relatives/Friends Available Help at Discharge: Friend(s);Available 24 hours/day Type of Home: House Home Access: Stairs to enter Entrance Stairs-Rails: Psychiatric nurse of Steps: 4   Home Layout: One level Home Equipment: Rollator (4 wheels)      Prior Function Prior Level of Function : Independent/Modified Independent             Mobility Comments: Used rollator for a couple days after last d/c, but then did not use       Hand Dominance  Extremity/Trunk Assessment   Upper Extremity Assessment Upper Extremity Assessment: Generalized weakness    Lower Extremity Assessment Lower Extremity Assessment: Generalized weakness    Cervical / Trunk Assessment Cervical / Trunk Assessment: Kyphotic  Communication    Communication: No difficulties  Cognition Arousal/Alertness: Awake/alert Behavior During Therapy: WFL for tasks assessed/performed Overall Cognitive Status: Within Functional Limits for tasks assessed                                          General Comments      Exercises     Assessment/Plan    PT Assessment Patient needs continued PT services  PT Problem List Decreased strength;Decreased activity tolerance;Decreased balance;Decreased mobility;Cardiopulmonary status limiting activity;Pain       PT Treatment Interventions Gait training;Stair training;Functional mobility training;Therapeutic activities;Patient/family education;Balance training;Therapeutic exercise    PT Goals (Current goals can be found in the Care Plan section)  Acute Rehab PT Goals Patient Stated Goal: return home PT Goal Formulation: With patient Time For Goal Achievement: 06/21/21 Potential to Achieve Goals: Fair    Frequency Min 3X/week   Barriers to discharge        Co-evaluation               AM-PAC PT "6 Clicks" Mobility  Outcome Measure Help needed turning from your back to your side while in a flat bed without using bedrails?: None Help needed moving from lying on your back to sitting on the side of a flat bed without using bedrails?: A Little Help needed moving to and from a bed to a chair (including a wheelchair)?: A Little Help needed standing up from a chair using your arms (e.g., wheelchair or bedside chair)?: A Little Help needed to walk in hospital room?: A Little Help needed climbing 3-5 steps with a railing? : A Lot 6 Click Score: 18    End of Session Equipment Utilized During Treatment: Gait belt Activity Tolerance: Patient tolerated treatment well Patient left: in bed;with call bell/phone within reach (on stretcher in ED) Nurse Communication: Mobility status PT Visit Diagnosis: Unsteadiness on feet (R26.81);Muscle weakness (generalized) (M62.81);Difficulty  in walking, not elsewhere classified (R26.2)    Time: 5093-2671 PT Time Calculation (min) (ACUTE ONLY): 13 min   Charges:   PT Evaluation $PT Eval Low Complexity: 1 Low          Lou Miner, DPT  Acute Rehabilitation Services  Pager: [phone] Office: [phone]   WISDOM RICKEY 06/07/2021, 12:00 PM

## 2021-06-07 NOTE — ED Notes (Signed)
Pt's daughter given update via phone

## 2021-06-07 NOTE — ED Notes (Signed)
Markeith Jue daughter [phone] requesting to speak to the patient

## 2021-06-08 ENCOUNTER — Other Ambulatory Visit: Payer: Self-pay

## 2021-06-08 ENCOUNTER — Encounter: Payer: Self-pay | Admitting: Internal Medicine

## 2021-06-08 ENCOUNTER — Ambulatory Visit (INDEPENDENT_AMBULATORY_CARE_PROVIDER_SITE_OTHER): Payer: Medicare Other | Admitting: Internal Medicine

## 2021-06-08 ENCOUNTER — Other Ambulatory Visit (HOSPITAL_COMMUNITY): Payer: Self-pay

## 2021-06-08 ENCOUNTER — Telehealth (HOSPITAL_COMMUNITY): Payer: Self-pay | Admitting: Pharmacist

## 2021-06-08 VITALS — BP 128/68 | HR 90 | Temp 97.5°F | Wt 146.0 lb

## 2021-06-08 DIAGNOSIS — K750 Abscess of liver: Secondary | ICD-10-CM | POA: Diagnosis not present

## 2021-06-08 DIAGNOSIS — R0789 Other chest pain: Secondary | ICD-10-CM

## 2021-06-08 DIAGNOSIS — I2699 Other pulmonary embolism without acute cor pulmonale: Secondary | ICD-10-CM | POA: Diagnosis not present

## 2021-06-08 DIAGNOSIS — E119 Type 2 diabetes mellitus without complications: Secondary | ICD-10-CM | POA: Diagnosis not present

## 2021-06-08 LAB — BASIC METABOLIC PANEL
Anion gap: 7 (ref 5–15)
BUN: 5 mg/dL — ABNORMAL LOW (ref 8–23)
CO2: 27 mmol/L (ref 22–32)
Calcium: 9 mg/dL (ref 8.9–10.3)
Chloride: 101 mmol/L (ref 98–111)
Creatinine, Ser: 0.71 mg/dL (ref 0.61–1.24)
GFR, Estimated: >60 mL/min (ref >60)
Glucose, Bld: 83 mg/dL (ref 70–99)
Potassium: 4.1 mmol/L (ref 3.5–5.1)
Sodium: 135 mmol/L (ref 135–145)

## 2021-06-08 LAB — CBC
HCT: 37.4 % — ABNORMAL LOW (ref 39.0–52.0)
Hemoglobin: 12.6 g/dL — ABNORMAL LOW (ref 13.0–17.0)
MCH: 30.2 pg (ref 26.0–34.0)
MCHC: 33.7 g/dL (ref 30.0–36.0)
MCV: 89.7 fL (ref 80.0–100.0)
Platelets: 194 10*3/uL (ref 150–400)
RBC: 4.17 MIL/uL — ABNORMAL LOW (ref 4.22–5.81)
RDW: 16.4 % — ABNORMAL HIGH (ref 11.5–15.5)
WBC: 5.4 10*3/uL (ref 4.0–10.5)
nRBC: 0 % (ref 0.0–0.2)

## 2021-06-08 LAB — PROCALCITONIN: Procalcitonin: <0.1 ng/mL

## 2021-06-08 MED ORDER — HEPARIN SOD (PORK) LOCK FLUSH 100 UNIT/ML IV SOLN
250.0000 [IU] | INTRAVENOUS | Status: AC | PRN
  Administered 2021-06-08: 250 [IU]
  Filled 2021-06-08: qty 2.5

## 2021-06-08 MED ORDER — SODIUM CHLORIDE 0.9 % IV SOLN
2.0000 g | Freq: Once | INTRAVENOUS | Status: AC
  Administered 2021-06-08: 2 g via INTRAVENOUS

## 2021-06-08 MED ORDER — SODIUM CHLORIDE 0.9 % IV SOLN
2.0000 g | Freq: Once | INTRAVENOUS | Status: DC
  Filled 2021-06-08: qty 20

## 2021-06-08 MED ORDER — CARVEDILOL 3.125 MG PO TABS: 3.1250 mg | ORAL_TABLET | Freq: Two times a day (BID) | ORAL | 0 refills | Status: AC

## 2021-06-08 MED ORDER — ACETAMINOPHEN 325 MG PO TABS: 650.0000 mg | ORAL_TABLET | Freq: Four times a day (QID) | ORAL | Status: AC | PRN

## 2021-06-08 MED ORDER — DICLOFENAC SODIUM 1 % EX GEL: 2.0000 g | Freq: Four times a day (QID) | CUTANEOUS | Status: AC

## 2021-06-08 MED ORDER — CARVEDILOL 3.125 MG PO TABS
3.1250 mg | ORAL_TABLET | Freq: Two times a day (BID) | ORAL | Status: DC
  Administered 2021-06-08: 3.125 mg via ORAL
  Filled 2021-06-08: qty 1

## 2021-06-08 NOTE — Progress Notes (Signed)
Physical Therapy Treatment Patient Details Name: Andre [name] MRN: [id number] DOB: [birth year] Today's Date: 06/08/2021   History of Present Illness Pt is a 80 y/o male admitted 12/26 secondary to SOB and chest pain. Imaging showed PNA vs inflammation. PMH includes HTN, COPD, DM, CAD, and rectal cancer.    PT Comments    Pt made good progress towards his physical therapy goals during his inpatient stay. Ambulating 200 feet with a walker without physical difficulty. Denies shortness of breath. Continue to recommend Rollator use at home for additional external support.     Recommendations for follow up therapy are one component of a multi-disciplinary discharge planning process, led by the attending physician.  Recommendations may be updated based on patient status, additional functional criteria and insurance authorization.  Follow Up Recommendations  Home health PT     Assistance Recommended at Discharge Intermittent Supervision/Assistance  Equipment Recommendations  None recommended by PT    Recommendations for Other Services       Precautions / Restrictions Precautions Precautions: Fall Restrictions Weight Bearing Restrictions: No     Mobility  Bed Mobility Overal bed mobility: Modified Independent                  Transfers Overall transfer level: Modified independent Equipment used: Rolling walker (2 wheels)                    Ambulation/Gait Ambulation/Gait assistance: Modified independent (Device/Increase time) Gait Distance (Feet): 200 Feet Assistive device: Rolling walker (2 wheels) Gait Pattern/deviations: Step-through pattern Gait velocity: Decreased     General Gait Details: Slow and steady pace with use of RW. moderate reliance through arms   Stairs             Wheelchair Mobility    Modified Rankin (Stroke Patients Only)       Balance Overall balance assessment: Needs assistance Sitting-balance support: No upper  extremity supported Sitting balance-Leahy Scale: Good     Standing balance support: No upper extremity supported;During functional activity Standing balance-Leahy Scale: Fair                              Cognition Arousal/Alertness: Awake/alert Behavior During Therapy: WFL for tasks assessed/performed Overall Cognitive Status: Within Functional Limits for tasks assessed                                          Exercises      General Comments        Pertinent Vitals/Pain Pain Assessment: Faces Faces Pain Scale: Hurts little more Pain Location: abdomen Pain Descriptors / Indicators: Aching Pain Intervention(s): Monitored during session    Home Living                          Prior Function            PT Goals (current goals can now be found in the care plan section) Acute Rehab PT Goals Patient Stated Goal: return home PT Goal Formulation: With patient Time For Goal Achievement: 06/21/21 Potential to Achieve Goals: Fair Progress towards PT goals: Progressing toward goals    Frequency    Min 3X/week      PT Plan Current plan remains appropriate    Co-evaluation  AM-PAC PT "6 Clicks" Mobility   Outcome Measure  Help needed turning from your back to your side while in a flat bed without using bedrails?: None Help needed moving from lying on your back to sitting on the side of a flat bed without using bedrails?: None Help needed moving to and from a bed to a chair (including a wheelchair)?: None Help needed standing up from a chair using your arms (e.g., wheelchair or bedside chair)?: None Help needed to walk in hospital room?: None Help needed climbing 3-5 steps with a railing? : A Little 6 Click Score: 23    End of Session Equipment Utilized During Treatment: Gait belt Activity Tolerance: Patient tolerated treatment well Patient left: with call bell/phone within reach;in chair;with chair alarm  set Nurse Communication: Mobility status PT Visit Diagnosis: Unsteadiness on feet (R26.81);Muscle weakness (generalized) (M62.81);Difficulty in walking, not elsewhere classified (R26.2)     Time: 1610-9604 PT Time Calculation (min) (ACUTE ONLY): 18 min  Charges:  $Therapeutic Activity: 8-22 mins                     Wyona Almas, PT, DPT Acute Rehabilitation Services Pager [phone] Office [phone]    Deno Etienne 06/08/2021, 3:29 PM

## 2021-06-08 NOTE — Discharge Instructions (Signed)

## 2021-06-08 NOTE — Telephone Encounter (Signed)
Pharmacy Transitions of Care Follow-up Telephone Call   Contacted pt regarding recent discharge and to provide counseling on new medications with a focus on Eliquis.  Pt reports he is currently in the hospital (although this was not evident from reviewing his record).  No further follow-up necessary at this time since patient is receiving medical care.

## 2021-06-08 NOTE — Progress Notes (Signed)
Ok to stop abx today as plan per previous ID plan for e.coli bacteremia/liver abscess per Dr. Eliseo Squires.  Onnie Boer, PharmD, BCIDP, AAHIVP, CPP Infectious Disease Pharmacist 06/08/2021 9:32 AM

## 2021-06-08 NOTE — Progress Notes (Signed)
DISCHARGE NOTE HOME Andre [name] to be discharged Home per MD order. Discussed prescriptions and follow up appointments with the patient. Prescriptions given to patient; medication list explained in detail. Patient verbalized understanding.  Skin clean, dry and intact without evidence of skin break down, no evidence of skin tears noted. IV catheter discontinued intact. Site without signs and symptoms of complications. Dressing and pressure applied. Pt denies pain at the site currently. No complaints noted.  Patient free of lines, drains, and wounds.   An After Visit Summary (AVS) was printed and given to the patient. Patient escorted via wheelchair, and discharged home via private auto.  Ra Pfiester S Deshaun Weisinger, RN

## 2021-06-08 NOTE — Progress Notes (Signed)
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 06/06/2021   HFrEF (heart failure with reduced ejection fraction) ([place]) 06/06/2021   Pneumonia 06/06/2021   SVT (supraventricular tachycardia) ([place]) 06/06/2021   History of colorectal cancer in 2009  06/06/2021   Sepsis ([place]) 05/13/2021   Acute cholecystitis 05/13/2021   Pulmonary embolism ([place]) 05/13/2021   RUQ pain 04/06/2021   Renal lesion 04/06/2021   Epigastric pain 03/28/2021   Pain due to onychomycosis of toenails of both feet 01/14/2021   Blurry vision, bilateral 12/31/2020   Nail disorder 12/31/2020   Left shoulder pain 02/11/2020   Right shoulder pain 02/11/2020   Vitamin B12 deficiency 12/17/2019   Mild cognitive impairment 12/17/2019   Peripheral neuropathy 12/17/2019   Vitamin D deficiency 12/01/2019   Patellar tendonitis of left knee 12/01/2019   Allergic rhinitis 10/07/2019   Dementia ([place]) 10/07/2019   Amnesia 09/10/2019   Right-sided nosebleed 08/07/2019   Leg cramping 08/07/2019   Chest pain 03/23/2019   Epistaxis 03/23/2019   Hyperlipidemia 12/24/2017   Alcohol use 12/24/2017   Non-compliance 12/24/2017   Degenerative disc disease, lumbar 11/15/2017   Left lumbar radiculopathy 08/02/2017   Atherosclerosis 06/03/2017   Low-level of literacy 05/12/2017   Osteoarthritis 04/20/2017   Type 2 diabetes mellitus without complication ([place]) 24/40/1027   Back pain 02/29/2016   Abnormal stress test 10/18/2015   Erectile dysfunction 02/25/2015   Cigarette smoker 02/10/2015   COPD GOLD 0 11/08/2014   Hemorrhoids 02/11/2014   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   APIXABAN (ELIQUIS) VTE STARTER PACK (10MG  AND 5MG )    Take as directed on package: start with two-5mg  tablets twice daily for 7 days. On day 8, switch to one-5mg  tablet twice daily.   ASPIRIN EC 81 MG TABLET    Take 81 mg by mouth daily. Swallow whole.   CARVEDILOL (COREG) 3.125 MG TABLET    Take 1 tablet (3.125 mg total) by mouth 2 (two) times daily with a meal.   DICLOFENAC SODIUM (VOLTAREN) 1 % GEL    Apply 2 g topically 4 (four) times daily.   METRONIDAZOLE (FLAGYL) 500 MG TABLET    Take 1 tablet (500 mg total) by mouth 2 (two) times daily for 20 days. Stop date 06/09/21   MIRTAZAPINE (REMERON) 7.5 MG TABLET    Take 1 tablet (7.5 mg total) by mouth at bedtime.   PANTOPRAZOLE (PROTONIX) 40 MG TABLET    Take 1 tablet (40 mg total) by mouth daily.   SUCRALFATE (CARAFATE) 1 G TABLET    Take 1 tablet (1 g total) by mouth 4 (four) times daily.  Modified Medications   No medications on file  Discontinued Medications   No medications on file    Subjective: 3 YM with history of rectal cancer status post excision about 10 years ago, coronary artery disease,  TIA presents for hospital follow-up of E. coli bacteremia secondary to possible liver abscess.  Patient had presented with diarrhea and rigors with CT showing necrotic abdominal lymph nodes, renal mass suspicious for cancer and cholecystitis.  MRCP showed chronic cholecystitis and cholelithiasis, necrotic adenopathy,  pericholecystic right liver lobe mass measuring 3.5X 3.2 cm new since 04/03/2021, mild intra and extrahepatic biliary duct dilatation.  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  Gallbladder was unable  to be biopside.  Given the new  liver lesion in the setting of Ecoli bacteremia plan was to treat with 4 weeks of IV antibiotics and heme-onc follow-up.  Patient seen by Dr. Krista Blue  Feng(Heme-onc) outpatient, PET ordered to evaluate extent of disease. In the interim, patient was hospitalized 12/26-12/28 as he presented with right-sided chest pain.  CT showed groundglass opacities concerning for infection versus inflammation.  Antibiotics changed from ceftriaxone to cefepime(to treat pneumonia) and metronidazole, (EOT 06/09/2021). Today, patient presents with daughter.  He reports he still feels weak.  He denies fever, chills, nausea, vomiting.  He is on room air, denies cough.  Review of Systems: Review of Systems  All other systems reviewed and are negative.  Past Medical History:  Diagnosis Date   Arthritis    Coronary artery disease, non-occlusive    a. 06/2011: cath for abnormal exercise echo: Only 30-40% proximal RCA. Otherwise normal. b. 10/2015: cath for abnormal NST and atypical CP --> showed mild 30-40% stenosis in the prox-distal RCA   GERD (gastroesophageal reflux disease)    HA (headache)    Hypertension    Memory loss    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H[facility])    TIA (transient ischemic attack)    Weakness     Social History   Tobacco Use   Smoking status: Former    Packs/day: 0.50    Years: 50.00    Pack years: 25.00    Types: Cigarettes    Quit date: 05/01/2021    Years since quitting: 0.1   Smokeless tobacco: Never  Vaping Use   Vaping Use: Never used  Substance Use Topics   Alcohol use: Yes    Alcohol/week: 7.0 standard drinks    Types: 7 Shots of liquor per week    Comment: stopped in 04/2021   Drug use: No    Family History  Problem Relation Age of Onset   Hypertension Mother    Cancer Mother        unknown type   Cancer Father        unknown type cancer   Liver disease Father     No Known Allergies  Health Maintenance  Topic Date Due   Pneumonia Vaccine 67+ Years  old (1 - PCV) Never done   OPHTHALMOLOGY EXAM  Never done   Zoster Vaccines- Shingrix (1 of 2) Never done   COVID-19 Vaccine (3 - Pfizer risk series) 10/14/2019   INFLUENZA VACCINE  01/10/2021   TETANUS/TDAP  12/31/2021 (Originally 12/27/1959)   HEMOGLOBIN A1C  09/26/2021   FOOT EXAM  12/31/2021   URINE MICROALBUMIN  12/31/2021   Hepatitis C Screening  Completed   HPV VACCINES  Aged Out    Objective:  Vitals:   06/08/21 1540  BP: 128/68  Pulse: 90  Temp: (!) 97.5 F (36.4 C)  TempSrc: Oral  SpO2: 98%  Weight: 146 lb (66.2 kg)   Body mass index is 20.36 kg/m.  Physical Exam Constitutional:      General:  He is not in acute distress.    Appearance: He is normal weight. He is not toxic-appearing.  HENT:     Head: Normocephalic and atraumatic.     Right Ear: External ear normal.     Left Ear: External ear normal.     Nose: No congestion or rhinorrhea.     Mouth/Throat:     Mouth: Mucous membranes are moist.     Pharynx: Oropharynx is clear.  Eyes:     Extraocular Movements: Extraocular movements intact.     Conjunctiva/sclera: Conjunctivae normal.     Pupils: Pupils are equal, round, and reactive to light.  Cardiovascular:     Rate and Rhythm: Normal rate and regular rhythm.     Heart sounds: No murmur heard.   No friction rub. No gallop.  Pulmonary:     Effort: Pulmonary effort is normal.     Breath sounds: Normal breath sounds.  Abdominal:     General: Abdomen is flat. Bowel sounds are normal.     Palpations: Abdomen is soft.  Musculoskeletal:        General: No swelling. Normal range of motion.     Cervical back: Normal range of motion and neck supple.  Skin:    General: Skin is warm and dry.  Neurological:     General: No focal deficit present.     Mental Status: He is oriented to person, place, and time.  Psychiatric:        Mood and Affect: Mood normal.    Lab Results Lab Results  Component Value Date   WBC 5.4 06/08/2021   HGB 12.6 (L) 06/08/2021    HCT 37.4 (L) 06/08/2021   MCV 89.7 06/08/2021   PLT 194 06/08/2021    Lab Results  Component Value Date   CREATININE 0.71 06/08/2021   BUN 5 (L) 06/08/2021   NA 135 06/08/2021   K 4.1 06/08/2021   CL 101 06/08/2021   CO2 27 06/08/2021    Lab Results  Component Value Date   ALT 14 06/06/2021   AST 27 06/06/2021   ALKPHOS 140 (H) 06/06/2021   BILITOT 1.3 (H) 06/06/2021    Lab Results  Component Value Date   CHOL 143 12/31/2020   HDL 37.90 (L) 12/31/2020   LDLCALC 84 12/31/2020   TRIG 107.0 12/31/2020   CHOLHDL 4 12/31/2020   Lab Results  Component Value Date   LABRPR NON REACTIVE 05/16/2021   No results found for: HIV1RNAQUANT, HIV1RNAVL, CD4TABS   #E coli bacteremia 2/2 intraabdominal infection(pericholecystic liver lesion) with mild intra and extrahepatic dilatation #Adenocarcinoma with mets to abdominal LN, lung with likely gallbladder primary  #Renal mass suspicious for renal Ca.  -Pt was initially discharged on 4 weeks of antibiotics with ceftriaxone and metronidazole(EOT 06/08/21) -Readmitted 12/26-12/28 presented with right sided chest pain and admitted for  for possible pneumonia. CT showed ground glass opacities suspicious for infectious vs inflammation. Ceftriaxone changed to cefepime. Antibiotics set to end on 06/09/21. -Seen by Dr. Krista Blue Feng(heme-onc),planned to do PET to evaluate extent of disease and possibly find primary.  Plan: -Complete antibiotic (cefepime+ metronidazole) on 06/09/21 -Pull PICC after last dose -Follow up with heme-onc -Follow-up with ID in 1 month     Laurice Record, MD [facility] 06/08/2021, 3:47 PM

## 2021-06-08 NOTE — Discharge Summary (Signed)
Physician Discharge Summary  Richar Dunklee VZD:[id number] DOB: Sep 25, 1940 DOA: 06/06/2021  PCP: Biagio Borg, MD  Admit date: 06/06/2021 Discharge date: 06/08/2021  Admitted From: home Discharge disposition: home   Recommendations for Outpatient Follow-Up:   Defer PICC line to be pulled to ID-- finished abx on 12/28 Home health Ambulatory referral to cards for SVT New med: coreg   Discharge Diagnosis:   Principal Problem:   Pneumonia Active Problems:   Essential hypertension, benign   GERD (gastroesophageal reflux disease)   COPD GOLD 0   Type 2 diabetes mellitus without complication ([place])   Hyperlipidemia   Renal lesion   Pulmonary embolism ([place])   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  Diet recommendation: Low sodium, heart healthy.  Carbohydrate-modified  Wound care: None.  Code status: Full.   History of Present Illness:   Andre [name] is a 80 y.o. 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 Pain sharp, rated as a 10/10. Pain constant and still present. Nothing made it better or worse. No radiation. He denies any coughing or fever. He didn't take any medication. The pain was so bad he came to ED. Pain is much better.    Hospital Course by Problem:   Chest pain-- essentially resolved in ER-- Doubt Pneumonia -findings on CT concerning for possible pneumonia vs. inflammation. Recently admitted for sepsis with e.coli bacteremia and concerns for liver abscess -differential includes MSK vs.  Pneumonia vs. Viral process vs. Inflammation  -blood cultures NGTD -change rocephin to cefepime, continue flagyl for liver abscess: last day 06/09/21.  -covid/flu negative -pro calcitonin negaive -suspect final diagnosis of MSK pain   SVT (supraventricular tachycardia) ([place]) -found to be in SVT on arrival, patient asymptomatic.  -given adenosine with rate control  -no change in p wave morphology -low dose coreg added -cards referral   Pulmonary embolism ([place]) Compliant with eliquis. 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  Suspicion that this may be gallbladder in origin.  has already seen general surgery where it was discussed at tumor board and felt to be gallbladder in origin. Has seen oncology and is supposed to have a PET scan -also concerns for liver abscess on last hospitalization with bacteremia. Continue flagyl and rocephin to finish course   HFrEF (heart failure with reduced ejection fraction) ([place]) Euvolemic Monitor I/O Echo: 05/13/21: Ef of 40-45% with mildly decreased LV function and global hypokinesis. Indeterminate diastotlic parameters.  -cards referral    Type 2 diabetes mellitus without complication ([place]) Well controlled. Lat A1C in October was 6.4 Continue lifestyle and diet modification.    Renal lesion Concern for malignancy-outpatient follow up Has f/u with Dr Abner Greenspan    GERD (gastroesophageal reflux disease) Continue protonix/carafate    Hyperlipidemia Statin held on last discharge, continue to hold with cancer w/u.    Essential hypertension, benign -well controlled   History of colorectal  cancer in 2009  -s/p resection 08/23/07 showing no residual neoplasm.    COPD  GOLD 0 Stable. On no inhalers. Past history of smoking      Medical Consultants:      Discharge Exam:   Vitals:   06/07/21 2201 06/08/21 0928  BP: 105/69 115/79  Pulse: 77 75  Resp: 18 18  Temp: 98 F (36.7 C) 98.7 F (37.1 C)  SpO2: 96% 100%   Vitals:   06/07/21 1530 06/07/21 1629 06/07/21 2201 06/08/21 0928  BP: 119/80 119/71 105/69 115/79  Pulse: 79 93 77 75  Resp: 17 19 18 18   Temp: 98.1 F (36.7 C) 98 F (36.7 C) 98 F (36.7 C) 98.7 F (37.1 C)  TempSrc:  Oral Oral Oral  SpO2: 100% 99% 96% 100%  Weight:  62 kg    Height:  5\' 11"  (1.803 m)      General exam: Appears calm and comfortable.    The results of significant diagnostics from this hospitalization (including imaging, microbiology, ancillary and laboratory) are listed below for reference.     Procedures and Diagnostic Studies:   CT Angio Chest/Abd/Pel for Dissection W and/or Wo Contrast  Result Date: 06/06/2021 CLINICAL DATA:  Chest and back pain, shortness of breath, aortic dissection suspected EXAM: CT ANGIOGRAPHY CHEST, ABDOMEN AND PELVIS TECHNIQUE: Non-contrast CT of the chest was initially obtained. Multidetector CT imaging through the chest, abdomen and pelvis was performed using the standard protocol during bolus administration of intravenous contrast. Multiplanar reconstructed images and MIPs were obtained and reviewed to evaluate the vascular anatomy. CONTRAST:  59mL OMNIPAQUE IOHEXOL 350 MG/ML SOLN COMPARISON:  CT chest angiogram, CT abdomen pelvis, 05/12/2021, MR abdomen, 05/13/2021 FINDINGS: CTA CHEST FINDINGS Cardiovascular: Preferential opacification of the thoracic aorta. Normal contour and caliber of the thoracic aorta without evidence of aneurysm, dissection, or other acute aortic pathology. Moderate mixed calcific atherosclerosis of the arch and descending thoracic aorta. Cardiomegaly. Three-vessel coronary artery calcifications. No pericardial effusion. Right upper extremity PICC.  Mediastinum/Nodes: No enlarged mediastinal, hilar, or axillary lymph nodes. Thyroid gland, trachea, and esophagus demonstrate no significant findings. Lungs/Pleura: Mild centrilobular emphysema. Scattered ground-glass and heterogeneous airspace opacities in the dependent lungs (series 6, image 36). No pleural effusion or pneumothorax. Musculoskeletal: No chest wall abnormality. No acute or significant osseous findings. Review of the MIP images confirms the above findings. CTA ABDOMEN AND PELVIS FINDINGS VASCULAR Normal contour and caliber of the abdominal aorta. No evidence of aneurysm, dissection, or other acute aortic pathology. Moderate to severe mixed calcific atherosclerosis. Duplicated right renal arteries, with a small accessory inferior pole right renal artery, and a solitary left renal artery origin with early branching of renal arterioles. Otherwise standard branching pattern of the abdominal aorta. Atherosclerosis at the branch vessel origins without significant stenosis. Review of the MIP images confirms the above findings. NON-VASCULAR Hepatobiliary: Redemonstrated contracted, heterogeneous gallbladder with an adjacent hypodense lesion of the right lobe of the liver (series 7, image 169). Pancreas: Unremarkable. No pancreatic ductal dilatation or surrounding inflammatory changes. Spleen: Normal in size without significant abnormality. Adrenals/Urinary Tract: Adrenal glands are unremarkable. Interpolar mass of the midportion of the right kidney again noted, better appreciated by prior MR (series 7, image 185). Bladder is unremarkable. Stomach/Bowel: Stomach is within normal limits. Appendix appears normal. No evidence of bowel wall thickening, distention, or inflammatory changes. Colon is fluid-filled to the rectum. Descending and sigmoid diverticulosis. Lymphatic: No enlarged abdominal or pelvic lymph nodes. Reproductive: No mass or other significant abnormality. Other: No  abdominal wall hernia or  abnormality. No abdominopelvic ascites. Musculoskeletal: No acute or significant osseous findings. Review of the MIP images confirms the above findings. IMPRESSION: 1. Normal contour and caliber of the thoracic and abdominal aorta without evidence of aneurysm, dissection, or other acute aortic pathology. Moderate to severe mixed calcific atherosclerosis. 2. Scattered ground-glass and heterogeneous airspace opacities in the dependent lungs, nonspecific and most likely infectious or inflammatory. Given concern for new pulmonary nodule raised on prior CT angiogram, recommend follow-up CT in 3 months to ensure resolution. 3. Mild centrilobular emphysema. 4. Redemonstrated contracted, heterogeneous gallbladder with an adjacent hypodense lesion of the right lobe of the liver, poorly assessed on this early arterial phase CT and better characterized by prior MR. 5. Interpolar mass of the midportion of the right kidney again noted, in keeping with renal cell carcinoma again better characterized by prior MR. 6. Colon is fluid-filled to the rectum, suggestive of diarrheal illness. 7. Coronary artery disease. Aortic Atherosclerosis (ICD10-I70.0) and Emphysema (ICD10-J43.9). Electronically Signed   By: Delanna Ahmadi M.D.   On: 06/06/2021 14:39     Labs:   Basic Metabolic Panel: Recent Labs  Lab 06/06/21 1338 06/06/21 1356 06/06/21 1826 06/08/21 0331  NA 137 140  --  135  K 4.0 4.1  --  4.1  CL 101 100  --  101  CO2 30  --   --  27  GLUCOSE 102* 99  --  83  BUN 5* 5*  --  5*  CREATININE 1.03 0.90  --  0.71  CALCIUM 9.0  --   --  9.0  MG  --   --  2.0  --    GFR Estimated Creatinine Clearance: 65.7 mL/min (by C-G formula based on SCr of 0.71 mg/dL). Liver Function Tests: Recent Labs  Lab 06/06/21 1338  AST 27  ALT 14  ALKPHOS 140*  BILITOT 1.3*  PROT 6.7  ALBUMIN 3.0*   Recent Labs  Lab 06/06/21 1338  LIPASE 32   No results for input(s): AMMONIA in the last 168 hours. Coagulation  profile No results for input(s): INR, PROTIME in the last 168 hours.  CBC: Recent Labs  Lab 06/06/21 1338 06/06/21 1356 06/08/21 0331  WBC 5.9  --  5.4  NEUTROABS 2.3  --   --   HGB 13.4 14.6 12.6*  HCT 42.1 43.0 37.4*  MCV 91.3  --  89.7  PLT 264  --  194   Cardiac Enzymes: No results for input(s): CKTOTAL, CKMB, CKMBINDEX, TROPONINI in the last 168 hours. BNP: Invalid input(s): POCBNP CBG: No results for input(s): GLUCAP in the last 168 hours. D-Dimer No results for input(s): DDIMER in the last 72 hours. Hgb A1c No results for input(s): HGBA1C in the last 72 hours. Lipid Profile No results for input(s): CHOL, HDL, LDLCALC, TRIG, CHOLHDL, LDLDIRECT in the last 72 hours. Thyroid function studies Recent Labs    06/06/21 1830  TSH 0.648   Anemia work up No results for input(s): VITAMINB12, FOLATE, FERRITIN, TIBC, IRON, RETICCTPCT in the last 72 hours. Microbiology Recent Results (from the past 240 hour(s))  Resp Panel by RT-PCR (Flu A&B, Covid) Nasopharyngeal Swab     Status: None   Collection Time: 06/06/21  4:30 PM   Specimen: Nasopharyngeal Swab; Nasopharyngeal(NP) swabs in vial transport medium  Result Value Ref Range Status   SARS Coronavirus 2 by RT PCR NEGATIVE NEGATIVE Final    Comment: (NOTE) SARS-CoV-2 target nucleic acids are NOT DETECTED.  The SARS-CoV-2  RNA is generally detectable in upper respiratory specimens during the acute phase of infection. The lowest concentration of SARS-CoV-2 viral copies this assay can detect is 138 copies/mL. A negative result does not preclude SARS-Cov-2 infection and should not be used as the sole basis for treatment or other patient management decisions. A negative result may occur with  improper specimen collection/handling, submission of specimen other than nasopharyngeal swab, presence of viral mutation(s) within the areas targeted by this assay, and inadequate number of viral copies(<138 copies/mL). A negative  result must be combined with clinical observations, patient history, and epidemiological information. The expected result is Negative.  Fact Sheet for Patients:  EntrepreneurPulse.com.au  Fact Sheet for Healthcare Providers:  IncredibleEmployment.be  This test is no t yet approved or cleared by the [place] FDA and  has been authorized for detection and/or diagnosis of SARS-CoV-2 by FDA under an Emergency Use Authorization (EUA). This EUA will remain  in effect (meaning this test can be used) for the duration of the COVID-19 declaration under Section 564(b)(1) of the Act, 21 U.S.C.section 360bbb-3(b)(1), unless the authorization is terminated  or revoked sooner.       Influenza A by PCR NEGATIVE NEGATIVE Final   Influenza B by PCR NEGATIVE NEGATIVE Final    Comment: (NOTE) The Xpert Xpress SARS-CoV-2/FLU/RSV plus assay is intended as an aid in the diagnosis of influenza from Nasopharyngeal swab specimens and should not be used as a sole basis for treatment. Nasal washings and aspirates are unacceptable for Xpert Xpress SARS-CoV-2/FLU/RSV testing.  Fact Sheet for Patients: EntrepreneurPulse.com.au  Fact Sheet for Healthcare Providers: IncredibleEmployment.be  This test is not yet approved or cleared by the [place] FDA and has been authorized for detection and/or diagnosis of SARS-CoV-2 by FDA under an Emergency Use Authorization (EUA). This EUA will remain in effect (meaning this test can be used) for the duration of the COVID-19 declaration under Section 564(b)(1) of the Act, 21 U.S.C. section 360bbb-3(b)(1), unless the authorization is terminated or revoked.  Performed at [facility], [place] [address] [place], [place] [address]   Culture, blood (routine x 2)     Status: None (Preliminary result)   Collection Time: 06/06/21  6:26 PM   Specimen: BLOOD  Result Value Ref Range Status    Specimen Description BLOOD LEFT ANTECUBITAL  Final   Special Requests   Final    BOTTLES DRAWN AEROBIC AND ANAEROBIC Blood Culture adequate volume   Culture   Final    NO GROWTH 2 DAYS Performed at [facility], [place] [address] [place], [place] [address]    Report Status PENDING  Incomplete  Culture, blood (routine x 2)     Status: None (Preliminary result)   Collection Time: 06/06/21  6:26 PM   Specimen: BLOOD  Result Value Ref Range Status   Specimen Description BLOOD RIGHT ANTECUBITAL  Final   Special Requests   Final    BOTTLES DRAWN AEROBIC AND ANAEROBIC Blood Culture adequate volume   Culture   Final    NO GROWTH 2 DAYS Performed at [facility], [place] [address], [place], [place] [address]    Report Status PENDING  Incomplete     Discharge Instructions:   Discharge Instructions     Ambulatory referral to Cardiology   Complete by: As directed    Diet Carb Modified   Complete by: As directed    Increase activity slowly   Complete by: As directed  Allergies as of 06/08/2021   No Known Allergies      Medication List     STOP taking these medications    amLODipine 2.5 MG tablet Commonly known as: NORVASC   cefTRIAXone  IVPB Commonly known as: ROCEPHIN       TAKE these medications    acetaminophen 325 MG tablet Commonly known as: TYLENOL Take 2 tablets (650 mg total) by mouth every 6 (six) hours as needed for mild pain (or Fever >/= 101).   aspirin EC 81 MG tablet Take 81 mg by mouth daily. Swallow whole.   carvedilol 3.125 MG tablet Commonly known as: COREG Take 1 tablet (3.125 mg total) by mouth 2 (two) times daily with a meal.   diclofenac Sodium 1 % Gel Commonly known as: VOLTAREN Apply 2 g topically 4 (four) times daily.   Eliquis DVT/PE Starter Pack Generic drug: Apixaban Starter Pack (10mg  and 5mg ) Take as directed on package: start with two-5mg  tablets twice daily for 7 days. On day 8, switch to one-5mg  tablet twice  daily. What changed:  how much to take how to take this when to take this   metroNIDAZOLE 500 MG tablet Commonly known as: Flagyl Take 1 tablet (500 mg total) by mouth 2 (two) times daily for 20 days. Stop date 06/09/21   mirtazapine 7.5 MG tablet Commonly known as: REMERON Take 1 tablet (7.5 mg total) by mouth at bedtime. What changed: when to take this   pantoprazole 40 MG tablet Commonly known as: Protonix Take 1 tablet (40 mg total) by mouth daily.   sucralfate 1 g tablet Commonly known as: Carafate Take 1 tablet (1 g total) by mouth 4 (four) times daily.          Time coordinating discharge: 35 min  Signed:  Geradine Girt DO  [facility] 06/08/2021, 12:07 PM

## 2021-06-09 NOTE — TOC Transition Note (Signed)
Transition of Care [facility]) - CM/SW Discharge Note   Patient Details  Name: Andre [name] MRN: [id number] Date of Birth: December 12, 1940  [facility]) CM/SW Contact:  Tom-Johnson, Renea Ee, RN Phone Number: 06/09/2021, 11:29 AM   Clinical Narrative:    Patient is scheduled for discharge. PT recommended home health PT and referral made with [facility] per patient's request. CM spoke with Andre [name] [phone]) and voiced acceptance. Daughter [name] to transport at discharge. No further TOC needs noted.   Final next level of care: [place] Barriers to Discharge: Barriers Resolved   Patient Goals and CMS Choice Patient states their goals for this hospitalization and ongoing recovery are:: To go home CMS Medicare.gov Compare Post Acute Care list provided to:: Patient Choice offered to / list presented to : Patient  Discharge Placement                       Discharge Plan and Services                DME Arranged: N/A DME Agency: NA       HH Arranged: PT HH Agency: [facility] Date [place]: 06/08/21 Time [place]: 9179 Representative spoke with at [place]: Andre [name]  Social Determinants of Health ([place]) Interventions     Readmission Risk Interventions No flowsheet data found.

## 2021-06-11 LAB — CULTURE, BLOOD (ROUTINE X 2)
Culture: NO GROWTH
Culture: NO GROWTH
Special Requests: ADEQUATE
Special Requests: ADEQUATE

## 2021-06-11 NOTE — ED Provider Notes (Signed)
.  Cardioversion  Date/Time: 06/11/2021 3:50 PM Performed by: Deno Etienne, DO Authorized by: Deno Etienne, DO   Consent:    Consent obtained:  Verbal   Consent given by:  Patient   Risks discussed:  Induced arrhythmia and pain   Alternatives discussed:  No treatment, rate-control medication and alternative treatment Pre-procedure details:    Cardioversion basis:  Emergent   Rhythm:  Supraventricular tachycardia   Electrode placement:  Anterior-posterior Patient sedated: No Attempt one:    Cardioversion mode attempt one: adenosine.   Shock outcome:  Conversion to normal sinus rhythm Post-procedure details:    Patient status:  Awake   Patient tolerance of procedure:  Tolerated well, no immediate complications Comments:     Converted chemically.       Deno Etienne, DO 06/11/21 [phone]

## 2021-06-14 ENCOUNTER — Inpatient Hospital Stay: Payer: Medicare Other | Attending: Hematology | Admitting: Nutrition

## 2021-06-14 ENCOUNTER — Other Ambulatory Visit: Payer: Self-pay

## 2021-06-14 ENCOUNTER — Telehealth: Payer: Self-pay | Admitting: Dietician

## 2021-06-14 DIAGNOSIS — C3412 Malignant neoplasm of upper lobe, left bronchus or lung: Secondary | ICD-10-CM | POA: Insufficient documentation

## 2021-06-14 DIAGNOSIS — Z7982 Long term (current) use of aspirin: Secondary | ICD-10-CM | POA: Insufficient documentation

## 2021-06-14 DIAGNOSIS — Z8673 Personal history of transient ischemic attack (TIA), and cerebral infarction without residual deficits: Secondary | ICD-10-CM | POA: Insufficient documentation

## 2021-06-14 DIAGNOSIS — N2889 Other specified disorders of kidney and ureter: Secondary | ICD-10-CM | POA: Insufficient documentation

## 2021-06-14 DIAGNOSIS — I2510 Atherosclerotic heart disease of native coronary artery without angina pectoris: Secondary | ICD-10-CM | POA: Insufficient documentation

## 2021-06-14 DIAGNOSIS — C772 Secondary and unspecified malignant neoplasm of intra-abdominal lymph nodes: Secondary | ICD-10-CM | POA: Insufficient documentation

## 2021-06-14 DIAGNOSIS — K8060 Calculus of gallbladder and bile duct with cholecystitis, unspecified, without obstruction: Secondary | ICD-10-CM | POA: Insufficient documentation

## 2021-06-14 DIAGNOSIS — Z79899 Other long term (current) drug therapy: Secondary | ICD-10-CM | POA: Insufficient documentation

## 2021-06-14 DIAGNOSIS — I10 Essential (primary) hypertension: Secondary | ICD-10-CM | POA: Insufficient documentation

## 2021-06-14 DIAGNOSIS — C778 Secondary and unspecified malignant neoplasm of lymph nodes of multiple regions: Secondary | ICD-10-CM | POA: Insufficient documentation

## 2021-06-14 DIAGNOSIS — K5730 Diverticulosis of large intestine without perforation or abscess without bleeding: Secondary | ICD-10-CM | POA: Insufficient documentation

## 2021-06-14 DIAGNOSIS — J432 Centrilobular emphysema: Secondary | ICD-10-CM | POA: Insufficient documentation

## 2021-06-14 DIAGNOSIS — K219 Gastro-esophageal reflux disease without esophagitis: Secondary | ICD-10-CM | POA: Insufficient documentation

## 2021-06-14 DIAGNOSIS — N400 Enlarged prostate without lower urinary tract symptoms: Secondary | ICD-10-CM | POA: Insufficient documentation

## 2021-06-14 DIAGNOSIS — C787 Secondary malignant neoplasm of liver and intrahepatic bile duct: Secondary | ICD-10-CM

## 2021-06-14 DIAGNOSIS — Z85048 Personal history of other malignant neoplasm of rectum, rectosigmoid junction, and anus: Secondary | ICD-10-CM | POA: Insufficient documentation

## 2021-06-14 DIAGNOSIS — K760 Fatty (change of) liver, not elsewhere classified: Secondary | ICD-10-CM | POA: Insufficient documentation

## 2021-06-14 DIAGNOSIS — I700 Atherosclerosis of aorta: Secondary | ICD-10-CM | POA: Insufficient documentation

## 2021-06-14 DIAGNOSIS — R634 Abnormal weight loss: Secondary | ICD-10-CM | POA: Insufficient documentation

## 2021-06-14 DIAGNOSIS — Z7901 Long term (current) use of anticoagulants: Secondary | ICD-10-CM | POA: Insufficient documentation

## 2021-06-14 NOTE — Telephone Encounter (Signed)
Called for scheduled telephone visit.  Called daughter which is same as mobile for patient. Left message to return call on voice mail.  April Manson, RDN, LDN Registered Dietitian, [place] Part Time Remote (Usual office hours: Tuesday-Thursday) Cell: [phone]

## 2021-06-14 NOTE — Telephone Encounter (Addendum)
First called daughter cell for scheduled telephone nutrition consult.Left message on her voice mail.  Called patient at his home number. He wanted to reschedule telephone nutrition assessment when his daughter could be with him. I let him know I left my cell number on daughter's voice mail.  I tried to verify the email we have on file but he couldn't verify the information. He said his daughter would reschedule.  April Manson, RDN, LDN Registered Dietitian, [place] Part Time Remote (Usual office hours: Tuesday-Thursday) Cell: [phone]

## 2021-06-15 ENCOUNTER — Telehealth: Payer: Self-pay

## 2021-06-15 ENCOUNTER — Inpatient Hospital Stay: Payer: Medicare Other

## 2021-06-15 ENCOUNTER — Other Ambulatory Visit: Payer: Self-pay

## 2021-06-15 ENCOUNTER — Encounter: Payer: Self-pay | Admitting: Hematology

## 2021-06-15 ENCOUNTER — Inpatient Hospital Stay (HOSPITAL_BASED_OUTPATIENT_CLINIC_OR_DEPARTMENT_OTHER): Payer: Medicare Other | Admitting: Hematology

## 2021-06-15 ENCOUNTER — Ambulatory Visit: Payer: Medicare Other

## 2021-06-15 VITALS — BP 115/74 | HR 94 | Temp 98.6°F | Resp 18 | Wt 134.8 lb

## 2021-06-15 DIAGNOSIS — Z7982 Long term (current) use of aspirin: Secondary | ICD-10-CM | POA: Diagnosis not present

## 2021-06-15 DIAGNOSIS — K760 Fatty (change of) liver, not elsewhere classified: Secondary | ICD-10-CM | POA: Diagnosis not present

## 2021-06-15 DIAGNOSIS — C778 Secondary and unspecified malignant neoplasm of lymph nodes of multiple regions: Secondary | ICD-10-CM | POA: Diagnosis not present

## 2021-06-15 DIAGNOSIS — C799 Secondary malignant neoplasm of unspecified site: Secondary | ICD-10-CM

## 2021-06-15 DIAGNOSIS — I2510 Atherosclerotic heart disease of native coronary artery without angina pectoris: Secondary | ICD-10-CM | POA: Diagnosis not present

## 2021-06-15 DIAGNOSIS — Z79899 Other long term (current) drug therapy: Secondary | ICD-10-CM | POA: Diagnosis not present

## 2021-06-15 DIAGNOSIS — K8060 Calculus of gallbladder and bile duct with cholecystitis, unspecified, without obstruction: Secondary | ICD-10-CM | POA: Diagnosis not present

## 2021-06-15 DIAGNOSIS — C20 Malignant neoplasm of rectum: Secondary | ICD-10-CM | POA: Diagnosis not present

## 2021-06-15 DIAGNOSIS — Z8673 Personal history of transient ischemic attack (TIA), and cerebral infarction without residual deficits: Secondary | ICD-10-CM | POA: Diagnosis not present

## 2021-06-15 DIAGNOSIS — K5730 Diverticulosis of large intestine without perforation or abscess without bleeding: Secondary | ICD-10-CM | POA: Diagnosis not present

## 2021-06-15 DIAGNOSIS — I700 Atherosclerosis of aorta: Secondary | ICD-10-CM | POA: Diagnosis not present

## 2021-06-15 DIAGNOSIS — N400 Enlarged prostate without lower urinary tract symptoms: Secondary | ICD-10-CM | POA: Diagnosis not present

## 2021-06-15 DIAGNOSIS — C772 Secondary and unspecified malignant neoplasm of intra-abdominal lymph nodes: Secondary | ICD-10-CM | POA: Diagnosis not present

## 2021-06-15 DIAGNOSIS — C801 Malignant (primary) neoplasm, unspecified: Secondary | ICD-10-CM

## 2021-06-15 DIAGNOSIS — K219 Gastro-esophageal reflux disease without esophagitis: Secondary | ICD-10-CM | POA: Diagnosis not present

## 2021-06-15 DIAGNOSIS — Z7901 Long term (current) use of anticoagulants: Secondary | ICD-10-CM | POA: Diagnosis not present

## 2021-06-15 DIAGNOSIS — Z85048 Personal history of other malignant neoplasm of rectum, rectosigmoid junction, and anus: Secondary | ICD-10-CM | POA: Diagnosis not present

## 2021-06-15 DIAGNOSIS — N2889 Other specified disorders of kidney and ureter: Secondary | ICD-10-CM | POA: Diagnosis not present

## 2021-06-15 DIAGNOSIS — I10 Essential (primary) hypertension: Secondary | ICD-10-CM | POA: Diagnosis not present

## 2021-06-15 DIAGNOSIS — J432 Centrilobular emphysema: Secondary | ICD-10-CM | POA: Diagnosis not present

## 2021-06-15 DIAGNOSIS — R634 Abnormal weight loss: Secondary | ICD-10-CM | POA: Diagnosis not present

## 2021-06-15 DIAGNOSIS — C3412 Malignant neoplasm of upper lobe, left bronchus or lung: Secondary | ICD-10-CM | POA: Diagnosis not present

## 2021-06-15 LAB — CBC WITH DIFFERENTIAL (CANCER CENTER ONLY)
Abs Immature Granulocytes: 0.01 10*3/uL (ref 0.00–0.07)
Basophils Absolute: 0 10*3/uL (ref 0.0–0.1)
Basophils Relative: 1 %
Eosinophils Absolute: 0.1 10*3/uL (ref 0.0–0.5)
Eosinophils Relative: 1 %
HCT: 45.7 % (ref 39.0–52.0)
Hemoglobin: 15.3 g/dL (ref 13.0–17.0)
Immature Granulocytes: 0 %
Lymphocytes Relative: 36 %
Lymphs Abs: 2 10*3/uL (ref 0.7–4.0)
MCH: 29.9 pg (ref 26.0–34.0)
MCHC: 33.5 g/dL (ref 30.0–36.0)
MCV: 89.3 fL (ref 80.0–100.0)
Monocytes Absolute: 0.7 10*3/uL (ref 0.1–1.0)
Monocytes Relative: 12 %
Neutro Abs: 2.9 10*3/uL (ref 1.7–7.7)
Neutrophils Relative %: 50 %
Platelet Count: 242 10*3/uL (ref 150–400)
RBC: 5.12 MIL/uL (ref 4.22–5.81)
RDW: 16.9 % — ABNORMAL HIGH (ref 11.5–15.5)
WBC Count: 5.7 10*3/uL (ref 4.0–10.5)
nRBC: 0 % (ref 0.0–0.2)

## 2021-06-15 LAB — CMP (CANCER CENTER ONLY)
ALT: 16 U/L (ref 0–44)
AST: 31 U/L (ref 15–41)
Albumin: 3.7 g/dL (ref 3.5–5.0)
Alkaline Phosphatase: 192 U/L — ABNORMAL HIGH (ref 38–126)
Anion gap: 5 (ref 5–15)
BUN: 11 mg/dL (ref 8–23)
CO2: 30 mmol/L (ref 22–32)
Calcium: 9.8 mg/dL (ref 8.9–10.3)
Chloride: 100 mmol/L (ref 98–111)
Creatinine: 0.75 mg/dL (ref 0.61–1.24)
GFR, Estimated: >60 mL/min (ref >60)
Glucose, Bld: 97 mg/dL (ref 70–99)
Potassium: 4 mmol/L (ref 3.5–5.1)
Sodium: 135 mmol/L (ref 135–145)
Total Bilirubin: 1.1 mg/dL (ref 0.3–1.2)
Total Protein: 8.4 g/dL — ABNORMAL HIGH (ref 6.5–8.1)

## 2021-06-15 MED ORDER — ONDANSETRON HCL 8 MG PO TABS: 8.0000 mg | ORAL_TABLET | Freq: Two times a day (BID) | ORAL | 1 refills | Status: DC | PRN

## 2021-06-15 MED ORDER — TRAMADOL HCL 50 MG PO TABS: 50.0000 mg | ORAL_TABLET | Freq: Four times a day (QID) | ORAL | 0 refills | Status: DC | PRN

## 2021-06-15 MED ORDER — PROCHLORPERAZINE MALEATE 10 MG PO TABS: 10.0000 mg | ORAL_TABLET | Freq: Four times a day (QID) | ORAL | 1 refills | Status: DC | PRN

## 2021-06-15 MED ORDER — LIDOCAINE-PRILOCAINE 2.5-2.5 % EX CREA: TOPICAL_CREAM | CUTANEOUS | 3 refills | Status: DC

## 2021-06-15 NOTE — Progress Notes (Signed)
[place]   [phone] [phone]   Clinic Follow up Note   Patient Care Team: Biagio Borg, MD as PCP - General (Internal Medicine) Driscoll, P.A. as Consulting Physician (Ophthalmology) Marcial Pacas, MD as Consulting Physician (Neurology) Szabat, Darnelle Maffucci, [facility] as Pharmacist (Pharmacist) Truitt Merle, MD as Consulting Physician (Oncology) Royston Bake, RN as Oncology Nurse Navigator (Oncology)  Date of Service:  06/15/2021  CHIEF COMPLAINT: f/u of metastatic adenocarcinoma  CURRENT THERAPY:  PENDING cisplatin/gemcitabine with durvalumab   ASSESSMENT & PLAN:  Andre [name] is a 81 y.o. male with   1.  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 CT AP 04/03/21 showed 3 cm right renal mass, abnormal gallbladder with stones, and upper abdominal adenopathy. CT chest showed a 1.1cm lung nodule in LUL -he developed sepsis and was admitted 05/12/21. Abdomen MRI showed: cholelithiasis, pericholecystic liver lesion, upper abdominal necrotic adenopathy, mild biliary duct dilatation, right renal mass, mild edema in pararenal space. -EUS biopsy on 05/18/21 showed metastatic adenocarcinoma to para-pancreatic lymph node. Will discuss with path to see if adequate sample for studies   -they met with Dr. Zenia Resides on 05/31/21, who discussed that he is not eligible for surgery given the degree of lymphadenopathy and extension. -His case was discussed in tumor conference on 12/21. 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  -he was scheduled for PET scan but was admitted for pneumonia 12/26-12/28/22. He did undergo CT CAP as inpatient showing: pulmonary nodules most likely infectious or inflammatory; gallbladder, liver, and renal masses better seen on MRI. -I reviewed the work up thus far with them. This is  most consistent with locally advanced gallbladder cancer with probable liver invasion and regional lymph nodes at metastasis, and that his cancer is not resectable.   -I recommend him to proceed with chemotherapy with cisplatin/gemcitabine with durvalumab.  Due to his advanced age and limited performance status, we will start chemo at q2weeks, if he tolerates well, be change to day 1, 8 every 21 days --Chemotherapy consent: Side effects including but does not not limited to, fatigue, nausea, vomiting, diarrhea, hair loss, neuropathy, fluid retention, renal and kidney dysfunction, neutropenic fever, needed for blood transfusion, bleeding, were discussed with patient in great detail. He agrees to proceed. -The goal of therapy is palliative to prolong his life -he is scheduled for port placement on 06/20/21 with Dr. Zenia Resides, which daughter [name] was not aware of. If they can make that work transportation-wise, they will keep the appointment. I will also have RN Santiago Glad discuss port with them today. -PET has been rescheduled for 06/24/21. -daughter [name] expressed desire for him to start treatment on 06/27/21 as she is off all that day and can be with him. We will work to make this happen.  2. Symptom Management: Left abdominal pain, Weight loss -I prescribed mirtazapine for his appetite and sleep on 05/26/21. He feels it is helping a little; we will increase his dose. -he reports abdominal pain/discomfort not relieved by tylenol. I will call in tramadol for him today.   2. Right renal mass  -probably renal cell carcinoma based on images, biopsy not due yet -seen by urologist Dr. Abner Greenspan -will decide management after the other metastatic cancer work up    3. H/o colorectal cancer 2009 -s/p resection 08/23/07 showing no residual neoplasm.   4.  HTN, GERD -continue medication      PLAN:  -increase mirtazapine dose -I called in tramadol for him -port placement 06/20/21, which may need to be changed  -chemo  education class -PET scan on 06/24/21 -plan to start chemo week of 06/27/21, patient's daughter would prefer Monday due to the holiday and being off work. -Plan to give chemo every 2 weeks, and durvalumab every 4 weeks for the first few cycles    No problem-specific Assessment & Plan notes found for this encounter.   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 Enhancing right renal mass measuring 3 x 2.6 cm, highly suspicious for renal cell carcinoma. Recommend urology consultation. 2. Abnormal appearance of the gallbladder which is only partially distended. There is intraluminal density as well as suggestion of diffuse wall thickening. The adjacent liver parenchyma demonstrates decreased density which may be edema. In addition, there is intra and extrahepatic biliary ductal dilatation. No visualized choledocholithiasis. Recommend correlation with LFTs, as well as further assessment with MRCP. MRCP should only be considered when patient is able to tolerate breath hold technique 3. Upper abdominal adenopathy, some of which appears low-density/necrotic, measuring up to 16 mm. These are nonspecific, given renal and gallbladder findings, metastatic disease is not entirely excluded. 4. Presumed contrast mixing in the renal veins and IVC. 5. Colonic diverticulosis without diverticulitis. 6. Aortic and branch atherosclerosis. 7. Mildly enlarged prostate gland.   04/04/2021 Imaging   EXAM: ULTRASOUND ABDOMEN LIMITED RIGHT UPPER QUADRANT  IMPRESSION: 1. Cholelithiasis with findings suggestive of acute cholecystitis. A hepatobiliary scintigraphy may provide better evaluation of the gallbladder if there is a high clinical concern for  acute cholecystitis . 2. Fatty liver. 3. Right renal mass.   05/12/2021 Imaging   EXAM: CT ANGIOGRAPHY CHEST WITH CONTRAST  IMPRESSION: 1. Small amount of pulmonary embolism within a subsegmental lower lobe branch of the left pulmonary artery. 2. 1.1 cm noncalcified lung nodule within the posterior aspect of the left upper lobe. This represents a new finding when compared to the prior exam. Consider one of the following in 3 months for both low-risk and high-risk individuals: (a) repeat chest CT, (b) follow-up PET-CT, or (c) tissue sampling. This recommendation follows the consensus statement: Guidelines for Management of Incidental Pulmonary Nodules Detected on CT Images: From the Fleischner Society 2017; Radiology 2017; [id number].   05/12/2021 Imaging   EXAM: CT ABDOMEN AND PELVIS WITH CONTRAST  IMPRESSION: Increase in the inflammatory changes surrounding the gallbladder and extending into the adjacent liver again suspicious for acute cholecystitis with localized hepatic inflammatory change. Correlate with pending ultrasound exam   Upper abdominal lymphadenopathy which appears necrotic and stable in appearance from the prior exam. Patient is scheduled for PET-CT according to outpatient clinic notes.   Stable enhancing right renal mass suspicious for renal cell carcinoma. By history the patient has met with urology.   Remainder of the exam is stable from the prior study.   05/12/2021 Imaging   EXAM: ULTRASOUND ABDOMEN LIMITED RIGHT UPPER QUADRANT  IMPRESSION: Stable changes in the gallbladder with cholelithiasis and gallbladder wall thickening. Negative sonographic Murphy's sign is elicited however. This may be in part due to underlying medications.   05/13/2021 Imaging   EXAM: MRI ABDOMEN WITHOUT AND WITH CONTRAST (INCLUDING MRCP)  IMPRESSION: 1. Motion degraded images. 2. Cholelithiasis. Suggestion of wall thickening in the setting of underdistention. Development of  a pericholecystic liver lesion which is suspicious for extension of chronic cholecystitis. Direct extension of gallbladder carcinoma felt less likely, given absence of dominant gallbladder mass. 3. Upper abdominal necrotic adenopathy, present back to 04/03/2021. Outpatient PET may be informative to direct sampling. 4. Mild intra and extrahepatic biliary duct dilatation. Equivocal findings for distal common duct stone, given above limitations. If bilirubin is elevated, consider ERCP. 5. Interpolar right renal enhancing mass, consistent with renal cell carcinoma. 6. Mild edema within the anterior pararenal space. Correlate with pancreatic enzyme levels to exclude mild pancreatitis. 7. Right base airspace disease, new since yesterday's CT. Favor atelectasis.   05/18/2021 Procedure   Upper EUS, Dr. Benson Norway  Findings: Multiple malignant-appearing lymph nodes were visualized in the peripancreatic region. These were fifteen mm from the primary tumor. The largest measured 10 mm by 20 mm in maximal cross-sectional diameter. The nodes were oval, heterogenous and had well defined margins. Fine needle aspiration for cytology was performed. Color Doppler imaging was utilized prior to needle puncture to confirm a lack of significant vascular structures within the needle path. Five passes were made with the 25 gauge needle using a transgastric approach. A stylet was used. A cytotechnologist was present to evaluate the adequacy of the specimen. Final cytology results are pending.   Multiple peripancreatic lymph nodes were identified. The largest one viewed measured approximatly 10 mm x 20 mm. Multiple passes with the 25 gauge FNA needle were performed through these lymph nodes and an adequate sampling was obtained. In the hilar region of the liver a 20 - 30 mm node/mass/abnormality was identified. It was hyperechoic. The gallbladder was not visualized after multiple attempts with repositioning and tracing the CBD  proximally. There was an area near this lesion that was anechoic, potentially being a portion of the gallbladder. This area was adjacent or in the path of a potential FNA. With this uncertainty about the location of the gallbladder in relation to his lesion, an FNA was not performed.  Impression: - Multiple malignant-appearing lymph nodes were visualized in the peripancreatic region. Fine needle aspiration performed.   05/18/2021 Pathology Results   A. 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 Normal contour and caliber of the thoracic and abdominal aorta without evidence of aneurysm, dissection, or other acute aortic pathology. Moderate to severe mixed calcific atherosclerosis. 2. Scattered ground-glass and heterogeneous airspace opacities in the dependent lungs, nonspecific and most likely infectious or inflammatory. Given concern for new pulmonary nodule raised on prior CT angiogram, recommend follow-up CT in 3 months to ensure resolution. 3. Mild centrilobular emphysema. 4. Redemonstrated contracted, heterogeneous gallbladder with an adjacent hypodense lesion of the right lobe of the liver, poorly assessed on this early arterial phase CT and better characterized by prior MR. 5. Interpolar mass of the midportion of the right kidney again noted, in keeping with renal cell carcinoma again better characterized by prior MR. 6. Colon is fluid-filled to the rectum, suggestive of diarrheal illness. 7. Coronary artery disease.  Aortic Atherosclerosis (ICD10-I70.0) and Emphysema (ICD10-J43.9).   06/27/2021 -  Chemotherapy   Patient is on Treatment Plan : BILIARY TRACT Cisplatin + Gemcitabine D1,8 q21d     06/27/2021 -  Chemotherapy   Patient is on Treatment Plan : BLADDER Durvalumab q14d        INTERVAL HISTORY:  Andre [name] is here for a follow up of metastatic adenocarcinoma. He was last seen by me on 05/26/21 in consultation. He presents to the clinic accompanied by his daughter, [name]. He has been home for about a week since his hospitalization for pneumonia. He notes he still has a picc line in place, and that his son has been flushing it. His daughter brought up that Raekwon is in a lot of pain from the cancer in his gallbladder. He rates the pain at 8-9/10 at its [place] adds that he has been laying in the bed a lot because of the pain.   All other systems were reviewed with the patient and are negative.  MEDICAL HISTORY:  Past Medical History:  Diagnosis Date   Arthritis    Coronary artery disease, non-occlusive    a. 06/2011: cath for abnormal exercise echo: Only 30-40% proximal RCA. Otherwise normal. b. 10/2015: cath for abnormal NST and atypical CP --> showed mild 30-40% stenosis in the prox-distal RCA   GERD (gastroesophageal reflux disease)    HA (headache)    Hypertension    Memory loss    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[place])    TIA (transient ischemic attack)    Weakness     SURGICAL HISTORY: Past Surgical History:  Procedure Laterality Date   CARDIAC CATHETERIZATION  January 2013   Proximal RCA 30-40%. Otherwise normal.   CARDIAC CATHETERIZATION N/A 10/18/2015   Procedure: Left Heart Cath and Coronary Angiography;  Surgeon: Jettie Booze, MD;  Location: [facility];  Service: Cardiovascular;  Laterality: N/A;   ESOPHAGOGASTRODUODENOSCOPY (EGD) WITH PROPOFOL N/A 05/18/2021   Procedure: ESOPHAGOGASTRODUODENOSCOPY (EGD) WITH PROPOFOL;  Surgeon: Carol Ada, MD;  Location: [facility];   Service: Endoscopy;  Laterality: N/A;   EUS Left 05/18/2021   Procedure: UPPER ENDOSCOPIC ULTRASOUND (EUS) LINEAR;  Surgeon: Carol Ada, MD;  Location: [facility];  Service: Endoscopy;  Laterality: Left;   FINE NEEDLE ASPIRATION  05/18/2021   Procedure: FINE NEEDLE ASPIRATION (FNA) LINEAR;  Surgeon: Carol Ada, MD;  Location: [facility];  Service: Endoscopy;;   NM MYOVIEW LTD  March 2015   LOW RISK. No scar or ischemia. Normal EF (55%). No RWMA   surgical excision of rectal cancer      I have reviewed the social history and family history with the patient and they are unchanged from previous note.  ALLERGIES:  has No Known Allergies.  MEDICATIONS:  Current Outpatient Medications  Medication Sig Dispense Refill   traMADol (ULTRAM) 50 MG tablet Take 1 tablet (50 mg total) by mouth every 6 (six) hours as needed. 20 tablet 0   acetaminophen (TYLENOL) 325 MG tablet Take 2 tablets (650 mg total) by mouth every 6 (six) hours as needed for mild pain (or Fever >/= 101).     APIXABAN (ELIQUIS) VTE STARTER PACK (10MG AND 5MG) Take as directed on package: start with two-48m tablets twice daily for 7 days. On day 8, switch to one-519mtablet twice daily. (Patient taking differently: Take 5 mg by mouth in the morning and at bedtime. Take as directed on package: start with two-59m79mablets twice daily for 7 days. On day 8, switch  to one-7m tablet twice daily.) 74 each 0   aspirin EC 81 MG tablet Take 81 mg by mouth daily. Swallow whole.     carvedilol (COREG) 3.125 MG tablet Take 1 tablet (3.125 mg total) by mouth 2 (two) times daily with a meal. 60 tablet 0   diclofenac Sodium (VOLTAREN) 1 % GEL Apply 2 g topically 4 (four) times daily.     lidocaine-prilocaine (EMLA) cream Apply to affected area once 30 g 3   mirtazapine (REMERON) 7.5 MG tablet Take 1 tablet (7.5 mg total) by mouth at bedtime. (Patient taking differently: Take 7.5 mg by mouth daily.) 30 tablet 1   ondansetron (ZOFRAN) 8 MG tablet  Take 1 tablet (8 mg total) by mouth 2 (two) times daily as needed. Start on the third day after cisplatin chemotherapy. 30 tablet 1   pantoprazole (PROTONIX) 40 MG tablet Take 1 tablet (40 mg total) by mouth daily. 30 tablet 1   prochlorperazine (COMPAZINE) 10 MG tablet Take 1 tablet (10 mg total) by mouth every 6 (six) hours as needed (Nausea or vomiting). 30 tablet 1   No current facility-administered medications for this visit.    PHYSICAL EXAMINATION: ECOG PERFORMANCE STATUS: 2 - Symptomatic, <50% confined to bed  Vitals:   06/15/21 1416 06/15/21 1523  BP: 120/75 115/74  Pulse: (!) 56 94  Resp: 17 18  Temp: 98 F (36.7 C) 98.6 F (37 C)  SpO2: 92% 97%   Wt Readings from Last 3 Encounters:  06/15/21 134 lb 12.8 oz (61.1 kg)  06/08/21 146 lb (66.2 kg)  06/07/21 136 lb 11 oz (62 kg)     GENERAL:alert, no distress and comfortable SKIN: skin color, texture, turgor are normal, no rashes or significant lesions EYES: normal, Conjunctiva are pink and non-injected, sclera clear LUNGS: clear to auscultation and percussion with normal breathing effort HEART: regular rate & rhythm and no murmurs and no lower extremity edema ABDOMEN: (+) hepatomegaly with tenderness to palpation Musculoskeletal:no cyanosis of digits and no clubbing  NEURO: alert & oriented x 3 with fluent speech, no focal motor/sensory deficits  LABORATORY DATA:  I have reviewed the data as listed CBC Latest Ref Rng & Units 06/15/2021 06/08/2021 06/06/2021  WBC 4.0 - 10.5 K/uL 5.7 5.4 -  Hemoglobin 13.0 - 17.0 g/dL 15.3 12.6(L) 14.6  Hematocrit 39.0 - 52.0 % 45.7 37.4(L) 43.0  Platelets 150 - 400 K/uL 242 194 -     CMP Latest Ref Rng & Units 06/15/2021 06/08/2021 06/06/2021  Glucose 70 - 99 mg/dL 97 83 99  BUN 8 - 23 mg/dL 11 5(L) 5(L)  Creatinine 0.61 - 1.24 mg/dL 0.75 0.71 0.90  Sodium 135 - 145 mmol/L 135 135 140  Potassium 3.5 - 5.1 mmol/L 4.0 4.1 4.1  Chloride 98 - 111 mmol/L 100 101 100  CO2 22 - 32 mmol/L  30 27 -  Calcium 8.9 - 10.3 mg/dL 9.8 9.0 -  Total Protein 6.5 - 8.1 g/dL 8.4(H) - -  Total Bilirubin 0.3 - 1.2 mg/dL 1.1 - -  Alkaline Phos 38 - 126 U/L 192(H) - -  AST 15 - 41 U/L 31 - -  ALT 0 - 44 U/L 16 - -      RADIOGRAPHIC STUDIES: I have personally reviewed the radiological images as listed and agreed with the findings in the report. No results found.    Orders Placed This Encounter  Procedures   Magnesium    Standing Status:   Standing    Number  of Occurrences:   20    Standing Expiration Date:   06/15/2022   All questions were answered. The patient knows to call the clinic with any problems, questions or concerns. No barriers to learning was detected. The total time spent in the appointment was 40 minutes.     Truitt Merle, MD 06/15/2021   I, Wilburn Mylar, am acting as scribe for Truitt Merle, MD.   I have reviewed the above documentation for accuracy and completeness, and I agree with the above.

## 2021-06-15 NOTE — Progress Notes (Signed)
START OFF PATHWAY REGIMEN - Other   [id number]:Cisplatin IV D1,8 + Durvalumab 1,500 mg IV D1 + Gemcitabine IV D1,8 q21 Days for up to 8 Cycles Followed by Durvalumab 1,500 mg IV D1 q28 Days:   Cycles 1 through up to 8: A cycle is every 21 days:     Durvalumab      Gemcitabine      Cisplatin    Cycles 9 and beyond: A cycle is every 28 days:     Durvalumab   **Always confirm dose/schedule in your pharmacy ordering system**  Patient Characteristics: Intent of Therapy: Non-Curative / Palliative Intent, Discussed with Patient

## 2021-06-15 NOTE — Telephone Encounter (Signed)
Called to try to reschedule previous appointment, no answer, left message for patient / daughter to return call to reschedule

## 2021-06-15 NOTE — Telephone Encounter (Signed)
I called patient's daughter back to offer an appointment with our office tomorrow when Jinny Blossom returns to have picc removed.  Patient's daughter stated patient is currently at the cancer center and they will remove the picc line today.  I called and left voicemail for Jeani Hawking to let him know oncology will remove picc line for patient today. Preslyn Warr T Brooks Sailors

## 2021-06-15 NOTE — Progress Notes (Signed)
Removed RT SL 25fr BARD PICC without incidents per MD orders.  40cms removed with catheter tip intact.  Pt asked to bare down while PICC being removed.  Pt remained flat for 97mins post removal.  Post VS WML.  Vaseline gauze, sterile 2x2, and occlusive dressing applied to insertion site.  Pt and pt's daughter given instructions on how to care for old PICC site.  Pt and daughter verbalized understanding of instructions.

## 2021-06-15 NOTE — Telephone Encounter (Signed)
Patient's daughter [name] called regarding patient's picc line. Latoya states picc line has not been removed, picc line has not been flushed daily since he stopped IV antibiotics. Per patient's daughter she was not aware that the picc line needed to be flushed.   Patient completed IV antibiotics on 06/09/21. Per OV note on 06/08/21 patient was to have picc removed after last dose on 06/09/21.  I gave Jeani Hawking verbal orders to have picc removed asap for the patient per Dr. Candiss Norse OV note. Jeani Hawking stated he was unsure when [facility] will be out to remove the picc line.  Taronda Comacho T Brooks Sailors

## 2021-06-16 ENCOUNTER — Ambulatory Visit: Payer: Self-pay | Admitting: Surgery

## 2021-06-16 ENCOUNTER — Encounter (HOSPITAL_COMMUNITY): Payer: Self-pay | Admitting: Surgery

## 2021-06-16 NOTE — Progress Notes (Signed)
DUE TO COVID-19 ONLY ONE VISITOR IS ALLOWED TO COME WITH YOU AND STAY IN THE WAITING ROOM ONLY DURING PRE OP AND PROCEDURE DAY OF SURGERY.   PCP - Dr Cathlean Cower Cardiologist - n/a Neurology - Dr Alvino Blood  CT Chest x-ray - 06/06/21 EKG - 06/06/21 Stress Test - 10/14/19 ECHO - 05/13/21 Cardiac Cath - 10/18/15  ICD Pacemaker/Loop - n/a  Sleep Study -  n/a CPAP - none  Aspirin and Blood Thinner Instructions: Follow your surgeon's instructions on when to stop Aspirin and Eliquis prior to surgery,  If no instructions were given by your surgeon then you will need to call the office for those instructions.  MD's phone number was given to Lincoln Maxin for him to obtain ASA and Eliquis instructions for DOS.   Anesthesia review: Yes  STOP now taking any Aspirin (unless otherwise instructed by your surgeon), Aleve, Naproxen, Ibuprofen, Motrin, Advil, Goody's, BC's, all herbal medications, fish oil, and all vitamins.   Coronavirus Screening Covid test n/a Ambulatory Surgery  Do you have any of the following symptoms:  Cough yes/no: No Fever (>100.53F)  yes/no: No Runny nose yes/no: No Sore throat yes/no: No Difficulty breathing/shortness of breath  yes/no: No  Have you traveled in the last 14 days and where? yes/no: No  Spoke with Son Pieter Partridge and patient via speaker phone.  Son Pieter Partridge verbalized understanding of instructions that were given via phone.

## 2021-06-16 NOTE — Telephone Encounter (Signed)
Thank you for the updates [facility]!

## 2021-06-17 ENCOUNTER — Encounter (HOSPITAL_COMMUNITY): Payer: Self-pay | Admitting: Physician Assistant

## 2021-06-17 ENCOUNTER — Other Ambulatory Visit: Payer: Self-pay

## 2021-06-17 ENCOUNTER — Other Ambulatory Visit: Payer: Self-pay | Admitting: Hematology

## 2021-06-17 DIAGNOSIS — C799 Secondary malignant neoplasm of unspecified site: Secondary | ICD-10-CM

## 2021-06-17 MED ORDER — ENOXAPARIN SODIUM 100 MG/ML IJ SOSY
100.0000 mg | PREFILLED_SYRINGE | Freq: Once | INTRAMUSCULAR | Status: DC
  Filled 2021-06-17: qty 1

## 2021-06-17 NOTE — Progress Notes (Signed)
Anesthesia Chart Review: Same day workup  Recently admitted 12/1-12/9/22 for sepsis secondary to e.coli bacteremia with concern that he may have underlying malignancy. He was treated with rocephin and flagyl with recommended 4 weeks of IV rocephin and oral flagyl. Liver biopsy contemplated, but deffered and felt more appropriate after completion of 4 weeks of abx. Also found to have PE and discharged on eliquis. Also had right renal lesion concerning for malignancy. Echo 05/13/21 showed EF 40-45%, no signfiicant valvular abnormalities.  Admitted 12/26-12/28/22 for question of pneumonia. He had chest pain that essentially resolved in the ED. Findings on CT concerning for possible pneumonia vs. Inflammation. Blood cultures negative. Per discharge summary, suspected musculoskeletal pain. He was also noted to have SVT on arrival, given adenosine with rate control and low dose coreg added.  Following with oncologist Dr. 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  Pt on Eliquis for recent PE, unsure of instructions regarding holding for surgery. I spoke with Dr. Zenia Resides regarding this. She will provide pt with instructions, may need lovenox bridge.  Pt will need DOS labs and eval.  TTE 05/13/21:  1. Left ventricular ejection fraction, by estimation, is 40 to 45%. The  left ventricle has mildly decreased function. The left ventricle  demonstrates global hypokinesis. Left ventricular diastolic parameters are  indeterminate.   2. Right ventricular systolic function is normal. The right ventricular  size is mildly enlarged. Tricuspid regurgitation signal is inadequate for  assessing PA pressure.   3. Right atrial size was mildly dilated.   4. The mitral valve is normal in structure. Trivial mitral valve  regurgitation. No evidence of mitral stenosis.   5. The aortic valve is tricuspid. Aortic valve regurgitation is not   visualized. No aortic stenosis is present.   6. The inferior vena cava is dilated in size with >50% respiratory  variability, suggesting right atrial pressure of 8 mmHg.  Nuclear stress 10/14/19: Nuclear stress EF: 51%. There was no ST segment deviation noted during stress. Defect 1: There is a small defect of moderate severity present in the apex location. This is a low risk study. The left ventricular ejection fraction is mildly decreased (45-54%).   Normal, low risk stress nuclear study with mild apical thinning but no ischemia.  Gated ejection fraction low normal at 51%.  Normal wall motion.   Wynonia Musty [facility] Short Stay Center/Anesthesiology Phone [phone] 06/17/2021 3:24 PM

## 2021-06-17 NOTE — Progress Notes (Unsigned)
n

## 2021-06-17 NOTE — Progress Notes (Signed)
I spoke with Andre [name], Dr Ayesha Rumpf scheduler requesting instructions for Andre [name] and his upcoming port placement scheduled for 1/9 at 69.  He is to be npo after midnight arrive at [facility] cone main entrance at [facility] 06/20/2021.  I left a vm with his daughter [name] and Tammi Sou, RN is providing this information via mychart.

## 2021-06-18 ENCOUNTER — Ambulatory Visit: Payer: Medicare Other

## 2021-06-18 MED ORDER — ENOXAPARIN SODIUM 100 MG/ML IJ SOSY
100.0000 mg | PREFILLED_SYRINGE | Freq: Once | INTRAMUSCULAR | Status: DC
  Filled 2021-06-18: qty 1

## 2021-06-18 NOTE — Progress Notes (Signed)
Left a message for patient at 1300 regarding his appt for 1215 today for injection. LVM that we are here in clinic until 1400 today. He did not come.

## 2021-06-20 ENCOUNTER — Other Ambulatory Visit (HOSPITAL_COMMUNITY): Payer: Self-pay | Admitting: Surgery

## 2021-06-20 ENCOUNTER — Telehealth: Payer: Self-pay | Admitting: Hematology

## 2021-06-20 ENCOUNTER — Ambulatory Visit (HOSPITAL_COMMUNITY): Admission: RE | Admit: 2021-06-20 | Payer: Medicare Other | Source: Home / Self Care | Admitting: Surgery

## 2021-06-20 SURGERY — INSERTION, TUNNELED CENTRAL VENOUS DEVICE, WITH PORT
Anesthesia: General

## 2021-06-20 MED ORDER — HEPARIN 6000 UNIT IRRIGATION SOLUTION
Status: AC
  Filled 2021-06-20: qty 500

## 2021-06-20 MED ORDER — PROPOFOL 10 MG/ML IV BOLUS
INTRAVENOUS | Status: AC
  Filled 2021-06-20: qty 20

## 2021-06-20 MED ORDER — FENTANYL CITRATE (PF) 250 MCG/5ML IJ SOLN
INTRAMUSCULAR | Status: AC
  Filled 2021-06-20: qty 5

## 2021-06-20 MED ORDER — ROCURONIUM BROMIDE 10 MG/ML (PF) SYRINGE
PREFILLED_SYRINGE | INTRAVENOUS | Status: AC
  Filled 2021-06-20: qty 10

## 2021-06-20 MED ORDER — LIDOCAINE 2% (20 MG/ML) 5 ML SYRINGE
INTRAMUSCULAR | Status: AC
  Filled 2021-06-20: qty 5

## 2021-06-20 MED ORDER — SUCCINYLCHOLINE CHLORIDE 200 MG/10ML IV SOSY
PREFILLED_SYRINGE | INTRAVENOUS | Status: AC
  Filled 2021-06-20: qty 10

## 2021-06-20 MED ORDER — HEPARIN SOD (PORK) LOCK FLUSH 100 UNIT/ML IV SOLN
INTRAVENOUS | Status: AC
  Filled 2021-06-20: qty 5

## 2021-06-20 MED ORDER — BUPIVACAINE HCL (PF) 0.25 % IJ SOLN
INTRAMUSCULAR | Status: AC
  Filled 2021-06-20: qty 30

## 2021-06-20 NOTE — Anesthesia Preprocedure Evaluation (Deleted)
Anesthesia Evaluation  Patient identified by MRN, date of birth, ID band Patient awake    Reviewed: Allergy & Precautions, NPO status , Patient's Chart, lab work & pertinent test results  Airway Mallampati: II  TM Distance: >3 FB     Dental   Pulmonary pneumonia, COPD, Patient abstained from smoking., former smoker,    breath sounds clear to auscultation       Cardiovascular hypertension, + CAD   Rhythm:Regular Rate:Normal     Neuro/Psych  Headaches, TIA Neuromuscular disease CVA    GI/Hepatic Neg liver ROS, GERD  ,  Endo/Other  diabetes  Renal/GU      Musculoskeletal   Abdominal   Peds  Hematology   Anesthesia Other Findings   Reproductive/Obstetrics                             Anesthesia Physical Anesthesia Plan  ASA: 3  Anesthesia Plan: General   Post-op Pain Management:    Induction: Intravenous  PONV Risk Score and Plan: 2 and Ondansetron  Airway Management Planned: LMA  Additional Equipment:   Intra-op Plan:   Post-operative Plan:   Informed Consent: I have reviewed the patients History and Physical, chart, labs and discussed the procedure including the risks, benefits and alternatives for the proposed anesthesia with the patient or authorized representative who has indicated his/her understanding and acceptance.     Dental advisory given  Plan Discussed with: CRNA and Anesthesiologist  Anesthesia Plan Comments:         Anesthesia Quick Evaluation

## 2021-06-20 NOTE — Telephone Encounter (Signed)
Left message with follow-up appointments per 1/4 los.

## 2021-06-20 NOTE — Telephone Encounter (Signed)
Pt had no show for port placement this morning. I called him but did not get hold of him. I left him a VM for him to call me back. I also tried both if his daughter but was not able to get hold of them.  Truitt Merle  06/20/2021

## 2021-06-21 NOTE — Progress Notes (Signed)
I called Mr Andre [name] to review transportation I had arranged for his upcoming appts.  His daughter, Andre [name] answered.  She tells me that Mr Andre [name] is not strong enough for chemotherapy.  She and her siblings feel that he is unable to understand chemotherapy treatment.  She requests that his chemo education and chemotherapy infusions be cancelled.  She said he will have the PET scan cone and she is able to bring him for his appts on Monday 06/27/2021.

## 2021-06-22 ENCOUNTER — Other Ambulatory Visit: Payer: Medicare Other

## 2021-06-24 ENCOUNTER — Other Ambulatory Visit (HOSPITAL_COMMUNITY): Payer: Self-pay

## 2021-06-24 ENCOUNTER — Other Ambulatory Visit: Payer: Self-pay

## 2021-06-24 ENCOUNTER — Ambulatory Visit (HOSPITAL_COMMUNITY)
Admission: RE | Admit: 2021-06-24 | Discharge: 2021-06-24 | Disposition: A | Discharge status: Home / Self Care | Payer: Medicare Other | Source: Ambulatory Visit | Attending: Hematology | Admitting: Hematology

## 2021-06-24 ENCOUNTER — Encounter: Payer: Self-pay | Admitting: Hematology

## 2021-06-24 ENCOUNTER — Other Ambulatory Visit: Payer: Self-pay | Admitting: Hematology

## 2021-06-24 DIAGNOSIS — C787 Secondary malignant neoplasm of liver and intrahepatic bile duct: Secondary | ICD-10-CM | POA: Insufficient documentation

## 2021-06-24 DIAGNOSIS — C801 Malignant (primary) neoplasm, unspecified: Secondary | ICD-10-CM | POA: Insufficient documentation

## 2021-06-24 MED FILL — Fosaprepitant Dimeglumine For IV Infusion 150 MG (Base Eq): INTRAVENOUS | Qty: 5 | Status: AC

## 2021-06-24 MED FILL — Dexamethasone Sodium Phosphate Inj 100 MG/10ML: INTRAMUSCULAR | Qty: 1 | Status: AC

## 2021-06-27 ENCOUNTER — Other Ambulatory Visit: Payer: Self-pay

## 2021-06-27 ENCOUNTER — Encounter: Payer: Self-pay | Admitting: Nurse Practitioner

## 2021-06-27 ENCOUNTER — Inpatient Hospital Stay: Payer: Medicare Other

## 2021-06-27 ENCOUNTER — Inpatient Hospital Stay (HOSPITAL_BASED_OUTPATIENT_CLINIC_OR_DEPARTMENT_OTHER): Payer: Medicare Other | Admitting: Nurse Practitioner

## 2021-06-27 ENCOUNTER — Other Ambulatory Visit: Payer: Medicare Other

## 2021-06-27 ENCOUNTER — Inpatient Hospital Stay (HOSPITAL_BASED_OUTPATIENT_CLINIC_OR_DEPARTMENT_OTHER): Payer: Medicare Other | Admitting: Hematology

## 2021-06-27 ENCOUNTER — Other Ambulatory Visit (HOSPITAL_COMMUNITY): Payer: Self-pay

## 2021-06-27 VITALS — BP 104/80 | HR 98 | Resp 17 | Ht 70.0 in | Wt 128.7 lb

## 2021-06-27 DIAGNOSIS — Z66 Do not resuscitate: Secondary | ICD-10-CM

## 2021-06-27 DIAGNOSIS — C20 Malignant neoplasm of rectum: Secondary | ICD-10-CM

## 2021-06-27 DIAGNOSIS — Z79899 Other long term (current) drug therapy: Secondary | ICD-10-CM | POA: Diagnosis not present

## 2021-06-27 DIAGNOSIS — R634 Abnormal weight loss: Secondary | ICD-10-CM

## 2021-06-27 DIAGNOSIS — K5730 Diverticulosis of large intestine without perforation or abscess without bleeding: Secondary | ICD-10-CM | POA: Diagnosis not present

## 2021-06-27 DIAGNOSIS — R531 Weakness: Secondary | ICD-10-CM | POA: Diagnosis not present

## 2021-06-27 DIAGNOSIS — C787 Secondary malignant neoplasm of liver and intrahepatic bile duct: Secondary | ICD-10-CM

## 2021-06-27 DIAGNOSIS — E860 Dehydration: Secondary | ICD-10-CM

## 2021-06-27 DIAGNOSIS — N2889 Other specified disorders of kidney and ureter: Secondary | ICD-10-CM | POA: Diagnosis not present

## 2021-06-27 DIAGNOSIS — Z7189 Other specified counseling: Secondary | ICD-10-CM

## 2021-06-27 DIAGNOSIS — K760 Fatty (change of) liver, not elsewhere classified: Secondary | ICD-10-CM | POA: Diagnosis not present

## 2021-06-27 DIAGNOSIS — C799 Secondary malignant neoplasm of unspecified site: Secondary | ICD-10-CM | POA: Diagnosis not present

## 2021-06-27 DIAGNOSIS — I10 Essential (primary) hypertension: Secondary | ICD-10-CM | POA: Diagnosis not present

## 2021-06-27 DIAGNOSIS — Z515 Encounter for palliative care: Secondary | ICD-10-CM | POA: Diagnosis not present

## 2021-06-27 DIAGNOSIS — J432 Centrilobular emphysema: Secondary | ICD-10-CM | POA: Diagnosis not present

## 2021-06-27 DIAGNOSIS — C778 Secondary and unspecified malignant neoplasm of lymph nodes of multiple regions: Secondary | ICD-10-CM | POA: Diagnosis not present

## 2021-06-27 DIAGNOSIS — Z8673 Personal history of transient ischemic attack (TIA), and cerebral infarction without residual deficits: Secondary | ICD-10-CM | POA: Diagnosis not present

## 2021-06-27 DIAGNOSIS — I700 Atherosclerosis of aorta: Secondary | ICD-10-CM | POA: Diagnosis not present

## 2021-06-27 DIAGNOSIS — C3412 Malignant neoplasm of upper lobe, left bronchus or lung: Secondary | ICD-10-CM | POA: Diagnosis not present

## 2021-06-27 DIAGNOSIS — Z7901 Long term (current) use of anticoagulants: Secondary | ICD-10-CM | POA: Diagnosis not present

## 2021-06-27 DIAGNOSIS — I2510 Atherosclerotic heart disease of native coronary artery without angina pectoris: Secondary | ICD-10-CM | POA: Diagnosis not present

## 2021-06-27 DIAGNOSIS — K8060 Calculus of gallbladder and bile duct with cholecystitis, unspecified, without obstruction: Secondary | ICD-10-CM | POA: Diagnosis not present

## 2021-06-27 DIAGNOSIS — Z85048 Personal history of other malignant neoplasm of rectum, rectosigmoid junction, and anus: Secondary | ICD-10-CM | POA: Diagnosis not present

## 2021-06-27 DIAGNOSIS — K219 Gastro-esophageal reflux disease without esophagitis: Secondary | ICD-10-CM | POA: Diagnosis not present

## 2021-06-27 DIAGNOSIS — Z7982 Long term (current) use of aspirin: Secondary | ICD-10-CM | POA: Diagnosis not present

## 2021-06-27 DIAGNOSIS — C772 Secondary and unspecified malignant neoplasm of intra-abdominal lymph nodes: Secondary | ICD-10-CM | POA: Diagnosis not present

## 2021-06-27 LAB — CBC WITH DIFFERENTIAL (CANCER CENTER ONLY)
Abs Immature Granulocytes: 0.01 10*3/uL (ref 0.00–0.07)
Basophils Absolute: 0 10*3/uL (ref 0.0–0.1)
Basophils Relative: 0 %
Eosinophils Absolute: 0.1 10*3/uL (ref 0.0–0.5)
Eosinophils Relative: 2 %
HCT: 43.3 % (ref 39.0–52.0)
Hemoglobin: 14.3 g/dL (ref 13.0–17.0)
Immature Granulocytes: 0 %
Lymphocytes Relative: 41 %
Lymphs Abs: 1.9 10*3/uL (ref 0.7–4.0)
MCH: 29.7 pg (ref 26.0–34.0)
MCHC: 33 g/dL (ref 30.0–36.0)
MCV: 89.8 fL (ref 80.0–100.0)
Monocytes Absolute: 0.5 10*3/uL (ref 0.1–1.0)
Monocytes Relative: 10 %
Neutro Abs: 2.1 10*3/uL (ref 1.7–7.7)
Neutrophils Relative %: 47 %
Platelet Count: 215 10*3/uL (ref 150–400)
RBC: 4.82 MIL/uL (ref 4.22–5.81)
RDW: 15.6 % — ABNORMAL HIGH (ref 11.5–15.5)
WBC Count: 4.6 10*3/uL (ref 4.0–10.5)
nRBC: 0 % (ref 0.0–0.2)

## 2021-06-27 LAB — CMP (CANCER CENTER ONLY)
ALT: 28 U/L (ref 0–44)
AST: 34 U/L (ref 15–41)
Albumin: 3.8 g/dL (ref 3.5–5.0)
Alkaline Phosphatase: 324 U/L — ABNORMAL HIGH (ref 38–126)
Anion gap: 12 (ref 5–15)
BUN: 12 mg/dL (ref 8–23)
CO2: 27 mmol/L (ref 22–32)
Calcium: 10.3 mg/dL (ref 8.9–10.3)
Chloride: 96 mmol/L — ABNORMAL LOW (ref 98–111)
Creatinine: 0.78 mg/dL (ref 0.61–1.24)
GFR, Estimated: >60 mL/min (ref >60)
Glucose, Bld: 96 mg/dL (ref 70–99)
Potassium: 3.4 mmol/L — ABNORMAL LOW (ref 3.5–5.1)
Sodium: 135 mmol/L (ref 135–145)
Total Bilirubin: 1.7 mg/dL — ABNORMAL HIGH (ref 0.3–1.2)
Total Protein: 8.2 g/dL — ABNORMAL HIGH (ref 6.5–8.1)

## 2021-06-27 LAB — MAGNESIUM: Magnesium: 1.7 mg/dL (ref 1.7–2.4)

## 2021-06-27 MED ORDER — OXYCODONE HCL 5 MG PO TABS: 5.0000 mg | ORAL_TABLET | ORAL | 0 refills | Status: AC | PRN

## 2021-06-27 MED ORDER — MIRTAZAPINE 15 MG PO TABS
15.0000 mg | ORAL_TABLET | Freq: Every day | ORAL | 2 refills | Status: AC
Start: 2021-06-27 — End: ?

## 2021-06-27 MED ORDER — SODIUM CHLORIDE 0.9 % IV SOLN: INTRAVENOUS | Status: DC

## 2021-06-27 MED ORDER — APIXABAN 5 MG PO TABS: 5.0000 mg | ORAL_TABLET | Freq: Two times a day (BID) | ORAL | 3 refills | Status: AC

## 2021-06-27 MED ORDER — SODIUM CHLORIDE 0.9 % IV SOLN: INTRAVENOUS | Status: AC

## 2021-06-27 NOTE — Patient Instructions (Signed)
Rehydration, Elderly Rehydration is the replacement of body fluids, salts, and minerals (electrolytes) that are lost during dehydration. Dehydration is when there is not enough water or other fluids in the body. This happens when you lose more fluids than you take in. People who are age 80 or older have a higher risk of dehydration than younger adults. Common causes of dehydration include: Conditions that cause loss of water or other fluids, such as diarrhea, vomiting, sweating, or urinating a lot. Not drinking enough fluids. This can occur when you are ill or doing activities that require a lot of energy, especially in hot weather. Other illnesses and conditions, such as fever or infection. Certain medicines, such as those that remove excess fluid from the body (diuretics). Not being able to get enough water and food. Symptoms of mild or moderate dehydration may include thirst, dry lips and mouth, and dizziness. Symptoms of severe dehydration may include increased heart rate, confusion, fainting, and not urinating. For severe dehydration, you may need to get fluids through an IV at the hospital. For mild or moderate dehydration, you can usually rehydrate at home by drinking certain fluids as told by your health care provider. What are the risks? Generally, rehydration is safe. However, taking in too much fluid (overhydration) can be a problem. This is rare. Overhydration can cause an electrolyte imbalance, kidney failure, fluid in the lungs, or a decrease in salt (sodium) levels in the body. Supplies needed: You will need an oral rehydration solution (ORS) if your health care provider tells you to use one. This is a drink designed to treat dehydration. It can be found in pharmacies and retail stores. How to rehydrate Fluids Follow instructions from your health care provider for rehydration. The kind of fluid and the amount you should drink depend on your condition. In general, for mild dehydration,  you should choose drinks that you prefer. If told by your health care provider, drink an ORS. Make an ORS by following instructions on the package. Start by drinking small amounts, about  cup (120 mL) every 5-10 minutes. Slowly increase how much you drink until you have taken the amount recommended by your health care provider. Drink enough fluids to keep your urine pale yellow. If you were told to drink an ORS, finish the ORS first, then start slowly drinking other clear fluids. Drink fluids such as: Water. This includes sparkling water and flavored water. Drinking only waterwhile rehydrating can lead to having too little sodium in your body (hyponatremia). Follow instructions from your health care provider. Water from ice chips you suck on. Fruit juice with water you add to it(diluted). Sports drinks. Hot or cold herbal teas. Broth-based soups. Coffee. Milk or milk products. Food Follow instructions from your health care provider about what to eat while you rehydrate. Your health care provider may recommend that you slowly begin eating regular foods in small amounts. Eat foods that contain a healthy balance of electrolytes, such as bananas, oranges, potatoes, tomatoes, and spinach. Avoid foods that are greasy or contain a lot of sugar. In some cases, you may get nutrition through a feeding tube that is passed through your nose and into your stomach (nasogastric tube, or NG tube). This may be done if you have uncontrolled vomiting or diarrhea. Beverages to avoid Certain beverages may make dehydration worse. While you rehydrate, avoid drinking alcohol. How to tell if you are recovering from dehydration You may be recovering from dehydration if: You are urinating more often than before you  started rehydrating. Your urine is pale yellow. Your energy level improves. You vomit less frequently. You have diarrhea less frequently. Your appetite improves or returns to normal. You feel less  dizzy or less light-headed. Your skin tone and color start to look more normal. Follow these instructions at home: Take over-the-counter and prescription medicines only as told by your health care provider. Do not take sodium tablets. Doing this can lead to having too much sodium in your body (hypernatremia). Contact a health care provider if: You continue to have symptoms of mild or moderate dehydration, such as: Thirst. Dry lips. Slightly dry mouth. Dizziness. Dark urine or less urine than usual. Muscle cramps. You continue to vomit or have diarrhea. Get help right away if you: Have symptoms of dehydration that get worse. Have a fever. Have a severe headache. Have been vomiting and the following happens: Your vomiting gets worse. Your vomit includes blood or green matter (bile). You cannot eat or drink without vomiting. Have problems with urination or bowel movements, such as: Diarrhea that gets worse. Blood in your stool (feces). This may cause stool to look black and tarry. Not urinating, or urinating only a small amount of very dark urine, within 6-8 hours. Have trouble breathing. Have symptoms that get worse with treatment. These symptoms may represent a serious problem that is an emergency. Do not wait to see if the symptoms will go away. Get medical help right away. Call your local emergency services (911 in the [place] Do not drive yourself to the hospital. Summary Rehydration is the replacement of body fluids, salts, and minerals (electrolytes) that are lost during dehydration. Follow instructions from your health care provider for rehydration. The kind of fluid and the amount you should drink depend on your condition. Slowly increase how much you drink until you have taken the amount recommended by your health care provider. Contact your health care provider if you continue to show signs of mild or moderate dehydration. This information is not intended to replace advice  given to you by your health care provider. Make sure you discuss any questions you have with your health care provider. Document Revised: 07/30/2019 Document Reviewed: 07/17/2019 Elsevier Patient Education  [place].

## 2021-06-27 NOTE — Progress Notes (Signed)
Normal Saline 1L over 2hrs

## 2021-06-27 NOTE — Progress Notes (Signed)
Andre [name]  Telephone:([phone]) [phone] Fax:(336) [phone]   Name: Andre [name] Date: 06/27/2021 MRN: [id number]  DOB: [birth year]  Patient Care Team: Biagio Borg, MD as PCP - General (Internal Medicine) [facility] as Consulting Physician (Ophthalmology) Marcial Pacas, MD as Consulting Physician (Neurology) [place], Darnelle Maffucci, [facility] as Pharmacist (Pharmacist) Truitt Merle, MD as Consulting Physician (Oncology) Royston Bake, RN as Oncology Nurse Navigator (Oncology)    REASON FOR CONSULTATION: Andre [name] is a 81 y.o. 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  Palliative ask to see for symptom management and goals of care.    SOCIAL HISTORY:     reports that he quit smoking about 8 weeks ago. His smoking use included cigarettes. He has a 25.00 pack-year smoking history. He has never used smokeless tobacco. He reports current alcohol use of about 7.0 standard drinks per week. He reports that he does not use drugs.  ADVANCE DIRECTIVES:  Patient does not have a documented advanced directive. Discussed at length and paperwork provided. He would like to come back to AD clinic and complete documents on 1/27. MOST form completed. States his daughter Andre [name] is he requested medical decision maker with back-up support from his daughter Andre [name] if Andre [name] is not available.   CODE STATUS: DNR  PAST MEDICAL HISTORY: Past Medical History:  Diagnosis Date   Arthritis    Coronary artery disease, non-occlusive    a. 06/2011: cath for abnormal exercise echo: Only 30-40% proximal RCA. Otherwise normal. b. 10/2015: cath for abnormal NST and atypical CP --> showed mild 30-40% stenosis in the prox-distal RCA   GERD (gastroesophageal reflux disease)    HA (headache)    Hypertension    Memory loss    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[place])    TIA  (transient ischemic attack)    Weakness     PAST SURGICAL HISTORY:  Past Surgical History:  Procedure Laterality Date   CARDIAC CATHETERIZATION  January 2013   Proximal RCA 30-40%. Otherwise normal.   CARDIAC CATHETERIZATION N/A 10/18/2015   Procedure: Left Heart Cath and Coronary Angiography;  Surgeon: Jettie Booze, MD;  Location: [facility];  Service: Cardiovascular;  Laterality: N/A;   ESOPHAGOGASTRODUODENOSCOPY (EGD) WITH PROPOFOL N/A 05/18/2021   Procedure: ESOPHAGOGASTRODUODENOSCOPY (EGD) WITH PROPOFOL;  Surgeon: Carol Ada, MD;  Location: [facility];  Service: Endoscopy;  Laterality: N/A;   EUS Left 05/18/2021   Procedure: UPPER ENDOSCOPIC ULTRASOUND (EUS) LINEAR;  Surgeon: Carol Ada, MD;  Location: [facility];  Service: Endoscopy;  Laterality: Left;   FINE NEEDLE ASPIRATION  05/18/2021   Procedure: FINE NEEDLE ASPIRATION (FNA) LINEAR;  Surgeon: Carol Ada, MD;  Location: [facility];  Service: Endoscopy;;   NM MYOVIEW LTD  March 2015   LOW RISK. No scar or ischemia. Normal EF (55%). No RWMA   surgical excision of rectal cancer      HEMATOLOGY/ONCOLOGY HISTORY:  Oncology History  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[place])  10/23/2011 Initial Diagnosis   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[place])   Metastatic adenocarcinoma to abdominal lymph nodes and possible lung, likely gallbladder primary  04/03/2021 Imaging   EXAM: CT ABDOMEN AND PELVIS WITH CONTRAST  IMPRESSION: 1. Enhancing right renal mass measuring 3 x 2.6 cm, highly suspicious for renal cell carcinoma. Recommend urology consultation. 2. Abnormal appearance of the gallbladder which is only partially distended. There is intraluminal density as well as suggestion of diffuse wall thickening.  The adjacent liver parenchyma demonstrates decreased density which may be edema. In addition, there is intra and extrahepatic biliary ductal dilatation. No visualized choledocholithiasis. Recommend correlation with LFTs, as well as further assessment  with MRCP. MRCP should only be considered when patient is able to tolerate breath hold technique 3. Upper abdominal adenopathy, some of which appears low-density/necrotic, measuring up to 16 mm. These are nonspecific, given renal and gallbladder findings, metastatic disease is not entirely excluded. 4. Presumed contrast mixing in the renal veins and IVC. 5. Colonic diverticulosis without diverticulitis. 6. Aortic and branch atherosclerosis. 7. Mildly enlarged prostate gland.   04/04/2021 Imaging   EXAM: ULTRASOUND ABDOMEN LIMITED RIGHT UPPER QUADRANT  IMPRESSION: 1. Cholelithiasis with findings suggestive of acute cholecystitis. A hepatobiliary scintigraphy may provide better evaluation of the gallbladder if there is a high clinical concern for acute cholecystitis . 2. Fatty liver. 3. Right renal mass.   05/12/2021 Imaging   EXAM: CT ANGIOGRAPHY CHEST WITH CONTRAST  IMPRESSION: 1. Small amount of pulmonary embolism within a subsegmental lower lobe branch of the left pulmonary artery. 2. 1.1 cm noncalcified lung nodule within the posterior aspect of the left upper lobe. This represents a new finding when compared to the prior exam. Consider one of the following in 3 months for both low-risk and high-risk individuals: (a) repeat chest CT, (b) follow-up PET-CT, or (c) tissue sampling. This recommendation follows the consensus statement: Guidelines for Management of Incidental Pulmonary Nodules Detected on CT Images: From the Fleischner Society 2017; Radiology 2017; [phone].   05/12/2021 Imaging   EXAM: CT ABDOMEN AND PELVIS WITH CONTRAST  IMPRESSION: Increase in the inflammatory changes surrounding the gallbladder and extending into the adjacent liver again suspicious for acute cholecystitis with localized hepatic inflammatory change. Correlate with pending ultrasound exam   Upper abdominal lymphadenopathy which appears necrotic and stable in appearance from the prior exam.  Patient is scheduled for PET-CT according to outpatient clinic notes.   Stable enhancing right renal mass suspicious for renal cell carcinoma. By history the patient has met with urology.   Remainder of the exam is stable from the prior study.   05/12/2021 Imaging   EXAM: ULTRASOUND ABDOMEN LIMITED RIGHT UPPER QUADRANT  IMPRESSION: Stable changes in the gallbladder with cholelithiasis and gallbladder wall thickening. Negative sonographic Murphy's sign is elicited however. This may be in part due to underlying medications.   05/13/2021 Imaging   EXAM: MRI ABDOMEN WITHOUT AND WITH CONTRAST (INCLUDING MRCP)  IMPRESSION: 1. Motion degraded images. 2. Cholelithiasis. Suggestion of wall thickening in the setting of underdistention. Development of a pericholecystic liver lesion which is suspicious for extension of chronic cholecystitis. Direct extension of gallbladder carcinoma felt less likely, given absence of dominant gallbladder mass. 3. Upper abdominal necrotic adenopathy, present back to 04/03/2021. Outpatient PET may be informative to direct sampling. 4. Mild intra and extrahepatic biliary duct dilatation. Equivocal findings for distal common duct stone, given above limitations. If bilirubin is elevated, consider ERCP. 5. Interpolar right renal enhancing mass, consistent with renal cell carcinoma. 6. Mild edema within the anterior pararenal space. Correlate with pancreatic enzyme levels to exclude mild pancreatitis. 7. Right base airspace disease, new since yesterday's CT. Favor atelectasis.   05/18/2021 Procedure   Upper EUS, Dr. Benson Norway  Findings: Multiple malignant-appearing lymph nodes were visualized in the peripancreatic region. These were fifteen mm from the primary tumor. The largest measured 10 mm by 20 mm in maximal cross-sectional diameter. The nodes were oval, heterogenous and had well defined margins. Fine needle  aspiration for cytology was performed. Color Doppler  imaging was utilized prior to needle puncture to confirm a lack of significant vascular structures within the needle path. Five passes were made with the 25 gauge needle using a transgastric approach. A stylet was used. A cytotechnologist was present to evaluate the adequacy of the specimen. Final cytology results are pending.   Multiple peripancreatic lymph nodes were identified. The largest one viewed measured approximatly 10 mm x 20 mm. Multiple passes with the 25 gauge FNA needle were performed through these lymph nodes and an adequate sampling was obtained. In the hilar region of the liver a 20 - 30 mm node/mass/abnormality was identified. It was hyperechoic. The gallbladder was not visualized after multiple attempts with repositioning and tracing the CBD proximally. There was an area near this lesion that was anechoic, potentially being a portion of the gallbladder. This area was adjacent or in the path of a potential FNA. With this uncertainty about the location of the gallbladder in relation to his lesion, an FNA was not performed.  Impression: - Multiple malignant-appearing lymph nodes were visualized in the peripancreatic region. Fine needle aspiration performed.   05/18/2021 Pathology Results   A. 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 Normal contour and caliber of the thoracic and abdominal aorta without evidence of aneurysm, dissection, or other acute aortic pathology. Moderate to severe mixed calcific  atherosclerosis. 2. Scattered ground-glass and heterogeneous airspace opacities in the dependent lungs, nonspecific and most likely infectious or inflammatory. Given concern for new pulmonary nodule raised on prior CT angiogram, recommend follow-up CT in 3 months to ensure resolution. 3. Mild centrilobular emphysema. 4. Redemonstrated contracted, heterogeneous gallbladder with an adjacent hypodense lesion of the right lobe of the liver, poorly assessed on this early arterial phase CT and better characterized by prior MR. 5. Interpolar mass of the midportion of the right kidney again noted, in keeping with renal cell carcinoma again better characterized by prior MR. 6. Colon is fluid-filled to the rectum, suggestive of diarrheal illness. 7. Coronary artery disease.   Aortic Atherosclerosis (ICD10-I70.0) and Emphysema (ICD10-J43.9).   06/27/2021 -  Chemotherapy   Patient is on Treatment Plan : BILIARY TRACT Cisplatin + Gemcitabine D1,8 q21d     06/27/2021 -  Chemotherapy   Patient is on Treatment Plan : BLADDER Durvalumab q28d       ALLERGIES:  has No Known Allergies.  MEDICATIONS:  Current Outpatient Medications  Medication Sig Dispense Refill   acetaminophen (TYLENOL) 325 MG tablet Take 2 tablets (650 mg total) by mouth every 6 (six) hours as needed for mild pain (or Fever >/= 101).     apixaban (ELIQUIS) 5 MG TABS tablet Take 1 tablet (5 mg total) by mouth 2 (two) times daily. 60 tablet 3   aspirin EC 81 MG tablet Take 81 mg by mouth daily. Swallow whole.     carvedilol (COREG) 3.125 MG tablet Take 1 tablet (3.125 mg total) by mouth 2 (two) times daily with a meal. 60 tablet 0   diclofenac Sodium (VOLTAREN) 1 % GEL Apply 2 g topically 4 (four)  times daily.     lidocaine-prilocaine (EMLA) cream Apply to affected area once 30 g 3   mirtazapine (REMERON) 15 MG tablet Take 1 tablet (15 mg total) by mouth at bedtime. 30 tablet 2   ondansetron (ZOFRAN) 8 MG tablet Take 1 tablet (8 mg  total) by mouth 2 (two) times daily as needed. Start on the third day after cisplatin chemotherapy. 30 tablet 1   oxyCODONE (OXY IR/ROXICODONE) 5 MG immediate release tablet Take 1 tablet (5 mg total) by mouth every 4 (four) hours as needed for severe pain. 30 tablet 0   pantoprazole (PROTONIX) 40 MG tablet Take 1 tablet (40 mg total) by mouth daily. 30 tablet 1   prochlorperazine (COMPAZINE) 10 MG tablet Take 1 tablet (10 mg total) by mouth every 6 (six) hours as needed (Nausea or vomiting). 30 tablet 1   traMADol (ULTRAM) 50 MG tablet TAKE 1 TABLET(50 MG) BY MOUTH EVERY 6 HOURS AS NEEDED 30 tablet 0   No current facility-administered medications for this visit.   Facility-Administered Medications Ordered in Other Visits  Medication Dose Route Frequency Provider Last Rate Last Admin   0.9 %  sodium chloride infusion   Intravenous Continuous Truitt Merle, MD       0.9 %  sodium chloride infusion   Intravenous Continuous Truitt Merle, MD 500 mL/hr at 06/27/21 0953 New Bag at 06/27/21 0953   enoxaparin (LOVENOX) injection 100 mg  100 mg Subcutaneous Once Truitt Merle, MD        VITAL SIGNS: There were no vitals taken for this visit. There were no vitals filed for this visit.  Estimated body mass index is 18.47 kg/m as calculated from the following:   Height as of an earlier encounter on 06/27/21: _0  (1.778 m).   Weight as of an earlier encounter on 06/27/21: 128 lb 11.2 oz (58.4 kg).  LABS: CBC:    Component Value Date/Time   WBC 4.6 06/27/2021 0851   WBC 5.4 06/08/2021 0331   HGB 14.3 06/27/2021 0851   HCT 43.3 06/27/2021 0851   PLT 215 06/27/2021 0851   MCV 89.8 06/27/2021 0851   MCV 93.9 11/03/2015 0931   NEUTROABS 2.1 06/27/2021 0851   LYMPHSABS 1.9 06/27/2021 0851   MONOABS 0.5 06/27/2021 0851   EOSABS 0.1 06/27/2021 0851   BASOSABS 0.0 06/27/2021 0851   Comprehensive Metabolic Panel:    Component Value Date/Time   NA 135 06/27/2021 0851   K 3.4 (L) 06/27/2021 0851   CL 96  (L) 06/27/2021 0851   CO2 27 06/27/2021 0851   BUN 12 06/27/2021 0851   CREATININE 0.78 06/27/2021 0851   CREATININE 0.81 11/03/2015 0918   GLUCOSE 96 06/27/2021 0851   CALCIUM 10.3 06/27/2021 0851   AST 34 06/27/2021 0851   ALT PENDING 06/27/2021 0851   ALKPHOS 324 (H) 06/27/2021 0851   BILITOT 1.7 (H) 06/27/2021 0851   PROT 8.2 (H) 06/27/2021 0851   PROT 7.5 12/17/2019 0831   ALBUMIN 3.8 06/27/2021 0851    RADIOGRAPHIC STUDIES: CT Angio Chest/Abd/Pel for Dissection W and/or Wo Contrast  Result Date: 06/06/2021 CLINICAL DATA:  Chest and back pain, shortness of breath, aortic dissection suspected EXAM: CT ANGIOGRAPHY CHEST, ABDOMEN AND PELVIS TECHNIQUE: Non-contrast CT of the chest was initially obtained. Multidetector CT imaging through the chest, abdomen and pelvis was performed using the standard protocol during bolus administration of intravenous contrast. Multiplanar reconstructed images and MIPs were obtained and reviewed to evaluate the vascular anatomy. CONTRAST:  65m  OMNIPAQUE IOHEXOL 350 MG/ML SOLN COMPARISON:  CT chest angiogram, CT abdomen pelvis, 05/12/2021, MR abdomen, 05/13/2021 FINDINGS: CTA CHEST FINDINGS Cardiovascular: Preferential opacification of the thoracic aorta. Normal contour and caliber of the thoracic aorta without evidence of aneurysm, dissection, or other acute aortic pathology. Moderate mixed calcific atherosclerosis of the arch and descending thoracic aorta. Cardiomegaly. Three-vessel coronary artery calcifications. No pericardial effusion. Right upper extremity PICC. Mediastinum/Nodes: No enlarged mediastinal, hilar, or axillary lymph nodes. Thyroid gland, trachea, and esophagus demonstrate no significant findings. Lungs/Pleura: Mild centrilobular emphysema. Scattered ground-glass and heterogeneous airspace opacities in the dependent lungs (series 6, image 36). No pleural effusion or pneumothorax. Musculoskeletal: No chest wall abnormality. No acute or  significant osseous findings. Review of the MIP images confirms the above findings. CTA ABDOMEN AND PELVIS FINDINGS VASCULAR Normal contour and caliber of the abdominal aorta. No evidence of aneurysm, dissection, or other acute aortic pathology. Moderate to severe mixed calcific atherosclerosis. Duplicated right renal arteries, with a small accessory inferior pole right renal artery, and a solitary left renal artery origin with early branching of renal arterioles. Otherwise standard branching pattern of the abdominal aorta. Atherosclerosis at the branch vessel origins without significant stenosis. Review of the MIP images confirms the above findings. NON-VASCULAR Hepatobiliary: Redemonstrated contracted, heterogeneous gallbladder with an adjacent hypodense lesion of the right lobe of the liver (series 7, image 169). Pancreas: Unremarkable. No pancreatic ductal dilatation or surrounding inflammatory changes. Spleen: Normal in size without significant abnormality. Adrenals/Urinary Tract: Adrenal glands are unremarkable. Interpolar mass of the midportion of the right kidney again noted, better appreciated by prior MR (series 7, image 185). Bladder is unremarkable. Stomach/Bowel: Stomach is within normal limits. Appendix appears normal. No evidence of bowel wall thickening, distention, or inflammatory changes. Colon is fluid-filled to the rectum. Descending and sigmoid diverticulosis. Lymphatic: No enlarged abdominal or pelvic lymph nodes. Reproductive: No mass or other significant abnormality. Other: No abdominal wall hernia or abnormality. No abdominopelvic ascites. Musculoskeletal: No acute or significant osseous findings. Review of the MIP images confirms the above findings. IMPRESSION: 1. Normal contour and caliber of the thoracic and abdominal aorta without evidence of aneurysm, dissection, or other acute aortic pathology. Moderate to severe mixed calcific atherosclerosis. 2. Scattered ground-glass and  heterogeneous airspace opacities in the dependent lungs, nonspecific and most likely infectious or inflammatory. Given concern for new pulmonary nodule raised on prior CT angiogram, recommend follow-up CT in 3 months to ensure resolution. 3. Mild centrilobular emphysema. 4. Redemonstrated contracted, heterogeneous gallbladder with an adjacent hypodense lesion of the right lobe of the liver, poorly assessed on this early arterial phase CT and better characterized by prior MR. 5. Interpolar mass of the midportion of the right kidney again noted, in keeping with renal cell carcinoma again better characterized by prior MR. 6. Colon is fluid-filled to the rectum, suggestive of diarrheal illness. 7. Coronary artery disease. Aortic Atherosclerosis (ICD10-I70.0) and Emphysema (ICD10-J43.9). Electronically Signed   By: Delanna Ahmadi M.D.   On: 06/06/2021 14:39    PERFORMANCE STATUS (ECOG) : 2 - Symptomatic, <50% confined to bed  Review of Systems  Constitutional:  Positive for appetite change.  Neurological:  Positive for weakness.  Unless otherwise noted, a complete review of systems is negative.  Physical Exam General: NAD Cardiovascular: regular rate and rhythm Pulmonary: clear ant fields Abdomen: soft, nontender, + bowel sounds Extremities: no edema, no joint deformities Neurological: Weakness, AAOx3  IMPRESSION:  This is my initial visit with Mr. Malveaux and his daughter Andre [name].  I  introduced myself, Advertising copywriter, and Palliative's role in collaboration with the oncology team. Concept of Palliative Care was introduced as specialized medical care for people and their families living with serious illness.  It focuses on providing relief from the symptoms and stress of a serious illness.  The goal is to improve quality of life for both the patient and the family. Values and goals of care important to patient and family were attempted to be elicited.   Mr. Mcwhirter shares he lives in the home with a roommate.   They have lived together for more than 20+ years however he is also elderly.  He is a retired Buyer, retail.  Divorced with 11 children.  Christian faith.  Patient is able to perform some ADLs independently but does require some assistance.  Daughter shares family comes in daily to assist with meals, ADLs, and medications.  Latoya reports either she or her Sister Andre [name] generally takes patient to his appointments.  We discussed Her current illness and what it means in the larger context of Her on-going co-morbidities. Natural disease trajectory and expectations were discussed.  Mr. Harrington Challenger openly speaks to his ongoing decline and poor quality of life.  States he is weak and appetite is poor.  Does feel like he has some improvement with assistance of medication.  He knows at some point he would not be able to provide the care that he needs for himself.  Also acknowledges current treatment is with palliative intent.  Mr. Clausing and his daughter confirms wishes for DNR/DNI.  He acknowledges he does not have a documented advanced directive however his daughter Andre [name] would be his primary Education officer, community.  If she is unavailable his daughter Andre [name] would then be back of support.  Education provided with encouragement and completing advanced directive.  Patient and daughter aware in the setting of no document all children would be responsible for making decisions collectively.  He is clear that he would not want things again emphasizing his daughter Andre [name] would be his decision maker.  Advanced directive packet provided along with education.  They have requested to make an appointment for the advanced directive clinic to complete such documents.  Acknowledged requests an appointment has been made.  I approach discussions regarding MOST form completion.  Education provided.  Edem is requesting to allow his daughter, Andre [name] to complete document on his behalf. Document completed. Original completed  and given to daughter. The patient and family outlined their wishes for the following treatment decisions:  Cardiopulmonary Resuscitation: Do Not Attempt Resuscitation (DNR/No CPR)  Medical Interventions: Comfort Measures: Keep clean, warm, and dry. Use medication by any route, positioning, wound care, and other measures to relieve pain and suffering. Use oxygen, suction and manual treatment of airway obstruction as needed for comfort. Do not transfer to the hospital unless comfort needs cannot be met in current location.  Antibiotics: Determine use of limitation of antibiotics when infection occurs  IV Fluids: No IV fluids (provide other measures to ensure comfort)  Feeding Tube: No feeding tube    We discussed at length outpatient hospice support both in the home and residential facility. Mr. Manganaro would really like to be at home but knows this may not be feasible. He is not ready to make any final decisions. He is requesting to have some time to think about things and continue ongoing family discussions. LaToya agrees and would like him to be comfortable with any decisions. They would like to follow-up on Friday by phone.  I discussed the importance of continued conversation with family and their medical providers regarding overall plan of care and treatment options, ensuring decisions are within the context of the patients values and GOCs.  PLAN: MOST form completed today (see above) Patient and daughter considering hospice support. Would like some time to think about it and have ongoing discussions. Request follow-up on Friday.  I will plan to see patient back in 2-3 weeks in collaboration to other oncology appointments.    Patient expressed understanding and was in agreement with this plan. He also understands that He can call the clinic at any time with any questions, concerns, or complaints.   Time Total: 45 min.   Visit consisted of counseling and education dealing with the complex and  emotionally intense issues of symptom management and palliative care in the setting of serious and potentially life-threatening illness.Greater than 50%  of this time was spent counseling and coordinating care related to the above assessment and plan.  Signed by: Alda Lea, AGPCNP-BC Palliative Medicine Team

## 2021-06-27 NOTE — Progress Notes (Signed)
[place]   Telephone:([phone]) [phone] Fax:(336) [phone]   Clinic Follow up Note   Patient Care Team: Biagio Borg, MD as PCP - General (Internal Medicine) Hutchinson, P.A. as Consulting Physician (Ophthalmology) Marcial Pacas, MD as Consulting Physician (Neurology) Szabat, Darnelle Maffucci, [facility] as Pharmacist (Pharmacist) Truitt Merle, MD as Consulting Physician (Oncology) Royston Bake, RN as Oncology Nurse Navigator (Oncology)  Date of Service:  06/27/2021  CHIEF COMPLAINT: f/u of metastatic adenocarcinoma  CURRENT THERAPY:  Supportive Care  ASSESSMENT & PLAN:  Andre [name] is a 81 y.o. male with   1.  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 CT AP 04/03/21 showed 3 cm right renal mass, abnormal gallbladder with stones, and upper abdominal adenopathy. CT chest showed a 1.1cm lung nodule in LUL -he developed sepsis and was admitted 05/12/21. Abdomen MRI showed: cholelithiasis, pericholecystic liver lesion, upper abdominal necrotic adenopathy, mild biliary duct dilatation, right renal mass, mild edema in pararenal space. -EUS biopsy on 05/18/21 showed metastatic adenocarcinoma to para-pancreatic lymph node. Will discuss with path to see if adequate sample for studies   -they met with Dr. Zenia Resides on 05/31/21, who discussed that he is not eligible for surgery given the degree of lymphadenopathy and extension. -His case was discussed in tumor conference on 12/21. 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  -he was scheduled for PET scan but was admitted for pneumonia 12/26-12/28/22. He did undergo CT CAP as inpatient showing: pulmonary nodules most likely infectious or inflammatory; gallbladder, liver, and renal masses better seen on MRI. -we reviewed the work up and treatment options again today. LaToya and her family  feel he is not strong enough to tolerate chemotherapy. He does not do much during the day and has continued to lose weight. We reviewed that the chemo will not cure his cancer but will treat it. I expressed concern with his living situation; he lives at home with a roommate, as [name] works full time and lives in another city. She notes we can reach out to her sister [name], who lives closer and works at [employer], but [name] reports his other children are not as reliable (example, he was late to his PET scan on 06/24/21) -LaToya is comfortable with hospice/palliative care, but when I asked [name], he states he would like to think about it before making final decision. LaToya later informed me that she has talked to majority of her 9 siblings and they all agree no chemo.  -his PET has been rescheduled for 07/07/21, but I feel this is not necessary if he is not going to receive treatment. -we will proceed with IVF today. -I made an urgent referral to palliative care NP [facility] today and she will see him and f/u before he enroll to hospice   2. Goal of care discussion, DNR -We again discussed the incurable nature of his cancer, and the overall poor prognosis, especially if he does not have good response to chemotherapy or progress on chemo -The patient understands the goal of care is palliative.  -We discussed hospice and palliative care today. I recommend this given his living situation and limited performance status. Daughter agrees with this, but he would prefer to consider his options. They know they can always reach out to [place] with a decision. -I recommend DNR/DNI, he and LaToya agree with this today (  06/27/21)   3. Symptom Management: Abdominal pain, Weight loss, Constipation -I prescribed mirtazapine for his appetite and sleep on 05/26/21. He feels it is helping a little. He states he is eating on and off but is drinking plenty of water. He continues to lose weight. -he reports abdominal pain/discomfort  not relieved by tylenol. I will call in hydrocodone for him today. -he reports constipation today. I recommend he take or increase miralax.   4. Right renal mass  -probably renal cell carcinoma based on images -seen by urologist Dr. Abner Greenspan -will decide management after the other metastatic cancer work up    5. H/o colorectal cancer 2009 -s/p resection 08/23/07 showing no residual neoplasm.   6. HTN, GERD -continue medication      PLAN:  -Pt and daughter decided no chemo -proceed with IVF today -I called in oxycodone for him and refilled his Eliquis and mirtazapine  -cancel PET scan on 07/07/21.  -I recommend home hospice, he would like to think about it  -He will meet palliative care NP Lexine Baton today and f/u her before he enroll to hospice    No problem-specific Assessment & Plan notes found for this encounter.   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 Enhancing right renal mass measuring 3 x 2.6 cm, highly suspicious for renal cell carcinoma. Recommend urology consultation. 2. Abnormal appearance of the gallbladder which is only partially distended. There is intraluminal density as well as suggestion of diffuse wall thickening. The adjacent liver parenchyma demonstrates decreased density which may be edema. In addition, there is intra and extrahepatic biliary ductal dilatation. No visualized choledocholithiasis. Recommend correlation with LFTs, as well as further assessment with MRCP. MRCP should only be considered when patient is able to tolerate breath hold technique 3. Upper abdominal adenopathy, some of which appears low-density/necrotic, measuring up to 16 mm. These are nonspecific, given renal and gallbladder findings,  metastatic disease is not entirely excluded. 4. Presumed contrast mixing in the renal veins and IVC. 5. Colonic diverticulosis without diverticulitis. 6. Aortic and branch atherosclerosis. 7. Mildly enlarged prostate gland.   04/04/2021 Imaging   EXAM: ULTRASOUND ABDOMEN LIMITED RIGHT UPPER QUADRANT  IMPRESSION: 1. Cholelithiasis with findings suggestive of acute cholecystitis. A hepatobiliary scintigraphy may provide better evaluation of the gallbladder if there is a high clinical concern for acute cholecystitis . 2. Fatty liver. 3. Right renal mass.   05/12/2021 Imaging   EXAM: CT ANGIOGRAPHY CHEST WITH CONTRAST  IMPRESSION: 1. Small amount of pulmonary embolism within a subsegmental lower lobe branch of the left pulmonary artery. 2. 1.1 cm noncalcified lung nodule within the posterior aspect of the left upper lobe. This represents a new finding when compared to the prior exam. Consider one of the following in 3 months for both low-risk and high-risk individuals: (a) repeat chest CT, (b) follow-up PET-CT, or (c) tissue sampling. This recommendation follows the consensus statement: Guidelines for Management of Incidental Pulmonary Nodules Detected on CT Images: From the Fleischner Society 2017; Radiology 2017; [phone].   05/12/2021 Imaging   EXAM: CT ABDOMEN AND PELVIS WITH CONTRAST  IMPRESSION: Increase in the inflammatory changes surrounding the gallbladder and extending into the adjacent liver again suspicious for acute cholecystitis with localized hepatic inflammatory change. Correlate with pending ultrasound exam  Upper abdominal lymphadenopathy which appears necrotic and stable in appearance from the prior exam. Patient is scheduled for PET-CT according to outpatient clinic notes.   Stable enhancing right renal mass suspicious for renal cell carcinoma. By history the patient has met with urology.   Remainder of the exam is stable from the prior study.    05/12/2021 Imaging   EXAM: ULTRASOUND ABDOMEN LIMITED RIGHT UPPER QUADRANT  IMPRESSION: Stable changes in the gallbladder with cholelithiasis and gallbladder wall thickening. Negative sonographic Murphy's sign is elicited however. This may be in part due to underlying medications.   05/13/2021 Imaging   EXAM: MRI ABDOMEN WITHOUT AND WITH CONTRAST (INCLUDING MRCP)  IMPRESSION: 1. Motion degraded images. 2. Cholelithiasis. Suggestion of wall thickening in the setting of underdistention. Development of a pericholecystic liver lesion which is suspicious for extension of chronic cholecystitis. Direct extension of gallbladder carcinoma felt less likely, given absence of dominant gallbladder mass. 3. Upper abdominal necrotic adenopathy, present back to 04/03/2021. Outpatient PET may be informative to direct sampling. 4. Mild intra and extrahepatic biliary duct dilatation. Equivocal findings for distal common duct stone, given above limitations. If bilirubin is elevated, consider ERCP. 5. Interpolar right renal enhancing mass, consistent with renal cell carcinoma. 6. Mild edema within the anterior pararenal space. Correlate with pancreatic enzyme levels to exclude mild pancreatitis. 7. Right base airspace disease, new since yesterday's CT. Favor atelectasis.   05/18/2021 Procedure   Upper EUS, Dr. Benson Norway  Findings: Multiple malignant-appearing lymph nodes were visualized in the peripancreatic region. These were fifteen mm from the primary tumor. The largest measured 10 mm by 20 mm in maximal cross-sectional diameter. The nodes were oval, heterogenous and had well defined margins. Fine needle aspiration for cytology was performed. Color Doppler imaging was utilized prior to needle puncture to confirm a lack of significant vascular structures within the needle path. Five passes were made with the 25 gauge needle using a transgastric approach. A stylet was used. A cytotechnologist was present to  evaluate the adequacy of the specimen. Final cytology results are pending.   Multiple peripancreatic lymph nodes were identified. The largest one viewed measured approximatly 10 mm x 20 mm. Multiple passes with the 25 gauge FNA needle were performed through these lymph nodes and an adequate sampling was obtained. In the hilar region of the liver a 20 - 30 mm node/mass/abnormality was identified. It was hyperechoic. The gallbladder was not visualized after multiple attempts with repositioning and tracing the CBD proximally. There was an area near this lesion that was anechoic, potentially being a portion of the gallbladder. This area was adjacent or in the path of a potential FNA. With this uncertainty about the location of the gallbladder in relation to his lesion, an FNA was not performed.  Impression: - Multiple malignant-appearing lymph nodes were visualized in the peripancreatic region. Fine needle aspiration performed.   05/18/2021 Pathology Results   A. 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 Normal contour and caliber of the thoracic and abdominal aorta without evidence of aneurysm, dissection, or other acute aortic pathology. Moderate to severe mixed calcific atherosclerosis.  2. Scattered ground-glass and heterogeneous airspace opacities in the dependent lungs, nonspecific and most likely infectious or inflammatory. Given concern for new pulmonary nodule raised on prior CT angiogram, recommend follow-up CT in 3 months to  ensure resolution. 3. Mild centrilobular emphysema. 4. Redemonstrated contracted, heterogeneous gallbladder with an adjacent hypodense lesion of the right lobe of the liver, poorly assessed on this early arterial phase CT and better characterized by prior MR. 5. Interpolar mass of the midportion of the right kidney again noted, in keeping with renal cell carcinoma again better characterized by prior MR. 6. Colon is fluid-filled to the rectum, suggestive of diarrheal illness. 7. Coronary artery disease.   Aortic Atherosclerosis (ICD10-I70.0) and Emphysema (ICD10-J43.9).   06/27/2021 -  Chemotherapy   Patient is on Treatment Plan : BILIARY TRACT Cisplatin + Gemcitabine D1,8 q21d     06/27/2021 -  Chemotherapy   Patient is on Treatment Plan : BLADDER Durvalumab q28d        INTERVAL HISTORY:  Andre [name] is here for a follow up of metastatic adenocarcinoma. He was last seen by me on 06/15/21. He presents to the clinic accompanied by his daughter [name]. He reports his main complaint is the pain on his right side, which he rates 8-9/10 still. He endorses taking pain medication every 6 hours. He reports he is constipated. LaToya notes she was unaware of this, as she is not with him every day. She feels he is not eating enough to have a proper bowel movement.   All other systems were reviewed with the patient and are negative.  MEDICAL HISTORY:  Past Medical History:  Diagnosis Date   Arthritis    Coronary artery disease, non-occlusive    a. 06/2011: cath for abnormal exercise echo: Only 30-40% proximal RCA. Otherwise normal. b. 10/2015: cath for abnormal NST and atypical CP --> showed mild 30-40% stenosis in the prox-distal RCA   GERD (gastroesophageal reflux disease)    HA (headache)    Hypertension    Memory loss    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[place])    TIA (transient ischemic attack)    Weakness     SURGICAL HISTORY: Past Surgical History:  Procedure Laterality Date   CARDIAC  CATHETERIZATION  January 2013   Proximal RCA 30-40%. Otherwise normal.   CARDIAC CATHETERIZATION N/A 10/18/2015   Procedure: Left Heart Cath and Coronary Angiography;  Surgeon: Jettie Booze, MD;  Location: [facility];  Service: Cardiovascular;  Laterality: N/A;   ESOPHAGOGASTRODUODENOSCOPY (EGD) WITH PROPOFOL N/A 05/18/2021   Procedure: ESOPHAGOGASTRODUODENOSCOPY (EGD) WITH PROPOFOL;  Surgeon: Carol Ada, MD;  Location: [facility];  Service: Endoscopy;  Laterality: N/A;   EUS Left 05/18/2021   Procedure: UPPER ENDOSCOPIC ULTRASOUND (EUS) LINEAR;  Surgeon: Carol Ada, MD;  Location: [place];  Service: Endoscopy;  Laterality: Left;   FINE NEEDLE ASPIRATION  05/18/2021   Procedure: FINE NEEDLE ASPIRATION (FNA) LINEAR;  Surgeon: Carol Ada, MD;  Location: [place];  Service: Endoscopy;;   NM MYOVIEW LTD  March 2015   LOW RISK. No scar or ischemia. Normal EF (55%). No RWMA   surgical excision of rectal cancer      I have reviewed the social history and family history with the patient and they are unchanged from previous note.  ALLERGIES:  has No Known Allergies.  MEDICATIONS:  Current Outpatient Medications  Medication Sig Dispense Refill   apixaban (ELIQUIS) 5 MG TABS tablet Take 1 tablet (5 mg total) by mouth 2 (two) times daily. 60 tablet  3   mirtazapine (REMERON) 15 MG tablet Take 1 tablet (15 mg total) by mouth at bedtime. 30 tablet 2   oxyCODONE (OXY IR/ROXICODONE) 5 MG immediate release tablet Take 1 tablet (5 mg total) by mouth every 4 (four) hours as needed for severe pain. 30 tablet 0   acetaminophen (TYLENOL) 325 MG tablet Take 2 tablets (650 mg total) by mouth every 6 (six) hours as needed for mild pain (or Fever >/= 101).     aspirin EC 81 MG tablet Take 81 mg by mouth daily. Swallow whole.     carvedilol (COREG) 3.125 MG tablet Take 1 tablet (3.125 mg total) by mouth 2 (two) times daily with a meal. 60 tablet 0   diclofenac Sodium (VOLTAREN) 1 % GEL  Apply 2 g topically 4 (four) times daily.     pantoprazole (PROTONIX) 40 MG tablet Take 1 tablet (40 mg total) by mouth daily. 30 tablet 1   traMADol (ULTRAM) 50 MG tablet TAKE 1 TABLET(50 MG) BY MOUTH EVERY 6 HOURS AS NEEDED 30 tablet 0   Current Facility-Administered Medications  Medication Dose Route Frequency Provider Last Rate Last Admin   0.9 %  sodium chloride infusion   Intravenous Continuous Truitt Merle, MD       Facility-Administered Medications Ordered in Other Visits  Medication Dose Route Frequency Provider Last Rate Last Admin   0.9 %  sodium chloride infusion   Intravenous Continuous Truitt Merle, MD 500 mL/hr at 06/27/21 0953 New Bag at 06/27/21 0953   enoxaparin (LOVENOX) injection 100 mg  100 mg Subcutaneous Once Truitt Merle, MD        PHYSICAL EXAMINATION: ECOG PERFORMANCE STATUS: 3 - Symptomatic, >50% confined to bed  Vitals:   06/27/21 0910  BP: 104/80  Pulse: 98  Resp: 17  SpO2: 100%   Wt Readings from Last 3 Encounters:  06/27/21 128 lb 11.2 oz (58.4 kg)  06/15/21 134 lb 12.8 oz (61.1 kg)  06/08/21 146 lb (66.2 kg)     GENERAL:alert, no distress and comfortable SKIN: skin color normal, no rashes or significant lesions EYES: normal, Conjunctiva are pink and non-injected, sclera clear  NEURO: alert & oriented x 3 with fluent speech  LABORATORY DATA:  I have reviewed the data as listed CBC Latest Ref Rng & Units 06/27/2021 06/15/2021 06/08/2021  WBC 4.0 - 10.5 K/uL 4.6 5.7 5.4  Hemoglobin 13.0 - 17.0 g/dL 14.3 15.3 12.6(L)  Hematocrit 39.0 - 52.0 % 43.3 45.7 37.4(L)  Platelets 150 - 400 K/uL 215 242 194     CMP Latest Ref Rng & Units 06/27/2021 06/15/2021 06/08/2021  Glucose 70 - 99 mg/dL 96 97 83  BUN 8 - 23 mg/dL 12 11 5(L)  Creatinine 0.61 - 1.24 mg/dL 0.78 0.75 0.71  Sodium 135 - 145 mmol/L 135 135 135  Potassium 3.5 - 5.1 mmol/L 3.4(L) 4.0 4.1  Chloride 98 - 111 mmol/L 96(L) 100 101  CO2 22 - 32 mmol/L 27 30 27   Calcium 8.9 - 10.3 mg/dL 10.3 9.8 9.0   Total Protein 6.5 - 8.1 g/dL 8.2(H) 8.4(H) -  Total Bilirubin 0.3 - 1.2 mg/dL 1.7(H) 1.1 -  Alkaline Phos 38 - 126 U/L 324(H) 192(H) -  AST 15 - 41 U/L 34 31 -  ALT 0 - 44 U/L PENDING 16 -      RADIOGRAPHIC STUDIES: I have personally reviewed the radiological images as listed and agreed with the findings in the report. No results found.    No orders  of the defined types were placed in this encounter.  All questions were answered. The patient knows to call the clinic with any problems, questions or concerns. No barriers to learning was detected. The total time spent in the appointment was 40 minutes.     Truitt Merle, MD 06/27/2021   I, Wilburn Mylar, am acting as scribe for Truitt Merle, MD.   I have reviewed the above documentation for accuracy and completeness, and I agree with the above.

## 2021-06-29 ENCOUNTER — Other Ambulatory Visit: Payer: Self-pay

## 2021-06-29 NOTE — Progress Notes (Signed)
Mail pt his original DNR signed paperwork to the address on file today.

## 2021-07-02 ENCOUNTER — Other Ambulatory Visit: Payer: Self-pay | Admitting: Hematology

## 2021-07-02 DIAGNOSIS — C799 Secondary malignant neoplasm of unspecified site: Secondary | ICD-10-CM

## 2021-07-04 ENCOUNTER — Encounter: Payer: Self-pay | Admitting: Internal Medicine

## 2021-07-04 ENCOUNTER — Ambulatory Visit (INDEPENDENT_AMBULATORY_CARE_PROVIDER_SITE_OTHER): Payer: Medicare Other | Admitting: Internal Medicine

## 2021-07-04 ENCOUNTER — Encounter: Payer: Self-pay | Admitting: Nurse Practitioner

## 2021-07-04 ENCOUNTER — Other Ambulatory Visit: Payer: Self-pay

## 2021-07-04 ENCOUNTER — Inpatient Hospital Stay (HOSPITAL_BASED_OUTPATIENT_CLINIC_OR_DEPARTMENT_OTHER): Payer: Medicare Other | Admitting: Nurse Practitioner

## 2021-07-04 VITALS — BP 109/71 | Temp 98.2°F

## 2021-07-04 DIAGNOSIS — Z515 Encounter for palliative care: Secondary | ICD-10-CM | POA: Diagnosis not present

## 2021-07-04 DIAGNOSIS — R64 Cachexia: Secondary | ICD-10-CM | POA: Diagnosis not present

## 2021-07-04 DIAGNOSIS — K750 Abscess of liver: Secondary | ICD-10-CM

## 2021-07-04 DIAGNOSIS — C772 Secondary and unspecified malignant neoplasm of intra-abdominal lymph nodes: Secondary | ICD-10-CM | POA: Diagnosis not present

## 2021-07-04 DIAGNOSIS — K219 Gastro-esophageal reflux disease without esophagitis: Secondary | ICD-10-CM | POA: Diagnosis not present

## 2021-07-04 DIAGNOSIS — K760 Fatty (change of) liver, not elsewhere classified: Secondary | ICD-10-CM | POA: Diagnosis not present

## 2021-07-04 DIAGNOSIS — G893 Neoplasm related pain (acute) (chronic): Secondary | ICD-10-CM | POA: Diagnosis not present

## 2021-07-04 DIAGNOSIS — I10 Essential (primary) hypertension: Secondary | ICD-10-CM | POA: Diagnosis not present

## 2021-07-04 DIAGNOSIS — K5730 Diverticulosis of large intestine without perforation or abscess without bleeding: Secondary | ICD-10-CM | POA: Diagnosis not present

## 2021-07-04 DIAGNOSIS — Z8673 Personal history of transient ischemic attack (TIA), and cerebral infarction without residual deficits: Secondary | ICD-10-CM | POA: Diagnosis not present

## 2021-07-04 DIAGNOSIS — Z7189 Other specified counseling: Secondary | ICD-10-CM

## 2021-07-04 DIAGNOSIS — K8060 Calculus of gallbladder and bile duct with cholecystitis, unspecified, without obstruction: Secondary | ICD-10-CM | POA: Diagnosis not present

## 2021-07-04 DIAGNOSIS — Z87891 Personal history of nicotine dependence: Secondary | ICD-10-CM

## 2021-07-04 DIAGNOSIS — Z79899 Other long term (current) drug therapy: Secondary | ICD-10-CM | POA: Diagnosis not present

## 2021-07-04 DIAGNOSIS — C801 Malignant (primary) neoplasm, unspecified: Secondary | ICD-10-CM | POA: Diagnosis not present

## 2021-07-04 DIAGNOSIS — N2889 Other specified disorders of kidney and ureter: Secondary | ICD-10-CM | POA: Diagnosis not present

## 2021-07-04 DIAGNOSIS — Z85048 Personal history of other malignant neoplasm of rectum, rectosigmoid junction, and anus: Secondary | ICD-10-CM | POA: Diagnosis not present

## 2021-07-04 DIAGNOSIS — I700 Atherosclerosis of aorta: Secondary | ICD-10-CM | POA: Diagnosis not present

## 2021-07-04 DIAGNOSIS — Z66 Do not resuscitate: Secondary | ICD-10-CM

## 2021-07-04 DIAGNOSIS — R634 Abnormal weight loss: Secondary | ICD-10-CM | POA: Diagnosis not present

## 2021-07-04 DIAGNOSIS — I2510 Atherosclerotic heart disease of native coronary artery without angina pectoris: Secondary | ICD-10-CM | POA: Diagnosis not present

## 2021-07-04 DIAGNOSIS — C3412 Malignant neoplasm of upper lobe, left bronchus or lung: Secondary | ICD-10-CM | POA: Diagnosis not present

## 2021-07-04 DIAGNOSIS — C799 Secondary malignant neoplasm of unspecified site: Secondary | ICD-10-CM

## 2021-07-04 DIAGNOSIS — Z7982 Long term (current) use of aspirin: Secondary | ICD-10-CM | POA: Diagnosis not present

## 2021-07-04 DIAGNOSIS — J432 Centrilobular emphysema: Secondary | ICD-10-CM | POA: Diagnosis not present

## 2021-07-04 DIAGNOSIS — C778 Secondary and unspecified malignant neoplasm of lymph nodes of multiple regions: Secondary | ICD-10-CM | POA: Diagnosis not present

## 2021-07-04 DIAGNOSIS — Z7901 Long term (current) use of anticoagulants: Secondary | ICD-10-CM | POA: Diagnosis not present

## 2021-07-04 NOTE — Progress Notes (Signed)
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 06/06/2021   HFrEF (heart failure with reduced ejection fraction) ([place]) 06/06/2021   Pneumonia 06/06/2021   SVT (supraventricular tachycardia) ([place]) 06/06/2021   History of colorectal cancer in 2009  06/06/2021   Sepsis ([place]) 05/13/2021   Acute cholecystitis 05/13/2021   Pulmonary embolism ([place]) 05/13/2021   RUQ pain 04/06/2021   Renal lesion 04/06/2021   Epigastric pain 03/28/2021   Pain due to onychomycosis of toenails of both feet 01/14/2021   Blurry vision, bilateral 12/31/2020   Nail disorder 12/31/2020   Left shoulder pain 02/11/2020   Right shoulder pain 02/11/2020   Vitamin B12 deficiency 12/17/2019   Mild cognitive impairment 12/17/2019   Peripheral neuropathy 12/17/2019   Vitamin D deficiency 12/01/2019   Patellar tendonitis of left knee 12/01/2019   Allergic rhinitis 10/07/2019   Dementia ([place]) 10/07/2019   Amnesia 09/10/2019   Right-sided nosebleed 08/07/2019   Leg cramping 08/07/2019   Chest pain 03/23/2019   Epistaxis 03/23/2019   Hyperlipidemia 12/24/2017   Alcohol use 12/24/2017   Non-compliance 12/24/2017   Degenerative disc disease, lumbar 11/15/2017   Left lumbar radiculopathy 08/02/2017   Atherosclerosis 06/03/2017   Low-level of literacy 05/12/2017   Osteoarthritis 04/20/2017   Type 2 diabetes mellitus without complication ([place]) 28/41/3244   Back pain 02/29/2016   Abnormal stress test 10/18/2015   Erectile dysfunction 02/25/2015   Cigarette smoker 02/10/2015   COPD GOLD 0 11/08/2014   Hemorrhoids 02/11/2014   Left pontine CVA ([place]) 01/06/2013   Headache 10/23/2011   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[place]) 10/23/2011   GERD  (gastroesophageal reflux disease) 10/23/2011   Nicotine addiction 10/23/2011   Alcohol abuse, in remission 10/23/2011   Cervical stenosis of spinal canal 10/23/2011   Essential hypertension, benign 07/10/2011     HPI:  5 YM with history of rectal cancer status post excision about 10 years ago, coronary artery disease, TIA presents for hospital follow-up of E. coli bacteremia secondary to possible liver abscess.  Patient had presented with diarrhea and rigors with CT showing necrotic abdominal lymph nodes, renal mass suspicious for cancer and cholecystitis.  MRCP showed chronic cholecystitis and cholelithiasis, necrotic adenopathy,  pericholecystic right liver lobe mass measuring 3.5X 3.2 cm new since 04/03/2021, mild intra and extrahepatic biliary duct dilatation.  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  Gallbladder was unable to be biopside.  Given the new  liver lesion in the setting of Ecoli bacteremia plan was to treat with 4 weeks of IV antibiotics and heme-onc follow-up.  Patient seen by Dr. Krista Blue  Feng(Heme-onc) outpatient, PET ordered to evaluate extent of disease. In the interim, patient was hospitalized 12/26-12/28 as he presented with right-sided chest pain.  CT showed groundglass opacities concerning for infection versus inflammation.  Antibiotics changed from ceftriaxone to cefepime(to treat pneumonia) and metronidazole, (EOT 06/09/2021). 07/04/21: Two daughters and son at bedside. Pt is laying on examination bed. They reported they plan on going to inpatient hospice today.     Review of Systems  All other systems reviewed and are negative.  Past Medical History:  Diagnosis Date   Arthritis    Coronary  artery disease, non-occlusive    a. 06/2011: cath for abnormal exercise echo: Only 30-40% proximal RCA. Otherwise normal. b. 10/2015: cath for abnormal NST and atypical CP --> showed mild 30-40% stenosis in the  prox-distal RCA   GERD (gastroesophageal reflux disease)    HA (headache)    Hypertension    Memory loss    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H[facility])    TIA (transient ischemic attack)    Weakness     Outpatient Medications Prior to Visit  Medication Sig Dispense Refill   acetaminophen (TYLENOL) 325 MG tablet Take 2 tablets (650 mg total) by mouth every 6 (six) hours as needed for mild pain (or Fever >/= 101).     apixaban (ELIQUIS) 5 MG TABS tablet Take 1 tablet (5 mg total) by mouth 2 (two) times daily. 60 tablet 3   carvedilol (COREG) 3.125 MG tablet Take 1 tablet (3.125 mg total) by mouth 2 (two) times daily with a meal. 60 tablet 0   diclofenac Sodium (VOLTAREN) 1 % GEL Apply 2 g topically 4 (four) times daily.     mirtazapine (REMERON) 15 MG tablet Take 1 tablet (15 mg total) by mouth at bedtime. 30 tablet 2   oxyCODONE (OXY IR/ROXICODONE) 5 MG immediate release tablet Take 1 tablet (5 mg total) by mouth every 4 (four) hours as needed for severe pain. 30 tablet 0   pantoprazole (PROTONIX) 40 MG tablet Take 1 tablet (40 mg total) by mouth daily. 30 tablet 1   prochlorperazine (COMPAZINE) 10 MG tablet TAKE 1 TABLET(10 MG) BY MOUTH EVERY 6 HOURS AS NEEDED FOR NAUSEA OR VOMITING 30 tablet 1   aspirin EC 81 MG tablet Take 81 mg by mouth daily. Swallow whole. (Patient not taking: Reported on 07/04/2021)     traMADol (ULTRAM) 50 MG tablet TAKE 1 TABLET(50 MG) BY MOUTH EVERY 6 HOURS AS NEEDED (Patient not taking: Reported on 07/04/2021) 30 tablet 0   Facility-Administered Medications Prior to Visit  Medication Dose Route Frequency Provider Last Rate Last Admin   enoxaparin (LOVENOX) injection 100 mg  100 mg Subcutaneous Once Truitt Merle, MD         No Known Allergies  Social History   Tobacco Use   Smoking status: Former    Packs/day: 0.50    Years: 50.00    Pack years: 25.00    Types: Cigarettes    Quit date: 05/01/2021    Years since quitting: 0.1   Smokeless tobacco: Never  Vaping Use   Vaping Use:  Never used  Substance Use Topics   Alcohol use: Yes    Alcohol/week: 7.0 standard drinks    Types: 7 Shots of liquor per week    Comment: stopped in 04/2021   Drug use: No    Family History  Problem Relation Age of Onset   Hypertension Mother    Cancer Mother        unknown type   Cancer Father        unknown type cancer   Liver disease Father     Objective:   Vitals:   07/04/21 0840  BP: 109/71  Temp: 98.2 F (36.8 C)  TempSrc: Temporal   There is no height or weight on file to calculate BMI.  Physical Exam Constitutional:      General: He is not in acute distress.    Appearance: He is not toxic-appearing.  HENT:     Head: Normocephalic and atraumatic.     Right Ear: External ear normal.  Left Ear: External ear normal.     Nose: No congestion or rhinorrhea.     Mouth/Throat:     Mouth: Mucous membranes are moist.     Pharynx: Oropharynx is clear.  Eyes:     Extraocular Movements: Extraocular movements intact.     Pupils: Pupils are equal, round, and reactive to light.     Comments: Scleral icterus  Cardiovascular:     Rate and Rhythm: Normal rate and regular rhythm.     Heart sounds: No murmur heard.   No friction rub. No gallop.  Pulmonary:     Effort: Pulmonary effort is normal.     Breath sounds: Normal breath sounds.  Abdominal:     General: Abdomen is flat. Bowel sounds are normal.     Palpations: Abdomen is soft.  Musculoskeletal:        General: No swelling. Normal range of motion.     Cervical back: Normal range of motion and neck supple.  Skin:    General: Skin is warm and dry.  Neurological:     General: No focal deficit present.     Mental Status: He is oriented to person, place, and time.  Psychiatric:        Mood and Affect: Mood normal.    Lab Results: Lab Results  Component Value Date   WBC 4.6 06/27/2021   HGB 14.3 06/27/2021   HCT 43.3 06/27/2021   MCV 89.8 06/27/2021   PLT 215 06/27/2021    Lab Results  Component  Value Date   CREATININE 0.78 06/27/2021   BUN 12 06/27/2021   NA 135 06/27/2021   K 3.4 (L) 06/27/2021   CL 96 (L) 06/27/2021   CO2 27 06/27/2021    Lab Results  Component Value Date   ALT 28 06/27/2021   AST 34 06/27/2021   ALKPHOS 324 (H) 06/27/2021   BILITOT 1.7 (H) 06/27/2021     Assessment & Plan:  #E coli bacteremia 2/2 intraabdominal infection(pericholecystic liver lesion) with mild intra and extrahepatic dilatation #Metastatic adenocarcinoma to abdominal LN and lung, gallbladder likely primary -Pt was initially discharged on 4 weeks of antibiotics with ceftriaxone and metronidazole(EOT 06/08/21) -Readmitted 12/26-12/28 presented with right sided chest pain and admitted for  for possible pneumonia. CT showed ground glass opacities suspicious for infectious vs inflammation. Ceftriaxone changed to cefepime. Antibiotics ended on 06/09/21. CT also noted hypodense lesion adjacent to right lobe of liver. As pt has received 4 weeks of antibiotics, felt possible abscess was approprietly treated. Imaging findings can lag with clinical improvement vs lesion represents malignancy.  -Seen by Dr. Krista Blue Feng(heme-onc), family made the decision to decline chemotherapy and urgent referral to palliative care made. PET was not felt to be necessary as pt is not receiving treatment.  -Today, daughters and son informed me that they plan to enroll in inpatient hospice this today . -Follow-up PRN    Laurice Record, MD [place]   07/04/21  8:42 AM

## 2021-07-04 NOTE — Progress Notes (Signed)
[facility]  Telephone:([phone]) [phone] [phone]) [phone]   Name: Andre [name] Date: 07/04/2021 MRN: [id number]  DOB: Nov 09, 1940  Patient Care Team: Biagio Borg, MD as PCP - General (Internal Medicine) Lincoln, P.A. as Consulting Physician (Ophthalmology) Marcial Pacas, MD as Consulting Physician (Neurology) Szabat, Darnelle Maffucci, [facility] as Pharmacist (Pharmacist) Truitt Merle, MD as Consulting Physician (Oncology) Royston Bake, RN as Oncology Nurse Navigator (Oncology) Pickenpack-Cousar, Carlena Sax, NP as Nurse Practitioner (Nurse Practitioner)    INTERVAL HISTORY: Andre [name] is a 81 y.o. male with metastatic adenocarcinoma likely gallbladder/rectal cancer, hypertension, diabetes type 2, osteoarthritis, SVT, and tobacco use.  Palliative ask to see for symptom management and goals of care.   SOCIAL HISTORY:     reports that he quit smoking about 2 months ago. His smoking use included cigarettes. He has a 25.00 pack-year smoking history. He has never used smokeless tobacco. He reports current alcohol use of about 7.0 standard drinks per week. He reports that he does not use drugs.  ADVANCE DIRECTIVES:  Patient does not have a documented advanced directive. Discussed at length and paperwork provided. He would like to come back to AD clinic and complete documents on 1/27. MOST form completed. States his daughter Andre [name] is he requested medical decision maker with back-up support from his daughter Andre [name] if Andre [name] is not available.   CODE STATUS: DNR  PAST MEDICAL HISTORY: Past Medical History:  Diagnosis Date   Arthritis    Coronary artery disease, non-occlusive    a. 06/2011: cath for abnormal exercise echo: Only 30-40% proximal RCA. Otherwise normal. b. 10/2015: cath for abnormal NST and atypical CP --> showed mild 30-40% stenosis in the prox-distal RCA   GERD (gastroesophageal reflux disease)    HA (headache)    Hypertension     Memory loss    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[name])    TIA (transient ischemic attack)    Weakness     ALLERGIES:  has No Known Allergies.  MEDICATIONS:  Current Outpatient Medications  Medication Sig Dispense Refill   acetaminophen (TYLENOL) 325 MG tablet Take 2 tablets (650 mg total) by mouth every 6 (six) hours as needed for mild pain (or Fever >/= 101).     apixaban (ELIQUIS) 5 MG TABS tablet Take 1 tablet (5 mg total) by mouth 2 (two) times daily. 60 tablet 3   carvedilol (COREG) 3.125 MG tablet Take 1 tablet (3.125 mg total) by mouth 2 (two) times daily with a meal. 60 tablet 0   diclofenac Sodium (VOLTAREN) 1 % GEL Apply 2 g topically 4 (four) times daily.     mirtazapine (REMERON) 15 MG tablet Take 1 tablet (15 mg total) by mouth at bedtime. 30 tablet 2   oxyCODONE (OXY IR/ROXICODONE) 5 MG immediate release tablet Take 1 tablet (5 mg total) by mouth every 4 (four) hours as needed for severe pain. 30 tablet 0   pantoprazole (PROTONIX) 40 MG tablet Take 1 tablet (40 mg total) by mouth daily. 30 tablet 1   prochlorperazine (COMPAZINE) 10 MG tablet TAKE 1 TABLET(10 MG) BY MOUTH EVERY 6 HOURS AS NEEDED FOR NAUSEA OR VOMITING 30 tablet 1   traMADol (ULTRAM) 50 MG tablet TAKE 1 TABLET(50 MG) BY MOUTH EVERY 6 HOURS AS NEEDED (Patient not taking: Reported on 07/04/2021) 30 tablet 0   No current facility-administered medications for this visit.    VITAL SIGNS: There were no vitals taken for this visit. There were no vitals  filed for this visit.  Estimated body mass index is 18.47 kg/m as calculated from the following:   Height as of 06/27/21: 5\' 10"  (1.778 m).   Weight as of 06/27/21: 128 lb 11.2 oz (58.4 kg).   PERFORMANCE STATUS (ECOG) : 4 - Bedbound   Physical Exam General: ill-appearing, cachectic  Cardiovascular:tachycardic, hypotensive Pulmonary: diminished bilaterally Abdomen: soft, nontender, + bowel sounds Extremities: no edema, no joint deformities Skin: no rashes, thin, muscle  wasting Neurological: Weakness, some confusion noted   IMPRESSION:  Andre [name] presents to the clinic today with his daughters Andre [name] and Andre [name]). He is in a wheelchair. Frail and weak appearing. Thin. Somnolent.   Daughter expressed concerns of patients significant decline over the past week. He has not eaten in over 5 days, continues to lose weight, appears uncomfortable as to be in pain, incontinent of urine and bowels, extremely weak requiring 3 person assist to get him out of the wheelchair into the recliner. Family also shares he has been showing signs of confusion and hallucinations. His children have been taking turns caring for him in the home however became concerned due to his decline and somewhat suffering state despite all efforts. Emotional support provided.   I was able to speak some with Andre [name]. His response to most questions is "I am tired".   We discussed at length his current illness and what it means in the larger context of Her on-going co-morbidities. Natural disease trajectory and expectations were discussed. I had an open and honest discussion with his family regarding his decline and rapidly approaching end-of-life. They verbalized understanding expressing they are all in agreement with focusing on his comfort and would like to discuss and consider hospice at this time.   Given Andre [name] decline and frailty education provided to his daughters that patient's prognosis is very limited weeks to days. He is appropriate for residential hospice placement if all siblings are in agreement. Family verbalized understanding and Andre [name] shares she and all of her siblings have discussed his condition and are in full agreement.   Education provided on outpatient hospice's goals and philosophy of care in addition to the referral process. Expectations at end-of-life discussed. Family understands all care will focus on his comfort for what time he does have left.   I was able to speak with  Andre Bender, RN [facility] Liaison) and discuss Andre [name]'s case with her. Andre [name] kindly came over to meet with myself, patient, and his family here at the cancer center to prevent having to send him to the ED. Patient was approved for [facility] admission. All paperwork and consents were completed by family. Patient was discharged directly to their facility via [facility] at 1435pm.    PLAN: All care to transition to comfort and end-of-life focus Detailed discussion with patient and family. Education provided on hospice support. They have requested consideration for [facility] acceptance to allow for ongoing support and symptom management.  Andre Bender, RN [facility] Liaison) was able to meet with myself, patient, and family. Patient approved and transported via [facility] to [facility] @ 1435. [facility] provided with DNR, Medical Necessity transport form, and AVS. Daughters present and plans to follow patient in their personal vehicle to the facility.   Time Total: 115 min.   Visit consisted of counseling and education dealing with the complex and emotionally intense issues of symptom management and palliative care in the setting of serious and potentially life-threatening illness.Greater than 50%  of this time was spent counseling and coordinating  care related to the above assessment and plan.  Signed by: Alda Lea, AGPCNP-BC [place]

## 2021-07-04 NOTE — Patient Instructions (Signed)
Patient will transfer to [facility] for end-of-life care.

## 2021-07-07 ENCOUNTER — Ambulatory Visit (HOSPITAL_COMMUNITY): Payer: Medicare Other

## 2021-07-08 ENCOUNTER — Other Ambulatory Visit: Payer: Medicare Other | Admitting: Licensed Clinical Social Worker

## 2021-07-11 ENCOUNTER — Encounter: Payer: Medicare Other | Admitting: Nurse Practitioner

## 2021-07-11 ENCOUNTER — Ambulatory Visit: Payer: Medicare Other

## 2021-07-11 ENCOUNTER — Ambulatory Visit: Payer: Medicare Other | Admitting: Hematology

## 2021-07-11 ENCOUNTER — Other Ambulatory Visit: Payer: Medicare Other

## 2021-07-13 DEATH — deceased

## 2021-07-18 ENCOUNTER — Other Ambulatory Visit: Payer: Self-pay | Admitting: Hematology

## 2021-07-18 DIAGNOSIS — C799 Secondary malignant neoplasm of unspecified site: Secondary | ICD-10-CM

## 2021-07-22 ENCOUNTER — Telehealth: Payer: Self-pay | Admitting: Internal Medicine

## 2021-07-22 NOTE — Telephone Encounter (Signed)
Left message for patient to call back to schedule Medicare Annual Wellness Visit   Last AWV  07/13/20  Please schedule at anytime with [facility] if patient calls the office back.    40 Minutes appointment   Any questions, please call me at [phone]

## 2021-08-23 ENCOUNTER — Other Ambulatory Visit: Payer: Self-pay | Admitting: Internal Medicine
